# Patient Record
Sex: Female | Born: 1955 | Race: Black or African American | Hispanic: No | Smoking: Former smoker
Health system: Southern US, Community
[De-identification: ages and names within clinical notes are randomized; demographics above are authoritative.]

## PROBLEM LIST (undated history)

## (undated) DIAGNOSIS — E785 Hyperlipidemia, unspecified: Secondary | ICD-10-CM

## (undated) DIAGNOSIS — I10 Essential (primary) hypertension: Secondary | ICD-10-CM

## (undated) DIAGNOSIS — E669 Obesity, unspecified: Secondary | ICD-10-CM

## (undated) DIAGNOSIS — R7401 Elevation of levels of liver transaminase levels: Secondary | ICD-10-CM

## (undated) DIAGNOSIS — R740 Nonspecific elevation of levels of transaminase and lactic acid dehydrogenase [LDH]: Secondary | ICD-10-CM

## (undated) DIAGNOSIS — T7840XA Allergy, unspecified, initial encounter: Secondary | ICD-10-CM

## (undated) DIAGNOSIS — U071 COVID-19: Secondary | ICD-10-CM

## (undated) HISTORY — PX: BREAST SURGERY: SHX581

## (undated) HISTORY — DX: Essential (primary) hypertension: I10

## (undated) HISTORY — DX: Elevation of levels of liver transaminase levels: R74.01

## (undated) HISTORY — DX: COVID-19: U07.1

## (undated) HISTORY — DX: Allergy, unspecified, initial encounter: T78.40XA

## (undated) HISTORY — DX: Nonspecific elevation of levels of transaminase and lactic acid dehydrogenase (ldh): R74.0

## (undated) HISTORY — DX: Hyperlipidemia, unspecified: E78.5

## (undated) HISTORY — DX: Obesity, unspecified: E66.9

## (undated) HISTORY — PX: ECTOPIC PREGNANCY SURGERY: SHX613

## (undated) NOTE — *Deleted (*Deleted)
[facility] Management Pharm[facility]  Name: Courtney Anderson  MRN: [id number] DOB: Apr 03, 1956   Chief Complaint/ HPI  Courtney Anderson,  68 y.o. , female presents for their Initial CCM visit with the clinical pharmacist via telephone.  PCP : Steele Sizer, MD Patient Care Team: Steele Sizer, MD as PCP - General (Family Medicine) Gustavus Bryant, LCSW as Social Worker (Licensed Clinical Social Worker) Lajean Manes, [facility] (Pharmacist)  Their chronic conditions include: Hypertension, Hyperlipidemia, Atrial Fibrillation and Vitamin d deficiency, Hyperthyroidism with goiter  ARF due to COVID 19  Office Visits: 09/06/20- Aura Dials, NP-  BP 146/90 olmesartan started, wound clinic referral   Consult Visit: ***  Allergies  Allergen Reactions  . Codeine     Medications: Outpatient Encounter Medications as of 09/08/2020  Medication Sig  . amiodarone (PACERONE) 200 MG tablet Take 1 tablet (200 mg total) by mouth daily.  Marland Kitchen apixaban (ELIQUIS) 5 MG TABS tablet Take 5 mg by mouth 2 (two) times daily.  Marland Kitchen aspirin 81 MG tablet Take 81 mg by mouth daily.  . Cholecalciferol (VITAMIN D) 50 MCG (2000 UT) tablet Take by mouth.  . Multiple Vitamin (MULTIVITAMIN) tablet Take 1 tablet by mouth daily.  Marland Kitchen nystatin (MYCOSTATIN/NYSTOP) powder Apply topically 3 (three) times daily.  Marland Kitchen olmesartan (BENICAR) 20 MG tablet Take 1 tablet (20 mg total) by mouth daily.  . rivaroxaban (XARELTO) 20 MG TABS tablet Take 1 tablet (20 mg total) by mouth daily with supper.   No facility-administered encounter medications on file as of 09/08/2020.    Wt Readings from Last 3 Encounters:  08/28/20 218 lb 3.2 oz (99 kg)  10/22/19 242 lb (109.8 kg)  01/11/19 200 lb (90.7 kg)    Current Diagnosis/Assessment:    Goals Addressed   None     {CHL HP Upstream Pharmacy Diagnosis/Assessment:[phone]} Hypertension   BP goal is:  {CHL HP UPSTREAM Pharmacist BP ranges:[phone]}  Office blood pressures are   BP Readings from Last 3 Encounters:  09/06/20 (!) 146/90  08/28/20 128/83  10/22/19 121/88   BMP Latest Ref Rng & Units 08/28/2020 08/27/2020 08/26/2020  Glucose 70 - 99 mg/dL - 952(W) 413(K)  BUN 8 - 23 mg/dL - 8 6(L)  Creatinine 4.40 - 1.00 mg/dL - 1.02(V) 2.53(G)  BUN/Creat Ratio 12 - 28 - - -  Sodium 135 - 145 mmol/L - 137 137  Potassium 3.5 - 5.1 mmol/L 4.0 3.4(L) 3.4(L)  Chloride 98 - 111 mmol/L - 107 105  CO2 22 - 32 mmol/L - 24 23  Calcium 8.9 - 10.3 mg/dL - 9.9 10.4(H)    Patient checks BP at home {CHL HP BP Monitoring [phone]} Patient home BP readings are ranging: ***  Patient has failed these meds in the past: *** Patient is currently {CHL Controlled/Uncontrolled:[phone]} on the following medications:  . Olmesartan 20 mg qd  We discussed {CHL HP Upstream Pharmacy discussion:[phone]}  Plan  Continue {CHL HP Upstream Pharmacy UYQIH:[id number]}     AFIB/RVR   Patient is currently rhythm controlled. Office heart rates are  Pulse Readings from Last 3 Encounters:  09/06/20 67  08/28/20 74  10/22/19 76    CHA2DS2-VASc Score =    The patient's score is based upon:   {This patient does not have a recorded CHADS2-VASc score.   Click here to calculate score.   Then [facility] your note.       :[phone]}     Patient has failed these meds in past: NA Patient  is currently query  controlled on the following medications:  . Xarelto 20 mg qd . Amiodarone 200 mg qd  We discussed:  New diagnosis while hospitalized for COVID.  Plan  Continue {CHL HP Upstream Pharmacy [phone]}     Osteopenia / Osteoporosis   Last DEXA Scan: Unavailable   T-Score femoral neck: ***  T-Score total hip: ***  T-Score lumbar spine: ***  T-Score forearm radius: ***  10-year probability of major osteoporotic fracture: ***  10-year probability of hip fracture: ***  Vit D, 25-Hydroxy  Date Value Ref Range Status  10/22/2019 26.4 (L) 30.0 - 100.0 ng/mL  Final    Comment:    Vitamin D deficiency has been defined by the Institute of Medicine and an Endocrine Society practice guideline as a level of serum 25-OH vitamin D less than 20 ng/mL (1,2). The Endocrine Society went on to further define vitamin D insufficiency as a level between 21 and 29 ng/mL (2). 1. IOM (Institute of Medicine). 2010. Dietary reference    intakes for calcium and D. [place]: The    [employer]. 2. Holick MF, Binkley [place], Bischoff-Ferrari HA, et al.    Evaluation, treatment, and prevention of vitamin D    deficiency: an Endocrine Society clinical practice    guideline. JCEM. 2011 Jul; 96(7):1911-30.   H/O hypercalcemia/hyperthyroidism treated with RAI 10/23/18   Patient {is;is not an osteoporosis candidate:23886}  Patient has failed these meds in past: *** Patient is currently {CHL Controlled/Uncontrolled:[id number]} on the following medications:  . ***  We discussed:  {Osteoporosis Counseling:23892}  Plan  Continue {CHL HP Upstream Pharmacy VHQIO:[id number]}     Medication Management   Pt uses [facility]  pharmacy for all medications Uses pill box? {Yes or If no, why not?:20788} Pt endorses ***% compliance  We discussed: {Pharmacy options:24294}  Plan  {US Pharmacy KGMW:10272}    Follow up: *** month phone visit  ***

## (undated) NOTE — *Deleted (*Deleted)
CRITICAL CARE PROGRESS NOTE    Name: Courtney Anderson MRN: [id number] DOB: [birth year]     LOS: 25   SUBJECTIVE FINDINGS & SIGNIFICANT EVENTS    Patient description:  Reason for Consult: Severe respiratory distress and patient with COVID-19 pneumonia. Referring Physician: Irena Cords, MD  Courtney Anderson is an 7 y.o. female.  HPI: Patient is a 34 year old female with history noted below, who was admitted to [facility] on 2020-07-24 brought in via EMS as a "code sepsis".  Patient subsequently tested positive for COVID-19.  She presented with oxygen saturations of 69% on room air and had to be placed on nonrebreather mask to achieve oxygen saturations in the 90s.  She was subsequently admitted to the Covid unit for management of COVID-19 pneumonia.  She was noted to have acute kidney injury she was treated with remdesivir, zinc, vitamin C and antitussives.  She was placed empirically on Rocephin and azithromycin.  She continued to have increased FiO2 requirements.  She was refusing to self prone.  She had atrial fibrillation and was seen by cardiology to assist in management.   07/31/20- patient is on 90% FiO2, will modify meds as patient is on metoprolol and levophed as well as cardizem.  Will d/c propofol to improve SBP and remove arythmogenic vasopressor if able.  Plan to have CVP trending and diuresis today.   Goals of care discussion today - patient made DNR per sister and son of patient. I had 2 separate phone meetings with son and separate conversation with sister today to explain patient is worse with shock and renal failure requiring multiple pressors.   08/01/20- patient remains critically ill. FiO2 has been weaned down to 80% today.  08/03/20- patient remains critically ill shes being proned, weaning FiO2 as able.   08/04/20- patient with no interval changes continue with weaning and proning protocol.  Continue weaning FiO2 and PEEP as to optimize for SBT 08/06/20- patient is weaned down 40%FiO2 today, will attempt awakening trial with SBT, I spoke to son Mr Lorenda Hatchet today, he appreciates update and does not want Elink video view at this time.  08/07/20- patient is requiring 50%FiO2 today 08/08/20- Continuing to wean vent. FiO2 is 28%, will trial SBT today 08/09/20-tachypneic and hypertensive with SBT. Asynchronous.FiO2 back up to 50% 10/7 severeresp failure, attempt SAT/SBT 10/7 extubated to biPAP 10/8 Tachycardic , more confused,patient re-intubated ETT 8.0 10/9 remains on vent 10/11 severe resp failure on vent 10/12 failed weaning trial 10/12 successfully extubated 10/14 TRANSFER TO TRH 10/15- passed speech and swallow but then had aspiration event. Despite this we were able towean her HFNC and plan to transition to nasal canula.  She is high risk for aspiration still.  Wounds around torso - moisture associated and torso/abdomen/inguinal/sacral/intertriginous.  Will reconsult wound care and will consider PICC for TPN. Will discuss with family PEG/OG/TPN.***   Lines/tubes : Airway 7.5 mm (Active)  Secured at (cm) 23 cm 07/31/20 0806  Measured From Lips 07/31/20 0806  Secured Location Center 07/31/20 0806  Secured By [employer] 07/31/20 0806  Tube Holder Repositioned Yes 07/31/20 0806  Cuff Pressure (cm H2O) 26 cm H2O 07/31/20 0806  Site Condition Dry 07/31/20 0806     CVC Triple Lumen 07/30/20 Left Internal jugular (Active)  Indication for Insertion or Continuance of Line Vasoactive infusions 07/30/20 2010  Site Assessment Clean;Dry;Intact 07/30/20 2010  Proximal Lumen Status Infusing 07/30/20 2010  Medial Lumen Status Infusing 07/30/20 2010  Distal Lumen Status Infusing 07/30/20 2010  Dressing Type Transparent;Occlusive 07/30/20 2010  Dressing Status Clean;Dry;Intact 07/30/20 2010   Line Care Connections checked and tightened 07/30/20 2010  Dressing Change Due 08/06/20 07/30/20 2010     NG/OG Tube O[facility] mouth Xray Documented cm marking at nare/ corner of mouth 75 cm (Active)  Cm Marking at Nare/Corner of Mouth (if applicable) 75 cm 07/30/20 2010  Site Assessment Clean;Dry;Intact 07/30/20 2010  Ongoing Placement Verification No change in cm markings or external length of tube from initial placement;No acute changes, not attributed to clinical condition;No change in respiratory status;Xray 07/30/20 2010  Status Suction-low intermittent 07/30/20 2010  Amount of suction 80 mmHg 07/30/20 2010  Drainage Appearance Manson Passey;Coffee ground 07/30/20 2010     Urethral Catheter Awanda Mink Double-lumen;Latex 16 Fr. (Active)  Indication for Insertion or Continuance of Catheter Unstable critically ill patients first 24-48 hours (See Criteria) 07/31/20 0752  Site Assessment Clean;Intact 07/30/20 2000  Catheter Maintenance Bag below level of bladder;Catheter secured;Drainage bag/tubing not touching floor;Insertion date on drainage bag;No dependent loops;Seal intact 07/30/20 2000  Collection Container Standard drainage bag 07/30/20 2000  Securement Method Securing device (Describe) 07/30/20 2000  Urinary Catheter Interventions (if applicable) Unclamped 07/30/20 2000  Output (mL) 75 mL 07/31/20 0600    Microbiology/Sepsis markers: Results for orders placed or performed during the hospital encounter of 07/24/20  Urine culture     Status: Abnormal   Collection Time: 07/24/20  9:12 AM   Specimen: Urine, Random  Result Value Ref Range Status   Specimen Description   Final    URINE, RANDOM Performed at [facility], [address] [place], [place] [address]    Special Requests   Final    NONE Performed at [facility], [address], [place], [place] [address]    Culture (A)  Final    <10,000 COLONIES/mL >=100,000 COLONIES/mL Performed at [facility], [address] [place], [place] [address]    Report Status 07/25/2020 FINAL  Final  Blood culture (routine single)     Status: None   Collection Time: 07/24/20  9:14 AM   Specimen: BLOOD LEFT ARM  Result Value Ref Range Status   Specimen Description BLOOD LEFT ARM  Final   Special Requests   Final    BOTTLES DRAWN AEROBIC AND ANAEROBIC Blood Culture adequate volume   Culture   Final    NO GROWTH 5 DAYS Performed at [facility], [address] [place], [place] [address]    Report Status 07/29/2020 FINAL  Final  SARS Coronavirus 2 by RT PCR (hospital order, performed in [facility] hospital lab) Nasopharyngeal Nasopharyngeal Swab     Status: Abnormal   Collection Time: 07/24/20  9:16 AM   Specimen: Nasopharyngeal Swab  Result Value Ref Range Status   SARS Coronavirus 2 POSITIVE (A) NEGATIVE Final    Comment: RESULT CALLED TO, READ BACK BY AND VERIFIED WITH:  SAMANTHA HAMILTON AT 1049 07/24/20 SDR (NOTE) SARS-CoV-2 target nucleic acids are DETECTED  SARS-CoV-2 RNA is generally detectable in upper respiratory specimens  during the acute phase of infection.  Positive results are indicative  of the presence of the identified virus, but do not rule out bacterial infection or co-infection with other pathogens not detected by the test.  Clinical correlation with patient history and  other diagnostic information is necessary to determine patient infection status.  The expected result is negative.  Fact Sheet for Patients:   BoilerBrush.com.cy   Fact Sheet for Healthcare Providers:  https://pope.com/    This test is not yet approved or cleared by the [place] and  has been authorized for detection and/or diagnosis of SARS-CoV-2 by FDA under an Emergency Use Authorization (EUA).  This EUA will remain in effect (meaning thi s test can be used) for the duration of  the COVID-19 declaration under Section  564(b)(1) of the Act, 21 U.S.C. section 360-bbb-3(b)(1), unless the authorization is terminated or revoked sooner.  Performed at [facility], [address] [place], [place] [address]   MRSA PCR Screening     Status: None   Collection Time: 07/30/20 11:08 AM   Specimen: Nasopharyngeal  Result Value Ref Range Status   MRSA by PCR NEGATIVE NEGATIVE Final    Comment:        The GeneXpert MRSA Assay (FDA approved for NASAL specimens only), is one component of a comprehensive MRSA colonization surveillance program. It is not intended to diagnose MRSA infection nor to guide or monitor treatment for MRSA infections. Performed at [facility], [address] [place], [place] [address]   Culture, respiratory (non-expectorated)     Status: None   Collection Time: 08/01/20 10:46 AM   Specimen: Tracheal Aspirate; Respiratory  Result Value Ref Range Status   Specimen Description   Final    TRACHEAL ASPIRATE Performed at [facility] [address] [place], [place] [address]    Special Requests   Final    NONE Performed at [facility], [address] [place], [place] [address]    Gram Stain NO WBC SEEN NO ORGANISMS SEEN   Final   Culture   Final    RARE Normal respiratory flora-no Staph aureus or Pseudomonas seen Performed at [facility], [address] [place], [place] [address]    Report Status 08/03/2020 FINAL  Final    Anti-infectives:  Anti-infectives (From admission, onward)   Start     Dose/Rate Route Frequency Ordered Stop   08/18/20 0100  Ampicillin-Sulbactam (UNASYN) 3 g in sodium chloride 0.9 % 100 mL IVPB        3 g 200 mL/hr over 30 Minutes Intravenous Every 6 hours 08/18/20 0004     08/10/20 1600  fluconazole (DIFLUCAN) IVPB 200 mg        200 mg 100 mL/hr over 60 Minutes Intravenous Every 24 hours 08/10/20 1304 08/17/20 1755   08/01/20 1200  ceFEPIme (MAXIPIME) 2 g in sodium chloride 0.9 % 100 mL IVPB   Status:  Discontinued        2 g 200 mL/hr over 30 Minutes Intravenous Every 12 hours 08/01/20 1026 08/03/20 1014   07/25/20 1000  azithromycin (ZITHROMAX) 500 mg in sodium chloride 0.9 % 250 mL IVPB  Status:  Discontinued        500 mg 250 mL/hr over 60 Minutes Intravenous Every 24 hours 07/24/20 1108 07/24/20 1317   07/25/20 1000  remdesivir 100 mg in sodium chloride 0.9 % 100 mL IVPB       "Followed by" Linked Group Details   100 mg 200 mL/hr over 30 Minutes Intravenous Daily 07/24/20 1112 07/28/20 1544   07/24/20 1300  fluconazole (DIFLUCAN) tablet 100 mg  Status:  Discontinued        100 mg Oral Daily 07/24/20 1251 07/25/20 1522   07/24/20 1200  remdesivir 200 mg in sodium chloride 0.9% 250 mL IVPB       "Followed by" Linked Group Details   200 mg 580 mL/hr  over 30 Minutes Intravenous Once 07/24/20 1112 07/24/20 1424   07/24/20 1115  cefTRIAXone (ROCEPHIN) 2 g in sodium chloride 0.9 % 100 mL IVPB  Status:  Discontinued        2 g 200 mL/hr over 30 Minutes Intravenous Every 24 hours 07/24/20 1108 07/24/20 1113   07/24/20 1000  cefTRIAXone (ROCEPHIN) 2 g in sodium chloride 0.9 % 100 mL IVPB  Status:  Discontinued        2 g 200 mL/hr over 30 Minutes Intravenous Every 24 hours 07/24/20 0947 07/28/20 1438   07/24/20 1000  azithromycin (ZITHROMAX) 500 mg in sodium chloride 0.9 % 250 mL IVPB  Status:  Discontinued        500 mg 250 mL/hr over 60 Minutes Intravenous Every 24 hours 07/24/20 0947 07/28/20 1438       Consults: Treatment Team:  Alwyn Pea, MD Laurier Nancy, MD Marcina Millard, MD     PAST MEDICAL HISTORY   Past Medical History:  Diagnosis Date  . Allergy   . Elevated transaminase level   . Hyperlipidemia   . Hypertension   . Obesity      SURGICAL HISTORY   Past Surgical History:  Procedure Laterality Date  . BREAST SURGERY    . ECTOPIC PREGNANCY SURGERY       FAMILY HISTORY   Family History  Problem Relation Age of Onset  .  Hypertension Father   . Stroke Father   . Hypertension Mother   . Arthritis Mother   . Aneurysm Sister   . Hypertension Sister   . Hypertension Brother   . Hypertension Sister   . Hypertension Brother      SOCIAL HISTORY   Social History   Tobacco Use  . Smoking status: Former Games developer  . Smokeless tobacco: Never Used  Substance Use Topics  . Alcohol use: Yes    Alcohol/week: 0.0 standard drinks    Comment: on occasion  . Drug use: No     MEDICATIONS   Current Medication:  Current Facility-Administered Medications:  .  0.9 %  sodium chloride infusion, , Intravenous, PRN, Erin Fulling, MD, Stopped at 08/12/20 1532 .  acetaminophen (TYLENOL) tablet 650 mg, 650 mg, Oral, Q6H PRN, 650 mg at 08/09/20 2127 **OR** acetaminophen (TYLENOL) suppository 650 mg, 650 mg, Rectal, Q6H PRN, Agbata, Tochukwu, MD .  amiodarone (NEXTERONE PREMIX) 360-4.14 MG/200ML-% (1.8 mg/mL) IV infusion, 30 mg/hr, Intravenous, Continuous, Harlon Ditty D, NP, Last Rate: 16.67 mL/hr at 08/18/20 0625, 30 mg/hr at 08/18/20 0625 .  Ampicillin-Sulbactam (UNASYN) 3 g in sodium chloride 0.9 % 100 mL IVPB, 3 g, Intravenous, Q6H, Hall, Scott A, RPH, Last Rate: 200 mL/hr at 08/18/20 0757, 3 g at 08/18/20 0757 .  ascorbic acid (VITAMIN C) tablet 500 mg, 500 mg, Per Tube, Daily, Camree Wigington, MD, 500 mg at 08/15/20 0902 .  aspirin chewable tablet 81 mg, 81 mg, Per Tube, Daily, Karna Christmas, Jaquille Kau, MD, 81 mg at 08/15/20 0902 .  chlorhexidine (PERIDEX) 0.12 % solution 15 mL, 15 mL, Mouth Rinse, BID, Kasa, Kurian, MD, 15 mL at 08/18/20 0119 .  Chlorhexidine Gluconate Cloth 2 % PADS 6 each, 6 each, Topical, Daily, Erin Fulling, MD, 6 each at 08/18/20 0119 .  cholecalciferol (VITAMIN D3) tablet 2,000 Units, 2,000 Units, Per Tube, Daily, Vida Rigger, MD, 2,000 Units at 08/15/20 0902 .  digoxin (LANOXIN) tablet 0.25 mg, 0.25 mg, Oral, Daily, Amery, Sahar, MD .  famotidine (PEPCID) IVPB 20 mg premix, 20 mg, Intravenous,  Jaynie Bream, MD, Last Rate: 100 mL/hr at 08/17/20 2117, 20 mg at 08/17/20 2117 .  feeding supplement (ENSURE ENLIVE / ENSURE PLUS) liquid 237 mL, 237 mL, Oral, TID BM, Kasa, Kurian, MD .  guaiFENesin-dextromethorphan (ROBITUSSIN DM) 100-10 MG/5ML syrup 10 mL, 10 mL, Oral, Q4H PRN, Agbata, Tochukwu, MD .  haloperidol lactate (HALDOL) injection 5 mg, 5 mg, Intravenous, Q6H PRN, Irena Cords V, MD, 5 mg at 08/10/20 2338 .  heparin ADULT infusion 100 units/mL (25000 units/259mL sodium chloride 0.45%), 1,350 Units/hr, Intravenous, Continuous, Amery, Sahar, MD, Last Rate: 13.5 mL/hr at 08/17/20 2250, 1,350 Units/hr at 08/17/20 2250 .  HYDROcodone-acetaminophen (NORCO/VICODIN) 5-325 MG per tablet 1 tablet, 1 tablet, Oral, Q6H PRN, Jimmye Norman, NP, 1 tablet at 08/08/20 1730 .  insulin aspart (novoLOG) injection 0-9 Units, 0-9 Units, Subcutaneous, Q4H, Eugenie Norrie, NP, 1 Units at 08/18/20 0753 .  magnesium sulfate IVPB 2 g 50 mL, 2 g, Intravenous, Once, Harlon Ditty D, NP .  MEDLINE mouth rinse, 15 mL, Mouth Rinse, q12n4p, Kasa, Kurian, MD .  metoprolol tartrate (LOPRESSOR) injection 2.5-5 mg, 2.5-5 mg, Intravenous, Q2H PRN, Harlon Ditty D, NP, 5 mg at 08/14/20 1434 .  multivitamin with minerals tablet 1 tablet, 1 tablet, Per Tube, Daily, Vida Rigger, MD, 1 tablet at 08/15/20 0902 .  nystatin (MYCOSTATIN/NYSTOP) topical powder, , Topical, TID, Hughie Closs, MD, Given at 08/17/20 2120 .  ondansetron (ZOFRAN) tablet 4 mg, 4 mg, Oral, Q6H PRN **OR** ondansetron (ZOFRAN) injection 4 mg, 4 mg, Intravenous, Q6H PRN, Agbata, Tochukwu, MD, 4 mg at 08/11/20 1316 .  pneumococcal 23 valent vaccine (PNEUMOVAX-23) injection 0.5 mL, 0.5 mL, Intramuscular, Tomorrow-1000, Pahwani, Ravi, MD .  potassium chloride 10 mEq in 100 mL IVPB, 10 mEq, Intravenous, Q1 Hr x 6, Harlon Ditty D, NP, Last Rate: 100 mL/hr at 08/18/20 0802, 10 mEq at 08/18/20 0802 .  zinc sulfate capsule 220 mg, 220 mg,  Per Tube, Daily, Vida Rigger, MD, 220 mg at 08/15/20 0902    ALLERGIES   Codeine    REVIEW OF SYSTEMS    Unable to perform due to Sedation on MV  PHYSICAL EXAMINATION   Vital Signs: Temp:  [96.5 F (35.8 C)-97.8 F (36.6 C)] 97 F (36.1 C) (10/15 0400) Pulse Rate:  [84-119] 94 (10/15 0630) Resp:  [17-35] 25 (10/15 0630) BP: (137-169)/(79-103) 143/86 (10/15 0600) SpO2:  [82 %-100 %] 100 % (10/15 0630) FiO2 (%):  [80 %-90 %] 80 % (10/15 0400)  GENERAL:intubated on MV age appropriate HEAD: Normocephalic, atraumatic.  EYES: Pupils equal, round, reactive to light.  No scleral icterus.  MOUTH: Moist mucosal membrane. NECK: Supple. No thyromegaly. No nodules. No JVD.  PULMONARY: crackles at bases difficult to appreciate due to MV CARDIOVASCULAR: S1 and S2. Regular rate and rhythm. No murmurs, rubs, or gallops.  GASTROINTESTINAL: Soft, nontender, non-distended. No masses. Positive bowel sounds. No hepatosplenomegaly.  MUSCULOSKELETAL: No swelling, clubbing, or edema.  NEUROLOGIC: Mild distress due to acute illness SKIN:intact,warm,dry   PERTINENT DATA     Infusions: . sodium chloride Stopped (08/12/20 1532)  . amiodarone 30 mg/hr (08/18/20 0625)  . ampicillin-sulbactam (UNASYN) IV 3 g (08/18/20 0757)  . famotidine (PEPCID) IV 20 mg (08/17/20 2117)  . heparin 1,350 Units/hr (08/17/20 2250)  . magnesium sulfate bolus IVPB    . potassium chloride 10 mEq (08/18/20 0802)   Scheduled Medications: . vitamin C  500 mg Per Tube Daily  . aspirin  81 mg Per Tube Daily  . chlorhexidine  15 mL Mouth Rinse BID  . Chlorhexidine Gluconate Cloth  6 each Topical Daily  . cholecalciferol  2,000 Units Per Tube Daily  . digoxin  0.25 mg Oral Daily  . feeding supplement  237 mL Oral TID BM  . insulin aspart  0-9 Units Subcutaneous Q4H  . mouth rinse  15 mL Mouth Rinse q12n4p  . multivitamin with minerals  1 tablet Per Tube Daily  . nystatin   Topical TID  . pneumococcal 23  valent vaccine  0.5 mL Intramuscular Tomorrow-1000  . zinc sulfate  220 mg Per Tube Daily   PRN Medications: sodium chloride, acetaminophen **OR** acetaminophen, guaiFENesin-dextromethorphan, haloperidol lactate, HYDROcodone-acetaminophen, metoprolol tartrate, ondansetron **OR** ondansetron (ZOFRAN) IV Hemodynamic parameters:   Intake/Output: 10/14 0701 - 10/15 0700 In: 651.6 [P.O.:200; I.V.:280.3; IV Piggyback:171.2] Out: 2775 [Urine:2775]  Ventilator  Settings: FiO2 (%):  [80 %-90 %] 80 %     LAB RESULTS:  Basic Metabolic Panel: Recent Labs  Lab 08/14/20 0413 08/14/20 0413 08/15/20 0515 08/15/20 0515 08/16/20 0456 08/16/20 2222 08/17/20 0509 08/18/20 0441  NA 146*  --  141  --  139  --  137 138  K 3.5   < > 2.9*   < > 2.8*   < > 3.2* 2.7*  CL 106  --  100  --  97*  --  94* 93*  CO2 30  --  29  --  31  --  30 28  GLUCOSE 113*  --  100*  --  88  --  90 131*  BUN 17  --  12  --  10  --  8 8  CREATININE 0.92  --  0.97  --  0.88  --  0.89 0.91  CALCIUM 9.4  --  9.5  --  9.3  --  9.2 9.6  MG 1.8  --  1.9  --  1.8  --  1.9 1.5*  PHOS 2.8  --  2.5  --  2.1*  --  2.1* 3.3   < > = values in this interval not displayed.   Liver Function Tests: Recent Labs  Lab 08/14/20 0413 08/15/20 0515 08/16/20 0456 08/17/20 0509 08/18/20 0441  AST 23 27 26 28 28   ALT 39 35 29 30 29   ALKPHOS 55 60 58 66 69  BILITOT 0.7 1.1 1.1 1.4* 1.4*  PROT 6.0* 6.3* 6.0* 6.9 7.1  ALBUMIN 2.6* 2.7* 2.5* 2.7* 3.0*   No results for input(s): LIPASE, AMYLASE in the last 168 hours. No results for input(s): AMMONIA in the last 168 hours. CBC: Recent Labs  Lab 08/12/20 0523 08/12/20 0523 08/13/20 0405 08/13/20 0405 08/14/20 0413 08/15/20 0515 08/16/20 0616 08/17/20 0509 08/18/20 0441  WBC 7.4   < > 6.4   < > 7.1 7.2 6.6 6.4 10.1  NEUTROABS 5.8  --  4.7  --  5.8 5.1 4.6  --   --   HGB 10.0*   < > 10.5*   < > 10.8* 10.9* 11.4* 11.0* 11.8*  HCT 32.2*   < > 34.3*   < > 35.8* 34.9* 35.9*  33.9* 36.6  MCV 91.5   < > 90.7   < > 91.1 89.5 87.3 86.7 84.5  PLT 153   < > 144*   < > 158 155 167 163 138*   < > = values in this interval not displayed.   Cardiac Enzymes: No results for input(s): CKTOTAL, CKMB, CKMBINDEX, TROPONINI in the last 168 hours. BNP: Invalid input(s): POCBNP  CBG: Recent Labs  Lab 08/17/20 1828 08/17/20 2000 08/17/20 2359 08/18/20 0444 08/18/20 0730  GLUCAP 106* 106* 115* 136* 123*       IMAGING RESULTS:  Imaging: DG Chest Port 1 View  Result Date: 08/18/2020 CLINICAL DATA:  Aspiration EXAM: PORTABLE CHEST 1 VIEW COMPARISON:  08/11/2020 FINDINGS: Endotracheal tube is no longer present. Unchanged position of left IJ central venous catheter. Bilateral airspace opacities are unchanged. Small pleural effusions. IMPRESSION: Unchanged bilateral airspace opacities. Electronically Signed   By: Deatra Robinson M.D.   On: 08/18/2020 00:53    ASSESSMENT AND PLAN    -Multidisciplinary rounds held today Acute respiratory failure with hypoxia due to COVID-19 ARDS COVID-19 pneumonia mechanical ventilation ARDS protocol, 6 cc/kg of PBW Recruitment maneuvers as necessary Prone positioning 18/6 ratio Completed remdesivir Continue Rocephin and azithromycin for now  Atrial fibrillation with RVR Due to physiologic stress/hypoxia Hx: Hyperthyroidism treated with RAI Continue amiodarone and calcium channel blocker Cardiology following Continue cardiac monitoring Correct electrolytes as needed Heparin infusion  Severe intertrigo Multiple skin breakdown areas Wound care following  ID -continue IV abx as prescibed -follow up cultures  GI/Nutrition GI PROPHYLAXIS as indicated DIET-->TF's as tolerated Constipation protocol as indicated  ENDO - ICU hypoglycemic\Hyperglycemia protocol -check FSBS per protocol   ELECTROLYTES -follow labs as needed -replace as needed -pharmacy consultation   DVT/GI PRX ordered -SCDs  TRANSFUSIONS AS NEEDED  MONITOR FSBS ASSESS the need for LABS as needed   Critical care provider statement:    Critical care time (minutes):  33   Critical care time was exclusive of:  Separately billable procedures and treating other patients   Critical care was necessary to treat or prevent imminent or life-threatening deterioration of the following conditions:  Acute hypoxemic respiratory failure, multiple comoribid conditions.    Critical care was time spent personally by me on the following activities:  Development of treatment plan with patient or surrogate, discussions with consultants, evaluation of patient's response to treatment, examination of patient, obtaining history from patient or surrogate, ordering and performing treatments and interventions, ordering and review of laboratory studies and re-evaluation of patient's condition.  I assumed direction of critical care for this patient from another provider in my specialty: no    This document was prepared using Dragon voice recognition software and may include unintentional dictation errors.    Vida Rigger, M.D.  Division of Pulmonary & Critical Care Medicine  [facility]

---

## 2001-11-02 ENCOUNTER — Ambulatory Visit (HOSPITAL_COMMUNITY): Admission: RE | Admit: 2001-11-02 | Discharge: 2001-11-02 | Payer: Self-pay | Admitting: Pediatrics

## 2001-11-02 ENCOUNTER — Encounter: Payer: Self-pay | Admitting: Pediatrics

## 2002-09-14 ENCOUNTER — Ambulatory Visit (HOSPITAL_COMMUNITY): Admission: RE | Admit: 2002-09-14 | Discharge: 2002-09-14 | Payer: Self-pay | Admitting: Pediatrics

## 2002-09-14 ENCOUNTER — Encounter: Payer: Self-pay | Admitting: Pediatrics

## 2003-06-06 ENCOUNTER — Encounter: Payer: Self-pay | Admitting: Pediatrics

## 2003-06-06 ENCOUNTER — Ambulatory Visit (HOSPITAL_COMMUNITY): Admission: RE | Admit: 2003-06-06 | Discharge: 2003-06-06 | Payer: Self-pay | Admitting: Pediatrics

## 2003-12-19 ENCOUNTER — Ambulatory Visit (HOSPITAL_COMMUNITY): Admission: RE | Admit: 2003-12-19 | Discharge: 2003-12-19 | Payer: Self-pay | Admitting: Pediatrics

## 2004-08-13 ENCOUNTER — Ambulatory Visit (HOSPITAL_COMMUNITY): Admission: RE | Admit: 2004-08-13 | Discharge: 2004-08-13 | Payer: Self-pay | Admitting: Pediatrics

## 2004-10-08 ENCOUNTER — Ambulatory Visit (HOSPITAL_COMMUNITY): Admission: RE | Admit: 2004-10-08 | Discharge: 2004-10-08 | Payer: Self-pay | Admitting: Obstetrics and Gynecology

## 2006-10-23 ENCOUNTER — Ambulatory Visit: Payer: Self-pay | Admitting: Family Medicine

## 2007-10-26 ENCOUNTER — Ambulatory Visit: Payer: Self-pay | Admitting: Family Medicine

## 2008-10-26 ENCOUNTER — Ambulatory Visit: Payer: Self-pay | Admitting: Family Medicine

## 2008-11-21 ENCOUNTER — Emergency Department: Payer: Self-pay | Admitting: Emergency Medicine

## 2009-12-19 ENCOUNTER — Ambulatory Visit: Payer: Self-pay | Admitting: Family Medicine

## 2009-12-25 ENCOUNTER — Emergency Department: Payer: Self-pay | Admitting: Emergency Medicine

## 2015-04-12 ENCOUNTER — Other Ambulatory Visit: Payer: Self-pay

## 2015-04-12 MED ORDER — OLMESARTAN-AMLODIPINE-HCTZ 40-5-25 MG PO TABS: ORAL_TABLET | ORAL | Status: DC

## 2015-04-12 NOTE — Telephone Encounter (Signed)
Needs seen for further refills

## 2015-04-13 ENCOUNTER — Telehealth: Payer: Self-pay | Admitting: Unknown Physician Specialty

## 2015-04-13 NOTE — Telephone Encounter (Signed)
This was ordered and sent yesterday

## 2015-04-13 NOTE — Telephone Encounter (Signed)
Pt called states she needs a refill on Tribenzor. Pt states she is on  [employer] 12 hours shifts through her job and will not be able to schedule an appointment until the end of July or August. [facility] is [facility] [place]. Pt was last seen 09/2014. Please contact pt @ [phone] with any questions or concerns.

## 2015-05-15 ENCOUNTER — Telehealth: Payer: Self-pay | Admitting: Unknown Physician Specialty

## 2015-05-15 MED ORDER — OLMESARTAN-AMLODIPINE-HCTZ 40-5-25 MG PO TABS: ORAL_TABLET | ORAL | Status: DC

## 2015-05-15 NOTE — Telephone Encounter (Signed)
Pt called stated her medications are about to run out of her medications, wants to know if her medications can be called. Pt needs her BP medication. [facility] is [facility] in [place]. Thanks.

## 2015-05-15 NOTE — Telephone Encounter (Signed)
Routing to provider. Medication is Biomedical scientist, pharmacy is [facility] in [place], and practice partner number is [phone].

## 2015-05-15 NOTE — Telephone Encounter (Signed)
Pt stated her medication will run out Wednesday. Pt made an appt for 06/02/15 pt needs enough pills to last until the appt date.

## 2015-05-31 DIAGNOSIS — Z713 Dietary counseling and surveillance: Secondary | ICD-10-CM

## 2015-05-31 DIAGNOSIS — R740 Nonspecific elevation of levels of transaminase and lactic acid dehydrogenase [LDH]: Secondary | ICD-10-CM

## 2015-05-31 DIAGNOSIS — E785 Hyperlipidemia, unspecified: Secondary | ICD-10-CM

## 2015-05-31 DIAGNOSIS — I10 Essential (primary) hypertension: Secondary | ICD-10-CM

## 2015-05-31 DIAGNOSIS — R7401 Elevation of levels of liver transaminase levels: Secondary | ICD-10-CM

## 2015-06-02 ENCOUNTER — Encounter: Payer: Self-pay | Admitting: Unknown Physician Specialty

## 2015-06-02 ENCOUNTER — Ambulatory Visit (INDEPENDENT_AMBULATORY_CARE_PROVIDER_SITE_OTHER): Payer: BLUE CROSS/BLUE SHIELD | Admitting: Unknown Physician Specialty

## 2015-06-02 VITALS — BP 131/89 | HR 76 | Temp 98.1°F | Ht 65.6 in | Wt 260.2 lb

## 2015-06-02 DIAGNOSIS — I10 Essential (primary) hypertension: Secondary | ICD-10-CM

## 2015-06-02 LAB — LIPID PANEL PICCOLO, WAIVED
Chol/HDL Ratio Piccolo,Waive: 4.3 mg/dL
Cholesterol Piccolo, Waived: 182 mg/dL (ref <200)
HDL Chol Piccolo, Waived: 43 mg/dL — ABNORMAL LOW (ref >59)
LDL Chol Calc Piccolo Waived: 120 mg/dL — ABNORMAL HIGH (ref <100)
Triglycerides Piccolo,Waived: 93 mg/dL (ref <150)
VLDL Chol Calc Piccolo,Waive: 19 mg/dL (ref <30)

## 2015-06-02 LAB — BAYER DCA HB A1C WAIVED: HB A1C (BAYER DCA - WAIVED): 6 % (ref <7.0)

## 2015-06-02 LAB — MICROALBUMIN, URINE WAIVED
Creatinine, Urine Waived: 200 mg/dL (ref 10–300)
Microalb, Ur Waived: 10 mg/L (ref 0–19)
Microalb/Creat Ratio: <30 mg/g (ref <30)

## 2015-06-02 MED ORDER — OLMESARTAN-AMLODIPINE-HCTZ 40-5-25 MG PO TABS: ORAL_TABLET | ORAL | Status: DC

## 2015-06-02 NOTE — Progress Notes (Signed)
   BP 131/89 mmHg  Pulse 76  Temp(Src) 98.1 F (36.7 C)  Ht 5' 5.6" (1.666 m)  Wt 260 lb 3.2 oz (118.026 kg)  BMI 42.52 kg/m2  SpO2 97%  LMP  (LMP Unknown)   Subjective:    Patient ID: Courtney Anderson, female    DOB: [birth year], 59 y.o.   MRN: [id number]  HPI: Courtney Anderson is a 59 y.o. female  Chief Complaint  Patient presents with  . Hypertension    Relevant past medical, surgical, family and social history reviewed and updated as indicated. Interim medical history since our last visit reviewed. Allergies and medications reviewed and updated.  Hypertension This is a chronic (Recheck 131/84) problem. The problem is controlled (controlled outside the office). Pertinent negatives include no chest pain, headaches, palpitations, peripheral edema or shortness of breath. There are no compliance problems.    Obesity She is planning to lose weight.  Has lost before just by walking  Review of Systems  Respiratory: Negative for shortness of breath.   Cardiovascular: Negative for chest pain and palpitations.  Neurological: Negative for headaches.    Per HPI unless specifically indicated above     Objective:    BP 131/89 mmHg  Pulse 76  Temp(Src) 98.1 F (36.7 C)  Ht 5' 5.6" (1.666 m)  Wt 260 lb 3.2 oz (118.026 kg)  BMI 42.52 kg/m2  SpO2 97%  LMP  (LMP Unknown)  Wt Readings from Last 3 Encounters:  06/02/15 260 lb 3.2 oz (118.026 kg)  05/31/15 261 lb (118.389 kg)    Physical Exam  Constitutional: She is oriented to person, place, and time. She appears well-developed and well-nourished. No distress.  HENT:  Head: Normocephalic and atraumatic.  Eyes: Conjunctivae and lids are normal. Right eye exhibits no discharge. Left eye exhibits no discharge. No scleral icterus.  Cardiovascular: Normal rate, regular rhythm and normal heart sounds.   Pulmonary/Chest: Effort normal and breath sounds normal. No respiratory distress.  Abdominal: Normal appearance. There is no  splenomegaly or hepatomegaly.  Musculoskeletal: Normal range of motion.  Neurological: She is alert and oriented to person, place, and time.  Skin: Skin is intact. No rash noted. No pallor.  Psychiatric: She has a normal mood and affect. Her behavior is normal. Judgment and thought content normal.    No results found for this or any previous visit.    Assessment & Plan:   Problem List Items Addressed This Visit      Unprioritized   Hypertension - Primary    Continue present medications.        Relevant Medications   Olmesartan-Amlodipine-HCTZ (TRIBENZOR) 40-5-25 MG TABS   Other Relevant Orders   Lipid Panel Piccolo, Waived   Microalbumin, Urine Waived   Uric acid   Comprehensive metabolic panel   Morbid obesity    Will walk more and cut out sugar.        Relevant Orders   Bayer DCA Hb A1c Waived       Follow up plan: Return for physical October November.

## 2015-06-02 NOTE — Assessment & Plan Note (Signed)
Will walk more and cut out sugar.

## 2015-06-02 NOTE — Assessment & Plan Note (Signed)
Continue present medications. 

## 2015-06-03 LAB — COMPREHENSIVE METABOLIC PANEL
ALT: 49 IU/L — ABNORMAL HIGH (ref 0–32)
AST: 50 IU/L — ABNORMAL HIGH (ref 0–40)
Albumin/Globulin Ratio: 1.1 (ref 1.1–2.5)
Albumin: 4.2 g/dL (ref 3.5–5.5)
Alkaline Phosphatase: 96 IU/L (ref 39–117)
BUN/Creatinine Ratio: 19 (ref 9–23)
BUN: 13 mg/dL (ref 6–24)
Bilirubin Total: 0.3 mg/dL (ref 0.0–1.2)
CO2: 24 mmol/L (ref 18–29)
Calcium: 10.4 mg/dL — ABNORMAL HIGH (ref 8.7–10.2)
Chloride: 97 mmol/L (ref 97–108)
Creatinine, Ser: 0.69 mg/dL (ref 0.57–1.00)
GFR calc Af Amer: 110 mL/min/{1.73_m2} (ref >59)
GFR calc non Af Amer: 96 mL/min/{1.73_m2} (ref >59)
Globulin, Total: 3.7 g/dL (ref 1.5–4.5)
Glucose: 91 mg/dL (ref 65–99)
Potassium: 3.7 mmol/L (ref 3.5–5.2)
Sodium: 139 mmol/L (ref 134–144)
Total Protein: 7.9 g/dL (ref 6.0–8.5)

## 2015-06-03 LAB — URIC ACID: Uric Acid: 8.8 mg/dL — ABNORMAL HIGH (ref 2.5–7.1)

## 2015-06-05 ENCOUNTER — Encounter: Payer: Self-pay | Admitting: Unknown Physician Specialty

## 2015-06-13 ENCOUNTER — Other Ambulatory Visit: Payer: Self-pay | Admitting: Family Medicine

## 2016-02-08 ENCOUNTER — Telehealth: Payer: Self-pay | Admitting: Unknown Physician Specialty

## 2016-02-08 NOTE — Telephone Encounter (Signed)
Pt called needs refills on her blood pressure medication. Enough to last until her CPE on 03/22/16. [facility] [place]. Thanks.

## 2016-02-09 MED ORDER — OLMESARTAN-AMLODIPINE-HCTZ 40-5-25 MG PO TABS: ORAL_TABLET | ORAL | Status: DC

## 2016-03-22 ENCOUNTER — Encounter: Payer: BLUE CROSS/BLUE SHIELD | Admitting: Unknown Physician Specialty

## 2016-08-07 ENCOUNTER — Telehealth: Payer: Self-pay | Admitting: Unknown Physician Specialty

## 2016-08-07 NOTE — Telephone Encounter (Signed)
Tried calling patient and the first 3 numbers were wrong or disconnected. Her work number worked and a lady answered and stated that the patient was out on campus right now. She stated she did not know when she would see the patient again but she took the office name and phone number and stated she would have her call back when she could.

## 2016-08-07 NOTE — Telephone Encounter (Signed)
Called and let patient know that 90 day rx was at the pharmacy for her to pick up.

## 2016-08-07 NOTE — Telephone Encounter (Signed)
Pt called needs a refill on Tribenzor. [facility] [place]. Pt's CPE scheduled for 10/29/16. Pt needs enough to last until that appointment date.Thanks.

## 2016-08-07 NOTE — Telephone Encounter (Signed)
Called pharmacy because according to chart, patient should still have a refill. They stated that she did have a 90 day supply on file and they would go ahead and get it ready. This will be enough to get to December appointment so I will call the patient and let her know.

## 2016-09-13 ENCOUNTER — Encounter: Payer: Self-pay | Admitting: Unknown Physician Specialty

## 2016-10-09 ENCOUNTER — Telehealth: Payer: Self-pay | Admitting: Unknown Physician Specialty

## 2016-10-09 MED ORDER — OLMESARTAN-AMLODIPINE-HCTZ 40-5-25 MG PO TABS: ORAL_TABLET | ORAL | 1 refills | Status: DC

## 2016-10-09 NOTE — Telephone Encounter (Signed)
Please apologize to her.  I'm happy to fill her meds

## 2016-10-09 NOTE — Telephone Encounter (Signed)
Called and left patient a VM apologizing to her and I let her know that her medication was sent to the pharmacy for her.

## 2016-10-09 NOTE — Telephone Encounter (Signed)
Routing to provider  

## 2016-10-23 ENCOUNTER — Telehealth: Payer: Self-pay | Admitting: Unknown Physician Specialty

## 2016-10-23 NOTE — Telephone Encounter (Signed)
Called pharmacy because according to chart, patient should not be out of this medication. Pharmacy stated that they did receive the prescription on 10/09/16 and that the patient picked up a 20 day supply on 10/14/16 because that's what her insurance would allow. Pharmacy stated that the patient did have some still at the pharmacy ready to be picked up when she was due. Will call patient and let her know.

## 2016-10-23 NOTE — Telephone Encounter (Signed)
Pt called stated she needs an RX for Tribenzor sent to the pharmacy as she is almost out. [facility] [place]. Thanks.

## 2016-10-23 NOTE — Telephone Encounter (Signed)
Called and let patient know about medication.  

## 2016-10-25 ENCOUNTER — Encounter: Payer: BLUE CROSS/BLUE SHIELD | Admitting: Unknown Physician Specialty

## 2016-10-29 ENCOUNTER — Encounter: Payer: BLUE CROSS/BLUE SHIELD | Admitting: Unknown Physician Specialty

## 2016-11-05 ENCOUNTER — Telehealth: Payer: Self-pay | Admitting: Unknown Physician Specialty

## 2016-11-05 NOTE — Telephone Encounter (Signed)
Routing to provider  

## 2016-11-05 NOTE — Telephone Encounter (Signed)
Patient needs letter stating she has been taking Tribenzor for years and she cannot take the generic form.  She changed her ins coverage and beginning this year they require authorization for this medication.   She said the pharmacy faxed over something yesterday regarding this also.  Thanks Santiago Glad

## 2016-11-06 NOTE — Telephone Encounter (Signed)
OK to write letter but also due for a physical

## 2016-11-06 NOTE — Telephone Encounter (Signed)
Called pharmacy to see if this medication was needing a PA. The pharmacy tech I spoke with stated that this medication was picked up on 11/04/16 with no issues so she does not think it does. Will still type letter for patient and let her know it is ready for pick up.

## 2016-11-06 NOTE — Telephone Encounter (Signed)
Called and let patient know that letter was written and ready for pick up. Patient has a physical scheduled for 11/20/16.

## 2016-11-13 ENCOUNTER — Encounter: Payer: BLUE CROSS/BLUE SHIELD | Admitting: Unknown Physician Specialty

## 2016-11-20 ENCOUNTER — Encounter: Payer: Self-pay | Admitting: Unknown Physician Specialty

## 2016-11-21 ENCOUNTER — Encounter: Payer: Self-pay | Admitting: Unknown Physician Specialty

## 2016-12-02 ENCOUNTER — Telehealth: Payer: Self-pay | Admitting: Unknown Physician Specialty

## 2016-12-02 NOTE — Telephone Encounter (Signed)
PA Pending

## 2016-12-03 NOTE — Telephone Encounter (Signed)
Prior Authorization was approved. Pharmacy notified.

## 2016-12-19 ENCOUNTER — Ambulatory Visit (INDEPENDENT_AMBULATORY_CARE_PROVIDER_SITE_OTHER): Payer: BLUE CROSS/BLUE SHIELD | Admitting: Family Medicine

## 2016-12-19 VITALS — BP 152/85 | HR 91 | Ht 65.75 in | Wt 264.0 lb

## 2016-12-19 DIAGNOSIS — Z0000 Encounter for general adult medical examination without abnormal findings: Secondary | ICD-10-CM | POA: Diagnosis not present

## 2016-12-19 DIAGNOSIS — Z1159 Encounter for screening for other viral diseases: Secondary | ICD-10-CM

## 2016-12-19 DIAGNOSIS — Z1329 Encounter for screening for other suspected endocrine disorder: Secondary | ICD-10-CM | POA: Diagnosis not present

## 2016-12-19 DIAGNOSIS — E785 Hyperlipidemia, unspecified: Secondary | ICD-10-CM | POA: Diagnosis not present

## 2016-12-19 DIAGNOSIS — Z23 Encounter for immunization: Secondary | ICD-10-CM | POA: Diagnosis not present

## 2016-12-19 DIAGNOSIS — I10 Essential (primary) hypertension: Secondary | ICD-10-CM

## 2016-12-19 DIAGNOSIS — Z114 Encounter for screening for human immunodeficiency virus [HIV]: Secondary | ICD-10-CM

## 2016-12-19 MED ORDER — TRIAMCINOLONE ACETONIDE 0.1 % EX CREA: 1.0000 "application " | TOPICAL_CREAM | Freq: Two times a day (BID) | CUTANEOUS | 1 refills | Status: DC

## 2016-12-19 MED ORDER — OLMESARTAN-AMLODIPINE-HCTZ 40-10-25 MG PO TABS: 1.0000 | ORAL_TABLET | Freq: Every day | ORAL | 3 refills | Status: DC

## 2016-12-19 NOTE — Assessment & Plan Note (Signed)
Discuss wt loss diet exercise Pt has joined the [employer]

## 2016-12-19 NOTE — Addendum Note (Signed)
Addended by: Gerda Diss A on: 12/19/2016 02:53 PM   Modules accepted: Orders

## 2016-12-19 NOTE — Progress Notes (Signed)
BP (!) 152/85   Pulse 91   Ht 5' 5.75" (1.67 m)   Wt 264 lb (119.7 kg)   SpO2 99%   BMI 42.94 kg/m    Subjective:    Patient ID: Courtney Anderson, female    DOB: October 23, 1956, 61 y.o.   MRN: [id number]  HPI: Courtney Anderson is a 61 y.o. female  Chief Complaint  Patient presents with  . Annual Exam  . Rash    ON arms.   . Tendonitis    Both hands.    Patient bothered by some dry skin on both arms as been ongoing about a year no real itching otherwise doing well is slightly red.  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   Relevant past medical, surgical, family and social history reviewed and updated as indicated. Interim medical history since our last visit reviewed. Allergies and medications reviewed and updated.  Review of Systems  Constitutional: Negative.   HENT: Negative.   Eyes: Negative.   Respiratory: Negative.   Cardiovascular: Negative.   Gastrointestinal: Negative.   Endocrine: Negative.   Genitourinary: Negative.   Musculoskeletal: Negative.   Skin: Negative.   Allergic/Immunologic: Negative.   Neurological: Negative.   Hematological: Negative.   Psychiatric/Behavioral: Negative.     Per HPI unless specifically indicated above     Objective:    BP (!) 152/85   Pulse 91   Ht 5' 5.75" (1.67 m)   Wt 264 lb (119.7 kg)   SpO2 99%   BMI 42.94 kg/m   Wt Readings from Last 3 Encounters:  12/19/16 264 lb (119.7 kg)  06/02/15 260 lb 3.2 oz (118 kg)  05/31/15 261 lb (118.4 kg)    Physical Exam  Constitutional: She is oriented to person, place, and time. She appears well-developed and well-nourished.  HENT:  Head: Normocephalic and atraumatic.  Right Ear: External ear normal.  Left Ear: External  ear normal.  Nose: Nose normal.  Mouth/Throat: Oropharynx is clear and moist.  Eyes: Conjunctivae and EOM are normal. Pupils are equal, round, and reactive to light.  Neck: Normal range of motion. Neck supple. Carotid bruit is not present.  Cardiovascular: Normal rate, regular rhythm and normal heart sounds.   No murmur heard. Pulmonary/Chest: Effort normal and breath sounds normal. She exhibits no mass. Right breast exhibits no mass, no skin change and no tenderness. Left breast exhibits no mass, no skin change and no tenderness. Breasts are symmetrical.  Abdominal: Soft. Bowel sounds are normal. There is no hepatosplenomegaly.  Musculoskeletal: Normal range of motion.  Neurological: She is alert and oriented to person, place, and time.  Skin: No rash noted.  Psychiatric: She has a normal mood and affect. Her behavior is normal. Judgment and thought content normal.    Results for orders placed or performed in visit on 06/02/15  Bayer DCA Hb A1c Waived  Result Value Ref Range   Bayer DCA Hb A1c Waived 6.0 <7.0 %  Lipid Panel Piccolo, Waived  Result Value Ref Range   Cholesterol Piccolo, Waived 182 <200 mg/dL   HDL Chol Piccolo, Waived 43 (L) >59 mg/dL   Triglycerides Piccolo,Waived 93 <150 mg/dL   Chol/HDL Ratio Piccolo,Waive 4.3 mg/dL   LDL Chol Calc [employer]  120 (H) <100 mg/dL   VLDL Chol Calc Piccolo,Waive 19 <30 mg/dL  Microalbumin, Urine Waived  Result Value Ref Range   Microalb, Ur Waived 10 0 - 19 mg/L   Creatinine, Urine Waived 200 10 - 300 mg/dL   Microalb/Creat Ratio <30 <30 mg/g  Uric acid  Result Value Ref Range   Uric Acid 8.8 (H) 2.5 - 7.1 mg/dL  Comprehensive metabolic panel  Result Value Ref Range   Glucose 91 65 - 99 mg/dL   BUN 13 6 - 24 mg/dL   Creatinine, Ser 0.69 0.57 - 1.00 mg/dL   GFR calc non Af Amer 96 >59 mL/min/1.73   GFR calc Af Amer 110 >59 mL/min/1.73   BUN/Creatinine Ratio 19 9 - 23   Sodium 139 134 - 144 mmol/L   Potassium 3.7 3.5 -  5.2 mmol/L   Chloride 97 97 - 108 mmol/L   CO2 24 18 - 29 mmol/L   Calcium 10.4 (H) 8.7 - 10.2 mg/dL   Total Protein 7.9 6.0 - 8.5 g/dL   Albumin 4.2 3.5 - 5.5 g/dL   Globulin, Total 3.7 1.5 - 4.5 g/dL   Albumin/Globulin Ratio 1.1 1.1 - 2.5   Bilirubin Total 0.3 0.0 - 1.2 mg/dL   Alkaline Phosphatase 96 39 - 117 IU/L   AST 50 (H) 0 - 40 IU/L   ALT 49 (H) 0 - 32 IU/L      Assessment & Plan:   Problem List Items Addressed This Visit      Cardiovascular and Mediastinum   Hypertension   Relevant Medications   Olmesartan-Amlodipine-HCTZ (TRIBENZOR) 40-10-25 MG TABS   Other Relevant Orders   CBC with Differential/Platelet   Comprehensive metabolic panel   Urinalysis, Routine w reflex microscopic     Other   Morbid obesity (HCC)    Discuss wt loss diet exercise Pt has joined the [employer]      Hyperlipidemia   Relevant Medications   Olmesartan-Amlodipine-HCTZ (TRIBENZOR) 40-10-25 MG TABS   Other Relevant Orders   CBC with Differential/Platelet   Comprehensive metabolic panel   Lipid panel   Urinalysis, Routine w reflex microscopic    Other Visit Diagnoses    Annual physical exam    -  Primary   Relevant Orders   Hepatitis C antibody   HIV antibody   CBC with Differential/Platelet   Comprehensive metabolic panel   Lipid panel   TSH   Urinalysis, Routine w reflex microscopic   Need for hepatitis C screening test       Relevant Orders   Hepatitis C antibody   Encounter for screening for HIV       Relevant Orders   HIV antibody   Thyroid disorder screen       Relevant Orders   TSH       Follow up plan: Return in about 4 weeks (around 01/16/2017) for BMP.

## 2016-12-20 LAB — URINALYSIS, ROUTINE W REFLEX MICROSCOPIC
Bilirubin, UA: NEGATIVE
Glucose, UA: NEGATIVE
Ketones, UA: NEGATIVE
Nitrite, UA: NEGATIVE
Protein, UA: NEGATIVE
RBC, UA: NEGATIVE
Specific Gravity, UA: 1.015 (ref 1.005–1.030)
Urobilinogen, Ur: 0.2 mg/dL (ref 0.2–1.0)
pH, UA: 5.5 (ref 5.0–7.5)

## 2016-12-20 LAB — HIV ANTIBODY (ROUTINE TESTING W REFLEX): HIV Screen 4th Generation wRfx: NONREACTIVE

## 2016-12-20 LAB — CBC WITH DIFFERENTIAL/PLATELET
Basophils Absolute: 0.1 10*3/uL (ref 0.0–0.2)
Basos: 1 %
EOS (ABSOLUTE): 0.4 10*3/uL (ref 0.0–0.4)
Eos: 3 %
Hematocrit: 37.6 % (ref 34.0–46.6)
Hemoglobin: 12.1 g/dL (ref 11.1–15.9)
Immature Grans (Abs): 0 10*3/uL (ref 0.0–0.1)
Immature Granulocytes: 0 %
Lymphocytes Absolute: 4.6 10*3/uL — ABNORMAL HIGH (ref 0.7–3.1)
Lymphs: 35 %
MCH: 27.4 pg (ref 26.6–33.0)
MCHC: 32.2 g/dL (ref 31.5–35.7)
MCV: 85 fL (ref 79–97)
Monocytes Absolute: 0.9 10*3/uL (ref 0.1–0.9)
Monocytes: 7 %
Neutrophils Absolute: 7.2 10*3/uL — ABNORMAL HIGH (ref 1.4–7.0)
Neutrophils: 54 %
Platelets: 370 10*3/uL (ref 150–379)
RBC: 4.41 x10E6/uL (ref 3.77–5.28)
RDW: 13.8 % (ref 12.3–15.4)
WBC: 13.1 10*3/uL — ABNORMAL HIGH (ref 3.4–10.8)

## 2016-12-20 LAB — LIPID PANEL
Chol/HDL Ratio: 5.9 ratio units — ABNORMAL HIGH (ref 0.0–4.4)
Cholesterol, Total: 190 mg/dL (ref 100–199)
HDL: 32 mg/dL — ABNORMAL LOW (ref >39)
LDL Calculated: 136 mg/dL — ABNORMAL HIGH (ref 0–99)
Triglycerides: 108 mg/dL (ref 0–149)
VLDL Cholesterol Cal: 22 mg/dL (ref 5–40)

## 2016-12-20 LAB — COMPREHENSIVE METABOLIC PANEL
ALT: 63 IU/L — ABNORMAL HIGH (ref 0–32)
AST: 98 IU/L — ABNORMAL HIGH (ref 0–40)
Albumin/Globulin Ratio: 1.1 — ABNORMAL LOW (ref 1.2–2.2)
Albumin: 4.3 g/dL (ref 3.6–4.8)
Alkaline Phosphatase: 124 IU/L — ABNORMAL HIGH (ref 39–117)
BUN/Creatinine Ratio: 18 (ref 12–28)
BUN: 14 mg/dL (ref 8–27)
Bilirubin Total: 0.3 mg/dL (ref 0.0–1.2)
CO2: 23 mmol/L (ref 18–29)
Calcium: 11.4 mg/dL — ABNORMAL HIGH (ref 8.7–10.3)
Chloride: 94 mmol/L — ABNORMAL LOW (ref 96–106)
Creatinine, Ser: 0.79 mg/dL (ref 0.57–1.00)
GFR calc Af Amer: 93 mL/min/{1.73_m2} (ref >59)
GFR calc non Af Amer: 81 mL/min/{1.73_m2} (ref >59)
Globulin, Total: 4 g/dL (ref 1.5–4.5)
Glucose: 100 mg/dL — ABNORMAL HIGH (ref 65–99)
Potassium: 3.9 mmol/L (ref 3.5–5.2)
Sodium: 137 mmol/L (ref 134–144)
Total Protein: 8.3 g/dL (ref 6.0–8.5)

## 2016-12-20 LAB — TSH: TSH: 1.41 u[IU]/mL (ref 0.450–4.500)

## 2016-12-20 LAB — HEPATITIS C ANTIBODY: Hep C Virus Ab: <0.1 s/co ratio (ref 0.0–0.9)

## 2016-12-24 ENCOUNTER — Telehealth: Payer: Self-pay | Admitting: Family Medicine

## 2016-12-24 DIAGNOSIS — R748 Abnormal levels of other serum enzymes: Secondary | ICD-10-CM

## 2016-12-24 MED ORDER — AMOXICILLIN-POT CLAVULANATE 875-125 MG PO TABS: 1.0000 | ORAL_TABLET | Freq: Two times a day (BID) | ORAL | 0 refills | Status: DC

## 2016-12-24 NOTE — Telephone Encounter (Signed)
Phone call Discussed with patient elevated WBC patient is bothered by chronic sinus infections and a lot of nasal pressure drainage meant to bring that up at her last office visit after further discussion with patient have sloshing-type sensation in her head will treat with Augmentin.  Discuss liver enzymes elevating. Patient denies toxic agents alcohol Tylenol. Will recheck liver enzymes at next office visit next month.

## 2017-01-24 ENCOUNTER — Telehealth: Payer: Self-pay | Admitting: Unknown Physician Specialty

## 2017-01-24 NOTE — Telephone Encounter (Signed)
Routing to provider  

## 2017-01-24 NOTE — Telephone Encounter (Signed)
Patient would like to know if the generic form of tribenzor would be as good as the name brand.  [phone]  Please advise Thanks

## 2017-01-24 NOTE — Telephone Encounter (Signed)
Tribenzor 3 medications in one and may or may not be cheaper than the generic components.  She does need to be seen and we can discuss then.  I don't think there is a Estate agent.

## 2017-01-24 NOTE — Telephone Encounter (Signed)
Patient is seeing Dr. Jeananne Rama 03/04/2017. Was supposed to see Dr. Jeananne Rama 01/27/2017 but had other appointments and had to cancel.  She is stating Dr. Jeananne Rama is her PCP and doesn't know why things have gotten mixed up.   Patient was notified of information, and understood. Routing to [place] and CW

## 2017-01-27 ENCOUNTER — Ambulatory Visit: Payer: BLUE CROSS/BLUE SHIELD | Admitting: Unknown Physician Specialty

## 2017-01-27 NOTE — Telephone Encounter (Signed)
Call pt 

## 2017-03-04 ENCOUNTER — Ambulatory Visit: Payer: BLUE CROSS/BLUE SHIELD | Admitting: Family Medicine

## 2017-04-29 ENCOUNTER — Other Ambulatory Visit: Payer: Self-pay | Admitting: Family Medicine

## 2017-04-29 DIAGNOSIS — I10 Essential (primary) hypertension: Secondary | ICD-10-CM

## 2017-04-29 NOTE — Telephone Encounter (Signed)
Last OV: 12/19/16 Next OV: 04/28/17  Lab Results  Component Value Date   CHOL 190 12/19/2016   HDL 32 (L) 12/19/2016   LDLCALC 136 (H) 12/19/2016   TRIG 108 12/19/2016   CHOLHDL 5.9 (H) 12/19/2016   Lab Results  Component Value Date   CREATININE 0.79 12/19/2016   BUN 14 12/19/2016   NA 137 12/19/2016   K 3.9 12/19/2016   CL 94 (L) 12/19/2016   CO2 23 12/19/2016

## 2017-05-01 ENCOUNTER — Ambulatory Visit: Payer: BLUE CROSS/BLUE SHIELD | Admitting: Family Medicine

## 2017-05-03 ENCOUNTER — Other Ambulatory Visit: Payer: Self-pay | Admitting: Family Medicine

## 2017-05-03 DIAGNOSIS — I10 Essential (primary) hypertension: Secondary | ICD-10-CM

## 2017-06-03 ENCOUNTER — Ambulatory Visit: Payer: BLUE CROSS/BLUE SHIELD | Admitting: Family Medicine

## 2017-06-19 ENCOUNTER — Ambulatory Visit: Payer: BLUE CROSS/BLUE SHIELD | Admitting: Family Medicine

## 2017-08-25 ENCOUNTER — Ambulatory Visit: Payer: BLUE CROSS/BLUE SHIELD | Admitting: Family Medicine

## 2017-08-26 ENCOUNTER — Telehealth: Payer: Self-pay | Admitting: Unknown Physician Specialty

## 2017-08-26 NOTE — Telephone Encounter (Signed)
Created in error

## 2017-08-26 NOTE — Telephone Encounter (Signed)
Patient would like for [facility] a call.  She was upset because she said she thought her appointment was today with Dr Jeananne Rama but it was yesterday and I explained to her that the appointment schedule showed Monday 22 @ 10:45.    Thanks

## 2017-08-26 NOTE — Telephone Encounter (Signed)
Attempted to reach patient at her request. Line continuously rang, no answer, no vm.

## 2017-08-26 NOTE — Telephone Encounter (Signed)
Copied from [place] [id number]. Topic: Quick Communication - See Telephone Encounter >> Aug 26, 2017 11:13 AM Ether Griffins B wrote: CRM for notification. See Telephone encounter for:  08/26/17. Pt has called again stating she wrote down her appt was for today not yesterday. Also pt is wanting a sample on her medicine the doc has prescribed bc her copay is different every month and she hasnt taken her meds in 5 days due to not being able to afford it. She states the number on file isnt correct I have updated that information in her demographics.

## 2017-08-26 NOTE — Telephone Encounter (Signed)
Spoke with patient, advised that we do not carry HTN samples. Pt stated she would call the company that makes it, and we could discuss it on Friday when she come in to see Dr. Jeananne Rama.

## 2017-08-29 ENCOUNTER — Ambulatory Visit (INDEPENDENT_AMBULATORY_CARE_PROVIDER_SITE_OTHER): Payer: BLUE CROSS/BLUE SHIELD | Admitting: Family Medicine

## 2017-08-29 ENCOUNTER — Encounter: Payer: Self-pay | Admitting: Family Medicine

## 2017-08-29 VITALS — BP 147/87 | HR 87 | Wt 211.0 lb

## 2017-08-29 DIAGNOSIS — I10 Essential (primary) hypertension: Secondary | ICD-10-CM

## 2017-08-29 DIAGNOSIS — R740 Nonspecific elevation of levels of transaminase and lactic acid dehydrogenase [LDH]: Secondary | ICD-10-CM | POA: Diagnosis not present

## 2017-08-29 DIAGNOSIS — Z23 Encounter for immunization: Secondary | ICD-10-CM

## 2017-08-29 DIAGNOSIS — R7401 Elevation of levels of liver transaminase levels: Secondary | ICD-10-CM

## 2017-08-29 DIAGNOSIS — R000 Tachycardia, unspecified: Secondary | ICD-10-CM

## 2017-08-29 MED ORDER — METOPROLOL SUCCINATE ER 25 MG PO TB24
25.0000 mg | ORAL_TABLET | Freq: Every day | ORAL | 3 refills | Status: DC
Start: 2017-08-29 — End: 2018-05-13

## 2017-08-29 MED ORDER — OLMESARTAN-AMLODIPINE-HCTZ 40-10-25 MG PO TABS: 1.0000 | ORAL_TABLET | Freq: Every day | ORAL | 6 refills | Status: DC

## 2017-08-29 MED ORDER — MELOXICAM 15 MG PO TABS: 15.0000 mg | ORAL_TABLET | Freq: Every day | ORAL | 6 refills | Status: DC

## 2017-08-29 NOTE — Assessment & Plan Note (Signed)
Reviewed EKG which is normal sinus rhythm in spite of computer says atrial flutter. EKG otherwise normal.

## 2017-08-29 NOTE — Assessment & Plan Note (Signed)
Will check blood work.

## 2017-08-29 NOTE — Assessment & Plan Note (Signed)
Discuss elevated blood pressure in spite of taking medications faithfully. Discussed tachycardia. Patient under a great deal of stress which should be abating later this winter. In the meantime we will start metoprolol 25 mg and recheck blood pressure and palpitations in a month or so.

## 2017-08-29 NOTE — Progress Notes (Signed)
BP (!) 147/87   Pulse 87   Wt 211 lb (95.7 kg)   SpO2 98%   BMI 34.32 kg/m    Subjective:    Patient ID: Courtney Anderson, female    DOB: November 17, 1955, 61 y.o.   MRN: [id number]  HPI: Courtney Anderson is a 61 y.o. female  Chief Complaint  Patient presents with  . Follow-up  Patient follow-up taking blood pressure medicine without problems no palpitations or issues has lost 49 pounds since last visit. Patient's been very active with weight loss and exercise and is feeling much better. 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 Interim medical history since our last visit reviewed. Allergies and medications reviewed and updated.  Review of Systems  Constitutional: Negative.   Respiratory: Negative.   Cardiovascular: Negative.     Per HPI unless specifically indicated above     Objective:    BP (!) 147/87   Pulse 87   Wt 211 lb (95.7 kg)   SpO2 98%   BMI 34.32 kg/m   Wt Readings from Last 3 Encounters:  08/29/17 211 lb (95.7 kg)  12/19/16 264 lb (119.7 kg)  06/02/15 260 lb 3.2 oz (118 kg)    Physical Exam  Constitutional: She is oriented to person, place, and time. She appears well-developed and well-nourished.  HENT:  Head: Normocephalic and atraumatic.  Eyes: Conjunctivae and EOM are normal.  Neck: Normal range of motion.  Cardiovascular: Normal rate, regular rhythm and normal heart sounds.   Pulmonary/Chest: Effort normal and breath sounds normal.  Musculoskeletal: Normal range of motion.  Neurological: She is alert and oriented to person, place, and time.  Skin: No erythema.  Psychiatric: She has a normal mood and affect. Her behavior is normal. Judgment and thought content  normal.    Results for orders placed or performed in visit on 12/19/16  Hepatitis C antibody  Result Value Ref Range   Hep C Virus Ab <0.1 0.0 - 0.9 s/co ratio  HIV antibody  Result Value Ref Range   HIV Screen 4th Generation wRfx Non Reactive Non Reactive  CBC with Differential/Platelet  Result Value Ref Range   WBC 13.1 (H) 3.4 - 10.8 x10E3/uL   RBC 4.41 3.77 - 5.28 x10E6/uL   Hemoglobin 12.1 11.1 - 15.9 g/dL   Hematocrit 37.6 34.0 - 46.6 %   MCV 85 79 - 97 fL   MCH 27.4 26.6 - 33.0 pg   MCHC 32.2 31.5 - 35.7 g/dL   RDW 13.8 12.3 - 15.4 %   Platelets 370 150 - 379 x10E3/uL   Neutrophils 54 Not Estab. %   Lymphs 35 Not Estab. %   Monocytes 7 Not Estab. %   Eos 3 Not Estab. %   Basos 1 Not Estab. %   Neutrophils Absolute 7.2 (H) 1.4 - 7.0 x10E3/uL   Lymphocytes Absolute 4.6 (H) 0.7 - 3.1 x10E3/uL   Monocytes Absolute 0.9 0.1 - 0.9 x10E3/uL   EOS (ABSOLUTE) 0.4 0.0 - 0.4 x10E3/uL   Basophils Absolute 0.1 0.0 - 0.2 x10E3/uL   Immature Granulocytes 0 Not Estab. %   Immature Grans (Abs) 0.0 0.0 - 0.1 x10E3/uL  Comprehensive metabolic panel  Result Value Ref Range  Glucose 100 (H) 65 - 99 mg/dL   BUN 14 8 - 27 mg/dL   Creatinine, Ser 0.79 0.57 - 1.00 mg/dL   GFR calc non Af Amer 81 >59 mL/min/1.73   GFR calc Af Amer 93 >59 mL/min/1.73   BUN/Creatinine Ratio 18 12 - 28   Sodium 137 134 - 144 mmol/L   Potassium 3.9 3.5 - 5.2 mmol/L   Chloride 94 (L) 96 - 106 mmol/L   CO2 23 18 - 29 mmol/L   Calcium 11.4 (H) 8.7 - 10.3 mg/dL   Total Protein 8.3 6.0 - 8.5 g/dL   Albumin 4.3 3.6 - 4.8 g/dL   Globulin, Total 4.0 1.5 - 4.5 g/dL   Albumin/Globulin Ratio 1.1 (L) 1.2 - 2.2   Bilirubin Total 0.3 0.0 - 1.2 mg/dL   Alkaline Phosphatase 124 (H) 39 - 117 IU/L   AST 98 (H) 0 - 40 IU/L   ALT 63 (H) 0 - 32 IU/L  Lipid panel  Result Value Ref Range   Cholesterol, Total 190 100 - 199 mg/dL   Triglycerides 108 0 - 149 mg/dL   HDL 32 (L) >39 mg/dL   VLDL Cholesterol Cal 22 5 - 40  mg/dL   LDL Calculated 136 (H) 0 - 99 mg/dL   Chol/HDL Ratio 5.9 (H) 0.0 - 4.4 ratio units  TSH  Result Value Ref Range   TSH 1.410 0.450 - 4.500 uIU/mL  Urinalysis, Routine w reflex microscopic  Result Value Ref Range   Specific Gravity, UA 1.015 1.005 - 1.030   pH, UA 5.5 5.0 - 7.5   Color, UA Yellow Yellow   Appearance Ur Clear Clear   Leukocytes, UA Trace (A) Negative   Protein, UA Negative Negative/Trace   Glucose, UA Negative Negative   Ketones, UA Negative Negative   RBC, UA Negative Negative   Bilirubin, UA Negative Negative   Urobilinogen, Ur 0.2 0.2 - 1.0 mg/dL   Nitrite, UA Negative Negative      Assessment & Plan:   Problem List Items Addressed This Visit    None    Visit Diagnoses    Needs flu shot    -  Primary   Relevant Orders   Flu Vaccine QUAD 36+ mos IM       Follow up plan: No Follow-up on file.

## 2017-08-30 LAB — CBC WITH DIFFERENTIAL/PLATELET
Basophils Absolute: 0 10*3/uL (ref 0.0–0.2)
Basos: 0 %
EOS (ABSOLUTE): 0.2 10*3/uL (ref 0.0–0.4)
Eos: 3 %
Hematocrit: 37.6 % (ref 34.0–46.6)
Hemoglobin: 11.8 g/dL (ref 11.1–15.9)
Immature Grans (Abs): 0 10*3/uL (ref 0.0–0.1)
Immature Granulocytes: 0 %
Lymphocytes Absolute: 2.3 10*3/uL (ref 0.7–3.1)
Lymphs: 33 %
MCH: 24.9 pg — ABNORMAL LOW (ref 26.6–33.0)
MCHC: 31.4 g/dL — ABNORMAL LOW (ref 31.5–35.7)
MCV: 79 fL (ref 79–97)
Monocytes Absolute: 0.7 10*3/uL (ref 0.1–0.9)
Monocytes: 10 %
Neutrophils Absolute: 3.9 10*3/uL (ref 1.4–7.0)
Neutrophils: 54 %
Platelets: 241 10*3/uL (ref 150–379)
RBC: 4.74 x10E6/uL (ref 3.77–5.28)
RDW: 13.9 % (ref 12.3–15.4)
WBC: 7.1 10*3/uL (ref 3.4–10.8)

## 2017-08-30 LAB — COMPREHENSIVE METABOLIC PANEL
ALT: 29 IU/L (ref 0–32)
AST: 27 IU/L (ref 0–40)
Albumin/Globulin Ratio: 1.3 (ref 1.2–2.2)
Albumin: 3.9 g/dL (ref 3.6–4.8)
Alkaline Phosphatase: 94 IU/L (ref 39–117)
BUN/Creatinine Ratio: 31 — ABNORMAL HIGH (ref 12–28)
BUN: 24 mg/dL (ref 8–27)
Bilirubin Total: 0.3 mg/dL (ref 0.0–1.2)
CO2: 27 mmol/L (ref 20–29)
Calcium: 12.5 mg/dL — ABNORMAL HIGH (ref 8.7–10.3)
Chloride: 102 mmol/L (ref 96–106)
Creatinine, Ser: 0.78 mg/dL (ref 0.57–1.00)
GFR calc Af Amer: 94 mL/min/{1.73_m2} (ref >59)
GFR calc non Af Amer: 82 mL/min/{1.73_m2} (ref >59)
Globulin, Total: 3.1 g/dL (ref 1.5–4.5)
Glucose: 100 mg/dL — ABNORMAL HIGH (ref 65–99)
Potassium: 4.1 mmol/L (ref 3.5–5.2)
Sodium: 142 mmol/L (ref 134–144)
Total Protein: 7 g/dL (ref 6.0–8.5)

## 2017-09-01 ENCOUNTER — Encounter: Payer: Self-pay | Admitting: Family Medicine

## 2017-09-01 ENCOUNTER — Telehealth: Payer: Self-pay | Admitting: Family Medicine

## 2017-09-01 NOTE — Telephone Encounter (Signed)
Phone call Discussed with patient elevated calcium will check intact PTH and reassess.

## 2017-09-01 NOTE — Telephone Encounter (Signed)
-----   Message from Georgina Peer, [place] sent at 09/01/2017  4:48 PM EDT ----- Phone call.

## 2017-09-02 ENCOUNTER — Telehealth: Payer: Self-pay | Admitting: Family Medicine

## 2017-09-02 NOTE — Telephone Encounter (Signed)
Copied from [place] #[id number]. Topic: Inquiry >> Sep 02, 2017  3:41 PM Malena Catholic I, [place] wrote: Reason for CRM: pt return a phone call

## 2017-09-03 NOTE — Telephone Encounter (Signed)
Patient is calling back to check on her lab report. She states she has been waiting 2 days for the results and would really like a call back soon.

## 2017-09-04 ENCOUNTER — Telehealth: Payer: Self-pay | Admitting: Family Medicine

## 2017-09-04 NOTE — Telephone Encounter (Signed)
Attempted to reach patient x 2 yesterday. Provider wishes to speak to patient, therefor must call after provider sees the days patients. Will attempt again today around lunch. Please refer to result note for further updates.

## 2017-09-04 NOTE — Telephone Encounter (Signed)
Phone call Discussed with patient needs to come back for intact PTH testing. This is because special draw is needed. Patient will comply.

## 2017-09-23 ENCOUNTER — Telehealth: Payer: Self-pay | Admitting: Family Medicine

## 2017-09-23 MED ORDER — AMOXICILLIN 875 MG PO TABS: 875.0000 mg | ORAL_TABLET | Freq: Two times a day (BID) | ORAL | 0 refills | Status: DC

## 2017-09-23 NOTE — Telephone Encounter (Signed)
Spoke with patient. She has been feeling under the weather x 2-3 days. She has complaints of headache, watering eyes, drainage that cause uncontrollable cough. Worse at night. Pt has been taking OTC allergy medication w/o relief. Would like to know if something can be called in. Please advise.

## 2017-09-23 NOTE — Telephone Encounter (Signed)
[facility]

## 2017-09-23 NOTE — Telephone Encounter (Signed)
Message relayed to patient. Verbalized understanding and denied questions.   

## 2017-09-23 NOTE — Telephone Encounter (Signed)
Copied from [place] [id number]. Topic: Inquiry >> Sep 23, 2017  8:53 AM Cecelia Byars, NT wrote: Reason for CRM: Patient would like a return call back from Dr, Durenda Age  nurse  today

## 2017-09-24 LAB — CALCIUM: Calcium: 12.5 mg/dL — ABNORMAL HIGH (ref 8.7–10.3)

## 2017-09-24 LAB — SPECIMEN STATUS REPORT

## 2017-09-30 ENCOUNTER — Telehealth: Payer: Self-pay | Admitting: Family Medicine

## 2017-09-30 NOTE — Telephone Encounter (Signed)
Phone call Discussed with patient calcium remains high we will check parathyroid hormone

## 2017-10-01 ENCOUNTER — Other Ambulatory Visit: Payer: BLUE CROSS/BLUE SHIELD

## 2017-10-01 DIAGNOSIS — R748 Abnormal levels of other serum enzymes: Secondary | ICD-10-CM

## 2017-10-02 LAB — AST: AST: 32 IU/L (ref 0–40)

## 2017-10-02 LAB — ALT: ALT: 22 IU/L (ref 0–32)

## 2017-10-02 LAB — PTH, INTACT AND CALCIUM
Calcium: 12.4 mg/dL — ABNORMAL HIGH (ref 8.7–10.3)
PTH: 32 pg/mL (ref 15–65)

## 2017-10-07 ENCOUNTER — Telehealth: Payer: Self-pay | Admitting: Family Medicine

## 2017-10-07 NOTE — Telephone Encounter (Signed)
Phone call Discussed with patient elevated calcium normal PTH. Will refer to endocrinology to further evaluate.

## 2017-10-13 ENCOUNTER — Ambulatory Visit: Payer: BLUE CROSS/BLUE SHIELD | Admitting: Family Medicine

## 2017-10-20 ENCOUNTER — Ambulatory Visit
Admission: RE | Admit: 2017-10-20 | Discharge: 2017-10-20 | Disposition: A | Discharge status: Home / Self Care | Payer: Disability Insurance | Source: Ambulatory Visit | Attending: Obstetrics and Gynecology | Admitting: Obstetrics and Gynecology

## 2017-10-20 ENCOUNTER — Other Ambulatory Visit: Payer: Self-pay | Admitting: Obstetrics and Gynecology

## 2017-10-20 ENCOUNTER — Ambulatory Visit: Payer: Self-pay | Admitting: Family Medicine

## 2017-10-20 DIAGNOSIS — I10 Essential (primary) hypertension: Secondary | ICD-10-CM | POA: Insufficient documentation

## 2017-10-20 DIAGNOSIS — M159 Polyosteoarthritis, unspecified: Secondary | ICD-10-CM | POA: Diagnosis not present

## 2017-10-20 DIAGNOSIS — R42 Dizziness and giddiness: Secondary | ICD-10-CM | POA: Insufficient documentation

## 2017-10-20 DIAGNOSIS — M1990 Unspecified osteoarthritis, unspecified site: Secondary | ICD-10-CM

## 2018-03-03 ENCOUNTER — Telehealth: Payer: Self-pay | Admitting: Family Medicine

## 2018-03-03 DIAGNOSIS — I10 Essential (primary) hypertension: Secondary | ICD-10-CM

## 2018-03-03 MED ORDER — OLMESARTAN-AMLODIPINE-HCTZ 40-10-25 MG PO TABS: 1.0000 | ORAL_TABLET | Freq: Every day | ORAL | 2 refills | Status: DC

## 2018-03-03 NOTE — Telephone Encounter (Signed)
Copied from [place] [id number]. Topic: Quick Communication - Rx Refill/Question >> Mar 03, 2018  9:03 AM Boyd Kerbs wrote: Medication:   Olmesartan-Amlodipine-HCTZ 40-10-25 MG TABS  Appt. 6/6  Has the patient contacted their pharmacy? No. (Agent: If no, request that the patient contact the pharmacy for the refill.) Preferred Pharmacy (with phone number or street name):   [id number] [place] [address] [place] [address] Phone: [phone] Fax: [phone]   Agent: Please be advised that RX refills may take up to 3 business days. We ask that you follow-up with your pharmacy.

## 2018-03-05 ENCOUNTER — Ambulatory Visit: Payer: BLUE CROSS/BLUE SHIELD | Admitting: Family Medicine

## 2018-03-05 NOTE — Telephone Encounter (Signed)
Pt states she she does not have insurance at the time and her Olmesartan-amLODIPine-HCTZ 40-10-25 MG TABS is costing her $200.00. She would like to know if another medication could be called in.   Please f/u with pt.

## 2018-03-05 NOTE — Telephone Encounter (Signed)
Called and asked patient to call pharmacy to find out cost of medications.

## 2018-04-09 ENCOUNTER — Ambulatory Visit: Payer: Self-pay | Admitting: Family Medicine

## 2018-05-13 ENCOUNTER — Ambulatory Visit (INDEPENDENT_AMBULATORY_CARE_PROVIDER_SITE_OTHER): Payer: BLUE CROSS/BLUE SHIELD | Admitting: Family Medicine

## 2018-05-13 ENCOUNTER — Encounter: Payer: Self-pay | Admitting: Family Medicine

## 2018-05-13 VITALS — BP 140/75 | HR 99 | Wt 192.0 lb

## 2018-05-13 DIAGNOSIS — R748 Abnormal levels of other serum enzymes: Secondary | ICD-10-CM

## 2018-05-13 DIAGNOSIS — E785 Hyperlipidemia, unspecified: Secondary | ICD-10-CM | POA: Diagnosis not present

## 2018-05-13 DIAGNOSIS — Z1329 Encounter for screening for other suspected endocrine disorder: Secondary | ICD-10-CM | POA: Diagnosis not present

## 2018-05-13 DIAGNOSIS — I10 Essential (primary) hypertension: Secondary | ICD-10-CM

## 2018-05-13 DIAGNOSIS — Z0001 Encounter for general adult medical examination with abnormal findings: Secondary | ICD-10-CM

## 2018-05-13 DIAGNOSIS — R000 Tachycardia, unspecified: Secondary | ICD-10-CM | POA: Diagnosis not present

## 2018-05-13 LAB — MICROSCOPIC EXAMINATION

## 2018-05-13 LAB — URINALYSIS, ROUTINE W REFLEX MICROSCOPIC
Bilirubin, UA: NEGATIVE
Glucose, UA: NEGATIVE
Ketones, UA: NEGATIVE
Nitrite, UA: NEGATIVE
Protein, UA: NEGATIVE
RBC, UA: NEGATIVE
Specific Gravity, UA: 1.02 (ref 1.005–1.030)
Urobilinogen, Ur: 0.2 mg/dL (ref 0.2–1.0)
pH, UA: 5 (ref 5.0–7.5)

## 2018-05-13 MED ORDER — METOPROLOL SUCCINATE ER 25 MG PO TB24: 25.0000 mg | ORAL_TABLET | Freq: Every day | ORAL | 12 refills | Status: DC

## 2018-05-13 MED ORDER — OLMESARTAN-AMLODIPINE-HCTZ 40-10-25 MG PO TABS: 1.0000 | ORAL_TABLET | Freq: Every day | ORAL | 12 refills | Status: DC

## 2018-05-13 NOTE — Progress Notes (Signed)
BP 140/75   Pulse 99   Wt 192 lb (87.1 kg)   SpO2 98%   BMI 31.23 kg/m    Subjective:    Patient ID: Courtney Anderson, female    DOB: 1956-02-13, 62 y.o.   MRN: [id number]  HPI: Courtney Anderson is a 62 y.o. female  Chief Complaint  Patient presents with  . Annual Exam  Patient with long-term diffuse arthritis legs hands shoulders this been ongoing for several years and is been to an arthritis doctor with no real relief has taken meloxicam with no real relief. Tried high-dose Tylenol and is too strong has not tried lower dose Tylenol. Aleve will help sometimes. Blood pressure doing well with medications no complaints. Patient also will occasionally see a little bit of blood in her stool has not finished the Cologuard test we will check to see if that still active and complete the Cologuard test.  Relevant past medical, surgical, family and social history reviewed and updated as indicated. Interim medical history since our last visit reviewed. Allergies and medications reviewed and updated.  Review of Systems  Constitutional: Negative.   HENT: Negative.   Eyes: Negative.   Respiratory: Negative.   Cardiovascular: Negative.   Gastrointestinal: Negative.   Endocrine: Negative.   Genitourinary: Negative.   Musculoskeletal: Negative.   Skin: Negative.   Allergic/Immunologic: Negative.   Neurological: Negative.   Hematological: Negative.   Psychiatric/Behavioral: Negative.     Per HPI unless specifically indicated above     Objective:    BP 140/75   Pulse 99   Wt 192 lb (87.1 kg)   SpO2 98%   BMI 31.23 kg/m   Wt Readings from Last 3 Encounters:  05/13/18 192 lb (87.1 kg)  08/29/17 211 lb (95.7 kg)  12/19/16 264 lb (119.7 kg)    Physical Exam  Constitutional: She is oriented to person, place, and time. She appears well-developed and well-nourished.  HENT:  Head: Normocephalic and atraumatic.  Right Ear: External ear normal.  Left Ear: External ear normal.    Nose: Nose normal.  Mouth/Throat: Oropharynx is clear and moist.  Eyes: Pupils are equal, round, and reactive to light. Conjunctivae and EOM are normal.  Neck: Normal range of motion. Neck supple. Carotid bruit is not present.  Cardiovascular: Normal rate, regular rhythm and normal heart sounds.  No murmur heard. Pulmonary/Chest: Effort normal and breath sounds normal. She exhibits no mass. Right breast exhibits no mass, no skin change and no tenderness. Left breast exhibits no mass, no skin change and no tenderness. Breasts are symmetrical.  Abdominal: Soft. Bowel sounds are normal. There is no hepatosplenomegaly.  Musculoskeletal: Normal range of motion.  Neurological: She is alert and oriented to person, place, and time.  Skin: No rash noted.  Psychiatric: She has a normal mood and affect. Her behavior is normal. Judgment and thought content normal.    Results for orders placed or performed in visit on 10/01/17  PTH, Intact and Calcium  Result Value Ref Range   Calcium 12.4 (H) 8.7 - 10.3 mg/dL   PTH 32 15 - 65 pg/mL   PTH Interp Comment   AST  Result Value Ref Range   AST 32 0 - 40 IU/L  ALT  Result Value Ref Range   ALT 22 0 - 32 IU/L      Assessment & Plan:   Problem List Items Addressed This Visit      Cardiovascular and Mediastinum   Hypertension - Primary  Relevant Medications   metoprolol succinate (TOPROL-XL) 25 MG 24 hr tablet   Olmesartan-amLODIPine-HCTZ 40-10-25 MG TABS   Other Relevant Orders   CBC with Differential/Platelet   Comprehensive metabolic panel   Lipid panel   Urinalysis, Routine w reflex microscopic     Other   Hyperlipidemia   Relevant Medications   metoprolol succinate (TOPROL-XL) 25 MG 24 hr tablet   Olmesartan-amLODIPine-HCTZ 40-10-25 MG TABS   Other Relevant Orders   CBC with Differential/Platelet   Comprehensive metabolic panel   Lipid panel   Urinalysis, Routine w reflex microscopic   Tachycardia   Relevant Medications    metoprolol succinate (TOPROL-XL) 25 MG 24 hr tablet    Other Visit Diagnoses    Hypercalcemia       Relevant Orders   Comprehensive metabolic panel   Urinalysis, Routine w reflex microscopic   Elevated liver enzymes       Relevant Orders   Comprehensive metabolic panel   Urinalysis, Routine w reflex microscopic   Thyroid disorder screen       Relevant Orders   TSH       Follow up plan: Return in about 6 months (around 11/13/2018) for BMP.

## 2018-05-14 ENCOUNTER — Telehealth: Payer: Self-pay | Admitting: Family Medicine

## 2018-05-14 DIAGNOSIS — R7989 Other specified abnormal findings of blood chemistry: Secondary | ICD-10-CM

## 2018-05-14 LAB — COMPREHENSIVE METABOLIC PANEL
ALT: 22 IU/L (ref 0–32)
AST: 30 IU/L (ref 0–40)
Albumin/Globulin Ratio: 1.2 (ref 1.2–2.2)
Albumin: 4.1 g/dL (ref 3.6–4.8)
Alkaline Phosphatase: 126 IU/L — ABNORMAL HIGH (ref 39–117)
BUN/Creatinine Ratio: 22 (ref 12–28)
BUN: 17 mg/dL (ref 8–27)
Bilirubin Total: 0.7 mg/dL (ref 0.0–1.2)
CO2: 25 mmol/L (ref 20–29)
Calcium: 12.2 mg/dL — ABNORMAL HIGH (ref 8.7–10.3)
Chloride: 99 mmol/L (ref 96–106)
Creatinine, Ser: 0.76 mg/dL (ref 0.57–1.00)
GFR calc Af Amer: 97 mL/min/{1.73_m2} (ref >59)
GFR calc non Af Amer: 84 mL/min/{1.73_m2} (ref >59)
Globulin, Total: 3.5 g/dL (ref 1.5–4.5)
Glucose: 94 mg/dL (ref 65–99)
Potassium: 3.9 mmol/L (ref 3.5–5.2)
Sodium: 139 mmol/L (ref 134–144)
Total Protein: 7.6 g/dL (ref 6.0–8.5)

## 2018-05-14 LAB — LIPID PANEL
Chol/HDL Ratio: 3.9 ratio (ref 0.0–4.4)
Cholesterol, Total: 130 mg/dL (ref 100–199)
HDL: 33 mg/dL — ABNORMAL LOW (ref >39)
LDL Calculated: 78 mg/dL (ref 0–99)
Triglycerides: 96 mg/dL (ref 0–149)
VLDL Cholesterol Cal: 19 mg/dL (ref 5–40)

## 2018-05-14 LAB — CBC WITH DIFFERENTIAL/PLATELET
Basophils Absolute: 0 10*3/uL (ref 0.0–0.2)
Basos: 0 %
EOS (ABSOLUTE): 0.2 10*3/uL (ref 0.0–0.4)
Eos: 3 %
Hematocrit: 37.7 % (ref 34.0–46.6)
Hemoglobin: 12.4 g/dL (ref 11.1–15.9)
Immature Grans (Abs): 0 10*3/uL (ref 0.0–0.1)
Immature Granulocytes: 0 %
Lymphocytes Absolute: 2.4 10*3/uL (ref 0.7–3.1)
Lymphs: 37 %
MCH: 24 pg — ABNORMAL LOW (ref 26.6–33.0)
MCHC: 32.9 g/dL (ref 31.5–35.7)
MCV: 73 fL — ABNORMAL LOW (ref 79–97)
Monocytes Absolute: 0.6 10*3/uL (ref 0.1–0.9)
Monocytes: 9 %
Neutrophils Absolute: 3.2 10*3/uL (ref 1.4–7.0)
Neutrophils: 51 %
Platelets: 219 10*3/uL (ref 150–450)
RBC: 5.17 x10E6/uL (ref 3.77–5.28)
RDW: 13.3 % (ref 12.3–15.4)
WBC: 6.4 10*3/uL (ref 3.4–10.8)

## 2018-05-14 LAB — TSH: TSH: <0.006 u[IU]/mL — ABNORMAL LOW (ref 0.450–4.500)

## 2018-05-14 NOTE — Telephone Encounter (Signed)
Copied from [place] [id number]. Topic: Quick Communication - See Telephone Encounter >> May 14, 2018  4:42 PM Bea Graff, NT wrote: CRM for notification. See Telephone encounter for: 05/14/18. Pt calling back to get lab results.

## 2018-05-18 NOTE — Telephone Encounter (Signed)
Call pt 

## 2018-05-18 NOTE — Telephone Encounter (Signed)
Phone call Discussed with patient elevated calcium and suppressed TSH will check calcium PTH and TSH again couple of weeks.

## 2018-05-18 NOTE — Telephone Encounter (Signed)
-----   Message from Courtney Anderson, [place] sent at 05/18/2018  5:03 PM EDT ----- Patient was transferred to provider for telephone conversation.

## 2018-05-27 ENCOUNTER — Other Ambulatory Visit: Payer: BLUE CROSS/BLUE SHIELD

## 2018-05-27 DIAGNOSIS — R7989 Other specified abnormal findings of blood chemistry: Secondary | ICD-10-CM

## 2018-05-28 ENCOUNTER — Other Ambulatory Visit: Payer: Self-pay | Admitting: Family Medicine

## 2018-05-28 ENCOUNTER — Telehealth: Payer: Self-pay | Admitting: Family Medicine

## 2018-05-28 DIAGNOSIS — R000 Tachycardia, unspecified: Secondary | ICD-10-CM

## 2018-05-28 DIAGNOSIS — E0590 Thyrotoxicosis, unspecified without thyrotoxic crisis or storm: Secondary | ICD-10-CM

## 2018-05-28 DIAGNOSIS — I10 Essential (primary) hypertension: Secondary | ICD-10-CM

## 2018-05-28 LAB — PTH, INTACT AND CALCIUM
Calcium: 11.3 mg/dL — ABNORMAL HIGH (ref 8.7–10.3)
PTH: 45 pg/mL (ref 15–65)

## 2018-05-28 LAB — TSH: TSH: <0.006 u[IU]/mL — ABNORMAL LOW (ref 0.450–4.500)

## 2018-05-28 MED ORDER — METOPROLOL SUCCINATE ER 50 MG PO TB24: 50.0000 mg | ORAL_TABLET | Freq: Every day | ORAL | 6 refills | Status: DC

## 2018-05-28 NOTE — Telephone Encounter (Signed)
-----   Message from Amada Kingfisher, [place] sent at 05/28/2018 12:03 PM EDT ----- Patient was transferred to provider for telephone conversation.

## 2018-05-28 NOTE — Telephone Encounter (Signed)
Phone call Discussed with patient suppressed TSH elevated calcium will refer to endocrinology to further evaluate because of elevated pulse on last visit and depressed TSH will call in metoprolol for patient.

## 2018-06-01 ENCOUNTER — Telehealth: Payer: Self-pay | Admitting: Family Medicine

## 2018-06-01 NOTE — Telephone Encounter (Signed)
Copied from [place] [id number]. Topic: Quick Communication - See Telephone Encounter >> Jun 01, 2018  9:50 AM Burchel, Abbi R wrote: CRM for notification. See Telephone encounter for: 06/01/18.  Pt has some questions about side effects for: metoprolol succinate (TOPROL-XL) 50 MG 24 hr tablet prescribed by Dr Jeananne Rama. Pt states she is itching on her legs. She also states she thinks BP meds are too strong and would like a call back from Dr Rance Muir nurse to discuss.    Pt: [phone]

## 2018-06-02 NOTE — Telephone Encounter (Signed)
Called to speak with patient. She stated she wanted to know why Dr. Jeananne Rama had added the extra BP medication as her BP runs good at home, but slightly elevated here. Pt stated Dr. Jeananne Rama knows she has white coat. Pt stated she does not know what her BP is running currently. Pt wants to wait until Dr. Jeananne Rama returns to speak to him.

## 2018-06-02 NOTE — Telephone Encounter (Signed)
Please let her know that Dr. Jeananne Rama is out of the office. Itching in the legs is not usually a side effect of metoprolol. What is her BP running?

## 2018-06-02 NOTE — Telephone Encounter (Signed)
Will leave for Dr. Rance Muir return

## 2018-06-08 NOTE — Telephone Encounter (Signed)
Call pt 

## 2018-06-09 NOTE — Telephone Encounter (Signed)
Patient was transferred to provider for telephone conversation.   

## 2018-06-09 NOTE — Telephone Encounter (Signed)
Phone call Discussed with patient metoprolol increased dose is not for blood pressure but for cardiac protection from hyperthyroid until she gets in to see her thyroid doctor for a suppressed TSH.

## 2018-06-22 ENCOUNTER — Telehealth: Payer: Self-pay | Admitting: Family Medicine

## 2018-06-22 NOTE — Telephone Encounter (Signed)
Copied from [place] [id number]. Topic: Referral - Request >> Jun 22, 2018  8:46 AM Mylinda Latina, NT wrote: Reason for CRM: patient called and states she was suppose to see a Endocrinologist at [facility]. She has an appt in OCT. She states that is too far way. Can Dr. Jeananne Rama refer her to another provider. She does not want to wait that long   CB# [phone] >> Jun 22, 2018  9:36 AM Amada Kingfisher, CMA wrote: Spoke with patient. She would like referral sent to [facility] at [facility] to see if they can get her in sooner.    Can you please have Dr. Jeananne Rama put in a new referral for this location please. [facility].

## 2018-06-23 NOTE — Telephone Encounter (Signed)
ok 

## 2018-08-20 DIAGNOSIS — E041 Nontoxic single thyroid nodule: Secondary | ICD-10-CM | POA: Insufficient documentation

## 2018-08-20 DIAGNOSIS — Z8639 Personal history of other endocrine, nutritional and metabolic disease: Secondary | ICD-10-CM | POA: Insufficient documentation

## 2018-08-20 DIAGNOSIS — E042 Nontoxic multinodular goiter: Secondary | ICD-10-CM | POA: Insufficient documentation

## 2018-08-20 DIAGNOSIS — E0590 Thyrotoxicosis, unspecified without thyrotoxic crisis or storm: Secondary | ICD-10-CM | POA: Insufficient documentation

## 2018-08-31 LAB — COLOGUARD

## 2018-09-21 ENCOUNTER — Telehealth: Payer: Self-pay

## 2018-09-21 NOTE — Telephone Encounter (Signed)
Call pt 

## 2018-09-21 NOTE — Telephone Encounter (Signed)
Copied from [place] [phone]. Topic: General - Other >> Sep 21, 2018 10:35 AM Carolyn Stare wrote:  Pt said she had a thyroid test done and has not heard anything and id asking if Dr Jeananne Rama has received the results >> Sep 21, 2018 11:43 AM Shaune Pollack wrote: SEE MESSAGE. PLEASE ADVISE. THANKS

## 2018-09-21 NOTE — Telephone Encounter (Signed)
Last TSH lab was 05/27/2018. Routing to provider to advise.

## 2018-09-22 ENCOUNTER — Other Ambulatory Visit: Payer: Self-pay | Admitting: "Endocrinology

## 2018-09-22 ENCOUNTER — Other Ambulatory Visit: Payer: Self-pay | Admitting: Family Medicine

## 2018-09-22 DIAGNOSIS — E041 Nontoxic single thyroid nodule: Secondary | ICD-10-CM

## 2018-09-22 DIAGNOSIS — E0590 Thyrotoxicosis, unspecified without thyrotoxic crisis or storm: Secondary | ICD-10-CM

## 2018-09-22 NOTE — Telephone Encounter (Signed)
Phone call

## 2018-09-23 NOTE — Telephone Encounter (Signed)
Does this patient know about her appointments and radiology on the 24th and has she heard about any appointments for endocrinology?

## 2018-09-23 NOTE — Telephone Encounter (Signed)
Patient is aware of upcoming appointments. Endocrinology has not called her yet. Per referral it appears it was sent to them yesterday and they would review notes then call to schedule.

## 2018-09-28 ENCOUNTER — Encounter
Admission: RE | Admit: 2018-09-28 | Discharge: 2018-09-28 | Disposition: A | Discharge status: Home / Self Care | Payer: BLUE CROSS/BLUE SHIELD | Source: Ambulatory Visit | Attending: "Endocrinology | Admitting: "Endocrinology

## 2018-09-28 DIAGNOSIS — E041 Nontoxic single thyroid nodule: Secondary | ICD-10-CM | POA: Diagnosis present

## 2018-09-28 DIAGNOSIS — E0590 Thyrotoxicosis, unspecified without thyrotoxic crisis or storm: Secondary | ICD-10-CM

## 2018-09-28 MED ORDER — SODIUM IODIDE I-123 7.4 MBQ CAPS
449.7000 | ORAL_CAPSULE | Freq: Once | ORAL | Status: AC
  Administered 2018-09-28: 449.7 via ORAL

## 2018-09-29 ENCOUNTER — Encounter
Admission: RE | Admit: 2018-09-29 | Discharge: 2018-09-29 | Disposition: A | Discharge status: Home / Self Care | Payer: BLUE CROSS/BLUE SHIELD | Source: Ambulatory Visit | Attending: "Endocrinology | Admitting: "Endocrinology

## 2018-10-09 ENCOUNTER — Telehealth: Payer: Self-pay | Admitting: Family Medicine

## 2018-10-09 NOTE — Telephone Encounter (Signed)
Copied from [place] [phone]. Topic: General - Other >> Oct 09, 2018 11:29 AM Carolyn Stare wrote: Pt was very talkative and only want to speak with Dr Jeananne Rama  Pt has questions and would like a call back she said she thought the purpose of the test was for her to be given a RX .

## 2018-10-09 NOTE — Telephone Encounter (Signed)
FYI: Patient wants you to know that it is taking the endocrinologist a long time to get back in touch with her and figure out treatment.

## 2018-10-09 NOTE — Telephone Encounter (Signed)
Please let her know that Dr. Jeananne Rama is out of the office until Monday. However, Dr. Honor Junes is managing her diabetes and will be talking to her about the results of her test- if she has questions about that, she should call him

## 2018-10-12 NOTE — Telephone Encounter (Signed)
Courtney Anderson  This talkative pts TSH     37mo ago 62mo ago 79yr ago   TSH 0.450 - 4.500 uIU/mL <0.006Low   <0.006Low   1.410   Resulting Agency  [facility]     She is finally going to get radioactive iodine from endocrinology later this month.

## 2018-10-13 ENCOUNTER — Other Ambulatory Visit: Payer: Self-pay | Admitting: "Endocrinology

## 2018-10-14 ENCOUNTER — Other Ambulatory Visit: Payer: Self-pay | Admitting: "Endocrinology

## 2018-10-14 DIAGNOSIS — E0590 Thyrotoxicosis, unspecified without thyrotoxic crisis or storm: Secondary | ICD-10-CM

## 2018-10-23 ENCOUNTER — Ambulatory Visit
Admission: RE | Admit: 2018-10-23 | Discharge: 2018-10-23 | Disposition: A | Discharge status: Home / Self Care | Payer: BLUE CROSS/BLUE SHIELD | Source: Ambulatory Visit | Attending: "Endocrinology | Admitting: "Endocrinology

## 2018-10-23 DIAGNOSIS — E0590 Thyrotoxicosis, unspecified without thyrotoxic crisis or storm: Secondary | ICD-10-CM | POA: Diagnosis present

## 2018-10-23 MED ORDER — SODIUM IODIDE I 131 CAPSULE
19.9300 | Freq: Once | INTRAVENOUS | Status: AC | PRN
  Administered 2018-10-23: 19.93 via ORAL

## 2018-11-16 ENCOUNTER — Ambulatory Visit: Payer: BLUE CROSS/BLUE SHIELD | Admitting: Family Medicine

## 2018-12-08 ENCOUNTER — Ambulatory Visit: Payer: BLUE CROSS/BLUE SHIELD | Admitting: Family Medicine

## 2019-01-11 ENCOUNTER — Encounter: Payer: Self-pay | Admitting: Family Medicine

## 2019-01-11 ENCOUNTER — Ambulatory Visit: Payer: BLUE CROSS/BLUE SHIELD | Admitting: Family Medicine

## 2019-01-11 VITALS — BP 129/89 | HR 112 | Temp 98.2°F | Ht 64.0 in | Wt 200.0 lb

## 2019-01-11 DIAGNOSIS — Z1329 Encounter for screening for other suspected endocrine disorder: Secondary | ICD-10-CM | POA: Diagnosis not present

## 2019-01-11 DIAGNOSIS — Z1239 Encounter for other screening for malignant neoplasm of breast: Secondary | ICD-10-CM

## 2019-01-11 DIAGNOSIS — Z1211 Encounter for screening for malignant neoplasm of colon: Secondary | ICD-10-CM | POA: Diagnosis not present

## 2019-01-11 DIAGNOSIS — E0590 Thyrotoxicosis, unspecified without thyrotoxic crisis or storm: Secondary | ICD-10-CM

## 2019-01-11 DIAGNOSIS — I10 Essential (primary) hypertension: Secondary | ICD-10-CM | POA: Diagnosis not present

## 2019-01-11 DIAGNOSIS — R000 Tachycardia, unspecified: Secondary | ICD-10-CM

## 2019-01-11 DIAGNOSIS — Z23 Encounter for immunization: Secondary | ICD-10-CM

## 2019-01-11 DIAGNOSIS — M159 Polyosteoarthritis, unspecified: Secondary | ICD-10-CM | POA: Insufficient documentation

## 2019-01-11 MED ORDER — NAPROXEN 500 MG PO TABS: 500.0000 mg | ORAL_TABLET | Freq: Two times a day (BID) | ORAL | 4 refills | Status: DC

## 2019-01-11 MED ORDER — METOPROLOL SUCCINATE ER 50 MG PO TB24: 50.0000 mg | ORAL_TABLET | Freq: Every day | ORAL | 6 refills | Status: DC

## 2019-01-11 NOTE — Progress Notes (Signed)
BP 129/89   Pulse (!) 112   Temp 98.2 F (36.8 C) (Oral)   Ht 5\' 4"  (1.626 m)   Wt 200 lb (90.7 kg)   SpO2 98%   BMI 34.33 kg/m    Subjective:    Patient ID: Courtney Anderson, female    DOB: [birth year], 63 y.o.   MRN: [id number]  HPI: Courtney Anderson is a 63 y.o. female  Chief Complaint  Patient presents with  . Follow-up    Sees Endocrinologist (Dr. Honor Junes) next week  . Hypertension   Patient all in all doing well except for arthritis of her hands on chart review patient had x-rays of both hands showing degenerative arthritis changes.  Patient needs some help around her home opening jars or activities involving hands which limits her.  Needs some help with identifying various aids with decreased hand strength. We will set up with consult for help. Patient about to settle down from her thyroid status has had radioactive iodine and doing better. Blood pressure staying well controlled without problems. Same with cholesterol. Patient weights been going up and down with thyroid will observe and continue working at her weight.  Relevant past medical, surgical, family and social history reviewed and updated as indicated. Interim medical history since our last visit reviewed. Allergies and medications reviewed and updated.  Review of Systems  Constitutional: Negative.   Respiratory: Negative.   Cardiovascular: Negative.     Per HPI unless specifically indicated above     Objective:    BP 129/89   Pulse (!) 112   Temp 98.2 F (36.8 C) (Oral)   Ht 5\' 4"  (1.626 m)   Wt 200 lb (90.7 kg)   SpO2 98%   BMI 34.33 kg/m   Wt Readings from Last 3 Encounters:  01/11/19 200 lb (90.7 kg)  05/13/18 192 lb (87.1 kg)  08/29/17 211 lb (95.7 kg)    Physical Exam Constitutional:      Appearance: She is well-developed.  HENT:     Head: Normocephalic and atraumatic.  Eyes:     Conjunctiva/sclera: Conjunctivae normal.  Neck:     Musculoskeletal: Normal range of motion.    Cardiovascular:     Rate and Rhythm: Normal rate and regular rhythm.     Heart sounds: Normal heart sounds.  Pulmonary:     Effort: Pulmonary effort is normal.     Breath sounds: Normal breath sounds.  Musculoskeletal: Normal range of motion.  Skin:    Findings: No erythema.  Neurological:     Mental Status: She is alert and oriented to person, place, and time.  Psychiatric:        Behavior: Behavior normal.        Thought Content: Thought content normal.        Judgment: Judgment normal.     Results for orders placed or performed in visit on 08/31/18  Cologuard  Result Value Ref Range   Cologuard        Assessment & Plan:   Problem List Items Addressed This Visit      Cardiovascular and Mediastinum   Hypertension - Primary    The current medical regimen is effective;  continue present plan and medications.       Relevant Medications   metoprolol succinate (TOPROL-XL) 50 MG 24 hr tablet     Endocrine   Hyperthyroidism    Followed by endocrinology and stable      Relevant Medications   metoprolol succinate (TOPROL-XL)  50 MG 24 hr tablet     Musculoskeletal and Integument   Generalized osteoarthritis of hand    Patient with difficulty with some activities like opening jars needs assistance and some aids to help around the house with limited hand strength.  Will set up for consult and referral for help.      Relevant Medications   naproxen (NAPROSYN) 500 MG tablet     Other   Tachycardia   Relevant Medications   metoprolol succinate (TOPROL-XL) 50 MG 24 hr tablet    Other Visit Diagnoses    Thyroid disorder screen       Need for Tdap vaccination       Colon cancer screening       Relevant Orders   Cologuard   Breast cancer screening       Relevant Orders   MM DIGITAL SCREENING BILATERAL       Follow up plan: Return in about 3 months (around 04/13/2019) for Physical Exam.

## 2019-01-11 NOTE — Assessment & Plan Note (Signed)
Followed by endocrinology and stable 

## 2019-01-11 NOTE — Assessment & Plan Note (Signed)
The current medical regimen is effective;  continue present plan and medications.  

## 2019-01-11 NOTE — Progress Notes (Signed)
Cologuard order placed via online portal.

## 2019-01-11 NOTE — Assessment & Plan Note (Signed)
Patient with difficulty with some activities like opening jars needs assistance and some aids to help around the house with limited hand strength.  Will set up for consult and referral for help.

## 2019-01-11 NOTE — Addendum Note (Signed)
Addended by: Golden Pop A on: 01/11/2019 11:14 AM   Modules accepted: Orders

## 2019-01-13 ENCOUNTER — Ambulatory Visit: Payer: Self-pay | Admitting: *Deleted

## 2019-01-13 NOTE — Telephone Encounter (Addendum)
Message from Sheran Luz sent at 01/13/2019 3:58 PM EDT  Discussed medicines with patient metoprolol for tachycardia Naprosyn replacing the meloxicam discussed dosage. Summary: med question    Patient requests to speak with nurse regarding naproxen (NAPROSYN) 500 MG tablet. She just states that she has questions.           Returned call to patient regarding naprosyn 500 mg tab. She really wants to know why the dosage is 500. She "has been doing so good with her regime and would like to continue with what was working for her".  Explained to patient the reason for taking naprosyn and how it is normally prescribed. Pt voice understanding. Then she wanted to know why the metoprolol succinate was increased to 50 mg. She stated she had been on the 25 mg tab and it was working for her. She is requesting a call back to explain these medications. Routing to flow at [facility] for review.  Reason for Disposition . Caller has NON-URGENT medication question about med that PCP prescribed and triager unable to answer question  Answer Assessment - Initial Assessment Questions 1. SYMPTOMS: "Do you have any symptoms?"     no 2. SEVERITY: If symptoms are present, ask "Are they mild, moderate or severe?"     none  Protocols used: MEDICATION QUESTION CALL-A-AH

## 2019-01-25 ENCOUNTER — Telehealth: Payer: Self-pay

## 2019-02-02 ENCOUNTER — Other Ambulatory Visit: Payer: Self-pay

## 2019-02-02 ENCOUNTER — Ambulatory Visit (INDEPENDENT_AMBULATORY_CARE_PROVIDER_SITE_OTHER): Payer: BLUE CROSS/BLUE SHIELD | Admitting: Licensed Clinical Social Worker

## 2019-02-02 DIAGNOSIS — I10 Essential (primary) hypertension: Secondary | ICD-10-CM

## 2019-02-02 DIAGNOSIS — M159 Polyosteoarthritis, unspecified: Secondary | ICD-10-CM | POA: Diagnosis not present

## 2019-02-02 NOTE — Patient Instructions (Signed)
Licensed Clinical Social Worker Visit Information  Materials provided: No: Patient declined  Courtney Anderson was given information about Chronic Care Management services today including:  1. CCM service includes personalized support from designated clinical staff supervised by her physician, including individualized plan of care and coordination with other care providers 2. 24/7 contact phone numbers for assistance for urgent and routine care needs. 3. Service will only be billed when office clinical staff spend 20 minutes or more in a month to coordinate care. 4. Only one practitioner may furnish and bill the service in a calendar month. 5. The patient may stop CCM services at any time (effective at the end of the month) by phone call to the office staff. 6. The patient will be responsible for cost sharing (co-pay) of up to 20% of the service fee (after annual deductible is met).  Patient agreed to services and verbal consent obtained.   The patient verbalized understanding of instructions provided today and declined a print copy of patient instruction materials.   Follow up plan: Client will reach out to CCM program when case management needs arise  [name], BSW, MSW, [email or web address] Phone: [phone]

## 2019-02-02 NOTE — Chronic Care Management (AMB) (Signed)
  Chronic Care Management    Clinical Social Work General Note  02/02/2019 Name: Courtney Anderson MRN: [id number] DOB: Aug 03, 1956  [name] is a 63 y.o. year old female who is a primary care patient of Crissman, Jeannette How, MD. The CCM was consulted to assist the patient with [employer]. LCSW spoke with patient and received HIPPA verifications successfully. LCSW introduced self, reason for call and of CCM program. Patient reports that she is agreeable to services but has no current case management needs or goals that she wishes to work on at this time reporting that she is doing very well and receiving ongoing support/care from son. However, patient reports that she prefers to do for herself and does so when able. Patient agreeable to contact CCM program if any future case management needs arise.   [name] was given information about Chronic Care Management services today including:  1. CCM service includes personalized support from designated clinical staff supervised by her physician, including individualized plan of care and coordination with other care providers 2. 24/7 contact phone numbers for assistance for urgent and routine care needs. 3. Service will only be billed when office clinical staff spend 20 minutes or more in a month to coordinate care. 4. Only one practitioner may furnish and bill the service in a calendar month. 5. The patient may stop CCM services at any time (effective at the end of the month) by phone call to the office staff. 6. The patient will be responsible for cost sharing (co-pay) of up to 20% of the service fee (after annual deductible is met).  Patient agreed to services and verbal consent obtained.   Review of patient status, including review of consultants reports, relevant laboratory and other test results, and collaboration with appropriate care team members and the patient's provider was performed as part of comprehensive patient evaluation and  provision of chronic care management services.    Follow Up Plan: Client will contact CCM program if any case management needs arise  [name], BSW, MSW, [email or web address] Phone: [phone]

## 2019-05-05 ENCOUNTER — Encounter: Payer: BLUE CROSS/BLUE SHIELD | Admitting: Family Medicine

## 2019-05-18 ENCOUNTER — Other Ambulatory Visit: Payer: Self-pay | Admitting: Family Medicine

## 2019-05-18 DIAGNOSIS — R000 Tachycardia, unspecified: Secondary | ICD-10-CM

## 2019-05-18 DIAGNOSIS — I10 Essential (primary) hypertension: Secondary | ICD-10-CM

## 2019-05-18 NOTE — Telephone Encounter (Signed)
Courtesy refill  

## 2019-05-22 ENCOUNTER — Other Ambulatory Visit: Payer: Self-pay | Admitting: Family Medicine

## 2019-05-22 DIAGNOSIS — R000 Tachycardia, unspecified: Secondary | ICD-10-CM

## 2019-05-22 DIAGNOSIS — I10 Essential (primary) hypertension: Secondary | ICD-10-CM

## 2019-05-27 NOTE — Telephone Encounter (Addendum)
error:[id number] ° °

## 2019-06-02 ENCOUNTER — Encounter: Payer: BLUE CROSS/BLUE SHIELD | Admitting: Nurse Practitioner

## 2019-06-04 ENCOUNTER — Other Ambulatory Visit: Payer: Self-pay | Admitting: Family Medicine

## 2019-06-04 ENCOUNTER — Telehealth: Payer: Self-pay | Admitting: Family Medicine

## 2019-06-04 DIAGNOSIS — I10 Essential (primary) hypertension: Secondary | ICD-10-CM

## 2019-06-04 MED ORDER — OLMESARTAN-AMLODIPINE-HCTZ 40-10-25 MG PO TABS: 1.0000 | ORAL_TABLET | Freq: Every day | ORAL | 0 refills | Status: DC

## 2019-06-04 NOTE — Telephone Encounter (Signed)
Requested Prescriptions  Pending Prescriptions Disp Refills  . Olmesartan-amLODIPine-HCTZ 40-10-25 MG TABS [Pharmacy Med Name: OLMSRTN-AMLDPN-HCTZ 40-10-25MG ] 30 tablet 0    Sig: TAKE 1 TABLET BY MOUTH EVERY DAY     There is no refill protocol information for this order    Patient is scheduled for follow up with Dr. Jeananne Rama 07/02/2019.  30 day courtesy supply of medication sent to last patient until appointment.

## 2019-06-04 NOTE — Telephone Encounter (Signed)
Pt.notified

## 2019-06-04 NOTE — Telephone Encounter (Signed)
It looks like Dr. Jeananne Rama refilled this today. Please make sure she got it

## 2019-06-04 NOTE — Telephone Encounter (Signed)
Pt would like a refill for her olmesartan to last her until her 07/02/2019 appt. Pt uses [facility]

## 2019-06-04 NOTE — Telephone Encounter (Signed)
Pt called very upset that her olmesartan has not been refilled yet. Advised pt that Dr. Jeananne Rama has been sent refill request. Please advise.

## 2019-06-04 NOTE — Telephone Encounter (Signed)
Patient is calling back extremely upset that her medication was not sent to the pharmacy. Patient was advised previously to call back around lunch time. If the pharmacy did not receive the script. Patient discontinued the phone call before she could be advised that the medication was sent.   Please advise.

## 2019-06-07 NOTE — Telephone Encounter (Signed)
Spoke with pt and explained that the medication had been sent in again but by Dr Wynetta Emery this time. Pt stated that she thinks there may be an issue with the pharmacy as we had spoke and she knew that we had sent it in. Pt was able to pick up prescription.

## 2019-07-02 ENCOUNTER — Other Ambulatory Visit: Payer: Self-pay | Admitting: Nurse Practitioner

## 2019-07-02 ENCOUNTER — Other Ambulatory Visit: Payer: Self-pay

## 2019-07-02 ENCOUNTER — Encounter: Payer: BLUE CROSS/BLUE SHIELD | Admitting: Nurse Practitioner

## 2019-07-02 DIAGNOSIS — I10 Essential (primary) hypertension: Secondary | ICD-10-CM

## 2019-07-02 DIAGNOSIS — R000 Tachycardia, unspecified: Secondary | ICD-10-CM

## 2019-07-02 MED ORDER — OLMESARTAN-AMLODIPINE-HCTZ 40-10-25 MG PO TABS: 1.0000 | ORAL_TABLET | Freq: Every day | ORAL | 0 refills | Status: DC

## 2019-07-02 MED ORDER — METOPROLOL SUCCINATE ER 50 MG PO TB24: 50.0000 mg | ORAL_TABLET | Freq: Every day | ORAL | 2 refills | Status: DC

## 2019-07-02 MED ORDER — OLMESARTAN-AMLODIPINE-HCTZ 40-10-25 MG PO TABS: 1.0000 | ORAL_TABLET | Freq: Every day | ORAL | 2 refills | Status: DC

## 2019-07-02 NOTE — Telephone Encounter (Signed)
Called patient and did script screening. While on the phone, patient states that her insurance is going to change at the end of the month and she is requesting that her medications be sent in so she does not run out and half to pay full price with the insurance change. She is requesting 6 month supplies and has appointment 07/05/19.

## 2019-07-05 ENCOUNTER — Encounter: Payer: BLUE CROSS/BLUE SHIELD | Admitting: Nurse Practitioner

## 2019-08-09 ENCOUNTER — Encounter: Payer: Self-pay | Admitting: Nurse Practitioner

## 2019-09-24 ENCOUNTER — Encounter: Payer: Self-pay | Admitting: Nurse Practitioner

## 2019-10-17 ENCOUNTER — Encounter: Payer: Self-pay | Admitting: Nurse Practitioner

## 2019-10-17 DIAGNOSIS — E559 Vitamin D deficiency, unspecified: Secondary | ICD-10-CM | POA: Insufficient documentation

## 2019-10-22 ENCOUNTER — Encounter: Payer: Self-pay | Admitting: Nurse Practitioner

## 2019-10-22 ENCOUNTER — Other Ambulatory Visit: Payer: Self-pay

## 2019-10-22 ENCOUNTER — Ambulatory Visit (INDEPENDENT_AMBULATORY_CARE_PROVIDER_SITE_OTHER): Payer: Self-pay | Admitting: Nurse Practitioner

## 2019-10-22 DIAGNOSIS — I10 Essential (primary) hypertension: Secondary | ICD-10-CM

## 2019-10-22 DIAGNOSIS — E0590 Thyrotoxicosis, unspecified without thyrotoxic crisis or storm: Secondary | ICD-10-CM

## 2019-10-22 DIAGNOSIS — E042 Nontoxic multinodular goiter: Secondary | ICD-10-CM

## 2019-10-22 DIAGNOSIS — E782 Mixed hyperlipidemia: Secondary | ICD-10-CM

## 2019-10-22 DIAGNOSIS — Z23 Encounter for immunization: Secondary | ICD-10-CM

## 2019-10-22 DIAGNOSIS — E559 Vitamin D deficiency, unspecified: Secondary | ICD-10-CM

## 2019-10-22 NOTE — Assessment & Plan Note (Signed)
Continue collaboration with endocrinology and review of notes.

## 2019-10-22 NOTE — Assessment & Plan Note (Signed)
Recheck Vit D level today and continue current supplement.  Continue collaboration with endocrinology.

## 2019-10-22 NOTE — Assessment & Plan Note (Signed)
Recommend continued focus on healthy diet choices and regular physical activity (30 minutes 5 days a week).  Focus on small goals at a time.

## 2019-10-22 NOTE — Progress Notes (Addendum)
BP 121/88   Pulse 76   Temp 98 F (36.7 C) (Oral)   Ht 5\' 5"  (1.651 m)   Wt 242 lb (109.8 kg)   SpO2 97%   BMI 40.27 kg/m    Subjective:    Patient ID: Courtney Anderson, female    DOB: [birth year], 63 y.o.   MRN: [id number]  HPI: Courtney Anderson is a 63 y.o. female  Chief Complaint  Patient presents with  . Hypertension   HYPERTENSION / HYPERLIPIDEMIA Continues on Olmesartan-Amlodipine-HCTZ 40-10-25 daily, costing $70 a month.  She does not start new insurance until June.  Metoprolol XL 50 MG daily.  Looked on [facility] and reviewed with patient, if she separates BP medication and goes to [facility] would be $16.00 a month. Satisfied with current treatment? yes Duration of hypertension: chronic BP monitoring frequency: daily BP range: 120/80 range at home BP medication side effects: no Duration of hyperlipidemia: chronic Cholesterol supplements: none Medication compliance: good compliance Aspirin: yes Recent stressors: no Recurrent headaches: no Visual changes: no Palpitations: no Dyspnea: no Chest pain: no Lower extremity edema: no Dizzy/lightheaded: no  The 10-year ASCVD risk score Mikey Bussing DC Jr., et al., 2013) is: 6.9%   Values used to calculate the score:     Age: 63 years     Sex: Female     Is Non-Hispanic African American: Yes     Diabetic: No     Tobacco smoker: No     Systolic Blood Pressure: 123XX123 mmHg     Is BP treated: Yes     HDL Cholesterol: 50 mg/dL     Total Cholesterol: 175 mg/dL   HYPERTHYROIDISM & HYPERCALCEMIA: Followed by Dr. Honor Junes.  Last saw 04/30/2019.  She did not want surgery and had radioiodine therapy instead in 10/2018.  She is to have thyroid levels rechecked by endo and may require Levothyroxine on review of note from June.  Her recent calcium with them was normal, but PTH elevated.  Has multinodular goiter "obvious nodules on the right but does have 3 nodules on the left", Dr. Honor Junes plans for repeat u/s 12 months out from her  therapy.   VITAMIN D DEFICIENCY: Takes daily supplements, 2000 units daily.  No recent falls or fractures.  Relevant past medical, surgical, family and social history reviewed and updated as indicated. Interim medical history since our last visit reviewed. Allergies and medications reviewed and updated.  Review of Systems  Constitutional: Negative for activity change, appetite change, diaphoresis, fatigue and fever.  Respiratory: Negative for cough, chest tightness and shortness of breath.   Cardiovascular: Negative for chest pain, palpitations and leg swelling.  Gastrointestinal: Negative.   Endocrine: Negative for cold intolerance and heat intolerance.  Neurological: Negative.   Psychiatric/Behavioral: Negative.     Per HPI unless specifically indicated above     Objective:    BP 121/88   Pulse 76   Temp 98 F (36.7 C) (Oral)   Ht 5\' 5"  (1.651 m)   Wt 242 lb (109.8 kg)   SpO2 97%   BMI 40.27 kg/m   Wt Readings from Last 3 Encounters:  10/22/19 242 lb (109.8 kg)  01/11/19 200 lb (90.7 kg)  05/13/18 192 lb (87.1 kg)    Physical Exam Vitals and nursing note reviewed.  Constitutional:      General: She is awake. She is not in acute distress.    Appearance: She is well-developed. She is morbidly obese. She is not ill-appearing.  HENT:  Head: Normocephalic.     Right Ear: Hearing normal.     Left Ear: Hearing normal.  Eyes:     General: Lids are normal.        Right eye: No discharge.        Left eye: No discharge.     Conjunctiva/sclera: Conjunctivae normal.     Pupils: Pupils are equal, round, and reactive to light.  Neck:     Thyroid: No thyromegaly.     Vascular: No carotid bruit.  Cardiovascular:     Rate and Rhythm: Normal rate and regular rhythm.     Heart sounds: Normal heart sounds. No murmur. No gallop.   Pulmonary:     Effort: Pulmonary effort is normal. No accessory muscle usage or respiratory distress.     Breath sounds: Normal breath sounds.    Abdominal:     General: Bowel sounds are normal.     Palpations: Abdomen is soft.  Musculoskeletal:     Cervical back: Normal range of motion and neck supple.     Right lower leg: No edema.     Left lower leg: No edema.  Skin:    General: Skin is warm and dry.  Neurological:     Mental Status: She is alert and oriented to person, place, and time.  Psychiatric:        Attention and Perception: Attention normal.        Mood and Affect: Mood normal.        Behavior: Behavior normal. Behavior is cooperative.        Thought Content: Thought content normal.        Judgment: Judgment normal.     Results for orders placed or performed in visit on 10/22/19  Comprehensive metabolic panel  Result Value Ref Range   Glucose 106 (H) 65 - 99 mg/dL   BUN 13 8 - 27 mg/dL   Creatinine, Ser 0.97 0.57 - 1.00 mg/dL   GFR calc non Af Amer 62 >59 mL/min/1.73   GFR calc Af Amer 71 >59 mL/min/1.73   BUN/Creatinine Ratio 13 12 - 28   Sodium 139 134 - 144 mmol/L   Potassium 3.6 3.5 - 5.2 mmol/L   Chloride 100 96 - 106 mmol/L   CO2 28 20 - 29 mmol/L   Calcium 10.4 (H) 8.7 - 10.3 mg/dL   Total Protein 7.5 6.0 - 8.5 g/dL   Albumin 4.2 3.8 - 4.8 g/dL   Globulin, Total 3.3 1.5 - 4.5 g/dL   Albumin/Globulin Ratio 1.3 1.2 - 2.2   Bilirubin Total 0.4 0.0 - 1.2 mg/dL   Alkaline Phosphatase 105 39 - 117 IU/L   AST 21 0 - 40 IU/L   ALT 19 0 - 32 IU/L  Lipid Panel w/o Chol/HDL Ratio out  Result Value Ref Range   Cholesterol, Total 175 100 - 199 mg/dL   Triglycerides 81 0 - 149 mg/dL   HDL 50 >39 mg/dL   VLDL Cholesterol Cal 15 5 - 40 mg/dL   LDL Chol Calc (NIH) 110 (H) 0 - 99 mg/dL  Vit D  25 hydroxy (rtn osteoporosis monitoring)  Result Value Ref Range   Vit D, 25-Hydroxy 26.4 (L) 30.0 - 100.0 ng/mL      Assessment & Plan:   Problem List Items Addressed This Visit      Cardiovascular and Mediastinum   Hypertension    Chronic, ongoing with below goal BP in office today and on home readings.   Continue current  medication regimen.  Pulled up [facility] with her and may be cheaper option for her to separate her combo pill and obtain each medication at [facility].  She is going to look into this and alert provider if she wishes to change to this option.  Continue current regimen and checking BP at home daily.  CMP today.  Return to office in 6 months.      Relevant Orders   Comprehensive metabolic panel (Completed)     Endocrine   Hyperthyroidism    Continue collaboration with Dr. Honor Junes and review notes.      Multinodular goiter    Continue collaboration with endocrinology, Dr. Honor Junes.        Other   Morbid obesity ([place]) - Primary    Recommend continued focus on healthy diet choices and regular physical activity (30 minutes 5 days a week).  Focus on small goals at a time.      Hyperlipidemia    Chronic with no current medications.  ASCVD 5.9%.  Check lipid panel today and initiate medication as needed.  Continue diet focus at this time.      Relevant Orders   Comprehensive metabolic panel (Completed)   Lipid Panel w/o Chol/HDL Ratio out (Completed)   Hypercalcemia    Continue collaboration with endocrinology and review of notes.      Vitamin D deficiency    Recheck Vit D level today and continue current supplement.  Continue collaboration with endocrinology.      Relevant Orders   Vit D  25 hydroxy (rtn osteoporosis monitoring) (Completed)    Other Visit Diagnoses    Need for influenza vaccination       Relevant Orders   Flu Vaccine QUAD 36+ mos PF IM (Fluarix & Fluzone Quad PF) (Completed)       Follow up plan: Return in about 6 months (around 04/21/2020) for HTN/HLD, Vit D.

## 2019-10-22 NOTE — Patient Instructions (Signed)

## 2019-10-22 NOTE — Assessment & Plan Note (Signed)
Continue collaboration with endocrinology, Dr. Honor Junes.

## 2019-10-22 NOTE — Assessment & Plan Note (Signed)
Continue collaboration with Dr. Honor Junes and review notes.

## 2019-10-22 NOTE — Assessment & Plan Note (Signed)
Chronic, ongoing with below goal BP in office today and on home readings.  Continue current medication regimen.  Pulled up [facility] with her and may be cheaper option for her to separate her combo pill and obtain each medication at [facility].  She is going to look into this and alert provider if she wishes to change to this option.  Continue current regimen and checking BP at home daily.  CMP today.  Return to office in 6 months.

## 2019-10-23 LAB — LIPID PANEL W/O CHOL/HDL RATIO
Cholesterol, Total: 175 mg/dL (ref 100–199)
HDL: 50 mg/dL (ref >39)
LDL Chol Calc (NIH): 110 mg/dL — ABNORMAL HIGH (ref 0–99)
Triglycerides: 81 mg/dL (ref 0–149)
VLDL Cholesterol Cal: 15 mg/dL (ref 5–40)

## 2019-10-23 LAB — COMPREHENSIVE METABOLIC PANEL
ALT: 19 IU/L (ref 0–32)
AST: 21 IU/L (ref 0–40)
Albumin/Globulin Ratio: 1.3 (ref 1.2–2.2)
Albumin: 4.2 g/dL (ref 3.8–4.8)
Alkaline Phosphatase: 105 IU/L (ref 39–117)
BUN/Creatinine Ratio: 13 (ref 12–28)
BUN: 13 mg/dL (ref 8–27)
Bilirubin Total: 0.4 mg/dL (ref 0.0–1.2)
CO2: 28 mmol/L (ref 20–29)
Calcium: 10.4 mg/dL — ABNORMAL HIGH (ref 8.7–10.3)
Chloride: 100 mmol/L (ref 96–106)
Creatinine, Ser: 0.97 mg/dL (ref 0.57–1.00)
GFR calc Af Amer: 71 mL/min/{1.73_m2} (ref >59)
GFR calc non Af Amer: 62 mL/min/{1.73_m2} (ref >59)
Globulin, Total: 3.3 g/dL (ref 1.5–4.5)
Glucose: 106 mg/dL — ABNORMAL HIGH (ref 65–99)
Potassium: 3.6 mmol/L (ref 3.5–5.2)
Sodium: 139 mmol/L (ref 134–144)
Total Protein: 7.5 g/dL (ref 6.0–8.5)

## 2019-10-23 LAB — VITAMIN D 25 HYDROXY (VIT D DEFICIENCY, FRACTURES): Vit D, 25-Hydroxy: 26.4 ng/mL — ABNORMAL LOW (ref 30.0–100.0)

## 2019-10-24 NOTE — Assessment & Plan Note (Signed)
Chronic with no current medications.  ASCVD 5.9%.  Check lipid panel today and initiate medication as needed.  Continue diet focus at this time.

## 2019-10-24 NOTE — Progress Notes (Signed)
Good morning.  Please let Courtney Anderson know labs have returned: - Calcium level has trended down some, continue to follow with Dr. Honor Junes - Cholesterol levels, LDL (bad cholesterol number) is still elevated greater than 100 at 110.  Although risk score remains low for cardiac event.  Recommend continued diet focus at this time and if elevation in this risk over time may consider medication. - Vitamin D level remains slightly low at 26.4, continue daily supplement. - Kidney function looks great!! Have a Merry Christmas and Happy New Year.  Let me know about blood pressure medication and cost findings.

## 2020-06-16 ENCOUNTER — Other Ambulatory Visit: Payer: Self-pay | Admitting: Nurse Practitioner

## 2020-06-16 DIAGNOSIS — I10 Essential (primary) hypertension: Secondary | ICD-10-CM

## 2020-06-16 NOTE — Telephone Encounter (Signed)
Requested  medications are  due for refill today yes  Requested medications are on the active medication list yes  Last refill 7/21  Last visit 10/2019  Future visit scheduled No  Notes to clinic Failed protocol of visit within 6 months, no upcoming visit scheduled.

## 2020-06-16 NOTE — Telephone Encounter (Signed)
NUMBER HAS BEEN DISCONNECTED AT THE TIME OF CALL WILL POSTPONE AND TRY ON Monday 06/16/2020

## 2020-06-19 ENCOUNTER — Other Ambulatory Visit: Payer: Self-pay | Admitting: Family Medicine

## 2020-06-19 DIAGNOSIS — I10 Essential (primary) hypertension: Secondary | ICD-10-CM

## 2020-06-19 DIAGNOSIS — R000 Tachycardia, unspecified: Secondary | ICD-10-CM

## 2020-06-19 MED ORDER — METOPROLOL SUCCINATE ER 50 MG PO TB24: 50.0000 mg | ORAL_TABLET | Freq: Every day | ORAL | 0 refills | Status: DC

## 2020-06-19 NOTE — Telephone Encounter (Signed)
Pt need a refill  Compensation 40-10-25 MG TABS [[id number]]  metoprolol succinate (TOPROL-XL) 50 MG 24 hr tablet [[id number]]  [id number] - [place], [place] - [address]  [address], [place]  [address]  Phone:  [phone] Fax:  [phone]

## 2020-07-18 ENCOUNTER — Encounter: Payer: Self-pay | Admitting: Nurse Practitioner

## 2020-07-24 ENCOUNTER — Inpatient Hospital Stay
Admission: EM | Admit: 2020-07-24 | Discharge: 2020-08-28 | DRG: 870 | Disposition: A | Discharge status: Home Health | Payer: Medicare Other | Attending: Internal Medicine | Admitting: Internal Medicine

## 2020-07-24 ENCOUNTER — Emergency Department: Payer: Medicare Other

## 2020-07-24 ENCOUNTER — Ambulatory Visit: Payer: Self-pay | Admitting: Nurse Practitioner

## 2020-07-24 ENCOUNTER — Encounter: Payer: Self-pay | Admitting: Emergency Medicine

## 2020-07-24 ENCOUNTER — Other Ambulatory Visit: Payer: Self-pay

## 2020-07-24 DIAGNOSIS — Z01818 Encounter for other preprocedural examination: Secondary | ICD-10-CM

## 2020-07-24 DIAGNOSIS — I5031 Acute diastolic (congestive) heart failure: Secondary | ICD-10-CM | POA: Diagnosis not present

## 2020-07-24 DIAGNOSIS — Z66 Do not resuscitate: Secondary | ICD-10-CM | POA: Diagnosis not present

## 2020-07-24 DIAGNOSIS — L98499 Non-pressure chronic ulcer of skin of other sites with unspecified severity: Secondary | ICD-10-CM

## 2020-07-24 DIAGNOSIS — L8990 Pressure ulcer of unspecified site, unspecified stage: Secondary | ICD-10-CM | POA: Insufficient documentation

## 2020-07-24 DIAGNOSIS — Z4659 Encounter for fitting and adjustment of other gastrointestinal appliance and device: Secondary | ICD-10-CM

## 2020-07-24 DIAGNOSIS — J9601 Acute respiratory failure with hypoxia: Secondary | ICD-10-CM

## 2020-07-24 DIAGNOSIS — N170 Acute kidney failure with tubular necrosis: Secondary | ICD-10-CM | POA: Diagnosis present

## 2020-07-24 DIAGNOSIS — I10 Essential (primary) hypertension: Secondary | ICD-10-CM | POA: Diagnosis present

## 2020-07-24 DIAGNOSIS — J690 Pneumonitis due to inhalation of food and vomit: Secondary | ICD-10-CM | POA: Diagnosis not present

## 2020-07-24 DIAGNOSIS — J80 Acute respiratory distress syndrome: Secondary | ICD-10-CM | POA: Diagnosis present

## 2020-07-24 DIAGNOSIS — Z87891 Personal history of nicotine dependence: Secondary | ICD-10-CM

## 2020-07-24 DIAGNOSIS — A419 Sepsis, unspecified organism: Secondary | ICD-10-CM | POA: Diagnosis present

## 2020-07-24 DIAGNOSIS — E876 Hypokalemia: Secondary | ICD-10-CM | POA: Diagnosis present

## 2020-07-24 DIAGNOSIS — J1282 Pneumonia due to coronavirus disease 2019: Secondary | ICD-10-CM | POA: Diagnosis present

## 2020-07-24 DIAGNOSIS — I452 Bifascicular block: Secondary | ICD-10-CM | POA: Diagnosis present

## 2020-07-24 DIAGNOSIS — Z0189 Encounter for other specified special examinations: Secondary | ICD-10-CM

## 2020-07-24 DIAGNOSIS — E785 Hyperlipidemia, unspecified: Secondary | ICD-10-CM | POA: Diagnosis present

## 2020-07-24 DIAGNOSIS — N179 Acute kidney failure, unspecified: Secondary | ICD-10-CM

## 2020-07-24 DIAGNOSIS — I110 Hypertensive heart disease with heart failure: Secondary | ICD-10-CM | POA: Diagnosis present

## 2020-07-24 DIAGNOSIS — Z6841 Body Mass Index (BMI) 40.0 and over, adult: Secondary | ICD-10-CM | POA: Diagnosis not present

## 2020-07-24 DIAGNOSIS — G928 Other toxic encephalopathy: Secondary | ICD-10-CM | POA: Diagnosis not present

## 2020-07-24 DIAGNOSIS — A4189 Other specified sepsis: Principal | ICD-10-CM | POA: Diagnosis present

## 2020-07-24 DIAGNOSIS — T17908A Unspecified foreign body in respiratory tract, part unspecified causing other injury, initial encounter: Secondary | ICD-10-CM

## 2020-07-24 DIAGNOSIS — Z978 Presence of other specified devices: Secondary | ICD-10-CM

## 2020-07-24 DIAGNOSIS — R000 Tachycardia, unspecified: Secondary | ICD-10-CM | POA: Diagnosis not present

## 2020-07-24 DIAGNOSIS — E039 Hypothyroidism, unspecified: Secondary | ICD-10-CM | POA: Diagnosis present

## 2020-07-24 DIAGNOSIS — Z79899 Other long term (current) drug therapy: Secondary | ICD-10-CM

## 2020-07-24 DIAGNOSIS — E041 Nontoxic single thyroid nodule: Secondary | ICD-10-CM | POA: Diagnosis present

## 2020-07-24 DIAGNOSIS — R6521 Severe sepsis with septic shock: Secondary | ICD-10-CM | POA: Diagnosis present

## 2020-07-24 DIAGNOSIS — I4819 Other persistent atrial fibrillation: Secondary | ICD-10-CM | POA: Diagnosis not present

## 2020-07-24 DIAGNOSIS — E0590 Thyrotoxicosis, unspecified without thyrotoxic crisis or storm: Secondary | ICD-10-CM | POA: Diagnosis present

## 2020-07-24 DIAGNOSIS — R6520 Severe sepsis without septic shock: Secondary | ICD-10-CM | POA: Diagnosis not present

## 2020-07-24 DIAGNOSIS — L304 Erythema intertrigo: Secondary | ICD-10-CM | POA: Diagnosis present

## 2020-07-24 DIAGNOSIS — Z515 Encounter for palliative care: Secondary | ICD-10-CM | POA: Diagnosis not present

## 2020-07-24 DIAGNOSIS — U071 COVID-19: Secondary | ICD-10-CM | POA: Diagnosis present

## 2020-07-24 DIAGNOSIS — I4891 Unspecified atrial fibrillation: Secondary | ICD-10-CM | POA: Diagnosis present

## 2020-07-24 DIAGNOSIS — J9690 Respiratory failure, unspecified, unspecified whether with hypoxia or hypercapnia: Secondary | ICD-10-CM

## 2020-07-24 DIAGNOSIS — Z7189 Other specified counseling: Secondary | ICD-10-CM | POA: Diagnosis not present

## 2020-07-24 DIAGNOSIS — J9621 Acute and chronic respiratory failure with hypoxia: Secondary | ICD-10-CM | POA: Diagnosis not present

## 2020-07-24 DIAGNOSIS — L98492 Non-pressure chronic ulcer of skin of other sites with fat layer exposed: Secondary | ICD-10-CM | POA: Diagnosis not present

## 2020-07-24 DIAGNOSIS — J9600 Acute respiratory failure, unspecified whether with hypoxia or hypercapnia: Secondary | ICD-10-CM | POA: Diagnosis not present

## 2020-07-24 DIAGNOSIS — E870 Hyperosmolality and hypernatremia: Secondary | ICD-10-CM | POA: Diagnosis not present

## 2020-07-24 DIAGNOSIS — Z23 Encounter for immunization: Secondary | ICD-10-CM

## 2020-07-24 DIAGNOSIS — Z7982 Long term (current) use of aspirin: Secondary | ICD-10-CM

## 2020-07-24 DIAGNOSIS — Z8639 Personal history of other endocrine, nutritional and metabolic disease: Secondary | ICD-10-CM | POA: Diagnosis present

## 2020-07-24 LAB — COMPREHENSIVE METABOLIC PANEL
ALT: 54 U/L — ABNORMAL HIGH (ref 0–44)
AST: 60 U/L — ABNORMAL HIGH (ref 15–41)
Albumin: 2.7 g/dL — ABNORMAL LOW (ref 3.5–5.0)
Alkaline Phosphatase: 70 U/L (ref 38–126)
Anion gap: 18 — ABNORMAL HIGH (ref 5–15)
BUN: 74 mg/dL — ABNORMAL HIGH (ref 8–23)
CO2: 20 mmol/L — ABNORMAL LOW (ref 22–32)
Calcium: 10.4 mg/dL — ABNORMAL HIGH (ref 8.9–10.3)
Chloride: 94 mmol/L — ABNORMAL LOW (ref 98–111)
Creatinine, Ser: 2.81 mg/dL — ABNORMAL HIGH (ref 0.44–1.00)
GFR calc Af Amer: 20 mL/min — ABNORMAL LOW (ref >60)
GFR calc non Af Amer: 17 mL/min — ABNORMAL LOW (ref >60)
Glucose, Bld: 152 mg/dL — ABNORMAL HIGH (ref 70–99)
Potassium: 3 mmol/L — ABNORMAL LOW (ref 3.5–5.1)
Sodium: 132 mmol/L — ABNORMAL LOW (ref 135–145)
Total Bilirubin: 1.1 mg/dL (ref 0.3–1.2)
Total Protein: 8.1 g/dL (ref 6.5–8.1)

## 2020-07-24 LAB — LACTIC ACID, PLASMA
Lactic Acid, Venous: 3.9 mmol/L (ref 0.5–1.9)
Lactic Acid, Venous: 2.5 mmol/L (ref 0.5–1.9)

## 2020-07-24 LAB — PROTIME-INR
INR: 1.3 — ABNORMAL HIGH (ref 0.8–1.2)
Prothrombin Time: 15.4 seconds — ABNORMAL HIGH (ref 11.4–15.2)

## 2020-07-24 LAB — CBC WITH DIFFERENTIAL/PLATELET
Abs Immature Granulocytes: 0.26 10*3/uL — ABNORMAL HIGH (ref 0.00–0.07)
Basophils Absolute: 0.1 10*3/uL (ref 0.0–0.1)
Basophils Relative: 1 %
Eosinophils Absolute: 0 10*3/uL (ref 0.0–0.5)
Eosinophils Relative: 0 %
HCT: 47.5 % — ABNORMAL HIGH (ref 36.0–46.0)
Hemoglobin: 15.9 g/dL — ABNORMAL HIGH (ref 12.0–15.0)
Immature Granulocytes: 2 %
Lymphocytes Relative: 8 %
Lymphs Abs: 1.1 10*3/uL (ref 0.7–4.0)
MCH: 27.7 pg (ref 26.0–34.0)
MCHC: 33.5 g/dL (ref 30.0–36.0)
MCV: 82.9 fL (ref 80.0–100.0)
Monocytes Absolute: 0.5 10*3/uL (ref 0.1–1.0)
Monocytes Relative: 3 %
Neutro Abs: 13.1 10*3/uL — ABNORMAL HIGH (ref 1.7–7.7)
Neutrophils Relative %: 86 %
Platelets: 436 10*3/uL — ABNORMAL HIGH (ref 150–400)
RBC: 5.73 MIL/uL — ABNORMAL HIGH (ref 3.87–5.11)
RDW: 14.6 % (ref 11.5–15.5)
Smear Review: NORMAL
WBC: 15 10*3/uL — ABNORMAL HIGH (ref 4.0–10.5)
nRBC: 0 % (ref 0.0–0.2)

## 2020-07-24 LAB — BRAIN NATRIURETIC PEPTIDE: B Natriuretic Peptide: 111 pg/mL — ABNORMAL HIGH (ref 0.0–100.0)

## 2020-07-24 LAB — URINALYSIS, COMPLETE (UACMP) WITH MICROSCOPIC
Bilirubin Urine: NEGATIVE
Glucose, UA: NEGATIVE mg/dL
Ketones, ur: NEGATIVE mg/dL
Nitrite: NEGATIVE
Protein, ur: 30 mg/dL — AB
Specific Gravity, Urine: 1.015 (ref 1.005–1.030)
WBC, UA: >50 WBC/hpf — ABNORMAL HIGH (ref 0–5)
pH: 5 (ref 5.0–8.0)

## 2020-07-24 LAB — TROPONIN I (HIGH SENSITIVITY): Troponin I (High Sensitivity): 10 ng/L (ref <18)

## 2020-07-24 LAB — PROCALCITONIN: Procalcitonin: 1.35 ng/mL

## 2020-07-24 LAB — HIV ANTIBODY (ROUTINE TESTING W REFLEX): HIV Screen 4th Generation wRfx: NONREACTIVE

## 2020-07-24 LAB — SARS CORONAVIRUS 2 BY RT PCR (HOSPITAL ORDER, PERFORMED IN ~~LOC~~ HOSPITAL LAB): SARS Coronavirus 2: POSITIVE — AB

## 2020-07-24 LAB — APTT: aPTT: <24 seconds — ABNORMAL LOW (ref 24–36)

## 2020-07-24 MED ORDER — SODIUM CHLORIDE 0.9 % IV BOLUS (SEPSIS)
1500.0000 mL | Freq: Once | INTRAVENOUS | Status: AC
  Administered 2020-07-24: 1500 mL via INTRAVENOUS

## 2020-07-24 MED ORDER — ADULT MULTIVITAMIN W/MINERALS CH
1.0000 | ORAL_TABLET | Freq: Every day | ORAL | Status: DC
  Administered 2020-07-24 – 2020-08-04 (×12): 1 via ORAL
  Filled 2020-07-24 (×12): qty 1

## 2020-07-24 MED ORDER — SODIUM CHLORIDE 0.9 % IV BOLUS (SEPSIS)
1000.0000 mL | Freq: Once | INTRAVENOUS | Status: AC
  Administered 2020-07-24: 1000 mL via INTRAVENOUS

## 2020-07-24 MED ORDER — SODIUM CHLORIDE 0.9 % IV SOLN
2.0000 g | INTRAVENOUS | Status: DC
  Administered 2020-07-24 – 2020-07-28 (×5): 2 g via INTRAVENOUS
  Filled 2020-07-24 (×2): qty 20
  Filled 2020-07-24 (×2): qty 2
  Filled 2020-07-24: qty 20

## 2020-07-24 MED ORDER — ALBUTEROL SULFATE HFA 108 (90 BASE) MCG/ACT IN AERS
2.0000 | INHALATION_SPRAY | Freq: Four times a day (QID) | RESPIRATORY_TRACT | Status: DC
  Administered 2020-07-24 – 2020-07-30 (×24): 2 via RESPIRATORY_TRACT
  Filled 2020-07-24: qty 6.7

## 2020-07-24 MED ORDER — METOPROLOL SUCCINATE ER 50 MG PO TB24
50.0000 mg | ORAL_TABLET | Freq: Every day | ORAL | Status: DC
  Administered 2020-07-24 – 2020-07-27 (×4): 50 mg via ORAL
  Filled 2020-07-24 (×4): qty 1

## 2020-07-24 MED ORDER — SODIUM CHLORIDE 0.9 % IV SOLN: 2.0000 g | INTRAVENOUS | Status: DC

## 2020-07-24 MED ORDER — ZINC SULFATE 220 (50 ZN) MG PO CAPS
220.0000 mg | ORAL_CAPSULE | Freq: Every day | ORAL | Status: DC
  Administered 2020-07-24 – 2020-08-04 (×12): 220 mg via ORAL
  Filled 2020-07-24 (×12): qty 1

## 2020-07-24 MED ORDER — HYDRALAZINE HCL 20 MG/ML IJ SOLN: 10.0000 mg | Freq: Four times a day (QID) | INTRAMUSCULAR | Status: DC | PRN

## 2020-07-24 MED ORDER — SODIUM CHLORIDE 0.9 % IV SOLN: INTRAVENOUS | Status: DC

## 2020-07-24 MED ORDER — DILTIAZEM HCL-DEXTROSE 125-5 MG/125ML-% IV SOLN (PREMIX)
5.0000 mg/h | INTRAVENOUS | Status: DC
  Filled 2020-07-24: qty 125

## 2020-07-24 MED ORDER — ASPIRIN EC 81 MG PO TBEC
81.0000 mg | DELAYED_RELEASE_TABLET | Freq: Every day | ORAL | Status: DC
  Administered 2020-07-24 – 2020-08-04 (×12): 81 mg via ORAL
  Filled 2020-07-24 (×12): qty 1

## 2020-07-24 MED ORDER — FLUCONAZOLE 100 MG PO TABS
100.0000 mg | ORAL_TABLET | Freq: Every day | ORAL | Status: DC
  Administered 2020-07-24 – 2020-07-25 (×2): 100 mg via ORAL
  Filled 2020-07-24 (×2): qty 1

## 2020-07-24 MED ORDER — POTASSIUM CHLORIDE CRYS ER 20 MEQ PO TBCR
40.0000 meq | EXTENDED_RELEASE_TABLET | Freq: Two times a day (BID) | ORAL | Status: AC
  Administered 2020-07-24 (×2): 40 meq via ORAL
  Filled 2020-07-24 (×2): qty 2

## 2020-07-24 MED ORDER — ONDANSETRON HCL 4 MG/2ML IJ SOLN
4.0000 mg | Freq: Four times a day (QID) | INTRAMUSCULAR | Status: DC | PRN
  Administered 2020-08-11 (×2): 4 mg via INTRAVENOUS
  Filled 2020-07-24 (×2): qty 2

## 2020-07-24 MED ORDER — ENOXAPARIN SODIUM 30 MG/0.3ML ~~LOC~~ SOLN
30.0000 mg | SUBCUTANEOUS | Status: DC
  Administered 2020-07-24: 30 mg via SUBCUTANEOUS
  Filled 2020-07-24: qty 0.3

## 2020-07-24 MED ORDER — GUAIFENESIN-DM 100-10 MG/5ML PO SYRP
10.0000 mL | ORAL_SOLUTION | ORAL | Status: DC | PRN
  Filled 2020-07-24: qty 10

## 2020-07-24 MED ORDER — SODIUM CHLORIDE 0.9 % IV SOLN
100.0000 mg | Freq: Every day | INTRAVENOUS | Status: AC
  Administered 2020-07-25 – 2020-07-28 (×4): 100 mg via INTRAVENOUS
  Filled 2020-07-24 (×3): qty 100
  Filled 2020-07-24: qty 20

## 2020-07-24 MED ORDER — METHYLPREDNISOLONE SODIUM SUCC 40 MG IJ SOLR
0.5000 mg/kg | Freq: Two times a day (BID) | INTRAMUSCULAR | Status: AC
  Administered 2020-07-24 – 2020-07-27 (×6): 39.6 mg via INTRAVENOUS
  Filled 2020-07-24 (×6): qty 1

## 2020-07-24 MED ORDER — GERHARDT'S BUTT CREAM
TOPICAL_CREAM | Freq: Three times a day (TID) | CUTANEOUS | Status: AC
  Administered 2020-08-08: 1 via TOPICAL
  Filled 2020-07-24 (×3): qty 1

## 2020-07-24 MED ORDER — SODIUM CHLORIDE 0.9 % IV SOLN
500.0000 mg | INTRAVENOUS | Status: DC
  Administered 2020-07-24 – 2020-07-28 (×5): 500 mg via INTRAVENOUS
  Filled 2020-07-24 (×5): qty 500

## 2020-07-24 MED ORDER — SODIUM CHLORIDE 0.9 % IV SOLN
200.0000 mg | Freq: Once | INTRAVENOUS | Status: AC
  Administered 2020-07-24: 200 mg via INTRAVENOUS
  Filled 2020-07-24: qty 200

## 2020-07-24 MED ORDER — ASCORBIC ACID 500 MG PO TABS
500.0000 mg | ORAL_TABLET | Freq: Every day | ORAL | Status: DC
  Administered 2020-07-24 – 2020-08-04 (×12): 500 mg via ORAL
  Filled 2020-07-24 (×12): qty 1

## 2020-07-24 MED ORDER — ONDANSETRON HCL 4 MG PO TABS: 4.0000 mg | ORAL_TABLET | Freq: Four times a day (QID) | ORAL | Status: DC | PRN

## 2020-07-24 MED ORDER — ACETAMINOPHEN 650 MG RE SUPP: 650.0000 mg | Freq: Four times a day (QID) | RECTAL | Status: DC | PRN

## 2020-07-24 MED ORDER — VITAMIN D 25 MCG (1000 UNIT) PO TABS
2000.0000 [IU] | ORAL_TABLET | Freq: Every day | ORAL | Status: DC
  Administered 2020-07-24 – 2020-08-04 (×12): 2000 [IU] via ORAL
  Filled 2020-07-24 (×13): qty 2

## 2020-07-24 MED ORDER — ACETAMINOPHEN 325 MG PO TABS
650.0000 mg | ORAL_TABLET | Freq: Four times a day (QID) | ORAL | Status: DC | PRN
  Administered 2020-07-25 – 2020-08-09 (×10): 650 mg via ORAL
  Filled 2020-07-24 (×10): qty 2

## 2020-07-24 MED ORDER — DEXAMETHASONE SODIUM PHOSPHATE 10 MG/ML IJ SOLN
6.0000 mg | INTRAMUSCULAR | Status: DC
  Administered 2020-07-24: 6 mg via INTRAVENOUS
  Filled 2020-07-24: qty 1

## 2020-07-24 MED ORDER — PREDNISONE 20 MG PO TABS
50.0000 mg | ORAL_TABLET | Freq: Every day | ORAL | Status: DC
  Administered 2020-07-27 – 2020-08-04 (×9): 50 mg via ORAL
  Filled 2020-07-24 (×8): qty 1

## 2020-07-24 MED ORDER — SODIUM CHLORIDE 0.9 % IV SOLN: 500.0000 mg | INTRAVENOUS | Status: DC

## 2020-07-24 NOTE — Progress Notes (Signed)
CODE SEPSIS - PHARMACY COMMUNICATION  **Broad Spectrum Antibiotics should be administered within 1 hour of Sepsis diagnosis**  Time Code Sepsis Called/Page Received:  3125  Antibiotics Ordered: ceftriaxone, azithromycin  Time of 1st antibiotic administration: 0958  Additional action taken by pharmacy:   If necessary, Name of Provider/Nurse Contacted:     Rocky Morel ,PharmD Clinical Pharmacist  07/24/2020  9:58 AM

## 2020-07-24 NOTE — ED Notes (Signed)
Report called by previous primary RN, pt transported to admission bed by ENA.

## 2020-07-24 NOTE — ED Notes (Signed)
Pt placed in clean gown and yellow fall risk socks. Pt placed in clean brief with chucks placed under her at this time.

## 2020-07-24 NOTE — Progress Notes (Signed)
Notified bedside nurse of need to draw repeat lactic acid. 

## 2020-07-24 NOTE — ED Notes (Signed)
Pt cleaned after bowel movement. External catheter, brief and linens changed at this time.   This RN and Sam RN visualized wounds that are oozing and weeping with discharge on inner thighs and under bilateral breasts. Cream applied at this time until pt screamed and cried for [place] to stop. Pt squeezed her thighs together and prevented this RN from cleaning any more.

## 2020-07-24 NOTE — ED Provider Notes (Signed)
[facility] Emergency Department Provider Note   ____________________________________________    I have reviewed the triage vital signs and the nursing notes.   HISTORY  Chief Complaint sepsis  Patient is confused   HPI Courtney Anderson is a 64 y.o. female with a history as noted below who presents with complaints of weakness, hypoxia, tachycardia.  Patient is apparently nonambulatory and is taken care of by her son.  She is unable to provide significant history.  She notes that she is just felt tired over the last 2 weeks.  Past Medical History:  Diagnosis Date  . Allergy   . Elevated transaminase level   . Hyperlipidemia   . Hypertension   . Obesity     Patient Active Problem List   Diagnosis Date Noted  . Sepsis ([place]) 07/24/2020  . AKI (acute kidney injury) ([place]) 07/24/2020  . COVID-19 virus infection 07/24/2020  . Atrial fibrillation with RVR ([place]) 07/24/2020  . Vitamin D deficiency 10/17/2019  . 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 Hypercalcemia 08/20/2018  . Hyperthyroidism 08/20/2018  . Multinodular goiter 08/20/2018  . Tachycardia 08/29/2017  . Hypertension 05/31/2015  . Morbid obesity ([place]) 05/31/2015  . Elevated transaminase level 05/31/2015  . Hyperlipidemia 05/31/2015    Past Surgical History:  Procedure Laterality Date  . BREAST SURGERY    . ECTOPIC PREGNANCY SURGERY      Prior to Admission medications   Medication Sig Start Date End Date Taking? Authorizing Provider  aspirin 81 MG tablet Take 81 mg by mouth daily.   Yes [provider]  Cholecalciferol (VITAMIN D) 50 MCG (2000 UT) tablet Take by mouth.   Yes [provider]  metoprolol succinate (TOPROL-XL) 50 MG 24 hr tablet Take 1 tablet (50 mg total) by mouth daily. 06/19/20  Yes Cannady, Henrine Screws T, NP  Multiple Vitamin (MULTIVITAMIN) tablet Take 1 tablet by mouth daily.   Yes [provider]  Olmesartan-amLODIPine-HCTZ 40-10-25  MG TABS Take 1 tablet by mouth daily. Need appointment for further refills. 06/16/20  Yes Marnee Guarneri T, NP     Allergies Codeine  Family History  Problem Relation Age of Onset  . Hypertension Father   . Stroke Father   . Hypertension Mother   . Arthritis Mother   . Aneurysm Sister   . Hypertension Sister   . Hypertension Brother   . Hypertension Sister   . Hypertension Brother     Social History Social History   Tobacco Use  . Smoking status: Former Research scientist (life sciences)  . Smokeless tobacco: Never Used  Substance Use Topics  . Alcohol use: Yes    Alcohol/week: 0.0 standard drinks    Comment: on occasion  . Drug use: No    Review of Systems limited by poor historian  Constitutional: No fevers reported fatigue as above Eyes: No visual changes.  ENT: Denies nasal congestion Cardiovascular: Denies chest pain. Respiratory: No cough reported Gastrointestinal: No abdominal pain Genitourinary: No reports of foul-smelling urine Musculoskeletal: No joint swelling Skin: Positive for Neurological: Denies headaches   ____________________________________________   PHYSICAL EXAM:  VITAL SIGNS: ED Triage Vitals  Enc Vitals Group     BP 07/24/20 0857 (!) 192/178     Pulse Rate 07/24/20 0857 (!) 157     Resp 07/24/20 0857 20     Temp 07/24/20 0857 98.6 F (37 C)     Temp Source 07/24/20 0857 Oral     SpO2 07/24/20 0857 (!) 86 %  Weight 07/24/20 0900 79.4 kg (175 lb)     Height 07/24/20 0900 1.651 m (5\' 5" )     Head Circumference --      Peak Flow --      Pain Score 07/24/20 0900 0     Pain Loc --      Pain Edu? --      Excl. in [place]? --     Constitutional: Alert but disoriented Eyes: Conjunctivae are normal.  Head: Atraumatic. Nose: No congestion/rhinnorhea. Mouth/Throat: Mucous membranes are dry Neck:  Painless ROM Cardiovascular: Tachycardia, regular rhythm,. Grossly normal heart sounds.  Good peripheral circulation. Respiratory: Mildly increased respiratory  effort, no retractions, bibasilar Rales Gastrointestinal: Soft and nontender. No distention.    Musculoskeletal:  Warm and well perfused Neurologic:  Normal speech and language. No gross focal neurologic deficits are appreciated.  Skin:  Skin is warm, dry.  Significant areas of ulceration underneath both breasts, underneath pannus in the groin bilaterally, foul-smelling with yellowish discharge with mild surrounding erythema highly suspicious for infected ulcerations.   Psychiatric: Mood and affect are normal.   ____________________________________________   LABS (all labs ordered are listed, but only abnormal results are displayed)  Labs Reviewed  SARS CORONAVIRUS 2 BY RT PCR ([facility]) - Abnormal; Notable for the following components:      Result Value   SARS Coronavirus 2 POSITIVE (*)    All other components within normal limits  LACTIC ACID, PLASMA - Abnormal; Notable for the following components:   Lactic Acid, Venous 3.9 (*)    All other components within normal limits  COMPREHENSIVE METABOLIC PANEL - Abnormal; Notable for the following components:   Sodium 132 (*)    Potassium 3.0 (*)    Chloride 94 (*)    CO2 20 (*)    Glucose, Bld 152 (*)    BUN 74 (*)    Creatinine, Ser 2.81 (*)    Calcium 10.4 (*)    Albumin 2.7 (*)    AST 60 (*)    ALT 54 (*)    GFR calc non Af Amer 17 (*)    GFR calc Af Amer 20 (*)    Anion gap 18 (*)    All other components within normal limits  CBC WITH DIFFERENTIAL/PLATELET - Abnormal; Notable for the following components:   WBC 15.0 (*)    RBC 5.73 (*)    Hemoglobin 15.9 (*)    HCT 47.5 (*)    Platelets 436 (*)    Neutro Abs 13.1 (*)    Abs Immature Granulocytes 0.26 (*)    All other components within normal limits  PROTIME-INR - Abnormal; Notable for the following components:   Prothrombin Time 15.4 (*)    INR 1.3 (*)    All other components within normal limits  APTT - Abnormal; Notable  for the following components:   aPTT <24 (*)    All other components within normal limits  BRAIN NATRIURETIC PEPTIDE - Abnormal; Notable for the following components:   B Natriuretic Peptide 111.0 (*)    All other components within normal limits  CULTURE, BLOOD (SINGLE)  URINE CULTURE  LACTIC ACID, PLASMA  URINALYSIS, COMPLETE (UACMP) WITH MICROSCOPIC  HIV ANTIBODY (ROUTINE TESTING W REFLEX)  THYROID PANEL WITH TSH  TROPONIN I (HIGH SENSITIVITY)   ____________________________________________  EKG  ED ECG REPORT I, Lavonia Drafts, the attending physician, personally viewed and interpreted this ECG.  Date: 07/24/2020  Rhythm: Atrial fibrillation with rapid  ventricular response QRS Axis: normal Intervals: Abnormal ST/T Wave abnormalities: normal Narrative Interpretation: A. fib with RVR  ____________________________________________  RADIOLOGY  Chest x-ray viewed by me, bilateral infiltrates suspicious for pneumonia ____________________________________________   PROCEDURES  Procedure(s) performed: No  Procedures   Critical Care performed: yes  CRITICAL CARE Performed by: Lavonia Drafts   Total critical care time: 30 minutes  Critical care time was exclusive of separately billable procedures and treating other patients.  Critical care was necessary to treat or prevent imminent or life-threatening deterioration.  Critical care was time spent personally by me on the following activities: development of treatment plan with patient and/or surrogate as well as nursing, discussions with consultants, evaluation of patient's response to treatment, examination of patient, obtaining history from patient or surrogate, ordering and performing treatments and interventions, ordering and review of laboratory studies, ordering and review of radiographic studies, pulse oximetry and re-evaluation of patient's condition.  ____________________________________________   INITIAL  IMPRESSION / ASSESSMENT AND PLAN / ED COURSE  Pertinent labs & imaging results that were available during my care of the patient were reviewed by me and considered in my medical decision making (see chart for details).  Patient presents with hypoxia requiring oxygen, tachycardia also with notable ulcerations beneath the breasts and in the groin which appears to be most consistent with poor hygiene and care at home  Differential includes sepsis, A. fib with RVR (no apparent history of this), pneumonia, COVID-19  She is afebrile in the department.  Pending lactic acid, white blood cell count, chest x-ray, labs.  Does not meet sepsis criteria at this time however  Patient is white blood cell count is elevated as is her lactic, code sepsis activated after return of initial lactic acid, Rocephin and azithromycin given for suspected pneumonia.  Will give 30 mils per kilogram of fluids  Patient's heart rate is improving with fluids  Patient with acute kidney injury  Discussed with Dr. Francine Graven who will admit the patient  Notified of positive Covid result    ____________________________________________   FINAL CLINICAL IMPRESSION(S) / ED DIAGNOSES  Final diagnoses:  Acute hypoxemic respiratory failure due to COVID-19 [facility])  Acute renal failure, unspecified acute renal failure type ([place])  Atrial fibrillation with rapid ventricular response ([place])  Skin ulcer, unspecified ulcer stage ([place])  COVID-19        Note:  This document was prepared using Dragon voice recognition software and may include unintentional dictation errors.   Lavonia Drafts, MD 07/24/20 1122

## 2020-07-24 NOTE — ED Notes (Signed)
Attempted to give report again to floor. RN unavailable at this time.

## 2020-07-24 NOTE — Progress Notes (Signed)
Patient still in ED at this time. Oncoming nurse Nehemiah Settle notified this RN received report already but pt is still in ED.

## 2020-07-24 NOTE — H&P (Addendum)
History and Physical    Courtney Anderson MHW:[id number] DOB: 1956-09-21 DOA: 07/24/2020  PCP: Guadalupe Maple, MD   Patient coming from: Home  I have personally briefly reviewed patient's old medical records in [place]  Chief Complaint: " I do not feel well"  HPI: Courtney Anderson is a 64 y.o. female with medical history significant for morbid obesity, hypertension, hypothyroidism, dyslipidemia who was brought into the ER by EMS as a "CODE SEPSIS".  Patient states that she has not felt well in over a week and complains of a cough, congestion and generalized weakness.  She also had intermittent fever and chills.  She states that she is usually able to care for herself but over the last 1 week has been unable to because she has felt unwell. Per EMS patient was noted to be tachycardic in the field but they were unable to get an adequate pulse ox reading and so she was placed on a nonrebreather mask.  She received 500 cc of fluid in route to the hospital.  Upon arrival to the ER she had room air pulse oximetry of 69% and so she remained on the nonrebreather mask with improvement in her pulse oximetry to the low 90s. She denies having any nausea, no vomiting, no changes in her bowel habits, no abdominal pain, no chest pain, no dizziness or lightheadedness.  She denies having any falls. Patient is also noted to have multiple areas of redness and excoriation under both breasts, abdominal creases and in between her upper thighs.  Redness noted to buttocks but no skin break at this time. Labs on admission show sodium 132, potassium 3.0, chloride 94, bicarb 20, BUN 74, creatinine 2.81, calcium 10.4, AST 60, ALT 54, total protein 8.1, BNP 111, lactic acid 3.9, white count 15 with a left shift, hemoglobin 15.9, hematocrit 47.5, MCV 82.9, RDW 14.6, platelet count 436. Patient's COVID-19 PCR test is positive Chest x-ray reviewed by me shows air bronchograms in the right lung base. Twelve-lead EKG shows  rapid A. fib with multiple PVCs and incomplete right bundle branch block and left anterior fascicular block    ED Course: Patient is a 64 year old female who was brought into the emergency room by EMS as code sepsis.  Patient was noted to have multiple, raw, excoriated and reddened areas on her skin mostly under her skin folds.  In the field she was said to be hypoxic and required a nonrebreather mask, in the ER she was found to have room air pulse oximetry of 69% and remains on a nonrebreather mask which has improved her pulse oximetry to the low 90s.  She was noted to be in rapid atrial fibrillation upon arrival.  She has acute kidney injury 0.97 >> 2.81, lactic acid is elevated at 3.9 and she has leukocytosis with a left shift.  Her calcium level is elevated at 11.4 when corrected for her low albumin.  Chest x-ray showed air bronchograms in the right lower lobe and her COVID-19 PCR is positive.  She received IV fluid resuscitation in the ER and will be admitted to the hospital for further evaluation.   Review of Systems: As per HPI otherwise 10 point review of systems negative.    Past Medical History:  Diagnosis Date  . Allergy   . Elevated transaminase level   . Hyperlipidemia   . Hypertension   . Obesity     Past Surgical History:  Procedure Laterality Date  . BREAST SURGERY    .  ECTOPIC PREGNANCY SURGERY       reports that she has quit smoking. She has never used smokeless tobacco. She reports current alcohol use. She reports that she does not use drugs.  Allergies  Allergen Reactions  . Codeine     Family History  Problem Relation Age of Onset  . Hypertension Father   . Stroke Father   . Hypertension Mother   . Arthritis Mother   . Aneurysm Sister   . Hypertension Sister   . Hypertension Brother   . Hypertension Sister   . Hypertension Brother      Prior to Admission medications   Medication Sig Start Date End Date Taking? Authorizing Provider  aspirin 81 MG  tablet Take 81 mg by mouth daily.   Yes [provider]  Cholecalciferol (VITAMIN D) 50 MCG (2000 UT) tablet Take by mouth.   Yes [provider]  metoprolol succinate (TOPROL-XL) 50 MG 24 hr tablet Take 1 tablet (50 mg total) by mouth daily. 06/19/20  Yes Cannady, Henrine Screws T, NP  Multiple Vitamin (MULTIVITAMIN) tablet Take 1 tablet by mouth daily.   Yes [provider]  Olmesartan-amLODIPine-HCTZ 40-10-25 MG TABS Take 1 tablet by mouth daily. Need appointment for further refills. 06/16/20  Yes Marnee Guarneri T, NP    Physical Exam: Vitals:   07/24/20 0930 07/24/20 1000 07/24/20 1030 07/24/20 1145  BP: 104/87 (!) 92/50 (!) 138/122 (!) 260/228  Pulse: (!) 58 (!) 25 63 (!) 56  Resp: (!) 35 (!) 33 (!) 33 (!) 24  Temp:      TempSrc:      SpO2: 98% 97% 97% 96%  Weight:      Height:         Vitals:   07/24/20 0930 07/24/20 1000 07/24/20 1030 07/24/20 1145  BP: 104/87 (!) 92/50 (!) 138/122 (!) 260/228  Pulse: (!) 58 (!) 25 63 (!) 56  Resp: (!) 35 (!) 33 (!) 33 (!) 24  Temp:      TempSrc:      SpO2: 98% 97% 97% 96%  Weight:      Height:        Constitutional: NAD, alert and oriented x 3.  Acutely ill-appearing please present Eyes: PERRL, lids and conjunctivae pallor ENMT: Mucous membranes are dry.  Neck: normal, supple, no masses, no thyromegaly Respiratory: Bilateral air entry, no wheezing, rhonchi at the right base. Normal respiratory effort. No accessory muscle use.  Cardiovascular: Irregularly irregular, tachycardic,no murmurs / rubs / gallops. No extremity edema. 2+ pedal pulses. No carotid bruits.  Abdomen: no tenderness, no masses palpated. No hepatosplenomegaly. Bowel sounds positive.  Central adiposity Musculoskeletal: no clubbing / cyanosis. No joint deformity upper and lower extremities.  Skin: Red, raw, excoriated areas under the skin folds (Breast, groin and abdomen) POA, redness over the buttock with no skin break Neurologic: No gross focal  neurologic deficit.  Generalized weakness Psychiatric: Normal mood and affect.   Labs on Admission: I have personally reviewed following labs and imaging studies  CBC: Recent Labs  Lab 07/24/20 0914  WBC 15.0*  NEUTROABS 13.1*  HGB 15.9*  HCT 47.5*  MCV 82.9  PLT 732*   Basic Metabolic Panel: Recent Labs  Lab 07/24/20 0914  NA 132*  K 3.0*  CL 94*  CO2 20*  GLUCOSE 152*  BUN 74*  CREATININE 2.81*  CALCIUM 10.4*   GFR: Estimated Creatinine Clearance: 20.8 mL/min (A) (by C-G formula based on SCr of 2.81 mg/dL (H)). Liver Function Tests: Recent  Labs  Lab 07/24/20 0914  AST 60*  ALT 54*  ALKPHOS 70  BILITOT 1.1  PROT 8.1  ALBUMIN 2.7*   No results for input(s): LIPASE, AMYLASE in the last 168 hours. No results for input(s): AMMONIA in the last 168 hours. Coagulation Profile: Recent Labs  Lab 07/24/20 0914  INR 1.3*   Cardiac Enzymes: No results for input(s): CKTOTAL, CKMB, CKMBINDEX, TROPONINI in the last 168 hours. BNP (last 3 results) No results for input(s): PROBNP in the last 8760 hours. HbA1C: No results for input(s): HGBA1C in the last 72 hours. CBG: No results for input(s): GLUCAP in the last 168 hours. Lipid Profile: No results for input(s): CHOL, HDL, LDLCALC, TRIG, CHOLHDL, LDLDIRECT in the last 72 hours. Thyroid Function Tests: No results for input(s): TSH, T4TOTAL, FREET4, T3FREE, THYROIDAB in the last 72 hours. Anemia Panel: No results for input(s): VITAMINB12, FOLATE, FERRITIN, TIBC, IRON, RETICCTPCT in the last 72 hours. Urine analysis:    Component Value Date/Time   APPEARANCEUR Hazy (A) 05/13/2018 1048   GLUCOSEU Negative 05/13/2018 1048   BILIRUBINUR Negative 05/13/2018 1048   PROTEINUR Negative 05/13/2018 1048   NITRITE Negative 05/13/2018 1048   LEUKOCYTESUR Trace (A) 05/13/2018 1048    Radiological Exams on Admission: DG Chest Port 1 View  Result Date: 07/24/2020 CLINICAL DATA:  Sepsis evaluation. EXAM: PORTABLE CHEST 1  VIEW COMPARISON:  07/25/2010 FINDINGS: Opacities throughout the lateral aspect of the right hemithorax. Evidence for air bronchograms at the medial right lung base. Slightly prominent left lung interstitial markings. Heart size is within normal limits for technique. Trachea is midline. IMPRESSION: Densities at the right lung base and along the lateral aspect of the right hemithorax. Findings likely represent a combination of pleural and parenchymal disease in the right chest. Findings could be better characterized with a chest CT with IV contrast. Electronically Signed   By: Markus Daft M.D.   On: 07/24/2020 09:32    EKG: Independently reviewed.  Atrial fibrillation with rapid ventricular rate Multiple PVCs Incomplete right bundle branch block and left anterior fascicular block   Assessment/Plan Principal Problem:   Sepsis ([place]) Active Problems:   Hypertension   Morbid obesity ([place])   Hypercalcemia   Hyperthyroidism   AKI (acute kidney injury) ([place])   COVID-19 virus infection   Atrial fibrillation with RVR ([facility])   Hypokalemia     Sepsis ([facility]) with complications of acute kidney injury and acute respiratory failure As evidenced by tachycardia, tachypnea, white cell count of 15,000 with a left shift, elevated lactic acid at 3.9 and chest x-ray findings suggestive of pneumonia Aggressive IV fluid resuscitation Empiric antibiotic therapy with Rocephin and Zithromax Follow-up results of blood and urine culture    Acute kidney injury Unclear etiology may be secondary to ATN Hold angiotensin receptor blocking agents and diuretic therapy Aggressive IV fluid hydration Repeat renal parameters in a.m.    Acute respiratory failure Patient was noted to have room air pulse oximetry in the 60s and was tachypneic. She is currently on a nonrebreather mask and maintaining pulse oximetry greater than 92% Acute respiratory failure appears to be secondary to COVID-19 pneumonia We will attempt to  wean off oxygen as tolerated   Atrial fibrillation with rapid ventricular rate Patient appears to have new onset A. fib triggered by sepsis She has a CHA2DS2-VASc score of 3 and ideally will require long-term anticoagulation as primary prophylaxis for an acute stroke We will defer initiation of anticoagulation to cardiology Continue metoprolol for rate control as  much as blood pressure tolerates Obtain TSH levels as patient has a known history of hyperthyroidism   Hypokalemia Supplement potassium   Hypercalcemia  Patient has a history of hypercalcemia in the past and has a known thyroid nodule Corrected calcium is 11.4g/dl Obtain PTH level Hydrate patient and repeat calcium levels   Morbid obesity Complicates overall prognosis and care    COVID-19 pneumonia Patient noted to have right lower lobe infiltrate and her COVID-19 PCR test is positive She is unvaccinated She was also hypoxic upon arrival and is currently on a nonrebreather mask Will place patient on remdesivir per protocol Place patient on IV Solu-Medrol Concern for superimposed bacterial infection especially since patient has leukocytosis.  Continue empiric antibiotic therapy with Rocephin and Zithromax Obtain procalcitonin levels    Possible skin tears Present on admission and mostly under her skin folds (breast, abdomen and groin) We will request wound care consult Discussed with wound care nurse who recommends at least a 5-day course of Diflucan to cover for fungal etiology     DVT prophylaxis: Lovenox Code Status: Full code Family Communication: Greater than 50% of time was spent discussing plan of care with patient at the bedside.  She verbalizes understanding and agrees with the plan.  CODE STATUS was discussed and she is a full code. Disposition Plan: Back to previous home environment Consults called: Cardiology    Lakedra Washington MD [facility]     07/24/2020, 12:50 PM

## 2020-07-24 NOTE — ED Notes (Signed)
Attempted to call floor for pt room assignment. Per Network engineer, room is not clean at this time but an order to clean has been placed. Will inform Advertising account planner at this time.

## 2020-07-24 NOTE — ED Triage Notes (Addendum)
Pt arrived to ED via [facility]. EMS stated pt complained of "not feeling well" for multiple days. EMS had encoded as sepsis alert due to multiple sites of wounds and infection. Pt was tachy and able to get good O2 reading. EMS placed pt on NRB. Still unable to get a reading, but pt appears to be breathing better. EMS stated pt given 500 mL NS en route.   Pt is A&Ox4. Pt states she fell a few days ago, bruising to the right upper leg visualized.   Pt has multiple open wounds throughout body.

## 2020-07-24 NOTE — Consult Note (Signed)
CARDIOLOGY CONSULT NOTE               Patient ID: Courtney Anderson MRN: [id number] DOB/AGE: 65-Oct-1957 57 y.o.  Admit date: 07/24/2020 Referring Physician Dr. Francine Graven hospitalist Primary Physician [facility] primary Primary Cardiologist [facility] Reason for Consultation atrial fibrillation rapid ventricular response shortness of breath respiratory failure  HPI: Patient is a 64 year old female significant obesity hypertension presented with respiratory failure hypoxemia and sepsis patient has significant skin irritation possible cellulitis she is also found to be Covid positive with hypoxemia she was hypotensive and tachycardic she was treated with fluid resuscitation and supplemental oxygen and inhalers patient been sick for the last few days did not get vaccinated denies nausea vomiting or diarrhea but has had weak feeling cough congestion and generalized weakness she is not sure if she has had a temperature because of her persistent symptoms she was brought to the emergency room for evaluation cardiology consultation was recommended because of rapid atrial fibrillation which appears to be new  Review of systems complete and found to be negative unless listed above     Past Medical History:  Diagnosis Date  . Allergy   . Elevated transaminase level   . Hyperlipidemia   . Hypertension   . Obesity     Past Surgical History:  Procedure Laterality Date  . BREAST SURGERY    . ECTOPIC PREGNANCY SURGERY      (Not in a hospital admission)  Social History   Socioeconomic History  . Marital status: Single    Spouse name: Not on file  . Number of children: Not on file  . Years of education: Not on file  . Highest education level: Not on file  Occupational History  . Not on file  Tobacco Use  . Smoking status: Former Research scientist (life sciences)  . Smokeless tobacco: Never Used  Substance and Sexual Activity  . Alcohol use: Yes    Alcohol/week: 0.0 standard drinks     Comment: on occasion  . Drug use: No  . Sexual activity: Never  Other Topics Concern  . Not on file  Social History Narrative  . Not on file   Social Determinants of Health   Financial Resource Strain:   . Difficulty of Paying Living Expenses: Not on file  Food Insecurity:   . Worried About Charity fundraiser in the Last Year: Not on file  . Ran Out of Food in the Last Year: Not on file  Transportation Needs:   . Lack of Transportation (Medical): Not on file  . Lack of Transportation (Non-Medical): Not on file  Physical Activity:   . Days of Exercise per Week: Not on file  . Minutes of Exercise per Session: Not on file  Stress:   . Feeling of Stress : Not on file  Social Connections:   . Frequency of Communication with Friends and Family: Not on file  . Frequency of Social Gatherings with Friends and Family: Not on file  . Attends Religious Services: Not on file  . Active Member of Clubs or Organizations: Not on file  . Attends Archivist Meetings: Not on file  . Marital Status: Not on file  Intimate Partner Violence:   . Fear of Current or Ex-Partner: Not on file  . Emotionally Abused: Not on file  . Physically Abused: Not on file  . Sexually Abused: Not on file    Family History  Problem Relation Age of Onset  . Hypertension Father   .  Stroke Father   . Hypertension Mother   . Arthritis Mother   . Aneurysm Sister   . Hypertension Sister   . Hypertension Brother   . Hypertension Sister   . Hypertension Brother       Review of systems complete and found to be negative unless listed above      PHYSICAL EXAM  General: Well developed, well nourished, in no acute distress HEENT:  Normocephalic and atramatic Neck:  No JVD.  Lungs: Clear bilaterally to auscultation and percussion. Heart: HRRR . Normal S1 and S2 without gallops or murmurs.  Abdomen: Bowel sounds are positive, abdomen soft and non-tender  Msk:  Back normal, normal gait. Normal  strength and tone for age. Extremities: No clubbing, cyanosis or edema.   Neuro: Alert and oriented X 3. Psych:  Good affect, responds appropriately  Labs:   Lab Results  Component Value Date   WBC 15.0 (H) 07/24/2020   HGB 15.9 (H) 07/24/2020   HCT 47.5 (H) 07/24/2020   MCV 82.9 07/24/2020   PLT 436 (H) 07/24/2020    Recent Labs  Lab 07/24/20 0914  NA 132*  K 3.0*  CL 94*  CO2 20*  BUN 74*  CREATININE 2.81*  CALCIUM 10.4*  PROT 8.1  BILITOT 1.1  ALKPHOS 70  ALT 54*  AST 60*  GLUCOSE 152*   No results found for: CKTOTAL, CKMB, CKMBINDEX, TROPONINI  Lab Results  Component Value Date   CHOL 175 10/22/2019   CHOL 130 05/13/2018   CHOL 190 12/19/2016   Lab Results  Component Value Date   HDL 50 10/22/2019   HDL 33 (L) 05/13/2018   HDL 32 (L) 12/19/2016   Lab Results  Component Value Date   LDLCALC 110 (H) 10/22/2019   LDLCALC 78 05/13/2018   LDLCALC 136 (H) 12/19/2016   Lab Results  Component Value Date   TRIG 81 10/22/2019   TRIG 96 05/13/2018   TRIG 108 12/19/2016   Lab Results  Component Value Date   CHOLHDL 3.9 05/13/2018   CHOLHDL 5.9 (H) 12/19/2016   No results found for: LDLDIRECT    Radiology: [facility] Chest Port 1 View  Result Date: 07/24/2020 CLINICAL DATA:  Sepsis evaluation. EXAM: PORTABLE CHEST 1 VIEW COMPARISON:  07/25/2010 FINDINGS: Opacities throughout the lateral aspect of the right hemithorax. Evidence for air bronchograms at the medial right lung base. Slightly prominent left lung interstitial markings. Heart size is within normal limits for technique. Trachea is midline. IMPRESSION: Densities at the right lung base and along the lateral aspect of the right hemithorax. Findings likely represent a combination of pleural and parenchymal disease in the right chest. Findings could be better characterized with a chest CT with IV contrast. Electronically Signed   By: Markus Daft M.D.   On: 07/24/2020 09:32    EKG: Rapid atrial fibrillation narrow  complex nonspecific findings rate of 135  ASSESSMENT AND PLAN:  Atrial fibrillation rapid ventricular response Sepsis Hypertension Acute renal insufficiency COVID-19 infection Morbid obesity Cellulitis Respiratory failure with hypoxemia Hypercalcemia Hypokalemic . Plan Agree with admit treat with respiratory support for respiratory failure Recommend steroid therapy Decadron Remdesivir should be considered for COVID-19 Broad-spectrum antibiotic therapy for possible cellulitis and sepsis Fluid resuscitation for dehydration renal insufficiency sepsis Long-term anticoagulation will be necessary but for now short-term anticoagulation with heparin Echocardiogram will be helpful for assessment of left ventricular function and valvular structures Follow-up thyroid studies including TSH Follow-up hypercalcemia Recommend significant weight loss because of morbid obesity  Follow-up inflammatory markers including troponin prolactin we will also consider getting a D-dimer Consider IV amiodarone to help with chemical cardioversion Rate control with Cardizem or metoprolol as needed  Signed: Yolonda Kida MD 07/24/2020, 4:41 PM

## 2020-07-24 NOTE — Consult Note (Signed)
[place] Nurse Consult Note: Reason for Consult: Consulted by admitting MD, Dr. Francine Graven for care of moisture related wounds in the inframammary and bilateral inguinal areas. I am assisting the patient remotely with skin care orders for this problem and have reviewed her medical record and communicated with the admitting MD for POC development.  The patient is presenting as Covid19+. Wound type:Moisture related skin damage, specifically intertriginous dermatitis (ITD) Pressure Injury POA: N/A Measurement: Per admitting MD, skin is open and red below breasts and in groin Wound bed: Red, moist Drainage (amount, consistency, odor) serous Periwound: intact Dressing procedure/placement/frequency: I have provided guidance for Nursing in the care of these wounds and for this problem, using silver hydrofiber (Aquacel Ag+ to the affected areas, but also using an antimicrobial wicking textile (InterDry) to donate antimicrobial properties and wick moisture away.  Additionally, I have recommended to the provider for her consideration a short course of systemic antifungal (eg., Diflucan, for 5-7 days) to assist with resolution of the patient's problem from a systemic perspective in addition to our interventions from a top-down perspective. A mattress replacement is requested if the patient is to transfer to a general nursing care floor; this is not needed if the patient is to go to the ICU as all beds on that unit have the low air loss feature that assists with moisture/microclimate mitigation. The patient is to be turned and repositioned per house protocol and I have added guidance for a pillow to be placed between her knees to promote airflow to the perineal and inguinal areas. A sacral prophylactic foam dressing and bilateral pressure redistribution heel boots are added for pressure injury prevention.  [place] nursing team will not follow, but will remain available to this patient, the nursing and medical teams.  Please  re-consult if needed. Thanks, Maudie Flakes, MSN, RN, [place], Arther Abbott  Pager# [phone]

## 2020-07-24 NOTE — ED Notes (Signed)
MD Wynona Neat informed of pt critical lactic and positive covid results

## 2020-07-24 NOTE — ED Notes (Signed)
Admit MD at bedside

## 2020-07-24 NOTE — ED Notes (Signed)
This RN and Sam RN visualized wounds to bilateral breasts, under breasts, under abdomen creases, few on the abdomen, and several between upper thighs and behind the thighs surrounding the vagina. Redness noted to buttocks but no open wound noted at this time.   All wounds are characterized as open, red, and with drainage and foul odor.

## 2020-07-25 ENCOUNTER — Inpatient Hospital Stay: Payer: Medicare Other

## 2020-07-25 ENCOUNTER — Inpatient Hospital Stay
Admit: 2020-07-25 | Discharge: 2020-07-25 | Disposition: A | Discharge status: Home / Self Care | Payer: Medicare Other | Attending: Internal Medicine | Admitting: Internal Medicine

## 2020-07-25 DIAGNOSIS — R6520 Severe sepsis without septic shock: Secondary | ICD-10-CM | POA: Diagnosis not present

## 2020-07-25 DIAGNOSIS — A419 Sepsis, unspecified organism: Secondary | ICD-10-CM | POA: Diagnosis not present

## 2020-07-25 DIAGNOSIS — N179 Acute kidney failure, unspecified: Secondary | ICD-10-CM | POA: Diagnosis not present

## 2020-07-25 LAB — COMPREHENSIVE METABOLIC PANEL
ALT: 45 U/L — ABNORMAL HIGH (ref 0–44)
AST: 48 U/L — ABNORMAL HIGH (ref 15–41)
Albumin: 2.3 g/dL — ABNORMAL LOW (ref 3.5–5.0)
Alkaline Phosphatase: 63 U/L (ref 38–126)
Anion gap: 9 (ref 5–15)
BUN: 98 mg/dL — ABNORMAL HIGH (ref 8–23)
CO2: 23 mmol/L (ref 22–32)
Calcium: 10.2 mg/dL (ref 8.9–10.3)
Chloride: 108 mmol/L (ref 98–111)
Creatinine, Ser: 1.36 mg/dL — ABNORMAL HIGH (ref 0.44–1.00)
GFR calc Af Amer: 47 mL/min — ABNORMAL LOW (ref >60)
GFR calc non Af Amer: 41 mL/min — ABNORMAL LOW (ref >60)
Glucose, Bld: 141 mg/dL — ABNORMAL HIGH (ref 70–99)
Potassium: 3.5 mmol/L (ref 3.5–5.1)
Sodium: 140 mmol/L (ref 135–145)
Total Bilirubin: 0.7 mg/dL (ref 0.3–1.2)
Total Protein: 7 g/dL (ref 6.5–8.1)

## 2020-07-25 LAB — CBC WITH DIFFERENTIAL/PLATELET
Abs Immature Granulocytes: 0.17 10*3/uL — ABNORMAL HIGH (ref 0.00–0.07)
Basophils Absolute: 0.1 10*3/uL (ref 0.0–0.1)
Basophils Relative: 0 %
Eosinophils Absolute: 0 10*3/uL (ref 0.0–0.5)
Eosinophils Relative: 0 %
HCT: 45.3 % (ref 36.0–46.0)
Hemoglobin: 15.4 g/dL — ABNORMAL HIGH (ref 12.0–15.0)
Immature Granulocytes: 1 %
Lymphocytes Relative: 7 %
Lymphs Abs: 0.8 10*3/uL (ref 0.7–4.0)
MCH: 27.8 pg (ref 26.0–34.0)
MCHC: 34 g/dL (ref 30.0–36.0)
MCV: 81.8 fL (ref 80.0–100.0)
Monocytes Absolute: 0.4 10*3/uL (ref 0.1–1.0)
Monocytes Relative: 3 %
Neutro Abs: 11.2 10*3/uL — ABNORMAL HIGH (ref 1.7–7.7)
Neutrophils Relative %: 89 %
Platelets: 405 10*3/uL — ABNORMAL HIGH (ref 150–400)
RBC: 5.54 MIL/uL — ABNORMAL HIGH (ref 3.87–5.11)
RDW: 14.6 % (ref 11.5–15.5)
WBC: 12.7 10*3/uL — ABNORMAL HIGH (ref 4.0–10.5)
nRBC: 0 % (ref 0.0–0.2)

## 2020-07-25 LAB — HEPARIN LEVEL (UNFRACTIONATED): Heparin Unfractionated: 0.11 IU/mL — ABNORMAL LOW (ref 0.30–0.70)

## 2020-07-25 LAB — URINE CULTURE: Culture: >=10000 — AB

## 2020-07-25 LAB — FERRITIN: Ferritin: 594 ng/mL — ABNORMAL HIGH (ref 11–307)

## 2020-07-25 LAB — THYROID PANEL WITH TSH
Free Thyroxine Index: 2.2 (ref 1.2–4.9)
T3 Uptake Ratio: 42 % — ABNORMAL HIGH (ref 24–39)
T4, Total: 5.3 ug/dL (ref 4.5–12.0)
TSH: 2.85 u[IU]/mL (ref 0.450–4.500)

## 2020-07-25 LAB — CORTISOL-AM, BLOOD: Cortisol - AM: 14 ug/dL (ref 6.7–22.6)

## 2020-07-25 LAB — MAGNESIUM: Magnesium: 2.4 mg/dL (ref 1.7–2.4)

## 2020-07-25 LAB — PHOSPHORUS: Phosphorus: 3.3 mg/dL (ref 2.5–4.6)

## 2020-07-25 LAB — LACTIC ACID, PLASMA: Lactic Acid, Venous: 2.6 mmol/L (ref 0.5–1.9)

## 2020-07-25 LAB — PROCALCITONIN: Procalcitonin: 1.39 ng/mL

## 2020-07-25 LAB — PROTIME-INR
INR: 1.4 — ABNORMAL HIGH (ref 0.8–1.2)
Prothrombin Time: 16.4 seconds — ABNORMAL HIGH (ref 11.4–15.2)

## 2020-07-25 LAB — C-REACTIVE PROTEIN: CRP: 20.4 mg/dL — ABNORMAL HIGH (ref <1.0)

## 2020-07-25 LAB — FIBRIN DERIVATIVES D-DIMER (ARMC ONLY)
Fibrin derivatives D-dimer (ARMC): 4002.2 ng/mL (FEU) — ABNORMAL HIGH (ref 0.00–499.00)
Fibrin derivatives D-dimer (ARMC): 3909.31 ng/mL (FEU) — ABNORMAL HIGH (ref 0.00–499.00)

## 2020-07-25 MED ORDER — HEPARIN (PORCINE) 25000 UT/250ML-% IV SOLN
1150.0000 [IU]/h | INTRAVENOUS | Status: DC
  Administered 2020-07-25: 1100 [IU]/h via INTRAVENOUS
  Administered 2020-07-26 – 2020-07-27 (×2): 1300 [IU]/h via INTRAVENOUS
  Administered 2020-07-29: 1000 [IU]/h via INTRAVENOUS
  Administered 2020-07-30: 1150 [IU]/h via INTRAVENOUS
  Filled 2020-07-25 (×7): qty 250

## 2020-07-25 MED ORDER — AMIODARONE LOAD VIA INFUSION
150.0000 mg | Freq: Once | INTRAVENOUS | Status: DC
  Filled 2020-07-25: qty 83.34

## 2020-07-25 MED ORDER — AMIODARONE HCL IN DEXTROSE 360-4.14 MG/200ML-% IV SOLN: 30.0000 mg/h | INTRAVENOUS | Status: DC

## 2020-07-25 MED ORDER — HYDROCODONE-ACETAMINOPHEN 5-325 MG PO TABS
1.0000 | ORAL_TABLET | Freq: Four times a day (QID) | ORAL | Status: DC | PRN
  Administered 2020-07-25 – 2020-08-08 (×2): 1 via ORAL
  Filled 2020-07-25 (×3): qty 1

## 2020-07-25 MED ORDER — PNEUMOCOCCAL VAC POLYVALENT 25 MCG/0.5ML IJ INJ: 0.5000 mL | INJECTION | INTRAMUSCULAR | Status: DC

## 2020-07-25 MED ORDER — HEPARIN BOLUS VIA INFUSION
2500.0000 [IU] | Freq: Once | INTRAVENOUS | Status: AC
  Administered 2020-07-25: 2500 [IU] via INTRAVENOUS
  Filled 2020-07-25: qty 2500

## 2020-07-25 MED ORDER — FENTANYL CITRATE (PF) 100 MCG/2ML IJ SOLN
25.0000 ug | Freq: Once | INTRAMUSCULAR | Status: AC
  Administered 2020-07-25: 25 ug via INTRAVENOUS
  Filled 2020-07-25: qty 2

## 2020-07-25 MED ORDER — HEPARIN BOLUS VIA INFUSION
3500.0000 [IU] | Freq: Once | INTRAVENOUS | Status: AC
  Administered 2020-07-25: 3500 [IU] via INTRAVENOUS
  Filled 2020-07-25: qty 3500

## 2020-07-25 MED ORDER — AMIODARONE HCL IN DEXTROSE 360-4.14 MG/200ML-% IV SOLN: 60.0000 mg/h | INTRAVENOUS | Status: DC

## 2020-07-25 MED ORDER — BARICITINIB 2 MG PO TABS
2.0000 mg | ORAL_TABLET | Freq: Every day | ORAL | Status: DC
  Administered 2020-07-25 – 2020-07-26 (×2): 2 mg via ORAL
  Filled 2020-07-25 (×2): qty 1

## 2020-07-25 MED ORDER — NYSTATIN 100000 UNIT/GM EX POWD
Freq: Three times a day (TID) | CUTANEOUS | Status: DC
  Filled 2020-07-25 (×4): qty 15

## 2020-07-25 MED ORDER — DIGOXIN 0.25 MG/ML IJ SOLN
0.5000 mg | Freq: Once | INTRAMUSCULAR | Status: AC
  Administered 2020-07-25: 0.5 mg via INTRAVENOUS
  Filled 2020-07-25: qty 2

## 2020-07-25 NOTE — Progress Notes (Signed)
[facility] Cardiology    SUBJECTIVE: Patient states she feels somewhat better less short of breath less achy not as sick sitting up in bed still on oxygen but much improved since yesterday heart rate somewhat better blood pressure still on the low side at times   Vitals:   07/25/20 0500 07/25/20 0552 07/25/20 0554 07/25/20 0600  BP: (!) 75/51 92/67 97/70  98/81  Pulse: (!) 58 85 79 (!) 54  Resp: 19 20 (!) 24 20  Temp:      TempSrc:      SpO2: (!) 89% 91% 94% 94%  Weight:      Height:         Intake/Output Summary (Last 24 hours) at 07/25/2020 6789 Last data filed at 07/25/2020 0600 Gross per 24 hour  Intake 1976.42 ml  Output 700 ml  Net 1276.42 ml      PHYSICAL EXAM  General: Well developed, well nourished, in no acute distress HEENT:  Normocephalic and atramatic Neck:  No JVD.  Lungs: Clear bilaterally to auscultation and percussion. Heart: HRRR . Normal S1 and S2 without gallops or murmurs.  Abdomen: Bowel sounds are positive, abdomen soft and non-tender  Msk:  Back normal, normal gait. Normal strength and tone for age. Extremities: No clubbing, cyanosis or edema.   Neuro: Alert and oriented X 3. Psych:  Good affect, responds appropriately   LABS: Basic Metabolic Panel: Recent Labs    07/24/20 0914  NA 132*  K 3.0*  CL 94*  CO2 20*  GLUCOSE 152*  BUN 74*  CREATININE 2.81*  CALCIUM 10.4*   Liver Function Tests: Recent Labs    07/24/20 0914  AST 60*  ALT 54*  ALKPHOS 70  BILITOT 1.1  PROT 8.1  ALBUMIN 2.7*   No results for input(s): LIPASE, AMYLASE in the last 72 hours. CBC: Recent Labs    07/24/20 0914 07/25/20 0656  WBC 15.0* 12.7*  NEUTROABS 13.1* 11.2*  HGB 15.9* 15.4*  HCT 47.5* 45.3  MCV 82.9 81.8  PLT 436* 405*   Cardiac Enzymes: No results for input(s): CKTOTAL, CKMB, CKMBINDEX, TROPONINI in the last 72 hours. BNP: Invalid input(s): POCBNP D-Dimer: No results for input(s): DDIMER in the last 72 hours. Hemoglobin A1C: No results  for input(s): HGBA1C in the last 72 hours. Fasting Lipid Panel: No results for input(s): CHOL, HDL, LDLCALC, TRIG, CHOLHDL, LDLDIRECT in the last 72 hours. Thyroid Function Tests: Recent Labs    07/24/20 1145  TSH 2.850  T4TOTAL 5.3   Anemia Panel: No results for input(s): VITAMINB12, FOLATE, FERRITIN, TIBC, IRON, RETICCTPCT in the last 72 hours.  DG Chest Port 1 View  Result Date: 07/24/2020 CLINICAL DATA:  Sepsis evaluation. EXAM: PORTABLE CHEST 1 VIEW COMPARISON:  07/25/2010 FINDINGS: Opacities throughout the lateral aspect of the right hemithorax. Evidence for air bronchograms at the medial right lung base. Slightly prominent left lung interstitial markings. Heart size is within normal limits for technique. Trachea is midline. IMPRESSION: Densities at the right lung base and along the lateral aspect of the right hemithorax. Findings likely represent a combination of pleural and parenchymal disease in the right chest. Findings could be better characterized with a chest CT with IV contrast. Electronically Signed   By: Markus Daft M.D.   On: 07/24/2020 09:32     Echo pending  TELEMETRY: Atrial fibrillation rapid ventricular response narrow complex nonspecific findings  ASSESSMENT AND PLAN:  Principal Problem:   Sepsis ([place]) Active Problems:   Hypertension   Morbid obesity ([place])  Hypercalcemia   Hyperthyroidism   AKI (acute kidney injury) ([place])   COVID-19 virus infection   Atrial fibrillation with RVR (H[facility])   Hypokalemia Hypotension  Plan Agree with telemetry follow-up EKGs troponins oxygenation Continue supplemental respiratory support for hypoxemia pneumonia Agree with COVID-19 therapy for steroids remdesivir consider antibiotics Continue broad-spectrum antibiotic therapy for pneumonia Agree with fluid resuscitation for possible sepsis possible cellulitis Correct electrolytes hypokalemia hypercalcemia Recommend echocardiogram for assessment of left ventricular function  wall motion and valvular structures Rate control with metoprolol and/or diltiazem Consider amiodarone intravenously to help with antiarrhythmic treatment Continue anticoagulation currently on Lovenox if converts soon will not necessarily recommend long-term anticoagulation as I believe this is all related to her chronic illness  Recommend additional fluid resuscitation and volume because of possible sepsis   Yolonda Kida, MD 07/25/2020 8:12 AM

## 2020-07-25 NOTE — Progress Notes (Signed)
   07/25/20 0143  Assess: MEWS Score  ECG Heart Rate (!) 110  Resp (!) 21  SpO2 92 %  O2 Device HFNC  O2 Flow Rate (L/min) 13 L/min  Assess: MEWS Score  MEWS Temp 0  MEWS Systolic 1  MEWS Pulse 1  MEWS RR 1  MEWS LOC 0  MEWS Score 3  MEWS Score Color Yellow  Assess: if the MEWS score is Yellow or Red  Were vital signs taken at a resting state? Yes  Focused Assessment No change from prior assessment  Early Detection of Sepsis Score *See Row Information* Medium  MEWS guidelines implemented *See Row Information* No, previously yellow, continue vital signs every 4 hours  Treat  Pain Scale 0-10  Pain Score 8  Pain Type Acute pain  Pain Location Generalized (in areas affected w/wounds; pt states pain slightly less now)  Pain Orientation Right;Left;Anterior  Pain Descriptors / Indicators Burning;Sharp  Pain Frequency Intermittent  Pain Onset With Activity (wound care)  Pain Intervention(s) Medication (See eMAR) (PRN pain medication (norco) administered at this time. )

## 2020-07-25 NOTE — Progress Notes (Signed)
   07/25/20 2000  Assess: MEWS Score  Pulse Rate 75  ECG Heart Rate (!) 120  Resp (!) 25  SpO2 90 %  Assess: MEWS Score  MEWS Temp 0  MEWS Systolic 1  MEWS Pulse 2  MEWS RR 1  MEWS LOC 0  MEWS Score 4  MEWS Score Color Red  Assess: if the MEWS score is Yellow or Red  Were vital signs taken at a resting state? Yes  Focused Assessment No change from prior assessment (Pt regularly tachypneic, tachycardic, not new)  Early Detection of Sepsis Score *See Row Information* High  MEWS guidelines implemented *See Row Information* No, vital signs rechecked

## 2020-07-25 NOTE — Consult Note (Signed)
[facility]  CONSULT NOTE    Date: 07/25/2020                  Patient Name:  Courtney Anderson  MRN: [id number]  DOB: 08/26/1956  Age / Sex: 64 y.o., female         PCP: Guadalupe Maple, MD                 Service Requesting Consult: Dr. Clayborn Bigness                 Reason for Consult: Acute renal failure            History of Present Illness: Courtney Anderson admitted to [facility] for not feeling well with a cough and weakness. She endorses fevers and chills. Patient was found to be hypoxic and positive for COVID-19. Patient was placed on oxygen. Patient also found to have irregular heart rate and rhythm. Patient also may have a cellulitis of her buttocks.   Patient states she has been taking all her medications as prescribed. Patient endorses use of nonsteroidal anti-inflammatory agents.    Medications: Outpatient medications: Medications Prior to Admission  Medication Sig Dispense Refill Last Dose  . aspirin 81 MG tablet Take 81 mg by mouth daily.   07/23/2020 at Unknown time  . Cholecalciferol (VITAMIN D) 50 MCG (2000 UT) tablet Take by mouth.   07/23/2020 at Unknown time  . metoprolol succinate (TOPROL-XL) 50 MG 24 hr tablet Take 1 tablet (50 mg total) by mouth daily. 30 tablet 0 07/23/2020 at Unknown time  . Multiple Vitamin (MULTIVITAMIN) tablet Take 1 tablet by mouth daily.   07/23/2020 at Unknown time  . Olmesartan-amLODIPine-HCTZ 40-10-25 MG TABS Take 1 tablet by mouth daily. Need appointment for further refills. 30 tablet 0 07/23/2020 at Unknown time    Current medications: Current Facility-Administered Medications  Medication Dose Route Frequency Provider Last Rate Last Admin  . 0.9 %  sodium chloride infusion   Intravenous Continuous Agbata, Tochukwu, MD 125 mL/hr at 07/25/20 0059 New Bag at 07/25/20 0059  . acetaminophen (TYLENOL) tablet 650 mg  650 mg Oral Q6H PRN Agbata, Tochukwu, MD       Or  . acetaminophen (TYLENOL) suppository 650  mg  650 mg Rectal Q6H PRN Agbata, Tochukwu, MD      . albuterol (VENTOLIN HFA) 108 (90 Base) MCG/ACT inhaler 2 puff  2 puff Inhalation Q6H Agbata, Tochukwu, MD   2 puff at 07/25/20 0954  . amiodarone (NEXTERONE) 1.8 mg/mL load via infusion 150 mg  150 mg Intravenous Once Callwood, Dwayne D, MD       Followed by  . amiodarone (NEXTERONE PREMIX) 360-4.14 MG/200ML-% (1.8 mg/mL) IV infusion  60 mg/hr Intravenous Continuous Callwood, Dwayne D, MD       Followed by  . amiodarone (NEXTERONE PREMIX) 360-4.14 MG/200ML-% (1.8 mg/mL) IV infusion  30 mg/hr Intravenous Continuous Callwood, Dwayne D, MD      . ascorbic acid (VITAMIN C) tablet 500 mg  500 mg Oral Daily Agbata, Tochukwu, MD   500 mg at 07/25/20 0952  . aspirin EC tablet 81 mg  81 mg Oral Daily Agbata, Tochukwu, MD   81 mg at 07/25/20 0952  . azithromycin (ZITHROMAX) 500 mg in sodium chloride 0.9 % 250 mL IVPB  500 mg Intravenous Q24H Lavonia Drafts, MD 250 mL/hr at 07/25/20 1133 500 mg at 07/25/20 1133  . cefTRIAXone (ROCEPHIN) 2 g in sodium chloride  0.9 % 100 mL IVPB  2 g Intravenous Q24H Lavonia Drafts, MD 200 mL/hr at 07/25/20 1114 2 g at 07/25/20 1114  . cholecalciferol (VITAMIN D3) tablet 2,000 Units  2,000 Units Oral Daily Agbata, Tochukwu, MD   2,000 Units at 07/25/20 0952  . fluconazole (DIFLUCAN) tablet 100 mg  100 mg Oral Daily Agbata, Tochukwu, MD   100 mg at 07/25/20 0952  . Gerhardt's butt cream   Topical TID Collier Bullock, MD   Given at 07/25/20 0955  . guaiFENesin-dextromethorphan (ROBITUSSIN DM) 100-10 MG/5ML syrup 10 mL  10 mL Oral Q4H PRN Agbata, Tochukwu, MD      . heparin ADULT infusion 100 units/mL (25000 units/255mL sodium chloride 0.45%)  1,100 Units/hr Intravenous Continuous Benita Gutter, RPH 11 mL/hr at 07/25/20 1123 1,100 Units/hr at 07/25/20 1123  . hydrALAZINE (APRESOLINE) injection 10 mg  10 mg Intravenous Q6H PRN Agbata, Tochukwu, MD      . HYDROcodone-acetaminophen (NORCO/VICODIN) 5-325 MG per tablet 1 tablet   1 tablet Oral Q6H PRN Lang Snow, NP   1 tablet at 07/25/20 0143  . methylPREDNISolone sodium succinate (SOLU-MEDROL) 40 mg/mL injection 39.6 mg  0.5 mg/kg Intravenous Q12H Agbata, Tochukwu, MD   39.6 mg at 07/25/20 0603   Followed by  . [START ON 07/27/2020] predniSONE (DELTASONE) tablet 50 mg  50 mg Oral Daily Agbata, Tochukwu, MD      . metoprolol succinate (TOPROL-XL) 24 hr tablet 50 mg  50 mg Oral Daily Agbata, Tochukwu, MD   50 mg at 07/25/20 0952  . multivitamin with minerals tablet 1 tablet  1 tablet Oral Daily Agbata, Tochukwu, MD   1 tablet at 07/25/20 0952  . ondansetron (ZOFRAN) tablet 4 mg  4 mg Oral Q6H PRN Agbata, Tochukwu, MD       Or  . ondansetron (ZOFRAN) injection 4 mg  4 mg Intravenous Q6H PRN Agbata, Tochukwu, MD      . remdesivir 100 mg in sodium chloride 0.9 % 100 mL IVPB  100 mg Intravenous Daily Agbata, Tochukwu, MD 200 mL/hr at 07/25/20 0959 100 mg at 07/25/20 0959  . zinc sulfate capsule 220 mg  220 mg Oral Daily Agbata, Tochukwu, MD   220 mg at 07/25/20 8127      Allergies: Allergies  Allergen Reactions  . Codeine       Past Medical History: Past Medical History:  Diagnosis Date  . Allergy   . Elevated transaminase level   . Hyperlipidemia   . Hypertension   . Obesity      Past Surgical History: Past Surgical History:  Procedure Laterality Date  . BREAST SURGERY    . ECTOPIC PREGNANCY SURGERY       Family History: Family History  Problem Relation Age of Onset  . Hypertension Father   . Stroke Father   . Hypertension Mother   . Arthritis Mother   . Aneurysm Sister   . Hypertension Sister   . Hypertension Brother   . Hypertension Sister   . Hypertension Brother      Social History: Social History   Socioeconomic History  . Marital status: Single    Spouse name: Not on file  . Number of children: Not on file  . Years of education: Not on file  . Highest education level: Not on file  Occupational History  . Not on  file  Tobacco Use  . Smoking status: Former Research scientist (life sciences)  . Smokeless tobacco: Never Used  Substance and Sexual Activity  .  Alcohol use: Yes    Alcohol/week: 0.0 standard drinks    Comment: on occasion  . Drug use: No  . Sexual activity: Never  Other Topics Concern  . Not on file  Social History Narrative  . Not on file   Social Determinants of Health   Financial Resource Strain:   . Difficulty of Paying Living Expenses: Not on file  Food Insecurity:   . Worried About Charity fundraiser in the Last Year: Not on file  . Ran Out of Food in the Last Year: Not on file  Transportation Needs:   . Lack of Transportation (Medical): Not on file  . Lack of Transportation (Non-Medical): Not on file  Physical Activity:   . Days of Exercise per Week: Not on file  . Minutes of Exercise per Session: Not on file  Stress:   . Feeling of Stress : Not on file  Social Connections:   . Frequency of Communication with Friends and Family: Not on file  . Frequency of Social Gatherings with Friends and Family: Not on file  . Attends Religious Services: Not on file  . Active Member of Clubs or Organizations: Not on file  . Attends Archivist Meetings: Not on file  . Marital Status: Not on file  Intimate Partner Violence:   . Fear of Current or Ex-Partner: Not on file  . Emotionally Abused: Not on file  . Physically Abused: Not on file  . Sexually Abused: Not on file     Review of Systems: Review of Systems  Constitutional: Negative.   HENT: Negative.   Eyes: Negative.   Respiratory: Positive for cough, sputum production, shortness of breath and wheezing. Negative for hemoptysis.   Cardiovascular: Positive for palpitations. Negative for chest pain, orthopnea, claudication, leg swelling and PND.  Gastrointestinal: Negative.   Genitourinary: Negative.  Negative for dysuria, flank pain, frequency, hematuria and urgency.  Musculoskeletal: Negative.  Negative for back pain, falls, joint  pain, myalgias and neck pain.  Skin: Negative.   Neurological: Negative.   Endo/Heme/Allergies: Negative.   Psychiatric/Behavioral: Negative.     Vital Signs: Blood pressure (!) 85/70, pulse (!) 105, temperature 97.6 F (36.4 C), temperature source Axillary, resp. rate 18, height 5\' 5"  (1.651 m), weight 79.4 kg, SpO2 95 %.  Weight trends: Filed Weights   07/24/20 0900  Weight: 79.4 kg    Physical Exam: General: NAD, ill appearing  Head: Normocephalic, atraumatic. Moist oral mucosal membranes  Eyes: Anicteric, PERRL  Neck: Supple, trachea midline  Lungs:  Diminished bilaterally  HFNC 13L O2  Heart: irregular  Abdomen:  Soft, nontender,   Extremities:  no peripheral edema.  Neurologic: Nonfocal, moving all four extremities  Skin: No lesions        Lab results: Basic Metabolic Panel: Recent Labs  Lab 07/24/20 0914 07/25/20 0656  NA 132* 140  K 3.0* 3.5  CL 94* 108  CO2 20* 23  GLUCOSE 152* 141*  BUN 74* 98*  CREATININE 2.81* 1.36*  CALCIUM 10.4* 10.2  MG  --  2.4  PHOS  --  3.3    Liver Function Tests: Recent Labs  Lab 07/24/20 0914 07/25/20 0656  AST 60* 48*  ALT 54* 45*  ALKPHOS 70 63  BILITOT 1.1 0.7  PROT 8.1 7.0  ALBUMIN 2.7* 2.3*   No results for input(s): LIPASE, AMYLASE in the last 168 hours. No results for input(s): AMMONIA in the last 168 hours.  CBC: Recent Labs  Lab 07/24/20 0914  07/25/20 0656  WBC 15.0* 12.7*  NEUTROABS 13.1* 11.2*  HGB 15.9* 15.4*  HCT 47.5* 45.3  MCV 82.9 81.8  PLT 436* 405*    Cardiac Enzymes: No results for input(s): CKTOTAL, CKMB, CKMBINDEX, TROPONINI in the last 168 hours.  BNP: Invalid input(s): POCBNP  CBG: No results for input(s): GLUCAP in the last 168 hours.  Microbiology: Results for orders placed or performed during the hospital encounter of 07/24/20  Blood culture (routine single)     Status: None (Preliminary result)   Collection Time: 07/24/20  9:14 AM   Specimen: BLOOD LEFT ARM   Result Value Ref Range Status   Specimen Description BLOOD LEFT ARM  Final   Special Requests   Final    BOTTLES DRAWN AEROBIC AND ANAEROBIC Blood Culture adequate volume   Culture   Final    NO GROWTH < 24 HOURS Performed at [facility], [address] [place], [place] [address]    Report Status PENDING  Incomplete  SARS Coronavirus 2 by RT PCR (hospital order, performed in [facility] hospital lab) Nasopharyngeal Nasopharyngeal Swab     Status: Abnormal   Collection Time: 07/24/20  9:16 AM   Specimen: Nasopharyngeal Swab  Result Value Ref Range Status   SARS Coronavirus 2 POSITIVE (A) NEGATIVE Final    Comment: RESULT CALLED TO, READ BACK BY AND VERIFIED WITH:  SAMANTHA HAMILTON AT 1696 07/24/20 SDR (NOTE) SARS-CoV-2 target nucleic acids are DETECTED  SARS-CoV-2 RNA is generally detectable in upper respiratory specimens  during the acute phase of infection.  Positive results are indicative  of the presence of the identified virus, but do not rule out bacterial infection or co-infection with other pathogens not detected by the test.  Clinical correlation with patient history and  other diagnostic information is necessary to determine patient infection status.  The expected result is negative.  Fact Sheet for Patients:   StrictlyIdeas.no   Fact Sheet for Healthcare Providers:   BankingDealers.co.za    This test is not yet approved or cleared by the [place] FDA and  has been authorized for detection and/or diagnosis of SARS-CoV-2 by FDA under an Emergency Use Authorization (EUA).  This EUA will remain in effect (meaning thi s test can be used) for the duration of  the COVID-19 declaration under Section 564(b)(1) of the Act, 21 U.S.C. section 360-bbb-3(b)(1), unless the authorization is terminated or revoked sooner.  Performed at [facility], [address] [place], [place] [address]      Coagulation Studies: Recent Labs    07/24/20 0914 07/25/20 0656  LABPROT 15.4* 16.4*  INR 1.3* 1.4*    Urinalysis: Recent Labs    07/24/20 0912  COLORURINE YELLOW*  LABSPEC 1.015  PHURINE 5.0  GLUCOSEU NEGATIVE  HGBUR LARGE*  BILIRUBINUR NEGATIVE  KETONESUR NEGATIVE  PROTEINUR 30*  NITRITE NEGATIVE  LEUKOCYTESUR LARGE*      Imaging: DG Chest Port 1 View  Result Date: 07/24/2020 CLINICAL DATA:  Sepsis evaluation. EXAM: PORTABLE CHEST 1 VIEW COMPARISON:  07/25/2010 FINDINGS: Opacities throughout the lateral aspect of the right hemithorax. Evidence for air bronchograms at the medial right lung base. Slightly prominent left lung interstitial markings. Heart size is within normal limits for technique. Trachea is midline. IMPRESSION: Densities at the right lung base and along the lateral aspect of the right hemithorax. Findings likely represent a combination of pleural and parenchymal disease in the right chest. Findings could be better characterized with a chest CT with IV contrast. Electronically  Signed   By: Markus Daft M.D.   On: 07/24/2020 09:32      Assessment & Plan: Ms. JEREMY MCLAMB is a 64 y.o. black female with hypertension, hyperlipidemia, who was admitted to [facility] on 07/24/2020 for Atrial fibrillation with rapid ventricular response ([place]) [I48.91] Sepsis ([place]) [A41.9] Acute renal failure, unspecified acute renal failure type ([place]) [N17.9] Skin ulcer, unspecified ulcer stage ([place]) [L98.499] Acute hypoxemic respiratory failure due to COVID-19 ([place]) [U07.1, J96.01] COVID-19 [U07.1]  1. Acute renal failure: with baseline creatinine of 0.97 with normal GFR. History of bland urine.  Acute renal failure most likely secondary to concurrent illness with prerenal azotemia.  NSAID and hypercalcemia may have played a role.  No IV contrast exposure.  Creatinine improving.  - Check renal ultrasound - holding home olmesartan and hydrochlorothiazide - IV fluids: normal  saline at 142mL/hr  2. Hypotension: 85/70. Continues on metoprolol for rate control.  Pending echocardiogram  3. Atrial fibrillation:  - amiodarone - Appreciate cardiology input  4. Hypercalcemia: on over the counter vitamin D supplements. Holding hydrochlorothiazide. Followed by endocrinology as an outpatient.  - Check PTH, vitamin D level, SPEP/UPEP, PTH related peptide.    LOS: 1 Nikola Marone 9/21/202112:57 PM

## 2020-07-25 NOTE — Progress Notes (Signed)
Patient's continues to be hypotensive BP 93/60. MAP 67 HR 111. Dr. Clayborn Bigness notified, per Dr. Clayborn Bigness can d/c amio gtt, and give one time IV dig. Will administer and continue to monitor.

## 2020-07-25 NOTE — Progress Notes (Signed)
Pt wound care attempted after obtaining a OTO for IV pain medication; open areas under B/L breasts, B/L groin, under pannus, in umbilicus cleansed; pt did not tolerate well. Aquacell AG applied to B/L breasts, and under pannus but pt was screaming in pain and refused to allow staff to complete her wound care despite IV pain medication administered. B/L groin cleansed but Aquacell & Interdry not applied due to pt intolerance. Staff able to turn pt long enough to cleanse right buttock and apply prescribed butt cream, area to right lateral thigh OTA. Will inform oncoming staff of pt's inability to tolerate wound care & need to complete above noted dressings. Areas to be changed every 5 days as per WOCN recommendation. On call provider also notified of above.   Pt given PRN norco at this point as she endorsed significant pain and noted to be asleep an hour after administration.

## 2020-07-25 NOTE — Consult Note (Signed)
[facility] for Heparin Infusion Indication: atrial fibrillation  Allergies  Allergen Reactions  . Codeine     Patient Measurements: Height: 5\' 5"  (165.1 cm) Weight: 79.4 kg (175 lb) IBW/kg (Calculated) : 57 Heparin Dosing Weight: 73.7 kg  Vital Signs: Temp: 97.6 F (36.4 C) (09/21 0828) Temp Source: Axillary (09/21 0828) BP: 98/81 (09/21 0600) Pulse Rate: 54 (09/21 0600)  Labs: Recent Labs    07/24/20 0914 07/25/20 0656  HGB 15.9* 15.4*  HCT 47.5* 45.3  PLT 436* 405*  APTT <24*  --   LABPROT 15.4* 16.4*  INR 1.3* 1.4*  CREATININE 2.81* 1.36*  TROPONINIHS 10  --     Estimated Creatinine Clearance: 43 mL/min (A) (by C-G formula based on SCr of 1.36 mg/dL (H)).   Medical History: Past Medical History:  Diagnosis Date  . Allergy   . Elevated transaminase level   . Hyperlipidemia   . Hypertension   . Obesity     Medications:  No anticoagulation prior to admission per chart review  Assessment: Patient is a 64 y/o F with medical history including hypertension, hypothyroidism who is admitted with acute respiratory failure secondary to pneumonia. Admission complicated by atrial fibrillation with RVR. Pharmacy has been consulted to initiate heparin infusion for stroke prophylaxis in the setting of atrial fibrillation.   Baseline aPTT < 24, INR 1.4. H&H with mildly elevated H&H, platelets.   Goal of Therapy:  Heparin level 0.3-0.7 units/ml Monitor platelets by anticoagulation protocol: Yes   Plan:  --Heparin 3500 unit bolus x 1 followed by continuous infusion at 1100 units/hr --Heparin level 6 hours after initiation of infusion --Daily CBC per protocol  Benita Gutter 07/25/2020,9:10 AM

## 2020-07-25 NOTE — Progress Notes (Signed)
PROGRESS NOTE    Courtney Anderson  VZC:[id number] DOB: 12-04-1955 DOA: 07/24/2020 PCP: Guadalupe Maple, MD   Brief Narrative:  HPI: Courtney Anderson is a 64 y.o. female with medical history significant for morbid obesity, hypertension, hypothyroidism, dyslipidemia who was brought into the ER by EMS as a "CODE SEPSIS".  Patient states that she has not felt well in over a week and complains of a cough, congestion and generalized weakness.  She also had intermittent fever and chills.  She states that she is usually able to care for herself but over the last 1 week has been unable to because she has felt unwell. Per EMS patient was noted to be tachycardic in the field but they were unable to get an adequate pulse ox reading and so she was placed on a nonrebreather mask.  She received 500 cc of fluid in route to the hospital.  Upon arrival to the ER she had room air pulse oximetry of 69% and so she remained on the nonrebreather mask with improvement in her pulse oximetry to the low 90s. She denies having any nausea, no vomiting, no changes in her bowel habits, no abdominal pain, no chest pain, no dizziness or lightheadedness.  She denies having any falls. Patient is also noted to have multiple areas of redness and excoriation under both breasts, abdominal creases and in between her upper thighs.  Redness noted to buttocks but no skin break at this time. Labs on admission show sodium 132, potassium 3.0, chloride 94, bicarb 20, BUN 74, creatinine 2.81, calcium 10.4, AST 60, ALT 54, total protein 8.1, BNP 111, lactic acid 3.9, white count 15 with a left shift, hemoglobin 15.9, hematocrit 47.5, MCV 82.9, RDW 14.6, platelet count 436. Patient's COVID-19 PCR test is positive Chest x-ray reviewed by me shows air bronchograms in the right lung base. Twelve-lead EKG shows rapid A. fib with multiple PVCs and incomplete right bundle branch block and left anterior fascicular block    ED Course: Patient is a  64 year old female who was brought into the emergency room by EMS as code sepsis.  Patient was noted to have multiple, raw, excoriated and reddened areas on her skin mostly under her skin folds.  In the field she was said to be hypoxic and required a nonrebreather mask, in the ER she was found to have room air pulse oximetry of 69% and remains on a nonrebreather mask which has improved her pulse oximetry to the low 90s.  She was noted to be in rapid atrial fibrillation upon arrival.  She has acute kidney injury 0.97 >> 2.81, lactic acid is elevated at 3.9 and she has leukocytosis with a left shift.  Her calcium level is elevated at 11.4 when corrected for her low albumin.  Chest x-ray showed air bronchograms in the right lower lobe and her COVID-19 PCR is positive.  She received IV fluid resuscitation in the ER and will be admitted to the hospital for further evaluation.  Assessment & Plan:   Principal Problem:   Sepsis ([place]) Active Problems:   Hypertension   Morbid obesity ([place])   Hypercalcemia   Hyperthyroidism   AKI (acute kidney injury) ([place])   COVID-19 virus infection   Atrial fibrillation with RVR ([facility])   Hypokalemia  Acute hypoxic respiratory failure and severe sepsis secondary to COVID-19 pneumonia: Patient meets criteria for severe sepsis based on tachycardia, tachypnea, leukocytosis, acute kidney injury, hypoxia and lactic acid of 3.9.  Lactic acid has improved to 2.5  today.  Leukocytosis improved as well.  Patient remains afebrile.  Blood pressure fairly stable.  Currently on 30 L high flow nasal oxygen.  Patient feels well.  Able to speak in full sentences.  We will continue Solu-Medrol, remdesivir, zinc, vitamin C, antitussives, bronchodilators.  Patient was encouraged to prone, out of bed to chair, to use incentive spirometry and flutter valve. Actemra/Baricitinib  off label use - patient was told that if COVID-19 pneumonitis gets worse we might potentially use Actemra/ barititinib  off label, patient denies any known history of active diverticulitis, tuberculosis or hepatitis, understands the risks and benefits and wants to proceed with Actemra/barititinib treatment.  Will start that.  Will start on 2 mg based on her GFR which is 43 at this point in time.  Once her GFR is greater than 60, this can be increased to 4 mg. Elevated pro calcitonin, suspecting for possible bacterial infection.  Continue Rocephin and Zithromax.  Follow cultures.  Acute kidney injury: Likely due to ATN due to sepsis.  Creatinine upon presentation at 2.81 and improved to 1.36.  Nephrology on board.  Management per them.  New onset atrial fibrillation with rapid ventricular rate: Rates around 1 10-1 20.  Cardiology on board.  She has been started on amiodarone and IV heparin drip.  Echo is pending.  Cardiology managing.  Appreciate their help and defer to them.  Hypokalemia: Resolved but had borderline.  Will give her 1 more dose orally today.  Hypercalcemia  Patient has a history of hypercalcemia in the past and has a known thyroid nodule Now corrected.  Morbid obesity Complicates overall prognosis and care.  Weight loss counseling provided.  Possible skin tears Present on admission and mostly under her skin folds (breast, abdomen and groin).  Wound care consulted.  Eating hospitalist discussed with wound care nurse who recommends at least a 5-day course of Diflucan to cover for fungal etiology  DVT prophylaxis:    Code Status: Full Code  Family Communication:  None present at bedside.  Plan of care discussed with patient in length and he verbalized understanding and agreed with it.  Patient alert and oriented.  Status is: Inpatient  Remains inpatient appropriate because:Hemodynamically unstable   Dispo: The patient is from: Home              Anticipated d/c is to: Home              Anticipated d/c date is: 3 days              Patient currently is not medically stable to  d/c.        Estimated body mass index is 29.12 kg/m as calculated from the following:   Height as of this encounter: 5\' 5"  (1.651 m).   Weight as of this encounter: 79.4 kg.      Nutritional status:               Consultants:   Cardiology and nephrology  Procedures:   None  Antimicrobials:  Anti-infectives (From admission, onward)   Start     Dose/Rate Route Frequency Ordered Stop   07/25/20 1000  azithromycin (ZITHROMAX) 500 mg in sodium chloride 0.9 % 250 mL IVPB  Status:  Discontinued        500 mg 250 mL/hr over 60 Minutes Intravenous Every 24 hours 07/24/20 1108 07/24/20 1317   07/25/20 1000  remdesivir 100 mg in sodium chloride 0.9 % 100 mL IVPB       "  Followed by" Linked Group Details   100 mg 200 mL/hr over 30 Minutes Intravenous Daily 07/24/20 1112 07/29/20 0959   07/24/20 1300  fluconazole (DIFLUCAN) tablet 100 mg        100 mg Oral Daily 07/24/20 1251 07/29/20 0959   07/24/20 1200  remdesivir 200 mg in sodium chloride 0.9% 250 mL IVPB       "Followed by" Linked Group Details   200 mg 580 mL/hr over 30 Minutes Intravenous Once 07/24/20 1112 07/24/20 1424   07/24/20 1115  cefTRIAXone (ROCEPHIN) 2 g in sodium chloride 0.9 % 100 mL IVPB  Status:  Discontinued        2 g 200 mL/hr over 30 Minutes Intravenous Every 24 hours 07/24/20 1108 07/24/20 1113   07/24/20 1000  cefTRIAXone (ROCEPHIN) 2 g in sodium chloride 0.9 % 100 mL IVPB        2 g 200 mL/hr over 30 Minutes Intravenous Every 24 hours 07/24/20 0947     07/24/20 1000  azithromycin (ZITHROMAX) 500 mg in sodium chloride 0.9 % 250 mL IVPB        500 mg 250 mL/hr over 60 Minutes Intravenous Every 24 hours 07/24/20 0947           Subjective: Seen and examined.  She states that she feels much better.  Denied any shortness of breath.  She was alert and oriented.  Objective: Vitals:   07/25/20 1341 07/25/20 1342 07/25/20 1343 07/25/20 1344  BP:      Pulse: (!) 36 (!) 55 71 (!) 151  Resp:       Temp:      TempSrc:      SpO2: 97% (!) 87% (!) 84% (!) 84%  Weight:      Height:        Intake/Output Summary (Last 24 hours) at 07/25/2020 1421 Last data filed at 07/25/2020 1330 Gross per 24 hour  Intake 2549.57 ml  Output 1200 ml  Net 1349.57 ml   Filed Weights   07/24/20 0900  Weight: 79.4 kg    Examination:  General exam: Appears calm and comfortable  Respiratory system: Clear to auscultation. Respiratory effort normal. Cardiovascular system: S1 & S2 heard, RRR. No JVD, murmurs, rubs, gallops or clicks. No pedal edema. Gastrointestinal system: Abdomen is nondistended, soft and nontender. No organomegaly or masses felt. Normal bowel sounds heard. Central nervous system: Alert and oriented. No focal neurological deficits. Extremities: Symmetric 5 x 5 power. Skin: No rashes, lesions or ulcers Psychiatry: Judgement and insight appear normal. Mood & affect appropriate.    Data Reviewed: I have personally reviewed following labs and imaging studies  CBC: Recent Labs  Lab 07/24/20 0914 07/25/20 0656  WBC 15.0* 12.7*  NEUTROABS 13.1* 11.2*  HGB 15.9* 15.4*  HCT 47.5* 45.3  MCV 82.9 81.8  PLT 436* 384*   Basic Metabolic Panel: Recent Labs  Lab 07/24/20 0914 07/25/20 0656  NA 132* 140  K 3.0* 3.5  CL 94* 108  CO2 20* 23  GLUCOSE 152* 141*  BUN 74* 98*  CREATININE 2.81* 1.36*  CALCIUM 10.4* 10.2  MG  --  2.4  PHOS  --  3.3   GFR: Estimated Creatinine Clearance: 43 mL/min (A) (by C-G formula based on SCr of 1.36 mg/dL (H)). Liver Function Tests: Recent Labs  Lab 07/24/20 0914 07/25/20 0656  AST 60* 48*  ALT 54* 45*  ALKPHOS 70 63  BILITOT 1.1 0.7  PROT 8.1 7.0  ALBUMIN 2.7* 2.3*   No  results for input(s): LIPASE, AMYLASE in the last 168 hours. No results for input(s): AMMONIA in the last 168 hours. Coagulation Profile: Recent Labs  Lab 07/24/20 0914 07/25/20 0656  INR 1.3* 1.4*   Cardiac Enzymes: No results for input(s): CKTOTAL, CKMB,  CKMBINDEX, TROPONINI in the last 168 hours. BNP (last 3 results) No results for input(s): PROBNP in the last 8760 hours. HbA1C: No results for input(s): HGBA1C in the last 72 hours. CBG: No results for input(s): GLUCAP in the last 168 hours. Lipid Profile: No results for input(s): CHOL, HDL, LDLCALC, TRIG, CHOLHDL, LDLDIRECT in the last 72 hours. Thyroid Function Tests: Recent Labs    07/24/20 1145  TSH 2.850  T4TOTAL 5.3   Anemia Panel: Recent Labs    07/25/20 0656  FERRITIN 594*   Sepsis Labs: Recent Labs  Lab 07/24/20 0914 07/24/20 1145 07/24/20 1425 07/25/20 0656  PROCALCITON  --   --  1.35 1.39  LATICACIDVEN 3.9* 2.5*  --   --     Recent Results (from the past 240 hour(s))  Blood culture (routine single)     Status: None (Preliminary result)   Collection Time: 07/24/20  9:14 AM   Specimen: BLOOD LEFT ARM  Result Value Ref Range Status   Specimen Description BLOOD LEFT ARM  Final   Special Requests   Final    BOTTLES DRAWN AEROBIC AND ANAEROBIC Blood Culture adequate volume   Culture   Final    NO GROWTH < 24 HOURS Performed at [facility], [address], [place], [place] [address]    Report Status PENDING  Incomplete  SARS Coronavirus 2 by RT PCR (hospital order, performed in [facility] hospital lab) Nasopharyngeal Nasopharyngeal Swab     Status: Abnormal   Collection Time: 07/24/20  9:16 AM   Specimen: Nasopharyngeal Swab  Result Value Ref Range Status   SARS Coronavirus 2 POSITIVE (A) NEGATIVE Final    Comment: RESULT CALLED TO, READ BACK BY AND VERIFIED WITH:  SAMANTHA HAMILTON AT [id number] 07/24/20 SDR (NOTE) SARS-CoV-2 target nucleic acids are DETECTED  SARS-CoV-2 RNA is generally detectable in upper respiratory specimens  during the acute phase of infection.  Positive results are indicative  of the presence of the identified virus, but do not rule out bacterial infection or co-infection with other pathogens not detected by the test.   Clinical correlation with patient history and  other diagnostic information is necessary to determine patient infection status.  The expected result is negative.  Fact Sheet for Patients:   StrictlyIdeas.no   Fact Sheet for Healthcare Providers:   BankingDealers.co.za    This test is not yet approved or cleared by the [place] FDA and  has been authorized for detection and/or diagnosis of SARS-CoV-2 by FDA under an Emergency Use Authorization (EUA).  This EUA will remain in effect (meaning thi s test can be used) for the duration of  the COVID-19 declaration under Section 564(b)(1) of the Act, 21 U.S.C. section 360-bbb-3(b)(1), unless the authorization is terminated or revoked sooner.  Performed at [facility], [address], [place], [place] [address]       Radiology Studies: [facility] Chest [place] 1 View  Result Date: 07/24/2020 CLINICAL DATA:  Sepsis evaluation. EXAM: PORTABLE CHEST 1 VIEW COMPARISON:  07/25/2010 FINDINGS: Opacities throughout the lateral aspect of the right hemithorax. Evidence for air bronchograms at the medial right lung base. Slightly prominent left lung interstitial markings. Heart size is within normal limits for technique. Trachea is midline.  IMPRESSION: Densities at the right lung base and along the lateral aspect of the right hemithorax. Findings likely represent a combination of pleural and parenchymal disease in the right chest. Findings could be better characterized with a chest CT with IV contrast. Electronically Signed   By: Markus Daft M.D.   On: 07/24/2020 09:32    Scheduled Meds: . albuterol  2 puff Inhalation Q6H  . amiodarone  150 mg Intravenous Once  . vitamin C  500 mg Oral Daily  . aspirin EC  81 mg Oral Daily  . cholecalciferol  2,000 Units Oral Daily  . fluconazole  100 mg Oral Daily  . Gerhardt's butt cream   Topical TID  . methylPREDNISolone (SOLU-MEDROL) injection  0.5 mg/kg  Intravenous Q12H   Followed by  . [START ON 07/27/2020] predniSONE  50 mg Oral Daily  . metoprolol succinate  50 mg Oral Daily  . multivitamin with minerals  1 tablet Oral Daily  . zinc sulfate  220 mg Oral Daily   Continuous Infusions: . sodium chloride 125 mL/hr at 07/25/20 1000  . amiodarone     Followed by  . amiodarone    . azithromycin 500 mg (07/25/20 1133)  . cefTRIAXone (ROCEPHIN)  IV 2 g (07/25/20 1114)  . heparin 1,100 Units/hr (07/25/20 1123)  . remdesivir 100 mg in NS 100 mL 100 mg (07/25/20 0959)     LOS: 1 day   Time spent: 39 minutes   Darliss Cheney, MD [facility]  07/25/2020, 2:21 PM   To contact the attending provider between 7A-7P or the covering provider during after hours 7P-7A, please log into the web site [email or web address].

## 2020-07-25 NOTE — Progress Notes (Signed)
   07/24/20 2200  Assess: MEWS Score  BP 93/80  Pulse Rate (!) 36  ECG Heart Rate (!) 114  Resp (!) 21  SpO2 94 %  O2 Device HFNC  Assess: MEWS Score  MEWS Temp 0  MEWS Systolic 1  MEWS Pulse 2  MEWS RR 1  MEWS LOC 0  MEWS Score 4  MEWS Score Color Red  Assess: if the MEWS score is Yellow or Red  Were vital signs taken at a resting state? Yes  Focused Assessment No change from prior assessment (HR/RR remain elevated, pt denies sx at thias time)  Early Detection of Sepsis Score *See Row Information* High  MEWS guidelines implemented *See Row Information* No, previously red, continue vital signs every 4 hours

## 2020-07-25 NOTE — Progress Notes (Signed)
   07/25/20 0000  Assess: MEWS Score  BP 119/78  Pulse Rate (!) 59  ECG Heart Rate (!) 129  Resp 17  SpO2 (!) 88 %  O2 Device Room Air (Pt removed HFNC stating, "it's too much air blowing")  Assess: MEWS Score  MEWS Temp 0  MEWS Systolic 0  MEWS Pulse 2  MEWS RR 0  MEWS LOC 0  MEWS Score 2  MEWS Score Color Yellow  Assess: if the MEWS score is Yellow or Red  Were vital signs taken at a resting state? Yes  Focused Assessment No change from prior assessment  Early Detection of Sepsis Score *See Row Information* High  MEWS guidelines implemented *See Row Information* No, previously yellow, continue vital signs every 4 hours

## 2020-07-25 NOTE — Progress Notes (Signed)
   07/25/20 0024  Assess: MEWS Score  Pulse Rate (!) 50  ECG Heart Rate (!) 116  Resp 19  Level of Consciousness Alert  SpO2 (!) 77 %  O2 Device Room Air (pt removing O2 2/2 "too much air blowing in my nose". )  Patient Activity (if Appropriate) In bed  Assess: MEWS Score  MEWS Temp 0  MEWS Systolic 0  MEWS Pulse 2  MEWS RR 0  MEWS LOC 0  MEWS Score 2  MEWS Score Color Yellow  Assess: if the MEWS score is Yellow or Red  Were vital signs taken at a resting state? Yes  Focused Assessment No change from prior assessment  Early Detection of Sepsis Score *See Row Information* High  MEWS guidelines implemented *See Row Information* No, previously yellow, continue vital signs every 4 hours (on call provider notified of ongoing elevated HR, RR; new or)  Treat  MEWS Interventions Other (Comment) (on call provider notified of ongoing ekevated HR, RR )  Notify: Provider  Provider Name/Title [place]  Date Provider Notified 07/25/20  Time Provider Notified 0024  Notification Type Page  Notification Reason Other (Comment) (update, request orders)  Response See new orders  Date of Provider Response 07/25/20  Time of Provider Response 0030

## 2020-07-25 NOTE — Progress Notes (Signed)
   07/24/20 2300  Assess: MEWS Score  BP 111/77  Pulse Rate (!) 118  ECG Heart Rate (!) 118  Resp (!) 29  Assess: MEWS Score  MEWS Temp 0  MEWS Systolic 0  MEWS Pulse 2  MEWS RR 2  MEWS LOC 0  MEWS Score 4  MEWS Score Color Red  Assess: if the MEWS score is Yellow or Red  Were vital signs taken at a resting state? Yes  Focused Assessment No change from prior assessment (HR/RR remain elevated, pt denies sx at this time)  Early Detection of Sepsis Score *See Row Information* High  MEWS guidelines implemented *See Row Information* No, previously red, continue vital signs every 4 hours

## 2020-07-25 NOTE — Progress Notes (Signed)
   07/24/20 2100  Assess: MEWS Score  BP 100/85  Pulse Rate 62  ECG Heart Rate (!) 129  Resp (!) 22  SpO2 91 %  O2 Device HFNC  Heater temperature 59 F (15 C)  Assess: MEWS Score  MEWS Temp 0  MEWS Systolic 1  MEWS Pulse 2  MEWS RR 1  MEWS LOC 0  MEWS Score 4  MEWS Score Color Red  Assess: if the MEWS score is Yellow or Red  Were vital signs taken at a resting state? Yes  Focused Assessment No change from prior assessment  Early Detection of Sepsis Score *See Row Information* High  MEWS guidelines implemented *See Row Information* No, other (Comment) (implement red MEWS protocol. NAD noted, HR, RR remain elevat)

## 2020-07-25 NOTE — Progress Notes (Signed)
*  PRELIMINARY RESULTS* Echocardiogram 2D Echocardiogram has been performed.  Courtney Anderson 07/25/2020, 1:19 PM

## 2020-07-25 NOTE — Progress Notes (Signed)
   07/25/20 0100  Assess: MEWS Score  Temp (!) 97.4 F (36.3 C)  BP 100/74  Pulse Rate (!) 58  ECG Heart Rate (!) 115  Resp (!) 21  Level of Consciousness Alert  SpO2 91 %  O2 Device HFNC  O2 Flow Rate (L/min) 13 L/min  Assess: MEWS Score  MEWS Temp 0  MEWS Systolic 1  MEWS Pulse 2  MEWS RR 1  MEWS LOC 0  MEWS Score 4  MEWS Score Color Red  Assess: if the MEWS score is Yellow or Red  Were vital signs taken at a resting state? No (Pt moving around in bed)  Focused Assessment No change from prior assessment  Early Detection of Sepsis Score *See Row Information* Low  MEWS guidelines implemented *See Row Information* No, previously red, continue vital signs every 4 hours  Treat  Pain Scale 0-10  Pain Score 10

## 2020-07-25 NOTE — Consult Note (Signed)
[facility] for Heparin Infusion Indication: atrial fibrillation  Allergies  Allergen Reactions  . Codeine     Patient Measurements: Height: 5\' 5"  (165.1 cm) Weight: 79.4 kg (175 lb) IBW/kg (Calculated) : 57 Heparin Dosing Weight: 73.7 kg  Vital Signs: Temp: 97.8 F (36.6 C) (09/21 1444) Temp Source: Oral (09/21 1444) BP: 94/56 (09/21 1444) Pulse Rate: 113 (09/21 1444)  Labs: Recent Labs    07/24/20 0914 07/25/20 0656 07/25/20 1831  HGB 15.9* 15.4*  --   HCT 47.5* 45.3  --   PLT 436* 405*  --   APTT <24*  --   --   LABPROT 15.4* 16.4*  --   INR 1.3* 1.4*  --   HEPARINUNFRC  --   --  0.11*  CREATININE 2.81* 1.36*  --   TROPONINIHS 10  --   --     Estimated Creatinine Clearance: 43 mL/min (A) (by C-G formula based on SCr of 1.36 mg/dL (H)).   Medical History: Past Medical History:  Diagnosis Date  . Allergy   . Elevated transaminase level   . Hyperlipidemia   . Hypertension   . Obesity     Medications:  No anticoagulation prior to admission per chart review  Assessment: Patient is a 64 y/o F with medical history including hypertension, hypothyroidism who is admitted with acute respiratory failure secondary to pneumonia. Admission complicated by atrial fibrillation with RVR. Pharmacy has been consulted to initiate heparin infusion for stroke prophylaxis in the setting of atrial fibrillation.   Baseline aPTT < 24, INR 1.4. H&H with mildly elevated H&H, platelets.   9/11 1831 HL 0.11   Goal of Therapy:  Heparin level 0.3-0.7 units/ml Monitor platelets by anticoagulation protocol: Yes   Plan:  Heparin level is subtherapeutic. Will give 2500 unit bolus and increase heparin infusion to 1300 units/hr. Recheck heparin level in 6 hours. CBC with AM labs.    Oswald Hillock, PharmD, BCPS 07/25/2020,7:24 PM

## 2020-07-25 NOTE — Progress Notes (Signed)
   07/25/20 0134  Assess: MEWS Score  ECG Heart Rate (!) 111  Resp 20  SpO2 90 %  O2 Device HFNC  O2 Flow Rate (L/min) 13 L/min  Assess: MEWS Score  MEWS Temp 0  MEWS Systolic 1  MEWS Pulse 2  MEWS RR 0  MEWS LOC 0  MEWS Score 3  MEWS Score Color Yellow  Assess: if the MEWS score is Yellow or Red  Were vital signs taken at a resting state? Yes  Focused Assessment No change from prior assessment  Early Detection of Sepsis Score *See Row Information* Medium  MEWS guidelines implemented *See Row Information* No, previously yellow, continue vital signs every 4 hours  Treat  MEWS Interventions Other (Comment) (Pt refused to allow staff to complete wound care- too painfu)

## 2020-07-25 NOTE — Progress Notes (Signed)
   07/25/20 0133  Assess: MEWS Score  ECG Heart Rate (!) 115  Resp (!) 21  Assess: MEWS Score  MEWS Temp 0  MEWS Systolic 1  MEWS Pulse 2  MEWS RR 1  MEWS LOC 0  MEWS Score 4  MEWS Score Color Red  Assess: if the MEWS score is Yellow or Red  Were vital signs taken at a resting state? No (Pt having wounds cleansed & dressed, in significant pain)  Focused Assessment No change from prior assessment  Early Detection of Sepsis Score *See Row Information* Medium  MEWS guidelines implemented *See Row Information* No, previously red, continue vital signs every 4 hours  Treat  MEWS Interventions Other (Comment) (IV pain med for wound care, pt not tolerating well.)

## 2020-07-25 NOTE — Progress Notes (Signed)
10:00 a.m.-Orders to start Amio gtt, however  BP 90/77, MAP 77, Afib 100's-120's. Dr. Clayborn Bigness notified, per MD hold on starting Amio gtt until SBP 100. SPO 98% on HFNC.  10:48- BP 85/70, pulse 110. MAP 77. Per Dr. Clayborn Bigness continue to hold amio, give 1 L fluid bolus. Will administer and continue to monitor.

## 2020-07-25 NOTE — Progress Notes (Signed)
   07/25/20 0108  Assess: MEWS Score  Pulse Rate (!) 56  ECG Heart Rate (!) 109  Resp (!) 22  SpO2 98 %  Assess: MEWS Score  MEWS Temp 0  MEWS Systolic 1  MEWS Pulse 1  MEWS RR 1  MEWS LOC 0  MEWS Score 3  MEWS Score Color Yellow  Assess: if the MEWS score is Yellow or Red  Were vital signs taken at a resting state? No (wound care being done, admin. IV fentanyl for pain)  Focused Assessment No change from prior assessment  Early Detection of Sepsis Score *See Row Information* Medium  MEWS guidelines implemented *See Row Information* No, other (Comment)  Treat  MEWS Interventions Administered prn meds/treatments;Other (Comment) (IV pain med for wound care, pt not tolerating well.)  Pain Scale 0-10  Pain Score 10  Pain Type Acute pain  Pain Location Generalized (in all aeas affected with wounds; pt in significant pain)  Pain Orientation Right;Left;Anterior (in folds: under breasts, pannus, B/L groin, umbilicus, lower)  Pain Descriptors / Indicators Burning;Sharp  Pain Frequency Intermittent  Pain Onset With Activity (wound care)  Pain Intervention(s) Medication (See eMAR) (IV fentanyl administered for wound care. )

## 2020-07-26 ENCOUNTER — Other Ambulatory Visit: Payer: Self-pay | Admitting: Nurse Practitioner

## 2020-07-26 DIAGNOSIS — I10 Essential (primary) hypertension: Secondary | ICD-10-CM

## 2020-07-26 DIAGNOSIS — N179 Acute kidney failure, unspecified: Secondary | ICD-10-CM | POA: Diagnosis not present

## 2020-07-26 DIAGNOSIS — A419 Sepsis, unspecified organism: Secondary | ICD-10-CM | POA: Diagnosis not present

## 2020-07-26 DIAGNOSIS — I4891 Unspecified atrial fibrillation: Secondary | ICD-10-CM | POA: Diagnosis not present

## 2020-07-26 DIAGNOSIS — U071 COVID-19: Secondary | ICD-10-CM | POA: Diagnosis not present

## 2020-07-26 DIAGNOSIS — R000 Tachycardia, unspecified: Secondary | ICD-10-CM

## 2020-07-26 LAB — PROTEIN ELECTROPHORESIS, SERUM
A/G Ratio: 0.5 — ABNORMAL LOW (ref 0.7–1.7)
Albumin ELP: 2.1 g/dL — ABNORMAL LOW (ref 2.9–4.4)
Alpha-1-Globulin: 0.4 g/dL (ref 0.0–0.4)
Alpha-2-Globulin: 1 g/dL (ref 0.4–1.0)
Beta Globulin: 0.9 g/dL (ref 0.7–1.3)
Gamma Globulin: 1.6 g/dL (ref 0.4–1.8)
Globulin, Total: 3.9 g/dL (ref 2.2–3.9)
M-Spike, %: 0.7 g/dL — ABNORMAL HIGH
Total Protein ELP: 6 g/dL (ref 6.0–8.5)

## 2020-07-26 LAB — CBC WITH DIFFERENTIAL/PLATELET
Abs Immature Granulocytes: 0.37 10*3/uL — ABNORMAL HIGH (ref 0.00–0.07)
Basophils Absolute: 0.1 10*3/uL (ref 0.0–0.1)
Basophils Relative: 1 %
Eosinophils Absolute: 0 10*3/uL (ref 0.0–0.5)
Eosinophils Relative: 0 %
HCT: 43.3 % (ref 36.0–46.0)
Hemoglobin: 14.9 g/dL (ref 12.0–15.0)
Immature Granulocytes: 3 %
Lymphocytes Relative: 7 %
Lymphs Abs: 0.8 10*3/uL (ref 0.7–4.0)
MCH: 28.1 pg (ref 26.0–34.0)
MCHC: 34.4 g/dL (ref 30.0–36.0)
MCV: 81.5 fL (ref 80.0–100.0)
Monocytes Absolute: 0.6 10*3/uL (ref 0.1–1.0)
Monocytes Relative: 5 %
Neutro Abs: 10.9 10*3/uL — ABNORMAL HIGH (ref 1.7–7.7)
Neutrophils Relative %: 84 %
Platelets: 418 10*3/uL — ABNORMAL HIGH (ref 150–400)
RBC: 5.31 MIL/uL — ABNORMAL HIGH (ref 3.87–5.11)
RDW: 14.5 % (ref 11.5–15.5)
WBC: 12.7 10*3/uL — ABNORMAL HIGH (ref 4.0–10.5)
nRBC: 0 % (ref 0.0–0.2)

## 2020-07-26 LAB — ECHOCARDIOGRAM COMPLETE
Height: 65 in
S' Lateral: 3.47 cm
Weight: 2800 oz

## 2020-07-26 LAB — COMPREHENSIVE METABOLIC PANEL
ALT: 39 U/L (ref 0–44)
AST: 35 U/L (ref 15–41)
Albumin: 2.4 g/dL — ABNORMAL LOW (ref 3.5–5.0)
Alkaline Phosphatase: 60 U/L (ref 38–126)
Anion gap: 8 (ref 5–15)
BUN: 59 mg/dL — ABNORMAL HIGH (ref 8–23)
CO2: 24 mmol/L (ref 22–32)
Calcium: 10.1 mg/dL (ref 8.9–10.3)
Chloride: 109 mmol/L (ref 98–111)
Creatinine, Ser: 0.96 mg/dL (ref 0.44–1.00)
GFR calc Af Amer: >60 mL/min (ref >60)
GFR calc non Af Amer: >60 mL/min (ref >60)
Glucose, Bld: 163 mg/dL — ABNORMAL HIGH (ref 70–99)
Potassium: 3 mmol/L — ABNORMAL LOW (ref 3.5–5.1)
Sodium: 141 mmol/L (ref 135–145)
Total Bilirubin: 0.6 mg/dL (ref 0.3–1.2)
Total Protein: 6.7 g/dL (ref 6.5–8.1)

## 2020-07-26 LAB — HEPARIN LEVEL (UNFRACTIONATED)
Heparin Unfractionated: 0.47 IU/mL (ref 0.30–0.70)
Heparin Unfractionated: 0.64 IU/mL (ref 0.30–0.70)

## 2020-07-26 LAB — PHOSPHORUS: Phosphorus: 1.8 mg/dL — ABNORMAL LOW (ref 2.5–4.6)

## 2020-07-26 LAB — KAPPA/LAMBDA LIGHT CHAINS
Kappa free light chain: 49 mg/L — ABNORMAL HIGH (ref 3.3–19.4)
Kappa, lambda light chain ratio: 0.76 (ref 0.26–1.65)
Lambda free light chains: 64.6 mg/L — ABNORMAL HIGH (ref 5.7–26.3)

## 2020-07-26 LAB — PTH, INTACT AND CALCIUM
Calcium, Total (PTH): 10.1 mg/dL (ref 8.7–10.3)
PTH: 118 pg/mL — ABNORMAL HIGH (ref 15–65)

## 2020-07-26 LAB — MAGNESIUM: Magnesium: 2.1 mg/dL (ref 1.7–2.4)

## 2020-07-26 LAB — C-REACTIVE PROTEIN: CRP: 12.7 mg/dL — ABNORMAL HIGH (ref <1.0)

## 2020-07-26 LAB — PARATHYROID HORMONE, INTACT (NO CA): PTH: 102 pg/mL — ABNORMAL HIGH (ref 15–65)

## 2020-07-26 LAB — FERRITIN: Ferritin: 627 ng/mL — ABNORMAL HIGH (ref 11–307)

## 2020-07-26 LAB — FIBRIN DERIVATIVES D-DIMER (ARMC ONLY): Fibrin derivatives D-dimer (ARMC): 2882.03 ng/mL (FEU) — ABNORMAL HIGH (ref 0.00–499.00)

## 2020-07-26 MED ORDER — BARICITINIB 2 MG PO TABS
4.0000 mg | ORAL_TABLET | Freq: Every day | ORAL | Status: DC
  Administered 2020-07-27 – 2020-08-04 (×9): 4 mg via ORAL
  Filled 2020-07-26 (×9): qty 2

## 2020-07-26 MED ORDER — POTASSIUM PHOSPHATES 15 MMOLE/5ML IV SOLN
30.0000 mmol | Freq: Once | INTRAVENOUS | Status: AC
  Administered 2020-07-26: 30 mmol via INTRAVENOUS
  Filled 2020-07-26: qty 10

## 2020-07-26 NOTE — Progress Notes (Signed)
[facility] Cardiology    SUBJECTIVE: Still doing somewhat well still has some shortness of breath and dyspnea denies any pain heart rate still tachycardic at times resting comfortably   Vitals:   07/26/20 0543 07/26/20 0642 07/26/20 0823 07/26/20 1235  BP:   115/76 (!) 116/98  Pulse:      Resp: (!) 22 17 (!) 22 (!) 23  Temp:   97.6 F (36.4 C) 97.8 F (36.6 C)  TempSrc:   Axillary Axillary  SpO2:   96% 93%  Weight:      Height:         Intake/Output Summary (Last 24 hours) at 07/26/2020 1621 Last data filed at 07/26/2020 1234 Gross per 24 hour  Intake 1433.97 ml  Output 1400 ml  Net 33.97 ml      PHYSICAL EXAM  General: Well developed, well nourished, in no acute distress HEENT:  Normocephalic and atramatic Neck:  No JVD.  Lungs: Clear bilaterally to auscultation and percussion. Heart: Irregular. Normal S1 and S2 without gallops or murmurs.  Abdomen: Bowel sounds are positive, abdomen soft and non-tender  Msk:  Back normal, normal gait. Normal strength and tone for age. Extremities: No clubbing, cyanosis or edema.   Neuro: Alert and oriented X 3. Psych:  Good affect, responds appropriately   LABS: Basic Metabolic Panel: Recent Labs    07/25/20 0656 07/26/20 0229  NA 140 141  K 3.5 3.0*  CL 108 109  CO2 23 24  GLUCOSE 141* 163*  BUN 98* 59*  CREATININE 1.36* 0.96  CALCIUM 10.2 10.1  MG 2.4 2.1  PHOS 3.3 1.8*   Liver Function Tests: Recent Labs    07/25/20 0656 07/26/20 0229  AST 48* 35  ALT 45* 39  ALKPHOS 63 60  BILITOT 0.7 0.6  PROT 7.0 6.7  ALBUMIN 2.3* 2.4*   No results for input(s): LIPASE, AMYLASE in the last 72 hours. CBC: Recent Labs    07/25/20 0656 07/26/20 0229  WBC 12.7* 12.7*  NEUTROABS 11.2* 10.9*  HGB 15.4* 14.9  HCT 45.3 43.3  MCV 81.8 81.5  PLT 405* 418*   Cardiac Enzymes: No results for input(s): CKTOTAL, CKMB, CKMBINDEX, TROPONINI in the last 72 hours. BNP: Invalid input(s): POCBNP D-Dimer: No results for input(s):  DDIMER in the last 72 hours. Hemoglobin A1C: No results for input(s): HGBA1C in the last 72 hours. Fasting Lipid Panel: No results for input(s): CHOL, HDL, LDLCALC, TRIG, CHOLHDL, LDLDIRECT in the last 72 hours. Thyroid Function Tests: Recent Labs    07/24/20 1145  TSH 2.850  T4TOTAL 5.3   Anemia Panel: Recent Labs    07/26/20 0229  FERRITIN 627*    US RENAL  Result Date: 07/25/2020 CLINICAL DATA:  Acute renal failure. EXAM: RENAL / URINARY TRACT ULTRASOUND COMPLETE COMPARISON:  None. FINDINGS: Right Kidney: Renal measurements: 12.0 x 4.5 x 3.9 cm = volume: 110 mL. Echogenicity within normal limits. No mass or hydronephrosis visualized. Left Kidney: Renal measurements: 12.7 x 5.6 x 4.4 cm = volume: 162 mL. Echogenicity within normal limits. No mass or hydronephrosis visualized. Bladder: Appears normal for degree of bladder distention. Other: None. IMPRESSION: Normal size kidneys without hydronephrosis. Electronically Signed   By: Marin Olp M.D.   On: 07/25/2020 16:40     Echo mild reduced left ventricular function globally EF around 45 to 50%  TELEMETRY: Atrial fibrillation rate of around 110  ASSESSMENT AND PLAN:  Principal Problem:   Sepsis ([place]) Active Problems:   Hypertension   Morbid obesity (  [place])   Hypercalcemia   Hyperthyroidism   AKI (acute kidney injury) ([place])   COVID-19 virus infection   Atrial fibrillation with RVR ([place])   Hypokalemia    Plan Continue therapy for sepsis broad-spectrum antibiotic therapy Agree with volume fluid resuscitation especially with mild hypotension Maintain IV anticoagulation with heparin for atrial fibrillation and DVT prophylaxis Will consider switching to long-term anticoagulation for A. fib upon discharge probably Eliquis 5 mg twice a day Continue hydration for renal insufficiency Maintain electrolyte monitoring and replacement as necessary Agree with supplemental oxygen and respiratory support for respiratory  failure Continue aggressive therapy for COVID-19   Yolonda Kida, MD 07/26/2020 4:21 PM

## 2020-07-26 NOTE — Consult Note (Signed)
[place] for Heparin Infusion Indication: atrial fibrillation  Allergies  Allergen Reactions  . Codeine     Patient Measurements: Height: 5\' 5"  (165.1 cm) Weight: 79.4 kg (175 lb) IBW/kg (Calculated) : 57 Heparin Dosing Weight: 73.7 kg  Vital Signs: Temp: 97.6 F (36.4 C) (09/22 0823) Temp Source: Axillary (09/22 0823) BP: 115/76 (09/22 0823) Pulse Rate: 35 (09/22 0500)  Labs: Recent Labs    07/24/20 0914 07/24/20 0914 07/25/20 0656 07/25/20 1831 07/26/20 0229 07/26/20 0910  HGB 15.9*   < > 15.4*  --  14.9  --   HCT 47.5*  --  45.3  --  43.3  --   PLT 436*  --  405*  --  418*  --   APTT <24*  --   --   --   --   --   LABPROT 15.4*  --  16.4*  --   --   --   INR 1.3*  --  1.4*  --   --   --   HEPARINUNFRC  --   --   --  0.11* 0.47 0.64  CREATININE 2.81*  --  1.36*  --  0.96  --   TROPONINIHS 10  --   --   --   --   --    < > = values in this interval not displayed.    Estimated Creatinine Clearance: 60.9 mL/min (by C-G formula based on SCr of 0.96 mg/dL).   Medical History: Past Medical History:  Diagnosis Date  . Allergy   . Elevated transaminase level   . Hyperlipidemia   . Hypertension   . Obesity     Medications:  No anticoagulation prior to admission per chart review  Assessment: Patient is a 64 y/o F with medical history including hypertension, hypothyroidism who is admitted with acute respiratory failure secondary to pneumonia. Admission complicated by atrial fibrillation with RVR. Pharmacy has been consulted to initiate heparin infusion for stroke prophylaxis in the setting of atrial fibrillation.   Baseline aPTT < 24, INR 1.4. H&H with mildly elevated H&H, platelets.   Hgb 15.9>15.4>14.9 Plt 244>010>272  9/21 1831 HL 0.11  9/22 0229 HL 0.47  9/22 0910 HL 0.64   Goal of Therapy:  Heparin level 0.3-0.7 units/ml Monitor platelets by anticoagulation protocol: Yes   Plan:  Heparin level is  therapeutic. Will continue current rate at 1300 units/hr. Recheck heparin level in and CBC with AM labs.   Sherilyn Banker, PharmD Pharmacy Resident  07/26/2020 10:06 AM

## 2020-07-26 NOTE — Progress Notes (Signed)
PROGRESS NOTE    Courtney Anderson  IRS:[id number] DOB: 12-13-1955 DOA: 07/24/2020 PCP: Courtney Maple, MD   Brief Narrative: Courtney Anderson a 64 y.o.femalewith medical history significant formorbid obesity, hypertension, hypothyroidism, dyslipidemia who was brought into the ER by EMS asa "CODE SEPSIS".Patient states that she has not felt well in over a week and complains of a cough, congestion and generalized weakness. She also had intermittent fever and chills. She states that she is usually able to care for herself but over the last 1 week has been unable to because she has felt unwell. Per EMS patient was noted to be tachycardic in the field but they were unable to get an adequate pulse ox reading and so she was placed on a nonrebreather mask. She received 500 cc of fluid in route to the hospital. Upon arrival to the ER she had room air pulse oximetry of 69% and so she remained on the nonrebreather mask with improvement in her pulse oximetry to the low 90s. She denies having any nausea, no vomiting, no changes in her bowel habits, no abdominal pain, no chest pain, no dizziness or lightheadedness. She denies having any falls. Patient is also noted to have multiple areas of redness and excoriation under both breasts, abdominal creases and in between her upper thighs.Redness noted to buttocks but no skin break at this time. Labs on admission show sodium 132, potassium 3.0, chloride 94, bicarb 20, BUN 74, creatinine 2.81, calcium 10.4, AST 60, ALT 54, total protein 8.1, BNP111,lactic acid 3.9, white count 15 with a left shift, hemoglobin 15.9, hematocrit 47.5, MCV 82.9, RDW 14.6, platelet count 436. Patient's COVID-19 PCR test is positive. Chest x-ray reviewed by me shows air bronchograms in the right lung base. Twelve-lead EKG shows rapid A. fib with multiple PVCs and incomplete right bundle branch block and left anterior fascicular block.   Assessment & Plan:   Principal  Problem:   Sepsis ([place]) Active Problems:   Hypertension   Morbid obesity (Courtney Anderson)   Hypercalcemia   Hyperthyroidism   AKI (acute kidney injury) ([place])   COVID-19 virus infection   Atrial fibrillation with RVR ([facility])   Hypokalemia  Acute hypoxic respiratory failure and severe sepsis secondary to COVID-19 pneumonia: Patient meets criteria for severe sepsis based on tachycardia, tachypnea, leukocytosis, acute kidney injury, hypoxia and lactic acid of 3.9.  Lactic acid has improved to 2.5 today. Leukocytosis improved as well.  Patient remains afebrile.  Blood pressure fairly stable. Currently on 30 L high flow nasal oxygen.  Patient feels well.  Able to speak in full sentences.  We will continue Solu-Medrol, remdesivir, zinc, vitamin C, antitussives, bronchodilators.  Patient was encouraged to prone, out of bed to chair, to use incentive spirometry and flutter valve. Actemra/Baricitinib  off label use - patient was told that if COVID-19 pneumonitis gets worse we might potentially use Actemra/ barititinib off label, patient denies any known history of active diverticulitis, tuberculosis or hepatitis, understands the risks and benefits and wants to proceed with Actemra/barititinib treatment.  Will start that.  Will start on 2 mg based on her GFR which is 43 at this point in time.  Once her GFR is greater than 60, this can be increased to 4 mg. Elevated pro calcitonin, suspecting for possible bacterial infection.  Continue Rocephin and Zithromax.  Follow cultures.  Acute kidney injury: Likely due to ATN due to sepsis.  Creatinine upon presentation at 2.81 and improved to 1.36.  Nephrology on board.  Management per  them.  New onset atrial fibrillation with rapid ventricular rate: Rates around 1 10-1 20. Cardiology on board.  She has been started on amiodarone and IV heparin drip.  Echo is pending.  Cardiology managing.  Appreciate their help and defer to them.  Hypokalemia: Resolved but had borderline.   Will give her 1 more dose orally today.  Hypercalcemia:  Patient has a history of hypercalcemia in the past and has a known thyroid nodule Now corrected.  Morbid obesity: Complicates overall prognosis and care.  Weight loss counseling provided.  Possible skin tears: Present on admission and mostly under her skin folds (breast, abdomen and groin). Wound care consulted.  Eating hospitalist discussed with wound care nurse who recommends at least a 5-day course of Diflucan to cover for fungal etiology  DVT prophylaxis: Heparin Drip Code Status: Full code Family Communication: None at bedside. Disposition Plan: Home.  Status is: Inpatient Dispo: The patient is from: Home              Anticipated d/c is to: Home              Anticipated d/c date is: > 3 days              Patient currently is not medically stable to d/c.  Consultants:  Nephrology : Dr.Kolluru.   Procedures:  ECHO : pending.  Antimicrobials:  Anti-infectives (From admission, onward)   Start     Dose/Rate Route Frequency Ordered Stop   07/25/20 1000  azithromycin (ZITHROMAX) 500 mg in sodium chloride 0.9 % 250 mL IVPB  Status:  Discontinued        500 mg 250 mL/hr over 60 Minutes Intravenous Every 24 hours 07/24/20 1108 07/24/20 1317   07/25/20 1000  remdesivir 100 mg in sodium chloride 0.9 % 100 mL IVPB       "Followed by" Linked Group Details   100 mg 200 mL/hr over 30 Minutes Intravenous Daily 07/24/20 1112 07/29/20 0959   07/24/20 1300  fluconazole (DIFLUCAN) tablet 100 mg  Status:  Discontinued        100 mg Oral Daily 07/24/20 1251 07/25/20 1522   07/24/20 1200  remdesivir 200 mg in sodium chloride 0.9% 250 mL IVPB       "Followed by" Linked Group Details   200 mg 580 mL/hr over 30 Minutes Intravenous Once 07/24/20 1112 07/24/20 1424   07/24/20 1115  cefTRIAXone (ROCEPHIN) 2 g in sodium chloride 0.9 % 100 mL IVPB  Status:  Discontinued        2 g 200 mL/hr over 30 Minutes Intravenous Every 24 hours  07/24/20 1108 07/24/20 1113   07/24/20 1000  cefTRIAXone (ROCEPHIN) 2 g in sodium chloride 0.9 % 100 mL IVPB        2 g 200 mL/hr over 30 Minutes Intravenous Every 24 hours 07/24/20 0947     07/24/20 1000  azithromycin (ZITHROMAX) 500 mg in sodium chloride 0.9 % 250 mL IVPB        500 mg 250 mL/hr over 60 Minutes Intravenous Every 24 hours 07/24/20 0947        Subjective: Pt seen today for f/u she is resting bed and paying her bills. Vitals are stable except for RR of 23. Pt seen by nephrology today and home bp meds held.   Objective: Vitals:   07/26/20 0543 07/26/20 0642 07/26/20 0823 07/26/20 1235  BP:   115/76 (!) 116/98  Pulse:      Resp: (!) 22 17 Marland Kitchen)  22 (!) 23  Temp:   97.6 F (36.4 C) 97.8 F (36.6 C)  TempSrc:   Axillary Axillary  SpO2:   96% 93%  Weight:      Height:        Intake/Output Summary (Last 24 hours) at 07/26/2020 1632 Last data filed at 07/26/2020 1234 Gross per 24 hour  Intake 1433.97 ml  Output 1400 ml  Net 33.97 ml   Filed Weights   07/24/20 0900  Weight: 79.4 kg   Examination: Blood pressure (!) 116/98, pulse (!) 35, temperature 97.8 F (36.6 C), temperature source Axillary, resp. rate (!) 23, height 5\' 5"  (1.651 m), weight 79.4 kg, SpO2 93 %. General exam: Appears calm and comfortable  Respiratory system: Clear to auscultation. Respiratory effort normal. Cardiovascular system: S1 & S2 heard, RRR. No JVD, murmurs, rubs, gallops or clicks. No pedal edema. Gastrointestinal system: Abdomen is nondistended, soft and nontender. No organomegaly or masses felt. Normal bowel sounds heard. Central nervous system: Alert and oriented. No focal neurological deficits. Extremities: Symmetric 5 x 5 power. Skin: No rashes, lesions or ulcers Psychiatry: Judgement and insight appear normal. Mood & affect appropriate.  Data Reviewed: I have personally reviewed following labs and imaging studies  I/O last 3 completed shifts: In: 3983.5 [I.V.:3248.4; IV  Piggyback:735.2] Out: 2000 [Urine:2000] Total I/O In: -  Out: 600 [Urine:600] Lab Results  Component Value Date   CREATININE 0.96 07/26/2020   CREATININE 1.36 (H) 07/25/2020   CREATININE 2.81 (H) 07/24/2020   CBC: Recent Labs  Lab 07/24/20 0914 07/25/20 0656 07/26/20 0229  WBC 15.0* 12.7* 12.7*  NEUTROABS 13.1* 11.2* 10.9*  HGB 15.9* 15.4* 14.9  HCT 47.5* 45.3 43.3  MCV 82.9 81.8 81.5  PLT 436* 405* 606*   Basic Metabolic Panel: Recent Labs  Lab 07/24/20 0914 07/24/20 1425 07/25/20 0656 07/26/20 0229  NA 132*  --  140 141  K 3.0*  --  3.5 3.0*  CL 94*  --  108 109  CO2 20*  --  23 24  GLUCOSE 152*  --  141* 163*  BUN 74*  --  98* 59*  CREATININE 2.81*  --  1.36* 0.96  CALCIUM 10.4* 10.1 10.2 10.1  MG  --   --  2.4 2.1  PHOS  --   --  3.3 1.8*   GFR: Estimated Creatinine Clearance: 60.9 mL/min (by C-G formula based on SCr of 0.96 mg/dL). Liver Function Tests: Recent Labs  Lab 07/24/20 0914 07/25/20 0656 07/26/20 0229  AST 60* 48* 35  ALT 54* 45* 39  ALKPHOS 70 63 60  BILITOT 1.1 0.7 0.6  PROT 8.1 7.0 6.7  ALBUMIN 2.7* 2.3* 2.4*   Coagulation Profile: Recent Labs  Lab 07/24/20 0914 07/25/20 0656  INR 1.3* 1.4*   Thyroid Function Tests: Recent Labs    07/24/20 1145  TSH 2.850  T4TOTAL 5.3   Anemia Panel: Recent Labs    07/25/20 0656 07/26/20 0229  FERRITIN 594* 627*   Sepsis Labs: Recent Labs  Lab 07/24/20 0914 07/24/20 1145 07/24/20 1425 07/25/20 0656 07/25/20 1521  PROCALCITON  --   --  1.35 1.39  --   LATICACIDVEN 3.9* 2.5*  --   --  2.6*    Recent Results (from the past 240 hour(s))  Urine culture     Status: Abnormal   Collection Time: 07/24/20  9:12 AM   Specimen: Urine, Random  Result Value Ref Range Status   Specimen Description   Final    URINE,  RANDOM Performed at [facility], [address] [place], [place] [address]    Special Requests   Final    NONE Performed at [facility], [address] [place], [place] [address]    Culture (A)  Final    <10,000 COLONIES/mL >=100,000 COLONIES/mL Performed at [facility], [place] [address] [place], [place] [address]    Report Status 07/25/2020 FINAL  Final  Blood culture (routine single)     Status: None (Preliminary result)   Collection Time: 07/24/20  9:14 AM   Specimen: BLOOD LEFT ARM  Result Value Ref Range Status   Specimen Description BLOOD LEFT ARM  Final   Special Requests   Final    BOTTLES DRAWN AEROBIC AND ANAEROBIC Blood Culture adequate volume   Culture   Final    NO GROWTH 2 DAYS Performed at [facility], [address] [place], [place] [address]    Report Status PENDING  Incomplete  SARS Coronavirus 2 by RT PCR (hospital order, performed in [place] hospital lab) Nasopharyngeal Nasopharyngeal Swab     Status: Abnormal   Collection Time: 07/24/20  9:16 AM   Specimen: Nasopharyngeal Swab  Result Value Ref Range Status   SARS Coronavirus 2 POSITIVE (A) NEGATIVE Final    Comment: RESULT CALLED TO, READ BACK BY AND VERIFIED WITH:  SAMANTHA HAMILTON AT 1937 07/24/20 SDR (NOTE) SARS-CoV-2 target nucleic acids are DETECTED  SARS-CoV-2 RNA is generally detectable in upper respiratory specimens  during the acute phase of infection.  Positive results are indicative  of the presence of the identified virus, but do not rule out bacterial infection or co-infection with other pathogens not detected by the test.  Clinical correlation with patient history and  other diagnostic information is necessary to determine patient infection status.  The expected result is negative.  Fact Sheet for Patients:   StrictlyIdeas.no   Fact Sheet for Healthcare Providers:   BankingDealers.co.za    This test is not yet approved or cleared by the [place] FDA and  has been authorized for detection and/or diagnosis of SARS-CoV-2 by FDA under an Emergency Use  Authorization (EUA).  This EUA will remain in effect (meaning thi s test can be used) for the duration of  the COVID-19 declaration under Section 564(b)(1) of the Act, 21 U.S.C. section 360-bbb-3(b)(1), unless the authorization is terminated or revoked sooner.  Performed at [facility], [address], [place], [place] [address]     Radiology Studies: US RENAL  Result Date: 07/25/2020 CLINICAL DATA:  Acute renal failure. EXAM: RENAL / URINARY TRACT ULTRASOUND COMPLETE COMPARISON:  None. FINDINGS: Right Kidney: Renal measurements: 12.0 x 4.5 x 3.9 cm = volume: 110 mL. Echogenicity within normal limits. No mass or hydronephrosis visualized. Left Kidney: Renal measurements: 12.7 x 5.6 x 4.4 cm = volume: 162 mL. Echogenicity within normal limits. No mass or hydronephrosis visualized. Bladder: Appears normal for degree of bladder distention. Other: None. IMPRESSION: Normal size kidneys without hydronephrosis. Electronically Signed   By: Marin Olp M.D.   On: 07/25/2020 16:40   Scheduled Meds: . albuterol  2 puff Inhalation Q6H  . vitamin C  500 mg Oral Daily  . aspirin EC  81 mg Oral Daily  . [START ON 07/27/2020] baricitinib  4 mg Oral Daily  . cholecalciferol  2,000 Units Oral Daily  . Gerhardt's butt cream   Topical TID  . methylPREDNISolone (SOLU-MEDROL) injection  0.5 mg/kg Intravenous Q12H   Followed  by  . Derrill Memo ON 07/27/2020] predniSONE  50 mg Oral Daily  . metoprolol succinate  50 mg Oral Daily  . multivitamin with minerals  1 tablet Oral Daily  . nystatin   Topical TID  . pneumococcal 23 valent vaccine  0.5 mL Intramuscular Tomorrow-1000  . zinc sulfate  220 mg Oral Daily   Continuous Infusions: . azithromycin 500 mg (07/26/20 1234)  . cefTRIAXone (ROCEPHIN)  IV 2 g (07/26/20 1137)  . heparin 1,300 Units/hr (07/26/20 0509)  . potassium PHOSPHATE IVPB (in mmol) 30 mmol (07/26/20 1135)  . remdesivir 100 mg in NS 100 mL 100 mg (07/26/20 1045)    LOS: 2 days    Para Skeans, MD [facility] Pager [phone] If 7PM-7AM, please contact night-coverage [email or web address] Password [facility] 07/26/2020, 4:32 PM

## 2020-07-26 NOTE — Telephone Encounter (Signed)
Routing to provider  

## 2020-07-26 NOTE — Consult Note (Signed)
[place] for Heparin Infusion Indication: atrial fibrillation  Allergies  Allergen Reactions  . Codeine     Patient Measurements: Height: 5\' 5"  (165.1 cm) Weight: 79.4 kg (175 lb) IBW/kg (Calculated) : 57 Heparin Dosing Weight: 73.7 kg  Vital Signs: Temp: 97.6 F (36.4 C) (09/21 2042) Temp Source: Axillary (09/21 2042) BP: 102/67 (09/22 0300) Pulse Rate: 69 (09/22 0000)  Labs: Recent Labs    07/24/20 0914 07/24/20 0914 07/25/20 0656 07/25/20 1831 07/26/20 0229  HGB 15.9*   < > 15.4*  --  14.9  HCT 47.5*  --  45.3  --  43.3  PLT 436*  --  405*  --  418*  APTT <24*  --   --   --   --   LABPROT 15.4*  --  16.4*  --   --   INR 1.3*  --  1.4*  --   --   HEPARINUNFRC  --   --   --  0.11* 0.47  CREATININE 2.81*  --  1.36*  --  0.96  TROPONINIHS 10  --   --   --   --    < > = values in this interval not displayed.    Estimated Creatinine Clearance: 60.9 mL/min (by C-G formula based on SCr of 0.96 mg/dL).   Medical History: Past Medical History:  Diagnosis Date  . Allergy   . Elevated transaminase level   . Hyperlipidemia   . Hypertension   . Obesity     Medications:  No anticoagulation prior to admission per chart review  Assessment: Patient is a 64 y/o F with medical history including hypertension, hypothyroidism who is admitted with acute respiratory failure secondary to pneumonia. Admission complicated by atrial fibrillation with RVR. Pharmacy has been consulted to initiate heparin infusion for stroke prophylaxis in the setting of atrial fibrillation.   Baseline aPTT < 24, INR 1.4. H&H with mildly elevated H&H, platelets.   9/11 1831 HL 0.11   Goal of Therapy:  Heparin level 0.3-0.7 units/ml Monitor platelets by anticoagulation protocol: Yes   Plan:  Heparin level is subtherapeutic. Will give 2500 unit bolus and increase heparin infusion to 1300 units/hr. Recheck heparin level in 6 hours. CBC with AM labs.    9/22 :  HL @ 0229 = 0.47 Will continue this pt on current rate and draw confirmation level on 9/22 @ [place] D, PharmD 07/26/2020,4:22 AM

## 2020-07-26 NOTE — Progress Notes (Addendum)
[facility]  ROUNDING NOTE   Subjective:  Courtney Anderson admitted to [facility]  for not feeling well with a cough and weakness. She endorses fevers and chills. Patient was found to be hypoxic and positive for COVID-19.    Objective:  Vital signs in last 24 hours:  Temp:  [97.6 F (36.4 C)-97.8 F (36.6 C)] 97.8 F (36.6 C) (09/22 1235) Pulse Rate:  [32-151] 35 (09/22 0500) Resp:  [17-32] 22 (09/22 0823) BP: (86-139)/(56-126) 116/98 (09/22 1235) SpO2:  [69 %-98 %] 93 % (09/22 1235)  Weight change:  Filed Weights   07/24/20 0900  Weight: 79.4 kg    Intake/Output: I/O last 3 completed shifts: In: 3983.5 [I.V.:3248.4; IV Piggyback:735.2] Out: 2000 [Urine:2000]   Intake/Output this shift:  No intake/output data recorded.  Physical Exam: General: Awake,alert, pleasant  Head: Normocephalic, atraumatic. Moist oral mucosal membranes  Eyes: Anicteric, PERRL  Neck: Supple, trachea midline  Lungs:  Clear to auscultation bilaterally,diminished at the bases  Heart: S1S2 Irregular  Abdomen:  Soft, nontender,   Extremities:  No peripheral edema.  Neurologic: Nonfocal, moving all four extremities  Skin: No acute rashes or  lesions       Basic Metabolic Panel: Recent Labs  Lab 07/24/20 0914 07/24/20 1425 07/25/20 0656 07/26/20 0229  NA 132*  --  140 141  K 3.0*  --  3.5 3.0*  CL 94*  --  108 109  CO2 20*  --  23 24  GLUCOSE 152*  --  141* 163*  BUN 74*  --  98* 59*  CREATININE 2.81*  --  1.36* 0.96  CALCIUM 10.4* 10.1 10.2 10.1  MG  --   --  2.4 2.1  PHOS  --   --  3.3 1.8*    Liver Function Tests: Recent Labs  Lab 07/24/20 0914 07/25/20 0656 07/26/20 0229  AST 60* 48* 35  ALT 54* 45* 39  ALKPHOS 70 63 60  BILITOT 1.1 0.7 0.6  PROT 8.1 7.0 6.7  ALBUMIN 2.7* 2.3* 2.4*   No results for input(s): LIPASE, AMYLASE in the last 168 hours. No results for input(s): AMMONIA in the last 168 hours.  CBC: Recent Labs  Lab 07/24/20 0914 07/25/20 0656  07/26/20 0229  WBC 15.0* 12.7* 12.7*  NEUTROABS 13.1* 11.2* 10.9*  HGB 15.9* 15.4* 14.9  HCT 47.5* 45.3 43.3  MCV 82.9 81.8 81.5  PLT 436* 405* 418*    Cardiac Enzymes: No results for input(s): CKTOTAL, CKMB, CKMBINDEX, TROPONINI in the last 168 hours.  BNP: Invalid input(s): POCBNP  CBG: No results for input(s): GLUCAP in the last 168 hours.  Microbiology: Results for orders placed or performed during the hospital encounter of 07/24/20  Urine culture     Status: Abnormal   Collection Time: 07/24/20  9:12 AM   Specimen: Urine, Random  Result Value Ref Range Status   Specimen Description   Final    URINE, RANDOM Performed at [facility], [address], [place], [place] [address]    Special Requests   Final    NONE Performed at [facility], [address], [place], [place] [address]    Culture (A)  Final    <10,000 COLONIES/mL >=100,000 COLONIES/mL Performed at [facility], [place] [address], [place],  [address]    Report Status 07/25/2020 FINAL  Final  Blood culture (routine single)     Status: None (Preliminary result)   Collection Time: 07/24/20  9:14 AM  Specimen: BLOOD LEFT ARM  Result Value Ref Range Status   Specimen Description BLOOD LEFT ARM  Final   Special Requests   Final    BOTTLES DRAWN AEROBIC AND ANAEROBIC Blood Culture adequate volume   Culture   Final    NO GROWTH 2 DAYS Performed at [facility], [address], [place], [place] [address]    Report Status PENDING  Incomplete  SARS Coronavirus 2 by RT PCR (hospital order, performed in [place] hospital lab) Nasopharyngeal Nasopharyngeal Swab     Status: Abnormal   Collection Time: 07/24/20  9:16 AM   Specimen: Nasopharyngeal Swab  Result Value Ref Range Status   SARS Coronavirus 2 POSITIVE (A) NEGATIVE Final    Comment: RESULT CALLED TO, READ BACK BY AND VERIFIED WITH:  SAMANTHA HAMILTON AT 1937 07/24/20 SDR (NOTE) SARS-CoV-2 target nucleic acids  are DETECTED  SARS-CoV-2 RNA is generally detectable in upper respiratory specimens  during the acute phase of infection.  Positive results are indicative  of the presence of the identified virus, but do not rule out bacterial infection or co-infection with other pathogens not detected by the test.  Clinical correlation with patient history and  other diagnostic information is necessary to determine patient infection status.  The expected result is negative.  Fact Sheet for Patients:   StrictlyIdeas.no   Fact Sheet for Healthcare Providers:   BankingDealers.co.za    This test is not yet approved or cleared by the [place] FDA and  has been authorized for detection and/or diagnosis of SARS-CoV-2 by FDA under an Emergency Use Authorization (EUA).  This EUA will remain in effect (meaning thi s test can be used) for the duration of  the COVID-19 declaration under Section 564(b)(1) of the Act, 21 U.S.C. section 360-bbb-3(b)(1), unless the authorization is terminated or revoked sooner.  Performed at [facility], [address] [place], [place] [address]     Coagulation Studies: Recent Labs    07/24/20 0914 07/25/20 0656  LABPROT 15.4* 16.4*  INR 1.3* 1.4*    Urinalysis: Recent Labs    07/24/20 0912  COLORURINE YELLOW*  LABSPEC 1.015  PHURINE 5.0  GLUCOSEU NEGATIVE  HGBUR LARGE*  BILIRUBINUR NEGATIVE  KETONESUR NEGATIVE  PROTEINUR 30*  NITRITE NEGATIVE  LEUKOCYTESUR LARGE*      Imaging: US RENAL  Result Date: 07/25/2020 CLINICAL DATA:  Acute renal failure. EXAM: RENAL / URINARY TRACT ULTRASOUND COMPLETE COMPARISON:  None. FINDINGS: Right Kidney: Renal measurements: 12.0 x 4.5 x 3.9 cm = volume: 110 mL. Echogenicity within normal limits. No mass or hydronephrosis visualized. Left Kidney: Renal measurements: 12.7 x 5.6 x 4.4 cm = volume: 162 mL. Echogenicity within normal limits. No mass or hydronephrosis  visualized. Bladder: Appears normal for degree of bladder distention. Other: None. IMPRESSION: Normal size kidneys without hydronephrosis. Electronically Signed   By: Marin Olp M.D.   On: 07/25/2020 16:40     Medications:   . azithromycin 500 mg (07/26/20 1234)  . cefTRIAXone (ROCEPHIN)  IV 2 g (07/26/20 1137)  . heparin 1,300 Units/hr (07/26/20 0509)  . potassium PHOSPHATE IVPB (in mmol) 30 mmol (07/26/20 1135)  . remdesivir 100 mg in NS 100 mL 100 mg (07/26/20 1045)   . albuterol  2 puff Inhalation Q6H  . vitamin C  500 mg Oral Daily  . aspirin EC  81 mg Oral Daily  . [START ON 07/27/2020] baricitinib  4 mg Oral Daily  . cholecalciferol  2,000 Units Oral Daily  .  Gerhardt's butt cream   Topical TID  . methylPREDNISolone (SOLU-MEDROL) injection  0.5 mg/kg Intravenous Q12H   Followed by  . [START ON 07/27/2020] predniSONE  50 mg Oral Daily  . metoprolol succinate  50 mg Oral Daily  . multivitamin with minerals  1 tablet Oral Daily  . nystatin   Topical TID  . pneumococcal 23 valent vaccine  0.5 mL Intramuscular Tomorrow-1000  . zinc sulfate  220 mg Oral Daily   acetaminophen **OR** acetaminophen, guaiFENesin-dextromethorphan, hydrALAZINE, HYDROcodone-acetaminophen, ondansetron **OR** ondansetron (ZOFRAN) IV  Assessment/ Plan:  Courtney Anderson is a 64 y.o.  female admitted to [facility]  for not feeling well with a cough and weakness. She endorses fevers and chills. Patient was found to be hypoxic and positive for COVID-19.  # Acute renal failure:  -with baseline creatinine of 0.97 with normal GFR.   Acute renal failure most likely secondary to concurrent illness with prerenal azotemia.  NSAID and hypercalcemia may have played a role.  No IV contrast exposure.  Creatinine today is 0.96  -Renal Ultrasound  IMPRESSION: Normal size kidneys without hydronephrosis  -Continue holding home olmesartan and hydrochlorothiazide   2. Hypotension:   Blood Pressure readings within  normal range today Continues on metoprolol for rate control.  Echocardiogram result pending  3. Atrial fibrillation:  - on Metoprolol, rate controlled  4. Hypercalcemia: on over the counter vitamin D supplements. Holding hydrochlorothiazide. Followed by endocrinology as an outpatient.  Calcium 10.1 today Phosphorus 1.8   Continue monitoring labs   LOS: 2 Princy Raju 9/22/202112:38 PM  Patient was seen and examined with Crosby Oyster, DNP. Kidney function back to baseline and within normal limits. Hypercalcemia seems consistent with primary hyperparathyroidism with PTH of 102-118 on this admission.  Will most likely need a PTH reuptake scan by nuclear med and outpatient follow up.  Continue to hold hydrochlorothiazide on discharge.  SPEP/UPEP, vitamin D level and PTH related peptide pending.  Above plan was discussed and agreed upon on the signing of this note.   Lavonia Dana, [facility]  9/22/202112:55 PM

## 2020-07-26 NOTE — Telephone Encounter (Signed)
Requested medications are due for refill today? Yes  Requested medications are on active medication list?  Yes  Last Refill:   06/19/2020 # 30 with no refills  Future visit scheduled? No   Notes to Clinic:  Medication failed Rx refill protocol due to no valid encounter in the past 6 months.  Last visit was 9 months ago.

## 2020-07-26 NOTE — Plan of Care (Signed)
Pt resting in the bed comfortably She appears to be in no apparent distress HFNC 12L. HR remains elevated 110-140. Pt refuses repositioning. Not able to have patient prone. Education provided. Pt still refuses self proning.  Denies any pain unless assessing wounds.  C-diff sample not sent during this shift. No bowel movement. Good urine output.  Pt updated on plan of care Time allowed for questions and concerns Verbalizes an understanding.  Denies any additional wants or needs at this time Call bell within reach Will continue to closely monitor.   Problem: Education: Goal: Knowledge of General Education information will improve Description: Including pain rating scale, medication(s)/side effects and non-pharmacologic comfort measures Outcome: Progressing   Problem: Health Behavior/Discharge Planning: Goal: Ability to manage health-related needs will improve Outcome: Progressing   Problem: Clinical Measurements: Goal: Ability to maintain clinical measurements within normal limits will improve Outcome: Progressing Goal: Will remain free from infection Outcome: Progressing Goal: Diagnostic test results will improve Outcome: Progressing Goal: Respiratory complications will improve Outcome: Progressing Goal: Cardiovascular complication will be avoided Outcome: Progressing   Problem: Activity: Goal: Risk for activity intolerance will decrease Outcome: Progressing   Problem: Nutrition: Goal: Adequate nutrition will be maintained Outcome: Progressing   Problem: Coping: Goal: Level of anxiety will decrease Outcome: Progressing   Problem: Elimination: Goal: Will not experience complications related to bowel motility Outcome: Progressing Goal: Will not experience complications related to urinary retention Outcome: Progressing   Problem: Pain Managment: Goal: General experience of comfort will improve Outcome: Progressing   Problem: Safety: Goal: Ability to remain free  from injury will improve Outcome: Progressing   Problem: Skin Integrity: Goal: Risk for impaired skin integrity will decrease Outcome: Progressing

## 2020-07-27 DIAGNOSIS — R6520 Severe sepsis without septic shock: Secondary | ICD-10-CM | POA: Diagnosis not present

## 2020-07-27 DIAGNOSIS — I4891 Unspecified atrial fibrillation: Secondary | ICD-10-CM | POA: Diagnosis not present

## 2020-07-27 DIAGNOSIS — N179 Acute kidney failure, unspecified: Secondary | ICD-10-CM | POA: Diagnosis not present

## 2020-07-27 DIAGNOSIS — A419 Sepsis, unspecified organism: Secondary | ICD-10-CM | POA: Diagnosis not present

## 2020-07-27 LAB — COMPREHENSIVE METABOLIC PANEL
ALT: 35 U/L (ref 0–44)
AST: 29 U/L (ref 15–41)
Albumin: 2.5 g/dL — ABNORMAL LOW (ref 3.5–5.0)
Alkaline Phosphatase: 62 U/L (ref 38–126)
Anion gap: 11 (ref 5–15)
BUN: 29 mg/dL — ABNORMAL HIGH (ref 8–23)
CO2: 22 mmol/L (ref 22–32)
Calcium: 9.9 mg/dL (ref 8.9–10.3)
Chloride: 105 mmol/L (ref 98–111)
Creatinine, Ser: 0.84 mg/dL (ref 0.44–1.00)
GFR calc Af Amer: >60 mL/min (ref >60)
GFR calc non Af Amer: >60 mL/min (ref >60)
Glucose, Bld: 180 mg/dL — ABNORMAL HIGH (ref 70–99)
Potassium: 3.1 mmol/L — ABNORMAL LOW (ref 3.5–5.1)
Sodium: 138 mmol/L (ref 135–145)
Total Bilirubin: 0.5 mg/dL (ref 0.3–1.2)
Total Protein: 7 g/dL (ref 6.5–8.1)

## 2020-07-27 LAB — CBC WITH DIFFERENTIAL/PLATELET
Abs Immature Granulocytes: 0.54 10*3/uL — ABNORMAL HIGH (ref 0.00–0.07)
Basophils Absolute: 0.1 10*3/uL (ref 0.0–0.1)
Basophils Relative: 0 %
Eosinophils Absolute: 0 10*3/uL (ref 0.0–0.5)
Eosinophils Relative: 0 %
HCT: 44.5 % (ref 36.0–46.0)
Hemoglobin: 14.8 g/dL (ref 12.0–15.0)
Immature Granulocytes: 4 %
Lymphocytes Relative: 6 %
Lymphs Abs: 0.8 10*3/uL (ref 0.7–4.0)
MCH: 28.2 pg (ref 26.0–34.0)
MCHC: 33.3 g/dL (ref 30.0–36.0)
MCV: 84.9 fL (ref 80.0–100.0)
Monocytes Absolute: 0.5 10*3/uL (ref 0.1–1.0)
Monocytes Relative: 4 %
Neutro Abs: 10.9 10*3/uL — ABNORMAL HIGH (ref 1.7–7.7)
Neutrophils Relative %: 86 %
Platelets: 460 10*3/uL — ABNORMAL HIGH (ref 150–400)
RBC: 5.24 MIL/uL — ABNORMAL HIGH (ref 3.87–5.11)
RDW: 14.9 % (ref 11.5–15.5)
WBC: 12.8 10*3/uL — ABNORMAL HIGH (ref 4.0–10.5)
nRBC: 0.2 % (ref 0.0–0.2)

## 2020-07-27 LAB — PROTEIN ELECTRO, RANDOM URINE
Albumin ELP, Urine: 23.8 %
Alpha-1-Globulin, U: 2.8 %
Alpha-2-Globulin, U: 8 %
Beta Globulin, U: 41.9 %
Gamma Globulin, U: 23.5 %
Total Protein, Urine: 30.8 mg/dL

## 2020-07-27 LAB — FIBRIN DERIVATIVES D-DIMER (ARMC ONLY): Fibrin derivatives D-dimer (ARMC): 1858.6 ng/mL (FEU) — ABNORMAL HIGH (ref 0.00–499.00)

## 2020-07-27 LAB — FERRITIN: Ferritin: 518 ng/mL — ABNORMAL HIGH (ref 11–307)

## 2020-07-27 LAB — HEPARIN LEVEL (UNFRACTIONATED): Heparin Unfractionated: 0.51 IU/mL (ref 0.30–0.70)

## 2020-07-27 LAB — PHOSPHORUS: Phosphorus: 1.9 mg/dL — ABNORMAL LOW (ref 2.5–4.6)

## 2020-07-27 LAB — MAGNESIUM: Magnesium: 1.8 mg/dL (ref 1.7–2.4)

## 2020-07-27 LAB — C-REACTIVE PROTEIN: CRP: 5.5 mg/dL — ABNORMAL HIGH (ref <1.0)

## 2020-07-27 MED ORDER — AMIODARONE HCL IN DEXTROSE 360-4.14 MG/200ML-% IV SOLN
60.0000 mg/h | INTRAVENOUS | Status: DC
  Administered 2020-07-27: 60 mg/h via INTRAVENOUS
  Filled 2020-07-27 (×2): qty 200

## 2020-07-27 MED ORDER — DILTIAZEM HCL 25 MG/5ML IV SOLN
5.0000 mg | Freq: Once | INTRAVENOUS | Status: AC
  Administered 2020-07-27: 5 mg via INTRAVENOUS
  Filled 2020-07-27: qty 5

## 2020-07-27 MED ORDER — DILTIAZEM LOAD VIA INFUSION
10.0000 mg | Freq: Once | INTRAVENOUS | Status: AC
  Administered 2020-07-27: 10 mg via INTRAVENOUS
  Filled 2020-07-27: qty 10

## 2020-07-27 MED ORDER — MAGNESIUM SULFATE 2 GM/50ML IV SOLN
2.0000 g | Freq: Once | INTRAVENOUS | Status: AC
  Administered 2020-07-27: 2 g via INTRAVENOUS
  Filled 2020-07-27: qty 50

## 2020-07-27 MED ORDER — POTASSIUM PHOSPHATES 15 MMOLE/5ML IV SOLN
30.0000 mmol | Freq: Once | INTRAVENOUS | Status: AC
  Administered 2020-07-27: 30 mmol via INTRAVENOUS
  Filled 2020-07-27: qty 10

## 2020-07-27 MED ORDER — AMIODARONE HCL IN DEXTROSE 360-4.14 MG/200ML-% IV SOLN
30.0000 mg/h | INTRAVENOUS | Status: DC
  Administered 2020-07-28 – 2020-07-30 (×5): 30 mg/h via INTRAVENOUS
  Filled 2020-07-27 (×5): qty 200

## 2020-07-27 MED ORDER — METOPROLOL TARTRATE 5 MG/5ML IV SOLN
5.0000 mg | INTRAVENOUS | Status: DC | PRN
  Administered 2020-07-27 – 2020-08-10 (×24): 5 mg via INTRAVENOUS
  Filled 2020-07-27 (×27): qty 5

## 2020-07-27 MED ORDER — POTASSIUM CHLORIDE CRYS ER 20 MEQ PO TBCR
40.0000 meq | EXTENDED_RELEASE_TABLET | Freq: Once | ORAL | Status: AC
  Administered 2020-07-27: 40 meq via ORAL
  Filled 2020-07-27: qty 2

## 2020-07-27 MED ORDER — AMIODARONE LOAD VIA INFUSION
150.0000 mg | Freq: Once | INTRAVENOUS | Status: AC
  Administered 2020-07-27: 150 mg via INTRAVENOUS
  Filled 2020-07-27: qty 83.34

## 2020-07-27 MED ORDER — METOPROLOL TARTRATE 5 MG/5ML IV SOLN
10.0000 mg | Freq: Once | INTRAVENOUS | Status: AC
  Administered 2020-07-27: 10 mg via INTRAVENOUS
  Filled 2020-07-27: qty 10

## 2020-07-27 MED ORDER — DILTIAZEM HCL-DEXTROSE 125-5 MG/125ML-% IV SOLN (PREMIX)
5.0000 mg/h | INTRAVENOUS | Status: DC
  Administered 2020-07-27: 5 mg/h via INTRAVENOUS
  Filled 2020-07-27: qty 125

## 2020-07-27 MED ORDER — DILTIAZEM HCL-DEXTROSE 125-5 MG/125ML-% IV SOLN (PREMIX)
5.0000 mg/h | INTRAVENOUS | Status: DC
  Administered 2020-07-27: 5 mg/h via INTRAVENOUS
  Administered 2020-07-28 – 2020-07-31 (×8): 15 mg/h via INTRAVENOUS
  Filled 2020-07-27 (×10): qty 125

## 2020-07-27 MED ORDER — DILTIAZEM LOAD VIA INFUSION
20.0000 mg | Freq: Once | INTRAVENOUS | Status: AC
  Administered 2020-07-27: 20 mg via INTRAVENOUS
  Filled 2020-07-27: qty 20

## 2020-07-27 MED ORDER — METOPROLOL TARTRATE 5 MG/5ML IV SOLN
5.0000 mg | Freq: Once | INTRAVENOUS | Status: AC
  Administered 2020-07-27: 5 mg via INTRAVENOUS
  Filled 2020-07-27: qty 5

## 2020-07-27 NOTE — Progress Notes (Signed)
Pt HR continues to be 120-16 Dr Posey Pronto notified. Diltiazem bolus given.  Pt resting comfortably in bed.  No apparent distress noted.  Will continue to closely monitor.

## 2020-07-27 NOTE — Progress Notes (Signed)
PHARMACY CONSULT NOTE - FOLLOW UP  Pharmacy Consult for Electrolyte Monitoring and Replacement   Recent Labs: Potassium (mmol/L)  Date Value  07/27/2020 3.1 (L)   Magnesium (mg/dL)  Date Value  07/27/2020 1.8   Calcium (mg/dL)  Date Value  07/27/2020 9.9   Calcium, Total (PTH) (mg/dL)  Date Value  07/24/2020 10.1   Albumin (g/dL)  Date Value  07/27/2020 2.5 (L)  10/22/2019 4.2   Phosphorus (mg/dL)  Date Value  07/27/2020 1.9 (L)   Sodium (mmol/L)  Date Value  07/27/2020 138  10/22/2019 139   Assessment: 64 yo female admitted with Sepsis, acute respiratory failure, and new onset A. Fib. Pharmacy consulted for electrolyte monitoring and replacement.   Goal of Therapy:  Electrolytes WNL K> 4 Mg >2   Plan:  9/23 K 3.1, Phos: 1.9, Mg: 1.8- Patient has already received Magnesium 2g IV and KPhos 54mmol IV x 1.   Will order KCL 42mEq PO x 1 dose.   Plan to F/U with AM labs and continue to replace as needed.   Pernell Dupre, PharmD, BCPS Clinical Pharmacist 07/27/2020 5:39 PM

## 2020-07-27 NOTE — Progress Notes (Signed)
Cross Cover Note Inability to obtain control of afib with RVR even with additional IV metoprolol.  Cardizem bolus and infusion started.

## 2020-07-27 NOTE — Consult Note (Signed)
[facility] for Heparin Infusion Indication: atrial fibrillation  Allergies  Allergen Reactions  . Codeine     Patient Measurements: Height: 5\' 5"  (165.1 cm) Weight: 79.4 kg (175 lb) IBW/kg (Calculated) : 57 Heparin Dosing Weight: 73.7 kg  Vital Signs: Temp: 97.3 F (36.3 C) (09/23 0358) Temp Source: Oral (09/23 0358) BP: 106/68 (09/23 0640) Pulse Rate: 114 (09/23 0640)  Labs: Recent Labs    07/24/20 0914 07/24/20 0914 07/25/20 0656 07/25/20 1831 07/26/20 0229 07/26/20 0910 07/27/20 0548  HGB 15.9*   < > 15.4*  --  14.9  --  14.8  HCT 47.5*   < > 45.3  --  43.3  --  44.5  PLT 436*   < > 405*  --  418*  --  460*  APTT <24*  --   --   --   --   --   --   LABPROT 15.4*  --  16.4*  --   --   --   --   INR 1.3*  --  1.4*  --   --   --   --   HEPARINUNFRC  --   --   --    < > 0.47 0.64 0.51  CREATININE 2.81*  --  1.36*  --  0.96  --   --   TROPONINIHS 10  --   --   --   --   --   --    < > = values in this interval not displayed.    Estimated Creatinine Clearance: 60.9 mL/min (by C-G formula based on SCr of 0.96 mg/dL).   Medical History: Past Medical History:  Diagnosis Date  . Allergy   . Elevated transaminase level   . Hyperlipidemia   . Hypertension   . Obesity     Medications:  No anticoagulation prior to admission per chart review  Assessment: Patient is a 64 y/o F with medical history including hypertension, hypothyroidism who is admitted with acute respiratory failure secondary to pneumonia. Admission complicated by atrial fibrillation with RVR. Pharmacy has been consulted to initiate heparin infusion for stroke prophylaxis in the setting of atrial fibrillation.   Baseline aPTT < 24, INR 1.4. H&H with mildly elevated H&H, platelets.   Hgb 15.9>15.4>14.9 Plt 349>179>150  9/21 1831 HL 0.11  9/22 0229 HL 0.47  9/22 0910 HL 0.64 9/23 0548 HL 0.51    Goal of Therapy:  Heparin level 0.3-0.7 units/ml Monitor  platelets by anticoagulation protocol: Yes   Plan:  Heparin level is therapeutic. Will continue current rate at 1300 units/hr. Recheck heparin level in and CBC with AM labs.    Orene Desanctis, PharmD Pharmacy Resident  07/27/2020 6:43 AM

## 2020-07-27 NOTE — Progress Notes (Signed)
Dr Posey Pronto paged Pt HR remains elevated. 130-160 Cardizem gtt to be restarted.  Charge RN made aware.

## 2020-07-27 NOTE — Progress Notes (Signed)
Dr Clayborn Bigness notified per Dr Posey Pronto request HR elevated.  Cardizem gtt @12 .5mg  Awaiting orders.  Charge RN aware.

## 2020-07-27 NOTE — Progress Notes (Signed)
Pt's HR has been in the 120-160's. NP Hassan Rowan made aware, I will administer 5 mg of IV Lopressor as ordered and contnue to monitor the pt.

## 2020-07-27 NOTE — Evaluation (Signed)
Physical Therapy Evaluation Patient Details Name: Courtney Anderson MRN: [id number] DOB: January 20, 1956 Today's Date: 07/27/2020   History of Present Illness  Courtney Anderson is a 76yoF PMH: HTN, hypoTSH, who comes to [facility] on 9/20 after 1 weeks of cough, congestion, weakness, intermittent fever chills. Pt admitted with code sepsis, (+) COVID19. Pt having issues with AF/RVR upon arrival.  Clinical Impression  Pt admitted with above diagnosis. Pt currently with functional limitations due to the deficits listed below (see "PT Problem List"). Upon entry, pt in bed, awake and agreeable to participate. The pt is alert and oriented x4, pleasant, conversational, and generally a good historian. Pt actively in RVR upon entry 130s-160s bpm in bed, hence exam is limited. HFNC is upside down, author assists with correction with includes temporary unplugging Rt ear oximetry sensor which is passing through HFNC. After correction, ends remain in nares with greater success. Muscle testing reveals robust strength in 4 limbs. Attempts to scoot up in bed are futile, largely due to mechanics of air mattress. Pt has good safety awareness regarding her lines during testing. Functional mobility assessment demonstrates increased effort/time requirements, fair tolerance, and need for physical assistance, whereas the patient performed these at a higher level of independence PTA. Will plan on OOB assessment once VSS. Pt will benefit from skilled PT intervention to increase independence and safety with basic mobility in preparation for discharge to the venue listed below.       Follow Up Recommendations Home health PT;Supervision for mobility/OOB    Equipment Recommendations  Rolling walker with 5" wheels    Recommendations for Other Services       Precautions / Restrictions Precautions Precautions: Fall Precaution Comments: multiple wounds Restrictions Weight Bearing Restrictions: No      Mobility  Bed Mobility                General bed mobility comments: unable to pull up in bed, likely most impaired but air mattress strife; other mobility deferred due to RVR/tachycardia 150s-160s.  Transfers                    Ambulation/Gait                Stairs            Wheelchair Mobility    Modified Rankin (Stroke Patients Only)       Balance                                             Pertinent Vitals/Pain Pain Assessment: No/denies pain    Home Living Family/patient expects to be discharged to:: Private residence Living Arrangements: Children (lives with one son, 2nd son also helps) Available Help at Discharge: Family Type of Home: Mobile home Home Access: Stairs to enter   Technical brewer of Steps: 8 Home Layout: One level Home Equipment: None      Prior Function Level of Independence: Independent         Comments: reportedly was driving, independent with IADL, ADL, but 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 [employer]        Extremity/Trunk Assessment   Upper Extremity Assessment Upper Extremity Assessment: Overall WFL for tasks assessed    Lower Extremity Assessment Lower Extremity Assessment: Overall WFL for tasks assessed  Communication      Cognition Arousal/Alertness: Awake/alert Behavior During Therapy: WFL for tasks assessed/performed Overall Cognitive Status: Within Functional Limits for tasks assessed                                        General Comments      Exercises     Assessment/Plan    PT Assessment Patient needs continued PT services  PT Problem List Decreased activity tolerance;Decreased knowledge of use of DME;Decreased mobility;Cardiopulmonary status limiting activity       PT Treatment Interventions DME instruction;Balance training;Gait training;Stair training;Functional mobility training;Therapeutic activities;Therapeutic  exercise;Patient/family education    PT Goals (Current goals can be found in the Care Plan section)  Acute Rehab PT Goals Patient Stated Goal: return to home PT Goal Formulation: With patient Time For Goal Achievement: 08/10/20 Potential to Achieve Goals: Good    Frequency Min 2X/week   Barriers to discharge        Co-evaluation               AM-PAC PT "6 Clicks" Mobility  Outcome Measure Help needed turning from your back to your side while in a flat bed without using bedrails?: A Lot Help needed moving from lying on your back to sitting on the side of a flat bed without using bedrails?: A Little Help needed moving to and from a bed to a chair (including a wheelchair)?: A Little Help needed standing up from a chair using your arms (e.g., wheelchair or bedside chair)?: A Little Help needed to walk in hospital room?: A Little Help needed climbing 3-5 steps with a railing? : A Little 6 Click Score: 17    End of Session Equipment Utilized During Treatment: Oxygen Activity Tolerance: Patient tolerated treatment well;No increased pain;Treatment limited secondary to medical complications (Comment) Patient left: in bed;with call bell/phone within reach;with bed alarm set Nurse Communication: Mobility status PT Visit Diagnosis: Other abnormalities of gait and mobility (R26.89);Difficulty in walking, not elsewhere classified (R26.2)    Time: 6720-9470 PT Time Calculation (min) (ACUTE ONLY): 26 min   Charges:   PT Evaluation $PT Eval Moderate Complexity: 1 Mod          5:05 PM, 07/27/20 Etta Grandchild, PT, DPT Physical Therapist - [facility]  [phone] ([place])    Courtney Anderson 07/27/2020, 5:00 PM

## 2020-07-27 NOTE — Progress Notes (Signed)
PROGRESS NOTE    Courtney Anderson  LGX:[id number] DOB: April 27, 1956 DOA: 07/24/2020 PCP: Courtney Maple, MD   Brief Narrative: Courtney Anderson a 64 y.o.femalewith medical history significant formorbid obesity, hypertension, hypothyroidism, dyslipidemia who was brought into the ER by EMS asa "CODE SEPSIS".Patient states that she has not felt well in over a week and complains of a cough, congestion and generalized weakness. She also had intermittent fever and chills. She states that she is usually able to care for herself but over the last 1 week has been unable to because she has felt unwell. Per EMS patient was noted to be tachycardic in the field but they were unable to get an adequate pulse ox reading and so she was placed on a nonrebreather mask. She received 500 cc of fluid in route to the hospital. Upon arrival to the ER she had room air pulse oximetry of 69% and so she remained on the nonrebreather mask with improvement in her pulse oximetry to the low 90s. She denies having any nausea, no vomiting, no changes in her bowel habits, no abdominal pain, no chest pain, no dizziness or lightheadedness. She denies having any falls. Patient is also noted to have multiple areas of redness and excoriation under both breasts, abdominal creases and in between her upper thighs.Redness noted to buttocks but no skin break at this time. Labs on admission show sodium 132, potassium 3.0, chloride 94, bicarb 20, BUN 74, creatinine 2.81, calcium 10.4, AST 60, ALT 54, total protein 8.1, BNP111,lactic acid 3.9, white count 15 with a left shift, hemoglobin 15.9, hematocrit 47.5, MCV 82.9, RDW 14.6, platelet count 436. Patient's COVID-19 PCR test is positive. Chest x-ray reviewed by me shows air bronchograms in the right lung base. Twelve-lead EKG shows rapid A. fib with multiple PVCs and incomplete right bundle branch block and left anterior fascicular block.   Assessment & Plan:   Principal  Problem:   Sepsis ([place]) Active Problems:   Hypertension   Morbid obesity ([place])   Hypercalcemia   Hyperthyroidism   AKI (acute kidney injury) ([place])   COVID-19 virus infection   Atrial fibrillation with RVR (H[facility])   Hypokalemia  Acute hypoxic respiratory failure and severe sepsis secondary to COVID-19 pneumonia: Patient meets criteria for severe sepsis based on tachycardia, tachypnea, leukocytosis, acute kidney injury, hypoxia and lactic acid of 3.9.  Lactic acid has improved to 2.5 today. Leukocytosis improved as well.  Patient remains afebrile.  Blood pressure fairly stable. Currently on 30 L high flow nasal oxygen.  Patient feels well.  Able to speak in full sentences.  We will continue Solu-Medrol, remdesivir, zinc, vitamin C, antitussives, bronchodilators.  Patient was encouraged to prone, out of bed to chair, to use incentive spirometry and flutter valve. Actemra/Baricitinib  off label use - patient was told that if COVID-19 pneumonitis gets worse we might potentially use Actemra/ barititinib off label, patient denies any known history of active diverticulitis, tuberculosis or hepatitis, understands the risks and benefits and wants to proceed with Actemra/barititinib treatment.  Will start that.  Will start on 2 mg based on her GFR which is 43 at this point in time.  Once her GFR is greater than 60, this can be increased to 4 mg. Elevated pro calcitonin, suspecting for possible bacterial infection.  Continue Rocephin and Zithromax.  Follow cultures. 9/23 Pt is stable. SpO2: 100 % O2 Flow Rate (L/min): 14 L/min COVID-19 Labs  Recent Labs    07/25/20 0656 07/26/20 0229 07/27/20 0548  FERRITIN 594* 627* 518*  CRP 20.4* 12.7* 5.5*    Lab Results  Component Value Date   SARSCOV2NAA POSITIVE (A) 07/24/2020     Acute kidney injury: Likely due to ATN due to sepsis.  Creatinine upon presentation at 2.81 and improved to 1.36.  Nephrology on board.  Management per them. Lab Results    Component Value Date   CREATININE 0.84 07/27/2020   CREATININE 0.96 07/26/2020   CREATININE 1.36 (H) 07/25/2020  resolved.   New onset atrial fibrillation with rapid ventricular rate: Rates around 1 10-1 20. Cardiology on board.  She has been started on amiodarone and IV heparin drip.  Echo is pending.  Cardiology managing.  Appreciate their help and defer to them. Diltiazem was d/c but HR was still in a.fib rvr and  Therefore has been restarted.   Hypokalemia: Resolved but had borderline.  Will give her 1 more dose orally today. Pharmacy consulted to replacement.   Hypercalcemia:  Patient has a history of hypercalcemia in the past and has a known thyroid nodule Now corrected.  Morbid obesity: Complicates overall prognosis and care.  Weight loss counseling provided.  Possible skin tears: Present on admission and mostly under her skin folds (breast, abdomen and groin). Wound care consulted.  Eating hospitalist discussed with wound care nurse who recommends at least a 5-day course of Diflucan to cover for fungal etiology  DVT prophylaxis: Heparin Drip Code Status: Full code Family Communication: None at bedside. Disposition Plan: Home.  Status is: Inpatient Dispo: The patient is from: Home              Anticipated d/c is to: Home              Anticipated d/c date is: > 3 days              Patient currently is not medically stable to d/c.  Consultants:  Nephrology : Dr.Kolluru.   Procedures:  ECHO : pending.  Antimicrobials:  Anti-infectives (From admission, onward)   Start     Dose/Rate Route Frequency Ordered Stop   07/25/20 1000  azithromycin (ZITHROMAX) 500 mg in sodium chloride 0.9 % 250 mL IVPB  Status:  Discontinued        500 mg 250 mL/hr over 60 Minutes Intravenous Every 24 hours 07/24/20 1108 07/24/20 1317   07/25/20 1000  remdesivir 100 mg in sodium chloride 0.9 % 100 mL IVPB       "Followed by" Linked Group Details   100 mg 200 mL/hr over 30 Minutes  Intravenous Daily 07/24/20 1112 07/29/20 0959   07/24/20 1300  fluconazole (DIFLUCAN) tablet 100 mg  Status:  Discontinued        100 mg Oral Daily 07/24/20 1251 07/25/20 1522   07/24/20 1200  remdesivir 200 mg in sodium chloride 0.9% 250 mL IVPB       "Followed by" Linked Group Details   200 mg 580 mL/hr over 30 Minutes Intravenous Once 07/24/20 1112 07/24/20 1424   07/24/20 1115  cefTRIAXone (ROCEPHIN) 2 g in sodium chloride 0.9 % 100 mL IVPB  Status:  Discontinued        2 g 200 mL/hr over 30 Minutes Intravenous Every 24 hours 07/24/20 1108 07/24/20 1113   07/24/20 1000  cefTRIAXone (ROCEPHIN) 2 g in sodium chloride 0.9 % 100 mL IVPB        2 g 200 mL/hr over 30 Minutes Intravenous Every 24 hours 07/24/20 0947     07/24/20 1000  azithromycin (  ZITHROMAX) 500 mg in sodium chloride 0.9 % 250 mL IVPB        500 mg 250 mL/hr over 60 Minutes Intravenous Every 24 hours 07/24/20 0947        Subjective: Pt seen today for f/u she is resting bed and paying her bills. Vitals are stable except for RR of 23. Pt seen by nephrology today and home bp meds held.   9/23 Pt is alert and is stable and oriented. PT eval shows pt can go home with [facility] and Home PT. Pt refuse to prone. Labs shows hypokalemia and hypomagnesemia.  Objective: Vitals:   07/27/20 1554 07/27/20 1633 07/27/20 1700 07/27/20 1710  BP: 114/81 (!) 112/94  115/75  Pulse:    (!) 102  Resp: 18 19 (!) 23 (!) 22  Temp:      TempSrc:      SpO2: 97%  100% 100%  Weight:      Height:        Intake/Output Summary (Last 24 hours) at 07/27/2020 1714 Last data filed at 07/27/2020 1600 Gross per 24 hour  Intake 1360 ml  Output 1000 ml  Net 360 ml   Filed Weights   07/24/20 0900  Weight: 79.4 kg   Examination: Blood pressure 115/75, pulse (!) 102, temperature 97.8 F (36.6 C), temperature source Oral, resp. rate (!) 22, height 5\' 5"  (1.651 m), weight 79.4 kg, SpO2 100 %. General exam: Appears calm and comfortable  Respiratory  system: Clear to auscultation. Respiratory effort normal. Cardiovascular system: S1 & S2 heard, RRR. No JVD, murmurs, rubs, gallops or clicks. No pedal edema. Gastrointestinal system: Abdomen is nondistended, soft and nontender. No organomegaly or masses felt. Normal bowel sounds heard. Central nervous system: Alert and oriented. No focal neurological deficits. Extremities: Symmetric 5 x 5 power. Skin: No rashes, lesions or ulcers Psychiatry: Judgement and insight appear normal. Mood & affect appropriate.  Data Reviewed: I have personally reviewed following labs and imaging studies  I/O last 3 completed shifts: In: 850 [IV Piggyback:850] Out: 1600 [Urine:1600] Total I/O In: 860 [P.O.:360; IV Piggyback:500] Out: 600 [Urine:600] Lab Results  Component Value Date   CREATININE 0.84 07/27/2020   CREATININE 0.96 07/26/2020   CREATININE 1.36 (H) 07/25/2020   CBC: Recent Labs  Lab 07/24/20 0914 07/25/20 0656 07/26/20 0229 07/27/20 0548  WBC 15.0* 12.7* 12.7* 12.8*  NEUTROABS 13.1* 11.2* 10.9* 10.9*  HGB 15.9* 15.4* 14.9 14.8  HCT 47.5* 45.3 43.3 44.5  MCV 82.9 81.8 81.5 84.9  PLT 436* 405* 418* 824*   Basic Metabolic Panel: Recent Labs  Lab 07/24/20 0914 07/24/20 1425 07/25/20 0656 07/26/20 0229 07/27/20 0548  NA 132*  --  140 141 138  K 3.0*  --  3.5 3.0* 3.1*  CL 94*  --  108 109 105  CO2 20*  --  23 24 22   GLUCOSE 152*  --  141* 163* 180*  BUN 74*  --  98* 59* 29*  CREATININE 2.81*  --  1.36* 0.96 0.84  CALCIUM 10.4* 10.1 10.2 10.1 9.9  MG  --   --  2.4 2.1 1.8  PHOS  --   --  3.3 1.8* 1.9*   GFR: Estimated Creatinine Clearance: 69.6 mL/min (by C-G formula based on SCr of 0.84 mg/dL). Liver Function Tests: Recent Labs  Lab 07/24/20 0914 07/25/20 0656 07/26/20 0229 07/27/20 0548  AST 60* 48* 35 29  ALT 54* 45* 39 35  ALKPHOS 70 63 60 62  BILITOT 1.1 0.7 0.6  0.5  PROT 8.1 7.0 6.7 7.0  ALBUMIN 2.7* 2.3* 2.4* 2.5*   Coagulation Profile: Recent Labs  Lab  07/24/20 0914 07/25/20 0656  INR 1.3* 1.4*   Thyroid Function Tests: No results for input(s): TSH, T4TOTAL, FREET4, T3FREE, THYROIDAB in the last 72 hours. Anemia Panel: Recent Labs    07/26/20 0229 07/27/20 0548  FERRITIN 627* 518*   Sepsis Labs: Recent Labs  Lab 07/24/20 0914 07/24/20 1145 07/24/20 1425 07/25/20 0656 07/25/20 1521  PROCALCITON  --   --  1.35 1.39  --   LATICACIDVEN 3.9* 2.5*  --   --  2.6*    Recent Results (from the past 240 hour(s))  Urine culture     Status: Abnormal   Collection Time: 07/24/20  9:12 AM   Specimen: Urine, Random  Result Value Ref Range Status   Specimen Description   Final    URINE, RANDOM Performed at [facility], [address] [place], [place] [address]    Special Requests   Final    NONE Performed at [facility], [address], [place], [place] [address]    Culture (A)  Final    <10,000 COLONIES/mL >=100,000 COLONIES/mL Performed at [facility], [place] [address], [place], [place] [address]    Report Status 07/25/2020 FINAL  Final  Blood culture (routine single)     Status: None (Preliminary result)   Collection Time: 07/24/20  9:14 AM   Specimen: BLOOD LEFT ARM  Result Value Ref Range Status   Specimen Description BLOOD LEFT ARM  Final   Special Requests   Final    BOTTLES DRAWN AEROBIC AND ANAEROBIC Blood Culture adequate volume   Culture   Final    NO GROWTH 3 DAYS Performed at [facility], [address] [place], [place] [address]    Report Status PENDING  Incomplete  SARS Coronavirus 2 by RT PCR (hospital order, performed in [place] hospital lab) Nasopharyngeal Nasopharyngeal Swab     Status: Abnormal   Collection Time: 07/24/20  9:16 AM   Specimen: Nasopharyngeal Swab  Result Value Ref Range Status   SARS Coronavirus 2 POSITIVE (A) NEGATIVE Final    Comment: RESULT CALLED TO, READ BACK BY AND VERIFIED WITH:  SAMANTHA HAMILTON AT 9924 07/24/20  SDR (NOTE) SARS-CoV-2 target nucleic acids are DETECTED  SARS-CoV-2 RNA is generally detectable in upper respiratory specimens  during the acute phase of infection.  Positive results are indicative  of the presence of the identified virus, but do not rule out bacterial infection or co-infection with other pathogens not detected by the test.  Clinical correlation with patient history and  other diagnostic information is necessary to determine patient infection status.  The expected result is negative.  Fact Sheet for Patients:   StrictlyIdeas.no   Fact Sheet for Healthcare Providers:   BankingDealers.co.za    This test is not yet approved or cleared by the [place] FDA and  has been authorized for detection and/or diagnosis of SARS-CoV-2 by FDA under an Emergency Use Authorization (EUA).  This EUA will remain in effect (meaning thi s test can be used) for the duration of  the COVID-19 declaration under Section 564(b)(1) of the Act, 21 U.S.C. section 360-bbb-3(b)(1), unless the authorization is terminated or revoked sooner.  Performed at [facility], [address], [place],  [address]     Radiology Studies: No results found. Scheduled Meds: . albuterol  2 puff Inhalation Q6H  . vitamin  C  500 mg Oral Daily  . aspirin EC  81 mg Oral Daily  . baricitinib  4 mg Oral Daily  . cholecalciferol  2,000 Units Oral Daily  . Gerhardt's butt cream   Topical TID  . metoprolol succinate  50 mg Oral Daily  . multivitamin with minerals  1 tablet Oral Daily  . nystatin   Topical TID  . pneumococcal 23 valent vaccine  0.5 mL Intramuscular Tomorrow-1000  . predniSONE  50 mg Oral Daily  . zinc sulfate  220 mg Oral Daily   Continuous Infusions: . azithromycin 500 mg (07/27/20 1009)  . cefTRIAXone (ROCEPHIN)  IV 2 g (07/27/20 0900)  . diltiazem (CARDIZEM) infusion 5 mg/hr (07/27/20 1702)  . heparin 1,300 Units/hr  (07/27/20 0439)  . potassium PHOSPHATE IVPB (in mmol) 30 mmol (07/27/20 1151)  . remdesivir 100 mg in NS 100 mL 100 mg (07/27/20 0934)    LOS: 3 days   Para Skeans, MD [facility] Pager [phone] If 7PM-7AM, please contact night-coverage [email or web address] Password [facility] 07/27/2020, 5:14 PM

## 2020-07-27 NOTE — Progress Notes (Signed)
Cross Cover Patient started on amio gtt secondary to a fib with RVR rate up to 170's not improved with cardizem at max dose and IV metoprolol pushes. Plan to wean cardizem if able if rate improves

## 2020-07-27 NOTE — Progress Notes (Signed)
[facility] Cardiology    SUBJECTIVE: Patient states to be doing okay still has some mild tachycardia with A. fib rates around 100-1 10 no chest pain shortness of breath improved   Vitals:   07/27/20 0640 07/27/20 0645 07/27/20 0700 07/27/20 0741  BP: 106/68 119/72 119/64 100/67  Pulse: (!) 114 93 (!) 107 (!) 49  Resp: 20 20 (!) 23 18  Temp:    97.8 F (36.6 C)  TempSrc:    Oral  SpO2: 93% 90% 96% 98%  Weight:      Height:         Intake/Output Summary (Last 24 hours) at 07/27/2020 0901 Last data filed at 07/27/2020 0437 Gross per 24 hour  Intake 850 ml  Output 1600 ml  Net -750 ml      PHYSICAL EXAM  General: Well developed, well nourished, in no acute distress HEENT:  Normocephalic and atramatic Neck:  No JVD.  Lungs: Clear bilaterally to auscultation and percussion. Heart: Irregular tachycardic. Normal S1 and S2 without gallops or murmurs.  Abdomen: Bowel sounds are positive, abdomen soft and non-tender  Msk:  Back normal, normal gait. Normal strength and tone for age. Extremities: No clubbing, cyanosis or edema.   Neuro: Alert and oriented X 3. Psych:  Good affect, responds appropriately   LABS: Basic Metabolic Panel: Recent Labs    07/26/20 0229 07/27/20 0548  NA 141 138  K 3.0* 3.1*  CL 109 105  CO2 24 22  GLUCOSE 163* 180*  BUN 59* 29*  CREATININE 0.96 0.84  CALCIUM 10.1 9.9  MG 2.1 1.8  PHOS 1.8* 1.9*   Liver Function Tests: Recent Labs    07/26/20 0229 07/27/20 0548  AST 35 29  ALT 39 35  ALKPHOS 60 62  BILITOT 0.6 0.5  PROT 6.7 7.0  ALBUMIN 2.4* 2.5*   No results for input(s): LIPASE, AMYLASE in the last 72 hours. CBC: Recent Labs    07/26/20 0229 07/27/20 0548  WBC 12.7* 12.8*  NEUTROABS 10.9* 10.9*  HGB 14.9 14.8  HCT 43.3 44.5  MCV 81.5 84.9  PLT 418* 460*   Cardiac Enzymes: No results for input(s): CKTOTAL, CKMB, CKMBINDEX, TROPONINI in the last 72 hours. BNP: Invalid input(s): POCBNP D-Dimer: No results for input(s):  DDIMER in the last 72 hours. Hemoglobin A1C: No results for input(s): HGBA1C in the last 72 hours. Fasting Lipid Panel: No results for input(s): CHOL, HDL, LDLCALC, TRIG, CHOLHDL, LDLDIRECT in the last 72 hours. Thyroid Function Tests: Recent Labs    07/24/20 1145  TSH 2.850  T4TOTAL 5.3   Anemia Panel: Recent Labs    07/27/20 0548  FERRITIN 518*    US RENAL  Result Date: 07/25/2020 CLINICAL DATA:  Acute renal failure. EXAM: RENAL / URINARY TRACT ULTRASOUND COMPLETE COMPARISON:  None. FINDINGS: Right Kidney: Renal measurements: 12.0 x 4.5 x 3.9 cm = volume: 110 mL. Echogenicity within normal limits. No mass or hydronephrosis visualized. Left Kidney: Renal measurements: 12.7 x 5.6 x 4.4 cm = volume: 162 mL. Echogenicity within normal limits. No mass or hydronephrosis visualized. Bladder: Appears normal for degree of bladder distention. Other: None. IMPRESSION: Normal size kidneys without hydronephrosis. Electronically Signed   By: Marin Olp M.D.   On: 07/25/2020 16:40   ECHOCARDIOGRAM COMPLETE  Result Date: 07/26/2020    ECHOCARDIOGRAM REPORT   Patient Name:   Courtney Anderson Date of Exam: 07/25/2020 Medical Rec #:  [id number]      Height:       65.0  in Accession #:    [id number]     Weight:       175.0 lb Date of Birth:  [birth year]      BSA:          1.869 m Patient Age:    64 years       BP:           86/57 mmHg Patient Gender: F              HR:           105 bpm. Exam Location:  [facility] Procedure: 2D Echo, Cardiac Doppler and Color Doppler Indications:     Dyspnea 786.09  History:         Patient has no prior history of Echocardiogram examinations.                  Risk Factors:Hypertension and Dyslipidemia.  Sonographer:     Sherrie Sport RDCS (AE) Referring Phys:  [id number] Lylie Blacklock D Kelsey Edman Diagnosing Phys: Yolonda Kida MD  Sonographer Comments: Technically difficult study due to poor echo windows and no apical window. Bandage in left apical space- no apical attempted. IMPRESSIONS  1.  Techically difficult study.  2. Left ventricular ejection fraction, by estimation, is 45 to 50%. The left ventricle has mildly decreased function. The left ventricle demonstrates global hypokinesis. Left ventricular diastolic parameters are indeterminate.  3. Right ventricular systolic function is normal. The right ventricular size is normal.  4. The mitral valve is normal in structure. No evidence of mitral valve regurgitation.  5. The aortic valve is normal in structure. Aortic valve regurgitation is not visualized. Conclusion(s)/Recommendation(s): Poor windows for evaluation of left ventricular function by transthoracic echocardiography. Would recommend an alternative means of evaluation. FINDINGS  Left Ventricle: Left ventricular ejection fraction, by estimation, is 45 to 50%. The left ventricle has mildly decreased function. The left ventricle demonstrates global hypokinesis. The left ventricular internal cavity size was normal in size. There is  no left ventricular hypertrophy. Left ventricular diastolic parameters are indeterminate. Right Ventricle: The right ventricular size is normal. No increase in right ventricular wall thickness. Right ventricular systolic function is normal. Left Atrium: Left atrial size was normal in size. Right Atrium: Right atrial size was normal in size. Pericardium: There is no evidence of pericardial effusion. Mitral Valve: The mitral valve is normal in structure. No evidence of mitral valve regurgitation. Tricuspid Valve: The tricuspid valve is normal in structure. Tricuspid valve regurgitation is not demonstrated. Aortic Valve: The aortic valve is normal in structure. Aortic valve regurgitation is not visualized. Pulmonic Valve: The pulmonic valve was normal in structure. Pulmonic valve regurgitation is not visualized. Aorta: The ascending aorta was not well visualized. IAS/Shunts: No atrial level shunt detected by color flow Doppler. Additional Comments: Techically difficult  study.  LEFT VENTRICLE PLAX 2D LVIDd:         3.90 cm LVIDs:         3.47 cm LV PW:         1.44 cm LV IVS:        0.99 cm LVOT diam:     2.20 cm LVOT Area:     3.80 cm  LEFT ATRIUM         Index LA diam:    5.50 cm 2.94 cm/m   AORTA Ao Root diam: 2.90 cm  SHUNTS Systemic Diam: 2.20 cm Yolonda Kida MD Electronically signed by Yolonda Kida MD Signature Date/Time: 07/26/2020/5:48:12 PM  Final      Echo mildly reduced left ventricular function EF around 45 to 50%  TELEMETRY: Mild tachycardia heart rate between 101 10 looks to be atrial fibrillation irregular  ASSESSMENT AND PLAN:  Principal Problem:   Sepsis ([place]) Active Problems:   Hypertension   Morbid obesity ([place])   Hypercalcemia   Hyperthyroidism   AKI (acute kidney injury) ([place])   COVID-19 virus infection   Atrial fibrillation with RVR (HCC)   Hypokalemia    Plan Continue supplemental oxygen and supportive therapy for respiratory failure Continue IV short-term anticoagulation for A. fib hopefully will transition to long-term anticoagulation on discharge Continue rate management and control as necessary currently on metoprolol will consider adding digoxin as necessary or amiodarone Correct electrolytes with the hypokalemia Broad-spectrum antibiotic therapy for pneumonia and sepsis Continue therapy for COVID-19 infection Renal insufficiency should be treated as per nephrology with hydration Continue conservative cardiac input at this point her rate is not limiting her recovery but eventually will switch to possibly antiarrhythmic and long-term anticoagulation   Yolonda Kida, MD 07/27/2020 9:01 AM

## 2020-07-28 ENCOUNTER — Inpatient Hospital Stay: Payer: Medicare Other

## 2020-07-28 DIAGNOSIS — U071 COVID-19: Secondary | ICD-10-CM | POA: Diagnosis not present

## 2020-07-28 DIAGNOSIS — A419 Sepsis, unspecified organism: Secondary | ICD-10-CM | POA: Diagnosis not present

## 2020-07-28 DIAGNOSIS — I4891 Unspecified atrial fibrillation: Secondary | ICD-10-CM | POA: Diagnosis not present

## 2020-07-28 DIAGNOSIS — N179 Acute kidney failure, unspecified: Secondary | ICD-10-CM | POA: Diagnosis not present

## 2020-07-28 LAB — CBC WITH DIFFERENTIAL/PLATELET
Abs Immature Granulocytes: 0.46 10*3/uL — ABNORMAL HIGH (ref 0.00–0.07)
Basophils Absolute: 0.1 10*3/uL (ref 0.0–0.1)
Basophils Relative: 0 %
Eosinophils Absolute: 0 10*3/uL (ref 0.0–0.5)
Eosinophils Relative: 0 %
HCT: 44.3 % (ref 36.0–46.0)
Hemoglobin: 14.9 g/dL (ref 12.0–15.0)
Immature Granulocytes: 4 %
Lymphocytes Relative: 8 %
Lymphs Abs: 1 10*3/uL (ref 0.7–4.0)
MCH: 28.6 pg (ref 26.0–34.0)
MCHC: 33.6 g/dL (ref 30.0–36.0)
MCV: 85 fL (ref 80.0–100.0)
Monocytes Absolute: 0.6 10*3/uL (ref 0.1–1.0)
Monocytes Relative: 5 %
Neutro Abs: 10.3 10*3/uL — ABNORMAL HIGH (ref 1.7–7.7)
Neutrophils Relative %: 83 %
Platelets: 436 10*3/uL — ABNORMAL HIGH (ref 150–400)
RBC: 5.21 MIL/uL — ABNORMAL HIGH (ref 3.87–5.11)
RDW: 15.2 % (ref 11.5–15.5)
WBC: 12.4 10*3/uL — ABNORMAL HIGH (ref 4.0–10.5)
nRBC: 0.3 % — ABNORMAL HIGH (ref 0.0–0.2)

## 2020-07-28 LAB — COMPREHENSIVE METABOLIC PANEL
ALT: 31 U/L (ref 0–44)
AST: 27 U/L (ref 15–41)
Albumin: 2.5 g/dL — ABNORMAL LOW (ref 3.5–5.0)
Alkaline Phosphatase: 59 U/L (ref 38–126)
Anion gap: 11 (ref 5–15)
BUN: 18 mg/dL (ref 8–23)
CO2: 23 mmol/L (ref 22–32)
Calcium: 9.8 mg/dL (ref 8.9–10.3)
Chloride: 104 mmol/L (ref 98–111)
Creatinine, Ser: 0.84 mg/dL (ref 0.44–1.00)
GFR calc Af Amer: >60 mL/min (ref >60)
GFR calc non Af Amer: >60 mL/min (ref >60)
Glucose, Bld: 183 mg/dL — ABNORMAL HIGH (ref 70–99)
Potassium: 3.7 mmol/L (ref 3.5–5.1)
Sodium: 138 mmol/L (ref 135–145)
Total Bilirubin: 0.4 mg/dL (ref 0.3–1.2)
Total Protein: 6.9 g/dL (ref 6.5–8.1)

## 2020-07-28 LAB — PROCALCITONIN: Procalcitonin: 0.1 ng/mL

## 2020-07-28 LAB — T4, FREE: Free T4: 0.94 ng/dL (ref 0.61–1.12)

## 2020-07-28 LAB — TSH: TSH: 3.558 u[IU]/mL (ref 0.350–4.500)

## 2020-07-28 LAB — HEPARIN LEVEL (UNFRACTIONATED)
Heparin Unfractionated: 0.82 IU/mL — ABNORMAL HIGH (ref 0.30–0.70)
Heparin Unfractionated: 0.84 IU/mL — ABNORMAL HIGH (ref 0.30–0.70)
Heparin Unfractionated: 0.58 IU/mL (ref 0.30–0.70)

## 2020-07-28 LAB — MAGNESIUM: Magnesium: 2.1 mg/dL (ref 1.7–2.4)

## 2020-07-28 LAB — FIBRIN DERIVATIVES D-DIMER (ARMC ONLY): Fibrin derivatives D-dimer (ARMC): 1162.7 ng/mL (FEU) — ABNORMAL HIGH (ref 0.00–499.00)

## 2020-07-28 LAB — FERRITIN: Ferritin: 479 ng/mL — ABNORMAL HIGH (ref 11–307)

## 2020-07-28 LAB — PHOSPHORUS: Phosphorus: 2 mg/dL — ABNORMAL LOW (ref 2.5–4.6)

## 2020-07-28 LAB — C-REACTIVE PROTEIN: CRP: 3.8 mg/dL — ABNORMAL HIGH (ref <1.0)

## 2020-07-28 MED ORDER — METOPROLOL TARTRATE 5 MG/5ML IV SOLN
10.0000 mg | Freq: Once | INTRAVENOUS | Status: AC
  Administered 2020-07-28: 10 mg via INTRAVENOUS

## 2020-07-28 MED ORDER — POTASSIUM PHOSPHATES 15 MMOLE/5ML IV SOLN
30.0000 mmol | Freq: Once | INTRAVENOUS | Status: DC
  Filled 2020-07-28: qty 10

## 2020-07-28 MED ORDER — DILTIAZEM HCL 25 MG/5ML IV SOLN
5.0000 mg | Freq: Once | INTRAVENOUS | Status: AC
  Administered 2020-07-28: 5 mg via INTRAVENOUS
  Filled 2020-07-28: qty 5

## 2020-07-28 MED ORDER — DIGOXIN 0.25 MG/ML IJ SOLN: 0.2500 mg | Freq: Every day | INTRAMUSCULAR | Status: DC

## 2020-07-28 MED ORDER — IOHEXOL 350 MG/ML SOLN
100.0000 mL | Freq: Once | INTRAVENOUS | Status: AC | PRN
  Administered 2020-07-28: 100 mL via INTRAVENOUS

## 2020-07-28 MED ORDER — AMIODARONE LOAD VIA INFUSION
150.0000 mg | Freq: Once | INTRAVENOUS | Status: AC
  Administered 2020-07-28 (×2): 150 mg via INTRAVENOUS
  Filled 2020-07-28: qty 83.34

## 2020-07-28 MED ORDER — DIGOXIN 0.25 MG/ML IJ SOLN
0.5000 mg | Freq: Once | INTRAMUSCULAR | Status: AC
  Administered 2020-07-28: 0.5 mg via INTRAVENOUS
  Filled 2020-07-28: qty 2

## 2020-07-28 MED ORDER — AMIODARONE IV BOLUS ONLY 150 MG/100ML
150.0000 mg | Freq: Once | INTRAVENOUS | Status: DC
  Administered 2020-07-28: 150 mg via INTRAVENOUS

## 2020-07-28 MED ORDER — POTASSIUM CHLORIDE CRYS ER 20 MEQ PO TBCR: 40.0000 meq | EXTENDED_RELEASE_TABLET | Freq: Once | ORAL | Status: DC

## 2020-07-28 MED ORDER — DIGOXIN 0.25 MG/ML IJ SOLN: 0.1250 mg | Freq: Every day | INTRAMUSCULAR | Status: DC

## 2020-07-28 MED ORDER — POTASSIUM CHLORIDE CRYS ER 20 MEQ PO TBCR
40.0000 meq | EXTENDED_RELEASE_TABLET | Freq: Once | ORAL | Status: AC
  Administered 2020-07-28: 40 meq via ORAL
  Filled 2020-07-28: qty 2

## 2020-07-28 MED ORDER — DIGOXIN 0.25 MG/ML IJ SOLN: 0.2500 mg | Freq: Once | INTRAMUSCULAR | Status: DC

## 2020-07-28 MED ORDER — METOPROLOL SUCCINATE ER 50 MG PO TB24
100.0000 mg | ORAL_TABLET | Freq: Every day | ORAL | Status: DC
  Administered 2020-07-29 – 2020-07-30 (×2): 100 mg via ORAL
  Filled 2020-07-28: qty 1
  Filled 2020-07-28: qty 2
  Filled 2020-07-28: qty 1

## 2020-07-28 MED ORDER — K PHOS MONO-SOD PHOS DI & MONO 155-852-130 MG PO TABS
500.0000 mg | ORAL_TABLET | ORAL | Status: AC
  Administered 2020-07-28 (×4): 500 mg via ORAL
  Filled 2020-07-28 (×4): qty 2

## 2020-07-28 MED ORDER — K PHOS MONO-SOD PHOS DI & MONO 155-852-130 MG PO TABS
500.0000 mg | ORAL_TABLET | ORAL | Status: DC
  Filled 2020-07-28 (×2): qty 2

## 2020-07-28 NOTE — Consult Note (Signed)
[facility] for Heparin Infusion Indication: atrial fibrillation  Allergies  Allergen Reactions  . Codeine     Patient Measurements: Height: 5\' 5"  (165.1 cm) Weight: 79.4 kg (175 lb) IBW/kg (Calculated) : 57 Heparin Dosing Weight: 73.7 kg  Vital Signs: Temp: 97.6 F (36.4 C) (09/23 1915) Temp Source: Axillary (09/23 1915) BP: 106/71 (09/24 0600) Pulse Rate: 113 (09/23 2200)  Labs: Recent Labs    07/26/20 0229 07/26/20 0229 07/26/20 0910 07/27/20 0548 07/28/20 0501  HGB 14.9   < >  --  14.8 14.9  HCT 43.3  --   --  44.5 44.3  PLT 418*  --   --  460* 436*  HEPARINUNFRC 0.47   < > 0.64 0.51 0.82*  CREATININE 0.96  --   --  0.84  --    < > = values in this interval not displayed.    Estimated Creatinine Clearance: 69.6 mL/min (by C-G formula based on SCr of 0.84 mg/dL).   Medical History: Past Medical History:  Diagnosis Date  . Allergy   . Elevated transaminase level   . Hyperlipidemia   . Hypertension   . Obesity     Medications:  No anticoagulation prior to admission per chart review  Assessment: Patient is a 64 y/o F with medical history including hypertension, hypothyroidism who is admitted with acute respiratory failure secondary to pneumonia. Admission complicated by atrial fibrillation with RVR. Pharmacy has been consulted to initiate heparin infusion for stroke prophylaxis in the setting of atrial fibrillation.   Baseline aPTT < 24, INR 1.4. H&H with mildly elevated H&H, platelets.   Hgb 15.9>15.4>14.9 Plt 097>353>299  9/21 1831 HL 0.11  9/22 0229 HL 0.47  9/22 0910 HL 0.64 9/23 0548 HL 0.51  9/24 0501 HL 0.82    Goal of Therapy:  Heparin level 0.3-0.7 units/ml Monitor platelets by anticoagulation protocol: Yes   Plan:  Heparin level is therapeutic. Will continue current rate at 1300 units/hr. Recheck heparin level in and CBC with AM labs.   9/24 :  HL @ 0501 was elevated.  Will decrease drip rate to  1150 units/hr and order HL 6 hrs after rate change.    Orene Desanctis, PharmD Pharmacy Resident  07/28/2020 7:14 AM

## 2020-07-28 NOTE — Progress Notes (Signed)
[facility] Cardiology    SUBJECTIVE: Patient states she feels reasonably well this morning when she will get to go home currently her heart rates up to 150 with atrial fibrillation she denies any palpitations or tachycardia still slightly dyspneic.   Vitals:   07/27/20 1915 07/27/20 2200 07/28/20 0230 07/28/20 0600  BP: (!) 148/90 (!) 137/113 118/82 106/71  Pulse: 62 (!) 113    Resp: (!) 25 (!) 32    Temp: 97.6 F (36.4 C)     TempSrc: Axillary     SpO2: 94%   91%  Weight:      Height:         Intake/Output Summary (Last 24 hours) at 07/28/2020 5465 Last data filed at 07/27/2020 1600 Gross per 24 hour  Intake 860 ml  Output 600 ml  Net 260 ml      PHYSICAL EXAM  General: Well developed, well nourished, in no acute distress HEENT:  Normocephalic and atramatic Neck:  No JVD.  Lungs: Clear bilaterally to auscultation and percussion. Heart: Irregular tachycardic. Normal S1 and S2 without gallops or murmurs.  Abdomen: Bowel sounds are positive, abdomen soft and non-tender  Msk:  Back normal, normal gait. Normal strength and tone for age. Extremities: No clubbing, cyanosis or edema.   Neuro: Alert and oriented X 3. Psych:  Good affect, responds appropriately   LABS: Basic Metabolic Panel: Recent Labs    07/27/20 0548 07/28/20 0501  NA 138 138  K 3.1* 3.7  CL 105 104  CO2 22 23  GLUCOSE 180* 183*  BUN 29* 18  CREATININE 0.84 0.84  CALCIUM 9.9 9.8  MG 1.8 2.1  PHOS 1.9* 2.0*   Liver Function Tests: Recent Labs    07/27/20 0548 07/28/20 0501  AST 29 27  ALT 35 31  ALKPHOS 62 59  BILITOT 0.5 0.4  PROT 7.0 6.9  ALBUMIN 2.5* 2.5*   No results for input(s): LIPASE, AMYLASE in the last 72 hours. CBC: Recent Labs    07/27/20 0548 07/28/20 0501  WBC 12.8* 12.4*  NEUTROABS 10.9* 10.3*  HGB 14.8 14.9  HCT 44.5 44.3  MCV 84.9 85.0  PLT 460* 436*   Cardiac Enzymes: No results for input(s): CKTOTAL, CKMB, CKMBINDEX, TROPONINI in the last 72  hours. BNP: Invalid input(s): POCBNP D-Dimer: No results for input(s): DDIMER in the last 72 hours. Hemoglobin A1C: No results for input(s): HGBA1C in the last 72 hours. Fasting Lipid Panel: No results for input(s): CHOL, HDL, LDLCALC, TRIG, CHOLHDL, LDLDIRECT in the last 72 hours. Thyroid Function Tests: No results for input(s): TSH, T4TOTAL, T3FREE, THYROIDAB in the last 72 hours.  Invalid input(s): FREET3 Anemia Panel: Recent Labs    07/28/20 0501  FERRITIN 479*    No results found.   Echo borderline reduced left ventricular function 45 to 50%  TELEMETRY: Atrial fibrillation rapid ventricular response rate of 150  ASSESSMENT AND PLAN:  Principal Problem:   Sepsis ([place]) Active Problems:   Hypertension   Morbid obesity ([place])   Hypercalcemia   Hyperthyroidism   AKI (acute kidney injury) ([place])   COVID-19 virus infection   Atrial fibrillation with RVR (HCC)   Hypokalemia    Plan Continue current therapy for Covid pneumonia and sepsis Maintain supplemental oxygen as necessary Add digoxin IV for rate control for rapid atrial fibrillation Continue metoprolol digoxin amiodarone and heparin Rate control goal is 110-120 Continue broad-spectrum antibiotic therapy Correct electrolytes include hypokalemia Fluids as necessary for mild hypotension Continue supportive care for sepsis Covid  pneumonia Tachycardia is related to her underlying medical condition  Yolonda Kida, MD 07/28/2020 7:23 AM

## 2020-07-28 NOTE — Progress Notes (Signed)
PROGRESS NOTE    Courtney Anderson  YNW:[id number] DOB: February 17, 1956 DOA: 07/24/2020 PCP: Guadalupe Maple, MD   Brief Narrative: Courtney Anderson a 64 y.o.femalewith medical history significant formorbid obesity, hypertension, hypothyroidism, dyslipidemia who was brought into the ER by EMS asa "CODE SEPSIS".Patient states that she has not felt well in over a week and complains of a cough, congestion and generalized weakness. She also had intermittent fever and chills. She states that she is usually able to care for herself but over the last 1 week has been unable to because she has felt unwell. Per EMS patient was noted to be tachycardic in the field but they were unable to get an adequate pulse ox reading and so she was placed on a nonrebreather mask. She received 500 cc of fluid in route to the hospital. Upon arrival to the ER she had room air pulse oximetry of 69% and so she remained on the nonrebreather mask with improvement in her pulse oximetry to the low 90s. She denies having any nausea, no vomiting, no changes in her bowel habits, no abdominal pain, no chest pain, no dizziness or lightheadedness. She denies having any falls. Patient is also noted to have multiple areas of redness and excoriation under both breasts, abdominal creases and in between her upper thighs.Redness noted to buttocks but no skin break at this time. Labs on admission show sodium 132, potassium 3.0, chloride 94, bicarb 20, BUN 74, creatinine 2.81, calcium 10.4, AST 60, ALT 54, total protein 8.1, BNP111,lactic acid 3.9, white count 15 with a left shift, hemoglobin 15.9, hematocrit 47.5, MCV 82.9, RDW 14.6, platelet count 436. Patient's COVID-19 PCR test is positive. Chest x-ray reviewed by me shows air bronchograms in the right lung base. Twelve-lead EKG shows rapid A. fib with multiple PVCs and incomplete right bundle branch block and left anterior fascicular block.   Assessment & Plan:   Principal  Problem:   Sepsis ([place]) Active Problems:   Hypertension   Morbid obesity ([place])   Hypercalcemia   Hyperthyroidism   AKI (acute kidney injury) ([place])   COVID-19 virus infection   Atrial fibrillation with RVR (HCC)   Hypokalemia  Acute hypoxic respiratory failure and severe sepsis secondary to COVID-19 pneumonia: Patient meets criteria for severe sepsis based on tachycardia, tachypnea, leukocytosis, acute kidney injury, hypoxia and lactic acid of 3.9.  Lactic acid has improved to 2.5 today. Leukocytosis improved as well.  Patient remains afebrile.  Blood pressure fairly stable. Currently on 30 L high flow nasal oxygen.  Patient feels well.  Able to speak in full sentences.  We will continue Solu-Medrol, remdesivir, zinc, vitamin C, antitussives, bronchodilators.  Patient was encouraged to prone, out of bed to chair, to use incentive spirometry and flutter valve. Actemra/Baricitinib  off label use - patient was told that if COVID-19 pneumonitis gets worse we might potentially use Actemra/ barititinib off label, patient denies any known history of active diverticulitis, tuberculosis or hepatitis, understands the risks and benefits and wants to proceed with Actemra/barititinib treatment.  Will start that.  Will start on 2 mg based on her GFR which is 43 at this point in time.  Once her GFR is greater than 60, this can be increased to 4 mg. Elevated pro calcitonin, suspecting for possible bacterial infection.  Continue Rocephin and Zithromax.  Follow cultures. 9/23 Pt is stable. SpO2: (!) 82 % O2 Flow Rate (L/min): 15 L/min COVID-19 Labs  Recent Labs    07/26/20 0229 07/27/20 0548 07/28/20 0501  FERRITIN 627* 518* 479*  CRP 12.7* 5.5* 3.8*    Lab Results  Component Value Date   SARSCOV2NAA POSITIVE (A) 07/24/2020   9/24 Pt is stable and alert and her heart rate is high and cardiology is managing pt and is stated on digoxin and amiodarone. Pt is asymptomatic and Denies any complaints or  pain or chest pain or palpitation.   Acute kidney injury: Likely due to ATN due to sepsis.  Creatinine upon presentation at 2.81 and improved to 1.36.  Nephrology on board.  Management per them. Lab Results  Component Value Date   CREATININE 0.84 07/28/2020   CREATININE 0.84 07/27/2020   CREATININE 0.96 07/26/2020  Resolved.   New onset atrial fibrillation with rapid ventricular rate: Rates around 1 10-1 20. Cardiology on board.  She has been started on amiodarone and IV heparin drip.  Echo is pending.  Cardiology managing.  Appreciate their help and defer to them. Diltiazem was d/c but HR was still in a.fib rvr and  Therefore has been restarted.  Pt has been in sinus tachycardia and has been started on digoxin and amiodarone.   Hypokalemia: Resolved but had borderline.  Will give her 1 more dose orally today. Pharmacy consulted to replacement.   Hypercalcemia:  Patient has a history of hypercalcemia in the past and has a known thyroid nodule Now corrected.  Morbid obesity: Complicates overall prognosis and care.  Weight loss counseling provided.  Possible skin tears: Present on admission and mostly under her skin folds (breast, abdomen and groin). Wound care consulted.  Eating hospitalist discussed with wound care nurse who recommends at least a 5-day course of Diflucan to cover for fungal etiology  DVT prophylaxis: Heparin Drip Code Status: Full code Family Communication: None at bedside. Disposition Plan: Home.  Status is: Inpatient Dispo: The patient is from: Home              Anticipated d/c is to: Home              Anticipated d/c date is: > 3 days              Patient currently is not medically stable to d/c.  Consultants:  Nephrology : Dr.Kolluru.   Procedures:  ECHO : pending.  Antimicrobials:  Anti-infectives (From admission, onward)   Start     Dose/Rate Route Frequency Ordered Stop   07/25/20 1000  azithromycin (ZITHROMAX) 500 mg in sodium chloride 0.9 %  250 mL IVPB  Status:  Discontinued        500 mg 250 mL/hr over 60 Minutes Intravenous Every 24 hours 07/24/20 1108 07/24/20 1317   07/25/20 1000  remdesivir 100 mg in sodium chloride 0.9 % 100 mL IVPB       "Followed by" Linked Group Details   100 mg 200 mL/hr over 30 Minutes Intravenous Daily 07/24/20 1112 07/28/20 1544   07/24/20 1300  fluconazole (DIFLUCAN) tablet 100 mg  Status:  Discontinued        100 mg Oral Daily 07/24/20 1251 07/25/20 1522   07/24/20 1200  remdesivir 200 mg in sodium chloride 0.9% 250 mL IVPB       "Followed by" Linked Group Details   200 mg 580 mL/hr over 30 Minutes Intravenous Once 07/24/20 1112 07/24/20 1424   07/24/20 1115  cefTRIAXone (ROCEPHIN) 2 g in sodium chloride 0.9 % 100 mL IVPB  Status:  Discontinued        2 g 200 mL/hr over 30 Minutes Intravenous Every  24 hours 07/24/20 1108 07/24/20 1113   07/24/20 1000  cefTRIAXone (ROCEPHIN) 2 g in sodium chloride 0.9 % 100 mL IVPB  Status:  Discontinued        2 g 200 mL/hr over 30 Minutes Intravenous Every 24 hours 07/24/20 0947 07/28/20 1438   07/24/20 1000  azithromycin (ZITHROMAX) 500 mg in sodium chloride 0.9 % 250 mL IVPB  Status:  Discontinued        500 mg 250 mL/hr over 60 Minutes Intravenous Every 24 hours 07/24/20 0947 07/28/20 1438      Subjective: Pt seen today for f/u she is resting bed and paying her bills. Vitals are stable except for RR of 23. Pt seen by nephrology today and home bp meds held.   9/23 Pt is alert and is stable and oriented. PT eval shows pt can go home with [facility]. Pt refuse to prone. Labs shows hypokalemia and hypomagnesemia. 9/24 Pt seen today and is unhappy that she is being disturbed som any times she is fine And wants to rest.NO other issues or complaints.   Objective: Vitals:   07/28/20 1000 07/28/20 1200 07/28/20 1309 07/28/20 1524  BP: 126/69 (!) 118/96 98/77 114/77  Pulse: (!) 117 67 (!) 135 (!) 55  Resp: (!) 24 (!) 23 15 20   Temp: (!) 97.4 F  (36.3 C)  97.8 F (36.6 C) (!) 97.5 F (36.4 C)  TempSrc: Axillary  Axillary Oral  SpO2: 95% 92% 93% (!) 82%  Weight:      Height:        Intake/Output Summary (Last 24 hours) at 07/28/2020 1610 Last data filed at 07/28/2020 0900 Gross per 24 hour  Intake --  Output 800 ml  Net -800 ml   Filed Weights   07/24/20 0900  Weight: 79.4 kg   Examination: Blood pressure 114/77, pulse (!) 55, temperature (!) 97.5 F (36.4 C), temperature source Oral, resp. rate 20, height 5\' 5"  (1.651 m), weight 79.4 kg, SpO2 (!) 82 %. General exam: Appears calm and comfortable  Respiratory system: Clear to auscultation. Respiratory effort normal. Cardiovascular system: S1 & S2 heard, RRR. No JVD, murmurs, rubs, gallops or clicks. No pedal edema. Gastrointestinal system: Abdomen is nondistended, soft and nontender. No organomegaly or masses felt. Normal bowel sounds heard. Central nervous system: Alert and oriented. No focal neurological deficits. Extremities: Symmetric 5 x 5 power. Skin: No rashes, lesions or ulcers Psychiatry: Judgement and insight appear normal. Mood & affect appropriate.  Data Reviewed: I have personally reviewed following labs and imaging studies  I/O last 3 completed shifts: In: 860 [P.O.:360; IV Piggyback:500] Out: 1000 [Urine:1000] Total I/O In: -  Out: 800 [Urine:800] Lab Results  Component Value Date   CREATININE 0.84 07/28/2020   CREATININE 0.84 07/27/2020   CREATININE 0.96 07/26/2020   CBC: Recent Labs  Lab 07/24/20 0914 07/25/20 0656 07/26/20 0229 07/27/20 0548 07/28/20 0501  WBC 15.0* 12.7* 12.7* 12.8* 12.4*  NEUTROABS 13.1* 11.2* 10.9* 10.9* 10.3*  HGB 15.9* 15.4* 14.9 14.8 14.9  HCT 47.5* 45.3 43.3 44.5 44.3  MCV 82.9 81.8 81.5 84.9 85.0  PLT 436* 405* 418* 460* 151*   Basic Metabolic Panel: Recent Labs  Lab 07/24/20 0914 07/24/20 1425 07/25/20 0656 07/26/20 0229 07/27/20 0548 07/28/20 0501  NA 132*  --  140 141 138 138  K 3.0*  --  3.5  3.0* 3.1* 3.7  CL 94*  --  108 109 105 104  CO2 20*  --  23 24  22 23  GLUCOSE 152*  --  141* 163* 180* 183*  BUN 74*  --  98* 59* 29* 18  CREATININE 2.81*  --  1.36* 0.96 0.84 0.84  CALCIUM 10.4* 10.1 10.2 10.1 9.9 9.8  MG  --   --  2.4 2.1 1.8 2.1  PHOS  --   --  3.3 1.8* 1.9* 2.0*   GFR: Estimated Creatinine Clearance: 69.6 mL/min (by C-G formula based on SCr of 0.84 mg/dL). Liver Function Tests: Recent Labs  Lab 07/24/20 0914 07/25/20 0656 07/26/20 0229 07/27/20 0548 07/28/20 0501  AST 60* 48* 35 29 27  ALT 54* 45* 39 35 31  ALKPHOS 70 63 60 62 59  BILITOT 1.1 0.7 0.6 0.5 0.4  PROT 8.1 7.0 6.7 7.0 6.9  ALBUMIN 2.7* 2.3* 2.4* 2.5* 2.5*   Coagulation Profile: Recent Labs  Lab 07/24/20 0914 07/25/20 0656  INR 1.3* 1.4*   Thyroid Function Tests: Recent Labs    07/28/20 0501  TSH 3.558  FREET4 0.94   Anemia Panel: Recent Labs    07/27/20 0548 07/28/20 0501  FERRITIN 518* 479*   Sepsis Labs: Recent Labs  Lab 07/24/20 0914 07/24/20 1145 07/24/20 1425 07/25/20 0656 07/25/20 1521 07/28/20 0501  PROCALCITON  --   --  1.35 1.39  --  0.10  LATICACIDVEN 3.9* 2.5*  --   --  2.6*  --     Recent Results (from the past 240 hour(s))  Urine culture     Status: Abnormal   Collection Time: 07/24/20  9:12 AM   Specimen: Urine, Random  Result Value Ref Range Status   Specimen Description   Final    URINE, RANDOM Performed at [facility], [address], [place], [place] [address]    Special Requests   Final    NONE Performed at [facility], [address] [place], [place] [address]    Culture (A)  Final    <10,000 COLONIES/mL >=100,000 COLONIES/mL Performed at [facility], [place] [address], [place], [place] [address]    Report Status 07/25/2020 FINAL  Final  Blood culture (routine single)     Status: None (Preliminary result)   Collection Time: 07/24/20  9:14 AM   Specimen: BLOOD LEFT ARM  Result Value Ref Range Status    Specimen Description BLOOD LEFT ARM  Final   Special Requests   Final    BOTTLES DRAWN AEROBIC AND ANAEROBIC Blood Culture adequate volume   Culture   Final    NO GROWTH 4 DAYS Performed at [facility], [address], [place], [place] [address]    Report Status PENDING  Incomplete  SARS Coronavirus 2 by RT PCR (hospital order, performed in [place] hospital lab) Nasopharyngeal Nasopharyngeal Swab     Status: Abnormal   Collection Time: 07/24/20  9:16 AM   Specimen: Nasopharyngeal Swab  Result Value Ref Range Status   SARS Coronavirus 2 POSITIVE (A) NEGATIVE Final    Comment: RESULT CALLED TO, READ BACK BY AND VERIFIED WITH:  SAMANTHA HAMILTON AT 0102 07/24/20 SDR (NOTE) SARS-CoV-2 target nucleic acids are DETECTED  SARS-CoV-2 RNA is generally detectable in upper respiratory specimens  during the acute phase of infection.  Positive results are indicative  of the presence of the identified virus, but do not rule out bacterial infection or co-infection with other pathogens not detected by the test.  Clinical correlation with patient history and  other diagnostic information is necessary to determine patient infection status.  The expected result is negative.  Fact Sheet for Patients:   StrictlyIdeas.no   Fact Sheet for Healthcare Providers:   BankingDealers.co.za    This test is not yet approved or cleared by the [place] FDA and  has been authorized for detection and/or diagnosis of SARS-CoV-2 by FDA under an Emergency Use Authorization (EUA).  This EUA will remain in effect (meaning thi s test can be used) for the duration of  the COVID-19 declaration under Section 564(b)(1) of the Act, 21 U.S.C. section 360-bbb-3(b)(1), unless the authorization is terminated or revoked sooner.  Performed at [facility], [address], [place], [place] [address]     Radiology Studies: CT ANGIO CHEST PE W OR WO  CONTRAST  Result Date: 07/28/2020 CLINICAL DATA:  Respiratory failure. COVID positive. Rule out pulmonary embolism. EXAM: CT ANGIOGRAPHY CHEST WITH CONTRAST TECHNIQUE: Multidetector CT imaging of the chest was performed using the standard protocol during bolus administration of intravenous contrast. Multiplanar CT image reconstructions and MIPs were obtained to evaluate the vascular anatomy. CONTRAST:  167mL OMNIPAQUE IOHEXOL 350 MG/ML SOLN COMPARISON:  Chest 07/24/2020 FINDINGS: Cardiovascular: Excellent pulmonary artery opacification. Negative for pulmonary embolism. Moderate pulmonary artery enlargement compatible with pulmonary artery hypertension. Main pulmonary artery 33 mm in diameter. Cardiac enlargement. No pericardial effusion. Thoracic aorta normal in caliber. No significant opacification of the aorta. Mediastinum/Nodes: Negative for mediastinal mass or adenopathy. Lungs/Pleura: Severe diffuse bilateral airspace disease most prominent in the bases. No pleural effusion. Upper Abdomen: Negative Musculoskeletal: No acute skeletal abnormality. Review of the MIP images confirms the above findings. IMPRESSION: Negative for pulmonary embolism.  Pulmonary artery hypertension. Severe bilateral airspace disease most prominent the bases compatible with COVID pneumonia. Electronically Signed   By: Franchot Gallo M.D.   On: 07/28/2020 12:21   Scheduled Meds:  albuterol  2 puff Inhalation Q6H   vitamin C  500 mg Oral Daily   aspirin EC  81 mg Oral Daily   baricitinib  4 mg Oral Daily   cholecalciferol  2,000 Units Oral Daily   Gerhardt's butt cream   Topical TID   metoprolol succinate  100 mg Oral Daily   multivitamin with minerals  1 tablet Oral Daily   nystatin   Topical TID   phosphorus  500 mg Oral Q4H   pneumococcal 23 valent vaccine  0.5 mL Intramuscular Tomorrow-1000   predniSONE  50 mg Oral Daily   zinc sulfate  220 mg Oral Daily   Continuous Infusions:  amiodarone 30 mg/hr  (07/28/20 1310)   diltiazem (CARDIZEM) infusion 15 mg/hr (07/28/20 1049)   heparin 1,000 Units/hr (07/28/20 1508)    LOS: 4 days   Para Skeans, MD [facility] Pager [phone] If 7PM-7AM, please contact night-coverage [email or web address] Password [email or web address] 07/28/2020, 4:10 PM

## 2020-07-28 NOTE — Consult Note (Addendum)
[place] for Heparin Infusion Indication: atrial fibrillation  Allergies  Allergen Reactions  . Codeine     Patient Measurements: Height: 5\' 5"  (165.1 cm) Weight: 79.4 kg (175 lb) IBW/kg (Calculated) : 57 Heparin Dosing Weight: 73.7 kg  Vital Signs: Temp: 97.8 F (36.6 C) (09/24 1309) Temp Source: Axillary (09/24 1309) BP: 98/77 (09/24 1309) Pulse Rate: 135 (09/24 1309)  Labs: Recent Labs    07/26/20 0229 07/26/20 0229 07/26/20 0910 07/27/20 0548 07/28/20 0501  HGB 14.9   < >  --  14.8 14.9  HCT 43.3  --   --  44.5 44.3  PLT 418*  --   --  460* 436*  HEPARINUNFRC 0.47   < > 0.64 0.51 0.82*  CREATININE 0.96  --   --  0.84 0.84   < > = values in this interval not displayed.    Estimated Creatinine Clearance: 69.6 mL/min (by C-G formula based on SCr of 0.84 mg/dL).   Medical History: Past Medical History:  Diagnosis Date  . Allergy   . Elevated transaminase level   . Hyperlipidemia   . Hypertension   . Obesity     Medications:  No anticoagulation prior to admission per chart review  Assessment: Patient is a 64 y/o F with medical history including hypertension, hypothyroidism who is admitted with acute respiratory failure secondary to pneumonia. Admission complicated by atrial fibrillation with RVR. Pharmacy has been consulted to initiate heparin infusion for stroke prophylaxis in the setting of atrial fibrillation.   Baseline aPTT < 24, INR 1.4. H&H with mildly elevated H&H, platelets.   Hgb 14.9>14.8>14.9 Plt 680>881>103 9/24 CT chest: negative for pulmonary embolism  9/21 1831 HL 0.11; 2500 unit bolus, rate increased to 1300 units/hr 9/22 0229 HL 0.47; rate continued 9/22 0910 HL 0.64; rate continued 9/23 0548 HL 0.51; rate continued   9/24 0501 HL 0.82; rate decreased to 1150 units/hr 9/24 1359 HL 0.84 - verified with lab that level was drawn appropriately  Goal of Therapy:  Heparin level 0.3-0.7  units/ml Monitor platelets by anticoagulation protocol: Yes   Plan:  Heparin level is supratherapeutic at 0.84. Will decrease rate to 1000 units/hr. Recheck heparin level in 6 hours and monitor CBC with AM labs.    Sherilyn Banker, PharmD Pharmacy Resident  07/28/2020 1:53 PM

## 2020-07-28 NOTE — Progress Notes (Signed)
Cross Cover Note Patient continues in a fib with RVR rates 150-180. No response to additional metoprolol given, remains on amio at 0.5 mg/h and cardizem 15 mg/h and was given dig load today  ekg with QTc 478  Additional amio bolus ordered. Continue to monitor on tele

## 2020-07-28 NOTE — Progress Notes (Signed)
PT Cancellation Note  Patient Details Name: Courtney Anderson MRN: [id number] DOB: 07/14/1956   Cancelled Treatment:    Reason Eval/Treat Not Completed: Other (comment) After chart review, pt's HR noted to be at 135 at last reading while on 2 cardiac drips. Per RN, pt's HR has been fluctuating between 130-160s and although the pt is asymptomatic, they are having a hard time getting her HR under control and recommending hold at this time. Will attempt treatment another date/time when appropriate and pt medically stable to participate in exertional activity.    Vale Haven 07/28/2020, 2:39 PM

## 2020-07-28 NOTE — Progress Notes (Addendum)
BP 98/77 and HR 140-160s patient remains asymptomatic. Per Dr. Posey Pronto, keep amio and cardizem running as is. Dr. Humphrey Rolls to come see patient. Will monitor.   1338: Dr. Humphrey Rolls rounded. Per MD giving 0.5 mg IV digoxin now, daily 0.25 mg IV digoxin starting tomorrow with a digoxin level to be checked Sunday. MD states he is okay to let both amio and cardizem drips continue as long as SBP >80 mmHg and patient asymptomatic.

## 2020-07-28 NOTE — Consult Note (Signed)
[facility] for Heparin Infusion Indication: atrial fibrillation  Allergies  Allergen Reactions  . Codeine     Patient Measurements: Height: 5\' 5"  (165.1 cm) Weight: 79.4 kg (175 lb) IBW/kg (Calculated) : 57 Heparin Dosing Weight: 73.7 kg  Vital Signs: Temp: 97.6 F (36.4 C) (09/24 2100) Temp Source: Axillary (09/24 2100) BP: 115/93 (09/24 2300) Pulse Rate: 68 (09/24 2300)  Labs: Recent Labs    07/26/20 0229 07/26/20 0910 07/27/20 0548 07/27/20 0548 07/28/20 0501 07/28/20 1359 07/28/20 2228  HGB 14.9  --  14.8  --  14.9  --   --   HCT 43.3  --  44.5  --  44.3  --   --   PLT 418*  --  460*  --  436*  --   --   HEPARINUNFRC 0.47   < > 0.51   < > 0.82* 0.84* 0.58  CREATININE 0.96  --  0.84  --  0.84  --   --    < > = values in this interval not displayed.    Estimated Creatinine Clearance: 69.6 mL/min (by C-G formula based on SCr of 0.84 mg/dL).   Medical History: Past Medical History:  Diagnosis Date  . Allergy   . Elevated transaminase level   . Hyperlipidemia   . Hypertension   . Obesity     Medications:  No anticoagulation prior to admission per chart review  Assessment: Patient is a 64 y/o F with medical history including hypertension, hypothyroidism who is admitted with acute respiratory failure secondary to pneumonia. Admission complicated by atrial fibrillation with RVR. Pharmacy has been consulted to initiate heparin infusion for stroke prophylaxis in the setting of atrial fibrillation.   Baseline aPTT < 24, INR 1.4. H&H with mildly elevated H&H, platelets.   Hgb 14.9>14.8>14.9 Plt 742>595>638 9/24 CT chest: negative for pulmonary embolism  9/21 1831 HL 0.11; 2500 unit bolus, rate increased to 1300 units/hr 9/22 0229 HL 0.47; rate continued 9/22 0910 HL 0.64; rate continued 9/23 0548 HL 0.51; rate continued   9/24 0501 HL 0.82; rate decreased to 1150 units/hr 9/24 1359 HL 0.84 - verified with lab that  level was drawn appropriately 9/24 2228 HL 0.58   Goal of Therapy:  Heparin level 0.3-0.7 units/ml Monitor platelets by anticoagulation protocol: Yes   Plan:  Heparin level is supratherapeutic at 0.84. Will decrease rate to 1000 units/hr. Recheck heparin level in 6 hours and monitor CBC with AM labs.   9/24:  HL @ 2228 = 0.58 Will continue pt on current rate and draw confirmation level on 9/25 with AM labs.    Orene Desanctis, PharmD Pharmacy Resident  07/28/2020 11:15 PM

## 2020-07-28 NOTE — Telephone Encounter (Signed)
Needs an appointment. Will get him enough medicine to make it to appointment when it's booked.   

## 2020-07-28 NOTE — Progress Notes (Signed)
PHARMACY CONSULT NOTE - FOLLOW UP  Pharmacy Consult for Electrolyte Monitoring and Replacement   Recent Labs: Potassium (mmol/L)  Date Value  07/28/2020 3.7   Magnesium (mg/dL)  Date Value  07/28/2020 2.1   Calcium (mg/dL)  Date Value  07/28/2020 9.8   Calcium, Total (PTH) (mg/dL)  Date Value  07/24/2020 10.1   Albumin (g/dL)  Date Value  07/28/2020 2.5 (L)  10/22/2019 4.2   Phosphorus (mg/dL)  Date Value  07/28/2020 2.0 (L)   Sodium (mmol/L)  Date Value  07/28/2020 138  10/22/2019 139   Assessment: 64 yo female with PMH for HTN and hypotension who was admitted with sepsis, acute respiratory failure, and new onset A. Fib. Patient had hypercalcemia consistent with primary hyperparathyroidism with PTH 118>102 on this admission. Patient is on amiodarone drip and daily digoxin. Pharmacy consulted for electrolyte monitoring and replacement.   Goal of Therapy:  Electrolytes WNL K> 4 Mg >2   Plan:  Phos 2.0 - Will order Kphos 500mg  q4h x4 doses (~8.83mEq of K) K 3.7 - Will order KCL 39mEq PO x 1 dose.  Mg 2.1 at goal, no replacement needed at this time  Plan to F/U with AM labs and continue to replace as needed.   Sherilyn Banker, PharmD Pharmacy Resident  07/28/2020 8:44 AM

## 2020-07-29 ENCOUNTER — Inpatient Hospital Stay: Payer: Medicare Other

## 2020-07-29 DIAGNOSIS — A419 Sepsis, unspecified organism: Secondary | ICD-10-CM | POA: Diagnosis not present

## 2020-07-29 DIAGNOSIS — I4891 Unspecified atrial fibrillation: Secondary | ICD-10-CM | POA: Diagnosis not present

## 2020-07-29 DIAGNOSIS — R6520 Severe sepsis without septic shock: Secondary | ICD-10-CM | POA: Diagnosis not present

## 2020-07-29 DIAGNOSIS — N179 Acute kidney failure, unspecified: Secondary | ICD-10-CM | POA: Diagnosis not present

## 2020-07-29 LAB — MAGNESIUM: Magnesium: 1.7 mg/dL (ref 1.7–2.4)

## 2020-07-29 LAB — CULTURE, BLOOD (SINGLE)
Culture: NO GROWTH
Special Requests: ADEQUATE

## 2020-07-29 LAB — BLOOD GAS, ARTERIAL
Acid-Base Excess: 5.9 mmol/L — ABNORMAL HIGH (ref 0.0–2.0)
Bicarbonate: 26.1 mmol/L (ref 20.0–28.0)
O2 Saturation: 87.7 %
Patient temperature: 37
pCO2 arterial: 26 mmHg — ABNORMAL LOW (ref 32.0–48.0)
pH, Arterial: 7.61 (ref 7.350–7.450)
pO2, Arterial: 43 mmHg — ABNORMAL LOW (ref 83.0–108.0)

## 2020-07-29 LAB — CBC WITH DIFFERENTIAL/PLATELET
Abs Immature Granulocytes: 0.52 10*3/uL — ABNORMAL HIGH (ref 0.00–0.07)
Basophils Absolute: 0.1 10*3/uL (ref 0.0–0.1)
Basophils Relative: 0 %
Eosinophils Absolute: 0 10*3/uL (ref 0.0–0.5)
Eosinophils Relative: 0 %
HCT: 46.6 % — ABNORMAL HIGH (ref 36.0–46.0)
Hemoglobin: 15.4 g/dL — ABNORMAL HIGH (ref 12.0–15.0)
Immature Granulocytes: 3 %
Lymphocytes Relative: 6 %
Lymphs Abs: 1.3 10*3/uL (ref 0.7–4.0)
MCH: 28.2 pg (ref 26.0–34.0)
MCHC: 33 g/dL (ref 30.0–36.0)
MCV: 85.2 fL (ref 80.0–100.0)
Monocytes Absolute: 0.4 10*3/uL (ref 0.1–1.0)
Monocytes Relative: 2 %
Neutro Abs: 18.4 10*3/uL — ABNORMAL HIGH (ref 1.7–7.7)
Neutrophils Relative %: 89 %
Platelets: 429 10*3/uL — ABNORMAL HIGH (ref 150–400)
RBC: 5.47 MIL/uL — ABNORMAL HIGH (ref 3.87–5.11)
RDW: 15.3 % (ref 11.5–15.5)
WBC: 20.7 10*3/uL — ABNORMAL HIGH (ref 4.0–10.5)
nRBC: 0.3 % — ABNORMAL HIGH (ref 0.0–0.2)

## 2020-07-29 LAB — COMPREHENSIVE METABOLIC PANEL
ALT: 27 U/L (ref 0–44)
AST: 34 U/L (ref 15–41)
Albumin: 2.6 g/dL — ABNORMAL LOW (ref 3.5–5.0)
Alkaline Phosphatase: 73 U/L (ref 38–126)
Anion gap: 10 (ref 5–15)
BUN: 16 mg/dL (ref 8–23)
CO2: 26 mmol/L (ref 22–32)
Calcium: 9.8 mg/dL (ref 8.9–10.3)
Chloride: 104 mmol/L (ref 98–111)
Creatinine, Ser: 0.88 mg/dL (ref 0.44–1.00)
GFR calc Af Amer: >60 mL/min (ref >60)
GFR calc non Af Amer: >60 mL/min (ref >60)
Glucose, Bld: 132 mg/dL — ABNORMAL HIGH (ref 70–99)
Potassium: 3.7 mmol/L (ref 3.5–5.1)
Sodium: 140 mmol/L (ref 135–145)
Total Bilirubin: 0.8 mg/dL (ref 0.3–1.2)
Total Protein: 7 g/dL (ref 6.5–8.1)

## 2020-07-29 LAB — C-REACTIVE PROTEIN: CRP: 6.9 mg/dL — ABNORMAL HIGH (ref <1.0)

## 2020-07-29 LAB — FIBRIN DERIVATIVES D-DIMER (ARMC ONLY): Fibrin derivatives D-dimer (ARMC): 1182.22 ng/mL (FEU) — ABNORMAL HIGH (ref 0.00–499.00)

## 2020-07-29 LAB — 25-HYDROXY VITAMIN D LCMS D2+D3
25-Hydroxy, Vitamin D-2: <1 ng/mL
25-Hydroxy, Vitamin D-3: 14 ng/mL
25-Hydroxy, Vitamin D: 14 ng/mL — ABNORMAL LOW

## 2020-07-29 LAB — FERRITIN: Ferritin: 566 ng/mL — ABNORMAL HIGH (ref 11–307)

## 2020-07-29 LAB — HEPARIN LEVEL (UNFRACTIONATED): Heparin Unfractionated: 0.43 IU/mL (ref 0.30–0.70)

## 2020-07-29 LAB — PHOSPHORUS: Phosphorus: 2.5 mg/dL (ref 2.5–4.6)

## 2020-07-29 MED ORDER — DILTIAZEM HCL 25 MG/5ML IV SOLN
INTRAVENOUS | Status: AC
  Administered 2020-07-29: 25 mg via INTRAVENOUS
  Filled 2020-07-29: qty 5

## 2020-07-29 MED ORDER — MAGNESIUM SULFATE 2 GM/50ML IV SOLN
2.0000 g | Freq: Once | INTRAVENOUS | Status: AC
  Administered 2020-07-29: 2 g via INTRAVENOUS
  Filled 2020-07-29: qty 50

## 2020-07-29 MED ORDER — POTASSIUM CHLORIDE CRYS ER 20 MEQ PO TBCR
40.0000 meq | EXTENDED_RELEASE_TABLET | Freq: Once | ORAL | Status: AC
  Administered 2020-07-29: 40 meq via ORAL
  Filled 2020-07-29: qty 2

## 2020-07-29 MED ORDER — FUROSEMIDE 10 MG/ML IJ SOLN
40.0000 mg | Freq: Once | INTRAMUSCULAR | Status: AC
  Administered 2020-07-29: 40 mg via INTRAVENOUS
  Filled 2020-07-29: qty 4

## 2020-07-29 MED ORDER — DIGOXIN 0.25 MG/ML IJ SOLN
0.2500 mg | Freq: Every day | INTRAMUSCULAR | Status: DC
  Administered 2020-07-29 – 2020-08-01 (×4): 0.25 mg via INTRAVENOUS
  Filled 2020-07-29 (×4): qty 2

## 2020-07-29 MED ORDER — DILTIAZEM HCL 25 MG/5ML IV SOLN: 25.0000 mg | Freq: Once | INTRAVENOUS | Status: AC

## 2020-07-29 MED ORDER — MAGNESIUM SULFATE IN D5W 1-5 GM/100ML-% IV SOLN
1.0000 g | Freq: Once | INTRAVENOUS | Status: DC
  Filled 2020-07-29: qty 100

## 2020-07-29 MED ORDER — K PHOS MONO-SOD PHOS DI & MONO 155-852-130 MG PO TABS
500.0000 mg | ORAL_TABLET | Freq: Once | ORAL | Status: AC
  Administered 2020-07-29: 500 mg via ORAL
  Filled 2020-07-29: qty 2

## 2020-07-29 NOTE — Progress Notes (Signed)
SUBJECTIVE: Patient is comfortable   Vitals:   07/29/20 0610 07/29/20 0618 07/29/20 0700 07/29/20 0800  BP: 116/80 (!) 116/96 116/80 113/69  Pulse:  (!) 157 (!) 129 (!) 43  Resp:   (!) 30 (!) 29  Temp:    97.6 F (36.4 C)  TempSrc:      SpO2:   (!) 87% (!) 87%  Weight:      Height:        Intake/Output Summary (Last 24 hours) at 07/29/2020 1422 Last data filed at 07/29/2020 9381 Gross per 24 hour  Intake --  Output 1400 ml  Net -1400 ml    LABS: Basic Metabolic Panel: Recent Labs    07/28/20 0501 07/29/20 0620  NA 138 140  K 3.7 3.7  CL 104 104  CO2 23 26  GLUCOSE 183* 132*  BUN 18 16  CREATININE 0.84 0.88  CALCIUM 9.8 9.8  MG 2.1 1.7  PHOS 2.0* 2.5   Liver Function Tests: Recent Labs    07/28/20 0501 07/29/20 0620  AST 27 34  ALT 31 27  ALKPHOS 59 73  BILITOT 0.4 0.8  PROT 6.9 7.0  ALBUMIN 2.5* 2.6*   No results for input(s): LIPASE, AMYLASE in the last 72 hours. CBC: Recent Labs    07/28/20 0501 07/29/20 0620  WBC 12.4* 20.7*  NEUTROABS 10.3* 18.4*  HGB 14.9 15.4*  HCT 44.3 46.6*  MCV 85.0 85.2  PLT 436* 429*   Cardiac Enzymes: No results for input(s): CKTOTAL, CKMB, CKMBINDEX, TROPONINI in the last 72 hours. BNP: Invalid input(s): POCBNP D-Dimer: No results for input(s): DDIMER in the last 72 hours. Hemoglobin A1C: No results for input(s): HGBA1C in the last 72 hours. Fasting Lipid Panel: No results for input(s): CHOL, HDL, LDLCALC, TRIG, CHOLHDL, LDLDIRECT in the last 72 hours. Thyroid Function Tests: Recent Labs    07/28/20 0501  TSH 3.558   Anemia Panel: Recent Labs    07/29/20 0620  FERRITIN 566*     PHYSICAL EXAM General: Well developed, well nourished, in no acute distress HEENT:  Normocephalic and atramatic Neck:  No JVD.  Lungs: Clear bilaterally to auscultation and percussion. Heart: HRRR . Normal S1 and S2 without gallops or murmurs.  Abdomen: Bowel sounds are positive, abdomen soft and non-tender  Msk:  Back  normal, normal gait. Normal strength and tone for age. Extremities: No clubbing, cyanosis or edema.   Neuro: Alert and oriented X 3. Psych:  Good affect, responds appropriately  TELEMETRY: Remains in A. fib between 150 to 160 bpm  ASSESSMENT AND PLAN: A. fib with rapid ventricular response rate on digoxin amiodarone Cardizem and metoprolol with Covid and sepsis due to catecholamine release it will be very difficult to control the heart rate at this time.  Since patient is asymptomatic we will continue this medication.  Principal Problem:   Sepsis ([place]) Active Problems:   Hypertension   Morbid obesity ([place])   Hypercalcemia   Hyperthyroidism   AKI (acute kidney injury) ([place])   COVID-19 virus infection   Atrial fibrillation with RVR ([place])   Hypokalemia    Jamille Yoshino A, MD, [facility] 07/29/2020 2:22 PM

## 2020-07-29 NOTE — Progress Notes (Signed)
PHARMACY CONSULT NOTE - FOLLOW UP  Pharmacy Consult for Electrolyte Monitoring and Replacement   Recent Labs: Potassium (mmol/L)  Date Value  07/29/2020 3.7   Magnesium (mg/dL)  Date Value  07/29/2020 1.7   Calcium (mg/dL)  Date Value  07/29/2020 9.8   Calcium, Total (PTH) (mg/dL)  Date Value  07/24/2020 10.1   Albumin (g/dL)  Date Value  07/29/2020 2.6 (L)  10/22/2019 4.2   Phosphorus (mg/dL)  Date Value  07/29/2020 2.5   Sodium (mmol/L)  Date Value  07/29/2020 140  10/22/2019 139   Assessment: 64 yo female with PMH for HTN and hypotension who was admitted with sepsis, acute respiratory failure, and new onset A. Fib. Patient had hypercalcemia consistent with primary hyperparathyroidism with PTH 118>102 on this admission. Patient is on amiodarone drip and daily digoxin. Pharmacy consulted for electrolyte monitoring and replacement.   Goal of Therapy:  Electrolytes WNL K> 4 Mg >2   Plan:  K 3.7 - Will order KCL 36mEq PO x 1 dose.  Mg 2 g IV x 1.  Plan to F/U with AM labs and continue to replace as needed.   Oswald Hillock, PharmD 07/29/2020 8:52 AM

## 2020-07-29 NOTE — Progress Notes (Signed)
Cross Cover A fib with RVR ongoing. Additional amio bolus and cardizem bolus given during shift without any effect.  Chest xray FINDINGS: Transverse heart size stable. Mediastinal silhouette within normal limits.  Lungs mildly hypoinflated. Severe diffuse bilateral airspace disease, right greater than left, progressed and worsened as compared to prior radiograph from 07/24/2020. Scattered air bronchograms noted at the lung bases bilaterally. Underlying pulmonary vascular congestion without overt pulmonary edema. No visible pleural effusion. No pneumothorax.  Osseous structures unchanged.  IMPRESSION: Severe diffuse bilateral airspace disease, right greater than left, progressed and worsened as compared to previous, concerning for worsened multifocal pneumonia.  procalcitonin now normal Concerning pulmonary vascular congestion in setting of covid viral illness and progression of bilateral ground glass opacities.  - lasix given  Continue heparin  Continue supportive therapies for viral pneumonia

## 2020-07-29 NOTE — Progress Notes (Signed)
Cardizem 25mg  given IV per NP orders.  Also stat CXR done.  Pt remains in afib rvr now at 167.  Pt remains asymptomatic.

## 2020-07-29 NOTE — Progress Notes (Signed)
   07/29/20 0800  Assess: MEWS Score  Temp 97.6 F (36.4 C)  BP 113/69  Pulse Rate (!) 43  ECG Heart Rate (!) 172  Resp (!) 29  SpO2 (!) 87 %  Assess: MEWS Score  MEWS Temp 0  MEWS Systolic 0  MEWS Pulse 3  MEWS RR 2  MEWS LOC 0  MEWS Score 5  MEWS Score Color Red  Assess: if the MEWS score is Yellow or Red  Were vital signs taken at a resting state? Yes  Focused Assessment No change from prior assessment  Early Detection of Sepsis Score *See Row Information* Low  MEWS guidelines implemented *See Row Information* No, previously red, continue vital signs every 4 hours  Treat  MEWS Interventions Administered scheduled meds/treatments;Administered prn meds/treatments;Other (Comment) (RT to initiate heated HFNC)

## 2020-07-29 NOTE — Progress Notes (Signed)
PROGRESS NOTE    [name]  DQQ:[id number] DOB: 1956/03/23 DOA: 07/24/2020 PCP: Guadalupe Maple, MD   Brief Narrative: Courtney Anderson a 64 y.o.femalewith medical history significant formorbid obesity, hypertension, hypothyroidism, dyslipidemia who was brought into the ER by EMS asa "CODE SEPSIS".Patient states that she has not felt well in over a week and complains of a cough, congestion and generalized weakness. She also had intermittent fever and chills. She states that she is usually able to care for herself but over the last 1 week has been unable to because she has felt unwell. Per EMS patient was noted to be tachycardic in the field but they were unable to get an adequate pulse ox reading and so she was placed on a nonrebreather mask. She received 500 cc of fluid in route to the hospital. Upon arrival to the ER she had room air pulse oximetry of 69% and so she remained on the nonrebreather mask with improvement in her pulse oximetry to the low 90s. She denies having any nausea, no vomiting, no changes in her bowel habits, no abdominal pain, no chest pain, no dizziness or lightheadedness. She denies having any falls. Patient is also noted to have multiple areas of redness and excoriation under both breasts, abdominal creases and in between her upper thighs.Redness noted to buttocks but no skin break at this time. Labs on admission show sodium 132, potassium 3.0, chloride 94, bicarb 20, BUN 74, creatinine 2.81, calcium 10.4, AST 60, ALT 54, total protein 8.1, BNP111,lactic acid 3.9, white count 15 with a left shift, hemoglobin 15.9, hematocrit 47.5, MCV 82.9, RDW 14.6, platelet count 436. Patient's COVID-19 PCR test is positive. Chest x-ray reviewed by me shows air bronchograms in the right lung base. Twelve-lead EKG shows rapid A. fib with multiple PVCs and incomplete right bundle branch block and left anterior fascicular block.   Assessment & Plan:   Principal  Problem:   Sepsis ([place]) Active Problems:   Hypertension   Morbid obesity ([place])   Hypercalcemia   Hyperthyroidism   AKI (acute kidney injury) ([place])   COVID-19 virus infection   Atrial fibrillation with RVR ([facility])   Hypokalemia  Acute hypoxic respiratory failure and severe sepsis secondary to COVID-19 pneumonia: Patient meets criteria for severe sepsis based on tachycardia, tachypnea, leukocytosis, acute kidney injury, hypoxia and lactic acid of 3.9.  Lactic acid has improved to 2.5 today. Leukocytosis improved as well.  Patient remains afebrile.  Blood pressure fairly stable. Currently on 30 L high flow nasal oxygen.  Patient feels well.  Able to speak in full sentences.  We will continue Solu-Medrol, remdesivir, zinc, vitamin C, antitussives, bronchodilators.  Patient was encouraged to prone, out of bed to chair, to use incentive spirometry and flutter valve. Actemra/Baricitinib  off label use - patient was told that if COVID-19 pneumonitis gets worse we might potentially use Actemra/ barititinib off label, patient denies any known history of active diverticulitis, tuberculosis or hepatitis, understands the risks and benefits and wants to proceed with Actemra/barititinib treatment.  Will start that.  Will start on 2 mg based on her GFR which is 43 at this point in time.  Once her GFR is greater than 60, this can be increased to 4 mg. Elevated pro calcitonin, suspecting for possible bacterial infection.  Continue Rocephin and Zithromax.  Follow cultures. 9/23 Pt is stable. SpO2: (!) 87 % O2 Flow Rate (L/min): 12 L/min COVID-19 Labs  Recent Labs    07/27/20 0548 07/28/20 0501 07/29/20 1941  FERRITIN 518* 479* 566*  CRP 5.5* 3.8* 6.9*    Lab Results  Component Value Date   SARSCOV2NAA POSITIVE (A) 07/24/2020   9/24 Pt is stable and alert and her heart rate is high and cardiology is managing pt and is stated on digoxin and amiodarone. Pt is asymptomatic and Denies any complaints or pain  or chest pain or palpitation.   Acute kidney injury: Likely due to ATN due to sepsis.  Creatinine upon presentation at 2.81 and improved to 1.36.  Nephrology on board.  Management per them. Lab Results  Component Value Date   CREATININE 0.88 07/29/2020   CREATININE 0.84 07/28/2020   CREATININE 0.84 07/27/2020  Resolved.   9/25 Pt seen today and for f./u and her hart rate has been resistant and electrolytes has been corrected and has been onoxygen and cardiology has New onset atrial fibrillation with rapid ventricular rate: Rates around 1 10-1 20. Cardiology on board.  She has been started on amiodarone and IV heparin drip.  Echo is pending. Cardiology managing.  Appreciate their help and defer to them. Diltiazem was d/c but HR was still in a.fib rvr and  Therefore has been restarted.  Pt has been in sinus tachycardia and has been started on digoxin and amiodarone.   Hypokalemia: Resolved but had borderline.  Will give her 1 more dose orally today. Pharmacy consulted to replacement.   Hypercalcemia:  Patient has a history of hypercalcemia in the past and has a known thyroid nodule Now corrected.  Morbid obesity: Complicates overall prognosis and care.  Weight loss counseling provided.  Possible skin tears: Present on admission and mostly under her skin folds (breast, abdomen and groin). Wound care consulted.  Eating hospitalist discussed with wound care nurse who recommends at least a 5-day course of Diflucan to cover for fungal etiology.   DVT prophylaxis: Heparin Drip Code Status: Full code Family Communication: None at bedside. Disposition Plan: Home.  Status is: Inpatient Dispo: The patient is from: Home              Anticipated d/c is to: Home              Anticipated d/c date is: > 3 days              Patient currently is not medically stable to d/c.  Consultants:  Nephrology : Dr.Kolluru.   Procedures:  ECHO : pending.  Antimicrobials:  Anti-infectives (From  admission, onward)   Start     Dose/Rate Route Frequency Ordered Stop   07/25/20 1000  azithromycin (ZITHROMAX) 500 mg in sodium chloride 0.9 % 250 mL IVPB  Status:  Discontinued        500 mg 250 mL/hr over 60 Minutes Intravenous Every 24 hours 07/24/20 1108 07/24/20 1317   07/25/20 1000  remdesivir 100 mg in sodium chloride 0.9 % 100 mL IVPB       "Followed by" Linked Group Details   100 mg 200 mL/hr over 30 Minutes Intravenous Daily 07/24/20 1112 07/28/20 1544   07/24/20 1300  fluconazole (DIFLUCAN) tablet 100 mg  Status:  Discontinued        100 mg Oral Daily 07/24/20 1251 07/25/20 1522   07/24/20 1200  remdesivir 200 mg in sodium chloride 0.9% 250 mL IVPB       "Followed by" Linked Group Details   200 mg 580 mL/hr over 30 Minutes Intravenous Once 07/24/20 1112 07/24/20 1424   07/24/20 1115  cefTRIAXone (ROCEPHIN) 2 g in sodium  chloride 0.9 % 100 mL IVPB  Status:  Discontinued        2 g 200 mL/hr over 30 Minutes Intravenous Every 24 hours 07/24/20 1108 07/24/20 1113   07/24/20 1000  cefTRIAXone (ROCEPHIN) 2 g in sodium chloride 0.9 % 100 mL IVPB  Status:  Discontinued        2 g 200 mL/hr over 30 Minutes Intravenous Every 24 hours 07/24/20 0947 07/28/20 1438   07/24/20 1000  azithromycin (ZITHROMAX) 500 mg in sodium chloride 0.9 % 250 mL IVPB  Status:  Discontinued        500 mg 250 mL/hr over 60 Minutes Intravenous Every 24 hours 07/24/20 0947 07/28/20 1438      Subjective: Pt seen today for f/u she is resting bed and paying her bills. Vitals are stable except for RR of 23. Pt seen by nephrology today and home bp meds held.   9/23 Pt is alert and is stable and oriented. PT eval shows pt can go home with [facility]. Pt refuse to prone. Labs shows hypokalemia and hypomagnesemia. 9/24 Pt seen today and is unhappy that she is being disturbed som any times she is fine And wants to rest.NO other issues or complaints.   Objective: Vitals:   07/29/20 0610 07/29/20 0618  07/29/20 0700 07/29/20 0800  BP: 116/80 (!) 116/96 116/80 113/69  Pulse:  (!) 157 (!) 129 (!) 43  Resp:   (!) 30 (!) 29  Temp:    97.6 F (36.4 C)  TempSrc:      SpO2:   (!) 87% (!) 87%  Weight:      Height:        Intake/Output Summary (Last 24 hours) at 07/29/2020 1551 Last data filed at 07/29/2020 0821 Gross per 24 hour  Intake --  Output 1400 ml  Net -1400 ml   Filed Weights   07/24/20 0900  Weight: 79.4 kg   Examination: Blood pressure 113/69, pulse (!) 43, temperature 97.6 F (36.4 C), resp. rate (!) 29, height 5\' 5"  (1.651 m), weight 79.4 kg, SpO2 (!) 87 %. General exam: Appears calm and comfortable  Respiratory system: Clear to auscultation. Respiratory effort normal. Cardiovascular system: S1 & S2 heard, RRR. No JVD, murmurs, rubs, gallops or clicks. No pedal edema. Gastrointestinal system: Abdomen is nondistended, soft and nontender. No organomegaly or masses felt. Normal bowel sounds heard. Central nervous system: Alert and oriented. No focal neurological deficits. Extremities: Symmetric 5 x 5 power. Skin: No rashes, lesions or ulcers Psychiatry: Judgement and insight appear normal. Mood & affect appropriate.  Data Reviewed: I have personally reviewed following labs and imaging studies  I/O last 3 completed shifts: In: -  Out: 1500 [Urine:1500] Total I/O In: -  Out: 700 [Urine:700] Lab Results  Component Value Date   CREATININE 0.88 07/29/2020   CREATININE 0.84 07/28/2020   CREATININE 0.84 07/27/2020   CBC: Recent Labs  Lab 07/25/20 0656 07/26/20 0229 07/27/20 0548 07/28/20 0501 07/29/20 0620  WBC 12.7* 12.7* 12.8* 12.4* 20.7*  NEUTROABS 11.2* 10.9* 10.9* 10.3* 18.4*  HGB 15.4* 14.9 14.8 14.9 15.4*  HCT 45.3 43.3 44.5 44.3 46.6*  MCV 81.8 81.5 84.9 85.0 85.2  PLT 405* 418* 460* 436* 798*   Basic Metabolic Panel: Recent Labs  Lab 07/25/20 0656 07/26/20 0229 07/27/20 0548 07/28/20 0501 07/29/20 0620  NA 140 141 138 138 140  K 3.5 3.0*  3.1* 3.7 3.7  CL 108 109 105 104 104  CO2 23 24 22  23 26  GLUCOSE 141* 163* 180* 183* 132*  BUN 98* 59* 29* 18 16  CREATININE 1.36* 0.96 0.84 0.84 0.88  CALCIUM 10.2 10.1 9.9 9.8 9.8  MG 2.4 2.1 1.8 2.1 1.7  PHOS 3.3 1.8* 1.9* 2.0* 2.5   GFR: Estimated Creatinine Clearance: 66.4 mL/min (by C-G formula based on SCr of 0.88 mg/dL). Liver Function Tests: Recent Labs  Lab 07/25/20 0656 07/26/20 0229 07/27/20 0548 07/28/20 0501 07/29/20 0620  AST 48* 35 29 27 34  ALT 45* 39 35 31 27  ALKPHOS 63 60 62 59 73  BILITOT 0.7 0.6 0.5 0.4 0.8  PROT 7.0 6.7 7.0 6.9 7.0  ALBUMIN 2.3* 2.4* 2.5* 2.5* 2.6*   Coagulation Profile: Recent Labs  Lab 07/24/20 0914 07/25/20 0656  INR 1.3* 1.4*   Thyroid Function Tests: Recent Labs    07/28/20 0501  TSH 3.558  FREET4 0.94   Anemia Panel: Recent Labs    07/28/20 0501 07/29/20 0620  FERRITIN 479* 566*   Sepsis Labs: Recent Labs  Lab 07/24/20 0914 07/24/20 1145 07/24/20 1425 07/25/20 0656 07/25/20 1521 07/28/20 0501  PROCALCITON  --   --  1.35 1.39  --  0.10  LATICACIDVEN 3.9* 2.5*  --   --  2.6*  --     Recent Results (from the past 240 hour(s))  Urine culture     Status: Abnormal   Collection Time: 07/24/20  9:12 AM   Specimen: Urine, Random  Result Value Ref Range Status   Specimen Description   Final    URINE, RANDOM Performed at [facility], [address] [place], [place] [address]    Special Requests   Final    NONE Performed at [facility], [address] [place], [place] [address]    Culture (A)  Final    <10,000 COLONIES/mL >=100,000 COLONIES/mL Performed at [facility], [place] [address], [place], [place] [address]    Report Status 07/25/2020 FINAL  Final  Blood culture (routine single)     Status: None   Collection Time: 07/24/20  9:14 AM   Specimen: BLOOD LEFT ARM  Result Value Ref Range Status   Specimen Description BLOOD LEFT ARM  Final   Special Requests   Final     BOTTLES DRAWN AEROBIC AND ANAEROBIC Blood Culture adequate volume   Culture   Final    NO GROWTH 5 DAYS Performed at [facility], [address] [place], [place] [address]    Report Status 07/29/2020 FINAL  Final  SARS Coronavirus 2 by RT PCR (hospital order, performed in [place] hospital lab) Nasopharyngeal Nasopharyngeal Swab     Status: Abnormal   Collection Time: 07/24/20  9:16 AM   Specimen: Nasopharyngeal Swab  Result Value Ref Range Status   SARS Coronavirus 2 POSITIVE (A) NEGATIVE Final    Comment: RESULT CALLED TO, READ BACK BY AND VERIFIED WITH:  SAMANTHA HAMILTON AT [id number] 07/24/20 SDR (NOTE) SARS-CoV-2 target nucleic acids are DETECTED  SARS-CoV-2 RNA is generally detectable in upper respiratory specimens  during the acute phase of infection.  Positive results are indicative  of the presence of the identified virus, but do not rule out bacterial infection or co-infection with other pathogens not detected by the test.  Clinical correlation with patient history and  other diagnostic information is necessary to determine patient infection status.  The expected result is negative.  Fact Sheet for Patients:   StrictlyIdeas.no   Fact Sheet for Healthcare Providers:  BankingDealers.co.za    This test is not yet approved or cleared by the [place] and  has been authorized for detection and/or diagnosis of SARS-CoV-2 by FDA under an Emergency Use Authorization (EUA).  This EUA will remain in effect (meaning thi s test can be used) for the duration of  the COVID-19 declaration under Section 564(b)(1) of the Act, 21 U.S.C. section 360-bbb-3(b)(1), unless the authorization is terminated or revoked sooner.  Performed at [facility], [address] [place], [place] [address]     Radiology Studies: DG Chest 1 View  Result Date: 07/29/2020 CLINICAL DATA:  Initial evaluation for acute tachycardia.  History of COVID pneumonia. EXAM: CHEST  1 VIEW COMPARISON:  Prior CTA from 07/28/2020 and radiograph from 07/24/2020. FINDINGS: Transverse heart size stable. Mediastinal silhouette within normal limits. Lungs mildly hypoinflated. Severe diffuse bilateral airspace disease, right greater than left, progressed and worsened as compared to prior radiograph from 07/24/2020. Scattered air bronchograms noted at the lung bases bilaterally. Underlying pulmonary vascular congestion without overt pulmonary edema. No visible pleural effusion. No pneumothorax. Osseous structures unchanged. IMPRESSION: Severe diffuse bilateral airspace disease, right greater than left, progressed and worsened as compared to previous, concerning for worsened multifocal pneumonia. Electronically Signed   By: Jeannine Boga M.D.   On: 07/29/2020 05:47   CT ANGIO CHEST PE W OR WO CONTRAST  Result Date: 07/28/2020 CLINICAL DATA:  Respiratory failure. COVID positive. Rule out pulmonary embolism. EXAM: CT ANGIOGRAPHY CHEST WITH CONTRAST TECHNIQUE: Multidetector CT imaging of the chest was performed using the standard protocol during bolus administration of intravenous contrast. Multiplanar CT image reconstructions and MIPs were obtained to evaluate the vascular anatomy. CONTRAST:  1102mL OMNIPAQUE IOHEXOL 350 MG/ML SOLN COMPARISON:  Chest 07/24/2020 FINDINGS: Cardiovascular: Excellent pulmonary artery opacification. Negative for pulmonary embolism. Moderate pulmonary artery enlargement compatible with pulmonary artery hypertension. Main pulmonary artery 33 mm in diameter. Cardiac enlargement. No pericardial effusion. Thoracic aorta normal in caliber. No significant opacification of the aorta. Mediastinum/Nodes: Negative for mediastinal mass or adenopathy. Lungs/Pleura: Severe diffuse bilateral airspace disease most prominent in the bases. No pleural effusion. Upper Abdomen: Negative Musculoskeletal: No acute skeletal abnormality. Review of the  MIP images confirms the above findings. IMPRESSION: Negative for pulmonary embolism.  Pulmonary artery hypertension. Severe bilateral airspace disease most prominent the bases compatible with COVID pneumonia. Electronically Signed   By: Franchot Gallo M.D.   On: 07/28/2020 12:21   Scheduled Meds: . albuterol  2 puff Inhalation Q6H  . vitamin C  500 mg Oral Daily  . aspirin EC  81 mg Oral Daily  . baricitinib  4 mg Oral Daily  . cholecalciferol  2,000 Units Oral Daily  . digoxin  0.25 mg Intravenous Daily  . Gerhardt's butt cream   Topical TID  . metoprolol succinate  100 mg Oral Daily  . multivitamin with minerals  1 tablet Oral Daily  . nystatin   Topical TID  . pneumococcal 23 valent vaccine  0.5 mL Intramuscular Tomorrow-1000  . predniSONE  50 mg Oral Daily  . zinc sulfate  220 mg Oral Daily   Continuous Infusions: . amiodarone 30 mg/hr (07/29/20 0438)  . diltiazem (CARDIZEM) infusion 15 mg/hr (07/29/20 1412)  . heparin 1,000 Units/hr (07/28/20 1508)    LOS: 5 days   Para Skeans, MD [facility] Pager [phone] If 7PM-7AM, please contact night-coverage [email or web address] Password [facility] 07/29/2020, 3:51 PM

## 2020-07-29 NOTE — Progress Notes (Signed)
A consult was placed to the IV Therapist for another new iv site;  Pt currently requiring at least 3 sites; ultrasound used to place a 20g iv in the Left forearm; pt is on multiple iv medications, including amiodarone, and would benefit from a picc line due to poor access;  CrCl 72

## 2020-07-29 NOTE — Progress Notes (Signed)
Pt remains in afib, RVR 150-170's despite metoprolol 10mg  IV and 5mg  IV, and amio bolus tonight per Sharion Settler, NP's orders.  Pt remains asymptomatic.  Will cont to monitor.

## 2020-07-29 NOTE — Consult Note (Signed)
[facility] for Heparin Infusion Indication: atrial fibrillation  Allergies  Allergen Reactions  . Codeine     Patient Measurements: Height: 5\' 5"  (165.1 cm) Weight: 79.4 kg (175 lb) IBW/kg (Calculated) : 57 Heparin Dosing Weight: 73.7 kg  Vital Signs: Temp: 97.5 F (36.4 C) (09/25 0600) Temp Source: Axillary (09/25 0600) BP: 116/96 (09/25 0618) Pulse Rate: 157 (09/25 0618)  Labs: Recent Labs    07/27/20 0548 07/27/20 0548 07/28/20 0501 07/28/20 0501 07/28/20 1359 07/28/20 2228 07/29/20 0620  HGB 14.8   < > 14.9  --   --   --  15.4*  HCT 44.5  --  44.3  --   --   --  46.6*  PLT 460*  --  436*  --   --   --  429*  HEPARINUNFRC 0.51   < > 0.82*   < > 0.84* 0.58 0.43  CREATININE 0.84  --  0.84  --   --   --  0.88   < > = values in this interval not displayed.    Estimated Creatinine Clearance: 66.4 mL/min (by C-G formula based on SCr of 0.88 mg/dL).   Medical History: Past Medical History:  Diagnosis Date  . Allergy   . Elevated transaminase level   . Hyperlipidemia   . Hypertension   . Obesity     Medications:  No anticoagulation prior to admission per chart review  Assessment: Patient is a 64 y/o F with medical history including hypertension, hypothyroidism who is admitted with acute respiratory failure secondary to pneumonia. Admission complicated by atrial fibrillation with RVR. Pharmacy has been consulted to initiate heparin infusion for stroke prophylaxis in the setting of atrial fibrillation.   Baseline aPTT < 24, INR 1.4. H&H with mildly elevated H&H, platelets.   Hgb 14.9>14.8>14.9 Plt 092>330>076 9/24 CT chest: negative for pulmonary embolism  9/21 1831 HL 0.11; 2500 unit bolus, rate increased to 1300 units/hr 9/22 0229 HL 0.47; rate continued 9/22 0910 HL 0.64; rate continued 9/23 0548 HL 0.51; rate continued   9/24 0501 HL 0.82; rate decreased to 1150 units/hr 9/24 1359 HL 0.84 - verified with lab that  level was drawn appropriately 9/24 2228 HL 0.58   Goal of Therapy:  Heparin level 0.3-0.7 units/ml Monitor platelets by anticoagulation protocol: Yes   Plan:  Heparin level is supratherapeutic at 0.84. Will decrease rate to 1000 units/hr. Recheck heparin level in 6 hours and monitor CBC with AM labs.   9/24:  HL @ 2228 = 0.58 Will continue pt on current rate and draw confirmation level on 9/25 with AM labs.   9/25: HL @ 2263 = 0.43 Will continue pt on current rate and recheck HL on 9/26 with AM labs.    Orene Desanctis, PharmD Pharmacy Resident  07/29/2020 7:29 AM

## 2020-07-29 NOTE — Progress Notes (Signed)
Pt's remains in afib rvr 156 at this time.  Multiple doses of PRN metoprolol given and amio bolus and cardizem bolus and gtts.

## 2020-07-30 ENCOUNTER — Inpatient Hospital Stay: Payer: Medicare Other

## 2020-07-30 DIAGNOSIS — J80 Acute respiratory distress syndrome: Secondary | ICD-10-CM | POA: Diagnosis not present

## 2020-07-30 DIAGNOSIS — J9600 Acute respiratory failure, unspecified whether with hypoxia or hypercapnia: Secondary | ICD-10-CM

## 2020-07-30 DIAGNOSIS — J9621 Acute and chronic respiratory failure with hypoxia: Secondary | ICD-10-CM

## 2020-07-30 DIAGNOSIS — I4891 Unspecified atrial fibrillation: Secondary | ICD-10-CM | POA: Diagnosis not present

## 2020-07-30 DIAGNOSIS — U071 COVID-19: Secondary | ICD-10-CM | POA: Diagnosis not present

## 2020-07-30 DIAGNOSIS — N179 Acute kidney failure, unspecified: Secondary | ICD-10-CM | POA: Diagnosis not present

## 2020-07-30 DIAGNOSIS — A419 Sepsis, unspecified organism: Secondary | ICD-10-CM | POA: Diagnosis not present

## 2020-07-30 LAB — BASIC METABOLIC PANEL
Anion gap: 11 (ref 5–15)
BUN: 18 mg/dL (ref 8–23)
CO2: 27 mmol/L (ref 22–32)
Calcium: 9.4 mg/dL (ref 8.9–10.3)
Chloride: 101 mmol/L (ref 98–111)
Creatinine, Ser: 0.77 mg/dL (ref 0.44–1.00)
GFR calc Af Amer: >60 mL/min (ref >60)
GFR calc non Af Amer: >60 mL/min (ref >60)
Glucose, Bld: 143 mg/dL — ABNORMAL HIGH (ref 70–99)
Potassium: 3.8 mmol/L (ref 3.5–5.1)
Sodium: 139 mmol/L (ref 135–145)

## 2020-07-30 LAB — TRIGLYCERIDES: Triglycerides: 93 mg/dL (ref <150)

## 2020-07-30 LAB — CBC
HCT: 45.4 % (ref 36.0–46.0)
Hemoglobin: 15.4 g/dL — ABNORMAL HIGH (ref 12.0–15.0)
MCH: 28.4 pg (ref 26.0–34.0)
MCHC: 33.9 g/dL (ref 30.0–36.0)
MCV: 83.6 fL (ref 80.0–100.0)
Platelets: 387 10*3/uL (ref 150–400)
RBC: 5.43 MIL/uL — ABNORMAL HIGH (ref 3.87–5.11)
RDW: 15.1 % (ref 11.5–15.5)
WBC: 19 10*3/uL — ABNORMAL HIGH (ref 4.0–10.5)
nRBC: 0.3 % — ABNORMAL HIGH (ref 0.0–0.2)

## 2020-07-30 LAB — BLOOD GAS, ARTERIAL
Acid-Base Excess: 7.4 mmol/L — ABNORMAL HIGH (ref 0.0–2.0)
Acid-Base Excess: 4.9 mmol/L — ABNORMAL HIGH (ref 0.0–2.0)
Bicarbonate: 29.5 mmol/L — ABNORMAL HIGH (ref 20.0–28.0)
Bicarbonate: 31.4 mmol/L — ABNORMAL HIGH (ref 20.0–28.0)
FIO2: 0.85
FIO2: 100
MECHVT: 430 mL
O2 Saturation: 86.6 %
O2 Saturation: 92 %
PEEP: 10 cmH2O
Patient temperature: 37
Patient temperature: 37
RATE: 30 resp/min
pCO2 arterial: 33 mmHg (ref 32.0–48.0)
pCO2 arterial: 53 mmHg — ABNORMAL HIGH (ref 32.0–48.0)
pH, Arterial: 7.56 — ABNORMAL HIGH (ref 7.350–7.450)
pH, Arterial: 7.38 (ref 7.350–7.450)
pO2, Arterial: 44 mmHg — ABNORMAL LOW (ref 83.0–108.0)
pO2, Arterial: 65 mmHg — ABNORMAL LOW (ref 83.0–108.0)

## 2020-07-30 LAB — MRSA PCR SCREENING: MRSA by PCR: NEGATIVE

## 2020-07-30 LAB — GLUCOSE, CAPILLARY
Glucose-Capillary: 163 mg/dL — ABNORMAL HIGH (ref 70–99)
Glucose-Capillary: 128 mg/dL — ABNORMAL HIGH (ref 70–99)

## 2020-07-30 LAB — HEPARIN LEVEL (UNFRACTIONATED)
Heparin Unfractionated: 0.25 IU/mL — ABNORMAL LOW (ref 0.30–0.70)
Heparin Unfractionated: <0.1 IU/mL — ABNORMAL LOW (ref 0.30–0.70)

## 2020-07-30 LAB — DIGOXIN LEVEL: Digoxin Level: 1.2 ng/mL (ref 1.0–2.0)

## 2020-07-30 LAB — MAGNESIUM: Magnesium: 1.9 mg/dL (ref 1.7–2.4)

## 2020-07-30 MED ORDER — MAGNESIUM SULFATE 2 GM/50ML IV SOLN
2.0000 g | Freq: Once | INTRAVENOUS | Status: AC
  Administered 2020-07-30: 2 g via INTRAVENOUS
  Filled 2020-07-30: qty 50

## 2020-07-30 MED ORDER — MIDAZOLAM 50MG/50ML (1MG/ML) PREMIX INFUSION: 0.5000 mg/h | INTRAVENOUS | Status: DC

## 2020-07-30 MED ORDER — FENTANYL CITRATE (PF) 100 MCG/2ML IJ SOLN: 100.0000 ug | Freq: Once | INTRAMUSCULAR | Status: AC

## 2020-07-30 MED ORDER — HALOPERIDOL LACTATE 5 MG/ML IJ SOLN
5.0000 mg | Freq: Once | INTRAMUSCULAR | Status: AC
  Administered 2020-07-30: 5 mg via INTRAVENOUS
  Filled 2020-07-30: qty 1

## 2020-07-30 MED ORDER — MIDAZOLAM HCL 2 MG/2ML IJ SOLN: 4.0000 mg | Freq: Once | INTRAMUSCULAR | Status: AC

## 2020-07-30 MED ORDER — POTASSIUM CHLORIDE CRYS ER 20 MEQ PO TBCR
40.0000 meq | EXTENDED_RELEASE_TABLET | Freq: Once | ORAL | Status: AC
  Administered 2020-07-30: 40 meq via ORAL
  Filled 2020-07-30: qty 2

## 2020-07-30 MED ORDER — VECURONIUM BROMIDE 10 MG IV SOLR
10.0000 mg | Freq: Once | INTRAVENOUS | Status: AC
  Administered 2020-07-30: 10 mg via INTRAVENOUS
  Filled 2020-07-30: qty 10

## 2020-07-30 MED ORDER — CHLORHEXIDINE GLUCONATE CLOTH 2 % EX PADS
6.0000 | MEDICATED_PAD | Freq: Every day | CUTANEOUS | Status: DC
  Administered 2020-07-30 – 2020-08-12 (×15): 6 via TOPICAL

## 2020-07-30 MED ORDER — PROPOFOL 1000 MG/100ML IV EMUL
INTRAVENOUS | Status: AC
  Administered 2020-07-30: 10 ug
  Filled 2020-07-30: qty 100

## 2020-07-30 MED ORDER — HALOPERIDOL LACTATE 5 MG/ML IJ SOLN
INTRAMUSCULAR | Status: AC
  Administered 2020-07-30: 5 mg
  Filled 2020-07-30: qty 1

## 2020-07-30 MED ORDER — ROCURONIUM BROMIDE 50 MG/5ML IV SOLN
50.0000 mg | Freq: Once | INTRAVENOUS | Status: AC
  Administered 2020-07-30: 50 mg via INTRAVENOUS

## 2020-07-30 MED ORDER — METOPROLOL TARTRATE 5 MG/5ML IV SOLN
5.0000 mg | Freq: Once | INTRAVENOUS | Status: AC
  Administered 2020-07-30: 5 mg via INTRAVENOUS

## 2020-07-30 MED ORDER — FENTANYL 2500MCG IN NS 250ML (10MCG/ML) PREMIX INFUSION
0.0000 ug/h | INTRAVENOUS | Status: DC
  Administered 2020-07-30: 25 ug/h via INTRAVENOUS
  Administered 2020-07-30 (×2): 400 ug/h via INTRAVENOUS
  Administered 2020-07-31: 300 ug/h via INTRAVENOUS
  Filled 2020-07-30 (×4): qty 250

## 2020-07-30 MED ORDER — FENTANYL CITRATE (PF) 100 MCG/2ML IJ SOLN
INTRAMUSCULAR | Status: AC
  Administered 2020-07-30: 100 ug
  Filled 2020-07-30: qty 2

## 2020-07-30 MED ORDER — HALOPERIDOL LACTATE 5 MG/ML IJ SOLN
5.0000 mg | Freq: Four times a day (QID) | INTRAMUSCULAR | Status: DC | PRN
  Administered 2020-08-08 – 2020-08-10 (×3): 5 mg via INTRAVENOUS
  Filled 2020-07-30 (×3): qty 1

## 2020-07-30 MED ORDER — MIDAZOLAM HCL 2 MG/2ML IJ SOLN
INTRAMUSCULAR | Status: AC
  Administered 2020-07-30: 2 mg
  Filled 2020-07-30: qty 4

## 2020-07-30 MED ORDER — METOPROLOL TARTRATE 5 MG/5ML IV SOLN
INTRAVENOUS | Status: AC
  Filled 2020-07-30: qty 5

## 2020-07-30 MED ORDER — HALOPERIDOL LACTATE 5 MG/ML IJ SOLN: 5.0000 mg | Freq: Once | INTRAMUSCULAR | Status: AC

## 2020-07-30 MED ORDER — ROCURONIUM BROMIDE 50 MG/5ML IV SOLN
INTRAVENOUS | Status: AC
  Filled 2020-07-30: qty 1

## 2020-07-30 MED ORDER — NOREPINEPHRINE 4 MG/250ML-% IV SOLN
0.0000 ug/min | INTRAVENOUS | Status: DC
  Administered 2020-07-30: 2 ug/min via INTRAVENOUS
  Administered 2020-07-30: 20 ug/min via INTRAVENOUS
  Administered 2020-07-31: 25 ug/min via INTRAVENOUS
  Administered 2020-07-31: 20 ug/min via INTRAVENOUS
  Filled 2020-07-30 (×4): qty 250

## 2020-07-30 MED ORDER — PROPOFOL 1000 MG/100ML IV EMUL
5.0000 ug/kg/min | INTRAVENOUS | Status: DC
  Administered 2020-07-30: 40 ug/kg/min via INTRAVENOUS
  Administered 2020-07-30: 20 ug/kg/min via INTRAVENOUS
  Administered 2020-07-31: 15 ug/kg/min via INTRAVENOUS
  Administered 2020-07-31: 30 ug/kg/min via INTRAVENOUS
  Administered 2020-07-31: 40 ug/kg/min via INTRAVENOUS
  Administered 2020-08-01: 15 ug/kg/min via INTRAVENOUS
  Administered 2020-08-01: 40 ug/kg/min via INTRAVENOUS
  Administered 2020-08-01: 25 ug/kg/min via INTRAVENOUS
  Administered 2020-08-01: 40 ug/kg/min via INTRAVENOUS
  Administered 2020-08-02: 15 ug/kg/min via INTRAVENOUS
  Administered 2020-08-03 – 2020-08-04 (×4): 20 ug/kg/min via INTRAVENOUS
  Administered 2020-08-04 – 2020-08-05 (×2): 15 ug/kg/min via INTRAVENOUS
  Administered 2020-08-05: 10 ug/kg/min via INTRAVENOUS
  Filled 2020-07-30 (×17): qty 100

## 2020-07-30 MED ORDER — NOREPINEPHRINE 4 MG/250ML-% IV SOLN
INTRAVENOUS | Status: AC
  Administered 2020-07-30: 4 mg
  Filled 2020-07-30: qty 250

## 2020-07-30 MED ORDER — HEPARIN BOLUS VIA INFUSION
1100.0000 [IU] | Freq: Once | INTRAVENOUS | Status: DC
  Filled 2020-07-30: qty 1100

## 2020-07-30 NOTE — Progress Notes (Signed)
O2 sats dropped into the 70's.  Pt checked on and found that sdhe had again taken her O2 off.  O2 replaced and mitts placed over both hands.  Pt instructed on reasoning and verbalized understand.  O2 sats returned slowly to above 85% on 15 liters [place].

## 2020-07-30 NOTE — Progress Notes (Signed)
Resp therapist came to see patient and placed and nonrebreather mask over her hfnc at 15 liters.  O2 sat now 93%.  Pt remains in afib rvr despite amio and cardizem gtts and IV metoprolol PRN doses.

## 2020-07-30 NOTE — Consult Note (Signed)
Reason for Consult: Severe respiratory distress and patient with COVID-19 pneumonia. Referring Physician: Florina Ou, MD  Courtney Anderson is an 64 y.o. female.  HPI: Patient is a 64 year old female with history noted below, who was admitted to [facility] on 2020-07-24 brought in via EMS as a "code sepsis".  Patient subsequently tested positive for COVID-19.  She presented with oxygen saturations of 69% on room air and had to be placed on nonrebreather mask to achieve oxygen saturations in the 90s.  She was subsequently admitted to the Covid unit for management of COVID-19 pneumonia.  She was noted to have acute kidney injury she was treated with remdesivir, zinc, vitamin C and antitussives.  She was placed empirically on Rocephin and azithromycin.  She continued to have increased FiO2 requirements.  She was refusing to self prone.  She had atrial fibrillation and was seen by cardiology to assist in management.  Today she was noted to be in increasing respiratory distress and hypoxic.  She was brought to the intensive care unit emergently after a rapid response call was placed.  Upon arrival to the intensive care unit the patient was noted to be in extremis and decision was made to intubate.  No further history can be obtained.  Past Medical History:  Diagnosis Date  . Allergy   . Elevated transaminase level   . Hyperlipidemia   . Hypertension   . Obesity     Past Surgical History:  Procedure Laterality Date  . BREAST SURGERY    . ECTOPIC PREGNANCY SURGERY      Family History  Problem Relation Age of Onset  . Hypertension Father   . Stroke Father   . Hypertension Mother   . Arthritis Mother   . Aneurysm Sister   . Hypertension Sister   . Hypertension Brother   . Hypertension Sister   . Hypertension Brother     Social History:  reports that she has quit smoking. She has never used smokeless tobacco. She reports current alcohol use. She reports that she does not use drugs.  Allergies:   Allergies  Allergen Reactions  . Codeine     Medications: I have reviewed the patient's current medications.  Results for orders placed or performed during the hospital encounter of 07/24/20 (from the past 48 hour(s))  Heparin level (unfractionated)     Status: Abnormal   Collection Time: 07/28/20  1:59 PM  Result Value Ref Range   Heparin Unfractionated 0.84 (H) 0.30 - 0.70 IU/mL    Comment: (NOTE) If heparin results are below expected values, and patient dosage has  been confirmed, suggest follow up testing of antithrombin III levels. Performed at [facility], [place], [place] [address]   Heparin level (unfractionated)     Status: None   Collection Time: 07/28/20 10:28 PM  Result Value Ref Range   Heparin Unfractionated 0.58 0.30 - 0.70 IU/mL    Comment: (NOTE) If heparin results are below expected values, and patient dosage has  been confirmed, suggest follow up testing of antithrombin III levels. Performed at [facility], [address] [place], [place] [address]   CBC with Differential/Platelet     Status: Abnormal   Collection Time: 07/29/20  6:20 AM  Result Value Ref Range   WBC 20.7 (H) 4.0 - 10.5 K/uL   RBC 5.47 (H) 3.87 - 5.11 MIL/uL   Hemoglobin 15.4 (H) 12.0 - 15.0 g/dL   HCT 46.6 (H) 36 - 46 %   MCV  85.2 80.0 - 100.0 fL   MCH 28.2 26.0 - 34.0 pg   MCHC 33.0 30.0 - 36.0 g/dL   RDW 15.3 11.5 - 15.5 %   Platelets 429 (H) 150 - 400 K/uL   nRBC 0.3 (H) 0.0 - 0.2 %   Neutrophils Relative % 89 %   Neutro Abs 18.4 (H) 1.7 - 7.7 K/uL   Lymphocytes Relative 6 %   Lymphs Abs 1.3 0.7 - 4.0 K/uL   Monocytes Relative 2 %   Monocytes Absolute 0.4 0 - 1 K/uL   Eosinophils Relative 0 %   Eosinophils Absolute 0.0 0 - 0 K/uL   Basophils Relative 0 %   Basophils Absolute 0.1 0 - 0 K/uL   Immature Granulocytes 3 %   Abs Immature Granulocytes 0.52 (H) 0.00 - 0.07 K/uL    Comment: Performed at [facility], [address] [place], [place] [address]  Comprehensive metabolic panel     Status: Abnormal   Collection Time: 07/29/20  6:20 AM  Result Value Ref Range   Sodium 140 135 - 145 mmol/L   Potassium 3.7 3.5 - 5.1 mmol/L   Chloride 104 98 - 111 mmol/L   CO2 26 22 - 32 mmol/L   Glucose, Bld 132 (H) 70 - 99 mg/dL    Comment: Glucose reference range applies only to samples taken after fasting for at least 8 hours.   BUN 16 8 - 23 mg/dL   Creatinine, Ser 0.88 0.44 - 1.00 mg/dL   Calcium 9.8 8.9 - 10.3 mg/dL   Total Protein 7.0 6.5 - 8.1 g/dL   Albumin 2.6 (L) 3.5 - 5.0 g/dL   AST 34 15 - 41 U/L   ALT 27 0 - 44 U/L   Alkaline Phosphatase 73 38 - 126 U/L   Total Bilirubin 0.8 0.3 - 1.2 mg/dL   GFR calc non Af Amer >60 >60 mL/min   GFR calc Af Amer >60 >60 mL/min   Anion gap 10 5 - 15    Comment: Performed at [facility], [address] [place], [place] [address]  C-reactive protein     Status: Abnormal   Collection Time: 07/29/20  6:20 AM  Result Value Ref Range   CRP 6.9 (H) <1.0 mg/dL    Comment: Performed at [facility] [address], [place], [place] [address]  Fibrin derivatives D-Dimer [facility] only)     Status: Abnormal   Collection Time: 07/29/20  6:20 AM  Result Value Ref Range   Fibrin derivatives D-dimer ([facility]) 1,182.22 (H) 0.00 - 499.00 ng/mL (FEU)    Comment: (NOTE) <> Exclusion of Venous Thromboembolism (VTE) - OUTPATIENT ONLY   (Emergency Department or [place])    0-499 ng/ml (FEU): With a low to intermediate pretest probability                      for VTE this test result excludes the diagnosis                      of VTE.   >499 ng/ml (FEU) : VTE not excluded; additional work up for VTE is                      required.  <> Testing on Inpatients and Evaluation of Disseminated Intravascular   Coagulation (DIC) Reference Range:   0-499 ng/ml (FEU) Performed at [facility], [address], [place], [place]  [address]   Ferritin     Status: Abnormal    Collection Time: 07/29/20  6:20 AM  Result Value Ref Range   Ferritin 566 (H) 11 - 307 ng/mL    Comment: Performed at [facility], [address] [place], [place] [address]  Magnesium     Status: None   Collection Time: 07/29/20  6:20 AM  Result Value Ref Range   Magnesium 1.7 1.7 - 2.4 mg/dL    Comment: Performed at [facility], [address] [place], [place] [address]  Phosphorus     Status: None   Collection Time: 07/29/20  6:20 AM  Result Value Ref Range   Phosphorus 2.5 2.5 - 4.6 mg/dL    Comment: Performed at [facility], [place], [place] [address]  Heparin level (unfractionated)     Status: None   Collection Time: 07/29/20  6:20 AM  Result Value Ref Range   Heparin Unfractionated 0.43 0.30 - 0.70 IU/mL    Comment: (NOTE) If heparin results are below expected values, and patient dosage has  been confirmed, suggest follow up testing of antithrombin III levels. Performed at [facility], [address] [place], [place] [address]   Blood gas, arterial     Status: Abnormal   Collection Time: 07/29/20  8:58 AM  Result Value Ref Range   FIO2  12L HFNC    Delivery systems NASAL CANNULA    pH, Arterial 7.61 (HH) 7.35 - 7.45    Comment: CRITICAL RESULT CALLED TO, READ BACK BY AND VERIFIED WITH:  PATEL E. AT 0915, 07/29/2020, LS    pCO2 arterial 26 (L) 32 - 48 mmHg    Comment: CRITICAL RESULT CALLED TO, READ BACK BY AND VERIFIED WITH:  PATEL E. AT 0915, 07/29/2020, LS    pO2, Arterial 43 (L) 83 - 108 mmHg   Bicarbonate 26.1 20.0 - 28.0 mmol/L   Acid-Base Excess 5.9 (H) 0.0 - 2.0 mmol/L   O2 Saturation 87.7 %   Patient temperature 37.0    Collection site LEFT RADIAL    Sample type VENOUS    Allens test (pass/fail) PASS PASS    Comment: Performed at [facility], [address] [place], [place] [address]  Digoxin level     Status: None   Collection Time: 07/30/20  4:48 AM  Result Value Ref Range    Digoxin Level 1.2 1.0 - 2.0 ng/mL    Comment: Please note change in reference range. Performed at [facility], [place], [place] [address]   Heparin level (unfractionated)     Status: Abnormal   Collection Time: 07/30/20  4:48 AM  Result Value Ref Range   Heparin Unfractionated 0.25 (L) 0.30 - 0.70 IU/mL    Comment: (NOTE) If heparin results are below expected values, and patient dosage has  been confirmed, suggest follow up testing of antithrombin III levels. Performed at [facility], [address] [place], [place] [address]   Basic metabolic panel     Status: Abnormal   Collection Time: 07/30/20  4:48 AM  Result Value Ref Range   Sodium 139 135 - 145 mmol/L   Potassium 3.8 3.5 - 5.1 mmol/L   Chloride 101 98 - 111 mmol/L   CO2 27 22 - 32 mmol/L   Glucose, Bld 143 (H) 70 - 99 mg/dL    Comment: Glucose reference range applies only to samples taken after fasting for at least 8 hours.  BUN 18 8 - 23 mg/dL   Creatinine, Ser 0.77 0.44 - 1.00 mg/dL   Calcium 9.4 8.9 - 10.3 mg/dL   GFR calc non Af Amer >60 >60 mL/min   GFR calc Af Amer >60 >60 mL/min   Anion gap 11 5 - 15    Comment: Performed at [facility], [address] [place], [place] [address]  Magnesium     Status: None   Collection Time: 07/30/20  4:48 AM  Result Value Ref Range   Magnesium 1.9 1.7 - 2.4 mg/dL    Comment: Performed at [facility], [address] [place], [place] [address]  CBC     Status: Abnormal   Collection Time: 07/30/20  4:49 AM  Result Value Ref Range   WBC 19.0 (H) 4.0 - 10.5 K/uL   RBC 5.43 (H) 3.87 - 5.11 MIL/uL   Hemoglobin 15.4 (H) 12.0 - 15.0 g/dL   HCT 45.4 36 - 46 %   MCV 83.6 80.0 - 100.0 fL   MCH 28.4 26.0 - 34.0 pg   MCHC 33.9 30.0 - 36.0 g/dL   RDW 15.1 11.5 - 15.5 %   Platelets 387 150 - 400 K/uL   nRBC 0.3 (H) 0.0 - 0.2 %    Comment: Performed at [facility], [address] [place], [place] [address]   Blood gas, arterial     Status: Abnormal   Collection Time: 07/30/20  8:28 AM  Result Value Ref Range   FIO2 0.85    Mode HI FLOW NASAL CANNULA    pH, Arterial 7.56 (H) 7.35 - 7.45   pCO2 arterial 33 32 - 48 mmHg   pO2, Arterial 44 (L) 83 - 108 mmHg   Bicarbonate 29.5 (H) 20.0 - 28.0 mmol/L   Acid-Base Excess 7.4 (H) 0.0 - 2.0 mmol/L   O2 Saturation 86.6 %   Patient temperature 37.0    Collection site LEFT BRACHIAL    Sample type ARTERIAL DRAW     Comment: Performed at [facility], [address] [place], [place] [address]  Glucose, capillary     Status: Abnormal   Collection Time: 07/30/20 10:20 AM  Result Value Ref Range   Glucose-Capillary 163 (H) 70 - 99 mg/dL    Comment: Glucose reference range applies only to samples taken after fasting for at least 8 hours.    DG Chest 1 View  Result Date: 07/29/2020 CLINICAL DATA:  Initial evaluation for acute tachycardia. History of COVID pneumonia. EXAM: CHEST  1 VIEW COMPARISON:  Prior CTA from 07/28/2020 and radiograph from 07/24/2020. FINDINGS: Transverse heart size stable. Mediastinal silhouette within normal limits. Lungs mildly hypoinflated. Severe diffuse bilateral airspace disease, right greater than left, progressed and worsened as compared to prior radiograph from 07/24/2020. Scattered air bronchograms noted at the lung bases bilaterally. Underlying pulmonary vascular congestion without overt pulmonary edema. No visible pleural effusion. No pneumothorax. Osseous structures unchanged. IMPRESSION: Severe diffuse bilateral airspace disease, right greater than left, progressed and worsened as compared to previous, concerning for worsened multifocal pneumonia. Electronically Signed   By: Jeannine Boga M.D.   On: 07/29/2020 05:47    Review of Systems  Unable to perform ROS: Acuity of condition   Blood pressure (!) 143/115, pulse (!) 128, temperature 97.6 F (36.4 C), temperature source Axillary, resp. rate (!) 34,  height 5\' 5"  (1.651 m), weight 79.2 kg, SpO2 92 %.   Physical Exam physical examination is limited due to need for PPE/CAPR GENERAL:  Obese poorly kempt woman in severe respiratory distress on BiPAP . HEAD: Normocephalic, atraumatic.  EYES: Pupils equal, round, reactive to light.  No scleral icterus.  MOUTH: Poor dentition, missing teeth.  Oral mucosa moist. NECK: Supple. No thyromegaly. Trachea midline. No JVD.  No adenopathy. PULMONARY: Symmetrical breath sounds, coarse, cannot appreciate further due to PPE/CAPR. CARDIOVASCULAR: Monitor shows tachycardia, at times irregular. ABDOMEN: Obese abdomen, soft, nondistended. MUSCULOSKELETAL: No joint deformity, no clubbing, no edema.  NEUROLOGIC: Obtunded SKIN: Warm and dry, multiple areas of skin breakdown particularly in the groin area, pannus, and other intertriginous areas.  Present prior to admission PSYCH: Unable to assess due to acuity of condition.   Assessment/Plan:  Acute respiratory failure with hypoxia due to COVID-19 ARDS COVID-19 pneumonia Proceed with intubation and mechanical ventilation ARDS protocol, 6 cc/kg of PBW Recruitment maneuvers as necessary Prone positioning if no improvement Completed remdesivir Continue Rocephin and azithromycin for now  Atrial fibrillation with RVR Due to physiologic stress/hypoxia Hx: Hyperthyroidism treated with RAI Continue amiodarone and calcium channel blocker Cardiology following Continue cardiac monitoring Correct electrolytes as needed Heparin infusion  Severe intertrigo Multiple skin breakdown areas Wound care following  Prophylaxis GI: Famotidine DVT: Patient on heparin infusion  Discussed with Dr. Posey Pronto, PCCM will assume care while patient is in ICU.  Total critical care time excluding procedure time 75 minutes.  Renold Don, MD [facility]  07/30/2020, 12:08 PM    *This note was dictated using voice recognition software/Dragon.  Despite best efforts to  proofread, errors can occur which can change the meaning.  Any change was purely unintentional.

## 2020-07-30 NOTE — Progress Notes (Signed)
Pt is relaxed and cooperative.  Mitts removed.  Pt verbalized that she will keep O2 on.

## 2020-07-30 NOTE — Progress Notes (Signed)
Heparin gtt restarted per Lattie Haw in pharmacy. Labs entered by pharmacy.

## 2020-07-30 NOTE — Progress Notes (Signed)
PHARMACY CONSULT NOTE - FOLLOW UP  Pharmacy Consult for Electrolyte Monitoring and Replacement   Recent Labs: Potassium (mmol/L)  Date Value  07/30/2020 3.8   Magnesium (mg/dL)  Date Value  07/30/2020 1.9   Calcium (mg/dL)  Date Value  07/30/2020 9.4   Calcium, Total (PTH) (mg/dL)  Date Value  07/24/2020 10.1   Albumin (g/dL)  Date Value  07/29/2020 2.6 (L)  10/22/2019 4.2   Phosphorus (mg/dL)  Date Value  07/29/2020 2.5   Sodium (mmol/L)  Date Value  07/30/2020 139  10/22/2019 139   Assessment: 64 yo female with PMH for HTN and hypotension who was admitted with sepsis, acute respiratory failure, and new onset A. Fib. Patient had hypercalcemia consistent with primary hyperparathyroidism with PTH 118>102 on this admission. Patient is on amiodarone drip and daily digoxin. Pharmacy consulted for electrolyte monitoring and replacement.   Goal of Therapy:  Electrolytes WNL K> 4 Mg >2   Plan:  Will order KCL 3mEq PO x 1 dose.  Mg 2 g IV x 1.  Plan to F/U with AM labs and continue to replace as needed.   Oswald Hillock, PharmD 07/30/2020 8:17 AM

## 2020-07-30 NOTE — Procedures (Signed)
Central Venous Catheter Placement:TRIPLE LUMEN CVL   Procedure: Insertion of Non-tunneled Central Venous Catheter(36556) with US guidance (99774)   Indication(s) Medication administration and Difficult access  Patient has limited or no vascular access.   Consent Emergent  Anesthesia Patient sedated and mechanically ventilated   Timeout Verified patient identification, verified procedure, site/side was marked, verified correct patient position, special equipment/implants available, medications/allergies/relevant history reviewed, required imaging and test results available.    Sterile Technique Maximal sterile technique including full sterile barrier drape, hand hygiene, sterile gown, sterile gloves, mask, hair covering, sterile ultrasound probe cover (if used).   Hand washing performed prior to starting the procedure.    Procedure Description Area of catheter insertion was cleaned with chlorhexidine and draped in sterile fashion.  With real-time ultrasound guidance a central venous catheter was placed into the right internal jugular vein. Nonpulsatile blood flow and easy flushing noted in all ports.  The catheter was sutured in place and sterile dressing applied.   A triple lumen catheter was placed in LEFT internal Jugular Vein There was good blood return, catheter caps were placed on lumens, catheter flushed easily, the line was secured and a sterile dressing and BIO-PATCH applied.    Complications/Tolerance None; patient tolerated the procedure well. Chest X-ray is ordered to verify placement     EBL < 5 ml   Specimen(s) None   Number of Attempts: 1 Complications:none Chest Radiograph indicated and ordered.  Operator: Windy Canny. Derrill Kay, MD [facility]   *This note was dictated using voice recognition software/Dragon.  Despite best efforts to proofread, errors can occur which can change the meaning.  Any change was purely unintentional.

## 2020-07-30 NOTE — Procedures (Signed)
Endotracheal Intubation: Patient required placement of an artificial airway secondary to respiratory failure with hypoxia due to COVID-19 infection/ARDS.  Consent: Emergent.   Hand washing performed prior to starting the procedure.   Medications administered for sedation prior to procedure:  Midazolam 2 mg IV, rocuronium 40 mg IV, Fentanyl 50 mcg IV.    A time out procedure was called and correct patient, name, & ID confirmed. Needed supplies and equipment were assembled and checked to include ETT, 10 ml syringe, Glidescope, Mac and Miller blades, suction, oxygen and bag mask valve, end tidal CO2 monitor.   Patient was positioned to align the mouth and pharynx to facilitate visualization of the glottis.   Heart rate, SpO2 and blood pressure was continuously monitored during the procedure. Pre-oxygenation was conducted prior to intubation despite aggressive preoxygenation saturations would not come up higher than 80% with ball-valve mask ventilation and PEEP valve in place.  Once the patient was adequately sedated the glide scope with a #4 blade was utilized to visualize the airway.  The airway appeared hyperemic and there was mild heme on the posterior pharynx.  A #8 ET tube was placed under direct visualization.  Procedure was done in 1 attempt.  ETT was secured at 23 cm mark.  Placement was confirmed by auscuitation of lungs with good breath sounds bilaterally and no epigastric sounds.  Condensation was noted on endotracheal tube.  EZ Cap CO2 detector in place with appropriate color change.   Complications: None .  Patient tolerated well.  Operator: Courtney Anderson  Chest radiograph ordered and pending.    Courtney Don, MD [facility]   *This note was dictated using voice recognition software/Dragon.  Despite best efforts to proofread, errors can occur which can change the meaning.  Any change was purely unintentional.

## 2020-07-30 NOTE — Progress Notes (Signed)
Ch arrived at room in response to RR. Respiratory therapist attempting to achieve oxygen saturation upon arrival. Ch had checked wrong chart and walked into room with normal mask, gown and gloves. RT let Ch know about it. Ch left to get N95 and face shield to enter room again. Doctor calmed Pt down and helped her understand what was going on. No family present at this time. Ch will be paged if further support is needed.

## 2020-07-30 NOTE — Progress Notes (Addendum)
RN at bedside to assess pt/ pt alert but noted to have labored breathing on 15L HFNC- 02 sats sustaing in low 80s- at times dropping into 50-60s/  afib RVR - HR 160s-170s/ iv cardizem and amio infusing/ Resp called to bedside to apply BIPAP as ordered by MD/ pt refusing to wear BIPAP/ tolerating BIPAP after Haldol given/ BIPAP at 100% and pts o2 sats continues to sustain in the 80s/ Rapid Response called/ orders to transfer to CCU/ report given to CCU nurse at bedside/ MD updated pts family.

## 2020-07-30 NOTE — Progress Notes (Signed)
Haldol 5mg  given IVP after pt removed her oxygen and desated into 60-70% range.  She became very agitated and  Combative, yelling inappropriately .  Pt instructed of need for oxygen but she refused.  CNA, Vaughan Basta came in to assist and we were able to put her mitt back on and replace her O2.  Her sats came up to mid 80's.  Np was notified and haldol ordered.  Will continue to monitor.

## 2020-07-30 NOTE — Progress Notes (Signed)
Pt found with O2 off and had pulled out her IV left forearm with cath intact. O2 replaced.   22g insert to left wrist w/ rap[id blood return and flushed w/o resist.  Amio gtt restarted at this time.  20g insert to rt [facility] w/ rapid blood return.  Flushed w/o resist.  All IV sites wrapped in kerlix and pt instructed not to pull at them.  Pt verbalized understand.

## 2020-07-30 NOTE — Progress Notes (Signed)
OG tube advanced to 75 cm at the lip.

## 2020-07-30 NOTE — Consult Note (Signed)
Courtney Anderson for Heparin Infusion Indication: atrial fibrillation  Allergies  Allergen Reactions  . Codeine     Patient Measurements: Height: 5\' 5"  (165.1 cm) Weight: 79.2 kg (174 lb 9.7 oz) IBW/kg (Calculated) : 57 Heparin Dosing Weight: 73.7 kg  Vital Signs: Temp: 97.6 F (36.4 C) (09/26 0500) Temp Source: Axillary (09/26 0500) BP: 117/78 (09/26 0500) Pulse Rate: 36 (09/26 0500)  Labs: Recent Labs    07/28/20 0501 07/28/20 1359 07/28/20 2228 07/29/20 0620 07/30/20 0448  HGB 14.9  --   --  15.4*  --   HCT 44.3  --   --  46.6*  --   PLT 436*  --   --  429*  --   HEPARINUNFRC 0.82*   < > 0.58 0.43 0.25*  CREATININE 0.84  --   --  0.88 0.77   < > = values in this interval not displayed.    Estimated Creatinine Clearance: 72.9 mL/min (by C-G formula based on SCr of 0.77 mg/dL).   Medical History: Past Medical History:  Diagnosis Date  . Allergy   . Elevated transaminase level   . Hyperlipidemia   . Hypertension   . Obesity     Medications:  No anticoagulation prior to admission per chart review  Assessment: Patient is a 64 y/o F with medical history including hypertension, hypothyroidism who is admitted with acute respiratory failure secondary to pneumonia. Admission complicated by atrial fibrillation with RVR. Pharmacy has been consulted to initiate heparin infusion for stroke prophylaxis in the setting of atrial fibrillation.   Baseline aPTT < 24, INR 1.4. H&H with mildly elevated H&H, platelets.   Hgb 14.9>14.8>14.9 Plt 174>944>967 9/24 CT chest: negative for pulmonary embolism  9/21 1831 HL 0.11; 2500 unit bolus, rate increased to 1300 units/hr 9/22 0229 HL 0.47; rate continued 9/22 0910 HL 0.64; rate continued 9/23 0548 HL 0.51; rate continued   9/24 0501 HL 0.82; rate decreased to 1150 units/hr 9/24 1359 HL 0.84 - verified with lab that level was drawn appropriately 9/24 2228 HL 0.58   Goal of Therapy:   Heparin level 0.3-0.7 units/ml Monitor platelets by anticoagulation protocol: Yes   Plan:  Heparin level is supratherapeutic at 0.84. Will decrease rate to 1000 units/hr. Recheck heparin level in 6 hours and monitor CBC with AM labs.   9/24:  HL @ 2228 = 0.58 Will continue pt on current rate and draw confirmation level on 9/25 with AM labs.   9/25: HL @ 5916 = 0.43 Will continue pt on current rate and recheck HL on 9/26 with AM labs.   9/26: HL @ 0448 = 0.25 Will order Heparin 1100 units IV X 1 bolus and increase drip rate to 1150 units/hr.  Will recheck HL 6 hrs after rate change.    Orene Desanctis, PharmD Clinical pharmacist  07/30/2020 6:50 AM

## 2020-07-30 NOTE — Progress Notes (Signed)
SUBJECTIVE: Patient in respiratory distress.  And moved to ICU.   Vitals:   07/30/20 0600 07/30/20 0700 07/30/20 0857 07/30/20 0948  BP: (!) 134/94 (!) 143/115    Pulse: 64 (!) 55 (!) 142 (!) 128  Resp: 18 19 (!) 26 (!) 34  Temp:      TempSrc:      SpO2: 90% (!) 85% (!) 87% 92%  Weight:      Height:        Intake/Output Summary (Last 24 hours) at 07/30/2020 1111 Last data filed at 07/30/2020 0500 Gross per 24 hour  Intake 595.4 ml  Output 550 ml  Net 45.4 ml    LABS: Basic Metabolic Panel: Recent Labs    07/28/20 0501 07/28/20 0501 07/29/20 0620 07/30/20 0448  NA 138   < > 140 139  K 3.7   < > 3.7 3.8  CL 104   < > 104 101  CO2 23   < > 26 27  GLUCOSE 183*   < > 132* 143*  BUN 18   < > 16 18  CREATININE 0.84   < > 0.88 0.77  CALCIUM 9.8   < > 9.8 9.4  MG 2.1   < > 1.7 1.9  PHOS 2.0*  --  2.5  --    < > = values in this interval not displayed.   Liver Function Tests: Recent Labs    07/28/20 0501 07/29/20 0620  AST 27 34  ALT 31 27  ALKPHOS 59 73  BILITOT 0.4 0.8  PROT 6.9 7.0  ALBUMIN 2.5* 2.6*   No results for input(s): LIPASE, AMYLASE in the last 72 hours. CBC: Recent Labs    07/28/20 0501 07/28/20 0501 07/29/20 0620 07/30/20 0449  WBC 12.4*   < > 20.7* 19.0*  NEUTROABS 10.3*  --  18.4*  --   HGB 14.9   < > 15.4* 15.4*  HCT 44.3   < > 46.6* 45.4  MCV 85.0   < > 85.2 83.6  PLT 436*   < > 429* 387   < > = values in this interval not displayed.   Cardiac Enzymes: No results for input(s): CKTOTAL, CKMB, CKMBINDEX, TROPONINI in the last 72 hours. BNP: Invalid input(s): POCBNP D-Dimer: No results for input(s): DDIMER in the last 72 hours. Hemoglobin A1C: No results for input(s): HGBA1C in the last 72 hours. Fasting Lipid Panel: No results for input(s): CHOL, HDL, LDLCALC, TRIG, CHOLHDL, LDLDIRECT in the last 72 hours. Thyroid Function Tests: Recent Labs    07/28/20 0501  TSH 3.558   Anemia Panel: Recent Labs    07/29/20 0620   FERRITIN 566*     PHYSICAL EXAM General: Well developed, well nourished, in no acute distress HEENT:  Normocephalic and atramatic Neck:  No JVD.  Lungs: Clear bilaterally to auscultation and percussion. Heart: HRRR . Normal S1 and S2 without gallops or murmurs.  Abdomen: Bowel sounds are positive, abdomen soft and non-tender  Msk:  Back normal, normal gait. Normal strength and tone for age. Extremities: No clubbing, cyanosis or edema.   Neuro: Alert and oriented X 3. Psych:  Good affect, responds appropriately  TELEMETRY: A. fib with rapid ventricular response rate  ASSESSMENT AND PLAN: Respiratory distress with COVID-19 positive test (U07.1, COVID-19) with Acute Pneumonia (J12.89, Other viral pneumonia) (If respiratory failure or sepsis present, add as separate assessment)  And atrial fibrillation with rapid ventricular response rate still not well controlled with digoxin metoprolol Cardizem and amiodarone.  Because of catecholamine release due to respiratory failure ventricular rate is still not well controlled.  Patient was moved to ICU.  Principal Problem:   Sepsis ([place]) Active Problems:   Hypertension   Morbid obesity ([place])   Hypercalcemia   Hyperthyroidism   AKI (acute kidney injury) ([place])   COVID-19 virus infection   Atrial fibrillation with RVR ([place])   Hypokalemia    Kanon Novosel A, MD, [facility] 07/30/2020 11:11 AM

## 2020-07-30 NOTE — Progress Notes (Signed)
Resp therapy called to room to assess resp needs at this time.  O2 sats 85%.

## 2020-07-30 NOTE — Progress Notes (Signed)
PT Cancellation Note  Patient Details Name: Courtney Anderson MRN: [id number] DOB: January 17, 1956   Cancelled Treatment:    Reason Eval/Treat Not Completed: Medical issues which prohibited therapy, Patient is now in the ICU and new PT evaluation orders are needed.    [address] [place], [place] DPT 07/30/2020, 1:13 PM

## 2020-07-30 NOTE — Progress Notes (Signed)
PROGRESS NOTE    SHANEEN REESER  MHD:[id number] DOB: Oct 15, 1956 DOA: 07/24/2020 PCP: Guadalupe Maple, MD   Brief Narrative: Shadawn Hanaway Wattis a 64 y.o.femalewith medical history significant formorbid obesity, hypertension, hypothyroidism, dyslipidemia who was brought into the ER by EMS asa "CODE SEPSIS".Patient states that she has not felt well in over a week and complains of a cough, congestion and generalized weakness. She also had intermittent fever and chills. She states that she is usually able to care for herself but over the last 1 week has been unable to because she has felt unwell. Per EMS patient was noted to be tachycardic in the field but they were unable to get an adequate pulse ox reading and so she was placed on a nonrebreather mask. She received 500 cc of fluid in route to the hospital. Upon arrival to the ER she had room air pulse oximetry of 69% and so she remained on the nonrebreather mask with improvement in her pulse oximetry to the low 90s. She denies having any nausea, no vomiting, no changes in her bowel habits, no abdominal pain, no chest pain, no dizziness or lightheadedness. She denies having any falls. Patient is also noted to have multiple areas of redness and excoriation under both breasts, abdominal creases and in between her upper thighs.Redness noted to buttocks but no skin break at this time. Labs on admission show sodium 132, potassium 3.0, chloride 94, bicarb 20, BUN 74, creatinine 2.81, calcium 10.4, AST 60, ALT 54, total protein 8.1, BNP111,lactic acid 3.9, white count 15 with a left shift, hemoglobin 15.9, hematocrit 47.5, MCV 82.9, RDW 14.6, platelet count 436. Patient's COVID-19 PCR test is positive. Chest x-ray reviewed by me shows air bronchograms in the right lung base. Twelve-lead EKG shows rapid A. fib with multiple PVCs and incomplete right bundle branch block and left anterior fascicular block.   Assessment & Plan:   Principal  Problem:   Sepsis ([place]) Active Problems:   Hypertension   Morbid obesity ([place])   Hypercalcemia   Hyperthyroidism   AKI (acute kidney injury) ([place])   COVID-19 virus infection   Atrial fibrillation with RVR ([facility])   Hypokalemia  Acute hypoxic respiratory failure and severe sepsis secondary to COVID-19 pneumonia: Patient meets criteria for severe sepsis based on tachycardia, tachypnea, leukocytosis, acute kidney injury, hypoxia and lactic acid of 3.9.  Lactic acid has improved to 2.5 today. Leukocytosis improved as well.  Patient remains afebrile.  Blood pressure fairly stable. Currently on 30 L high flow nasal oxygen.  Patient feels well.  Able to speak in full sentences.  We will continue Solu-Medrol, remdesivir, zinc, vitamin C, antitussives, bronchodilators.  Patient was encouraged to prone, out of bed to chair, to use incentive spirometry and flutter valve. Actemra/Baricitinib  off label use - patient was told that if COVID-19 pneumonitis gets worse we might potentially use Actemra/ barititinib off label, patient denies any known history of active diverticulitis, tuberculosis or hepatitis, understands the risks and benefits and wants to proceed with Actemra/barititinib treatment.  Will start that.  Will start on 2 mg based on her GFR which is 43 at this point in time.  Once her GFR is greater than 60, this can be increased to 4 mg. Elevated pro calcitonin, suspecting for possible bacterial infection.  Continue Rocephin and Zithromax.  Follow cultures. 9/23 Pt is stable. SpO2: (!) 88 % O2 Flow Rate (L/min): 15 L/min FiO2 (%): 100 % COVID-19 Labs  Recent Labs    07/28/20 0501  07/29/20 0620  FERRITIN 479* 566*  CRP 3.8* 6.9*    Lab Results  Component Value Date   SARSCOV2NAA POSITIVE (A) 07/24/2020   9/24 Pt is stable and alert and her heart rate is high and cardiology is managing pt and is stated on digoxin and amiodarone. Pt is asymptomatic and Denies any complaints or pain or  chest pain or palpitation.   Acute kidney injury: Likely due to ATN due to sepsis.  Creatinine upon presentation at 2.81 and improved to 1.36.  Nephrology on board.  Management per them. Lab Results  Component Value Date   CREATININE 0.77 07/30/2020   CREATININE 0.88 07/29/2020   CREATININE 0.84 07/28/2020  Resolved.   9/25 Pt seen today and for f./u and her hart rate has been resistant and electrolytes has been corrected and has been onoxygen and cardiology has New onset atrial fibrillation with rapid ventricular rate: Rates around 1 10-1 20. Cardiology on board.  She has been started on amiodarone and IV heparin drip.  Echo is pending. Cardiology managing.  Appreciate their help and defer to them. Diltiazem was d/c but HR was still in a.fib rvr and  Therefore has been restarted.  Pt has been in sinus tachycardia and has been started on digoxin and amiodarone.   9/26 Pt has been tachycardiac since past 48 hours, attribute to hypoxia related to covid respiratory failure. Cardiology has been managing and we appreciate consult.  Today after am chart review d/w nurse about starting pt on Bipap. Pt only intermittently tolerated Bipap and was getting agitated and rapid response was called and after immediate eval of pt she was restarted on bipap and transferred to icu . Plan was to monitor pt on bipap and if improvement  On abg cont if not then intubate and d/w family.  Spoke to son today as pt was being transferred to unit. Pt was intubate about 10;'40 am today as she was not tolerating the Bipap.D/W intensivist about transferring pt to their service. Called sone back about change and pt being needed to be intubated for ventilatory support. Son verbalized understanding and plan of care.   Hypokalemia: Resolved but had borderline.  Will give her 1 more dose orally today. Pharmacy consulted to replacement.   Hypercalcemia:  Patient has a history of hypercalcemia in the past and has a known  thyroid nodule Now corrected.  Morbid obesity: Complicates overall prognosis and care.  Weight loss counseling provided.  Possible skin tears: Present on admission and mostly under her skin folds (breast, abdomen and groin). Wound care consulted.  Eating hospitalist discussed with wound care nurse who recommends at least a 5-day course of Diflucan to cover for fungal etiology.   DVT prophylaxis: Heparin Drip Code Status: Full code Family Communication: None at bedside. Disposition Plan: Home.  Status is: Inpatient Dispo: The patient is from: Home              Anticipated d/c is to: Home              Anticipated d/c date is: > 3 days              Patient currently is not medically stable to d/c.  Consultants:  Nephrology : Dr.Kolluru.   Procedures:  ECHO : pending.  Antimicrobials:  Anti-infectives (From admission, onward)   Start     Dose/Rate Route Frequency Ordered Stop   07/25/20 1000  azithromycin (ZITHROMAX) 500 mg in sodium chloride 0.9 % 250 mL IVPB  Status:  Discontinued        500 mg 250 mL/hr over 60 Minutes Intravenous Every 24 hours 07/24/20 1108 07/24/20 1317   07/25/20 1000  remdesivir 100 mg in sodium chloride 0.9 % 100 mL IVPB       "Followed by" Linked Group Details   100 mg 200 mL/hr over 30 Minutes Intravenous Daily 07/24/20 1112 07/28/20 1544   07/24/20 1300  fluconazole (DIFLUCAN) tablet 100 mg  Status:  Discontinued        100 mg Oral Daily 07/24/20 1251 07/25/20 1522   07/24/20 1200  remdesivir 200 mg in sodium chloride 0.9% 250 mL IVPB       "Followed by" Linked Group Details   200 mg 580 mL/hr over 30 Minutes Intravenous Once 07/24/20 1112 07/24/20 1424   07/24/20 1115  cefTRIAXone (ROCEPHIN) 2 g in sodium chloride 0.9 % 100 mL IVPB  Status:  Discontinued        2 g 200 mL/hr over 30 Minutes Intravenous Every 24 hours 07/24/20 1108 07/24/20 1113   07/24/20 1000  cefTRIAXone (ROCEPHIN) 2 g in sodium chloride 0.9 % 100 mL IVPB  Status:   Discontinued        2 g 200 mL/hr over 30 Minutes Intravenous Every 24 hours 07/24/20 0947 07/28/20 1438   07/24/20 1000  azithromycin (ZITHROMAX) 500 mg in sodium chloride 0.9 % 250 mL IVPB  Status:  Discontinued        500 mg 250 mL/hr over 60 Minutes Intravenous Every 24 hours 07/24/20 0947 07/28/20 1438      Subjective: Pt seen today for f/u she is resting bed and paying her bills. Vitals are stable except for RR of 23. Pt seen by nephrology today and home bp meds held.   9/23 Pt is alert and is stable and oriented. PT eval shows pt can go home with [facility]. Pt refuse to prone. Labs shows hypokalemia and hypomagnesemia. 9/24 Pt seen today and is unhappy that she is being disturbed som any times she is fine And wants to rest.NO other issues or complaints.  9/25 Pt was started on amiodarone and digoxin heparin for AC to prevent any strokes per cardiology. 9/26 Pt seen for f/u today and pt seen during rapid for hypoxia and agitation dn transferred to UNIT.    Objective: Vitals:   07/30/20 1100 07/30/20 1200 07/30/20 1221 07/30/20 1300  BP: (!) 141/79 (!) 99/52  (!) 80/33  Pulse: (!) 47 (!) 26  (!) 38  Resp: (!) 36 17  17  Temp: 98.7 F (37.1 C)     TempSrc: Axillary     SpO2: 91% (!) 89% (!) 88% (!) 88%  Weight:      Height:        Intake/Output Summary (Last 24 hours) at 07/30/2020 1401 Last data filed at 07/30/2020 0500 Gross per 24 hour  Intake 595.4 ml  Output 550 ml  Net 45.4 ml   Filed Weights   07/24/20 0900 07/30/20 0500  Weight: 79.4 kg 79.2 kg   Examination: Blood pressure (!) 80/33, pulse (!) 38, temperature 98.7 F (37.1 C), temperature source Axillary, resp. rate 17, height 5\' 5"  (1.651 m), weight 79.2 kg, SpO2 (!) 88 %. General exam: Appears calm and comfortable  Respiratory system: Clear to auscultation. Respiratory effort normal. Cardiovascular system: S1 & S2 heard, RRR. No JVD, murmurs, rubs, gallops or clicks. No pedal  edema. Gastrointestinal system: Abdomen is nondistended, soft and nontender. No organomegaly or masses  felt. Normal bowel sounds heard. Central nervous system: Alert and oriented. No focal neurological deficits. Extremities: Symmetric 5 x 5 power. Skin: No rashes, lesions or ulcers Psychiatry: Judgement and insight appear normal. Mood & affect appropriate.  Data Reviewed: I have personally reviewed following labs and imaging studies  I/O last 3 completed shifts: In: 595.4 [I.V.:595.4] Out: 1950 [Urine:1950] No intake/output data recorded. Lab Results  Component Value Date   CREATININE 0.77 07/30/2020   CREATININE 0.88 07/29/2020   CREATININE 0.84 07/28/2020   CBC: Recent Labs  Lab 07/25/20 0656 07/25/20 0656 07/26/20 0229 07/27/20 0548 07/28/20 0501 07/29/20 0620 07/30/20 0449  WBC 12.7*   < > 12.7* 12.8* 12.4* 20.7* 19.0*  NEUTROABS 11.2*  --  10.9* 10.9* 10.3* 18.4*  --   HGB 15.4*   < > 14.9 14.8 14.9 15.4* 15.4*  HCT 45.3   < > 43.3 44.5 44.3 46.6* 45.4  MCV 81.8   < > 81.5 84.9 85.0 85.2 83.6  PLT 405*   < > 418* 460* 436* 429* 387   < > = values in this interval not displayed.   Basic Metabolic Panel: Recent Labs  Lab 07/25/20 0656 07/25/20 0656 07/26/20 0229 07/27/20 0548 07/28/20 0501 07/29/20 0620 07/30/20 0448  NA 140   < > 141 138 138 140 139  K 3.5   < > 3.0* 3.1* 3.7 3.7 3.8  CL 108   < > 109 105 104 104 101  CO2 23   < > 24 22 23 26 27   GLUCOSE 141*   < > 163* 180* 183* 132* 143*  BUN 98*   < > 59* 29* 18 16 18   CREATININE 1.36*   < > 0.96 0.84 0.84 0.88 0.77  CALCIUM 10.2   < > 10.1 9.9 9.8 9.8 9.4  MG 2.4   < > 2.1 1.8 2.1 1.7 1.9  PHOS 3.3  --  1.8* 1.9* 2.0* 2.5  --    < > = values in this interval not displayed.   GFR: Estimated Creatinine Clearance: 72.9 mL/min (by C-G formula based on SCr of 0.77 mg/dL). Liver Function Tests: Recent Labs  Lab 07/25/20 0656 07/26/20 0229 07/27/20 0548 07/28/20 0501 07/29/20 0620  AST 48* 35 29 27  34  ALT 45* 39 35 31 27  ALKPHOS 63 60 62 59 73  BILITOT 0.7 0.6 0.5 0.4 0.8  PROT 7.0 6.7 7.0 6.9 7.0  ALBUMIN 2.3* 2.4* 2.5* 2.5* 2.6*   Coagulation Profile: Recent Labs  Lab 07/24/20 0914 07/25/20 0656  INR 1.3* 1.4*   Thyroid Function Tests: Recent Labs    07/28/20 0501  TSH 3.558  FREET4 0.94   Anemia Panel: Recent Labs    07/28/20 0501 07/29/20 0620  FERRITIN 479* 566*   Sepsis Labs: Recent Labs  Lab 07/24/20 0914 07/24/20 1145 07/24/20 1425 07/25/20 0656 07/25/20 1521 07/28/20 0501  PROCALCITON  --   --  1.35 1.39  --  0.10  LATICACIDVEN 3.9* 2.5*  --   --  2.6*  --     Recent Results (from the past 240 hour(s))  Urine culture     Status: Abnormal   Collection Time: 07/24/20  9:12 AM   Specimen: Urine, Random  Result Value Ref Range Status   Specimen Description   Final    URINE, RANDOM Performed at [facility], [address], [place], [place] [address]    Special Requests   Final    NONE Performed at [facility], [place], [place] [address]    Culture (A)  Final    <10,000 COLONIES/mL >=100,000 COLONIES/mL Performed at [facility] [address] [place], [place]  [address]    Report Status 07/25/2020 FINAL  Final  Blood culture (routine single)     Status: None   Collection Time: 07/24/20  9:14 AM   Specimen: BLOOD LEFT ARM  Result Value Ref Range Status   Specimen Description BLOOD LEFT ARM  Final   Special Requests   Final    BOTTLES DRAWN AEROBIC AND ANAEROBIC Blood Culture adequate volume   Culture   Final    NO GROWTH 5 DAYS Performed at [facility], [address] [place], [place] [address]    Report Status 07/29/2020 FINAL  Final  SARS Coronavirus 2 by RT PCR (hospital order, performed in [place] hospital lab) Nasopharyngeal Nasopharyngeal Swab     Status: Abnormal   Collection Time: 07/24/20  9:16 AM   Specimen: Nasopharyngeal Swab  Result Value Ref Range Status    SARS Coronavirus 2 POSITIVE (A) NEGATIVE Final    Comment: RESULT CALLED TO, READ BACK BY AND VERIFIED WITH:  SAMANTHA HAMILTON AT 3818 07/24/20 SDR (NOTE) SARS-CoV-2 target nucleic acids are DETECTED  SARS-CoV-2 RNA is generally detectable in upper respiratory specimens  during the acute phase of infection.  Positive results are indicative  of the presence of the identified virus, but do not rule out bacterial infection or co-infection with other pathogens not detected by the test.  Clinical correlation with patient history and  other diagnostic information is necessary to determine patient infection status.  The expected result is negative.  Fact Sheet for Patients:   StrictlyIdeas.no   Fact Sheet for Healthcare Providers:   BankingDealers.co.za    This test is not yet approved or cleared by the [place] FDA and  has been authorized for detection and/or diagnosis of SARS-CoV-2 by FDA under an Emergency Use Authorization (EUA).  This EUA will remain in effect (meaning thi s test can be used) for the duration of  the COVID-19 declaration under Section 564(b)(1) of the Act, 21 U.S.C. section 360-bbb-3(b)(1), unless the authorization is terminated or revoked sooner.  Performed at [facility], [address] [place], [place] [address]   MRSA PCR Screening     Status: None   Collection Time: 07/30/20 11:08 AM   Specimen: Nasopharyngeal  Result Value Ref Range Status   MRSA by PCR NEGATIVE NEGATIVE Final    Comment:        The GeneXpert MRSA Assay (FDA approved for NASAL specimens only), is one component of a comprehensive MRSA colonization surveillance program. It is not intended to diagnose MRSA infection nor to guide or monitor treatment for MRSA infections. Performed at [facility], [address] [place], [place] [address]     Radiology Studies: DG Chest 1 View  Result Date:  07/29/2020 CLINICAL DATA:  Initial evaluation for acute tachycardia. History of COVID pneumonia. EXAM: CHEST  1 VIEW COMPARISON:  Prior CTA from 07/28/2020 and radiograph from 07/24/2020. FINDINGS: Transverse heart size stable. Mediastinal silhouette within normal limits. Lungs mildly hypoinflated. Severe diffuse bilateral airspace disease, right greater than left, progressed and worsened as compared to prior radiograph from 07/24/2020. Scattered air bronchograms noted at the lung bases bilaterally. Underlying pulmonary vascular congestion without overt pulmonary edema. No visible pleural effusion. No pneumothorax. Osseous structures unchanged. IMPRESSION: Severe diffuse bilateral airspace disease, right greater than left,  progressed and worsened as compared to previous, concerning for worsened multifocal pneumonia. Electronically Signed   By: Jeannine Boga M.D.   On: 07/29/2020 05:47   DG Abd 1 View  Result Date: 07/30/2020 CLINICAL DATA:  COVID positive patient.  OG tube evaluation EXAM: ABDOMEN - 1 VIEW COMPARISON:  None. FINDINGS: Enteric tube tip and side-port project within the distal esophagus, recommend advancement. Patchy airspace opacities lower lungs bilaterally. IMPRESSION: Enteric tube tip and side-port project within the distal esophagus, recommend advancement. These results will be called to the ordering clinician or representative by the Radiologist Assistant, and communication documented in the PACS or [employer]. Electronically Signed   By: Lovey Newcomer M.D.   On: 07/30/2020 13:11   DG Chest Port 1 View  Result Date: 07/30/2020 CLINICAL DATA:  Worsening COVID pneumonia.  Intubation. EXAM: PORTABLE CHEST 1 VIEW COMPARISON:  07/29/2020 FINDINGS: The endotracheal tube is 4.3 cm above the carina. The left IJ catheter tip is in the distal SVC near the cavoatrial junction. The NG tube is coursing down the esophagus and into the stomach. The tip is in the fundal region and the proximal  port is near the GE junction. This could be advanced several cm. Persistent diffuse interstitial and airspace process in the lungs but slight improved aeration after intubation. No pneumothorax. IMPRESSION: 1. Persistent infiltrates but slight improved aeration after intubation. 2. ET tube and left IJ catheter in good position. 3. NG tube tip in the fundal region and proximal port is near the GE junction. This could be advanced several cm. Electronically Signed   By: Marijo Sanes M.D.   On: 07/30/2020 13:11   Scheduled Meds:  albuterol  2 puff Inhalation Q6H   vitamin C  500 mg Oral Daily   aspirin EC  81 mg Oral Daily   baricitinib  4 mg Oral Daily   Chlorhexidine Gluconate Cloth  6 each Topical Daily   cholecalciferol  2,000 Units Oral Daily   digoxin  0.25 mg Intravenous Daily   Gerhardt's butt cream   Topical TID   heparin  1,100 Units Intravenous Once   metoprolol succinate  100 mg Oral Daily   multivitamin with minerals  1 tablet Oral Daily   nystatin   Topical TID   pneumococcal 23 valent vaccine  0.5 mL Intramuscular Tomorrow-1000   predniSONE  50 mg Oral Daily   zinc sulfate  220 mg Oral Daily   Continuous Infusions:  amiodarone Stopped (07/30/20 1056)   diltiazem (CARDIZEM) infusion 15 mg/hr (07/30/20 1242)   fentaNYL infusion INTRAVENOUS 200 mcg/hr (07/30/20 1155)   heparin Stopped (07/30/20 1030)   midazolam     norepinephrine (LEVOPHED) Adult infusion 2 mcg/min (07/30/20 1321)   propofol (DIPRIVAN) infusion 10 mcg/kg/min (07/30/20 1301)    LOS: 6 days   Para Skeans, MD [facility] Pager [phone] If 7PM-7AM, please contact night-coverage [email or web address] Password [email or web address] 07/30/2020, 2:01 PM

## 2020-07-31 ENCOUNTER — Inpatient Hospital Stay: Payer: Medicare Other

## 2020-07-31 LAB — MAGNESIUM: Magnesium: 2.4 mg/dL (ref 1.7–2.4)

## 2020-07-31 LAB — BASIC METABOLIC PANEL
Anion gap: 11 (ref 5–15)
BUN: 32 mg/dL — ABNORMAL HIGH (ref 8–23)
CO2: 28 mmol/L (ref 22–32)
Calcium: 10.1 mg/dL (ref 8.9–10.3)
Chloride: 99 mmol/L (ref 98–111)
Creatinine, Ser: 2.06 mg/dL — ABNORMAL HIGH (ref 0.44–1.00)
GFR calc Af Amer: 29 mL/min — ABNORMAL LOW (ref >60)
GFR calc non Af Amer: 25 mL/min — ABNORMAL LOW (ref >60)
Glucose, Bld: 145 mg/dL — ABNORMAL HIGH (ref 70–99)
Potassium: 4.7 mmol/L (ref 3.5–5.1)
Sodium: 138 mmol/L (ref 135–145)

## 2020-07-31 LAB — HEPARIN LEVEL (UNFRACTIONATED): Heparin Unfractionated: 0.44 IU/mL (ref 0.30–0.70)

## 2020-07-31 LAB — PTH-RELATED PEPTIDE: PTH-related peptide: <2 pmol/L

## 2020-07-31 MED ORDER — SODIUM CHLORIDE 0.9 % IV SOLN
0.0000 ug/h | INTRAVENOUS | Status: DC
  Administered 2020-07-31: 200 ug/h via INTRAVENOUS
  Filled 2020-07-31 (×2): qty 50

## 2020-07-31 MED ORDER — CHLORHEXIDINE GLUCONATE 0.12% ORAL RINSE (MEDLINE KIT)
15.0000 mL | Freq: Two times a day (BID) | OROMUCOSAL | Status: DC
  Administered 2020-07-31 – 2020-08-10 (×21): 15 mL via OROMUCOSAL

## 2020-07-31 MED ORDER — VASOPRESSIN 20 UNITS/100 ML INFUSION FOR SHOCK
0.0000 [IU]/min | INTRAVENOUS | Status: DC
  Administered 2020-07-31 – 2020-08-03 (×7): 0.03 [IU]/min via INTRAVENOUS
  Filled 2020-07-31 (×9): qty 100

## 2020-07-31 MED ORDER — NOREPINEPHRINE 16 MG/250ML-% IV SOLN
0.0000 ug/min | INTRAVENOUS | Status: DC
  Administered 2020-07-31: 20 ug/min via INTRAVENOUS
  Filled 2020-07-31 (×2): qty 250

## 2020-07-31 MED ORDER — ORAL CARE MOUTH RINSE
15.0000 mL | OROMUCOSAL | Status: DC
  Administered 2020-07-31 – 2020-08-10 (×99): 15 mL via OROMUCOSAL

## 2020-07-31 MED ORDER — AMIODARONE HCL 200 MG PO TABS
200.0000 mg | ORAL_TABLET | Freq: Two times a day (BID) | ORAL | Status: DC
  Administered 2020-07-31 – 2020-08-10 (×21): 200 mg
  Filled 2020-07-31 (×21): qty 1

## 2020-07-31 MED ORDER — FENTANYL 2500MCG IN NS 250ML (10MCG/ML) PREMIX INFUSION
0.0000 ug/h | INTRAVENOUS | Status: DC
  Administered 2020-08-01: 300 ug/h via INTRAVENOUS
  Administered 2020-08-01 (×2): 400 ug/h via INTRAVENOUS
  Administered 2020-08-02 – 2020-08-06 (×9): 200 ug/h via INTRAVENOUS
  Administered 2020-08-06: 100 ug/h via INTRAVENOUS
  Administered 2020-08-07 (×2): 200 ug/h via INTRAVENOUS
  Administered 2020-08-09: 400 ug/h via INTRAVENOUS
  Administered 2020-08-09: 150 ug/h via INTRAVENOUS
  Filled 2020-07-31 (×17): qty 250

## 2020-07-31 MED ORDER — DEXMEDETOMIDINE HCL IN NACL 400 MCG/100ML IV SOLN
0.4000 ug/kg/h | INTRAVENOUS | Status: DC
  Administered 2020-07-31: 0.8 ug/kg/h via INTRAVENOUS
  Administered 2020-07-31: 0.6 ug/kg/h via INTRAVENOUS
  Administered 2020-07-31: 0.8 ug/kg/h via INTRAVENOUS
  Administered 2020-07-31 – 2020-08-02 (×15): 1.2 ug/kg/h via INTRAVENOUS
  Administered 2020-08-03: 1 ug/kg/h via INTRAVENOUS
  Administered 2020-08-03 (×3): 1.2 ug/kg/h via INTRAVENOUS
  Administered 2020-08-03: 1 ug/kg/h via INTRAVENOUS
  Administered 2020-08-03 (×2): 1.2 ug/kg/h via INTRAVENOUS
  Administered 2020-08-03 – 2020-08-05 (×8): 1 ug/kg/h via INTRAVENOUS
  Administered 2020-08-05: 0.8 ug/kg/h via INTRAVENOUS
  Administered 2020-08-05 (×2): 0.9 ug/kg/h via INTRAVENOUS
  Administered 2020-08-05: 0.7 ug/kg/h via INTRAVENOUS
  Administered 2020-08-06 (×6): 0.9 ug/kg/h via INTRAVENOUS
  Administered 2020-08-07 (×3): 1 ug/kg/h via INTRAVENOUS
  Administered 2020-08-07: 0.6 ug/kg/h via INTRAVENOUS
  Administered 2020-08-07: 1 ug/kg/h via INTRAVENOUS
  Administered 2020-08-07: 0.6 ug/kg/h via INTRAVENOUS
  Administered 2020-08-08 (×2): 1.2 ug/kg/h via INTRAVENOUS
  Administered 2020-08-08: 1 ug/kg/h via INTRAVENOUS
  Administered 2020-08-08: 1.2 ug/kg/h via INTRAVENOUS
  Administered 2020-08-08: 1 ug/kg/h via INTRAVENOUS
  Administered 2020-08-08: 0.6 ug/kg/h via INTRAVENOUS
  Administered 2020-08-09 (×4): 1.2 ug/kg/h via INTRAVENOUS
  Administered 2020-08-09: 1 ug/kg/h via INTRAVENOUS
  Administered 2020-08-09: 0.6 ug/kg/h via INTRAVENOUS
  Administered 2020-08-10 (×2): 0.8 ug/kg/h via INTRAVENOUS
  Administered 2020-08-10: 0.735 ug/kg/h via INTRAVENOUS
  Filled 2020-07-31 (×68): qty 100

## 2020-07-31 MED ORDER — VECURONIUM BROMIDE 10 MG IV SOLR
10.0000 mg | INTRAVENOUS | Status: DC | PRN
  Administered 2020-08-04: 10 mg via INTRAVENOUS
  Filled 2020-07-31: qty 10

## 2020-07-31 MED ORDER — APIXABAN 5 MG PO TABS
5.0000 mg | ORAL_TABLET | Freq: Two times a day (BID) | ORAL | Status: DC
  Administered 2020-07-31 – 2020-08-15 (×29): 5 mg
  Filled 2020-07-31 (×29): qty 1

## 2020-07-31 NOTE — Progress Notes (Signed)
Spoke with Dr. Candiss Norse regarding patients urine output. He will be in to evaluate patient. Per Dr. Loni Muse we will not be advancing OG tube. MD verified placement per xray.

## 2020-07-31 NOTE — Progress Notes (Signed)
CRITICAL CARE PROGRESS NOTE    Name: LAURIELLE SELMON MRN: [id number] DOB: January 07, 1956     LOS: 7   SUBJECTIVE FINDINGS & SIGNIFICANT EVENTS    Patient description:  Reason for Consult: Severe respiratory distress and patient with COVID-19 pneumonia. Referring Physician: Florina Ou, MD  [name] is an 64 y.o. female.  HPI: Patient is a 64 year old female with history noted below, who was admitted to [facility] on 2020-07-24 brought in via EMS as a "code sepsis".  Patient subsequently tested positive for COVID-19.  She presented with oxygen saturations of 69% on room air and had to be placed on nonrebreather mask to achieve oxygen saturations in the 90s.  She was subsequently admitted to the Covid unit for management of COVID-19 pneumonia.  She was noted to have acute kidney injury she was treated with remdesivir, zinc, vitamin C and antitussives.  She was placed empirically on Rocephin and azithromycin.  She continued to have increased FiO2 requirements.  She was refusing to self prone.  She had atrial fibrillation and was seen by cardiology to assist in management.   07/31/20- patient is on 90% FiO2, will modify meds as patient is on metoprolol and levophed as well as cardizem.  Will d/c propofol to improve SBP and remove arythmogenic vasopressor if able.  Plan to have CVP trending and diuresis today.   Goals of care discussion today - patient made DNR per sister and son of patient. I had 2 separate phone meetings with son and separate conversation with sister today to explain patient is worse with shock and renal failure requiring multiple pressors.   Lines/tubes : Airway 7.5 mm (Active)  Secured at (cm) 23 cm 07/31/20 0806  Measured From Lips 07/31/20 [place] 07/31/20 0806  Secured By  [employer] 07/31/20 0806  Tube Holder Repositioned Yes 07/31/20 0806  Cuff Pressure (cm H2O) 26 cm H2O 07/31/20 0806  Site Condition Dry 07/31/20 0806     CVC Triple Lumen 07/30/20 Left Internal jugular (Active)  Indication for Insertion or Continuance of Line Vasoactive infusions 07/30/20 2010  Site Assessment Clean;Dry;Intact 07/30/20 2010  Proximal Lumen Status Infusing 07/30/20 2010  Medial Lumen Status Infusing 07/30/20 2010  Distal Lumen Status Infusing 07/30/20 2010  Dressing Type Transparent;Occlusive 07/30/20 2010  Dressing Status Clean;Dry;Intact 07/30/20 2010  Line Care Connections checked and tightened 07/30/20 2010  Dressing Change Due 08/06/20 07/30/20 2010     NG/OG Tube [facility] mouth Xray Documented cm marking at nare/ corner of mouth 75 cm (Active)  Cm Marking at Nare/Corner of Mouth (if applicable) 75 cm 34/19/62 2010  Site Assessment Clean;Dry;Intact 07/30/20 2010  Ongoing Placement Verification No change in cm markings or external length of tube from initial placement;No acute changes, not attributed to clinical condition;No change in respiratory status;Xray 07/30/20 2010  Status Suction-low intermittent 07/30/20 2010  Amount of suction 80 mmHg 07/30/20 2010  Drainage Appearance [employer];Coffee ground 07/30/20 2010     Urethral Catheter Olen Pel Double-lumen;Latex 16 Fr. (Active)  Indication for Insertion or Continuance of Catheter Unstable critically ill patients first 24-48 hours (See Criteria) 07/31/20 0752  Site Assessment Clean;Intact 07/30/20 2000  Catheter Maintenance Bag below level of bladder;Catheter secured;Drainage bag/tubing not touching floor;Insertion date on drainage bag;No dependent loops;Seal intact 07/30/20 2000  Collection Container Standard drainage bag 07/30/20 2000  Securement Method Securing device (Describe) 07/30/20 2000  Urinary Catheter Interventions (if applicable) Unclamped 22/97/98 2000  Output (mL) 75 mL 07/31/20  0600  Microbiology/Sepsis markers: Results for orders placed or performed during the hospital encounter of 07/24/20  Urine culture     Status: Abnormal   Collection Time: 07/24/20  9:12 AM   Specimen: Urine, Random  Result Value Ref Range Status   Specimen Description   Final    URINE, RANDOM Performed at [facility], [address] [place], [place] [address]    Special Requests   Final    NONE Performed at [facility], [address], [place], [place] [address]    Culture (A)  Final    <10,000 COLONIES/mL >=100,000 COLONIES/mL Performed at [facility], [place] [address] [place], [place] [address]    Report Status 07/25/2020 FINAL  Final  Blood culture (routine single)     Status: None   Collection Time: 07/24/20  9:14 AM   Specimen: BLOOD LEFT ARM  Result Value Ref Range Status   Specimen Description BLOOD LEFT ARM  Final   Special Requests   Final    BOTTLES DRAWN AEROBIC AND ANAEROBIC Blood Culture adequate volume   Culture   Final    NO GROWTH 5 DAYS Performed at [facility], [address] [place], [place] [address]    Report Status 07/29/2020 FINAL  Final  SARS Coronavirus 2 by RT PCR (hospital order, performed in [place] hospital lab) Nasopharyngeal Nasopharyngeal Swab     Status: Abnormal   Collection Time: 07/24/20  9:16 AM   Specimen: Nasopharyngeal Swab  Result Value Ref Range Status   SARS Coronavirus 2 POSITIVE (A) NEGATIVE Final    Comment: RESULT CALLED TO, READ BACK BY AND VERIFIED WITH:  SAMANTHA HAMILTON AT 5366 07/24/20 SDR (NOTE) SARS-CoV-2 target nucleic acids are DETECTED  SARS-CoV-2 RNA is generally detectable in upper respiratory specimens  during the acute phase of infection.  Positive results are indicative  of the presence of the identified virus, but do not rule out bacterial infection or co-infection with other pathogens not detected by the test.  Clinical correlation with patient history and    other diagnostic information is necessary to determine patient infection status.  The expected result is negative.  Fact Sheet for Patients:   StrictlyIdeas.no   Fact Sheet for Healthcare Providers:   BankingDealers.co.za    This test is not yet approved or cleared by the [place] FDA and  has been authorized for detection and/or diagnosis of SARS-CoV-2 by FDA under an Emergency Use Authorization (EUA).  This EUA will remain in effect (meaning thi s test can be used) for the duration of  the COVID-19 declaration under Section 564(b)(1) of the Act, 21 U.S.C. section 360-bbb-3(b)(1), unless the authorization is terminated or revoked sooner.  Performed at [facility], [address] [place], [place] [address]   MRSA PCR Screening     Status: None   Collection Time: 07/30/20 11:08 AM   Specimen: Nasopharyngeal  Result Value Ref Range Status   MRSA by PCR NEGATIVE NEGATIVE Final    Comment:        The GeneXpert MRSA Assay (FDA approved for NASAL specimens only), is one component of a comprehensive MRSA colonization surveillance program. It is not intended to diagnose MRSA infection nor to guide or monitor treatment for MRSA infections. Performed at [facility], [address], [place], [place] [address]     Anti-infectives:  Anti-infectives (From admission, onward)   Start     Dose/Rate Route Frequency Ordered Stop   07/25/20 1000  azithromycin (ZITHROMAX) 500 mg in sodium chloride 0.9 % 250 mL IVPB  Status:  Discontinued        500 mg 250 mL/hr over 60 Minutes Intravenous Every 24 hours 07/24/20 1108 07/24/20 1317   07/25/20 1000  remdesivir 100 mg in sodium chloride 0.9 % 100 mL IVPB       "Followed by" Linked Group Details   100 mg 200 mL/hr over 30 Minutes Intravenous Daily 07/24/20 1112 07/28/20 1544   07/24/20 1300  fluconazole (DIFLUCAN) tablet 100 mg  Status:  Discontinued        100 mg  Oral Daily 07/24/20 1251 07/25/20 1522   07/24/20 1200  remdesivir 200 mg in sodium chloride 0.9% 250 mL IVPB       "Followed by" Linked Group Details   200 mg 580 mL/hr over 30 Minutes Intravenous Once 07/24/20 1112 07/24/20 1424   07/24/20 1115  cefTRIAXone (ROCEPHIN) 2 g in sodium chloride 0.9 % 100 mL IVPB  Status:  Discontinued        2 g 200 mL/hr over 30 Minutes Intravenous Every 24 hours 07/24/20 1108 07/24/20 1113   07/24/20 1000  cefTRIAXone (ROCEPHIN) 2 g in sodium chloride 0.9 % 100 mL IVPB  Status:  Discontinued        2 g 200 mL/hr over 30 Minutes Intravenous Every 24 hours 07/24/20 0947 07/28/20 1438   07/24/20 1000  azithromycin (ZITHROMAX) 500 mg in sodium chloride 0.9 % 250 mL IVPB  Status:  Discontinued        500 mg 250 mL/hr over 60 Minutes Intravenous Every 24 hours 07/24/20 0947 07/28/20 1438       Consults: Treatment Team:  Yolonda Kida, MD Para Skeans, MD Dionisio David, MD     PAST MEDICAL HISTORY   Past Medical History:  Diagnosis Date  . Allergy   . Elevated transaminase level   . Hyperlipidemia   . Hypertension   . Obesity      SURGICAL HISTORY   Past Surgical History:  Procedure Laterality Date  . BREAST SURGERY    . ECTOPIC PREGNANCY SURGERY       FAMILY HISTORY   Family History  Problem Relation Age of Onset  . Hypertension Father   . Stroke Father   . Hypertension Mother   . Arthritis Mother   . Aneurysm Sister   . Hypertension Sister   . Hypertension Brother   . Hypertension Sister   . Hypertension Brother      SOCIAL HISTORY   Social History   Tobacco Use  . Smoking status: Former Research scientist (life sciences)  . Smokeless tobacco: Never Used  Substance Use Topics  . Alcohol use: Yes    Alcohol/week: 0.0 standard drinks    Comment: on occasion  . Drug use: No     MEDICATIONS   Current Medication:  Current Facility-Administered Medications:  .  acetaminophen (TYLENOL) tablet 650 mg, 650 mg, Oral, Q6H PRN, 650 mg at  07/29/20 2333 **OR** acetaminophen (TYLENOL) suppository 650 mg, 650 mg, Rectal, Q6H PRN, Agbata, Tochukwu, MD .  [COMPLETED] amiodarone (NEXTERONE) 1.8 mg/mL load via infusion 150 mg, 150 mg, Intravenous, Once, 150 mg at 07/27/20 2146 **FOLLOWED BY** [EXPIRED] amiodarone (NEXTERONE PREMIX) 360-4.14 MG/200ML-% (1.8 mg/mL) IV infusion, 60 mg/hr, Intravenous, Continuous, Last Rate: 33.3 mL/hr at 07/27/20 2146, 60 mg/hr at 07/27/20 2146 **FOLLOWED BY** amiodarone (NEXTERONE PREMIX) 360-4.14 MG/200ML-% (1.8 mg/mL) IV infusion, 30 mg/hr, Intravenous, Continuous, Sharion Settler, NP, Stopped at 07/30/20 1056 .  ascorbic acid (VITAMIN C) tablet 500 mg, 500 mg, Oral, Daily, Agbata, Tochukwu, MD, 500 mg at 07/31/20 0815 .  aspirin EC tablet 81 mg, 81 mg, Oral, Daily, Agbata, Tochukwu, MD, 81 mg at 07/31/20 0815 .  baricitinib (OLUMIANT) tablet 4 mg, 4 mg, Oral, Daily, Benita Gutter, RPH, 4 mg at 07/31/20 0815 .  chlorhexidine gluconate (MEDLINE KIT) (PERIDEX) 0.12 % solution 15 mL, 15 mL, Mouth Rinse, BID, Akingbade, Jimi O, MD, 15 mL at 07/31/20 0819 .  Chlorhexidine Gluconate Cloth 2 % PADS 6 each, 6 each, Topical, Daily, Tyler Pita, MD, 6 each at 07/30/20 1100 .  cholecalciferol (VITAMIN D3) tablet 2,000 Units, 2,000 Units, Oral, Daily, Agbata, Tochukwu, MD, 2,000 Units at 07/31/20 0816 .  digoxin (LANOXIN) 0.25 MG/ML injection 0.25 mg, 0.25 mg, Intravenous, Daily, Neoma Laming A, MD, 0.25 mg at 07/31/20 0817 .  diltiazem (CARDIZEM) 125 mg in dextrose 5% 125 mL (1 mg/mL) infusion, 5-15 mg/hr, Intravenous, Titrated, Florina Ou V, MD, Last Rate: 15 mL/hr at 07/31/20 0700, 15 mg/hr at 07/31/20 0700 .  fentaNYL citrate (PF) (SUBLIMAZE) 2,500 mcg in sodium chloride 0.9 % 250 mL (10 mcg/mL) infusion, 0-400 mcg/hr, Intravenous, Continuous, Tyler Pita, MD, Held at 07/31/20 [phone] .  Gerhardt's butt cream, , Topical, TID, Agbata, Tochukwu, MD, Given at 07/31/20 0818 .  guaiFENesin-dextromethorphan  (ROBITUSSIN DM) 100-10 MG/5ML syrup 10 mL, 10 mL, Oral, Q4H PRN, Agbata, Tochukwu, MD .  haloperidol lactate (HALDOL) injection 5 mg, 5 mg, Intravenous, Q6H PRN, Posey Pronto, Ekta V, MD .  heparin ADULT infusion 100 units/mL (25000 units/267m sodium chloride 0.45%), 1,150 Units/hr, Intravenous, Continuous, PFlorina OuV, MD, Last Rate: 11.5 mL/hr at 07/31/20 0700, 1,150 Units/hr at 07/31/20 0700 .  heparin bolus via infusion 1,100 Units, 1,100 Units, Intravenous, Once, PFlorina OuV, MD .  HYDROcodone-acetaminophen (NORCO/VICODIN) 5-325 MG per tablet 1 tablet, 1 tablet, Oral, Q6H PRN, OLang Snow NP, 1 tablet at 07/25/20 0143 .  MEDLINE mouth rinse, 15 mL, Mouth Rinse, 10 times per day, ACheryll Dessert MD, 15 mL at 07/31/20 0612 .  metoprolol succinate (TOPROL-XL) 24 hr tablet 100 mg, 100 mg, Oral, Daily, PFlorina OuV, MD, 100 mg at 07/30/20 0900 .  metoprolol tartrate (LOPRESSOR) injection 5 mg, 5 mg, Intravenous, Q2H PRN, Callwood, Dwayne D, MD, 5 mg at 07/30/20 0920 .  midazolam (VERSED) 50 mg/50 mL (1 mg/mL) premix infusion, 0.5-10 mg/hr, Intravenous, Continuous, GTyler Pita MD .  multivitamin with minerals tablet 1 tablet, 1 tablet, Oral, Daily, Agbata, Tochukwu, MD, 1 tablet at 07/31/20 0816 .  norepinephrine (LEVOPHED) 16 mg in 2546mpremix infusion, 0-40 mcg/min, Intravenous, Titrated, Dajah Fischman, MD .  norepinephrine (LEVOPHED) 58m20mn 250m1memix infusion, 0-40 mcg/min, Intravenous, Titrated, GonzTyler Pita, Last Rate: 93.8 mL/hr at 07/31/20 0700, 25 mcg/min at 07/31/20 0700 .  nystatin (MYCOSTATIN/NYSTOP) topical powder, , Topical, TID, PahwDarliss Cheney, Given at 07/31/20 0818 .  ondansetron (ZOFRAN) tablet 4 mg, 4 mg, Oral, Q6H PRN **OR** ondansetron (ZOFRAN) injection 4 mg, 4 mg, Intravenous, Q6H PRN, Agbata, Tochukwu, MD .  pneumococcal 23 valent vaccine (PNEUMOVAX-23) injection 0.5 mL, 0.5 mL, Intramuscular, Tomorrow-1000, Pahwani, Ravi, MD .   [COMPLETED] methylPREDNISolone sodium succinate (SOLU-MEDROL) 40 mg/mL injection 39.6 mg, 0.5 mg/kg, Intravenous, Q12H, 39.6 mg at 07/27/20 0542 **FOLLOWED BY** predniSONE (DELTASONE) tablet 50 mg, 50 mg, Oral, Daily, Agbata, Tochukwu, MD, 50 mg at 07/30/20 0901 .  propofol (DIPRIVAN) 1000 MG/100ML infusion, 5-80 mcg/kg/min, Intravenous, Titrated, GonzPatsey Berthold  Lolita Cram, MD, Last Rate: 14.26 mL/hr at 07/31/20 0700, 30 mcg/kg/min at 07/31/20 0700 .  zinc sulfate capsule 220 mg, 220 mg, Oral, Daily, Agbata, Tochukwu, MD, 220 mg at 07/31/20 0816    ALLERGIES   Codeine    REVIEW OF SYSTEMS    Unable to perform due to Sedation on MV  PHYSICAL EXAMINATION   Vital Signs: Temp:  [96.4 F (35.8 C)-98.7 F (37.1 C)] 98.2 F (36.8 C) (09/27 0730) Pulse Rate:  [26-142] 85 (09/27 0730) Resp:  [0-36] 18 (09/27 0730) BP: (71-141)/(33-82) 85/69 (09/27 0645) SpO2:  [69 %-100 %] 95 % (09/27 0730) FiO2 (%):  [90 %-100 %] 90 % (09/27 0806) Weight:  [108.9 kg] 108.9 kg (09/27 0215)  GENERAL:intubated on MV age appropriate HEAD: Normocephalic, atraumatic.  EYES: Pupils equal, round, reactive to light.  No scleral icterus.  MOUTH: Moist mucosal membrane. NECK: Supple. No thyromegaly. No nodules. No JVD.  PULMONARY: crackles at bases difficult to appreciate due to MV CARDIOVASCULAR: S1 and S2. Regular rate and rhythm. No murmurs, rubs, or gallops.  GASTROINTESTINAL: Soft, nontender, non-distended. No masses. Positive bowel sounds. No hepatosplenomegaly.  MUSCULOSKELETAL: No swelling, clubbing, or edema.  NEUROLOGIC: Mild distress due to acute illness SKIN:intact,warm,dry   PERTINENT DATA     Infusions: . amiodarone Stopped (07/30/20 1056)  . diltiazem (CARDIZEM) infusion 15 mg/hr (07/31/20 0700)  . fentaNYL infusion INTRAVENOUS Stopped (07/31/20 0551)  . heparin 1,150 Units/hr (07/31/20 0700)  . midazolam    . norepinephrine (LEVOPHED) Adult infusion    . norepinephrine (LEVOPHED) Adult  infusion 25 mcg/min (07/31/20 0700)  . propofol (DIPRIVAN) infusion 30 mcg/kg/min (07/31/20 0700)   Scheduled Medications: . vitamin C  500 mg Oral Daily  . aspirin EC  81 mg Oral Daily  . baricitinib  4 mg Oral Daily  . chlorhexidine gluconate (MEDLINE KIT)  15 mL Mouth Rinse BID  . Chlorhexidine Gluconate Cloth  6 each Topical Daily  . cholecalciferol  2,000 Units Oral Daily  . digoxin  0.25 mg Intravenous Daily  . Gerhardt's butt cream   Topical TID  . heparin  1,100 Units Intravenous Once  . mouth rinse  15 mL Mouth Rinse 10 times per day  . metoprolol succinate  100 mg Oral Daily  . multivitamin with minerals  1 tablet Oral Daily  . nystatin   Topical TID  . pneumococcal 23 valent vaccine  0.5 mL Intramuscular Tomorrow-1000  . predniSONE  50 mg Oral Daily  . zinc sulfate  220 mg Oral Daily   PRN Medications: acetaminophen **OR** acetaminophen, guaiFENesin-dextromethorphan, haloperidol lactate, HYDROcodone-acetaminophen, metoprolol tartrate, ondansetron **OR** ondansetron (ZOFRAN) IV Hemodynamic parameters:   Intake/Output: 09/26 0701 - 09/27 0700 In: 3885.8 [I.V.:3885.8] Out: 150 [Urine:150]  Ventilator  Settings: Vent Mode: PRVC FiO2 (%):  [90 %-100 %] 90 % Set Rate:  [30 bmp] 30 bmp Vt Set:  [430 mL] 430 mL PEEP:  [10 cmH20-15 cmH20] 15 cmH20 Plateau Pressure:  [24 cmH20-32 cmH20] 30 cmH20     LAB RESULTS:  Basic Metabolic Panel: Recent Labs  Lab 07/25/20 0656 07/25/20 0656 07/26/20 0229 07/26/20 0229 07/27/20 0548 07/27/20 0548 07/28/20 0501 07/28/20 0501 07/29/20 0620 07/30/20 0448  NA 140   < > 141  --  138  --  138  --  140 139  K 3.5   < > 3.0*   < > 3.1*   < > 3.7   < > 3.7 3.8  CL 108   < > 109  --  105  --  104  --  104 101  CO2 23   < > 24  --  22  --  23  --  26 27  GLUCOSE 141*   < > 163*  --  180*  --  183*  --  132* 143*  BUN 98*   < > 59*  --  29*  --  18  --  16 18  CREATININE 1.36*   < > 0.96  --  0.84  --  0.84  --  0.88 0.77    CALCIUM 10.2   < > 10.1  --  9.9  --  9.8  --  9.8 9.4  MG 2.4   < > 2.1  --  1.8  --  2.1  --  1.7 1.9  PHOS 3.3  --  1.8*  --  1.9*  --  2.0*  --  2.5  --    < > = values in this interval not displayed.   Liver Function Tests: Recent Labs  Lab 07/25/20 0656 07/26/20 0229 07/27/20 0548 07/28/20 0501 07/29/20 0620  AST 48* 35 29 27 34  ALT 45* 39 35 31 27  ALKPHOS 63 60 62 59 73  BILITOT 0.7 0.6 0.5 0.4 0.8  PROT 7.0 6.7 7.0 6.9 7.0  ALBUMIN 2.3* 2.4* 2.5* 2.5* 2.6*   No results for input(s): LIPASE, AMYLASE in the last 168 hours. No results for input(s): AMMONIA in the last 168 hours. CBC: Recent Labs  Lab 07/25/20 0656 07/25/20 0656 07/26/20 0229 07/27/20 0548 07/28/20 0501 07/29/20 0620 07/30/20 0449  WBC 12.7*   < > 12.7* 12.8* 12.4* 20.7* 19.0*  NEUTROABS 11.2*  --  10.9* 10.9* 10.3* 18.4*  --   HGB 15.4*   < > 14.9 14.8 14.9 15.4* 15.4*  HCT 45.3   < > 43.3 44.5 44.3 46.6* 45.4  MCV 81.8   < > 81.5 84.9 85.0 85.2 83.6  PLT 405*   < > 418* 460* 436* 429* 387   < > = values in this interval not displayed.   Cardiac Enzymes: No results for input(s): CKTOTAL, CKMB, CKMBINDEX, TROPONINI in the last 168 hours. BNP: Invalid input(s): POCBNP CBG: Recent Labs  Lab 07/30/20 1020 07/30/20 1946  GLUCAP 163* 128*       IMAGING RESULTS:  Imaging: DG Abd 1 View  Result Date: 07/30/2020 CLINICAL DATA:  Evaluate enteric tube. EXAM: ABDOMEN - 1 VIEW COMPARISON:  Earlier same day FINDINGS: Enteric tube tip and side port projects over the central upper abdomen. Nonobstructed bowel gas pattern. Osseous structures unremarkable. IMPRESSION: Interval advancement of the enteric tube with the tip and side port projecting over the central upper abdomen. Recommend advancing the tube an additional 5 cm. Electronically Signed   By: Lovey Newcomer M.D.   On: 07/30/2020 16:49   DG Abd 1 View  Result Date: 07/30/2020 CLINICAL DATA:  COVID positive patient.  OG tube evaluation  EXAM: ABDOMEN - 1 VIEW COMPARISON:  None. FINDINGS: Enteric tube tip and side-port project within the distal esophagus, recommend advancement. Patchy airspace opacities lower lungs bilaterally. IMPRESSION: Enteric tube tip and side-port project within the distal esophagus, recommend advancement. These results will be called to the ordering clinician or representative by the Radiologist Assistant, and communication documented in the PACS or [employer]. Electronically Signed   By: Lovey Newcomer M.D.   On: 07/30/2020 13:11   DG Chest Port 1 View  Result Date: 07/30/2020 CLINICAL DATA:  Worsening COVID pneumonia.  Intubation. EXAM: PORTABLE CHEST 1 VIEW COMPARISON:  07/29/2020 FINDINGS: The endotracheal tube is 4.3 cm above the carina. The left IJ catheter tip is in the distal SVC near the cavoatrial junction. The NG tube is coursing down the esophagus and into the stomach. The tip is in the fundal region and the proximal port is near the GE junction. This could be advanced several cm. Persistent diffuse interstitial and airspace process in the lungs but slight improved aeration after intubation. No pneumothorax. IMPRESSION: 1. Persistent infiltrates but slight improved aeration after intubation. 2. ET tube and left IJ catheter in good position. 3. NG tube tip in the fundal region and proximal port is near the GE junction. This could be advanced several cm. Electronically Signed   By: Marijo Sanes M.D.   On: 07/30/2020 13:11   _0 @ DG Abd 1 View  Result Date: 07/30/2020 CLINICAL DATA:  Evaluate enteric tube. EXAM: ABDOMEN - 1 VIEW COMPARISON:  Earlier same day FINDINGS: Enteric tube tip and side port projects over the central upper abdomen. Nonobstructed bowel gas pattern. Osseous structures unremarkable. IMPRESSION: Interval advancement of the enteric tube with the tip and side port projecting over the central upper abdomen. Recommend advancing the tube an additional 5 cm. Electronically Signed    By: Lovey Newcomer M.D.   On: 07/30/2020 16:49   DG Abd 1 View  Result Date: 07/30/2020 CLINICAL DATA:  COVID positive patient.  OG tube evaluation EXAM: ABDOMEN - 1 VIEW COMPARISON:  None. FINDINGS: Enteric tube tip and side-port project within the distal esophagus, recommend advancement. Patchy airspace opacities lower lungs bilaterally. IMPRESSION: Enteric tube tip and side-port project within the distal esophagus, recommend advancement. These results will be called to the ordering clinician or representative by the Radiologist Assistant, and communication documented in the PACS or [employer]. Electronically Signed   By: Lovey Newcomer M.D.   On: 07/30/2020 13:11   DG Chest Port 1 View  Result Date: 07/30/2020 CLINICAL DATA:  Worsening COVID pneumonia.  Intubation. EXAM: PORTABLE CHEST 1 VIEW COMPARISON:  07/29/2020 FINDINGS: The endotracheal tube is 4.3 cm above the carina. The left IJ catheter tip is in the distal SVC near the cavoatrial junction. The NG tube is coursing down the esophagus and into the stomach. The tip is in the fundal region and the proximal port is near the GE junction. This could be advanced several cm. Persistent diffuse interstitial and airspace process in the lungs but slight improved aeration after intubation. No pneumothorax. IMPRESSION: 1. Persistent infiltrates but slight improved aeration after intubation. 2. ET tube and left IJ catheter in good position. 3. NG tube tip in the fundal region and proximal port is near the GE junction. This could be advanced several cm. Electronically Signed   By: Marijo Sanes M.D.   On: 07/30/2020 13:11     ASSESSMENT AND PLAN    -Multidisciplinary rounds held today Acute respiratory failure with hypoxia due to COVID-19 ARDS COVID-19 pneumonia Proceed with intubation and mechanical ventilation ARDS protocol, 6 cc/kg of PBW Recruitment maneuvers as necessary Prone positioning if no improvement Completed remdesivir Continue  Rocephin and azithromycin for now  Atrial fibrillation with RVR Due to physiologic stress/hypoxia Hx: Hyperthyroidism treated with RAI Continue amiodarone and calcium channel blocker Cardiology following Continue cardiac monitoring Correct electrolytes as needed Heparin infusion  Severe intertrigo Multiple skin breakdown areas Wound care following  ID -continue IV abx as prescibed -follow up cultures  GI/Nutrition GI PROPHYLAXIS as  indicated DIET-->TF's as tolerated Constipation protocol as indicated  ENDO - ICU hypoglycemic\Hyperglycemia protocol -check FSBS per protocol   ELECTROLYTES -follow labs as needed -replace as needed -pharmacy consultation   DVT/GI PRX ordered -SCDs  TRANSFUSIONS AS NEEDED MONITOR FSBS ASSESS the need for LABS as needed   Critical care provider statement:    Critical care time (minutes):  109   Critical care time was exclusive of:  Separately billable procedures and treating other patients   Critical care was necessary to treat or prevent imminent or life-threatening deterioration of the following conditions:  Acute hypoxemic respiratory failure, multiple comoribid conditions.    Critical care was time spent personally by me on the following activities:  Development of treatment plan with patient or surrogate, discussions with consultants, evaluation of patient's response to treatment, examination of patient, obtaining history from patient or surrogate, ordering and performing treatments and interventions, ordering and review of laboratory studies and re-evaluation of patient's condition.  I assumed direction of critical care for this patient from another provider in my specialty: no    This document was prepared using Dragon voice recognition software and may include unintentional dictation errors.    Ottie Glazier, M.D.  Division of [place]

## 2020-07-31 NOTE — Progress Notes (Signed)
Per Dr A. Discontinue heparin drip, Cardizem drip, amiodarone drip and hold metoprolol po. Orders to start precedex and titrate propofol. Dr. Arville Go aware of patient urine output last night of 150 ml.

## 2020-07-31 NOTE — Telephone Encounter (Signed)
Routing to admin

## 2020-07-31 NOTE — Progress Notes (Signed)
SUBJECTIVE: Patient is intubated   Vitals:   07/31/20 0730 07/31/20 0800 07/31/20 0900 07/31/20 1000  BP:  98/65 (!) 85/57 98/83  Pulse: 85 91 60 78  Resp: 18 16 (!) 30 (!) 30  Temp: 98.2 F (36.8 C) 98.2 F (36.8 C) 98.1 F (36.7 C) 98.4 F (36.9 C)  TempSrc:      SpO2: 95% 96% 95% 95%  Weight:      Height:        Intake/Output Summary (Last 24 hours) at 07/31/2020 1048 Last data filed at 07/31/2020 0700 Gross per 24 hour  Intake 3885.81 ml  Output 150 ml  Net 3735.81 ml    LABS: Basic Metabolic Panel: Recent Labs    07/29/20 0620 07/30/20 0448  NA 140 139  K 3.7 3.8  CL 104 101  CO2 26 27  GLUCOSE 132* 143*  BUN 16 18  CREATININE 0.88 0.77  CALCIUM 9.8 9.4  MG 1.7 1.9  PHOS 2.5  --    Liver Function Tests: Recent Labs    07/29/20 0620  AST 34  ALT 27  ALKPHOS 73  BILITOT 0.8  PROT 7.0  ALBUMIN 2.6*   No results for input(s): LIPASE, AMYLASE in the last 72 hours. CBC: Recent Labs    07/29/20 0620 07/30/20 0449  WBC 20.7* 19.0*  NEUTROABS 18.4*  --   HGB 15.4* 15.4*  HCT 46.6* 45.4  MCV 85.2 83.6  PLT 429* 387   Cardiac Enzymes: No results for input(s): CKTOTAL, CKMB, CKMBINDEX, TROPONINI in the last 72 hours. BNP: Invalid input(s): POCBNP D-Dimer: No results for input(s): DDIMER in the last 72 hours. Hemoglobin A1C: No results for input(s): HGBA1C in the last 72 hours. Fasting Lipid Panel: Recent Labs    07/30/20 1250  TRIG 93   Thyroid Function Tests: No results for input(s): TSH, T4TOTAL, T3FREE, THYROIDAB in the last 72 hours.  Invalid input(s): FREET3 Anemia Panel: Recent Labs    07/29/20 0620  FERRITIN 566*     PHYSICAL EXAM General: Well developed, well nourished, in no acute distress HEENT:  Normocephalic and atramatic Neck:  No JVD.  Lungs: Clear bilaterally to auscultation and percussion. Heart: HRRR . Normal S1 and S2 without gallops or murmurs.  Abdomen: Bowel sounds are positive, abdomen soft and non-tender   Msk:  Back normal, normal gait. Normal strength and tone for age. Extremities: No clubbing, cyanosis or edema.   Neuro: Alert and oriented X 3. Psych:  Good affect, responds appropriately  TELEMETRY: A. fib with ventricular rate 123  ASSESSMENT AND PLAN: Covid pneumonia with respiratory failure intubated and hypotensive.  Remains in A. fib with ventricular rate 123.  Principal Problem:   Sepsis ([place]) Active Problems:   Hypertension   Morbid obesity ([place])   Hypercalcemia   Hyperthyroidism   AKI (acute kidney injury) ([place])   COVID-19 virus infection   Atrial fibrillation with RVR ([place])   Hypokalemia    Candas Deemer A, MD, [facility] 07/31/2020 10:48 AM

## 2020-07-31 NOTE — Progress Notes (Signed)
[facility], [place] 07/31/20  Subjective:   Hospital day # 7  Neuro: sedated with prop,   cvs: nor epi, vasopressin pulm: Ventilator assisted, FiO2 80% Renal: 09/26 0701 - 09/27 0700 In: 3885.8 [I.V.:3885.8] Out: 150 [Urine:150] Lab Results  Component Value Date   CREATININE 2.06 (H) 07/31/2020   CREATININE 0.77 07/30/2020   CREATININE 0.88 07/29/2020     Objective:  Vital signs in last 24 hours:  Temp:  [96.4 F (35.8 C)-99.1 F (37.3 C)] 99.1 F (37.3 C) (09/27 1415) Pulse Rate:  [25-107] 89 (09/27 1415) Resp:  [0-31] 30 (09/27 1415) BP: (55-149)/(36-90) 140/79 (09/27 1415) SpO2:  [69 %-100 %] 93 % (09/27 1415) FiO2 (%):  [80 %-100 %] 80 % (09/27 1250) Weight:  [108.9 kg] 108.9 kg (09/27 0215)  Weight change: 29.7 kg Filed Weights   07/24/20 0900 07/30/20 0500 07/31/20 0215  Weight: 79.4 kg 79.2 kg 108.9 kg    Intake/Output:    Intake/Output Summary (Last 24 hours) at 07/31/2020 1446 Last data filed at 07/31/2020 0700 Gross per 24 hour  Intake 3885.81 ml  Output 150 ml  Net 3735.81 ml     Physical Exam: General:  Critically ill-appearing, laying in the bed  HEENT  ET tube in place  Pulm/lungs  coarse breath sounds  CVS/Heart  tachycardic, irregular  Abdomen:   Soft, nondistended  Extremities:  Dependent edema present  Neurologic:  Sedated  Skin:  No acute rashes          Basic Metabolic Panel:  Recent Labs  Lab 07/25/20 0656 07/25/20 0656 07/26/20 0229 07/26/20 0229 07/27/20 0548 07/27/20 0548 07/28/20 0501 07/28/20 0501 07/29/20 0620 07/30/20 0448 07/31/20 0653  NA 140   < > 141   < > 138  --  138  --  140 139 138  K 3.5   < > 3.0*   < > 3.1*  --  3.7  --  3.7 3.8 4.7  CL 108   < > 109   < > 105  --  104  --  104 101 99  CO2 23   < > 24   < > 22  --  23  --  '26 27 28  ' GLUCOSE 141*   < > 163*   < > 180*  --  183*  --  132* 143* 145*  BUN 98*   < > 59*   < > 29*  --  18  --  16 18 32*  CREATININE 1.36*    < > 0.96   < > 0.84  --  0.84  --  0.88 0.77 2.06*  CALCIUM 10.2   < > 10.1   < > 9.9   < > 9.8   < > 9.8 9.4 10.1  MG 2.4   < > 2.1   < > 1.8  --  2.1  --  1.7 1.9 2.4  PHOS 3.3  --  1.8*  --  1.9*  --  2.0*  --  2.5  --   --    < > = values in this interval not displayed.     CBC: Recent Labs  Lab 07/25/20 0656 07/25/20 0656 07/26/20 0229 07/27/20 0548 07/28/20 0501 07/29/20 0620 07/30/20 0449  WBC 12.7*   < > 12.7* 12.8* 12.4* 20.7* 19.0*  NEUTROABS 11.2*  --  10.9* 10.9* 10.3* 18.4*  --   HGB 15.4*   < > 14.9 14.8 14.9 15.4* 15.4*  HCT 45.3   < >  43.3 44.5 44.3 46.6* 45.4  MCV 81.8   < > 81.5 84.9 85.0 85.2 83.6  PLT 405*   < > 418* 460* 436* 429* 387   < > = values in this interval not displayed.     No results found for: HEPBSAG, HEPBSAB, HEPBIGM    Microbiology:  Recent Results (from the past 240 hour(s))  Urine culture     Status: Abnormal   Collection Time: 07/24/20  9:12 AM   Specimen: Urine, Random  Result Value Ref Range Status   Specimen Description   Final    URINE, RANDOM Performed at [facility], [address], [place], [place] [address]    Special Requests   Final    NONE Performed at [facility], [address], [place], [place] [address]    Culture (A)  Final    <10,000 COLONIES/mL >=100,000 COLONIES/mL Performed at [facility], [place] [address], [place], [place] [address]    Report Status 07/25/2020 FINAL  Final  Blood culture (routine single)     Status: None   Collection Time: 07/24/20  9:14 AM   Specimen: BLOOD LEFT ARM  Result Value Ref Range Status   Specimen Description BLOOD LEFT ARM  Final   Special Requests   Final    BOTTLES DRAWN AEROBIC AND ANAEROBIC Blood Culture adequate volume   Culture   Final    NO GROWTH 5 DAYS Performed at [facility], [address] [place], [place] [address]    Report Status 07/29/2020 FINAL  Final  SARS Coronavirus 2 by RT PCR (hospital order, performed in  [place] hospital lab) Nasopharyngeal Nasopharyngeal Swab     Status: Abnormal   Collection Time: 07/24/20  9:16 AM   Specimen: Nasopharyngeal Swab  Result Value Ref Range Status   SARS Coronavirus 2 POSITIVE (A) NEGATIVE Final    Comment: RESULT CALLED TO, READ BACK BY AND VERIFIED WITH:  SAMANTHA HAMILTON AT 9323 07/24/20 SDR (NOTE) SARS-CoV-2 target nucleic acids are DETECTED  SARS-CoV-2 RNA is generally detectable in upper respiratory specimens  during the acute phase of infection.  Positive results are indicative  of the presence of the identified virus, but do not rule out bacterial infection or co-infection with other pathogens not detected by the test.  Clinical correlation with patient history and  other diagnostic information is necessary to determine patient infection status.  The expected result is negative.  Fact Sheet for Patients:   StrictlyIdeas.no   Fact Sheet for Healthcare Providers:   BankingDealers.co.za    This test is not yet approved or cleared by the [place] FDA and  has been authorized for detection and/or diagnosis of SARS-CoV-2 by FDA under an Emergency Use Authorization (EUA).  This EUA will remain in effect (meaning thi s test can be used) for the duration of  the COVID-19 declaration under Section 564(b)(1) of the Act, 21 U.S.C. section 360-bbb-3(b)(1), unless the authorization is terminated or revoked sooner.  Performed at [facility], [address] [place], [place] [address]   MRSA PCR Screening     Status: None   Collection Time: 07/30/20 11:08 AM   Specimen: Nasopharyngeal  Result Value Ref Range Status   MRSA by PCR NEGATIVE NEGATIVE Final    Comment:        The GeneXpert MRSA Assay (FDA approved for NASAL specimens only), is one component of a comprehensive MRSA colonization surveillance program. It is not intended to diagnose MRSA infection  nor to guide or monitor  treatment for MRSA infections. Performed at [facility], [address] [place], [place] [address]     Coagulation Studies: No results for input(s): LABPROT, INR in the last 72 hours.  Urinalysis: No results for input(s): COLORURINE, LABSPEC, PHURINE, GLUCOSEU, HGBUR, BILIRUBINUR, KETONESUR, PROTEINUR, UROBILINOGEN, NITRITE, LEUKOCYTESUR in the last 72 hours.  Invalid input(s): APPERANCEUR    Imaging: DG Abd 1 View  Result Date: 07/31/2020 CLINICAL DATA:  Orogastric tube placement EXAM: ABDOMEN - 1 VIEW COMPARISON:  July 30, 2020 FINDINGS: Orogastric tube tip is at the gastroesophageal junction. No bowel dilatation or air-fluid level to suggest bowel obstruction. No free air. IMPRESSION: Orogastric tube tip at gastroesophageal junction. Advise advancing orogastric tube 8-10 cm. No bowel obstruction or free air evident. Electronically Signed   By: Lowella Grip III M.D.   On: 07/31/2020 12:15   DG Abd 1 View  Result Date: 07/30/2020 CLINICAL DATA:  Evaluate enteric tube. EXAM: ABDOMEN - 1 VIEW COMPARISON:  Earlier same day FINDINGS: Enteric tube tip and side port projects over the central upper abdomen. Nonobstructed bowel gas pattern. Osseous structures unremarkable. IMPRESSION: Interval advancement of the enteric tube with the tip and side port projecting over the central upper abdomen. Recommend advancing the tube an additional 5 cm. Electronically Signed   By: Lovey Newcomer M.D.   On: 07/30/2020 16:49   DG Abd 1 View  Result Date: 07/30/2020 CLINICAL DATA:  COVID positive patient.  OG tube evaluation EXAM: ABDOMEN - 1 VIEW COMPARISON:  None. FINDINGS: Enteric tube tip and side-port project within the distal esophagus, recommend advancement. Patchy airspace opacities lower lungs bilaterally. IMPRESSION: Enteric tube tip and side-port project within the distal esophagus, recommend advancement. These results will be called to the ordering clinician or representative by  the Radiologist Assistant, and communication documented in the PACS or [employer]. Electronically Signed   By: Lovey Newcomer M.D.   On: 07/30/2020 13:11   DG Chest Port 1 View  Result Date: 07/31/2020 CLINICAL DATA:  Orogastric tube placement EXAM: PORTABLE CHEST 1 VIEW COMPARISON:  July 30, 2020. FINDINGS: Endotracheal tube tip is 6.0 cm above carina. Orogastric tube tip is at the gastroesophageal junction. Left jugular catheter tip is in the superior vena cava. No pneumothorax. Patchy airspace opacity is present bilaterally, primarily in the periphery of the upper and lower lung regions, essentially stable. Heart is upper normal in size with pulmonary vascularity normal. No adenopathy. No bone lesions. IMPRESSION: Tube and catheter positions as described without pneumothorax. Note that the orogastric tube tip is at the gastroesophageal junction. Advise advancing orogastric tube 8-10 cm. Patchy airspace opacity bilaterally, stable. Stable cardiac silhouette. Electronically Signed   By: Lowella Grip III M.D.   On: 07/31/2020 12:14   DG Chest Port 1 View  Result Date: 07/30/2020 CLINICAL DATA:  Worsening COVID pneumonia.  Intubation. EXAM: PORTABLE CHEST 1 VIEW COMPARISON:  07/29/2020 FINDINGS: The endotracheal tube is 4.3 cm above the carina. The left IJ catheter tip is in the distal SVC near the cavoatrial junction. The NG tube is coursing down the esophagus and into the stomach. The tip is in the fundal region and the proximal port is near the GE junction. This could be advanced several cm. Persistent diffuse interstitial and airspace process in the lungs but slight improved aeration after intubation. No pneumothorax. IMPRESSION: 1. Persistent infiltrates but slight improved aeration after intubation. 2. ET tube and left IJ catheter in good position. 3. NG tube  tip in the fundal region and proximal port is near the GE junction. This could be advanced several cm. Electronically Signed   By: Marijo Sanes M.D.   On: 07/30/2020 13:11     Medications:   . dexmedetomidine (PRECEDEX) IV infusion 0.8 mcg/kg/hr (07/31/20 1420)  . fentaNYL infusion INTRAVENOUS Stopped (07/31/20 0551)  . midazolam    . norepinephrine (LEVOPHED) Adult infusion 20 mcg/min (07/31/20 1126)  . propofol (DIPRIVAN) infusion 15 mcg/kg/min (07/31/20 1000)  . vasopressin 0.03 Units/min (07/31/20 1237)   . amiodarone  200 mg Per Tube BID  . apixaban  5 mg Per Tube BID  . vitamin C  500 mg Oral Daily  . aspirin EC  81 mg Oral Daily  . baricitinib  4 mg Oral Daily  . chlorhexidine gluconate (MEDLINE KIT)  15 mL Mouth Rinse BID  . Chlorhexidine Gluconate Cloth  6 each Topical Daily  . cholecalciferol  2,000 Units Oral Daily  . digoxin  0.25 mg Intravenous Daily  . Gerhardt's butt cream   Topical TID  . mouth rinse  15 mL Mouth Rinse 10 times per day  . multivitamin with minerals  1 tablet Oral Daily  . nystatin   Topical TID  . pneumococcal 23 valent vaccine  0.5 mL Intramuscular Tomorrow-1000  . predniSONE  50 mg Oral Daily  . zinc sulfate  220 mg Oral Daily   acetaminophen **OR** acetaminophen, guaiFENesin-dextromethorphan, haloperidol lactate, HYDROcodone-acetaminophen, metoprolol tartrate, ondansetron **OR** ondansetron (ZOFRAN) IV  Assessment/ Plan:  64 y.o. female with hypertension, hyperlipidemia    admitted on 07/24/2020 for Atrial fibrillation with rapid ventricular response ([facility]) [I48.91] Sepsis ([place]) [A41.9] Acute renal failure, unspecified acute renal failure type ([place]) [N17.9] Skin ulcer, unspecified ulcer stage ([place]) [L98.499] Acute hypoxemic respiratory failure due to COVID-19 ([place]) [U07.1, J96.01] COVID-19 [U07.1]   #Acute renal failure Baseline creatinine of 0.77, 9 GFR greater than 60 on July 30, 2020 AKI most likely secondary to hypotension leading to ATN.  IV contrast exposure on 07/28/2020. Patient is oliguric with low urine output Blood pressure is now stabilized with  pressors Continue to monitor closely No acute indication for dialysis at present  #Acute respiratory failure Secondary to COVID-19 pneumonia Ventilator assisted Management as per ICU team    LOS: [place] 9/27/20212:46 PM  [place], [place]  Note: This note was prepared with Dragon dictation. Any transcription errors are unintentional

## 2020-07-31 NOTE — Progress Notes (Signed)
Per Dr. Loni Muse reviewed abdominal xray. Orders to use OG tube.

## 2020-07-31 NOTE — Progress Notes (Signed)
Initial Nutrition Assessment  DOCUMENTATION CODES:   Obesity unspecified  INTERVENTION:  Once plan is for initiation of tube feeds recommend:  -Initiate Vital AF 1.2 Cal at 20 mL/hr and advance by 10 mL/hr every 8 hours to goal rate of 40 mL/hr (960 mL goal daily volume) per tube -PROSource TF 90 mL TID per tube -MVI daily per tube -Goal regimen provides 1392 kcal, 138 grams of protein, 778 mL H2O daily. With current propofol rate provides 1579 kcal daily.  Monitor magnesium, potassium, and phosphorus daily for at least 3 days, MD to replete as needed, as pt is at risk for refeeding syndrome.  NUTRITION DIAGNOSIS:   Inadequate oral intake related to inability to eat as evidenced by NPO status.  GOAL:   Patient will meet greater than or equal to 90% of their needs  MONITOR:   Vent status, Labs, Weight trends, Skin, I & O's  REASON FOR ASSESSMENT:   Ventilator    ASSESSMENT:   64 year old female with PMHx of HTN, HLD who was admitted with COVID-19 PNA, also with A-fib with RVR and severe intertrigo.   9/26 transferred to ICU and intubated  Patient is currently intubated on ventilator support MV: 12 L/min Temp (24hrs), Avg:98.1 F (36.7 C), Min:96.4 F (35.8 C), Max:98.4 F (36.9 C)  Propofol: 7.1 ml/hr (187 kcal daily)  Medications reviewed and include: amiodarone, Eliquis, vitamin C 500 mg daily, vitamin D3 2000 units daily, MVI daily, prednisone 50 mg daily, zinc sulfate 220 mg daily, Precedex gtt, norepinephrine gtt at 20 mcg/min, propofol gtt.  Labs reviewed: CBG 128, BUN 32, Creatinine 2.06.  I/O: 150 mL UOP yesterday  Weight trend: 108.9 kg on 9/27; pt was 109.8 kg on 10/22/2019; suspect wt of 79.4 kg on 9/20 was inaccurate; pt with mild pitting edema per RN documentation  Enteral Access: OGT placed 9/26; tube was already advanced to 75 cm yesterday and after repeat abdominal x-ray on 7/26 recommended an additional advancement of 5cm; discussed on rounds and  plan is for repeat imaging prior to advancing to ensure tube is not coiled somewhere  Per review of chart PO intake was 0-50% from 9/21-9/23 but limited PO documentation available.  Discussed with RN and on rounds. Plan is to hold off on initiation of tube feeds at this time.  NUTRITION - FOCUSED PHYSICAL EXAM:  Deferred.  Diet Order:   Diet Order    None     EDUCATION NEEDS:   No education needs have been identified at this time  Skin:  Skin Assessment: Skin Integrity Issues: Skin Integrity Issues:: Other (Comment) Other: intertriginous dermatitis to B/L breasts, B/L groin, under pannus, umbilicus; non pressure wound to right lower abdomen  Last BM:  07/29/2020 - type 7  Height:   Ht Readings from Last 1 Encounters:  07/24/20 5\' 5"  (1.651 m)   Weight:   Wt Readings from Last 1 Encounters:  07/31/20 108.9 kg   Ideal Body Weight:  56.8 kg  BMI:  Body mass index is 39.95 kg/m.  Estimated Nutritional Needs:   Kcal:  1553  Protein:  130-140 grams  Fluid:  >/= 2 L/day  Jacklynn Barnacle, MS, RD, LDN Pager number available on Amion

## 2020-07-31 NOTE — Progress Notes (Signed)
PHARMACY CONSULT NOTE - FOLLOW UP  Pharmacy Consult for Electrolyte Monitoring and Replacement   Recent Labs: Potassium (mmol/L)  Date Value  07/31/2020 4.7   Magnesium (mg/dL)  Date Value  07/31/2020 2.4   Calcium (mg/dL)  Date Value  07/31/2020 10.1   Calcium, Total (PTH) (mg/dL)  Date Value  07/24/2020 10.1   Albumin (g/dL)  Date Value  07/29/2020 2.6 (L)  10/22/2019 4.2   Phosphorus (mg/dL)  Date Value  07/29/2020 2.5   Sodium (mmol/L)  Date Value  07/31/2020 138  10/22/2019 139   Assessment: 64 yo female with PMH for HTN and hypotension who was admitted with sepsis, acute respiratory failure, and new onset A. Fib. Patient had hypercalcemia consistent with primary hyperparathyroidism with PTH 118>102 on this admission. Patient is on amiodarone drip and daily digoxin. Pharmacy consulted for electrolyte monitoring and replacement.   Goal of Therapy:  Electrolytes WNL K> 4 Mg >2   Plan:  Electrolytes are WNL, no replacement needed at this time Plan to F/U with AM labs and continue to replace as needed.   Sherilyn Banker, PharmD Pharmacy Resident  07/31/2020 12:17 PM

## 2020-07-31 NOTE — Progress Notes (Signed)
Dr. Loni Muse notified of patients blood pressure. He we enter orders for additional drip.

## 2020-08-01 ENCOUNTER — Inpatient Hospital Stay: Payer: Medicare Other

## 2020-08-01 LAB — GLUCOSE, CAPILLARY
Glucose-Capillary: 159 mg/dL — ABNORMAL HIGH (ref 70–99)
Glucose-Capillary: 167 mg/dL — ABNORMAL HIGH (ref 70–99)
Glucose-Capillary: 168 mg/dL — ABNORMAL HIGH (ref 70–99)
Glucose-Capillary: 172 mg/dL — ABNORMAL HIGH (ref 70–99)
Glucose-Capillary: 183 mg/dL — ABNORMAL HIGH (ref 70–99)

## 2020-08-01 LAB — BLOOD GAS, ARTERIAL
Acid-Base Excess: 3.4 mmol/L — ABNORMAL HIGH (ref 0.0–2.0)
Bicarbonate: 29.1 mmol/L — ABNORMAL HIGH (ref 20.0–28.0)
FIO2: 1
MECHVT: 430 mL
Mechanical Rate: 30
O2 Saturation: 95.7 %
PEEP: 15 cmH2O
Patient temperature: 37
RATE: 30 resp/min
pCO2 arterial: 47 mmHg (ref 32.0–48.0)
pH, Arterial: 7.4 (ref 7.350–7.450)
pO2, Arterial: 78 mmHg — ABNORMAL LOW (ref 83.0–108.0)

## 2020-08-01 LAB — LYME DISEASE, WESTERN BLOT
IgG P18 Ab.: ABSENT
IgG P23 Ab.: ABSENT
IgG P28 Ab.: ABSENT
IgG P30 Ab.: ABSENT
IgG P39 Ab.: ABSENT
IgG P45 Ab.: ABSENT
IgG P58 Ab.: ABSENT
IgG P66 Ab.: ABSENT
IgG P93 Ab.: ABSENT
IgM P23 Ab.: ABSENT
IgM P39 Ab.: ABSENT
IgM P41 Ab.: ABSENT
Lyme IgG Wb: NEGATIVE
Lyme IgM Wb: NEGATIVE

## 2020-08-01 LAB — PHOSPHORUS: Phosphorus: 5 mg/dL — ABNORMAL HIGH (ref 2.5–4.6)

## 2020-08-01 LAB — BASIC METABOLIC PANEL
Anion gap: 10 (ref 5–15)
BUN: 43 mg/dL — ABNORMAL HIGH (ref 8–23)
CO2: 25 mmol/L (ref 22–32)
Calcium: 9.4 mg/dL (ref 8.9–10.3)
Chloride: 101 mmol/L (ref 98–111)
Creatinine, Ser: 2.27 mg/dL — ABNORMAL HIGH (ref 0.44–1.00)
GFR calc Af Amer: 25 mL/min — ABNORMAL LOW (ref >60)
GFR calc non Af Amer: 22 mL/min — ABNORMAL LOW (ref >60)
Glucose, Bld: 160 mg/dL — ABNORMAL HIGH (ref 70–99)
Potassium: 5.1 mmol/L (ref 3.5–5.1)
Sodium: 136 mmol/L (ref 135–145)

## 2020-08-01 LAB — MAGNESIUM: Magnesium: 2.1 mg/dL (ref 1.7–2.4)

## 2020-08-01 LAB — DIGOXIN LEVEL: Digoxin Level: 3.2 ng/mL (ref 1.0–2.0)

## 2020-08-01 MED ORDER — VITAL HIGH PROTEIN PO LIQD
1000.0000 mL | ORAL | Status: DC
  Administered 2020-08-01 – 2020-08-07 (×8): 1000 mL

## 2020-08-01 MED ORDER — PROSOURCE TF PO LIQD
45.0000 mL | Freq: Three times a day (TID) | ORAL | Status: DC
  Administered 2020-08-01 – 2020-08-07 (×17): 45 mL
  Filled 2020-08-01: qty 45

## 2020-08-01 MED ORDER — FUROSEMIDE 10 MG/ML IJ SOLN: 40.0000 mg | Freq: Once | INTRAMUSCULAR | Status: DC

## 2020-08-01 MED ORDER — SODIUM CHLORIDE 0.9 % IV SOLN
2.0000 g | Freq: Two times a day (BID) | INTRAVENOUS | Status: DC
  Administered 2020-08-01 – 2020-08-02 (×4): 2 g via INTRAVENOUS
  Filled 2020-08-01 (×7): qty 2

## 2020-08-01 NOTE — Progress Notes (Signed)
Spoke with Dr. Loni Muse in rounds today. Patient has a temp of 101.8. Tylenol given. Per MD will place sputum culture order. No additional orders except to give tylenol.

## 2020-08-01 NOTE — Progress Notes (Signed)
CRITICAL CARE PROGRESS NOTE    Name: Courtney Anderson MRN: [id number] DOB: 1956/03/22     LOS: 8   SUBJECTIVE FINDINGS & SIGNIFICANT EVENTS    Patient description:  Reason for Consult: Severe respiratory distress and patient with COVID-19 pneumonia. Referring Physician: Florina Ou, MD  Courtney Anderson is an 64 y.o. female.  HPI: Patient is a 64 year old female with history noted below, who was admitted to [facility] on 2020-07-24 brought in via EMS as a "code sepsis".  Patient subsequently tested positive for COVID-19.  She presented with oxygen saturations of 69% on room air and had to be placed on nonrebreather mask to achieve oxygen saturations in the 90s.  She was subsequently admitted to the Covid unit for management of COVID-19 pneumonia.  She was noted to have acute kidney injury she was treated with remdesivir, zinc, vitamin C and antitussives.  She was placed empirically on Rocephin and azithromycin.  She continued to have increased FiO2 requirements.  She was refusing to self prone.  She had atrial fibrillation and was seen by cardiology to assist in management.   07/31/20- patient is on 90% FiO2, will modify meds as patient is on metoprolol and levophed as well as cardizem.  Will d/c propofol to improve SBP and remove arythmogenic vasopressor if able.  Plan to have CVP trending and diuresis today.   Goals of care discussion today - patient made DNR per sister and son of patient. I had 2 separate phone meetings with son and separate conversation with sister today to explain patient is worse with shock and renal failure requiring multiple pressors.   08/01/20- patient remains critically ill. FiO2 has been weaned down to 80% today.   Lines/tubes : Airway 7.5 mm (Active)  Secured at (cm) 23 cm 07/31/20 0806  Measured  From Lips 07/31/20 [place] 07/31/20 0806  Secured By [employer] 07/31/20 0806  Tube Holder Repositioned Yes 07/31/20 0806  Cuff Pressure (cm H2O) 26 cm H2O 07/31/20 0806  Site Condition Dry 07/31/20 0806     CVC Triple Lumen 07/30/20 Left Internal jugular (Active)  Indication for Insertion or Continuance of Line Vasoactive infusions 07/30/20 2010  Site Assessment Clean;Dry;Intact 07/30/20 2010  Proximal Lumen Status Infusing 07/30/20 2010  Medial Lumen Status Infusing 07/30/20 2010  Distal Lumen Status Infusing 07/30/20 2010  Dressing Type Transparent;Occlusive 07/30/20 2010  Dressing Status Clean;Dry;Intact 07/30/20 2010  Line Care Connections checked and tightened 07/30/20 2010  Dressing Change Due 08/06/20 07/30/20 2010     NG/OG Tube Orogastric Center mouth Xray Documented cm marking at nare/ corner of mouth 75 cm (Active)  Cm Marking at Nare/Corner of Mouth (if applicable) 75 cm 38/46/65 2010  Site Assessment Clean;Dry;Intact 07/30/20 2010  Ongoing Placement Verification No change in cm markings or external length of tube from initial placement;No acute changes, not attributed to clinical condition;No change in respiratory status;Xray 07/30/20 2010  Status Suction-low intermittent 07/30/20 2010  Amount of suction 80 mmHg 07/30/20 2010  Drainage Appearance [employer];Coffee ground 07/30/20 2010     Urethral Catheter Courtney Anderson Double-lumen;Latex 16 Fr. (Active)  Indication for Insertion or Continuance of Catheter Unstable critically ill patients first 24-48 hours (See Criteria) 07/31/20 0752  Site Assessment Clean;Intact 07/30/20 2000  Catheter Maintenance Bag below level of bladder;Catheter secured;Drainage bag/tubing not touching floor;Insertion date on drainage bag;No dependent loops;Seal intact 07/30/20 2000  Collection Container Standard drainage bag 07/30/20 2000  Securement Method Securing device (Describe) 07/30/20 2000  Urinary  Catheter  Interventions (if applicable) Unclamped 77/93/90 2000  Output (mL) 75 mL 07/31/20 0600    Microbiology/Sepsis markers: Results for orders placed or performed during the hospital encounter of 07/24/20  Urine culture     Status: Abnormal   Collection Time: 07/24/20  9:12 AM   Specimen: Urine, Random  Result Value Ref Range Status   Specimen Description   Final    URINE, RANDOM Performed at [facility], [address], [place], [place] [address]    Special Requests   Final    NONE Performed at [facility], [address], [place], [place] [address]    Culture (A)  Final    <10,000 COLONIES/mL >=100,000 COLONIES/mL Performed at [facility], [place] [address] [place], [place] [address]    Report Status 07/25/2020 FINAL  Final  Blood culture (routine single)     Status: None   Collection Time: 07/24/20  9:14 AM   Specimen: BLOOD LEFT ARM  Result Value Ref Range Status   Specimen Description BLOOD LEFT ARM  Final   Special Requests   Final    BOTTLES DRAWN AEROBIC AND ANAEROBIC Blood Culture adequate volume   Culture   Final    NO GROWTH 5 DAYS Performed at [facility], [address] [place], [place] [address]    Report Status 07/29/2020 FINAL  Final  SARS Coronavirus 2 by RT PCR (hospital order, performed in [place] hospital lab) Nasopharyngeal Nasopharyngeal Swab     Status: Abnormal   Collection Time: 07/24/20  9:16 AM   Specimen: Nasopharyngeal Swab  Result Value Ref Range Status   SARS Coronavirus 2 POSITIVE (A) NEGATIVE Final    Comment: RESULT CALLED TO, READ BACK BY AND VERIFIED WITH:  SAMANTHA HAMILTON AT 6256 07/24/20 SDR (NOTE) SARS-CoV-2 target nucleic acids are DETECTED  SARS-CoV-2 RNA is generally detectable in upper respiratory specimens  during the acute phase of infection.  Positive results are indicative  of the presence of the identified virus, but do not rule out bacterial infection or co-infection with other  pathogens not detected by the test.  Clinical correlation with patient history and  other diagnostic information is necessary to determine patient infection status.  The expected result is negative.  Fact Sheet for Patients:   StrictlyIdeas.no   Fact Sheet for Healthcare Providers:   BankingDealers.co.za    This test is not yet approved or cleared by the [place] FDA and  has been authorized for detection and/or diagnosis of SARS-CoV-2 by FDA under an Emergency Use Authorization (EUA).  This EUA will remain in effect (meaning thi s test can be used) for the duration of  the COVID-19 declaration under Section 564(b)(1) of the Act, 21 U.S.C. section 360-bbb-3(b)(1), unless the authorization is terminated or revoked sooner.  Performed at [facility], [address] [place], [place] [address]   MRSA PCR Screening     Status: None   Collection Time: 07/30/20 11:08 AM   Specimen: Nasopharyngeal  Result Value Ref Range Status   MRSA by PCR NEGATIVE NEGATIVE Final    Comment:        The GeneXpert MRSA Assay (FDA approved for NASAL specimens only), is one component of a comprehensive MRSA colonization surveillance program. It is not intended to diagnose MRSA infection nor to guide or monitor treatment for MRSA infections. Performed at [facility], [address], [place], [place] [address]     Anti-infectives:  Anti-infectives (From admission, onward)  Start     Dose/Rate Route Frequency Ordered Stop   08/01/20 1200  ceFEPIme (MAXIPIME) 2 g in sodium chloride 0.9 % 100 mL IVPB        2 g 200 mL/hr over 30 Minutes Intravenous Every 12 hours 08/01/20 1026     07/25/20 1000  azithromycin (ZITHROMAX) 500 mg in sodium chloride 0.9 % 250 mL IVPB  Status:  Discontinued        500 mg 250 mL/hr over 60 Minutes Intravenous Every 24 hours 07/24/20 1108 07/24/20 1317   07/25/20 1000  remdesivir 100 mg in sodium  chloride 0.9 % 100 mL IVPB       "Followed by" Linked Group Details   100 mg 200 mL/hr over 30 Minutes Intravenous Daily 07/24/20 1112 07/28/20 1544   07/24/20 1300  fluconazole (DIFLUCAN) tablet 100 mg  Status:  Discontinued        100 mg Oral Daily 07/24/20 1251 07/25/20 1522   07/24/20 1200  remdesivir 200 mg in sodium chloride 0.9% 250 mL IVPB       "Followed by" Linked Group Details   200 mg 580 mL/hr over 30 Minutes Intravenous Once 07/24/20 1112 07/24/20 1424   07/24/20 1115  cefTRIAXone (ROCEPHIN) 2 g in sodium chloride 0.9 % 100 mL IVPB  Status:  Discontinued        2 g 200 mL/hr over 30 Minutes Intravenous Every 24 hours 07/24/20 1108 07/24/20 1113   07/24/20 1000  cefTRIAXone (ROCEPHIN) 2 g in sodium chloride 0.9 % 100 mL IVPB  Status:  Discontinued        2 g 200 mL/hr over 30 Minutes Intravenous Every 24 hours 07/24/20 0947 07/28/20 1438   07/24/20 1000  azithromycin (ZITHROMAX) 500 mg in sodium chloride 0.9 % 250 mL IVPB  Status:  Discontinued        500 mg 250 mL/hr over 60 Minutes Intravenous Every 24 hours 07/24/20 0947 07/28/20 1438       Consults: Treatment Team:  Yolonda Kida, MD Dionisio David, MD Murlean Iba, MD     PAST MEDICAL HISTORY   Past Medical History:  Diagnosis Date  . Allergy   . Elevated transaminase level   . Hyperlipidemia   . Hypertension   . Obesity      SURGICAL HISTORY   Past Surgical History:  Procedure Laterality Date  . BREAST SURGERY    . ECTOPIC PREGNANCY SURGERY       FAMILY HISTORY   Family History  Problem Relation Age of Onset  . Hypertension Father   . Stroke Father   . Hypertension Mother   . Arthritis Mother   . Aneurysm Sister   . Hypertension Sister   . Hypertension Brother   . Hypertension Sister   . Hypertension Brother      SOCIAL HISTORY   Social History   Tobacco Use  . Smoking status: Former Research scientist (life sciences)  . Smokeless tobacco: Never Used  Substance Use Topics  . Alcohol use: Yes      Alcohol/week: 0.0 standard drinks    Comment: on occasion  . Drug use: No     MEDICATIONS   Current Medication:  Current Facility-Administered Medications:  .  acetaminophen (TYLENOL) tablet 650 mg, 650 mg, Oral, Q6H PRN, 650 mg at 08/01/20 0748 **OR** acetaminophen (TYLENOL) suppository 650 mg, 650 mg, Rectal, Q6H PRN, Agbata, Tochukwu, MD .  amiodarone (PACERONE) tablet 200 mg, 200 mg, Per Tube, BID, Lanney Gins, Azure Budnick, MD, 200 mg at 08/01/20  0745 .  apixaban (ELIQUIS) tablet 5 mg, 5 mg, Per Tube, BID, Lanney Gins, Tammatha Cobb, MD, 5 mg at 08/01/20 0744 .  ascorbic acid (VITAMIN C) tablet 500 mg, 500 mg, Oral, Daily, Agbata, Tochukwu, MD, 500 mg at 08/01/20 0739 .  aspirin EC tablet 81 mg, 81 mg, Oral, Daily, Agbata, Tochukwu, MD, 81 mg at 08/01/20 0739 .  baricitinib (OLUMIANT) tablet 4 mg, 4 mg, Oral, Daily, Benita Gutter, RPH, 4 mg at 08/01/20 0749 .  ceFEPIme (MAXIPIME) 2 g in sodium chloride 0.9 % 100 mL IVPB, 2 g, Intravenous, Q12H, Leta Bucklin, MD, Last Rate: 200 mL/hr at 08/01/20 1150, 2 g at 08/01/20 1150 .  chlorhexidine gluconate (MEDLINE KIT) (PERIDEX) 0.12 % solution 15 mL, 15 mL, Mouth Rinse, BID, Akingbade, Jimi O, MD, 15 mL at 08/01/20 0800 .  Chlorhexidine Gluconate Cloth 2 % PADS 6 each, 6 each, Topical, Daily, Tyler Pita, MD, 6 each at 07/31/20 2026 .  cholecalciferol (VITAMIN D3) tablet 2,000 Units, 2,000 Units, Oral, Daily, Agbata, Tochukwu, MD, 2,000 Units at 08/01/20 0740 .  dexmedetomidine (PRECEDEX) 400 MCG/100ML (4 mcg/mL) infusion, 0.4-1.2 mcg/kg/hr, Intravenous, Titrated, Albert Devaul, MD, Last Rate: 32.7 mL/hr at 08/01/20 1015, 1.2 mcg/kg/hr at 08/01/20 1015 .  fentaNYL 2548mg in NS 2560m(1083mml) infusion-PREMIX, 0-400 mcg/hr, Intravenous, Continuous, Hall, Scott A, RPH, Last Rate: 40 mL/hr at 08/01/20 0704, 400 mcg/hr at 08/01/20 0704 .  Gerhardt's butt cream, , Topical, TID, Agbata, Tochukwu, MD, Given at 08/01/20 0750 .   guaiFENesin-dextromethorphan (ROBITUSSIN DM) 100-10 MG/5ML syrup 10 mL, 10 mL, Oral, Q4H PRN, Agbata, Tochukwu, MD .  haloperidol lactate (HALDOL) injection 5 mg, 5 mg, Intravenous, Q6H PRN, PatPara SkeansD .  HYDROcodone-acetaminophen (NORCO/VICODIN) 5-325 MG per tablet 1 tablet, 1 tablet, Oral, Q6H PRN, OumLang SnowP, 1 tablet at 07/25/20 0143 .  MEDLINE mouth rinse, 15 mL, Mouth Rinse, 10 times per day, Akingbade, JimSamuella CotaD, 15 mL at 08/01/20 1151 .  metoprolol tartrate (LOPRESSOR) injection 5 mg, 5 mg, Intravenous, Q2H PRN, [place]wood, Dwayne D, MD, 5 mg at 07/30/20 0920 .  midazolam (VERSED) 50 mg/50 mL (1 mg/mL) premix infusion, 0.5-10 mg/hr, Intravenous, Continuous, GonTyler PitaD .  multivitamin with minerals tablet 1 tablet, 1 tablet, Oral, Daily, Agbata, Tochukwu, MD, 1 tablet at 08/01/20 0746 .  norepinephrine (LEVOPHED) 16 mg in 250m62memix infusion, 0-40 mcg/min, Intravenous, Titrated, AlesOttie Glazier, Stopped at 07/31/20 1806 .  nystatin (MYCOSTATIN/NYSTOP) topical powder, , Topical, TID, PahwDarliss Cheney, Given at 08/01/20 0801 .  ondansetron (ZOFRAN) tablet 4 mg, 4 mg, Oral, Q6H PRN **OR** ondansetron (ZOFRAN) injection 4 mg, 4 mg, Intravenous, Q6H PRN, Agbata, Tochukwu, MD .  pneumococcal 23 valent vaccine (PNEUMOVAX-23) injection 0.5 mL, 0.5 mL, Intramuscular, Tomorrow-1000, Pahwani, Ravi, MD .  [COMPLETED] methylPREDNISolone sodium succinate (SOLU-MEDROL) 40 mg/mL injection 39.6 mg, 0.5 mg/kg, Intravenous, Q12H, 39.6 mg at 07/27/20 0542 **FOLLOWED BY** predniSONE (DELTASONE) tablet 50 mg, 50 mg, Oral, Daily, Agbata, Tochukwu, MD, 50 mg at 08/01/20 0745 .  propofol (DIPRIVAN) 1000 MG/100ML infusion, 5-80 mcg/kg/min, Intravenous, Titrated, GonzTyler Pita, Last Rate: 19.01 mL/hr at 08/01/20 0804, 40 mcg/kg/min at 08/01/20 0804 .  vasopressin (PITRESSIN) 20 Units in sodium chloride 0.9 % 100 mL infusion-*FOR SHOCK*, 0-0.03 Units/min, Intravenous,  Continuous, Natalea Sutliff, MD, Last Rate: 9 mL/hr at 08/01/20 0654, 0.03 Units/min at 08/01/20 0654 .  vecuronium (NORCURON) injection 10 mg, 10 mg, Intravenous, Q1H PRN, KeenBradly Bienenstock .  zinc sulfate  capsule 220 mg, 220 mg, Oral, Daily, Agbata, Tochukwu, MD, 220 mg at 08/01/20 0739    ALLERGIES   Codeine    REVIEW OF SYSTEMS    Unable to perform due to Sedation on MV  PHYSICAL EXAMINATION   Vital Signs: Temp:  [95 F (35 C)-102 F (38.9 C)] 101.8 F (38.8 C) (09/28 1145) Pulse Rate:  [38-137] 69 (09/28 1130) Resp:  [0-31] 30 (09/28 1145) BP: (112-149)/(63-101) 123/69 (09/28 1130) SpO2:  [74 %-98 %] 92 % (09/28 1130) FiO2 (%):  [80 %] 80 % (09/28 0732)  GENERAL:intubated on MV age appropriate HEAD: Normocephalic, atraumatic.  EYES: Pupils equal, round, reactive to light.  No scleral icterus.  MOUTH: Moist mucosal membrane. NECK: Supple. No thyromegaly. No nodules. No JVD.  PULMONARY: crackles at bases difficult to appreciate due to MV CARDIOVASCULAR: S1 and S2. Regular rate and rhythm. No murmurs, rubs, or gallops.  GASTROINTESTINAL: Soft, nontender, non-distended. No masses. Positive bowel sounds. No hepatosplenomegaly.  MUSCULOSKELETAL: No swelling, clubbing, or edema.  NEUROLOGIC: Mild distress due to acute illness SKIN:intact,warm,dry   PERTINENT DATA     Infusions: . ceFEPime (MAXIPIME) IV 2 g (08/01/20 1150)  . dexmedetomidine (PRECEDEX) IV infusion 1.2 mcg/kg/hr (08/01/20 1015)  . fentaNYL infusion INTRAVENOUS 400 mcg/hr (08/01/20 0704)  . midazolam    . norepinephrine (LEVOPHED) Adult infusion Stopped (07/31/20 1806)  . propofol (DIPRIVAN) infusion 40 mcg/kg/min (08/01/20 0804)  . vasopressin 0.03 Units/min (08/01/20 0654)   Scheduled Medications: . amiodarone  200 mg Per Tube BID  . apixaban  5 mg Per Tube BID  . vitamin C  500 mg Oral Daily  . aspirin EC  81 mg Oral Daily  . baricitinib  4 mg Oral Daily  . chlorhexidine gluconate  (MEDLINE KIT)  15 mL Mouth Rinse BID  . Chlorhexidine Gluconate Cloth  6 each Topical Daily  . cholecalciferol  2,000 Units Oral Daily  . Gerhardt's butt cream   Topical TID  . mouth rinse  15 mL Mouth Rinse 10 times per day  . multivitamin with minerals  1 tablet Oral Daily  . nystatin   Topical TID  . pneumococcal 23 valent vaccine  0.5 mL Intramuscular Tomorrow-1000  . predniSONE  50 mg Oral Daily  . zinc sulfate  220 mg Oral Daily   PRN Medications: acetaminophen **OR** acetaminophen, guaiFENesin-dextromethorphan, haloperidol lactate, HYDROcodone-acetaminophen, metoprolol tartrate, ondansetron **OR** ondansetron (ZOFRAN) IV, vecuronium Hemodynamic parameters:   Intake/Output: 09/27 0701 - 09/28 0700 In: 2042.2 [I.V.:2042.2] Out: 740 [Urine:740]  Ventilator  Settings: Vent Mode: PRVC FiO2 (%):  [80 %] 80 % Set Rate:  [30 bmp] 30 bmp Vt Set:  [430 mL] 430 mL PEEP:  [10 cmH20] 10 cmH20 Plateau Pressure:  [24 cmH20] 24 cmH20     LAB RESULTS:  Basic Metabolic Panel: Recent Labs  Lab 07/26/20 0229 07/26/20 0229 07/27/20 0548 07/27/20 0548 07/28/20 0501 07/28/20 0501 07/29/20 0620 07/29/20 0620 07/30/20 0448 07/30/20 0448 07/31/20 0653 08/01/20 0500  NA 141   < > 138   < > 138  --  140  --  139  --  138 136  K 3.0*   < > 3.1*   < > 3.7   < > 3.7   < > 3.8   < > 4.7 5.1  CL 109   < > 105   < > 104  --  104  --  101  --  99 101  CO2 24   < > 22   < >  23  --  26  --  27  --  28 25  GLUCOSE 163*   < > 180*   < > 183*  --  132*  --  143*  --  145* 160*  BUN 59*   < > 29*   < > 18  --  16  --  18  --  32* 43*  CREATININE 0.96   < > 0.84   < > 0.84  --  0.88  --  0.77  --  2.06* 2.27*  CALCIUM 10.1   < > 9.9   < > 9.8  --  9.8  --  9.4  --  10.1 9.4  MG 2.1   < > 1.8   < > 2.1  --  1.7  --  1.9  --  2.4 2.1  PHOS 1.8*  --  1.9*  --  2.0*  --  2.5  --   --   --   --  5.0*   < > = values in this interval not displayed.   Liver Function Tests: Recent Labs  Lab  07/26/20 0229 07/27/20 0548 07/28/20 0501 07/29/20 0620  AST 35 29 27 34  ALT 39 35 31 27  ALKPHOS 60 62 59 73  BILITOT 0.6 0.5 0.4 0.8  PROT 6.7 7.0 6.9 7.0  ALBUMIN 2.4* 2.5* 2.5* 2.6*   No results for input(s): LIPASE, AMYLASE in the last 168 hours. No results for input(s): AMMONIA in the last 168 hours. CBC: Recent Labs  Lab 07/26/20 0229 07/27/20 0548 07/28/20 0501 07/29/20 0620 07/30/20 0449  WBC 12.7* 12.8* 12.4* 20.7* 19.0*  NEUTROABS 10.9* 10.9* 10.3* 18.4*  --   HGB 14.9 14.8 14.9 15.4* 15.4*  HCT 43.3 44.5 44.3 46.6* 45.4  MCV 81.5 84.9 85.0 85.2 83.6  PLT 418* 460* 436* 429* 387   Cardiac Enzymes: No results for input(s): CKTOTAL, CKMB, CKMBINDEX, TROPONINI in the last 168 hours. BNP: Invalid input(s): POCBNP CBG: Recent Labs  Lab 07/30/20 1020 07/30/20 1946 08/01/20 0750 08/01/20 1127  GLUCAP 163* 128* 183* 159*       IMAGING RESULTS:  Imaging: DG Abd 1 View  Result Date: 07/31/2020 CLINICAL DATA:  Orogastric tube placement EXAM: ABDOMEN - 1 VIEW COMPARISON:  July 30, 2020 FINDINGS: Orogastric tube tip is at the gastroesophageal junction. No bowel dilatation or air-fluid level to suggest bowel obstruction. No free air. IMPRESSION: Orogastric tube tip at gastroesophageal junction. Advise advancing orogastric tube 8-10 cm. No bowel obstruction or free air evident. Electronically Signed   By: Lowella Grip III M.D.   On: 07/31/2020 12:15   DG Abd 1 View  Result Date: 07/30/2020 CLINICAL DATA:  Evaluate enteric tube. EXAM: ABDOMEN - 1 VIEW COMPARISON:  Earlier same day FINDINGS: Enteric tube tip and side port projects over the central upper abdomen. Nonobstructed bowel gas pattern. Osseous structures unremarkable. IMPRESSION: Interval advancement of the enteric tube with the tip and side port projecting over the central upper abdomen. Recommend advancing the tube an additional 5 cm. Electronically Signed   By: Lovey Newcomer M.D.   On: 07/30/2020  16:49   DG Abd 1 View  Result Date: 07/30/2020 CLINICAL DATA:  COVID positive patient.  OG tube evaluation EXAM: ABDOMEN - 1 VIEW COMPARISON:  None. FINDINGS: Enteric tube tip and side-port project within the distal esophagus, recommend advancement. Patchy airspace opacities lower lungs bilaterally. IMPRESSION: Enteric tube tip and side-port project within the distal esophagus, recommend advancement. These results  will be called to the ordering clinician or representative by the Radiologist Assistant, and communication documented in the PACS or [employer]. Electronically Signed   By: Lovey Newcomer M.D.   On: 07/30/2020 13:11   DG Chest Port 1 View  Result Date: 07/31/2020 CLINICAL DATA:  Orogastric tube placement EXAM: PORTABLE CHEST 1 VIEW COMPARISON:  July 30, 2020. FINDINGS: Endotracheal tube tip is 6.0 cm above carina. Orogastric tube tip is at the gastroesophageal junction. Left jugular catheter tip is in the superior vena cava. No pneumothorax. Patchy airspace opacity is present bilaterally, primarily in the periphery of the upper and lower lung regions, essentially stable. Heart is upper normal in size with pulmonary vascularity normal. No adenopathy. No bone lesions. IMPRESSION: Tube and catheter positions as described without pneumothorax. Note that the orogastric tube tip is at the gastroesophageal junction. Advise advancing orogastric tube 8-10 cm. Patchy airspace opacity bilaterally, stable. Stable cardiac silhouette. Electronically Signed   By: Lowella Grip III M.D.   On: 07/31/2020 12:14   DG Chest Port 1 View  Result Date: 07/30/2020 CLINICAL DATA:  Worsening COVID pneumonia.  Intubation. EXAM: PORTABLE CHEST 1 VIEW COMPARISON:  07/29/2020 FINDINGS: The endotracheal tube is 4.3 cm above the carina. The left IJ catheter tip is in the distal SVC near the cavoatrial junction. The NG tube is coursing down the esophagus and into the stomach. The tip is in the fundal region and the  proximal port is near the GE junction. This could be advanced several cm. Persistent diffuse interstitial and airspace process in the lungs but slight improved aeration after intubation. No pneumothorax. IMPRESSION: 1. Persistent infiltrates but slight improved aeration after intubation. 2. ET tube and left IJ catheter in good position. 3. NG tube tip in the fundal region and proximal port is near the GE junction. This could be advanced several cm. Electronically Signed   By: Marijo Sanes M.D.   On: 07/30/2020 13:11   _0 @ No results found.   ASSESSMENT AND PLAN    -Multidisciplinary rounds held today Acute respiratory failure with hypoxia due to COVID-19 ARDS COVID-19 pneumonia Proceed with intubation and mechanical ventilation ARDS protocol, 6 cc/kg of PBW Recruitment maneuvers as necessary Prone positioning if no improvement Completed remdesivir Continue Rocephin and azithromycin for now  Atrial fibrillation with RVR Due to physiologic stress/hypoxia Hx: Hyperthyroidism treated with RAI Continue amiodarone and calcium channel blocker Cardiology following Continue cardiac monitoring Correct electrolytes as needed Heparin infusion  Severe intertrigo Multiple skin breakdown areas Wound care following  ID -continue IV abx as prescibed -follow up cultures  GI/Nutrition GI PROPHYLAXIS as indicated DIET-->TF's as tolerated Constipation protocol as indicated  ENDO - ICU hypoglycemic\Hyperglycemia protocol -check FSBS per protocol   ELECTROLYTES -follow labs as needed -replace as needed -pharmacy consultation   DVT/GI PRX ordered -SCDs  TRANSFUSIONS AS NEEDED MONITOR FSBS ASSESS the need for LABS as needed   Critical care provider statement:    Critical care time (minutes):  34   Critical care time was exclusive of:  Separately billable procedures and treating other patients   Critical care was necessary to treat or prevent imminent or life-threatening  deterioration of the following conditions:  Acute hypoxemic respiratory failure, multiple comoribid conditions.    Critical care was time spent personally by me on the following activities:  Development of treatment plan with patient or surrogate, discussions with consultants, evaluation of patient's response to treatment, examination of patient, obtaining history from patient or surrogate, ordering and performing  treatments and interventions, ordering and review of laboratory studies and re-evaluation of patient's condition.  I assumed direction of critical care for this patient from another provider in my specialty: no    This document was prepared using Dragon voice recognition software and may include unintentional dictation errors.    Ottie Glazier, M.D.  Division of [place]

## 2020-08-01 NOTE — Telephone Encounter (Signed)
Unable to lvm, due to VM not set up, to make this apt.

## 2020-08-01 NOTE — Progress Notes (Signed)
Digoxin level resulted at 9:17 am and pharmacy called with level of 3.2. Per Dr. Loni Muse will discuss in rounds with pharmacy.

## 2020-08-01 NOTE — Progress Notes (Signed)
PHARMACY CONSULT NOTE - FOLLOW UP  Pharmacy Consult for Electrolyte Monitoring and Replacement   Recent Labs: Potassium (mmol/L)  Date Value  08/01/2020 5.1   Magnesium (mg/dL)  Date Value  08/01/2020 2.1   Calcium (mg/dL)  Date Value  08/01/2020 9.4   Calcium, Total (PTH) (mg/dL)  Date Value  07/24/2020 10.1   Albumin (g/dL)  Date Value  07/29/2020 2.6 (L)  10/22/2019 4.2   Phosphorus (mg/dL)  Date Value  08/01/2020 5.0 (H)   Sodium (mmol/L)  Date Value  08/01/2020 136  10/22/2019 139   Assessment: 64 yo female with PMH for HTN and hypotension who was admitted with sepsis, acute respiratory failure, and new onset A. Fib. Patient had hypercalcemia consistent with primary hyperparathyroidism with PTH 118>102 on this admission. Patient is on amiodarone drip and daily digoxin. Pharmacy consulted for electrolyte monitoring and replacement.   9/28 electrolytes WNL. Scr increased from 2.06>>2.27, UOP of 0.3 mL/kg/hr yesterday.  Pt requiring pressure support with vasopressin and not requiring any maintenance fluids at this time. Pt been NPO during entire admission and wasn't eating well prior to admission. Plan to start tube feeds slowly today.  Goal of Therapy:  Electrolytes WNL K> 4 Mg >2   Plan:  -No additional replacement needed at this time -Recheck electrolytes with AM labs and monitor Mag, Phos, and K closely with reintroduction of nutrition.  Jacobo Forest PharmD Candidate 2022 08/01/2020 11:30 AM

## 2020-08-01 NOTE — Progress Notes (Signed)
[facility], [place] 08/01/20  Subjective:   Hospital day # 8  Patient stays critically ill, intubated,FiO2 80%, sedated with Precedex,Fentanyl,and Versed  Renal: 09/27 0701 - 09/28 0700 In: 2042.2 [I.V.:2042.2] Out: 740 [Urine:740] Lab Results  Component Value Date   CREATININE 2.27 (H) 08/01/2020   CREATININE 2.06 (H) 07/31/2020   CREATININE 0.77 07/30/2020     Objective:  Vital signs in last 24 hours:  Temp:  [95 F (35 C)-102 F (38.9 C)] 102 F (38.9 C) (09/28 1430) Pulse Rate:  [38-137] 72 (09/28 1430) Resp:  [10-31] 30 (09/28 1430) BP: (112-144)/(62-94) 127/74 (09/28 1430) SpO2:  [90 %-96 %] 92 % (09/28 1430) FiO2 (%):  [80 %-90 %] 90 % (09/28 1401)  Weight change:  Filed Weights   07/24/20 0900 07/30/20 0500 07/31/20 0215  Weight: 79.4 kg 79.2 kg 108.9 kg    Intake/Output:    Intake/Output Summary (Last 24 hours) at 08/01/2020 1501 Last data filed at 08/01/2020 1400 Gross per 24 hour  Intake 2042.18 ml  Output 1150 ml  Net 892.18 ml     Physical Exam: General:  Critically ill-appearing  HEENT  ETT and OGT in place  Pulm/lungs  Lungs with coarse breath sounds, vent dependent  CVS/Heart  tachycardic, irregular  Abdomen:   Soft, nondistended  Extremities:  Peripheral edema +  Neurologic:  Sedated  Skin:  No acute rashes          Basic Metabolic Panel:  Recent Labs  Lab 07/26/20 0229 07/26/20 0229 07/27/20 0548 07/27/20 0548 07/28/20 0501 07/28/20 0501 07/29/20 0620 07/29/20 0620 07/30/20 0448 07/31/20 0653 08/01/20 0500  NA 141   < > 138   < > 138  --  140  --  139 138 136  K 3.0*   < > 3.1*   < > 3.7  --  3.7  --  3.8 4.7 5.1  CL 109   < > 105   < > 104  --  104  --  101 99 101  CO2 24   < > 22   < > 23  --  26  --  '27 28 25  ' GLUCOSE 163*   < > 180*   < > 183*  --  132*  --  143* 145* 160*  BUN 59*   < > 29*   < > 18  --  16  --  18 32* 43*  CREATININE 0.96   < > 0.84   < > 0.84  --  0.88  --  0.77  2.06* 2.27*  CALCIUM 10.1   < > 9.9   < > 9.8   < > 9.8   < > 9.4 10.1 9.4  MG 2.1   < > 1.8   < > 2.1  --  1.7  --  1.9 2.4 2.1  PHOS 1.8*  --  1.9*  --  2.0*  --  2.5  --   --   --  5.0*   < > = values in this interval not displayed.     CBC: Recent Labs  Lab 07/26/20 0229 07/27/20 0548 07/28/20 0501 07/29/20 0620 07/30/20 0449  WBC 12.7* 12.8* 12.4* 20.7* 19.0*  NEUTROABS 10.9* 10.9* 10.3* 18.4*  --   HGB 14.9 14.8 14.9 15.4* 15.4*  HCT 43.3 44.5 44.3 46.6* 45.4  MCV 81.5 84.9 85.0 85.2 83.6  PLT 418* 460* 436* 429* 387     No results found for: HEPBSAG, HEPBSAB, HEPBIGM  Microbiology:  Recent Results (from the past 240 hour(s))  Urine culture     Status: Abnormal   Collection Time: 07/24/20  9:12 AM   Specimen: Urine, Random  Result Value Ref Range Status   Specimen Description   Final    URINE, RANDOM Performed at [facility], [address], [place], [place] [address]    Special Requests   Final    NONE Performed at [facility], [address], [place], [place] [address]    Culture (A)  Final    <10,000 COLONIES/mL >=100,000 COLONIES/mL Performed at [facility], [place] [address] [place], [place] [address]    Report Status 07/25/2020 FINAL  Final  Blood culture (routine single)     Status: None   Collection Time: 07/24/20  9:14 AM   Specimen: BLOOD LEFT ARM  Result Value Ref Range Status   Specimen Description BLOOD LEFT ARM  Final   Special Requests   Final    BOTTLES DRAWN AEROBIC AND ANAEROBIC Blood Culture adequate volume   Culture   Final    NO GROWTH 5 DAYS Performed at [facility], [address] [place], [place] [address]    Report Status 07/29/2020 FINAL  Final  SARS Coronavirus 2 by RT PCR (hospital order, performed in [place] hospital lab) Nasopharyngeal Nasopharyngeal Swab     Status: Abnormal   Collection Time: 07/24/20  9:16 AM   Specimen: Nasopharyngeal Swab  Result Value Ref Range Status    SARS Coronavirus 2 POSITIVE (A) NEGATIVE Final    Comment: RESULT CALLED TO, READ BACK BY AND VERIFIED WITH:  SAMANTHA HAMILTON AT 2263 07/24/20 SDR (NOTE) SARS-CoV-2 target nucleic acids are DETECTED  SARS-CoV-2 RNA is generally detectable in upper respiratory specimens  during the acute phase of infection.  Positive results are indicative  of the presence of the identified virus, but do not rule out bacterial infection or co-infection with other pathogens not detected by the test.  Clinical correlation with patient history and  other diagnostic information is necessary to determine patient infection status.  The expected result is negative.  Fact Sheet for Patients:   StrictlyIdeas.no   Fact Sheet for Healthcare Providers:   BankingDealers.co.za    This test is not yet approved or cleared by the [place] FDA and  has been authorized for detection and/or diagnosis of SARS-CoV-2 by FDA under an Emergency Use Authorization (EUA).  This EUA will remain in effect (meaning thi s test can be used) for the duration of  the COVID-19 declaration under Section 564(b)(1) of the Act, 21 U.S.C. section 360-bbb-3(b)(1), unless the authorization is terminated or revoked sooner.  Performed at [facility], [address] [place], [place] [address]   MRSA PCR Screening     Status: None   Collection Time: 07/30/20 11:08 AM   Specimen: Nasopharyngeal  Result Value Ref Range Status   MRSA by PCR NEGATIVE NEGATIVE Final    Comment:        The GeneXpert MRSA Assay (FDA approved for NASAL specimens only), is one component of a comprehensive MRSA colonization surveillance program. It is not intended to diagnose MRSA infection nor to guide or monitor treatment for MRSA infections. Performed at [facility], [address] [place],  [address]     Coagulation Studies: No results for input(s): LABPROT, INR in  the last 72 hours.  Urinalysis: No results for input(s): COLORURINE, LABSPEC, [place], [place], [place], [place], [place], [place], [place],  NITRITE, LEUKOCYTESUR in the last 72 hours.  Invalid input(s): APPERANCEUR    Imaging: DG Abd 1 View  Result Date: 07/31/2020 CLINICAL DATA:  Orogastric tube placement EXAM: ABDOMEN - 1 VIEW COMPARISON:  July 30, 2020 FINDINGS: Orogastric tube tip is at the gastroesophageal junction. No bowel dilatation or air-fluid level to suggest bowel obstruction. No free air. IMPRESSION: Orogastric tube tip at gastroesophageal junction. Advise advancing orogastric tube 8-10 cm. No bowel obstruction or free air evident. Electronically Signed   By: Lowella Grip III M.D.   On: 07/31/2020 12:15   DG Abd 1 View  Result Date: 07/30/2020 CLINICAL DATA:  Evaluate enteric tube. EXAM: ABDOMEN - 1 VIEW COMPARISON:  Earlier same day FINDINGS: Enteric tube tip and side port projects over the central upper abdomen. Nonobstructed bowel gas pattern. Osseous structures unremarkable. IMPRESSION: Interval advancement of the enteric tube with the tip and side port projecting over the central upper abdomen. Recommend advancing the tube an additional 5 cm. Electronically Signed   By: Lovey Newcomer M.D.   On: 07/30/2020 16:49   DG Chest Port 1 View  Result Date: 07/31/2020 CLINICAL DATA:  Orogastric tube placement EXAM: PORTABLE CHEST 1 VIEW COMPARISON:  July 30, 2020. FINDINGS: Endotracheal tube tip is 6.0 cm above carina. Orogastric tube tip is at the gastroesophageal junction. Left jugular catheter tip is in the superior vena cava. No pneumothorax. Patchy airspace opacity is present bilaterally, primarily in the periphery of the upper and lower lung regions, essentially stable. Heart is upper normal in size with pulmonary vascularity normal. No adenopathy. No bone lesions. IMPRESSION: Tube and catheter positions as described without pneumothorax. Note that the  orogastric tube tip is at the gastroesophageal junction. Advise advancing orogastric tube 8-10 cm. Patchy airspace opacity bilaterally, stable. Stable cardiac silhouette. Electronically Signed   By: Lowella Grip III M.D.   On: 07/31/2020 12:14     Medications:   . ceFEPime (MAXIPIME) IV 2 g (08/01/20 1150)  . dexmedetomidine (PRECEDEX) IV infusion 1.2 mcg/kg/hr (08/01/20 1315)  . feeding supplement (VITAL HIGH PROTEIN)    . fentaNYL infusion INTRAVENOUS 300 mcg/hr (08/01/20 1315)  . midazolam    . norepinephrine (LEVOPHED) Adult infusion Stopped (07/31/20 1806)  . propofol (DIPRIVAN) infusion 25 mcg/kg/min (08/01/20 1315)  . vasopressin 0.03 Units/min (08/01/20 0654)   . amiodarone  200 mg Per Tube BID  . apixaban  5 mg Per Tube BID  . vitamin C  500 mg Oral Daily  . aspirin EC  81 mg Oral Daily  . baricitinib  4 mg Oral Daily  . chlorhexidine gluconate (MEDLINE KIT)  15 mL Mouth Rinse BID  . Chlorhexidine Gluconate Cloth  6 each Topical Daily  . cholecalciferol  2,000 Units Oral Daily  . feeding supplement (PROSource TF)  45 mL Per Tube TID  . Gerhardt's butt cream   Topical TID  . mouth rinse  15 mL Mouth Rinse 10 times per day  . multivitamin with minerals  1 tablet Oral Daily  . nystatin   Topical TID  . pneumococcal 23 valent vaccine  0.5 mL Intramuscular Tomorrow-1000  . predniSONE  50 mg Oral Daily  . zinc sulfate  220 mg Oral Daily   acetaminophen **OR** acetaminophen, guaiFENesin-dextromethorphan, haloperidol lactate, HYDROcodone-acetaminophen, metoprolol tartrate, ondansetron **OR** ondansetron (ZOFRAN) IV, vecuronium  Assessment/ Plan:  64 y.o. female with hypertension, hyperlipidemia    admitted on 07/24/2020 for Atrial fibrillation with rapid ventricular response (HCC) [I48.91] Sepsis ([place]) [A41.9] Acute renal failure, unspecified acute  renal failure type ([place]) [N17.9] Skin ulcer, unspecified ulcer stage ([place]) [L98.499] Acute hypoxemic respiratory failure due  to COVID-19 ([place]) [U07.1, J96.01] COVID-19 [U07.1]   #Acute renal failure Baseline creatinine of 0.77, 9 GFR greater than 60 on July 30, 2020 AKI most likely secondary to hypotension leading to ATN.  IV contrast exposure on 07/28/2020.  Urine output for the preceding 24 hours is 740 ml,clear yellow, draining via F/C Blood pressure  Stable, on vasopressin,levophed Continue to monitor closely No acute indication for dialysis at present  #Acute respiratory failure Secondary to COVID-19 pneumonia Ventilator assisted Management as per ICU team    LOS: 8 Caldwell Kronenberger 9/28/20213:01 PM  [place], [place]  Note: This note was prepared with Dragon dictation. Any transcription errors are unintentional

## 2020-08-01 NOTE — Progress Notes (Signed)
Nutrition Follow-up  DOCUMENTATION CODES:   Obesity unspecified  INTERVENTION:  Initiate Vital High Protein at 20 mL/hr and advance by 15 mL/hr every 8 hours to goal rate of 50 mL/hr (1200 mL goal daily volume) per tube. Also provide PROsource TF 45 mL TID per tube. Goal regimen provides 1320 kcal, 138 grams of protein, 1008 mL H2O daily. With current propofol rate provides 1634 kcal daily.  Provide MVI daily per tube.  Monitor magnesium, potassium, and phosphorus daily for at least 3 days, MD to replete as needed, as pt is at risk for refeeding syndrome.  NUTRITION DIAGNOSIS:   Inadequate oral intake related to inability to eat as evidenced by NPO status.  Ongoing.  GOAL:   Patient will meet greater than or equal to 90% of their needs  Not met.  MONITOR:   Vent status, Labs, Weight trends, Skin, I & O's  REASON FOR ASSESSMENT:   Ventilator    ASSESSMENT:   64 year old female with PMHx of HTN, HLD who was admitted with COVID-19 PNA, also with A-fib with RVR and severe intertrigo.  9/26 transferred to ICU and intubated  Patient is currently intubated on ventilator support MV: 12.7 L/min Temp (24hrs), Avg:100.6 F (38.1 C), Min:95 F (35 C), Max:102 F (38.9 C)  Propofol: 11.9 ml/hr (314 kcal daily)  Medications reviewed and include: vitamin C 500 mg daily, vitamin D3 2000 units daily, MVI daily, prednisone 50 mg daily, zinc sulfate 220 mg daily, cefepime, Precedex gtt, fentanyl gtt, propofol gtt, vasopressin.  Labs reviewed: CBG 159-183, BUN 43, Creatinine 2.27, Phosphorus 5.  I/O: 740 mL UOP yesterday  Enteral Access: OGT placed 9/26; per abdominal x-ray 9/27 tube tip is at the GE junction and advancement of 8-10 cm is recommended; however per MD plan is to use tube where it is and not advance  Discussed with RN and on rounds. Propofol gtt now higher. Plan is to initiate tube feeds today, even with tube at current location.  Diet Order:   Diet Order     None     EDUCATION NEEDS:   No education needs have been identified at this time  Skin:  Skin Assessment: Skin Integrity Issues: Skin Integrity Issues:: Other (Comment) Other: intertriginous dermatitis to B/L breasts, B/L groin, under pannus, umbilicus; non pressure wound to right lower abdomen  Last BM:  07/29/2020 per chart  Height:   Ht Readings from Last 1 Encounters:  07/24/20 5' 5" (1.651 m)   Weight:   Wt Readings from Last 1 Encounters:  07/31/20 108.9 kg   Ideal Body Weight:  56.8 kg  BMI:  Body mass index is 39.95 kg/m.  Estimated Nutritional Needs:   Kcal:  1553  Protein:  130-140 grams  Fluid:  >/= 2 L/day  Jacklynn Barnacle, MS, RD, LDN Pager number available on Amion

## 2020-08-01 NOTE — Progress Notes (Signed)
Spoke with Dr A regarding patients OG tube. When flushing tube and starting tube feeds patient had brown liquid fluid coming from her mouth and nose. OG tube verified yesterday at 75 cm and is at 75 cm. Per MD advance tube another 8 cm and obtain another abdominal xray.

## 2020-08-02 DIAGNOSIS — Z66 Do not resuscitate: Secondary | ICD-10-CM

## 2020-08-02 DIAGNOSIS — I10 Essential (primary) hypertension: Secondary | ICD-10-CM | POA: Diagnosis not present

## 2020-08-02 DIAGNOSIS — I4891 Unspecified atrial fibrillation: Secondary | ICD-10-CM | POA: Diagnosis not present

## 2020-08-02 DIAGNOSIS — Z7189 Other specified counseling: Secondary | ICD-10-CM

## 2020-08-02 DIAGNOSIS — Z515 Encounter for palliative care: Secondary | ICD-10-CM

## 2020-08-02 DIAGNOSIS — N179 Acute kidney failure, unspecified: Secondary | ICD-10-CM | POA: Diagnosis not present

## 2020-08-02 DIAGNOSIS — J9601 Acute respiratory failure with hypoxia: Secondary | ICD-10-CM

## 2020-08-02 DIAGNOSIS — U071 COVID-19: Secondary | ICD-10-CM | POA: Diagnosis not present

## 2020-08-02 LAB — RENAL FUNCTION PANEL
Albumin: 2.1 g/dL — ABNORMAL LOW (ref 3.5–5.0)
Anion gap: 8 (ref 5–15)
BUN: 50 mg/dL — ABNORMAL HIGH (ref 8–23)
CO2: 26 mmol/L (ref 22–32)
Calcium: 9.5 mg/dL (ref 8.9–10.3)
Chloride: 102 mmol/L (ref 98–111)
Creatinine, Ser: 2.07 mg/dL — ABNORMAL HIGH (ref 0.44–1.00)
GFR calc Af Amer: 28 mL/min — ABNORMAL LOW (ref >60)
GFR calc non Af Amer: 25 mL/min — ABNORMAL LOW (ref >60)
Glucose, Bld: 165 mg/dL — ABNORMAL HIGH (ref 70–99)
Phosphorus: 4.4 mg/dL (ref 2.5–4.6)
Potassium: 5.3 mmol/L — ABNORMAL HIGH (ref 3.5–5.1)
Sodium: 136 mmol/L (ref 135–145)

## 2020-08-02 LAB — CBC WITH DIFFERENTIAL/PLATELET
Abs Immature Granulocytes: 0.09 10*3/uL — ABNORMAL HIGH (ref 0.00–0.07)
Basophils Absolute: 0 10*3/uL (ref 0.0–0.1)
Basophils Relative: 0 %
Eosinophils Absolute: 0 10*3/uL (ref 0.0–0.5)
Eosinophils Relative: 0 %
HCT: 37.9 % (ref 36.0–46.0)
Hemoglobin: 12.2 g/dL (ref 12.0–15.0)
Immature Granulocytes: 1 %
Lymphocytes Relative: 3 %
Lymphs Abs: 0.5 10*3/uL — ABNORMAL LOW (ref 0.7–4.0)
MCH: 27.8 pg (ref 26.0–34.0)
MCHC: 32.2 g/dL (ref 30.0–36.0)
MCV: 86.3 fL (ref 80.0–100.0)
Monocytes Absolute: 0.6 10*3/uL (ref 0.1–1.0)
Monocytes Relative: 5 %
Neutro Abs: 12.7 10*3/uL — ABNORMAL HIGH (ref 1.7–7.7)
Neutrophils Relative %: 91 %
Platelets: 107 10*3/uL — ABNORMAL LOW (ref 150–400)
RBC: 4.39 MIL/uL (ref 3.87–5.11)
RDW: 14.6 % (ref 11.5–15.5)
WBC: 14 10*3/uL — ABNORMAL HIGH (ref 4.0–10.5)
nRBC: 0 % (ref 0.0–0.2)

## 2020-08-02 LAB — GLUCOSE, CAPILLARY
Glucose-Capillary: 143 mg/dL — ABNORMAL HIGH (ref 70–99)
Glucose-Capillary: 153 mg/dL — ABNORMAL HIGH (ref 70–99)
Glucose-Capillary: 176 mg/dL — ABNORMAL HIGH (ref 70–99)
Glucose-Capillary: 180 mg/dL — ABNORMAL HIGH (ref 70–99)
Glucose-Capillary: 205 mg/dL — ABNORMAL HIGH (ref 70–99)
Glucose-Capillary: 209 mg/dL — ABNORMAL HIGH (ref 70–99)

## 2020-08-02 LAB — TRIGLYCERIDES: Triglycerides: 110 mg/dL (ref <150)

## 2020-08-02 LAB — MAGNESIUM: Magnesium: 2.1 mg/dL (ref 1.7–2.4)

## 2020-08-02 NOTE — Progress Notes (Signed)
PHARMACY CONSULT NOTE - FOLLOW UP  Pharmacy Consult for Electrolyte Monitoring and Replacement   Recent Labs: Potassium (mmol/L)  Date Value  08/02/2020 5.3 (H)   Magnesium (mg/dL)  Date Value  08/02/2020 2.1   Calcium (mg/dL)  Date Value  08/02/2020 9.5   Calcium, Total (PTH) (mg/dL)  Date Value  07/24/2020 10.1   Albumin (g/dL)  Date Value  08/02/2020 2.1 (L)  10/22/2019 4.2   Phosphorus (mg/dL)  Date Value  08/02/2020 4.4   Sodium (mmol/L)  Date Value  08/02/2020 136  10/22/2019 139   Assessment: 64 yo female with PMH for HTN and hypotension who was admitted with sepsis, acute respiratory failure, and new onset A. Fib. Patient had hypercalcemia consistent with primary hyperparathyroidism with PTH 118>102 on this admission. Pharmacy consulted for electrolyte monitoring and replacement.   9/28 electrolytes WNL. Scr increased from 2.06>>2.27. Digoxin discontinued s/t elevated level in setting or worsening renal function. Pt requiring pressure support with vasopressin and not requiring any maintenance fluids at this time. Tube feeds started.  Goal of Therapy:  Electrolytes WNL K> 4 Mg >2   Plan:  Potassium slightly elevated, phos and mag WNL. No replacement indicated at this time. Continue to follow along.  Tawnya Crook, PharmD Clinical Pharmacist 08/02/2020 11:41 AM

## 2020-08-02 NOTE — Telephone Encounter (Signed)
Received message form Jimmy Footman NP Pt is in ICU on Life support.

## 2020-08-02 NOTE — Progress Notes (Signed)
Patient placed in prone position, has been tolerating position well since 1230. No ss of distress noted. VS wnl as charted.

## 2020-08-02 NOTE — Consult Note (Signed)
Consultation Note Date: 08/02/2020   Patient Name: Courtney Anderson  DOB: 19-Jun-1956  MRN: [id number]  Age / Sex: 64 y.o., female   PCP: Guadalupe Maple, MD Referring Physician: Ottie Glazier, MD   REASON FOR CONSULTATION:Establishing goals of care  Palliative Care consult requested for goals of care discussion in this 64 y.o. female with multiple medical problems including morbid obesity, hypertension, hypothyroidism, and dyslipidemia. She presented to the ED via EMS from home with concerns of "not feeling well", cough, congestion, and generalized weakness. Patient was placed on non-rebreather due to oxygenation of 69% which improved into the low 90s. BUN 74, Cr 2.81, and COVID-19 PCR positive. Patient completed remdesivir treatment and continues on IV antibiotics. Patient required intubation due to hypoxia and ARDS. She remains intubated on mechanical ventilation.    Clinical Assessment and Goals of Care: I have reviewed medical records including lab results, imaging, Epic notes, and MAR, received report from the bedside RN, and assessed the patient. I spoke with patient's son, Conception Oms via phone to discuss diagnosis prognosis, [place], EOL wishes, disposition and options.  Ms. Bonanno remains intubated.   I introduced Palliative Medicine as specialized medical care for people living with serious illness. It focuses on providing relief from the symptoms and stress of a serious illness. The goal is to improve quality of life for both the patient and the family. Son verbalized understanding and appreciation.   We discussed a brief life review of the patient, along with her functional and nutritional status. Mr. [name] reports his mother lives in the home with her son. She has 2 sons who are both involved in her care. 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 She enjoys spending time with family and friends. She has a strong [place]  faith belief.   Prior to admission family reports patient was in good health with only minimal health concerns such as her obesity and hypertension. She was ambulatory without assistive devices. Provided all ADL care independently and was driving and caring for her home.   We discussed Her current illness and what it means in the larger context of Her on-going co-morbidities. Natural disease trajectory and expectations at EOL were discussed.  Mr. [name] verbalized understanding of his mother's current illness and the criticalness of her condition. He verbalized "she was fine before she came in the hospital and for several days, she is going to be fine and come out of the hospital!"   Son expressed his concerns with her care and how patient's condition rapidly decline so quickly after receiving positive updates from the provider to within hours-a day receiving not so good updates and requiring ICU and emergent interventions afterwards.   I discussed at length with compassion to patient's expressed feelings, the unknowns of COVID-19 and how this virus can cause sudden changes in patients. Education provided on patient's poor prognosis with assurance the medical team has been providing all appropriate treatments however, her body is not responding as he expressed hopes for.   Son verbalized his now understanding of the disease trajectory. He speaks of their strong [place] faith and prayers that God will perform miracles on his mother's behalf. He states he and his family (brother and aunt) are focusing on prayer and the positive mindset that patient will "pull through" and just needs time. He does not wish to focus on the negatives or the "what ifs". Therapeutic listening and support provided.  I attempted to elicit values and goals of care important to the patient.   I shared with son that medical team will be as open and honest with him when providing updates with understanding of his request.  However, to provide the best care for his mother making sure that he and family are aware of her condition and interventions that are taking place, including patient's response is needed to make sure as a family they can make the best decisions on patient's behalf with emphasis on keeping patient's quality of life as the center of their decision making. Mr. [name] verbalized understanding and appreciation of discussion and the medical teams care and support.   Mr. [name] confirms wishes to continue with full aggressive interventions providing any and all care to allow his mother every opportunity to show improvement.    Advanced directives, concepts specific to code status, artifical feeding and hydration, and rehospitalization were considered and discussed. Patient does not have a documented advanced directive. Son reports he has always been her medical support person and he and his brother are in agreement that all contact and discussions are directed to him.   Questions and concerns were addressed. The family was encouraged to call with questions or concerns.  PMT will continue to support holistically.   SOCIAL HISTORY:     reports that she has quit smoking. She has never used smokeless tobacco. She reports current alcohol use. She reports that she does not use drugs.  CODE STATUS: DNR  ADVANCE DIRECTIVES: Primary Decision Maker: Delmus [name] (son)    SYMPTOM MANAGEMENT: per attending   Palliative Prophylaxis:   Aspiration, Frequent Pain Assessment, Oral Care and Turn Reposition  PSYCHO-SOCIAL/SPIRITUAL:  Support System: Family  Desire for further Chaplaincy support:No   Additional Recommendations (Limitations, Scope, Preferences):  Full Scope Treatment    PAST MEDICAL HISTORY: Past Medical History:  Diagnosis Date  . Allergy   . Elevated transaminase level   . Hyperlipidemia   . Hypertension   . Obesity     ALLERGIES:  is allergic to codeine.   MEDICATIONS:  Current  Facility-Administered Medications  Medication Dose Route Frequency Provider Last Rate Last Admin  . acetaminophen (TYLENOL) tablet 650 mg  650 mg Oral Q6H PRN Agbata, Tochukwu, MD   650 mg at 08/01/20 2221   Or  . acetaminophen (TYLENOL) suppository 650 mg  650 mg Rectal Q6H PRN Agbata, Tochukwu, MD      . amiodarone (PACERONE) tablet 200 mg  200 mg Per Tube BID Ottie Glazier, MD   200 mg at 08/02/20 0854  . apixaban (ELIQUIS) tablet 5 mg  5 mg Per Tube BID Ottie Glazier, MD   5 mg at 08/02/20 0855  . ascorbic acid (VITAMIN C) tablet 500 mg  500 mg Oral Daily Agbata, Tochukwu, MD   500 mg at 08/02/20 0855  . aspirin EC tablet 81 mg  81 mg Oral Daily Agbata, Tochukwu, MD   81 mg at 08/02/20 0854  . baricitinib (OLUMIANT) tablet 4 mg  4 mg Oral Daily Benita Gutter, RPH   4 mg at 08/02/20 0854  . ceFEPIme (MAXIPIME) 2 g in sodium chloride 0.9 % 100 mL IVPB  2 g Intravenous Q12H Ottie Glazier, MD 200 mL/hr at 08/02/20 0852 2 g at 08/02/20 0852  . chlorhexidine gluconate (MEDLINE KIT) (PERIDEX) 0.12 % solution 15 mL  15 mL Mouth Rinse BID Cheryll Dessert, MD   15 mL at 08/02/20 0859  . Chlorhexidine Gluconate Cloth 2 %  PADS 6 each  6 each Topical Daily Tyler Pita, MD   6 each at 08/02/20 0900  . cholecalciferol (VITAMIN D3) tablet 2,000 Units  2,000 Units Oral Daily Agbata, Tochukwu, MD   2,000 Units at 08/02/20 0854  . dexmedetomidine (PRECEDEX) 400 MCG/100ML (4 mcg/mL) infusion  0.4-1.2 mcg/kg/hr Intravenous Titrated Ottie Glazier, MD 32.7 mL/hr at 08/02/20 1138 1.2 mcg/kg/hr at 08/02/20 1138  . feeding supplement (PROSource TF) liquid 45 mL  45 mL Per Tube TID Ottie Glazier, MD   45 mL at 08/02/20 0851  . feeding supplement (VITAL HIGH PROTEIN) liquid 1,000 mL  1,000 mL Per Tube Continuous Aleskerov, Fuad, MD 20 mL/hr at 08/01/20 2222 1,000 mL at 08/01/20 2222  . fentaNYL 2571mg in NS 2555m(1062mml) infusion-PREMIX  0-400 mcg/hr Intravenous Continuous HalHart Robinsons RPH 20  mL/hr at 08/02/20 0600 200 mcg/hr at 08/02/20 0600  . Gerhardt's butt cream   Topical TID AgbCollier BullockD   Given at 08/02/20 0900  . guaiFENesin-dextromethorphan (ROBITUSSIN DM) 100-10 MG/5ML syrup 10 mL  10 mL Oral Q4H PRN Agbata, Tochukwu, MD      . haloperidol lactate (HALDOL) injection 5 mg  5 mg Intravenous Q6H PRN PatPara SkeansD      . HYDROcodone-acetaminophen (NORCO/VICODIN) 5-325 MG per tablet 1 tablet  1 tablet Oral Q6H PRN OumLang SnowP   1 tablet at 07/25/20 0143  . MEDLINE mouth rinse  15 mL Mouth Rinse 10 times per day AkiCheryll DessertD   15 mL at 08/02/20 1211  . metoprolol tartrate (LOPRESSOR) injection 5 mg  5 mg Intravenous Q2H PRN Callwood, Dwayne D, MD   5 mg at 07/30/20 0920  . midazolam (VERSED) 50 mg/50 mL (1 mg/mL) premix infusion  0.5-10 mg/hr Intravenous Continuous GonTyler PitaD      . multivitamin with minerals tablet 1 tablet  1 tablet Oral Daily Agbata, Tochukwu, MD   1 tablet at 08/02/20 0854  . norepinephrine (LEVOPHED) 16 mg in 250m33memix infusion  0-40 mcg/min Intravenous Titrated AlesOttie Glazier   Stopped at 07/31/20 1806  . nystatin (MYCOSTATIN/NYSTOP) topical powder   Topical TID PahwDarliss Cheney   Given at 08/02/20 [id number] (ZOFRAN) tablet 4 mg  4 mg Oral Q6H PRN Agbata, Tochukwu, MD       Or  . ondansetron (ZOFRAN) injection 4 mg  4 mg Intravenous Q6H PRN Agbata, Tochukwu, MD      . pneumococcal 23 valent vaccine (PNEUMOVAX-23) injection 0.5 mL  0.5 mL Intramuscular Tomorrow-1000 Pahwani, Ravi, MD      . predniSONE (DELTASONE) tablet 50 mg  50 mg Oral Daily Agbata, Tochukwu, MD   50 mg at 08/02/20 0854  . propofol (DIPRIVAN) 1000 MG/100ML infusion  5-80 mcg/kg/min Intravenous Titrated GonzTyler Pita 7.13 mL/hr at 08/02/20 0600 15 mcg/kg/min at 08/02/20 0600  . vasopressin (PITRESSIN) 20 Units in sodium chloride 0.9 % 100 mL infusion-*FOR SHOCK*  0-0.03 Units/min Intravenous Continuous AlesOttie GlazierD 9 mL/hr at 08/02/20 0853 0.03 Units/min at 08/02/20 0853  . vecuronium (NORCURON) injection 10 mg  10 mg Intravenous Q1H PRN KeenDarel HongNP      . zinc sulfate capsule 220 mg  220 mg Oral Daily Agbata, Tochukwu, MD   220 mg at 08/02/20 0854    VITAL SIGNS: BP 120/79   Pulse 86   Temp 100 F (37.8 C) (Esophageal)   Resp (!) 30   Ht 5'  5" (1.651 m)   Wt 108.7 kg   SpO2 94%   BMI 39.88 kg/m  Filed Weights   07/30/20 0500 07/31/20 0215 08/02/20 0329  Weight: 79.2 kg 108.9 kg 108.7 kg    Estimated body mass index is 39.88 kg/m as calculated from the following:   Height as of this encounter: _0  (1.651 m).   Weight as of this encounter: 108.7 kg.  LABS: CBC:    Component Value Date/Time   WBC 14.0 (H) 08/02/2020 0430   HGB 12.2 08/02/2020 0430   HGB 12.4 05/13/2018 1134   HCT 37.9 08/02/2020 0430   HCT 37.7 05/13/2018 1134   PLT 107 (L) 08/02/2020 0430   PLT 219 05/13/2018 1134   Comprehensive Metabolic Panel:    Component Value Date/Time   NA 136 08/02/2020 0430   NA 139 10/22/2019 1100   K 5.3 (H) 08/02/2020 0430   BUN 50 (H) 08/02/2020 0430   BUN 13 10/22/2019 1100   CREATININE 2.07 (H) 08/02/2020 0430   ALBUMIN 2.1 (L) 08/02/2020 0430   ALBUMIN 4.2 10/22/2019 1100     Review of Systems  Unable to perform ROS: Intubated   Prognosis: Guarded-POOR   Discharge Planning:  To Be Determined  Recommendations: . DNR/DNI . Continue with current plan of care per medical team  . FULL SCOPE/AGGRESSIVE INTERVENTIONS . Family remains hopeful for improvement despite updates on prognosis. Son expressed he and family only want to focus on the positives and his mother making a recovery. States they are open to any and all options to allow her this opportunity.  Marland Kitchen PMT will continue to support and follow. Please call team line with urgent needs.   Palliative Performance Scale: INTUBATED              Son expressed understanding and was in agreement with this  plan.   Thank you for allowing the Palliative Medicine Team to assist in the care of this patient.  Time In: 1500 Time Out: 1605 Time Total: 65 min.   The above conversation was completed via telephone due to the visitor restrictions during the COVID-19 pandemic. Thorough chart review and discussion with necessary members of the care team was completed as part of assessment. All issues were discussed and addressed but no physical exam was performed.  Visit consisted of counseling and education dealing with the complex and emotionally intense issues of symptom management and palliative care in the setting of serious and potentially life-threatening illness.Greater than 50%  of this time was spent counseling and coordinating care related to the above assessment and plan.  Signed by:  Alda Lea, AGPCNP-BC Palliative Medicine Team  Phone: [phone] Pager: [phone] Amion: Bjorn Pippin

## 2020-08-02 NOTE — Progress Notes (Addendum)
[place], [place] 08/02/20  Subjective:   Hospital day # 9  Patient stays intubated, venilator dependent with FiO2 80%, apperas critically ill.  Renal: 09/28 0701 - 09/29 0700 In: 2284.8 [I.V.:1820.1; NG/GT:264.7; IV Piggyback:200.1] Out: 1900 [Urine:1900] Lab Results  Component Value Date   CREATININE 2.07 (H) 08/02/2020   CREATININE 2.27 (H) 08/01/2020   CREATININE 2.06 (H) 07/31/2020     Objective:  Vital signs in last 24 hours:  Temp:  [100 F (37.8 C)-102 F (38.9 C)] 100 F (37.8 C) (09/29 0600) Pulse Rate:  [60-103] 86 (09/29 0600) Resp:  [28-30] 30 (09/29 0600) BP: (105-139)/(50-89) 120/79 (09/29 0600) SpO2:  [92 %-98 %] 94 % (09/29 0600) FiO2 (%):  [80 %-90 %] 80 % (09/29 1100) Weight:  [108.7 kg] 108.7 kg (09/29 0329)  Weight change:  Filed Weights   07/30/20 0500 07/31/20 0215 08/02/20 0329  Weight: 79.2 kg 108.9 kg 108.7 kg    Intake/Output:    Intake/Output Summary (Last 24 hours) at 08/02/2020 1402 Last data filed at 08/02/2020 1300 Gross per 24 hour  Intake 2704.09 ml  Output 1975 ml  Net 729.09 ml     Physical Exam: General:  Appears Critically ill,intubated and sedated  HEENT  ETT and OGT in place  Pulm/lungs  Lungs with crackles at the bases  CVS/Heart  S1S2 , Regular  Abdomen:   Soft, nondistended  Extremities:  Peripheral edema +  Neurologic:  Sedated  Skin:  No acute rashes    Basic Metabolic Panel:  Recent Labs  Lab 07/27/20 0548 07/27/20 0548 07/28/20 0501 07/28/20 0501 07/29/20 0620 07/29/20 0620 07/30/20 0448 07/30/20 0448 07/31/20 0653 08/01/20 0500 08/02/20 0430  NA 138   < > 138   < > 140  --  139  --  138 136 136  K 3.1*   < > 3.7   < > 3.7  --  3.8  --  4.7 5.1 5.3*  CL 105   < > 104   < > 104  --  101  --  99 101 102  CO2 22   < > 23   < > 26  --  27  --  '28 25 26  ' GLUCOSE 180*   < > 183*   < > 132*  --  143*  --  145* 160* 165*  BUN 29*   < > 18   < > 16  --  18  --  32* 43*  50*  CREATININE 0.84   < > 0.84   < > 0.88  --  0.77  --  2.06* 2.27* 2.07*  CALCIUM 9.9   < > 9.8   < > 9.8   < > 9.4   < > 10.1 9.4 9.5  MG 1.8   < > 2.1   < > 1.7  --  1.9  --  2.4 2.1 2.1  PHOS 1.9*  --  2.0*  --  2.5  --   --   --   --  5.0* 4.4   < > = values in this interval not displayed.     CBC: Recent Labs  Lab 07/27/20 0548 07/28/20 0501 07/29/20 0620 07/30/20 0449 08/02/20 0430  WBC 12.8* 12.4* 20.7* 19.0* 14.0*  NEUTROABS 10.9* 10.3* 18.4*  --  12.7*  HGB 14.8 14.9 15.4* 15.4* 12.2  HCT 44.5 44.3 46.6* 45.4 37.9  MCV 84.9 85.0 85.2 83.6 86.3  PLT 460* 436* 429* 387 107*  No results found for: HEPBSAG, HEPBSAB, HEPBIGM    Microbiology:  Recent Results (from the past 240 hour(s))  Urine culture     Status: Abnormal   Collection Time: 07/24/20  9:12 AM   Specimen: Urine, Random  Result Value Ref Range Status   Specimen Description   Final    URINE, RANDOM Performed at [facility], [address] [place], [place] [address]    Special Requests   Final    NONE Performed at [facility], [address], [place], [place] [address]    Culture (A)  Final    <10,000 COLONIES/mL >=100,000 COLONIES/mL Performed at [facility], [place] [address] [place], [place] [address]    Report Status 07/25/2020 FINAL  Final  Blood culture (routine single)     Status: None   Collection Time: 07/24/20  9:14 AM   Specimen: BLOOD LEFT ARM  Result Value Ref Range Status   Specimen Description BLOOD LEFT ARM  Final   Special Requests   Final    BOTTLES DRAWN AEROBIC AND ANAEROBIC Blood Culture adequate volume   Culture   Final    NO GROWTH 5 DAYS Performed at [facility], [address] [place], [place] [address]    Report Status 07/29/2020 FINAL  Final  SARS Coronavirus 2 by RT PCR (hospital order, performed in [place] hospital lab) Nasopharyngeal Nasopharyngeal Swab     Status: Abnormal   Collection Time: 07/24/20  9:16 AM    Specimen: Nasopharyngeal Swab  Result Value Ref Range Status   SARS Coronavirus 2 POSITIVE (A) NEGATIVE Final    Comment: RESULT CALLED TO, READ BACK BY AND VERIFIED WITH:  SAMANTHA HAMILTON AT 1423 07/24/20 SDR (NOTE) SARS-CoV-2 target nucleic acids are DETECTED  SARS-CoV-2 RNA is generally detectable in upper respiratory specimens  during the acute phase of infection.  Positive results are indicative  of the presence of the identified virus, but do not rule out bacterial infection or co-infection with other pathogens not detected by the test.  Clinical correlation with patient history and  other diagnostic information is necessary to determine patient infection status.  The expected result is negative.  Fact Sheet for Patients:   StrictlyIdeas.no   Fact Sheet for Healthcare Providers:   BankingDealers.co.za    This test is not yet approved or cleared by the [place] FDA and  has been authorized for detection and/or diagnosis of SARS-CoV-2 by FDA under an Emergency Use Authorization (EUA).  This EUA will remain in effect (meaning thi s test can be used) for the duration of  the COVID-19 declaration under Section 564(b)(1) of the Act, 21 U.S.C. section 360-bbb-3(b)(1), unless the authorization is terminated or revoked sooner.  Performed at [facility], [address] [place], [place] [address]   MRSA PCR Screening     Status: None   Collection Time: 07/30/20 11:08 AM   Specimen: Nasopharyngeal  Result Value Ref Range Status   MRSA by PCR NEGATIVE NEGATIVE Final    Comment:        The GeneXpert MRSA Assay (FDA approved for NASAL specimens only), is one component of a comprehensive MRSA colonization surveillance program. It is not intended to diagnose MRSA infection nor to guide or monitor treatment for MRSA infections. Performed at [facility], [address] [place], [place] [address]    Culture, respiratory (non-expectorated)     Status: None (Preliminary result)   Collection Time: 08/01/20 10:46 AM  Specimen: Tracheal Aspirate; Respiratory  Result Value Ref Range Status   Specimen Description   Final    TRACHEAL ASPIRATE Performed at [facility], [address] [place], [place] [address]    Special Requests   Final    NONE Performed at [facility], [address] [place], [place] [address]    Gram Stain NO WBC SEEN NO ORGANISMS SEEN   Final   Culture   Final    CULTURE REINCUBATED FOR BETTER GROWTH Performed at [facility], [place] [address], [place], [place] [address]    Report Status PENDING  Incomplete    Coagulation Studies: No results for input(s): LABPROT, INR in the last 72 hours.  Urinalysis: No results for input(s): COLORURINE, LABSPEC, PHURINE, GLUCOSEU, HGBUR, BILIRUBINUR, KETONESUR, PROTEINUR, UROBILINOGEN, NITRITE, LEUKOCYTESUR in the last 72 hours.  Invalid input(s): APPERANCEUR    Imaging: DG Abd 1 View  Result Date: 08/01/2020 CLINICAL DATA:  NG placement. EXAM: ABDOMEN - 1 VIEW COMPARISON:  Earlier today FINDINGS: Tip and side port of the enteric tube are now located below the diaphragm in the stomach. Similar bowel-gas pattern tear earlier today. IMPRESSION: Tip and side port of the enteric tube below the diaphragm in the stomach. Electronically Signed   By: Keith Rake M.D.   On: 08/01/2020 21:56   DG Abd 1 View  Result Date: 08/01/2020 CLINICAL DATA:  OG tube placement EXAM: ABDOMEN - 1 VIEW COMPARISON:  07/31/2020 FINDINGS: Esophageal tube tip in the region of GE junction, side-port the level of distal esophagus. Upper abdominal gas pattern is unobstructed. Airspace disease at both bases. IMPRESSION: Esophageal tube tip overlies the GE junction, side-port overlies distal esophagus, consider further advancement by at least 10 cm for more optimal positioning. These results will be called to the ordering  clinician or representative by the Radiologist Assistant, and communication documented in the PACS or [employer]. Electronically Signed   By: Donavan Foil M.D.   On: 08/01/2020 20:22     Medications:   . ceFEPime (MAXIPIME) IV 2 g (08/02/20 0852)  . dexmedetomidine (PRECEDEX) IV infusion 1.2 mcg/kg/hr (08/02/20 1138)  . feeding supplement (VITAL HIGH PROTEIN) 1,000 mL (08/01/20 2222)  . fentaNYL infusion INTRAVENOUS 200 mcg/hr (08/02/20 1254)  . midazolam    . norepinephrine (LEVOPHED) Adult infusion Stopped (07/31/20 1806)  . propofol (DIPRIVAN) infusion 15 mcg/kg/min (08/02/20 1255)  . vasopressin 0.03 Units/min (08/02/20 0853)   . amiodarone  200 mg Per Tube BID  . apixaban  5 mg Per Tube BID  . vitamin C  500 mg Oral Daily  . aspirin EC  81 mg Oral Daily  . baricitinib  4 mg Oral Daily  . chlorhexidine gluconate (MEDLINE KIT)  15 mL Mouth Rinse BID  . Chlorhexidine Gluconate Cloth  6 each Topical Daily  . cholecalciferol  2,000 Units Oral Daily  . feeding supplement (PROSource TF)  45 mL Per Tube TID  . Gerhardt's butt cream   Topical TID  . mouth rinse  15 mL Mouth Rinse 10 times per day  . multivitamin with minerals  1 tablet Oral Daily  . nystatin   Topical TID  . pneumococcal 23 valent vaccine  0.5 mL Intramuscular Tomorrow-1000  . predniSONE  50 mg Oral Daily  . zinc sulfate  220 mg Oral Daily   acetaminophen **OR** acetaminophen, guaiFENesin-dextromethorphan, haloperidol lactate, HYDROcodone-acetaminophen, metoprolol tartrate, ondansetron **OR** ondansetron (ZOFRAN) IV, vecuronium  Assessment/ Plan:  65 y.o. female with hypertension, hyperlipidemia  admitted on 07/24/2020 for Atrial fibrillation with rapid ventricular response ([place]) [I48.91] Sepsis ([place]) [A41.9] Acute renal failure, unspecified acute renal failure type ([place]) [N17.9] Skin ulcer, unspecified ulcer stage ([place]) [L98.499] Acute hypoxemic respiratory failure due to COVID-19 ([place]) [U07.1,  J96.01] COVID-19 [U07.1]   #Acute renal failure Baseline creatinine of 0.77, 9 GFR greater than 60 on July 30, 2020 AKI most likely secondary to hypotension leading to ATN.  IV contrast exposure on 07/28/2020. Lab Results  Component Value Date   CREATININE 2.07 (H) 08/02/2020   CREATININE 2.27 (H) 08/01/2020   CREATININE 2.06 (H) 07/31/2020    Urine output for the preceding 24 hours is adequtae1900 ml Blood pressure  Stable, on vasopressin,levophed Will continue monitoring   #Acute respiratory failure Secondary to COVID-19 pneumonia Continues on mechanical ventilation Management per ICU team     LOS: [place] 9/29/20212:02 PM  [place], [place]  Patient was seen and evaluated with Crosby Oyster, NP She assisted with Transcription of this note.  Note: This note was prepared with Dragon dictation. Any transcription errors are unintentional

## 2020-08-03 LAB — BASIC METABOLIC PANEL
Anion gap: 9 (ref 5–15)
BUN: 56 mg/dL — ABNORMAL HIGH (ref 8–23)
CO2: 26 mmol/L (ref 22–32)
Calcium: 9.5 mg/dL (ref 8.9–10.3)
Chloride: 104 mmol/L (ref 98–111)
Creatinine, Ser: 1.65 mg/dL — ABNORMAL HIGH (ref 0.44–1.00)
GFR calc Af Amer: 37 mL/min — ABNORMAL LOW (ref >60)
GFR calc non Af Amer: 32 mL/min — ABNORMAL LOW (ref >60)
Glucose, Bld: 182 mg/dL — ABNORMAL HIGH (ref 70–99)
Potassium: 4.8 mmol/L (ref 3.5–5.1)
Sodium: 139 mmol/L (ref 135–145)

## 2020-08-03 LAB — GLUCOSE, CAPILLARY
Glucose-Capillary: 160 mg/dL — ABNORMAL HIGH (ref 70–99)
Glucose-Capillary: 177 mg/dL — ABNORMAL HIGH (ref 70–99)
Glucose-Capillary: 185 mg/dL — ABNORMAL HIGH (ref 70–99)
Glucose-Capillary: 188 mg/dL — ABNORMAL HIGH (ref 70–99)
Glucose-Capillary: 190 mg/dL — ABNORMAL HIGH (ref 70–99)
Glucose-Capillary: 195 mg/dL — ABNORMAL HIGH (ref 70–99)

## 2020-08-03 LAB — CULTURE, RESPIRATORY W GRAM STAIN
Culture: NORMAL
Gram Stain: NONE SEEN

## 2020-08-03 LAB — HEMOGLOBIN A1C
Hgb A1c MFr Bld: 7.1 % — ABNORMAL HIGH (ref 4.8–5.6)
Mean Plasma Glucose: 157.07 mg/dL

## 2020-08-03 LAB — PHOSPHORUS: Phosphorus: 3.6 mg/dL (ref 2.5–4.6)

## 2020-08-03 LAB — MAGNESIUM: Magnesium: 2.1 mg/dL (ref 1.7–2.4)

## 2020-08-03 MED ORDER — DOCUSATE SODIUM 50 MG/5ML PO LIQD
50.0000 mg | Freq: Two times a day (BID) | ORAL | Status: DC
  Administered 2020-08-03 – 2020-08-08 (×9): 50 mg
  Filled 2020-08-03 (×10): qty 10

## 2020-08-03 MED ORDER — INSULIN ASPART 100 UNIT/ML ~~LOC~~ SOLN
0.0000 [IU] | SUBCUTANEOUS | Status: DC
  Administered 2020-08-03 – 2020-08-04 (×7): 2 [IU] via SUBCUTANEOUS
  Administered 2020-08-04 (×2): 3 [IU] via SUBCUTANEOUS
  Administered 2020-08-04: 2 [IU] via SUBCUTANEOUS
  Administered 2020-08-04: 3 [IU] via SUBCUTANEOUS
  Administered 2020-08-04: 2 [IU] via SUBCUTANEOUS
  Administered 2020-08-05 (×2): 3 [IU] via SUBCUTANEOUS
  Administered 2020-08-05: 1 [IU] via SUBCUTANEOUS
  Administered 2020-08-05: 2 [IU] via SUBCUTANEOUS
  Administered 2020-08-05: 5 [IU] via SUBCUTANEOUS
  Administered 2020-08-05: 2 [IU] via SUBCUTANEOUS
  Administered 2020-08-06 (×2): 3 [IU] via SUBCUTANEOUS
  Administered 2020-08-06 (×2): 2 [IU] via SUBCUTANEOUS
  Administered 2020-08-06: 3 [IU] via SUBCUTANEOUS
  Administered 2020-08-07: 5 [IU] via SUBCUTANEOUS
  Administered 2020-08-07 (×2): 2 [IU] via SUBCUTANEOUS
  Administered 2020-08-07: 3 [IU] via SUBCUTANEOUS
  Administered 2020-08-07: 1 [IU] via SUBCUTANEOUS
  Administered 2020-08-07: 5 [IU] via SUBCUTANEOUS
  Administered 2020-08-07: 3 [IU] via SUBCUTANEOUS
  Administered 2020-08-08: 5 [IU] via SUBCUTANEOUS
  Administered 2020-08-08 (×2): 3 [IU] via SUBCUTANEOUS
  Administered 2020-08-08: 5 [IU] via SUBCUTANEOUS
  Administered 2020-08-08 – 2020-08-09 (×3): 2 [IU] via SUBCUTANEOUS
  Administered 2020-08-09: 1 [IU] via SUBCUTANEOUS
  Administered 2020-08-09 – 2020-08-10 (×3): 2 [IU] via SUBCUTANEOUS
  Administered 2020-08-10 – 2020-08-11 (×4): 1 [IU] via SUBCUTANEOUS
  Administered 2020-08-11: 2 [IU] via SUBCUTANEOUS
  Administered 2020-08-11: 1 [IU] via SUBCUTANEOUS
  Administered 2020-08-11: 2 [IU] via SUBCUTANEOUS
  Administered 2020-08-12 – 2020-08-18 (×3): 1 [IU] via SUBCUTANEOUS
  Filled 2020-08-03 (×51): qty 1

## 2020-08-03 NOTE — Progress Notes (Signed)
[place], [place] 08/03/20  Subjective:   Hospital day # 10  Patient stays intubated, venilator dependent with FiO2 50%, appears critically ill.She is sedated with Precedex, Fentanyl, Versed and Propofol. Continues to require levophed and vasopressin. She is getting tube feeds 50 ml/hr  UOP 2600 ml for the preceding 24 hours Renal: 09/29 0701 - 09/30 0700 In: 2495.7 [I.V.:1667.2; NG/GT:628.5; IV Piggyback:200] Out: 2600 [Urine:2600] Lab Results  Component Value Date   CREATININE 1.65 (H) 08/03/2020   CREATININE 2.07 (H) 08/02/2020   CREATININE 2.27 (H) 08/01/2020     Objective:  Vital signs in last 24 hours:  Temp:  [96.4 F (35.8 C)-98.4 F (36.9 C)] 97 F (36.1 C) (09/30 0600) Pulse Rate:  [66-92] 68 (09/30 0600) Resp:  [24-30] 30 (09/30 0600) BP: (122-140)/(73-108) 122/90 (09/30 0600) SpO2:  [87 %-97 %] 92 % (09/30 0802) FiO2 (%):  [40 %-90 %] 50 % (09/30 0802) Weight:  [108.8 kg] 108.8 kg (09/30 0433)  Weight change: 0.1 kg Filed Weights   07/31/20 0215 08/02/20 0329 08/03/20 0433  Weight: 108.9 kg 108.7 kg 108.8 kg    Intake/Output:    Intake/Output Summary (Last 24 hours) at 08/03/2020 0917 Last data filed at 08/03/2020 0600 Gross per 24 hour  Intake 2495.67 ml  Output 2600 ml  Net -104.33 ml     Physical Exam: General:  Appears Critically ill,intubated and sedated  HEENT  ETT and OGT in place  Pulm/lungs  Lungs with crackles at the bases  CVS/Heart  S1S2 ,Irregular  Abdomen:   Soft, nondistended  Extremities:  Peripheral edema +  Neurologic:  Sedated  Skin:  No acute rashes    Basic Metabolic Panel:  Recent Labs  Lab 07/28/20 0501 07/28/20 0501 07/29/20 0620 07/29/20 0620 07/30/20 0448 07/30/20 0448 07/31/20 0653 07/31/20 0653 08/01/20 0500 08/02/20 0430 08/03/20 0430  NA 138   < > 140   < > 139  --  138  --  136 136 139  K 3.7   < > 3.7   < > 3.8  --  4.7  --  5.1 5.3* 4.8  CL 104   < > 104   < > 101   --  99  --  101 102 104  CO2 23   < > 26   < > 27  --  28  --  '25 26 26  ' GLUCOSE 183*   < > 132*   < > 143*  --  145*  --  160* 165* 182*  BUN 18   < > 16   < > 18  --  32*  --  43* 50* 56*  CREATININE 0.84   < > 0.88   < > 0.77  --  2.06*  --  2.27* 2.07* 1.65*  CALCIUM 9.8   < > 9.8   < > 9.4   < > 10.1   < > 9.4 9.5 9.5  MG 2.1   < > 1.7   < > 1.9  --  2.4  --  2.1 2.1 2.1  PHOS 2.0*  --  2.5  --   --   --   --   --  5.0* 4.4 3.6   < > = values in this interval not displayed.     CBC: Recent Labs  Lab 07/28/20 0501 07/29/20 0620 07/30/20 0449 08/02/20 0430  WBC 12.4* 20.7* 19.0* 14.0*  NEUTROABS 10.3* 18.4*  --  12.7*  HGB 14.9 15.4*  15.4* 12.2  HCT 44.3 46.6* 45.4 37.9  MCV 85.0 85.2 83.6 86.3  PLT 436* 429* 387 107*     No results found for: HEPBSAG, HEPBSAB, HEPBIGM    Microbiology:  Recent Results (from the past 240 hour(s))  MRSA PCR Screening     Status: None   Collection Time: 07/30/20 11:08 AM   Specimen: Nasopharyngeal  Result Value Ref Range Status   MRSA by PCR NEGATIVE NEGATIVE Final    Comment:        The GeneXpert MRSA Assay (FDA approved for NASAL specimens only), is one component of a comprehensive MRSA colonization surveillance program. It is not intended to diagnose MRSA infection nor to guide or monitor treatment for MRSA infections. Performed at [facility], [address] [place], [place] [address]   Culture, respiratory (non-expectorated)     Status: None (Preliminary result)   Collection Time: 08/01/20 10:46 AM   Specimen: Tracheal Aspirate; Respiratory  Result Value Ref Range Status   Specimen Description   Final    TRACHEAL ASPIRATE Performed at [facility], [address], [place], [place] [address]    Special Requests   Final    NONE Performed at [facility], [place], [place] [address]    Gram Stain NO WBC SEEN NO ORGANISMS SEEN   Final   Culture   Final    RARE Normal  respiratory flora-no Staph aureus or Pseudomonas seen Performed at [facility] [address], [place],  [address]    Report Status PENDING  Incomplete    Coagulation Studies: No results for input(s): LABPROT, INR in the last 72 hours.  Urinalysis: No results for input(s): COLORURINE, LABSPEC, PHURINE, GLUCOSEU, HGBUR, BILIRUBINUR, KETONESUR, PROTEINUR, UROBILINOGEN, NITRITE, LEUKOCYTESUR in the last 72 hours.  Invalid input(s): APPERANCEUR    Imaging: DG Abd 1 View  Result Date: 08/01/2020 CLINICAL DATA:  NG placement. EXAM: ABDOMEN - 1 VIEW COMPARISON:  Earlier today FINDINGS: Tip and side port of the enteric tube are now located below the diaphragm in the stomach. Similar bowel-gas pattern tear earlier today. IMPRESSION: Tip and side port of the enteric tube below the diaphragm in the stomach. Electronically Signed   By: Keith Rake M.D.   On: 08/01/2020 21:56   DG Abd 1 View  Result Date: 08/01/2020 CLINICAL DATA:  OG tube placement EXAM: ABDOMEN - 1 VIEW COMPARISON:  07/31/2020 FINDINGS: Esophageal tube tip in the region of GE junction, side-port the level of distal esophagus. Upper abdominal gas pattern is unobstructed. Airspace disease at both bases. IMPRESSION: Esophageal tube tip overlies the GE junction, side-port overlies distal esophagus, consider further advancement by at least 10 cm for more optimal positioning. These results will be called to the ordering clinician or representative by the Radiologist Assistant, and communication documented in the PACS or [employer]. Electronically Signed   By: Donavan Foil M.D.   On: 08/01/2020 20:22     Medications:   . ceFEPime (MAXIPIME) IV Stopped (08/02/20 2256)  . dexmedetomidine (PRECEDEX) IV infusion 1.2 mcg/kg/hr (08/03/20 0734)  . feeding supplement (VITAL HIGH PROTEIN) 1,000 mL (08/03/20 8850)  . fentaNYL infusion INTRAVENOUS 200 mcg/hr (08/03/20 0600)  . midazolam    . norepinephrine (LEVOPHED)  Adult infusion Stopped (07/31/20 1806)  . propofol (DIPRIVAN) infusion 20 mcg/kg/min (08/03/20 0600)  . vasopressin 0.03 Units/min (08/03/20 0731)   . amiodarone  200 mg Per Tube BID  . apixaban  5 mg Per Tube BID  .  vitamin C  500 mg Oral Daily  . aspirin EC  81 mg Oral Daily  . baricitinib  4 mg Oral Daily  . chlorhexidine gluconate (MEDLINE KIT)  15 mL Mouth Rinse BID  . Chlorhexidine Gluconate Cloth  6 each Topical Daily  . cholecalciferol  2,000 Units Oral Daily  . feeding supplement (PROSource TF)  45 mL Per Tube TID  . Gerhardt's butt cream   Topical TID  . insulin aspart  0-9 Units Subcutaneous Q4H  . mouth rinse  15 mL Mouth Rinse 10 times per day  . multivitamin with minerals  1 tablet Oral Daily  . nystatin   Topical TID  . pneumococcal 23 valent vaccine  0.5 mL Intramuscular Tomorrow-1000  . predniSONE  50 mg Oral Daily  . zinc sulfate  220 mg Oral Daily   acetaminophen **OR** acetaminophen, guaiFENesin-dextromethorphan, haloperidol lactate, HYDROcodone-acetaminophen, metoprolol tartrate, ondansetron **OR** ondansetron (ZOFRAN) IV, vecuronium  Assessment/ Plan:  64 y.o. female with hypertension, hyperlipidemia    admitted on 07/24/2020 for Atrial fibrillation with rapid ventricular response (HCC) [I48.91] Sepsis ([place]) [A41.9] Acute renal failure, unspecified acute renal failure type ([place]) [N17.9] Skin ulcer, unspecified ulcer stage ([place]) [L98.499] Acute hypoxemic respiratory failure due to COVID-19 ([place]) [U07.1, J96.01] COVID-19 [U07.1]   #Acute renal failure Baseline creatinine of 0.77, 9 GFR greater than 60 on July 30, 2020 AKI most likely secondary to hypotension leading to ATN.  IV contrast exposure on 07/28/2020. Lab Results  Component Value Date   CREATININE 1.65 (H) 08/03/2020   CREATININE 2.07 (H) 08/02/2020   CREATININE 2.27 (H) 08/01/2020     Blood pressure  Stays within acceptable range on vasopressin and levophed    #Acute respiratory  failure Secondary to COVID-19 pneumonia Continues to be dependent on mechanical ventillation Received Remdesivir during this admission And prone positioning Management per ICU team      LOS: [place] 9/30/20219:17 AM  [place], [place]    Note: This note was prepared with Dragon dictation. Any transcription errors are unintentional

## 2020-08-03 NOTE — Progress Notes (Signed)
Inpatient Diabetes Program Recommendations  AACE/ADA: New Consensus Statement on Inpatient Glycemic Control (2015)  Target Ranges:  Prepandial:   less than 140 mg/dL      Peak postprandial:   less than 180 mg/dL (1-2 hours)      Critically ill patients:  140 - 180 mg/dL   Lab Results  Component Value Date   GLUCAP 185 (H) 08/03/2020   HGBA1C 7.1 (H) 08/03/2020    Review of Glycemic Control Results for JAYELYN, BARNO (MRN [id number]) as of 08/03/2020 13:36  Ref. Range 08/03/2020 03:38 08/03/2020 09:06 08/03/2020 11:54  Glucose-Capillary Latest Ref Range: 70 - 99 mg/dL 190 (H) 177 (H) 185 (H)   Diabetes history: Type 2 DM Outpatient Diabetes medications: none Current orders for Inpatient glycemic control: Novolog 0-9 units Q4H Prednisone 50 mg QD  Inpatient Diabetes Program Recommendations:    Also consider adding Tradjenta 5 mg QD.   Thanks, Bronson Curb, MSN, RNC-OB Diabetes Coordinator [phone] (8a-5p)

## 2020-08-03 NOTE — Progress Notes (Signed)
PHARMACY CONSULT NOTE - FOLLOW UP  Pharmacy Consult for Electrolyte Monitoring and Replacement   Recent Labs: Potassium (mmol/L)  Date Value  08/03/2020 4.8   Magnesium (mg/dL)  Date Value  08/03/2020 2.1   Calcium (mg/dL)  Date Value  08/03/2020 9.5   Calcium, Total (PTH) (mg/dL)  Date Value  07/24/2020 10.1   Albumin (g/dL)  Date Value  08/02/2020 2.1 (L)  10/22/2019 4.2   Phosphorus (mg/dL)  Date Value  08/03/2020 3.6   Sodium (mmol/L)  Date Value  08/03/2020 139  10/22/2019 139   Assessment: 64 yo female with PMH for HTN and hypotension who was admitted with sepsis, acute respiratory failure, and new onset A. Fib. Patient had hypercalcemia consistent with primary hyperparathyroidism with PTH 118>102 on this admission. Pharmacy consulted for electrolyte monitoring and replacement.   9/28 electrolytes WNL. Scr increased from 2.06>>2.27. Digoxin discontinued s/t elevated level in setting or worsening renal function. Pt requiring pressure support with vasopressin and not requiring any maintenance fluids at this time. Tube feeds started. 9/29 Potassium slightly elevated, phos and mag WNL. No replacement indicated at this time 9/30 Electrolytes WNL, phos trending down but okay for now. Scr trending down with good UOP. Pt still intubated and requiring pressure support. On tube feeds and no maintenance fluids.    Goal of Therapy:  Electrolytes WNL K> 4 Mg >2   Plan:  -No replacement indicated at this time.  -Continue to follow along.  Jacobo Forest PharmD Candidate 2022 08/03/2020 10:15 AM

## 2020-08-03 NOTE — Progress Notes (Signed)
CRITICAL CARE PROGRESS NOTE    Name: Courtney Anderson MRN: [id number] DOB: [birth year]     LOS: 10   SUBJECTIVE FINDINGS & SIGNIFICANT EVENTS    Patient description:  Reason for Consult: Severe respiratory distress and patient with COVID-19 pneumonia. Referring Physician: Florina Ou, MD  Courtney Anderson is an 64 y.o. female.  HPI: Patient is a 64 year old female with history noted below, who was admitted to [facility] on 2020-07-24 brought in via EMS as a "code sepsis".  Patient subsequently tested positive for COVID-19.  She presented with oxygen saturations of 69% on room air and had to be placed on nonrebreather mask to achieve oxygen saturations in the 90s.  She was subsequently admitted to the Covid unit for management of COVID-19 pneumonia.  She was noted to have acute kidney injury she was treated with remdesivir, zinc, vitamin C and antitussives.  She was placed empirically on Rocephin and azithromycin.  She continued to have increased FiO2 requirements.  She was refusing to self prone.  She had atrial fibrillation and was seen by cardiology to assist in management.   07/31/20- patient is on 90% FiO2, will modify meds as patient is on metoprolol and levophed as well as cardizem.  Will d/c propofol to improve SBP and remove arythmogenic vasopressor if able.  Plan to have CVP trending and diuresis today.   Goals of care discussion today - patient made DNR per sister and son of patient. I had 2 separate phone meetings with son and separate conversation with sister today to explain patient is worse with shock and renal failure requiring multiple pressors.   08/01/20- patient remains critically ill. FiO2 has been weaned down to 80% today.  08/03/20- patient remains critically ill shes being proned, weaning FiO2 as able.    Lines/tubes : Airway 7.5 mm (Active)  Secured at (cm) 23 cm 07/31/20 0806  Measured From Lips 07/31/20 Oil Trough 07/31/20 0806  Secured By Courtney Anderson 07/31/20 0806  Tube Holder Repositioned Yes 07/31/20 0806  Cuff Pressure (cm H2O) 26 cm H2O 07/31/20 0806  Site Condition Dry 07/31/20 0806     CVC Triple Lumen 07/30/20 Left Internal jugular (Active)  Indication for Insertion or Continuance of Line Vasoactive infusions 07/30/20 2010  Site Assessment Clean;Dry;Intact 07/30/20 2010  Proximal Lumen Status Infusing 07/30/20 2010  Medial Lumen Status Infusing 07/30/20 2010  Distal Lumen Status Infusing 07/30/20 2010  Dressing Type Transparent;Occlusive 07/30/20 2010  Dressing Status Clean;Dry;Intact 07/30/20 2010  Line Care Connections checked and tightened 07/30/20 2010  Dressing Change Due 08/06/20 07/30/20 2010     NG/OG Tube [facility] mouth Xray Documented cm marking at nare/ corner of mouth 75 cm (Active)  Cm Marking at Nare/Corner of Mouth (if applicable) 75 cm 40/98/11 2010  Site Assessment Clean;Dry;Intact 07/30/20 2010  Ongoing Placement Verification No change in cm markings or external length of tube from initial placement;No acute changes, not attributed to clinical condition;No change in respiratory status;Xray 07/30/20 2010  Status Suction-low intermittent 07/30/20 2010  Amount of suction 80 mmHg 07/30/20 2010  Drainage Appearance Owens Shark;Coffee ground 07/30/20 2010     Urethral Catheter Courtney Anderson Double-lumen;Latex 16 Fr. (Active)  Indication for Insertion or Continuance of Catheter Unstable critically ill patients first 24-48 hours (See Criteria) 07/31/20 0752  Site Assessment Clean;Intact 07/30/20 2000  Catheter Maintenance Bag below level of bladder;Catheter secured;Drainage bag/tubing not touching floor;Insertion date on drainage bag;No dependent loops;Seal intact 07/30/20 2000  Collection Container Standard drainage  bag  07/30/20 2000  Securement Method Securing device (Describe) 07/30/20 2000  Urinary Catheter Interventions (if applicable) Unclamped 46/96/29 2000  Output (mL) 75 mL 07/31/20 0600    Microbiology/Sepsis markers: Results for orders placed or performed during the hospital encounter of 07/24/20  Urine culture     Status: Abnormal   Collection Time: 07/24/20  9:12 AM   Specimen: Urine, Random  Result Value Ref Range Status   Specimen Description   Final    URINE, RANDOM Performed at [facility], [address], [place], [place] [address]    Special Requests   Final    NONE Performed at [facility], [address] [place], [place] [address]    Culture (A)  Final    <10,000 COLONIES/mL >=100,000 COLONIES/mL Performed at [facility], [place] [address] [place], [place] [address]    Report Status 07/25/2020 FINAL  Final  Blood culture (routine single)     Status: None   Collection Time: 07/24/20  9:14 AM   Specimen: BLOOD LEFT ARM  Result Value Ref Range Status   Specimen Description BLOOD LEFT ARM  Final   Special Requests   Final    BOTTLES DRAWN AEROBIC AND ANAEROBIC Blood Culture adequate volume   Culture   Final    NO GROWTH 5 DAYS Performed at [facility], [address] [place], [place] [address]    Report Status 07/29/2020 FINAL  Final  SARS Coronavirus 2 by RT PCR (hospital order, performed in [place] hospital lab) Nasopharyngeal Nasopharyngeal Swab     Status: Abnormal   Collection Time: 07/24/20  9:16 AM   Specimen: Nasopharyngeal Swab  Result Value Ref Range Status   SARS Coronavirus 2 POSITIVE (A) NEGATIVE Final    Comment: RESULT CALLED TO, READ BACK BY AND VERIFIED WITH:  Courtney Anderson AT 4034 07/24/20 SDR (NOTE) SARS-CoV-2 target nucleic acids are DETECTED  SARS-CoV-2 RNA is generally detectable in upper respiratory specimens  during the acute phase of infection.  Positive results are indicative  of the presence of  the identified virus, but do not rule out bacterial infection or co-infection with other pathogens not detected by the test.  Clinical correlation with patient history and  other diagnostic information is necessary to determine patient infection status.  The expected result is negative.  Fact Sheet for Patients:   StrictlyIdeas.no   Fact Sheet for Healthcare Providers:   BankingDealers.co.za    This test is not yet approved or cleared by the [place] FDA and  has been authorized for detection and/or diagnosis of SARS-CoV-2 by FDA under an Emergency Use Authorization (EUA).  This EUA will remain in effect (meaning thi s test can be used) for the duration of  the COVID-19 declaration under Section 564(b)(1) of the Act, 21 U.S.C. section 360-bbb-3(b)(1), unless the authorization is terminated or revoked sooner.  Performed at [facility], [address] [place], [place] [address]   MRSA PCR Screening     Status: None   Collection Time: 07/30/20 11:08 AM   Specimen: Nasopharyngeal  Result Value Ref Range Status   MRSA by PCR NEGATIVE NEGATIVE Final    Comment:        The GeneXpert MRSA Assay (FDA approved for NASAL specimens only), is one component of a comprehensive MRSA colonization surveillance program. It is not intended to diagnose MRSA infection nor to guide or monitor treatment for MRSA infections. Performed at [facility], [place],  [place], [place] [address]   Culture, respiratory (non-expectorated)     Status: None   Collection Time: 08/01/20 10:46 AM   Specimen: Tracheal Aspirate; Respiratory  Result Value Ref Range Status   Specimen Description   Final    TRACHEAL ASPIRATE Performed at [facility], [address] [place], [place] [address]    Special Requests   Final    NONE Performed at [facility], [place], [place] [address]    Gram Stain  NO WBC SEEN NO ORGANISMS SEEN   Final   Culture   Final    RARE Normal respiratory flora-no Staph aureus or Pseudomonas seen Performed at [facility] [address], [place], [place] [address]    Report Status 08/03/2020 FINAL  Final    Anti-infectives:  Anti-infectives (From admission, onward)   Start     Dose/Rate Route Frequency Ordered Stop   08/01/20 1200  ceFEPIme (MAXIPIME) 2 g in sodium chloride 0.9 % 100 mL IVPB  Status:  Discontinued        2 g 200 mL/hr over 30 Minutes Intravenous Every 12 hours 08/01/20 1026 08/03/20 1014   07/25/20 1000  azithromycin (ZITHROMAX) 500 mg in sodium chloride 0.9 % 250 mL IVPB  Status:  Discontinued        500 mg 250 mL/hr over 60 Minutes Intravenous Every 24 hours 07/24/20 1108 07/24/20 1317   07/25/20 1000  remdesivir 100 mg in sodium chloride 0.9 % 100 mL IVPB       "Followed by" Linked Group Details   100 mg 200 mL/hr over 30 Minutes Intravenous Daily 07/24/20 1112 07/28/20 1544   07/24/20 1300  fluconazole (DIFLUCAN) tablet 100 mg  Status:  Discontinued        100 mg Oral Daily 07/24/20 1251 07/25/20 1522   07/24/20 1200  remdesivir 200 mg in sodium chloride 0.9% 250 mL IVPB       "Followed by" Linked Group Details   200 mg 580 mL/hr over 30 Minutes Intravenous Once 07/24/20 1112 07/24/20 1424   07/24/20 1115  cefTRIAXone (ROCEPHIN) 2 g in sodium chloride 0.9 % 100 mL IVPB  Status:  Discontinued        2 g 200 mL/hr over 30 Minutes Intravenous Every 24 hours 07/24/20 1108 07/24/20 1113   07/24/20 1000  cefTRIAXone (ROCEPHIN) 2 g in sodium chloride 0.9 % 100 mL IVPB  Status:  Discontinued        2 g 200 mL/hr over 30 Minutes Intravenous Every 24 hours 07/24/20 0947 07/28/20 1438   07/24/20 1000  azithromycin (ZITHROMAX) 500 mg in sodium chloride 0.9 % 250 mL IVPB  Status:  Discontinued        500 mg 250 mL/hr over 60 Minutes Intravenous Every 24 hours 07/24/20 0947 07/28/20 1438       Consults: Treatment Team:   Yolonda Kida, MD Dionisio David, MD Murlean Iba, MD     PAST MEDICAL HISTORY   Past Medical History:  Diagnosis Date  . Allergy   . Elevated transaminase level   . Hyperlipidemia   . Hypertension   . Obesity      SURGICAL HISTORY   Past Surgical History:  Procedure Laterality Date  . BREAST SURGERY    . ECTOPIC PREGNANCY SURGERY       FAMILY HISTORY   Family History  Problem Relation Age of Onset  . Hypertension Father   . Stroke Father   . Hypertension  Mother   . Arthritis Mother   . Aneurysm Sister   . Hypertension Sister   . Hypertension Brother   . Hypertension Sister   . Hypertension Brother      SOCIAL HISTORY   Social History   Tobacco Use  . Smoking status: Former Research scientist (life sciences)  . Smokeless tobacco: Never Used  Substance Use Topics  . Alcohol use: Yes    Alcohol/week: 0.0 standard drinks    Comment: on occasion  . Drug use: No     MEDICATIONS   Current Medication:  Current Facility-Administered Medications:  .  acetaminophen (TYLENOL) tablet 650 mg, 650 mg, Oral, Q6H PRN, 650 mg at 08/01/20 2221 **OR** acetaminophen (TYLENOL) suppository 650 mg, 650 mg, Rectal, Q6H PRN, Agbata, Tochukwu, MD .  amiodarone (PACERONE) tablet 200 mg, 200 mg, Per Tube, BID, Lanney Gins, Keianna Signer, MD, 200 mg at 08/03/20 0908 .  apixaban (ELIQUIS) tablet 5 mg, 5 mg, Per Tube, BID, Lanney Gins, Bethanne Mule, MD, 5 mg at 08/03/20 0908 .  ascorbic acid (VITAMIN C) tablet 500 mg, 500 mg, Oral, Daily, Agbata, Tochukwu, MD, 500 mg at 08/03/20 0908 .  aspirin EC tablet 81 mg, 81 mg, Oral, Daily, Agbata, Tochukwu, MD, 81 mg at 08/03/20 0908 .  baricitinib (OLUMIANT) tablet 4 mg, 4 mg, Oral, Daily, Benita Gutter, RPH, 4 mg at 08/03/20 0908 .  chlorhexidine gluconate (MEDLINE KIT) (PERIDEX) 0.12 % solution 15 mL, 15 mL, Mouth Rinse, BID, Akingbade, Jimi O, MD, 15 mL at 08/03/20 0909 .  Chlorhexidine Gluconate Cloth 2 % PADS 6 each, 6 each, Topical, Daily, Tyler Pita, MD, 6  each at 08/03/20 [phone] .  cholecalciferol (VITAMIN D3) tablet 2,000 Units, 2,000 Units, Oral, Daily, Agbata, Tochukwu, MD, 2,000 Units at 08/03/20 0908 .  dexmedetomidine (PRECEDEX) 400 MCG/100ML (4 mcg/mL) infusion, 0.4-1.2 mcg/kg/hr, Intravenous, Titrated, Reiana Poteet, MD, Last Rate: 27.2 mL/hr at 08/03/20 1353, 1 mcg/kg/hr at 08/03/20 1353 .  docusate (COLACE) 50 MG/5ML liquid 50 mg, 50 mg, Per Tube, BID, Modest Draeger, MD, 50 mg at 08/03/20 1214 .  feeding supplement (PROSource TF) liquid 45 mL, 45 mL, Per Tube, TID, Lanney Gins, Korryn Pancoast, MD, 45 mL at 08/03/20 0907 .  feeding supplement (VITAL HIGH PROTEIN) liquid 1,000 mL, 1,000 mL, Per Tube, Continuous, Patrici Minnis, MD, Last Rate: 50 mL/hr at 08/03/20 0613, 1,000 mL at 08/03/20 0613 .  fentaNYL 2549mg in NS 2585m(1061mml) infusion-PREMIX, 0-400 mcg/hr, Intravenous, Continuous, Hall, Scott A, RPH, Last Rate: 20 mL/hr at 08/03/20 1351, 200 mcg/hr at 08/03/20 1351 .  Gerhardt's butt cream, , Topical, TID, Agbata, Tochukwu, MD, Given at 08/03/20 0907 .  guaiFENesin-dextromethorphan (ROBITUSSIN DM) 100-10 MG/5ML syrup 10 mL, 10 mL, Oral, Q4H PRN, Agbata, Tochukwu, MD .  haloperidol lactate (HALDOL) injection 5 mg, 5 mg, Intravenous, Q6H PRN, PatPara SkeansD .  HYDROcodone-acetaminophen (NORCO/VICODIN) 5-325 MG per tablet 1 tablet, 1 tablet, Oral, Q6H PRN, OumLang SnowP, 1 tablet at 07/25/20 0143 .  insulin aspart (novoLOG) injection 0-9 Units, 0-9 Units, Subcutaneous, Q4H, BlaAwilda BillP, 2 Units at 08/03/20 1214 .  MEDLINE mouth rinse, 15 mL, Mouth Rinse, 10 times per day, Akingbade, JimSamuella CotaD, 15 mL at 08/03/20 1353 .  metoprolol tartrate (LOPRESSOR) injection 5 mg, 5 mg, Intravenous, Q2H PRN, Callwood, Dwayne D, MD, 5 mg at 07/30/20 0920 .  midazolam (VERSED) 50 mg/50 mL (1 mg/mL) premix infusion, 0.5-10 mg/hr, Intravenous, Continuous, GonTyler PitaD .  multivitamin with minerals tablet 1 tablet, 1 tablet,  Oral, Daily, Agbata, Tochukwu, MD, 1 tablet at 08/03/20 0908 .  norepinephrine (LEVOPHED) 16 mg in 264m premix infusion, 0-40 mcg/min, Intravenous, Titrated, AOttie Glazier MD, Stopped at 07/31/20 1806 .  nystatin (MYCOSTATIN/NYSTOP) topical powder, , Topical, TID, PDarliss Cheney MD, Given at 08/03/20 [phone].  ondansetron (ZOFRAN) tablet 4 mg, 4 mg, Oral, Q6H PRN **OR** ondansetron (ZOFRAN) injection 4 mg, 4 mg, Intravenous, Q6H PRN, Agbata, Tochukwu, MD .  pneumococcal 23 valent vaccine (PNEUMOVAX-23) injection 0.5 mL, 0.5 mL, Intramuscular, Tomorrow-1000, Pahwani, Ravi, MD .  [COMPLETED] methylPREDNISolone sodium succinate (SOLU-MEDROL) 40 mg/mL injection 39.6 mg, 0.5 mg/kg, Intravenous, Q12H, 39.6 mg at 07/27/20 0542 **FOLLOWED BY** predniSONE (DELTASONE) tablet 50 mg, 50 mg, Oral, Daily, ALanney Gins , MD, 50 mg at 08/03/20 0908 .  propofol (DIPRIVAN) 1000 MG/100ML infusion, 5-80 mcg/kg/min, Intravenous, Titrated, GTyler Pita MD, Last Rate: 9.5 mL/hr at 08/03/20 1037, 20 mcg/kg/min at 08/03/20 1037 .  vasopressin (PITRESSIN) 20 Units in sodium chloride 0.9 % 100 mL infusion-*FOR SHOCK*, 0-0.03 Units/min, Intravenous, Continuous, , , MD, Last Rate: 9 mL/hr at 08/03/20 0731, 0.03 Units/min at 08/03/20 0731 .  vecuronium (NORCURON) injection 10 mg, 10 mg, Intravenous, Q1H PRN, KDarel HongD, NP .  zinc sulfate capsule 220 mg, 220 mg, Oral, Daily, Agbata, Tochukwu, MD, 220 mg at 08/03/20 0908    ALLERGIES   Codeine    REVIEW OF SYSTEMS    Unable to perform due to Sedation on MV  PHYSICAL EXAMINATION   Vital Signs: Temp:  [96.4 F (35.8 C)-98.4 F (36.9 C)] 97 F (36.1 C) (09/30 0600) Pulse Rate:  [66-92] 90 (09/30 0800) Resp:  [24-30] 30 (09/30 0600) BP: (122-140)/(73-108) 122/90 (09/30 0600) SpO2:  [87 %-97 %] 92 % (09/30 0802) FiO2 (%):  [50 %-90 %] 50 % (09/30 1357) Weight:  [108.8 kg] 108.8 kg (09/30 0433)  GENERAL:intubated on MV age  appropriate HEAD: Normocephalic, atraumatic.  EYES: Pupils equal, round, reactive to light.  No scleral icterus.  MOUTH: Moist mucosal membrane. NECK: Supple. No thyromegaly. No nodules. No JVD.  PULMONARY: crackles at bases difficult to appreciate due to MV CARDIOVASCULAR: S1 and S2. Regular rate and rhythm. No murmurs, rubs, or gallops.  GASTROINTESTINAL: Soft, nontender, non-distended. No masses. Positive bowel sounds. No hepatosplenomegaly.  MUSCULOSKELETAL: No swelling, clubbing, or edema.  NEUROLOGIC: Mild distress due to acute illness SKIN:intact,warm,dry   PERTINENT DATA     Infusions: . dexmedetomidine (PRECEDEX) IV infusion 1 mcg/kg/hr (08/03/20 1353)  . feeding supplement (VITAL HIGH PROTEIN) 1,000 mL (08/03/20 03903  . fentaNYL infusion INTRAVENOUS 200 mcg/hr (08/03/20 1351)  . midazolam    . norepinephrine (LEVOPHED) Adult infusion Stopped (07/31/20 1806)  . propofol (DIPRIVAN) infusion 20 mcg/kg/min (08/03/20 1037)  . vasopressin 0.03 Units/min (08/03/20 0731)   Scheduled Medications: . amiodarone  200 mg Per Tube BID  . apixaban  5 mg Per Tube BID  . vitamin C  500 mg Oral Daily  . aspirin EC  81 mg Oral Daily  . baricitinib  4 mg Oral Daily  . chlorhexidine gluconate (MEDLINE KIT)  15 mL Mouth Rinse BID  . Chlorhexidine Gluconate Cloth  6 each Topical Daily  . cholecalciferol  2,000 Units Oral Daily  . docusate  50 mg Per Tube BID  . feeding supplement (PROSource TF)  45 mL Per Tube TID  . Gerhardt's butt cream   Topical TID  . insulin aspart  0-9 Units Subcutaneous Q4H  . mouth rinse  15 mL Mouth Rinse 10 times per  day  . multivitamin with minerals  1 tablet Oral Daily  . nystatin   Topical TID  . pneumococcal 23 valent vaccine  0.5 mL Intramuscular Tomorrow-1000  . predniSONE  50 mg Oral Daily  . zinc sulfate  220 mg Oral Daily   PRN Medications: acetaminophen **OR** acetaminophen, guaiFENesin-dextromethorphan, haloperidol lactate,  HYDROcodone-acetaminophen, metoprolol tartrate, ondansetron **OR** ondansetron (ZOFRAN) IV, vecuronium Hemodynamic parameters:   Intake/Output: 09/29 0701 - 09/30 0700 In: 2495.7 [I.V.:1667.2; NG/GT:628.5; IV Piggyback:200] Out: 2600 [Urine:2600]  Ventilator  Settings: Vent Mode: PRVC FiO2 (%):  [50 %-90 %] 50 % Set Rate:  [30 bmp] 30 bmp Vt Set:  [430 mL] 430 mL PEEP:  [10 cmH20] 10 cmH20 Plateau Pressure:  [24 cmH20] 24 cmH20     LAB RESULTS:  Basic Metabolic Panel: Recent Labs  Lab 07/28/20 0501 07/28/20 0501 07/29/20 0620 07/29/20 0620 07/30/20 0448 07/30/20 0448 07/31/20 0653 07/31/20 0653 08/01/20 0500 08/01/20 0500 08/02/20 0430 08/03/20 0430  NA 138   < > 140   < > 139  --  138  --  136  --  136 139  K 3.7   < > 3.7   < > 3.8   < > 4.7   < > 5.1   < > 5.3* 4.8  CL 104   < > 104   < > 101  --  99  --  101  --  102 104  CO2 23   < > 26   < > 27  --  28  --  25  --  26 26  GLUCOSE 183*   < > 132*   < > 143*  --  145*  --  160*  --  165* 182*  BUN 18   < > 16   < > 18  --  32*  --  43*  --  50* 56*  CREATININE 0.84   < > 0.88   < > 0.77  --  2.06*  --  2.27*  --  2.07* 1.65*  CALCIUM 9.8   < > 9.8   < > 9.4  --  10.1  --  9.4  --  9.5 9.5  MG 2.1   < > 1.7   < > 1.9  --  2.4  --  2.1  --  2.1 2.1  PHOS 2.0*  --  2.5  --   --   --   --   --  5.0*  --  4.4 3.6   < > = values in this interval not displayed.   Liver Function Tests: Recent Labs  Lab 07/28/20 0501 07/29/20 0620 08/02/20 0430  AST 27 34  --   ALT 31 27  --   ALKPHOS 59 73  --   BILITOT 0.4 0.8  --   PROT 6.9 7.0  --   ALBUMIN 2.5* 2.6* 2.1*   No results for input(s): LIPASE, AMYLASE in the last 168 hours. No results for input(s): AMMONIA in the last 168 hours. CBC: Recent Labs  Lab 07/28/20 0501 07/29/20 0620 07/30/20 0449 08/02/20 0430  WBC 12.4* 20.7* 19.0* 14.0*  NEUTROABS 10.3* 18.4*  --  12.7*  HGB 14.9 15.4* 15.4* 12.2  HCT 44.3 46.6* 45.4 37.9  MCV 85.0 85.2 83.6 86.3  PLT  436* 429* 387 107*   Cardiac Enzymes: No results for input(s): CKTOTAL, CKMB, CKMBINDEX, TROPONINI in the last 168 hours. BNP: Invalid input(s): POCBNP CBG: Recent Labs  Lab 08/02/20 2333 08/03/20  0569 08/03/20 0906 08/03/20 1154 08/03/20 1605  GLUCAP 176* 190* 177* 185* 195*       IMAGING RESULTS:  Imaging: DG Abd 1 View  Result Date: 08/01/2020 CLINICAL DATA:  NG placement. EXAM: ABDOMEN - 1 VIEW COMPARISON:  Earlier today FINDINGS: Tip and side port of the enteric tube are now located below the diaphragm in the stomach. Similar bowel-gas pattern tear earlier today. IMPRESSION: Tip and side port of the enteric tube below the diaphragm in the stomach. Electronically Signed   By: Keith Rake M.D.   On: 08/01/2020 21:56   DG Abd 1 View  Result Date: 08/01/2020 CLINICAL DATA:  OG tube placement EXAM: ABDOMEN - 1 VIEW COMPARISON:  07/31/2020 FINDINGS: Esophageal tube tip in the region of GE junction, side-port the level of distal esophagus. Upper abdominal gas pattern is unobstructed. Airspace disease at both bases. IMPRESSION: Esophageal tube tip overlies the GE junction, side-port overlies distal esophagus, consider further advancement by at least 10 cm for more optimal positioning. These results will be called to the ordering clinician or representative by the Radiologist Assistant, and communication documented in the PACS or [employer]. Electronically Signed   By: Donavan Foil M.D.   On: 08/01/2020 20:22    ASSESSMENT AND PLAN    -Multidisciplinary rounds held today Acute respiratory failure with hypoxia due to COVID-19 ARDS COVID-19 pneumonia Proceed with intubation and mechanical ventilation ARDS protocol, 6 cc/kg of PBW Recruitment maneuvers as necessary Prone positioning if no improvement Completed remdesivir Continue Rocephin and azithromycin for now  Atrial fibrillation with RVR Due to physiologic stress/hypoxia Hx: Hyperthyroidism treated with  RAI Continue amiodarone and calcium channel blocker Cardiology following Continue cardiac monitoring Correct electrolytes as needed Heparin infusion  Severe intertrigo Multiple skin breakdown areas Wound care following  ID -continue IV abx as prescibed -follow up cultures  GI/Nutrition GI PROPHYLAXIS as indicated DIET-->TF's as tolerated Constipation protocol as indicated  ENDO - ICU hypoglycemic\Hyperglycemia protocol -check FSBS per protocol   ELECTROLYTES -follow labs as needed -replace as needed -pharmacy consultation   DVT/GI PRX ordered -SCDs  TRANSFUSIONS AS NEEDED MONITOR FSBS ASSESS the need for LABS as needed   Critical care provider statement:    Critical care time (minutes):  34   Critical care time was exclusive of:  Separately billable procedures and treating other patients   Critical care was necessary to treat or prevent imminent or life-threatening deterioration of the following conditions:  Acute hypoxemic respiratory failure, multiple comoribid conditions.    Critical care was time spent personally by me on the following activities:  Development of treatment plan with patient or surrogate, discussions with consultants, evaluation of patient's response to treatment, examination of patient, obtaining history from patient or surrogate, ordering and performing treatments and interventions, ordering and review of laboratory studies and re-evaluation of patient's condition.  I assumed direction of critical care for this patient from another provider in my specialty: no    This document was prepared using Dragon voice recognition software and may include unintentional dictation errors.    Ottie Glazier, M.D.  Division of [place]

## 2020-08-04 LAB — GLUCOSE, CAPILLARY
Glucose-Capillary: 157 mg/dL — ABNORMAL HIGH (ref 70–99)
Glucose-Capillary: 170 mg/dL — ABNORMAL HIGH (ref 70–99)
Glucose-Capillary: 173 mg/dL — ABNORMAL HIGH (ref 70–99)
Glucose-Capillary: 215 mg/dL — ABNORMAL HIGH (ref 70–99)
Glucose-Capillary: 230 mg/dL — ABNORMAL HIGH (ref 70–99)
Glucose-Capillary: 241 mg/dL — ABNORMAL HIGH (ref 70–99)

## 2020-08-04 LAB — BASIC METABOLIC PANEL
Anion gap: 10 (ref 5–15)
BUN: 64 mg/dL — ABNORMAL HIGH (ref 8–23)
CO2: 27 mmol/L (ref 22–32)
Calcium: 9.5 mg/dL (ref 8.9–10.3)
Chloride: 104 mmol/L (ref 98–111)
Creatinine, Ser: 1.42 mg/dL — ABNORMAL HIGH (ref 0.44–1.00)
GFR calc Af Amer: 45 mL/min — ABNORMAL LOW (ref >60)
GFR calc non Af Amer: 39 mL/min — ABNORMAL LOW (ref >60)
Glucose, Bld: 181 mg/dL — ABNORMAL HIGH (ref 70–99)
Potassium: 4.3 mmol/L (ref 3.5–5.1)
Sodium: 141 mmol/L (ref 135–145)

## 2020-08-04 LAB — PHOSPHORUS: Phosphorus: 3 mg/dL (ref 2.5–4.6)

## 2020-08-04 LAB — MAGNESIUM: Magnesium: 2 mg/dL (ref 1.7–2.4)

## 2020-08-04 MED ORDER — PREDNISONE 20 MG PO TABS
50.0000 mg | ORAL_TABLET | Freq: Every day | ORAL | Status: AC
  Administered 2020-08-05 – 2020-08-07 (×3): 50 mg
  Filled 2020-08-04 (×3): qty 1

## 2020-08-04 MED ORDER — VITAMIN D 25 MCG (1000 UNIT) PO TABS
2000.0000 [IU] | ORAL_TABLET | Freq: Every day | ORAL | Status: DC
  Administered 2020-08-05 – 2020-08-24 (×11): 2000 [IU]
  Filled 2020-08-04 (×14): qty 2

## 2020-08-04 MED ORDER — BARICITINIB 2 MG PO TABS
2.0000 mg | ORAL_TABLET | Freq: Every day | ORAL | Status: AC
  Administered 2020-08-05 – 2020-08-06 (×2): 2 mg
  Filled 2020-08-04 (×2): qty 1

## 2020-08-04 MED ORDER — ZINC SULFATE 220 (50 ZN) MG PO CAPS
220.0000 mg | ORAL_CAPSULE | Freq: Every day | ORAL | Status: DC
  Administered 2020-08-05 – 2020-08-15 (×10): 220 mg
  Filled 2020-08-04 (×10): qty 1

## 2020-08-04 MED ORDER — ADULT MULTIVITAMIN W/MINERALS CH
1.0000 | ORAL_TABLET | Freq: Every day | ORAL | Status: DC
  Administered 2020-08-05 – 2020-08-24 (×11): 1
  Filled 2020-08-04 (×14): qty 1

## 2020-08-04 MED ORDER — ASPIRIN 81 MG PO CHEW
81.0000 mg | CHEWABLE_TABLET | Freq: Every day | ORAL | Status: DC
  Administered 2020-08-05 – 2020-08-24 (×11): 81 mg
  Filled 2020-08-04 (×14): qty 1

## 2020-08-04 MED ORDER — ASCORBIC ACID 500 MG PO TABS
500.0000 mg | ORAL_TABLET | Freq: Every day | ORAL | Status: DC
  Administered 2020-08-05 – 2020-08-15 (×10): 500 mg
  Filled 2020-08-04 (×10): qty 1

## 2020-08-04 NOTE — Progress Notes (Signed)
PHARMACY CONSULT NOTE - FOLLOW UP  Pharmacy Consult for Electrolyte Monitoring and Replacement   Recent Labs: Potassium (mmol/L)  Date Value  08/04/2020 4.3   Magnesium (mg/dL)  Date Value  08/04/2020 2.0   Calcium (mg/dL)  Date Value  08/04/2020 9.5   Calcium, Total (PTH) (mg/dL)  Date Value  07/24/2020 10.1   Albumin (g/dL)  Date Value  08/02/2020 2.1 (L)  10/22/2019 4.2   Phosphorus (mg/dL)  Date Value  08/04/2020 3.0   Sodium (mmol/L)  Date Value  08/04/2020 141  10/22/2019 139   Assessment: 64 yo female with PMH for HTN and hypotension who was admitted with sepsis, acute respiratory failure, and new onset A. Fib. Patient had hypercalcemia consistent with primary hyperparathyroidism with PTH 118>102 on this admission. Pharmacy consulted for electrolyte monitoring and replacement.   9/28 electrolytes WNL. Scr increased from 2.06>>2.27. Digoxin discontinued s/t elevated level in setting or worsening renal function. Pt requiring pressure support with vasopressin and not requiring any maintenance fluids at this time. Tube feeds started. 9/29 Potassium slightly elevated, phos and mag WNL. No replacement indicated at this time 9/30 Electrolytes WNL, phos trending down but okay for now. Scr trending down with good UOP. Pt still intubated and requiring pressure support. On tube feeds and no maintenance fluids.    Goal of Therapy:  Electrolytes WNL K> 4 Mg >2   Plan:  Patient has not required replacement for several days. Will discontinue consult.  Tawnya Crook, PharmD 08/04/2020 11:41 AM

## 2020-08-04 NOTE — Progress Notes (Signed)
Patient proned at [place], ET tube secured at 23cm at center with cloth tape. PRVC 430, 30RR, 10 PEEP, 50% FIO2. Patient proned with several RNs present at bedside with no complications.

## 2020-08-04 NOTE — Progress Notes (Signed)
CRITICAL CARE PROGRESS NOTE    Name: Courtney Anderson MRN: [id number] DOB: May 07, 1956     LOS: 59   SUBJECTIVE FINDINGS & SIGNIFICANT EVENTS    Patient description:  Reason for Consult: Severe respiratory distress and patient with COVID-19 pneumonia. Referring Physician: Florina Ou, MD  Courtney Anderson is an 64 y.o. female.  HPI: Patient is a 64 year old female with history noted below, who was admitted to [facility] on 2020-07-24 brought in via EMS as a "code sepsis".  Patient subsequently tested positive for COVID-19.  She presented with oxygen saturations of 69% on room air and had to be placed on nonrebreather mask to achieve oxygen saturations in the 90s.  She was subsequently admitted to the Covid unit for management of COVID-19 pneumonia.  She was noted to have acute kidney injury she was treated with remdesivir, zinc, vitamin C and antitussives.  She was placed empirically on Rocephin and azithromycin.  She continued to have increased FiO2 requirements.  She was refusing to self prone.  She had atrial fibrillation and was seen by cardiology to assist in management.   07/31/20- patient is on 90% FiO2, will modify meds as patient is on metoprolol and levophed as well as cardizem.  Will d/c propofol to improve SBP and remove arythmogenic vasopressor if able.  Plan to have CVP trending and diuresis today.   Goals of care discussion today - patient made DNR per sister and son of patient. I had 2 separate phone meetings with son and separate conversation with sister today to explain patient is worse with shock and renal failure requiring multiple pressors.   08/01/20- patient remains critically ill. FiO2 has been weaned down to 80% today.  08/03/20- patient remains critically ill shes being proned, weaning FiO2 as able.    08/04/20- patient with no interval changes continue with weaning and proning protocol   Lines/tubes : Airway 7.5 mm (Active)  Secured at (cm) 23 cm 07/31/20 0806  Measured From Lips 07/31/20 [place] 07/31/20 0806  Secured By [employer] 07/31/20 0806  Tube Holder Repositioned Yes 07/31/20 0806  Cuff Pressure (cm H2O) 26 cm H2O 07/31/20 0806  Site Condition Dry 07/31/20 0806     CVC Triple Lumen 07/30/20 Left Internal jugular (Active)  Indication for Insertion or Continuance of Line Vasoactive infusions 07/30/20 2010  Site Assessment Clean;Dry;Intact 07/30/20 2010  Proximal Lumen Status Infusing 07/30/20 2010  Medial Lumen Status Infusing 07/30/20 2010  Distal Lumen Status Infusing 07/30/20 2010  Dressing Type Transparent;Occlusive 07/30/20 2010  Dressing Status Clean;Dry;Intact 07/30/20 2010  Line Care Connections checked and tightened 07/30/20 2010  Dressing Change Due 08/06/20 07/30/20 2010     NG/OG Tube O[facility] Center mouth Xray Documented cm marking at nare/ corner of mouth 75 cm (Active)  Cm Marking at Nare/Corner of Mouth (if applicable) 75 cm 83/15/17 2010  Site Assessment Clean;Dry;Intact 07/30/20 2010  Ongoing Placement Verification No change in cm markings or external length of tube from initial placement;No acute changes, not attributed to clinical condition;No change in respiratory status;Xray 07/30/20 2010  Status Suction-low intermittent 07/30/20 2010  Amount of suction 80 mmHg 07/30/20 2010  Drainage Appearance [employer] Shark;Coffee ground 07/30/20 2010     Urethral Catheter Olen Pel Double-lumen;Latex 16 Fr. (Active)  Indication for Insertion or Continuance of Catheter Unstable critically ill patients first 24-48 hours (See Criteria) 07/31/20 0752  Site Assessment Clean;Intact 07/30/20 2000  Catheter Maintenance Bag below level of bladder;Catheter secured;Drainage bag/tubing not touching floor;Insertion  date on drainage bag;No  dependent loops;Seal intact 07/30/20 2000  Collection Container Standard drainage bag 07/30/20 2000  Securement Method Securing device (Describe) 07/30/20 2000  Urinary Catheter Interventions (if applicable) Unclamped 73/53/29 2000  Output (mL) 75 mL 07/31/20 0600    Microbiology/Sepsis markers: Results for orders placed or performed during the hospital encounter of 07/24/20  Urine culture     Status: Abnormal   Collection Time: 07/24/20  9:12 AM   Specimen: Urine, Random  Result Value Ref Range Status   Specimen Description   Final    URINE, RANDOM Performed at [facility], [address] [place], [place] [address]    Special Requests   Final    NONE Performed at [facility], [address] [place], [place] [address]    Culture (A)  Final    <10,000 COLONIES/mL >=100,000 COLONIES/mL Performed at [facility], [place] [address], [place], [place] [address]    Report Status 07/25/2020 FINAL  Final  Blood culture (routine single)     Status: None   Collection Time: 07/24/20  9:14 AM   Specimen: BLOOD LEFT ARM  Result Value Ref Range Status   Specimen Description BLOOD LEFT ARM  Final   Special Requests   Final    BOTTLES DRAWN AEROBIC AND ANAEROBIC Blood Culture adequate volume   Culture   Final    NO GROWTH 5 DAYS Performed at [facility], [address] [place], [place] [address]    Report Status 07/29/2020 FINAL  Final  SARS Coronavirus 2 by RT PCR (hospital order, performed in [place] hospital lab) Nasopharyngeal Nasopharyngeal Swab     Status: Abnormal   Collection Time: 07/24/20  9:16 AM   Specimen: Nasopharyngeal Swab  Result Value Ref Range Status   SARS Coronavirus 2 POSITIVE (A) NEGATIVE Final    Comment: RESULT CALLED TO, READ BACK BY AND VERIFIED WITH:  SAMANTHA HAMILTON AT 1194 07/24/20 SDR (NOTE) SARS-CoV-2 target nucleic acids are DETECTED  SARS-CoV-2 RNA is generally detectable in upper respiratory specimens    during the acute phase of infection.  Positive results are indicative  of the presence of the identified virus, but do not rule out bacterial infection or co-infection with other pathogens not detected by the test.  Clinical correlation with patient history and  other diagnostic information is necessary to determine patient infection status.  The expected result is negative.  Fact Sheet for Patients:   StrictlyIdeas.no   Fact Sheet for Healthcare Providers:   BankingDealers.co.za    This test is not yet approved or cleared by the [place] FDA and  has been authorized for detection and/or diagnosis of SARS-CoV-2 by FDA under an Emergency Use Authorization (EUA).  This EUA will remain in effect (meaning thi s test can be used) for the duration of  the COVID-19 declaration under Section 564(b)(1) of the Act, 21 U.S.C. section 360-bbb-3(b)(1), unless the authorization is terminated or revoked sooner.  Performed at [facility], [address] [place], [place] [address]   MRSA PCR Screening     Status: None   Collection Time: 07/30/20 11:08 AM   Specimen: Nasopharyngeal  Result Value Ref Range Status   MRSA by PCR NEGATIVE NEGATIVE Final    Comment:        The GeneXpert MRSA Assay (FDA approved for NASAL specimens only), is one component of a comprehensive MRSA colonization surveillance program. It is not intended to diagnose MRSA infection nor to guide  or monitor treatment for MRSA infections. Performed at [facility], [address] [place], [place] [address]   Culture, respiratory (non-expectorated)     Status: None   Collection Time: 08/01/20 10:46 AM   Specimen: Tracheal Aspirate; Respiratory  Result Value Ref Range Status   Specimen Description   Final    TRACHEAL ASPIRATE Performed at [facility], [address], [place], [place] [address]    Special Requests   Final     NONE Performed at [facility], [place], [place] [address]    Gram Stain NO WBC SEEN NO ORGANISMS SEEN   Final   Culture   Final    RARE Normal respiratory flora-no Staph aureus or Pseudomonas seen Performed at [facility] [address] [place], [place] [address]    Report Status 08/03/2020 FINAL  Final    Anti-infectives:  Anti-infectives (From admission, onward)   Start     Dose/Rate Route Frequency Ordered Stop   08/01/20 1200  ceFEPIme (MAXIPIME) 2 g in sodium chloride 0.9 % 100 mL IVPB  Status:  Discontinued        2 g 200 mL/hr over 30 Minutes Intravenous Every 12 hours 08/01/20 1026 08/03/20 1014   07/25/20 1000  azithromycin (ZITHROMAX) 500 mg in sodium chloride 0.9 % 250 mL IVPB  Status:  Discontinued        500 mg 250 mL/hr over 60 Minutes Intravenous Every 24 hours 07/24/20 1108 07/24/20 1317   07/25/20 1000  remdesivir 100 mg in sodium chloride 0.9 % 100 mL IVPB       "Followed by" Linked Group Details   100 mg 200 mL/hr over 30 Minutes Intravenous Daily 07/24/20 1112 07/28/20 1544   07/24/20 1300  fluconazole (DIFLUCAN) tablet 100 mg  Status:  Discontinued        100 mg Oral Daily 07/24/20 1251 07/25/20 1522   07/24/20 1200  remdesivir 200 mg in sodium chloride 0.9% 250 mL IVPB       "Followed by" Linked Group Details   200 mg 580 mL/hr over 30 Minutes Intravenous Once 07/24/20 1112 07/24/20 1424   07/24/20 1115  cefTRIAXone (ROCEPHIN) 2 g in sodium chloride 0.9 % 100 mL IVPB  Status:  Discontinued        2 g 200 mL/hr over 30 Minutes Intravenous Every 24 hours 07/24/20 1108 07/24/20 1113   07/24/20 1000  cefTRIAXone (ROCEPHIN) 2 g in sodium chloride 0.9 % 100 mL IVPB  Status:  Discontinued        2 g 200 mL/hr over 30 Minutes Intravenous Every 24 hours 07/24/20 0947 07/28/20 1438   07/24/20 1000  azithromycin (ZITHROMAX) 500 mg in sodium chloride 0.9 % 250 mL IVPB  Status:  Discontinued        500 mg 250 mL/hr over 60 Minutes  Intravenous Every 24 hours 07/24/20 0947 07/28/20 1438       Consults: Treatment Team:  Yolonda Kida, MD Dionisio David, MD     PAST MEDICAL HISTORY   Past Medical History:  Diagnosis Date  . Allergy   . Elevated transaminase level   . Hyperlipidemia   . Hypertension   . Obesity      SURGICAL HISTORY   Past Surgical History:  Procedure Laterality Date  . BREAST SURGERY    . ECTOPIC PREGNANCY SURGERY       FAMILY HISTORY   Family History  Problem Relation Age of Onset  .  Hypertension Father   . Stroke Father   . Hypertension Mother   . Arthritis Mother   . Aneurysm Sister   . Hypertension Sister   . Hypertension Brother   . Hypertension Sister   . Hypertension Brother      SOCIAL HISTORY   Social History   Tobacco Use  . Smoking status: Former Research scientist (life sciences)  . Smokeless tobacco: Never Used  Substance Use Topics  . Alcohol use: Yes    Alcohol/week: 0.0 standard drinks    Comment: on occasion  . Drug use: No     MEDICATIONS   Current Medication:  Current Facility-Administered Medications:  .  acetaminophen (TYLENOL) tablet 650 mg, 650 mg, Oral, Q6H PRN, 650 mg at 08/01/20 2221 **OR** acetaminophen (TYLENOL) suppository 650 mg, 650 mg, Rectal, Q6H PRN, Agbata, Tochukwu, MD .  amiodarone (PACERONE) tablet 200 mg, 200 mg, Per Tube, BID, Lanney Gins, Leeman Johnsey, MD, 200 mg at 08/04/20 0827 .  apixaban (ELIQUIS) tablet 5 mg, 5 mg, Per Tube, BID, Lanney Gins, Ayiden Milliman, MD, 5 mg at 08/04/20 0828 .  ascorbic acid (VITAMIN C) tablet 500 mg, 500 mg, Oral, Daily, Agbata, Tochukwu, MD, 500 mg at 08/04/20 0828 .  aspirin EC tablet 81 mg, 81 mg, Oral, Daily, Agbata, Tochukwu, MD, 81 mg at 08/04/20 0827 .  baricitinib (OLUMIANT) tablet 4 mg, 4 mg, Oral, Daily, Benita Gutter, RPH, 4 mg at 08/04/20 1751 .  chlorhexidine gluconate (MEDLINE KIT) (PERIDEX) 0.12 % solution 15 mL, 15 mL, Mouth Rinse, BID, Akingbade, Jimi O, MD, 15 mL at 08/04/20 0832 .  Chlorhexidine Gluconate  Cloth 2 % PADS 6 each, 6 each, Topical, Daily, Tyler Pita, MD, 6 each at 08/04/20 [phone] .  cholecalciferol (VITAMIN D3) tablet 2,000 Units, 2,000 Units, Oral, Daily, Agbata, Tochukwu, MD, 2,000 Units at 08/04/20 0828 .  dexmedetomidine (PRECEDEX) 400 MCG/100ML (4 mcg/mL) infusion, 0.4-1.2 mcg/kg/hr, Intravenous, Titrated, Aquarius Latouche, MD, Last Rate: 27.2 mL/hr at 08/04/20 0936, 1 mcg/kg/hr at 08/04/20 0936 .  docusate (COLACE) 50 MG/5ML liquid 50 mg, 50 mg, Per Tube, BID, Lanney Gins, Cally Nygard, MD, 50 mg at 08/04/20 0827 .  feeding supplement (PROSource TF) liquid 45 mL, 45 mL, Per Tube, TID, Yuri Flener, MD, 45 mL at 08/04/20 0827 .  feeding supplement (VITAL HIGH PROTEIN) liquid 1,000 mL, 1,000 mL, Per Tube, Continuous, Freya Zobrist, MD, Last Rate: 50 mL/hr at 08/04/20 0401, 1,000 mL at 08/04/20 0401 .  fentaNYL 2574mg in NS 254m(1070mml) infusion-PREMIX, 0-400 mcg/hr, Intravenous, Continuous, Hall, Scott A, RPH, Last Rate: 20 mL/hr at 08/04/20 0400, 200 mcg/hr at 08/04/20 0400 .  Gerhardt's butt cream, , Topical, TID, Agbata, Tochukwu, MD, Given at 08/04/20 [id number] guaiFENesin-dextromethorphan (ROBITUSSIN DM) 100-10 MG/5ML syrup 10 mL, 10 mL, Oral, Q4H PRN, Agbata, Tochukwu, MD .  haloperidol lactate (HALDOL) injection 5 mg, 5 mg, Intravenous, Q6H PRN, PatPara SkeansD .  HYDROcodone-acetaminophen (NORCO/VICODIN) 5-325 MG per tablet 1 tablet, 1 tablet, Oral, Q6H PRN, OumLang SnowP, 1 tablet at 07/25/20 0143 .  insulin aspart (novoLOG) injection 0-9 Units, 0-9 Units, Subcutaneous, Q4H, BlaAwilda BillP, 2 Units at 08/04/20 [phone] MEDLINE mouth rinse, 15 mL, Mouth Rinse, 10 times per day, AkiCheryll DessertD, 15 mL at 08/04/20 0833 .  metoprolol tartrate (LOPRESSOR) injection 5 mg, 5 mg, Intravenous, Q2H PRN, Callwood, Dwayne D, MD, 5 mg at 07/30/20 0920 .  midazolam (VERSED) 50 mg/50 mL (1 mg/mL) premix infusion, 0.5-10 mg/hr, Intravenous, Continuous, GonTyler Pita  MD .  multivitamin with minerals tablet 1 tablet, 1 tablet, Oral, Daily, Agbata, Tochukwu, MD, 1 tablet at 08/04/20 0827 .  norepinephrine (LEVOPHED) 16 mg in 238m premix infusion, 0-40 mcg/min, Intravenous, Titrated, AOttie Glazier MD, Stopped at 07/31/20 1806 .  nystatin (MYCOSTATIN/NYSTOP) topical powder, , Topical, TID, PDarliss Cheney MD, Given at 08/04/20 [phone].  ondansetron (ZOFRAN) tablet 4 mg, 4 mg, Oral, Q6H PRN **OR** ondansetron (ZOFRAN) injection 4 mg, 4 mg, Intravenous, Q6H PRN, Agbata, Tochukwu, MD .  pneumococcal 23 valent vaccine (PNEUMOVAX-23) injection 0.5 mL, 0.5 mL, Intramuscular, Tomorrow-1000, Pahwani, Ravi, MD .  [COMPLETED] methylPREDNISolone sodium succinate (SOLU-MEDROL) 40 mg/mL injection 39.6 mg, 0.5 mg/kg, Intravenous, Q12H, 39.6 mg at 07/27/20 0542 **FOLLOWED BY** predniSONE (DELTASONE) tablet 50 mg, 50 mg, Oral, Daily, ALanney Gins Azure Budnick, MD, 50 mg at 08/04/20 05188.  propofol (DIPRIVAN) 1000 MG/100ML infusion, 5-80 mcg/kg/min, Intravenous, Titrated, GTyler Pita MD, Last Rate: 9.5 mL/hr at 08/04/20 0612, 20 mcg/kg/min at 08/04/20 0612 .  vasopressin (PITRESSIN) 20 Units in sodium chloride 0.9 % 100 mL infusion-*FOR SHOCK*, 0-0.03 Units/min, Intravenous, Continuous, AOttie Glazier MD, Stopped at 08/04/20 0147 .  vecuronium (NORCURON) injection 10 mg, 10 mg, Intravenous, Q1H PRN, KDarel HongD, NP, 10 mg at 08/04/20 0636 .  zinc sulfate capsule 220 mg, 220 mg, Oral, Daily, Agbata, Tochukwu, MD, 220 mg at 08/04/20 0827    ALLERGIES   Codeine    REVIEW OF SYSTEMS    Unable to perform due to Sedation on MV  PHYSICAL EXAMINATION   Vital Signs: Temp:  [93.7 F (34.3 C)-99.5 F (37.5 C)] 99.1 F (37.3 C) (10/01 0600) Pulse Rate:  [87-105] 95 (10/01 0600) Resp:  [22-31] 26 (10/01 0600) BP: (90-142)/(59-91) 117/76 (10/01 0600) SpO2:  [91 %-96 %] 96 % (10/01 0600) FiO2 (%):  [45 %-50 %] 45 % (10/01 0743) Weight:  [108.3 kg] 108.3 kg  (10/01 0500)  GENERAL:intubated on MV age appropriate HEAD: Normocephalic, atraumatic.  EYES: Pupils equal, round, reactive to light.  No scleral icterus.  MOUTH: Moist mucosal membrane. NECK: Supple. No thyromegaly. No nodules. No JVD.  PULMONARY: crackles at bases difficult to appreciate due to MV CARDIOVASCULAR: S1 and S2. Regular rate and rhythm. No murmurs, rubs, or gallops.  GASTROINTESTINAL: Soft, nontender, non-distended. No masses. Positive bowel sounds. No hepatosplenomegaly.  MUSCULOSKELETAL: No swelling, clubbing, or edema.  NEUROLOGIC: Mild distress due to acute illness SKIN:intact,warm,dry   PERTINENT DATA     Infusions: . dexmedetomidine (PRECEDEX) IV infusion 1 mcg/kg/hr (08/04/20 0936)  . feeding supplement (VITAL HIGH PROTEIN) 1,000 mL (08/04/20 0401)  . fentaNYL infusion INTRAVENOUS 200 mcg/hr (08/04/20 0400)  . midazolam    . norepinephrine (LEVOPHED) Adult infusion Stopped (07/31/20 1806)  . propofol (DIPRIVAN) infusion 20 mcg/kg/min (08/04/20 0612)  . vasopressin Stopped (08/04/20 0147)   Scheduled Medications: . amiodarone  200 mg Per Tube BID  . apixaban  5 mg Per Tube BID  . vitamin C  500 mg Oral Daily  . aspirin EC  81 mg Oral Daily  . baricitinib  4 mg Oral Daily  . chlorhexidine gluconate (MEDLINE KIT)  15 mL Mouth Rinse BID  . Chlorhexidine Gluconate Cloth  6 each Topical Daily  . cholecalciferol  2,000 Units Oral Daily  . docusate  50 mg Per Tube BID  . feeding supplement (PROSource TF)  45 mL Per Tube TID  . Gerhardt's butt cream   Topical TID  . insulin aspart  0-9 Units Subcutaneous Q4H  . mouth rinse  15 mL Mouth Rinse 10 times per day  . multivitamin with minerals  1 tablet Oral Daily  . nystatin   Topical TID  . pneumococcal 23 valent vaccine  0.5 mL Intramuscular Tomorrow-1000  . predniSONE  50 mg Oral Daily  . zinc sulfate  220 mg Oral Daily   PRN Medications: acetaminophen **OR** acetaminophen, guaiFENesin-dextromethorphan,  haloperidol lactate, HYDROcodone-acetaminophen, metoprolol tartrate, ondansetron **OR** ondansetron (ZOFRAN) IV, vecuronium Hemodynamic parameters:   Intake/Output: 09/30 0701 - 10/01 0700 In: 1949.5 [I.V.:1349.5; NG/GT:600] Out: 3050 [Urine:3050]  Ventilator  Settings: Vent Mode: PRVC FiO2 (%):  [45 %-50 %] 45 % Set Rate:  [30 bmp] 30 bmp Vt Set:  [430 mL] 430 mL PEEP:  [8 cmH20-10 cmH20] 8 cmH20 Plateau Pressure:  [18 cmH20-22 cmH20] 21 cmH20     LAB RESULTS:  Basic Metabolic Panel: Recent Labs  Lab 07/29/20 0620 07/30/20 0448 07/31/20 0653 07/31/20 0653 08/01/20 0500 08/01/20 0500 08/02/20 0430 08/02/20 0430 08/03/20 0430 08/04/20 0400  NA 140   < > 138  --  136  --  136  --  139 141  K 3.7   < > 4.7   < > 5.1   < > 5.3*   < > 4.8 4.3  CL 104   < > 99  --  101  --  102  --  104 104  CO2 26   < > 28  --  25  --  26  --  26 27  GLUCOSE 132*   < > 145*  --  160*  --  165*  --  182* 181*  BUN 16   < > 32*  --  43*  --  50*  --  56* 64*  CREATININE 0.88   < > 2.06*  --  2.27*  --  2.07*  --  1.65* 1.42*  CALCIUM 9.8   < > 10.1  --  9.4  --  9.5  --  9.5 9.5  MG 1.7   < > 2.4  --  2.1  --  2.1  --  2.1 2.0  PHOS 2.5  --   --   --  5.0*  --  4.4  --  3.6 3.0   < > = values in this interval not displayed.   Liver Function Tests: Recent Labs  Lab 07/29/20 0620 08/02/20 0430  AST 34  --   ALT 27  --   ALKPHOS 73  --   BILITOT 0.8  --   PROT 7.0  --   ALBUMIN 2.6* 2.1*   No results for input(s): LIPASE, AMYLASE in the last 168 hours. No results for input(s): AMMONIA in the last 168 hours. CBC: Recent Labs  Lab 07/29/20 0620 07/30/20 0449 08/02/20 0430  WBC 20.7* 19.0* 14.0*  NEUTROABS 18.4*  --  12.7*  HGB 15.4* 15.4* 12.2  HCT 46.6* 45.4 37.9  MCV 85.2 83.6 86.3  PLT 429* 387 107*   Cardiac Enzymes: No results for input(s): CKTOTAL, CKMB, CKMBINDEX, TROPONINI in the last 168 hours. BNP: Invalid input(s): POCBNP CBG: Recent Labs  Lab  08/03/20 1605 08/03/20 1903 08/03/20 2332 08/04/20 0357 08/04/20 0743  GLUCAP 195* 188* 160* 170* 157*       IMAGING RESULTS:  Imaging: No results found.  ASSESSMENT AND PLAN    -Multidisciplinary rounds held today Acute respiratory failure with hypoxia due to COVID-19 ARDS COVID-19 pneumonia Proceed with intubation and mechanical ventilation ARDS protocol, 6 cc/kg of PBW Recruitment maneuvers as  necessary Prone positioning if no improvement Completed remdesivir Continue Rocephin and azithromycin for now  Atrial fibrillation with RVR Due to physiologic stress/hypoxia Hx: Hyperthyroidism treated with RAI Continue amiodarone and calcium channel blocker Cardiology following Continue cardiac monitoring Correct electrolytes as needed Heparin infusion  Severe intertrigo Multiple skin breakdown areas Wound care following  ID -continue IV abx as prescibed -follow up cultures  GI/Nutrition GI PROPHYLAXIS as indicated DIET-->TF's as tolerated Constipation protocol as indicated  ENDO - ICU hypoglycemic\Hyperglycemia protocol -check FSBS per protocol   ELECTROLYTES -follow labs as needed -replace as needed -pharmacy consultation   DVT/GI PRX ordered -SCDs  TRANSFUSIONS AS NEEDED MONITOR FSBS ASSESS the need for LABS as needed   Critical care provider statement:    Critical care time (minutes):  33   Critical care time was exclusive of:  Separately billable procedures and treating other patients   Critical care was necessary to treat or prevent imminent or life-threatening deterioration of the following conditions:  Acute hypoxemic respiratory failure, multiple comoribid conditions.    Critical care was time spent personally by me on the following activities:  Development of treatment plan with patient or surrogate, discussions with consultants, evaluation of patient's response to treatment, examination of patient, obtaining history from patient or  surrogate, ordering and performing treatments and interventions, ordering and review of laboratory studies and re-evaluation of patient's condition.  I assumed direction of critical care for this patient from another provider in my specialty: no    This document was prepared using Dragon voice recognition software and may include unintentional dictation errors.    Ottie Glazier, M.D.  Division of [facility]

## 2020-08-05 LAB — GLUCOSE, CAPILLARY
Glucose-Capillary: 148 mg/dL — ABNORMAL HIGH (ref 70–99)
Glucose-Capillary: 185 mg/dL — ABNORMAL HIGH (ref 70–99)
Glucose-Capillary: 198 mg/dL — ABNORMAL HIGH (ref 70–99)
Glucose-Capillary: 202 mg/dL — ABNORMAL HIGH (ref 70–99)
Glucose-Capillary: 246 mg/dL — ABNORMAL HIGH (ref 70–99)
Glucose-Capillary: 251 mg/dL — ABNORMAL HIGH (ref 70–99)

## 2020-08-05 LAB — TRIGLYCERIDES: Triglycerides: 79 mg/dL (ref <150)

## 2020-08-05 LAB — MAGNESIUM: Magnesium: 2 mg/dL (ref 1.7–2.4)

## 2020-08-05 MED ORDER — MIDAZOLAM HCL 50 MG/10ML IJ SOLN
0.5000 mg/h | INTRAVENOUS | Status: DC
  Administered 2020-08-05: 4 mg/h via INTRAVENOUS
  Administered 2020-08-06 (×2): 6 mg/h via INTRAVENOUS
  Administered 2020-08-07: 2 mg/h via INTRAVENOUS
  Administered 2020-08-07 (×2): 4 mg/h via INTRAVENOUS
  Filled 2020-08-05 (×6): qty 10

## 2020-08-05 NOTE — Progress Notes (Signed)
CRITICAL CARE PROGRESS NOTE    Name: Courtney Anderson MRN: [id number] DOB: Mar 12, 1956     LOS: 64   SUBJECTIVE FINDINGS & SIGNIFICANT EVENTS    Patient description:  Reason for Consult: Severe respiratory distress and patient with COVID-19 pneumonia. Referring Physician: Florina Ou, MD  Courtney Anderson is an 64 y.o. female.  HPI: Patient is a 64 year old female with history noted below, who was admitted to [facility] on 2020-07-24 brought in via EMS as a "code sepsis".  Patient subsequently tested positive for COVID-19.  She presented with oxygen saturations of 69% on room air and had to be placed on nonrebreather mask to achieve oxygen saturations in the 90s.  She was subsequently admitted to the Covid unit for management of COVID-19 pneumonia.  She was noted to have acute kidney injury she was treated with remdesivir, zinc, vitamin C and antitussives.  She was placed empirically on Rocephin and azithromycin.  She continued to have increased FiO2 requirements.  She was refusing to self prone.  She had atrial fibrillation and was seen by cardiology to assist in management.   07/31/20- patient is on 90% FiO2, will modify meds as patient is on metoprolol and levophed as well as cardizem.  Will d/c propofol to improve SBP and remove arythmogenic vasopressor if able.  Plan to have CVP trending and diuresis today.   Goals of care discussion today - patient made DNR per sister and son of patient. I had 2 separate phone meetings with son and separate conversation with sister today to explain patient is worse with shock and renal failure requiring multiple pressors.   08/01/20- patient remains critically ill. FiO2 has been weaned down to 80% today.  08/03/20- patient remains critically ill shes being proned, weaning FiO2 as able.    08/04/20- patient with no interval changes continue with weaning and proning protocol 08/05/20- patient is on 40%FiO2 this am.    Lines/tubes : Airway 7.5 mm (Active)  Secured at (cm) 23 cm 07/31/20 0806  Measured From Lips 07/31/20 [place] 07/31/20 0806  Secured By [employer] 07/31/20 0806  Tube Holder Repositioned Yes 07/31/20 0806  Cuff Pressure (cm H2O) 26 cm H2O 07/31/20 0806  Site Condition Dry 07/31/20 0806     CVC Triple Lumen 07/30/20 Left Internal jugular (Active)  Indication for Insertion or Continuance of Line Vasoactive infusions 07/30/20 2010  Site Assessment Clean;Dry;Intact 07/30/20 2010  Proximal Lumen Status Infusing 07/30/20 2010  Medial Lumen Status Infusing 07/30/20 2010  Distal Lumen Status Infusing 07/30/20 2010  Dressing Type Transparent;Occlusive 07/30/20 2010  Dressing Status Clean;Dry;Intact 07/30/20 2010  Line Care Connections checked and tightened 07/30/20 2010  Dressing Change Due 08/06/20 07/30/20 2010     NG/OG Tube [facility] mouth Xray Documented cm marking at nare/ corner of mouth 75 cm (Active)  Cm Marking at Nare/Corner of Mouth (if applicable) 75 cm 01/00/71 2010  Site Assessment Clean;Dry;Intact 07/30/20 2010  Ongoing Placement Verification No change in cm markings or external length of tube from initial placement;No acute changes, not attributed to clinical condition;No change in respiratory status;Xray 07/30/20 2010  Status Suction-low intermittent 07/30/20 2010  Amount of suction 80 mmHg 07/30/20 2010  Drainage Appearance [employer] Shark;Coffee ground 07/30/20 2010     Urethral Catheter Olen Pel Double-lumen;Latex 16 Fr. (Active)  Indication for Insertion or Continuance of Catheter Unstable critically ill patients first 24-48 hours (See Criteria) 07/31/20 0752  Site Assessment Clean;Intact 07/30/20 2000  Catheter Maintenance Bag below  level of bladder;Catheter secured;Drainage bag/tubing not touching  floor;Insertion date on drainage bag;No dependent loops;Seal intact 07/30/20 2000  Collection Container Standard drainage bag 07/30/20 2000  Securement Method Securing device (Describe) 07/30/20 2000  Urinary Catheter Interventions (if applicable) Unclamped 35/32/99 2000  Output (mL) 75 mL 07/31/20 0600    Microbiology/Sepsis markers: Results for orders placed or performed during the hospital encounter of 07/24/20  Urine culture     Status: Abnormal   Collection Time: 07/24/20  9:12 AM   Specimen: Urine, Random  Result Value Ref Range Status   Specimen Description   Final    URINE, RANDOM Performed at [facility], [address] [place], [place] [address]    Special Requests   Final    NONE Performed at [facility], [address] [place], [place] [address]    Culture (A)  Final    <10,000 COLONIES/mL >=100,000 COLONIES/mL Performed at [facility], [place] [address] [place], [place] [address]    Report Status 07/25/2020 FINAL  Final  Blood culture (routine single)     Status: None   Collection Time: 07/24/20  9:14 AM   Specimen: BLOOD LEFT ARM  Result Value Ref Range Status   Specimen Description BLOOD LEFT ARM  Final   Special Requests   Final    BOTTLES DRAWN AEROBIC AND ANAEROBIC Blood Culture adequate volume   Culture   Final    NO GROWTH 5 DAYS Performed at [facility], [address] [place], [place] [address]    Report Status 07/29/2020 FINAL  Final  SARS Coronavirus 2 by RT PCR (hospital order, performed in [facility] hospital lab) Nasopharyngeal Nasopharyngeal Swab     Status: Abnormal   Collection Time: 07/24/20  9:16 AM   Specimen: Nasopharyngeal Swab  Result Value Ref Range Status   SARS Coronavirus 2 POSITIVE (A) NEGATIVE Final    Comment: RESULT CALLED TO, READ BACK BY AND VERIFIED WITH:  SAMANTHA HAMILTON AT 1941 07/24/20 SDR (NOTE) SARS-CoV-2 target nucleic acids are DETECTED  SARS-CoV-2 RNA is generally  detectable in upper respiratory specimens  during the acute phase of infection.  Positive results are indicative  of the presence of the identified virus, but do not rule out bacterial infection or co-infection with other pathogens not detected by the test.  Clinical correlation with patient history and  other diagnostic information is necessary to determine patient infection status.  The expected result is negative.  Fact Sheet for Patients:   StrictlyIdeas.no   Fact Sheet for Healthcare Providers:   BankingDealers.co.za    This test is not yet approved or cleared by the [place] FDA and  has been authorized for detection and/or diagnosis of SARS-CoV-2 by FDA under an Emergency Use Authorization (EUA).  This EUA will remain in effect (meaning thi s test can be used) for the duration of  the COVID-19 declaration under Section 564(b)(1) of the Act, 21 U.S.C. section 360-bbb-3(b)(1), unless the authorization is terminated or revoked sooner.  Performed at [facility], [address] [place], [place] [address]   MRSA PCR Screening     Status: None   Collection Time: 07/30/20 11:08 AM   Specimen: Nasopharyngeal  Result Value Ref Range Status   MRSA by PCR NEGATIVE NEGATIVE Final    Comment:        The GeneXpert MRSA Assay (FDA approved for NASAL specimens only), is one component of a comprehensive MRSA colonization surveillance program. It is not intended  to diagnose MRSA infection nor to guide or monitor treatment for MRSA infections. Performed at [facility], [address] [place], [place] [address]   Culture, respiratory (non-expectorated)     Status: None   Collection Time: 08/01/20 10:46 AM   Specimen: Tracheal Aspirate; Respiratory  Result Value Ref Range Status   Specimen Description   Final    TRACHEAL ASPIRATE Performed at [facility], [address], [place], [place] [address]     Special Requests   Final    NONE Performed at [facility], [place], [place] [address]    Gram Stain NO WBC SEEN NO ORGANISMS SEEN   Final   Culture   Final    RARE Normal respiratory flora-no Staph aureus or Pseudomonas seen Performed at [facility] [address] [place],  [address]    Report Status 08/03/2020 FINAL  Final    Anti-infectives:  Anti-infectives (From admission, onward)   Start     Dose/Rate Route Frequency Ordered Stop   08/01/20 1200  ceFEPIme (MAXIPIME) 2 g in sodium chloride 0.9 % 100 mL IVPB  Status:  Discontinued        2 g 200 mL/hr over 30 Minutes Intravenous Every 12 hours 08/01/20 1026 08/03/20 1014   07/25/20 1000  azithromycin (ZITHROMAX) 500 mg in sodium chloride 0.9 % 250 mL IVPB  Status:  Discontinued        500 mg 250 mL/hr over 60 Minutes Intravenous Every 24 hours 07/24/20 1108 07/24/20 1317   07/25/20 1000  remdesivir 100 mg in sodium chloride 0.9 % 100 mL IVPB       "Followed by" Linked Group Details   100 mg 200 mL/hr over 30 Minutes Intravenous Daily 07/24/20 1112 07/28/20 1544   07/24/20 1300  fluconazole (DIFLUCAN) tablet 100 mg  Status:  Discontinued        100 mg Oral Daily 07/24/20 1251 07/25/20 1522   07/24/20 1200  remdesivir 200 mg in sodium chloride 0.9% 250 mL IVPB       "Followed by" Linked Group Details   200 mg 580 mL/hr over 30 Minutes Intravenous Once 07/24/20 1112 07/24/20 1424   07/24/20 1115  cefTRIAXone (ROCEPHIN) 2 g in sodium chloride 0.9 % 100 mL IVPB  Status:  Discontinued        2 g 200 mL/hr over 30 Minutes Intravenous Every 24 hours 07/24/20 1108 07/24/20 1113   07/24/20 1000  cefTRIAXone (ROCEPHIN) 2 g in sodium chloride 0.9 % 100 mL IVPB  Status:  Discontinued        2 g 200 mL/hr over 30 Minutes Intravenous Every 24 hours 07/24/20 0947 07/28/20 1438   07/24/20 1000  azithromycin (ZITHROMAX) 500 mg in sodium chloride 0.9 % 250 mL IVPB  Status:  Discontinued        500  mg 250 mL/hr over 60 Minutes Intravenous Every 24 hours 07/24/20 0947 07/28/20 1438       Consults: Treatment Team:  Yolonda Kida, MD Dionisio David, MD     PAST MEDICAL HISTORY   Past Medical History:  Diagnosis Date  . Allergy   . Elevated transaminase level   . Hyperlipidemia   . Hypertension   . Obesity      SURGICAL HISTORY   Past Surgical History:  Procedure Laterality Date  . BREAST SURGERY    . ECTOPIC PREGNANCY SURGERY       FAMILY HISTORY   Family History  Problem Relation Age of Onset  . Hypertension Father   . Stroke Father   . Hypertension Mother   . Arthritis Mother   . Aneurysm Sister   . Hypertension Sister   . Hypertension Brother   . Hypertension Sister   . Hypertension Brother      SOCIAL HISTORY   Social History   Tobacco Use  . Smoking status: Former Research scientist (life sciences)  . Smokeless tobacco: Never Used  Substance Use Topics  . Alcohol use: Yes    Alcohol/week: 0.0 standard drinks    Comment: on occasion  . Drug use: No     MEDICATIONS   Current Medication:  Current Facility-Administered Medications:  .  acetaminophen (TYLENOL) tablet 650 mg, 650 mg, Oral, Q6H PRN, 650 mg at 08/01/20 2221 **OR** acetaminophen (TYLENOL) suppository 650 mg, 650 mg, Rectal, Q6H PRN, Agbata, Tochukwu, MD .  amiodarone (PACERONE) tablet 200 mg, 200 mg, Per Tube, BID, Lanney Gins, Iriana Artley, MD, 200 mg at 08/05/20 0910 .  apixaban (ELIQUIS) tablet 5 mg, 5 mg, Per Tube, BID, Lanney Gins, Reford Olliff, MD, 5 mg at 08/05/20 0910 .  ascorbic acid (VITAMIN C) tablet 500 mg, 500 mg, Per Tube, Daily, Ottie Glazier, MD, 500 mg at 08/05/20 0910 .  aspirin chewable tablet 81 mg, 81 mg, Per Tube, Daily, Ottie Glazier, MD, 81 mg at 08/05/20 0910 .  baricitinib (OLUMIANT) tablet 2 mg, 2 mg, Per Tube, Daily, Ottie Glazier, MD, 2 mg at 08/05/20 0910 .  chlorhexidine gluconate (MEDLINE KIT) (PERIDEX) 0.12 % solution 15 mL, 15 mL, Mouth Rinse, BID, Akingbade, Jimi O, MD, 15 mL  at 08/05/20 0800 .  Chlorhexidine Gluconate Cloth 2 % PADS 6 each, 6 each, Topical, Daily, Tyler Pita, MD, 6 each at 08/05/20 0915 .  cholecalciferol (VITAMIN D3) tablet 2,000 Units, 2,000 Units, Per Tube, Daily, Ottie Glazier, MD, 2,000 Units at 08/05/20 0911 .  dexmedetomidine (PRECEDEX) 400 MCG/100ML (4 mcg/mL) infusion, 0.4-1.2 mcg/kg/hr, Intravenous, Titrated, Tanielle Emigh, MD, Last Rate: 19.06 mL/hr at 08/05/20 1154, 0.7 mcg/kg/hr at 08/05/20 1154 .  docusate (COLACE) 50 MG/5ML liquid 50 mg, 50 mg, Per Tube, BID, Lanney Gins, Wealthy Danielski, MD, 50 mg at 08/05/20 0911 .  feeding supplement (PROSource TF) liquid 45 mL, 45 mL, Per Tube, TID, Shaw Dobek, MD, 45 mL at 08/05/20 0915 .  feeding supplement (VITAL HIGH PROTEIN) liquid 1,000 mL, 1,000 mL, Per Tube, Continuous, Nickolus Wadding, MD, Last Rate: 50 mL/hr at 08/05/20 0600, Rate Verify at 08/05/20 0600 .  fentaNYL 2557mg in NS 2530m(1053mml) infusion-PREMIX, 0-400 mcg/hr, Intravenous, Continuous, Hall, Scott A, RPH, Last Rate: 20 mL/hr at 08/05/20 0600, 200 mcg/hr at 08/05/20 0600 .  Gerhardt's butt cream, , Topical, TID, Agbata, Tochukwu, MD, Given at 08/05/20 0915 .  guaiFENesin-dextromethorphan (ROBITUSSIN DM) 100-10 MG/5ML syrup 10 mL, 10 mL, Oral, Q4H PRN, Agbata, Tochukwu, MD .  haloperidol lactate (HALDOL) injection 5 mg, 5 mg, Intravenous, Q6H PRN, PatPara SkeansD .  HYDROcodone-acetaminophen (NORCO/VICODIN) 5-325 MG per tablet 1 tablet, 1 tablet, Oral, Q6H PRN, OumLang SnowP, 1 tablet at 07/25/20 0143 .  insulin aspart (novoLOG) injection 0-9 Units, 0-9 Units, Subcutaneous, Q4H, BlaAwilda BillP, 3 Units at 08/05/20 1154 .  MEDLINE mouth rinse, 15 mL, Mouth Rinse, 10 times per day, Akingbade, JimSamuella CotaD, 15 mL at 08/05/20 1155 .  metoprolol tartrate (LOPRESSOR) injection 5 mg, 5 mg, Intravenous, Q2H PRN, Callwood, Dwayne D, MD, 5 mg at 07/30/20 0920 .  midazolam (VERSED) 50 mg/50 mL (1 mg/mL)  premix  infusion, 0.5-10 mg/hr, Intravenous, Continuous, Tyler Pita, MD .  multivitamin with minerals tablet 1 tablet, 1 tablet, Per Tube, Daily, Ottie Glazier, MD, 1 tablet at 08/05/20 0910 .  norepinephrine (LEVOPHED) 16 mg in 237m premix infusion, 0-40 mcg/min, Intravenous, Titrated, AOttie Glazier MD, Stopped at 07/31/20 1806 .  nystatin (MYCOSTATIN/NYSTOP) topical powder, , Topical, TID, PDarliss Cheney MD, Given at 08/05/20 0915 .  ondansetron (ZOFRAN) tablet 4 mg, 4 mg, Oral, Q6H PRN **OR** ondansetron (ZOFRAN) injection 4 mg, 4 mg, Intravenous, Q6H PRN, Agbata, Tochukwu, MD .  pneumococcal 23 valent vaccine (PNEUMOVAX-23) injection 0.5 mL, 0.5 mL, Intramuscular, Tomorrow-1000, Pahwani, Ravi, MD .  [COMPLETED] methylPREDNISolone sodium succinate (SOLU-MEDROL) 40 mg/mL injection 39.6 mg, 0.5 mg/kg, Intravenous, Q12H, 39.6 mg at 07/27/20 0542 **FOLLOWED BY** predniSONE (DELTASONE) tablet 50 mg, 50 mg, Per Tube, Daily, AOttie Glazier MD, 50 mg at 08/05/20 0910 .  propofol (DIPRIVAN) 1000 MG/100ML infusion, 5-80 mcg/kg/min, Intravenous, Titrated, GTyler Pita MD, Last Rate: 7.13 mL/hr at 08/05/20 0600, 15 mcg/kg/min at 08/05/20 0600 .  vasopressin (PITRESSIN) 20 Units in sodium chloride 0.9 % 100 mL infusion-*FOR SHOCK*, 0-0.03 Units/min, Intravenous, Continuous, AOttie Glazier MD, Stopped at 08/04/20 0147 .  vecuronium (NORCURON) injection 10 mg, 10 mg, Intravenous, Q1H PRN, KDarel HongD, NP, 10 mg at 08/04/20 0636 .  zinc sulfate capsule 220 mg, 220 mg, Per Tube, Daily, AOttie Glazier MD, 220 mg at 08/05/20 0910    ALLERGIES   Codeine    REVIEW OF SYSTEMS    Unable to perform due to Sedation on MV  PHYSICAL EXAMINATION   Vital Signs: Temp:  [95.5 F (35.3 C)-99.1 F (37.3 C)] 98.9 F (37.2 C) (10/02 1200) Pulse Rate:  [73-98] 93 (10/02 0600) Resp:  [30-32] 30 (10/02 0600) BP: (118-160)/(78-105) 131/78 (10/02 0600) SpO2:  [90 %-96 %] 91 % (10/02  0856) FiO2 (%):  [40 %] 40 % (10/02 1200) Weight:  [108.4 kg] 108.4 kg (10/02 0500)  GENERAL:intubated on MV age appropriate HEAD: Normocephalic, atraumatic.  EYES: Pupils equal, round, reactive to light.  No scleral icterus.  MOUTH: Moist mucosal membrane. NECK: Supple. No thyromegaly. No nodules. No JVD.  PULMONARY: crackles at bases difficult to appreciate due to MV CARDIOVASCULAR: S1 and S2. Regular rate and rhythm. No murmurs, rubs, or gallops.  GASTROINTESTINAL: Soft, nontender, non-distended. No masses. Positive bowel sounds. No hepatosplenomegaly.  MUSCULOSKELETAL: No swelling, clubbing, or edema.  NEUROLOGIC: Mild distress due to acute illness SKIN:intact,warm,dry   PERTINENT DATA     Infusions: . dexmedetomidine (PRECEDEX) IV infusion 0.7 mcg/kg/hr (08/05/20 1154)  . feeding supplement (VITAL HIGH PROTEIN) 50 mL/hr at 08/05/20 0600  . fentaNYL infusion INTRAVENOUS 200 mcg/hr (08/05/20 0600)  . midazolam    . norepinephrine (LEVOPHED) Adult infusion Stopped (07/31/20 1806)  . propofol (DIPRIVAN) infusion 15 mcg/kg/min (08/05/20 0600)  . vasopressin Stopped (08/04/20 0147)   Scheduled Medications: . amiodarone  200 mg Per Tube BID  . apixaban  5 mg Per Tube BID  . vitamin C  500 mg Per Tube Daily  . aspirin  81 mg Per Tube Daily  . baricitinib  2 mg Per Tube Daily  . chlorhexidine gluconate (MEDLINE KIT)  15 mL Mouth Rinse BID  . Chlorhexidine Gluconate Cloth  6 each Topical Daily  . cholecalciferol  2,000 Units Per Tube Daily  . docusate  50 mg Per Tube BID  . feeding supplement (PROSource TF)  45 mL Per Tube TID  . Gerhardt's butt cream  Topical TID  . insulin aspart  0-9 Units Subcutaneous Q4H  . mouth rinse  15 mL Mouth Rinse 10 times per day  . multivitamin with minerals  1 tablet Per Tube Daily  . nystatin   Topical TID  . pneumococcal 23 valent vaccine  0.5 mL Intramuscular Tomorrow-1000  . predniSONE  50 mg Per Tube Daily  . zinc sulfate  220 mg Per  Tube Daily   PRN Medications: acetaminophen **OR** acetaminophen, guaiFENesin-dextromethorphan, haloperidol lactate, HYDROcodone-acetaminophen, metoprolol tartrate, ondansetron **OR** ondansetron (ZOFRAN) IV, vecuronium Hemodynamic parameters:   Intake/Output: 10/01 0701 - 10/02 0700 In: 3427.7 [I.V.:1427.7; NG/GT:2000] Out: 3150 [Urine:3000; Emesis/NG output:150]  Ventilator  Settings: Vent Mode: PRVC FiO2 (%):  [40 %] 40 % Set Rate:  [30 bmp] 30 bmp Vt Set:  [430 mL] 430 mL PEEP:  [5 cmH20] 5 cmH20 Plateau Pressure:  [23 cmH20] 23 cmH20     LAB RESULTS:  Basic Metabolic Panel: Recent Labs  Lab 07/31/20 0653 07/31/20 0653 08/01/20 0500 08/01/20 0500 08/02/20 0430 08/02/20 0430 08/03/20 0430 08/04/20 0400 08/05/20 0430  NA 138  --  136  --  136  --  139 141  --   K 4.7   < > 5.1   < > 5.3*   < > 4.8 4.3  --   CL 99  --  101  --  102  --  104 104  --   CO2 28  --  25  --  26  --  26 27  --   GLUCOSE 145*  --  160*  --  165*  --  182* 181*  --   BUN 32*  --  43*  --  50*  --  56* 64*  --   CREATININE 2.06*  --  2.27*  --  2.07*  --  1.65* 1.42*  --   CALCIUM 10.1  --  9.4  --  9.5  --  9.5 9.5  --   MG 2.4   < > 2.1  --  2.1  --  2.1 2.0 2.0  PHOS  --   --  5.0*  --  4.4  --  3.6 3.0  --    < > = values in this interval not displayed.   Liver Function Tests: Recent Labs  Lab 08/02/20 0430  ALBUMIN 2.1*   No results for input(s): LIPASE, AMYLASE in the last 168 hours. No results for input(s): AMMONIA in the last 168 hours. CBC: Recent Labs  Lab 07/30/20 0449 08/02/20 0430  WBC 19.0* 14.0*  NEUTROABS  --  12.7*  HGB 15.4* 12.2  HCT 45.4 37.9  MCV 83.6 86.3  PLT 387 107*   Cardiac Enzymes: No results for input(s): CKTOTAL, CKMB, CKMBINDEX, TROPONINI in the last 168 hours. BNP: Invalid input(s): POCBNP CBG: Recent Labs  Lab 08/04/20 2024 08/04/20 2349 08/05/20 0420 08/05/20 0749 08/05/20 1118  GLUCAP 241* 215* 148* 185* 202*       IMAGING  RESULTS:  Imaging: No results found.  ASSESSMENT AND PLAN    -Multidisciplinary rounds held today Acute respiratory failure with hypoxia due to COVID-19 ARDS COVID-19 pneumonia Proceed with intubation and mechanical ventilation ARDS protocol, 6 cc/kg of PBW Recruitment maneuvers as necessary Prone positioning if no improvement Completed remdesivir Continue Rocephin and azithromycin for now  Atrial fibrillation with RVR Due to physiologic stress/hypoxia Hx: Hyperthyroidism treated with RAI Continue amiodarone and calcium channel blocker Cardiology following Continue cardiac monitoring Correct electrolytes as needed Heparin infusion  Severe intertrigo Multiple skin breakdown areas Wound care following  ID -continue IV abx as prescibed -follow up cultures  GI/Nutrition GI PROPHYLAXIS as indicated DIET-->TF's as tolerated Constipation protocol as indicated  ENDO - ICU hypoglycemic\Hyperglycemia protocol -check FSBS per protocol   ELECTROLYTES -follow labs as needed -replace as needed -pharmacy consultation   DVT/GI PRX ordered -SCDs  TRANSFUSIONS AS NEEDED MONITOR FSBS ASSESS the need for LABS as needed   Critical care provider statement:    Critical care time (minutes):  33   Critical care time was exclusive of:  Separately billable procedures and treating other patients   Critical care was necessary to treat or prevent imminent or life-threatening deterioration of the following conditions:  Acute hypoxemic respiratory failure, multiple comoribid conditions.    Critical care was time spent personally by me on the following activities:  Development of treatment plan with patient or surrogate, discussions with consultants, evaluation of patient's response to treatment, examination of patient, obtaining history from patient or surrogate, ordering and performing treatments and interventions, ordering and review of laboratory studies and re-evaluation of  patient's condition.  I assumed direction of critical care for this patient from another provider in my specialty: no    This document was prepared using Dragon voice recognition software and may include unintentional dictation errors.    Ottie Glazier, M.D.  Division of [place]

## 2020-08-05 NOTE — Discharge Instructions (Signed)

## 2020-08-06 LAB — CBC WITH DIFFERENTIAL/PLATELET
Abs Immature Granulocytes: 0.11 10*3/uL — ABNORMAL HIGH (ref 0.00–0.07)
Basophils Absolute: 0 10*3/uL (ref 0.0–0.1)
Basophils Relative: 0 %
Eosinophils Absolute: 0 10*3/uL (ref 0.0–0.5)
Eosinophils Relative: 1 %
HCT: 36.5 % (ref 36.0–46.0)
Hemoglobin: 12 g/dL (ref 12.0–15.0)
Immature Granulocytes: 1 %
Lymphocytes Relative: 6 %
Lymphs Abs: 0.5 10*3/uL — ABNORMAL LOW (ref 0.7–4.0)
MCH: 27.7 pg (ref 26.0–34.0)
MCHC: 32.9 g/dL (ref 30.0–36.0)
MCV: 84.3 fL (ref 80.0–100.0)
Monocytes Absolute: 0.7 10*3/uL (ref 0.1–1.0)
Monocytes Relative: 9 %
Neutro Abs: 6.5 10*3/uL (ref 1.7–7.7)
Neutrophils Relative %: 83 %
Platelets: 131 10*3/uL — ABNORMAL LOW (ref 150–400)
RBC: 4.33 MIL/uL (ref 3.87–5.11)
RDW: 15.3 % (ref 11.5–15.5)
WBC: 7.9 10*3/uL (ref 4.0–10.5)
nRBC: 0 % (ref 0.0–0.2)

## 2020-08-06 LAB — GLUCOSE, CAPILLARY
Glucose-Capillary: 169 mg/dL — ABNORMAL HIGH (ref 70–99)
Glucose-Capillary: 151 mg/dL — ABNORMAL HIGH (ref 70–99)
Glucose-Capillary: 207 mg/dL — ABNORMAL HIGH (ref 70–99)
Glucose-Capillary: 244 mg/dL — ABNORMAL HIGH (ref 70–99)
Glucose-Capillary: 229 mg/dL — ABNORMAL HIGH (ref 70–99)
Glucose-Capillary: 237 mg/dL — ABNORMAL HIGH (ref 70–99)

## 2020-08-06 LAB — BASIC METABOLIC PANEL
Anion gap: 5 (ref 5–15)
BUN: 55 mg/dL — ABNORMAL HIGH (ref 8–23)
CO2: 30 mmol/L (ref 22–32)
Calcium: 9.4 mg/dL (ref 8.9–10.3)
Chloride: 109 mmol/L (ref 98–111)
Creatinine, Ser: 1.11 mg/dL — ABNORMAL HIGH (ref 0.44–1.00)
GFR calc Af Amer: >60 mL/min (ref >60)
GFR calc non Af Amer: 52 mL/min — ABNORMAL LOW (ref >60)
Glucose, Bld: 178 mg/dL — ABNORMAL HIGH (ref 70–99)
Potassium: 4.1 mmol/L (ref 3.5–5.1)
Sodium: 144 mmol/L (ref 135–145)

## 2020-08-06 LAB — MAGNESIUM: Magnesium: 2 mg/dL (ref 1.7–2.4)

## 2020-08-06 MED ORDER — MIDAZOLAM HCL 2 MG/2ML IJ SOLN: 4.0000 mg | Freq: Once | INTRAMUSCULAR | Status: AC

## 2020-08-06 MED ORDER — MIDAZOLAM HCL 2 MG/2ML IJ SOLN
INTRAMUSCULAR | Status: AC
  Administered 2020-08-06: 4 mg via INTRAVENOUS
  Filled 2020-08-06: qty 4

## 2020-08-06 MED ORDER — METOPROLOL TARTRATE 5 MG/5ML IV SOLN
2.5000 mg | Freq: Once | INTRAVENOUS | Status: AC
  Administered 2020-08-06: 2.5 mg via INTRAVENOUS
  Filled 2020-08-06: qty 5

## 2020-08-06 NOTE — Progress Notes (Signed)
CRITICAL CARE PROGRESS NOTE    Name: Courtney Anderson MRN: [id number] DOB: 02/05/1956     LOS: 91   SUBJECTIVE FINDINGS & SIGNIFICANT EVENTS    Patient description:  Reason for Consult: Severe respiratory distress and patient with COVID-19 pneumonia. Referring Physician: Florina Ou, MD  Courtney Anderson is an 64 y.o. female.  HPI: Patient is a 64 year old female with history noted below, who was admitted to [facility] on 2020-07-24 brought in via EMS as a "code sepsis".  Patient subsequently tested positive for COVID-19.  She presented with oxygen saturations of 69% on room air and had to be placed on nonrebreather mask to achieve oxygen saturations in the 90s.  She was subsequently admitted to the Covid unit for management of COVID-19 pneumonia.  She was noted to have acute kidney injury she was treated with remdesivir, zinc, vitamin C and antitussives.  She was placed empirically on Rocephin and azithromycin.  She continued to have increased FiO2 requirements.  She was refusing to self prone.  She had atrial fibrillation and was seen by cardiology to assist in management.   07/31/20- patient is on 90% FiO2, will modify meds as patient is on metoprolol and levophed as well as cardizem.  Will d/c propofol to improve SBP and remove arythmogenic vasopressor if able.  Plan to have CVP trending and diuresis today.   Goals of care discussion today - patient made DNR per sister and son of patient. I had 2 separate phone meetings with son and separate conversation with sister today to explain patient is worse with shock and renal failure requiring multiple pressors.   08/01/20- patient remains critically ill. FiO2 has been weaned down to 80% today.  08/03/20- patient remains critically ill shes being proned, weaning FiO2 as able.    08/04/20- patient with no interval changes continue with weaning and proning protocol.  Continue weaning FiO2 and PEEP as to optimize for SBT 08/06/20- patient is weaned down 40%FiO2 today, will attempt awakening trial with SBT, I spoke to son [name] today, he appreciates update and does not want Elink video view at this time.     Lines/tubes : Airway 7.5 mm (Active)  Secured at (cm) 23 cm 07/31/20 0806  Measured From Lips 07/31/20 [place] 07/31/20 0806  Secured By [employer] 07/31/20 0806  Tube Holder Repositioned Yes 07/31/20 0806  Cuff Pressure (cm H2O) 26 cm H2O 07/31/20 0806  Site Condition Dry 07/31/20 0806     CVC Triple Lumen 07/30/20 Left Internal jugular (Active)  Indication for Insertion or Continuance of Line Vasoactive infusions 07/30/20 2010  Site Assessment Clean;Dry;Intact 07/30/20 2010  Proximal Lumen Status Infusing 07/30/20 2010  Medial Lumen Status Infusing 07/30/20 2010  Distal Lumen Status Infusing 07/30/20 2010  Dressing Type Transparent;Occlusive 07/30/20 2010  Dressing Status Clean;Dry;Intact 07/30/20 2010  Line Care Connections checked and tightened 07/30/20 2010  Dressing Change Due 08/06/20 07/30/20 2010     NG/OG Tube [facility] mouth Xray Documented cm marking at nare/ corner of mouth 75 cm (Active)  Cm Marking at Nare/Corner of Mouth (if applicable) 75 cm 91/63/84 2010  Site Assessment Clean;Dry;Intact 07/30/20 2010  Ongoing Placement Verification No change in cm markings or external length of tube from initial placement;No acute changes, not attributed to clinical condition;No change in respiratory status;Xray 07/30/20 2010  Status Suction-low intermittent 07/30/20 2010  Amount of suction 80 mmHg 07/30/20 2010  Drainage Appearance [place] Shark;Coffee ground 07/30/20 2010  Urethral Catheter [place];Latex 16 Fr. (Active)  Indication for Insertion or Continuance of Catheter Unstable critically ill  patients first 24-48 hours (See Criteria) 07/31/20 0752  Site Assessment Clean;Intact 07/30/20 2000  Catheter Maintenance Bag below level of bladder;Catheter secured;Drainage bag/tubing not touching floor;Insertion date on drainage bag;No dependent loops;Seal intact 07/30/20 2000  Collection Container Standard drainage bag 07/30/20 2000  Securement Method Securing device (Describe) 07/30/20 2000  Urinary Catheter Interventions (if applicable) Unclamped 19/62/22 2000  Output (mL) 75 mL 07/31/20 0600    Microbiology/Sepsis markers: Results for orders placed or performed during the hospital encounter of 07/24/20  Urine culture     Status: Abnormal   Collection Time: 07/24/20  9:12 AM   Specimen: Urine, Random  Result Value Ref Range Status   Specimen Description   Final    URINE, RANDOM Performed at [facility], [address] [place], [place] [address]    Special Requests   Final    NONE Performed at [facility], [address] [place], [place] [address]    Culture (A)  Final    <10,000 COLONIES/mL >=100,000 COLONIES/mL Performed at [facility], [place] [address], [place], [place] [address]    Report Status 07/25/2020 FINAL  Final  Blood culture (routine single)     Status: None   Collection Time: 07/24/20  9:14 AM   Specimen: BLOOD LEFT ARM  Result Value Ref Range Status   Specimen Description BLOOD LEFT ARM  Final   Special Requests   Final    BOTTLES DRAWN AEROBIC AND ANAEROBIC Blood Culture adequate volume   Culture   Final    NO GROWTH 5 DAYS Performed at [facility], [address] [place], [place] [address]    Report Status 07/29/2020 FINAL  Final  SARS Coronavirus 2 by RT PCR (hospital order, performed in [place] hospital lab) Nasopharyngeal Nasopharyngeal Swab     Status: Abnormal   Collection Time: 07/24/20  9:16 AM   Specimen: Nasopharyngeal Swab  Result Value Ref Range Status   SARS Coronavirus 2 POSITIVE (A) NEGATIVE  Final    Comment: RESULT CALLED TO, READ BACK BY AND VERIFIED WITH:  SAMANTHA HAMILTON AT 8563 07/24/20 SDR (NOTE) SARS-CoV-2 target nucleic acids are DETECTED  SARS-CoV-2 RNA is generally detectable in upper respiratory specimens  during the acute phase of infection.  Positive results are indicative  of the presence of the identified virus, but do not rule out bacterial infection or co-infection with other pathogens not detected by the test.  Clinical correlation with patient history and  other diagnostic information is necessary to determine patient infection status.  The expected result is negative.  Fact Sheet for Patients:   StrictlyIdeas.no   Fact Sheet for Healthcare Providers:   BankingDealers.co.za    This test is not yet approved or cleared by the [place] FDA and  has been authorized for detection and/or diagnosis of SARS-CoV-2 by FDA under an Emergency Use Authorization (EUA).  This EUA will remain in effect (meaning thi s test can be used) for the duration of  the COVID-19 declaration under Section 564(b)(1) of the Act, 21 U.S.C. section 360-bbb-3(b)(1), unless the authorization is terminated or revoked sooner.  Performed at [facility], [address] [place], [place] [address]   MRSA PCR Screening     Status: None   Collection Time: 07/30/20 11:08 AM   Specimen: Nasopharyngeal  Result Value Ref Range Status   MRSA by PCR  NEGATIVE NEGATIVE Final    Comment:        The GeneXpert MRSA Assay (FDA approved for NASAL specimens only), is one component of a comprehensive MRSA colonization surveillance program. It is not intended to diagnose MRSA infection nor to guide or monitor treatment for MRSA infections. Performed at [facility], [address] [place], [place] [address]   Culture, respiratory (non-expectorated)     Status: None   Collection Time: 08/01/20 10:46 AM   Specimen:  Tracheal Aspirate; Respiratory  Result Value Ref Range Status   Specimen Description   Final    TRACHEAL ASPIRATE Performed at [facility], [address] [place], [place] [address]    Special Requests   Final    NONE Performed at [facility], [place], [place] [address]    Gram Stain NO WBC SEEN NO ORGANISMS SEEN   Final   Culture   Final    RARE Normal respiratory flora-no Staph aureus or Pseudomonas seen Performed at [place] [facility] [address], [place], [place] [address]    Report Status 08/03/2020 FINAL  Final    Anti-infectives:  Anti-infectives (From admission, onward)   Start     Dose/Rate Route Frequency Ordered Stop   08/01/20 1200  ceFEPIme (MAXIPIME) 2 g in sodium chloride 0.9 % 100 mL IVPB  Status:  Discontinued        2 g 200 mL/hr over 30 Minutes Intravenous Every 12 hours 08/01/20 1026 08/03/20 1014   07/25/20 1000  azithromycin (ZITHROMAX) 500 mg in sodium chloride 0.9 % 250 mL IVPB  Status:  Discontinued        500 mg 250 mL/hr over 60 Minutes Intravenous Every 24 hours 07/24/20 1108 07/24/20 1317   07/25/20 1000  remdesivir 100 mg in sodium chloride 0.9 % 100 mL IVPB       "Followed by" Linked Group Details   100 mg 200 mL/hr over 30 Minutes Intravenous Daily 07/24/20 1112 07/28/20 1544   07/24/20 1300  fluconazole (DIFLUCAN) tablet 100 mg  Status:  Discontinued        100 mg Oral Daily 07/24/20 1251 07/25/20 1522   07/24/20 1200  remdesivir 200 mg in sodium chloride 0.9% 250 mL IVPB       "Followed by" Linked Group Details   200 mg 580 mL/hr over 30 Minutes Intravenous Once 07/24/20 1112 07/24/20 1424   07/24/20 1115  cefTRIAXone (ROCEPHIN) 2 g in sodium chloride 0.9 % 100 mL IVPB  Status:  Discontinued        2 g 200 mL/hr over 30 Minutes Intravenous Every 24 hours 07/24/20 1108 07/24/20 1113   07/24/20 1000  cefTRIAXone (ROCEPHIN) 2 g in sodium chloride 0.9 % 100 mL IVPB  Status:  Discontinued        2  g 200 mL/hr over 30 Minutes Intravenous Every 24 hours 07/24/20 0947 07/28/20 1438   07/24/20 1000  azithromycin (ZITHROMAX) 500 mg in sodium chloride 0.9 % 250 mL IVPB  Status:  Discontinued        500 mg 250 mL/hr over 60 Minutes Intravenous Every 24 hours 07/24/20 0947 07/28/20 1438       Consults: Treatment Team:  Yolonda Kida, MD Dionisio David, MD     PAST MEDICAL HISTORY   Past Medical History:  Diagnosis Date  . Allergy   . Elevated transaminase level   . Hyperlipidemia   . Hypertension   . Obesity  SURGICAL HISTORY   Past Surgical History:  Procedure Laterality Date  . BREAST SURGERY    . ECTOPIC PREGNANCY SURGERY       FAMILY HISTORY   Family History  Problem Relation Age of Onset  . Hypertension Father   . Stroke Father   . Hypertension Mother   . Arthritis Mother   . Aneurysm Sister   . Hypertension Sister   . Hypertension Brother   . Hypertension Sister   . Hypertension Brother      SOCIAL HISTORY   Social History   Tobacco Use  . Smoking status: Former Research scientist (life sciences)  . Smokeless tobacco: Never Used  Substance Use Topics  . Alcohol use: Yes    Alcohol/week: 0.0 standard drinks    Comment: on occasion  . Drug use: No     MEDICATIONS   Current Medication:  Current Facility-Administered Medications:  .  acetaminophen (TYLENOL) tablet 650 mg, 650 mg, Oral, Q6H PRN, 650 mg at 08/01/20 2221 **OR** acetaminophen (TYLENOL) suppository 650 mg, 650 mg, Rectal, Q6H PRN, Agbata, Tochukwu, MD .  amiodarone (PACERONE) tablet 200 mg, 200 mg, Per Tube, BID, Lanney Gins, Priscillia Fouch, MD, 200 mg at 08/06/20 0813 .  apixaban (ELIQUIS) tablet 5 mg, 5 mg, Per Tube, BID, Lanney Gins, Takisha Pelle, MD, 5 mg at 08/06/20 0814 .  ascorbic acid (VITAMIN C) tablet 500 mg, 500 mg, Per Tube, Daily, Lanney Gins, Jayanna Kroeger, MD, 500 mg at 08/06/20 0814 .  aspirin chewable tablet 81 mg, 81 mg, Per Tube, Daily, Lanney Gins, Evelynne Spiers, MD, 81 mg at 08/06/20 0814 .  chlorhexidine gluconate  (MEDLINE KIT) (PERIDEX) 0.12 % solution 15 mL, 15 mL, Mouth Rinse, BID, Akingbade, Jimi O, MD, 15 mL at 08/06/20 0835 .  Chlorhexidine Gluconate Cloth 2 % PADS 6 each, 6 each, Topical, Daily, Tyler Pita, MD, 6 each at 08/06/20 0010 .  cholecalciferol (VITAMIN D3) tablet 2,000 Units, 2,000 Units, Per Tube, Daily, Ottie Glazier, MD, 2,000 Units at 08/06/20 0814 .  dexmedetomidine (PRECEDEX) 400 MCG/100ML (4 mcg/mL) infusion, 0.4-1.2 mcg/kg/hr, Intravenous, Titrated, Antwyne Pingree, MD, Last Rate: 24.5 mL/hr at 08/06/20 1221, 0.9 mcg/kg/hr at 08/06/20 1221 .  docusate (COLACE) 50 MG/5ML liquid 50 mg, 50 mg, Per Tube, BID, Lanney Gins, Alessio Bogan, MD, 50 mg at 08/05/20 2208 .  feeding supplement (PROSource TF) liquid 45 mL, 45 mL, Per Tube, TID, Lanney Gins, Montana Bryngelson, MD, 45 mL at 08/06/20 0814 .  feeding supplement (VITAL HIGH PROTEIN) liquid 1,000 mL, 1,000 mL, Per Tube, Continuous, Danial Hlavac, MD, Last Rate: 50 mL/hr at 08/05/20 1744, 1,000 mL at 08/05/20 1744 .  fentaNYL 2534mg in NS 2580m(1027mml) infusion-PREMIX, 0-400 mcg/hr, Intravenous, Continuous, Hall, Scott A, RPH, Last Rate: 20 mL/hr at 08/06/20 0600, 200 mcg/hr at 08/06/20 0600 .  Gerhardt's butt cream, , Topical, TID, Agbata, Tochukwu, MD, Given at 08/06/20 1000 .  guaiFENesin-dextromethorphan (ROBITUSSIN DM) 100-10 MG/5ML syrup 10 mL, 10 mL, Oral, Q4H PRN, Agbata, Tochukwu, MD .  haloperidol lactate (HALDOL) injection 5 mg, 5 mg, Intravenous, Q6H PRN, PatPara SkeansD .  HYDROcodone-acetaminophen (NORCO/VICODIN) 5-325 MG per tablet 1 tablet, 1 tablet, Oral, Q6H PRN, OumLang SnowP, 1 tablet at 07/25/20 0143 .  insulin aspart (novoLOG) injection 0-9 Units, 0-9 Units, Subcutaneous, Q4H, BlaAwilda BillP, 3 Units at 08/06/20 1316 .  MEDLINE mouth rinse, 15 mL, Mouth Rinse, 10 times per day, Akingbade, JimSamuella CotaD, 15 mL at 08/06/20 1326 .  metoprolol tartrate (LOPRESSOR) injection 5 mg, 5 mg, Intravenous, Q2H PRN,  Callwood, Dwayne  D, MD, 5 mg at 07/30/20 0920 .  midazolam (VERSED) 50 mg in dextrose 5 % 50 mL (1 mg/mL) infusion, 0.5-10 mg/hr, Intravenous, Continuous, Shanlever, Pierce Crane, RPH, Last Rate: 6 mL/hr at 08/06/20 1105, 6 mg/hr at 08/06/20 1105 .  multivitamin with minerals tablet 1 tablet, 1 tablet, Per Tube, Daily, Ottie Glazier, MD, 1 tablet at 08/06/20 0814 .  norepinephrine (LEVOPHED) 16 mg in 247m premix infusion, 0-40 mcg/min, Intravenous, Titrated, AOttie Glazier MD, Stopped at 07/31/20 1806 .  nystatin (MYCOSTATIN/NYSTOP) topical powder, , Topical, TID, PDarliss Cheney MD, Given at 08/06/20 1000 .  ondansetron (ZOFRAN) tablet 4 mg, 4 mg, Oral, Q6H PRN **OR** ondansetron (ZOFRAN) injection 4 mg, 4 mg, Intravenous, Q6H PRN, Agbata, Tochukwu, MD .  pneumococcal 23 valent vaccine (PNEUMOVAX-23) injection 0.5 mL, 0.5 mL, Intramuscular, Tomorrow-1000, Pahwani, Ravi, MD .  [COMPLETED] methylPREDNISolone sodium succinate (SOLU-MEDROL) 40 mg/mL injection 39.6 mg, 0.5 mg/kg, Intravenous, Q12H, 39.6 mg at 07/27/20 0542 **FOLLOWED BY** predniSONE (DELTASONE) tablet 50 mg, 50 mg, Per Tube, Daily, AOttie Glazier MD, 50 mg at 08/06/20 0814 .  propofol (DIPRIVAN) 1000 MG/100ML infusion, 5-80 mcg/kg/min, Intravenous, Titrated, GTyler Pita MD, Stopped at 08/06/20 0244 .  vasopressin (PITRESSIN) 20 Units in sodium chloride 0.9 % 100 mL infusion-*FOR SHOCK*, 0-0.03 Units/min, Intravenous, Continuous, AOttie Glazier MD, Stopped at 08/04/20 0147 .  vecuronium (NORCURON) injection 10 mg, 10 mg, Intravenous, Q1H PRN, KDarel HongD, NP, 10 mg at 08/04/20 0636 .  zinc sulfate capsule 220 mg, 220 mg, Per Tube, Daily, AOttie Glazier MD, 220 mg at 08/06/20 0814    ALLERGIES   Codeine    REVIEW OF SYSTEMS    Unable to perform due to Sedation on MV  PHYSICAL EXAMINATION   Vital Signs: Temp:  [97.7 F (36.5 C)-99.8 F (37.7 C)] 99.8 F (37.7 C) (10/03 1300) Pulse Rate:  [34-114] 58  (10/03 1300) Resp:  [22-30] 30 (10/03 1300) BP: (109-151)/(70-110) 151/94 (10/03 1300) SpO2:  [91 %-100 %] 95 % (10/03 1300) FiO2 (%):  [40 %] 40 % (10/03 0901) Weight:  [108.4 kg] 108.4 kg (10/03 0412)  GENERAL:intubated on MV age appropriate HEAD: Normocephalic, atraumatic.  EYES: Pupils equal, round, reactive to light.  No scleral icterus.  MOUTH: Moist mucosal membrane. NECK: Supple. No thyromegaly. No nodules. No JVD.  PULMONARY: crackles at bases difficult to appreciate due to MV CARDIOVASCULAR: S1 and S2. Regular rate and rhythm. No murmurs, rubs, or gallops.  GASTROINTESTINAL: Soft, nontender, non-distended. No masses. Positive bowel sounds. No hepatosplenomegaly.  MUSCULOSKELETAL: No swelling, clubbing, or edema.  NEUROLOGIC: Mild distress due to acute illness SKIN:intact,warm,dry   PERTINENT DATA     Infusions: . dexmedetomidine (PRECEDEX) IV infusion 0.9 mcg/kg/hr (08/06/20 1221)  . feeding supplement (VITAL HIGH PROTEIN) 1,000 mL (08/05/20 1744)  . fentaNYL infusion INTRAVENOUS 200 mcg/hr (08/06/20 0600)  . midazolam 6 mg/hr (08/06/20 1105)  . norepinephrine (LEVOPHED) Adult infusion Stopped (07/31/20 1806)  . propofol (DIPRIVAN) infusion Stopped (08/06/20 0244)  . vasopressin Stopped (08/04/20 0147)   Scheduled Medications: . amiodarone  200 mg Per Tube BID  . apixaban  5 mg Per Tube BID  . vitamin C  500 mg Per Tube Daily  . aspirin  81 mg Per Tube Daily  . chlorhexidine gluconate (MEDLINE KIT)  15 mL Mouth Rinse BID  . Chlorhexidine Gluconate Cloth  6 each Topical Daily  . cholecalciferol  2,000 Units Per Tube Daily  . docusate  50 mg Per Tube BID  . feeding supplement (PROSource  TF)  45 mL Per Tube TID  . Gerhardt's butt cream   Topical TID  . insulin aspart  0-9 Units Subcutaneous Q4H  . mouth rinse  15 mL Mouth Rinse 10 times per day  . multivitamin with minerals  1 tablet Per Tube Daily  . nystatin   Topical TID  . pneumococcal 23 valent vaccine  0.5  mL Intramuscular Tomorrow-1000  . predniSONE  50 mg Per Tube Daily  . zinc sulfate  220 mg Per Tube Daily   PRN Medications: acetaminophen **OR** acetaminophen, guaiFENesin-dextromethorphan, haloperidol lactate, HYDROcodone-acetaminophen, metoprolol tartrate, ondansetron **OR** ondansetron (ZOFRAN) IV, vecuronium Hemodynamic parameters:   Intake/Output: 10/02 0701 - 10/03 0700 In: 2497.3 [I.V.:1197.3; NG/GT:1300] Out: 3000 [Urine:3000]  Ventilator  Settings: Vent Mode: PRVC FiO2 (%):  [40 %] 40 % Set Rate:  [30 bmp] 30 bmp Vt Set:  [430 mL] 430 mL PEEP:  [5 cmH20] 5 cmH20 Plateau Pressure:  [17 cmH20-23 cmH20] 23 cmH20     LAB RESULTS:  Basic Metabolic Panel: Recent Labs  Lab 08/01/20 0500 08/01/20 0500 08/02/20 0430 08/02/20 0430 08/03/20 0430 08/03/20 0430 08/04/20 0400 08/05/20 0430 08/06/20 0410  NA 136  --  136  --  139  --  141  --  144  K 5.1   < > 5.3*   < > 4.8   < > 4.3  --  4.1  CL 101  --  102  --  104  --  104  --  109  CO2 25  --  26  --  26  --  27  --  30  GLUCOSE 160*  --  165*  --  182*  --  181*  --  178*  BUN 43*  --  50*  --  56*  --  64*  --  55*  CREATININE 2.27*  --  2.07*  --  1.65*  --  1.42*  --  1.11*  CALCIUM 9.4  --  9.5  --  9.5  --  9.5  --  9.4  MG 2.1   < > 2.1  --  2.1  --  2.0 2.0 2.0  PHOS 5.0*  --  4.4  --  3.6  --  3.0  --   --    < > = values in this interval not displayed.   Liver Function Tests: Recent Labs  Lab 08/02/20 0430  ALBUMIN 2.1*   No results for input(s): LIPASE, AMYLASE in the last 168 hours. No results for input(s): AMMONIA in the last 168 hours. CBC: Recent Labs  Lab 08/02/20 0430 08/06/20 0410  WBC 14.0* 7.9  NEUTROABS 12.7* 6.5  HGB 12.2 12.0  HCT 37.9 36.5  MCV 86.3 84.3  PLT 107* 131*   Cardiac Enzymes: No results for input(s): CKTOTAL, CKMB, CKMBINDEX, TROPONINI in the last 168 hours. BNP: Invalid input(s): POCBNP CBG: Recent Labs  Lab 08/05/20 2008 08/05/20 2339 08/06/20 0342  08/06/20 0737 08/06/20 1132  GLUCAP 246* 198* 169* 151* 207*       IMAGING RESULTS:  Imaging: No results found.  ASSESSMENT AND PLAN    -Multidisciplinary rounds held today Acute respiratory failure with hypoxia due to COVID-19 ARDS COVID-19 pneumonia mechanical ventilation ARDS protocol, 6 cc/kg of PBW Recruitment maneuvers as necessary Prone positioning 18/6 ratio Completed remdesivir Continue Rocephin and azithromycin for now  Atrial fibrillation with RVR Due to physiologic stress/hypoxia Hx: Hyperthyroidism treated with RAI Continue amiodarone and calcium channel blocker Cardiology following Continue cardiac monitoring  Correct electrolytes as needed Heparin infusion  Severe intertrigo Multiple skin breakdown areas Wound care following  ID -continue IV abx as prescibed -follow up cultures  GI/Nutrition GI PROPHYLAXIS as indicated DIET-->TF's as tolerated Constipation protocol as indicated  ENDO - ICU hypoglycemic\Hyperglycemia protocol -check FSBS per protocol   ELECTROLYTES -follow labs as needed -replace as needed -pharmacy consultation   DVT/GI PRX ordered -SCDs  TRANSFUSIONS AS NEEDED MONITOR FSBS ASSESS the need for LABS as needed   Critical care provider statement:    Critical care time (minutes):  33   Critical care time was exclusive of:  Separately billable procedures and treating other patients   Critical care was necessary to treat or prevent imminent or life-threatening deterioration of the following conditions:  Acute hypoxemic respiratory failure, multiple comoribid conditions.    Critical care was time spent personally by me on the following activities:  Development of treatment plan with patient or surrogate, discussions with consultants, evaluation of patient's response to treatment, examination of patient, obtaining history from patient or surrogate, ordering and performing treatments and interventions, ordering and review  of laboratory studies and re-evaluation of patient's condition.  I assumed direction of critical care for this patient from another provider in my specialty: no    This document was prepared using Dragon voice recognition software and may include unintentional dictation errors.    Ottie Anderson, M.D.  Division of [place]

## 2020-08-07 ENCOUNTER — Inpatient Hospital Stay: Payer: Medicare Other

## 2020-08-07 LAB — TROPONIN I (HIGH SENSITIVITY)
Troponin I (High Sensitivity): 10 ng/L (ref <18)
Troponin I (High Sensitivity): 11 ng/L (ref <18)

## 2020-08-07 LAB — RENAL FUNCTION PANEL
Albumin: 2.1 g/dL — ABNORMAL LOW (ref 3.5–5.0)
Anion gap: 8 (ref 5–15)
BUN: 57 mg/dL — ABNORMAL HIGH (ref 8–23)
CO2: 27 mmol/L (ref 22–32)
Calcium: 9.7 mg/dL (ref 8.9–10.3)
Chloride: 109 mmol/L (ref 98–111)
Creatinine, Ser: 1.15 mg/dL — ABNORMAL HIGH (ref 0.44–1.00)
GFR calc Af Amer: 58 mL/min — ABNORMAL LOW (ref >60)
GFR calc non Af Amer: 50 mL/min — ABNORMAL LOW (ref >60)
Glucose, Bld: 155 mg/dL — ABNORMAL HIGH (ref 70–99)
Phosphorus: 2.4 mg/dL — ABNORMAL LOW (ref 2.5–4.6)
Potassium: 4.2 mmol/L (ref 3.5–5.1)
Sodium: 144 mmol/L (ref 135–145)

## 2020-08-07 LAB — GLUCOSE, CAPILLARY
Glucose-Capillary: 148 mg/dL — ABNORMAL HIGH (ref 70–99)
Glucose-Capillary: 153 mg/dL — ABNORMAL HIGH (ref 70–99)
Glucose-Capillary: 200 mg/dL — ABNORMAL HIGH (ref 70–99)
Glucose-Capillary: 226 mg/dL — ABNORMAL HIGH (ref 70–99)
Glucose-Capillary: 265 mg/dL — ABNORMAL HIGH (ref 70–99)
Glucose-Capillary: 275 mg/dL — ABNORMAL HIGH (ref 70–99)

## 2020-08-07 LAB — CBC WITH DIFFERENTIAL/PLATELET
Abs Immature Granulocytes: 0.07 10*3/uL (ref 0.00–0.07)
Basophils Absolute: 0 10*3/uL (ref 0.0–0.1)
Basophils Relative: 0 %
Eosinophils Absolute: 0.2 10*3/uL (ref 0.0–0.5)
Eosinophils Relative: 2 %
HCT: 37.7 % (ref 36.0–46.0)
Hemoglobin: 12 g/dL (ref 12.0–15.0)
Immature Granulocytes: 1 %
Lymphocytes Relative: 6 %
Lymphs Abs: 0.7 10*3/uL (ref 0.7–4.0)
MCH: 27.6 pg (ref 26.0–34.0)
MCHC: 31.8 g/dL (ref 30.0–36.0)
MCV: 86.9 fL (ref 80.0–100.0)
Monocytes Absolute: 0.7 10*3/uL (ref 0.1–1.0)
Monocytes Relative: 6 %
Neutro Abs: 10.4 10*3/uL — ABNORMAL HIGH (ref 1.7–7.7)
Neutrophils Relative %: 85 %
Platelets: 123 10*3/uL — ABNORMAL LOW (ref 150–400)
RBC: 4.34 MIL/uL (ref 3.87–5.11)
RDW: 15.5 % (ref 11.5–15.5)
WBC: 12.1 10*3/uL — ABNORMAL HIGH (ref 4.0–10.5)
nRBC: 0 % (ref 0.0–0.2)

## 2020-08-07 LAB — BLOOD GAS, VENOUS
Acid-Base Excess: 3.2 mmol/L — ABNORMAL HIGH (ref 0.0–2.0)
Bicarbonate: 27.9 mmol/L (ref 20.0–28.0)
O2 Saturation: 77.9 %
Patient temperature: 37
pCO2, Ven: 42 mmHg — ABNORMAL LOW (ref 44.0–60.0)
pH, Ven: 7.43 (ref 7.250–7.430)
pO2, Ven: 41 mmHg (ref 32.0–45.0)

## 2020-08-07 LAB — MAGNESIUM: Magnesium: 2 mg/dL (ref 1.7–2.4)

## 2020-08-07 MED ORDER — VITAL 1.5 CAL PO LIQD
1000.0000 mL | ORAL | Status: DC
  Administered 2020-08-07 – 2020-08-09 (×3): 1000 mL

## 2020-08-07 MED ORDER — PROSOURCE TF PO LIQD
90.0000 mL | Freq: Three times a day (TID) | ORAL | Status: DC
  Administered 2020-08-07 – 2020-08-09 (×7): 90 mL
  Filled 2020-08-07 (×11): qty 90

## 2020-08-07 NOTE — Progress Notes (Signed)
Patient HR continues to be elevated. Patient not sedated appropriately and breathing over the vent. Fentanyl and Versed restarted per order. NP at bedside. Will continue to monitor.

## 2020-08-07 NOTE — Progress Notes (Signed)
Patient in Afib RVR, HR up to 170's. PRN Metoprolol given per order. HR improved but continues to be 110-120's. Will continue to monitor.

## 2020-08-07 NOTE — Progress Notes (Signed)
Nutrition Follow-up  DOCUMENTATION CODES:   Obesity unspecified  INTERVENTION:  Initiate new goal TF regimen of Vital 1.5 Cal at 50 mL/hr (1200 mL goal daily volume) + PROSource 90 mL TID per tube. Goal regimen provides 2040 kcal, 147 grams of protein, 912 mL H2O daily.  NUTRITION DIAGNOSIS:   Inadequate oral intake related to inability to eat as evidenced by NPO status.  Ongoing.  GOAL:   Patient will meet greater than or equal to 90% of their needs  Met with TF regimen.  MONITOR:   Vent status, Labs, Weight trends, Skin, I & O's  REASON FOR ASSESSMENT:   Ventilator    ASSESSMENT:   64 year old female with PMHx of HTN, HLD who was admitted with COVID-19 PNA, also with A-fib with RVR and severe intertrigo.  9/26 transferred to ICU and intubated 9/28 OGT was removed and replaced with new OGT  Patient is currently intubated on ventilator support MV: 12.6 L/min Temp (24hrs), Avg:99.4 F (37.4 C), Min:96.3 F (35.7 C), Max:100.9 F (38.3 C)  Medications reviewed and include: vitamin C 500 mg daily, vitamin D3 2000 units daily, Colace 50 mg BID, Novolog 0-9 units Q4hrs, MVI daily, zinc sulfate 220 mg daily, Precedex gtt, fentanyl gtt, Versed gtt.  Labs reviewed: CBG 148-153, BUN 57, Creatinine 1.15, Phosphorus 2.4.  I/O: 2750 mL UOP yesterday (1.1 mL/kg/hr)  Weight trend: 108.4 kg on 10/3; -0.5 kg from 9/27  Enteral Access: 16 Fr. OGT placed 9/28; terminates in the stomach per abdominal x-ray 9/28  TF regimen: Vital High Protein at 50 mL/hr + PROSource TF 45 mL TID per tube  Discussed with RN and on rounds. Patient now off propofol gtt.  Diet Order:   Diet Order    None     EDUCATION NEEDS:   No education needs have been identified at this time  Skin:  Skin Assessment: Skin Integrity Issues: Skin Integrity Issues:: Other (Comment) Other: intertriginous dermatitis to B/L breasts, B/L groin, under pannus, umbilicus; non pressure wound to right lower  abdomen  Last BM:  08/06/2020 - medium type 7  Height:   Ht Readings from Last 1 Encounters:  08/05/20 '5\' 5"'  (1.651 m)   Weight:   Wt Readings from Last 1 Encounters:  08/06/20 108.4 kg   Ideal Body Weight:  56.8 kg  BMI:  Body mass index is 39.77 kg/m.  Estimated Nutritional Needs:   Kcal:  2037  Protein:  130-140 grams  Fluid:  >/= 2 L/day  Jacklynn Barnacle, MS, RD, LDN Pager number available on Amion

## 2020-08-07 NOTE — Progress Notes (Signed)
This RN took over care of patient at 15:00.

## 2020-08-07 NOTE — Progress Notes (Signed)
CRITICAL CARE PROGRESS NOTE    Name: DOROTEA HAND MRN: [id number] DOB: 07-Nov-1955     LOS: 53   SUBJECTIVE FINDINGS & SIGNIFICANT EVENTS    Patient description:  Reason for Consult: Severe respiratory distress and patient with COVID-19 pneumonia. Referring Physician: Florina Ou, MD  [name] is an 64 y.o. female.  HPI: Patient is a 64 year old female with history noted below, who was admitted to [facility] on 2020-07-24 brought in via EMS as a "code sepsis".  Patient subsequently tested positive for COVID-19.  She presented with oxygen saturations of 69% on room air and had to be placed on nonrebreather mask to achieve oxygen saturations in the 90s.  She was subsequently admitted to the Covid unit for management of COVID-19 pneumonia.  She was noted to have acute kidney injury she was treated with remdesivir, zinc, vitamin C and antitussives.  She was placed empirically on Rocephin and azithromycin.  She continued to have increased FiO2 requirements.  She was refusing to self prone.  She had atrial fibrillation and was seen by cardiology to assist in management.   07/31/20- patient is on 90% FiO2, will modify meds as patient is on metoprolol and levophed as well as cardizem.  Will d/c propofol to improve SBP and remove arythmogenic vasopressor if able.  Plan to have CVP trending and diuresis today.   Goals of care discussion today - patient made DNR per sister and son of patient. I had 2 separate phone meetings with son and separate conversation with sister today to explain patient is worse with shock and renal failure requiring multiple pressors.   08/01/20- patient remains critically ill. FiO2 has been weaned down to 80% today.  08/03/20- patient remains critically ill shes being proned, weaning FiO2 as able.   08/04/20- patient with no interval changes continue with weaning and proning protocol.  Continue weaning FiO2 and PEEP as to optimize for SBT 08/06/20- patient is weaned down 40%FiO2 today, will attempt awakening trial with SBT, I spoke to son [name] today, he appreciates update and does not want Elink video view at this time.  08/07/20- patient is requiring 50%FiO2 today    Lines/tubes : Airway 7.5 mm (Active)  Secured at (cm) 23 cm 07/31/20 0806  Measured From Lips 07/31/20 [place] 07/31/20 0806  Secured By [employer] 07/31/20 0806  Tube Holder Repositioned Yes 07/31/20 0806  Cuff Pressure (cm H2O) 26 cm H2O 07/31/20 0806  Site Condition Dry 07/31/20 0806     CVC Triple Lumen 07/30/20 Left Internal jugular (Active)  Indication for Insertion or Continuance of Line Vasoactive infusions 07/30/20 2010  Site Assessment Clean;Dry;Intact 07/30/20 2010  Proximal Lumen Status Infusing 07/30/20 2010  Medial Lumen Status Infusing 07/30/20 2010  Distal Lumen Status Infusing 07/30/20 2010  Dressing Type Transparent;Occlusive 07/30/20 2010  Dressing Status Clean;Dry;Intact 07/30/20 2010  Line Care Connections checked and tightened 07/30/20 2010  Dressing Change Due 08/06/20 07/30/20 2010     NG/OG Tube [facility] mouth Xray Documented cm marking at nare/ corner of mouth 75 cm (Active)  Cm Marking at Nare/Corner of Mouth (if applicable) 75 cm 44/31/54 2010  Site Assessment Clean;Dry;Intact 07/30/20 2010  Ongoing Placement Verification No change in cm markings or external length of tube from initial placement;No acute changes, not attributed to clinical condition;No change in respiratory status;Xray 07/30/20 2010  Status Suction-low intermittent 07/30/20 2010  Amount of suction 80 mmHg 07/30/20 2010  Drainage Appearance [employer] Shark;Coffee ground 07/30/20  2010     Urethral Catheter Olen Pel Double-lumen;Latex 16 Fr. (Active)  Indication for Insertion or  Continuance of Catheter Unstable critically ill patients first 24-48 hours (See Criteria) 07/31/20 0752  Site Assessment Clean;Intact 07/30/20 2000  Catheter Maintenance Bag below level of bladder;Catheter secured;Drainage bag/tubing not touching floor;Insertion date on drainage bag;No dependent loops;Seal intact 07/30/20 2000  Collection Container Standard drainage bag 07/30/20 2000  Securement Method Securing device (Describe) 07/30/20 2000  Urinary Catheter Interventions (if applicable) Unclamped 12/45/80 2000  Output (mL) 75 mL 07/31/20 0600    Microbiology/Sepsis markers: Results for orders placed or performed during the hospital encounter of 07/24/20  Urine culture     Status: Abnormal   Collection Time: 07/24/20  9:12 AM   Specimen: Urine, Random  Result Value Ref Range Status   Specimen Description   Final    URINE, RANDOM Performed at [facility], [address] [place], [place] [address]    Special Requests   Final    NONE Performed at [facility], [address], [place], [place] [address]    Culture (A)  Final    <10,000 COLONIES/mL >=100,000 COLONIES/mL Performed at [facility], [place] [address], [place], [place] [address]    Report Status 07/25/2020 FINAL  Final  Blood culture (routine single)     Status: None   Collection Time: 07/24/20  9:14 AM   Specimen: BLOOD LEFT ARM  Result Value Ref Range Status   Specimen Description BLOOD LEFT ARM  Final   Special Requests   Final    BOTTLES DRAWN AEROBIC AND ANAEROBIC Blood Culture adequate volume   Culture   Final    NO GROWTH 5 DAYS Performed at [facility], [address] [place], [place] [address]    Report Status 07/29/2020 FINAL  Final  SARS Coronavirus 2 by RT PCR (hospital order, performed in [place] hospital lab) Nasopharyngeal Nasopharyngeal Swab     Status: Abnormal   Collection Time: 07/24/20  9:16 AM   Specimen: Nasopharyngeal Swab  Result Value Ref Range  Status   SARS Coronavirus 2 POSITIVE (A) NEGATIVE Final    Comment: RESULT CALLED TO, READ BACK BY AND VERIFIED WITH:  SAMANTHA HAMILTON AT 7902 07/24/20 SDR (NOTE) SARS-CoV-2 target nucleic acids are DETECTED  SARS-CoV-2 RNA is generally detectable in upper respiratory specimens  during the acute phase of infection.  Positive results are indicative  of the presence of the identified virus, but do not rule out bacterial infection or co-infection with other pathogens not detected by the test.  Clinical correlation with patient history and  other diagnostic information is necessary to determine patient infection status.  The expected result is negative.  Fact Sheet for Patients:   StrictlyIdeas.no   Fact Sheet for Healthcare Providers:   BankingDealers.co.za    This test is not yet approved or cleared by the [place] FDA and  has been authorized for detection and/or diagnosis of SARS-CoV-2 by FDA under an Emergency Use Authorization (EUA).  This EUA will remain in effect (meaning thi s test can be used) for the duration of  the COVID-19 declaration under Section 564(b)(1) of the Act, 21 U.S.C. section 360-bbb-3(b)(1), unless the authorization is terminated or revoked sooner.  Performed at [facility], [address] [place],  [address]   MRSA PCR Screening     Status: None   Collection Time: 07/30/20 11:08 AM   Specimen: Nasopharyngeal  Result Value Ref Range Status  MRSA by PCR NEGATIVE NEGATIVE Final    Comment:        The GeneXpert MRSA Assay (FDA approved for NASAL specimens only), is one component of a comprehensive MRSA colonization surveillance program. It is not intended to diagnose MRSA infection nor to guide or monitor treatment for MRSA infections. Performed at [facility], [address] [place], [place] [address]   Culture, respiratory (non-expectorated)     Status: None    Collection Time: 08/01/20 10:46 AM   Specimen: Tracheal Aspirate; Respiratory  Result Value Ref Range Status   Specimen Description   Final    TRACHEAL ASPIRATE Performed at [facility], [address] [place], [place] [address]    Special Requests   Final    NONE Performed at [facility], [place], [place] [address]    Gram Stain NO WBC SEEN NO ORGANISMS SEEN   Final   Culture   Final    RARE Normal respiratory flora-no Staph aureus or Pseudomonas seen Performed at [facility] [address], [place],  [address]    Report Status 08/03/2020 FINAL  Final    Anti-infectives:  Anti-infectives (From admission, onward)   Start     Dose/Rate Route Frequency Ordered Stop   08/01/20 1200  ceFEPIme (MAXIPIME) 2 g in sodium chloride 0.9 % 100 mL IVPB  Status:  Discontinued        2 g 200 mL/hr over 30 Minutes Intravenous Every 12 hours 08/01/20 1026 08/03/20 1014   07/25/20 1000  azithromycin (ZITHROMAX) 500 mg in sodium chloride 0.9 % 250 mL IVPB  Status:  Discontinued        500 mg 250 mL/hr over 60 Minutes Intravenous Every 24 hours 07/24/20 1108 07/24/20 1317   07/25/20 1000  remdesivir 100 mg in sodium chloride 0.9 % 100 mL IVPB       "Followed by" Linked Group Details   100 mg 200 mL/hr over 30 Minutes Intravenous Daily 07/24/20 1112 07/28/20 1544   07/24/20 1300  fluconazole (DIFLUCAN) tablet 100 mg  Status:  Discontinued        100 mg Oral Daily 07/24/20 1251 07/25/20 1522   07/24/20 1200  remdesivir 200 mg in sodium chloride 0.9% 250 mL IVPB       "Followed by" Linked Group Details   200 mg 580 mL/hr over 30 Minutes Intravenous Once 07/24/20 1112 07/24/20 1424   07/24/20 1115  cefTRIAXone (ROCEPHIN) 2 g in sodium chloride 0.9 % 100 mL IVPB  Status:  Discontinued        2 g 200 mL/hr over 30 Minutes Intravenous Every 24 hours 07/24/20 1108 07/24/20 1113   07/24/20 1000  cefTRIAXone (ROCEPHIN) 2 g in sodium chloride 0.9 %  100 mL IVPB  Status:  Discontinued        2 g 200 mL/hr over 30 Minutes Intravenous Every 24 hours 07/24/20 0947 07/28/20 1438   07/24/20 1000  azithromycin (ZITHROMAX) 500 mg in sodium chloride 0.9 % 250 mL IVPB  Status:  Discontinued        500 mg 250 mL/hr over 60 Minutes Intravenous Every 24 hours 07/24/20 0947 07/28/20 1438       Consults: Treatment Team:  Yolonda Kida, MD Dionisio David, MD     PAST MEDICAL HISTORY   Past Medical History:  Diagnosis Date  . Allergy   . Elevated transaminase level   . Hyperlipidemia   . Hypertension   .  Obesity      SURGICAL HISTORY   Past Surgical History:  Procedure Laterality Date  . BREAST SURGERY    . ECTOPIC PREGNANCY SURGERY       FAMILY HISTORY   Family History  Problem Relation Age of Onset  . Hypertension Father   . Stroke Father   . Hypertension Mother   . Arthritis Mother   . Aneurysm Sister   . Hypertension Sister   . Hypertension Brother   . Hypertension Sister   . Hypertension Brother      SOCIAL HISTORY   Social History   Tobacco Use  . Smoking status: Former Research scientist (life sciences)  . Smokeless tobacco: Never Used  Substance Use Topics  . Alcohol use: Yes    Alcohol/week: 0.0 standard drinks    Comment: on occasion  . Drug use: No     MEDICATIONS   Current Medication:  Current Facility-Administered Medications:  .  acetaminophen (TYLENOL) tablet 650 mg, 650 mg, Oral, Q6H PRN, 650 mg at 08/01/20 2221 **OR** acetaminophen (TYLENOL) suppository 650 mg, 650 mg, Rectal, Q6H PRN, Agbata, Tochukwu, MD .  amiodarone (PACERONE) tablet 200 mg, 200 mg, Per Tube, BID, Lanney Gins, Fuad, MD, 200 mg at 08/07/20 0809 .  apixaban (ELIQUIS) tablet 5 mg, 5 mg, Per Tube, BID, Lanney Gins, Fuad, MD, 5 mg at 08/07/20 0809 .  ascorbic acid (VITAMIN C) tablet 500 mg, 500 mg, Per Tube, Daily, Aleskerov, Fuad, MD, 500 mg at 08/07/20 0809 .  aspirin chewable tablet 81 mg, 81 mg, Per Tube, Daily, Lanney Gins, Fuad, MD, 81 mg  at 08/07/20 0808 .  chlorhexidine gluconate (MEDLINE KIT) (PERIDEX) 0.12 % solution 15 mL, 15 mL, Mouth Rinse, BID, Akingbade, Jimi O, MD, 15 mL at 08/07/20 0811 .  Chlorhexidine Gluconate Cloth 2 % PADS 6 each, 6 each, Topical, Daily, Tyler Pita, MD, 6 each at 08/06/20 2212 .  cholecalciferol (VITAMIN D3) tablet 2,000 Units, 2,000 Units, Per Tube, Daily, Ottie Glazier, MD, 2,000 Units at 08/07/20 0809 .  dexmedetomidine (PRECEDEX) 400 MCG/100ML (4 mcg/mL) infusion, 0.4-1.2 mcg/kg/hr, Intravenous, Titrated, Aleskerov, Fuad, MD, Last Rate: 27.2 mL/hr at 08/07/20 0700, 1 mcg/kg/hr at 08/07/20 0700 .  docusate (COLACE) 50 MG/5ML liquid 50 mg, 50 mg, Per Tube, BID, Lanney Gins, Fuad, MD, 50 mg at 08/06/20 2211 .  feeding supplement (PROSource TF) liquid 45 mL, 45 mL, Per Tube, TID, Lanney Gins, Fuad, MD, 45 mL at 08/07/20 0811 .  feeding supplement (VITAL HIGH PROTEIN) liquid 1,000 mL, 1,000 mL, Per Tube, Continuous, Aleskerov, Fuad, MD, Last Rate: 50 mL/hr at 08/06/20 1453, 1,000 mL at 08/06/20 1453 .  fentaNYL 2542mg in NS 2574m(1068mml) infusion-PREMIX, 0-400 mcg/hr, Intravenous, Continuous, Hall, Scott A, RPH, Last Rate: 20 mL/hr at 08/07/20 0700, 200 mcg/hr at 08/07/20 0700 .  Gerhardt's butt cream, , Topical, TID, Agbata, Tochukwu, MD, Given at 08/07/20 0810 .  guaiFENesin-dextromethorphan (ROBITUSSIN DM) 100-10 MG/5ML syrup 10 mL, 10 mL, Oral, Q4H PRN, Agbata, Tochukwu, MD .  haloperidol lactate (HALDOL) injection 5 mg, 5 mg, Intravenous, Q6H PRN, PatPara SkeansD .  HYDROcodone-acetaminophen (NORCO/VICODIN) 5-325 MG per tablet 1 tablet, 1 tablet, Oral, Q6H PRN, OumLang SnowP, 1 tablet at 07/25/20 0143 .  insulin aspart (novoLOG) injection 0-9 Units, 0-9 Units, Subcutaneous, Q4H, BlaAwilda BillP, 1 Units at 08/07/20 0809 .  MEDLINE mouth rinse, 15 mL, Mouth Rinse, 10 times per day, AkiCheryll DessertD, 15 mL at 08/07/20 0510 .  metoprolol tartrate (LOPRESSOR)  injection 5 mg, 5  mg, Intravenous, Q2H PRN, Callwood, Dwayne D, MD, 5 mg at 08/06/20 2246 .  midazolam (VERSED) 50 mg in dextrose 5 % 50 mL (1 mg/mL) infusion, 0.5-10 mg/hr, Intravenous, Continuous, Shanlever, Pierce Crane, [facility], Last Rate: 4 mL/hr at 08/07/20 0807, 4 mg/hr at 08/07/20 0807 .  multivitamin with minerals tablet 1 tablet, 1 tablet, Per Tube, Daily, Ottie Glazier, MD, 1 tablet at 08/07/20 0809 .  norepinephrine (LEVOPHED) 16 mg in 272m premix infusion, 0-40 mcg/min, Intravenous, Titrated, AOttie Glazier MD, Stopped at 07/31/20 1806 .  nystatin (MYCOSTATIN/NYSTOP) topical powder, , Topical, TID, PDarliss Cheney MD, Given at 08/07/20 0810 .  ondansetron (ZOFRAN) tablet 4 mg, 4 mg, Oral, Q6H PRN **OR** ondansetron (ZOFRAN) injection 4 mg, 4 mg, Intravenous, Q6H PRN, Agbata, Tochukwu, MD .  pneumococcal 23 valent vaccine (PNEUMOVAX-23) injection 0.5 mL, 0.5 mL, Intramuscular, Tomorrow-1000, Pahwani, Ravi, MD .  propofol (DIPRIVAN) 1000 MG/100ML infusion, 5-80 mcg/kg/min, Intravenous, Titrated, GTyler Pita MD, Stopped at 08/06/20 0244 .  vasopressin (PITRESSIN) 20 Units in sodium chloride 0.9 % 100 mL infusion-*FOR SHOCK*, 0-0.03 Units/min, Intravenous, Continuous, AOttie Glazier MD, Stopped at 08/04/20 0147 .  vecuronium (NORCURON) injection 10 mg, 10 mg, Intravenous, Q1H PRN, KDarel HongD, NP, 10 mg at 08/04/20 0636 .  zinc sulfate capsule 220 mg, 220 mg, Per Tube, Daily, AOttie Glazier MD, 220 mg at 08/07/20 0809    ALLERGIES   Codeine    REVIEW OF SYSTEMS    Unable to perform due to Sedation on MV  PHYSICAL EXAMINATION   Vital Signs: Temp:  [96.3 F (35.7 C)-100.2 F (37.9 C)] 100.2 F (37.9 C) (10/04 0700) Pulse Rate:  [34-129] 84 (10/04 0700) Resp:  [22-36] 30 (10/04 0700) BP: (106-157)/(70-110) 141/95 (10/04 0700) SpO2:  [79 %-100 %] 95 % (10/04 0700) FiO2 (%):  [35 %-50 %] 50 % (10/04 0345)  GENERAL:intubated on MV age appropriate HEAD:  Normocephalic, atraumatic.  EYES: Pupils equal, round, reactive to light.  No scleral icterus.  MOUTH: Moist mucosal membrane. NECK: Supple. No thyromegaly. No nodules. No JVD.  PULMONARY: crackles at bases difficult to appreciate due to MV CARDIOVASCULAR: S1 and S2. Regular rate and rhythm. No murmurs, rubs, or gallops.  GASTROINTESTINAL: Soft, nontender, non-distended. No masses. Positive bowel sounds. No hepatosplenomegaly.  MUSCULOSKELETAL: No swelling, clubbing, or edema.  NEUROLOGIC: Mild distress due to acute illness SKIN:intact,warm,dry   PERTINENT DATA     Infusions: . dexmedetomidine (PRECEDEX) IV infusion 1 mcg/kg/hr (08/07/20 0700)  . feeding supplement (VITAL HIGH PROTEIN) 1,000 mL (08/06/20 1453)  . fentaNYL infusion INTRAVENOUS 200 mcg/hr (08/07/20 0700)  . midazolam 4 mg/hr (08/07/20 0807)  . norepinephrine (LEVOPHED) Adult infusion Stopped (07/31/20 1806)  . propofol (DIPRIVAN) infusion Stopped (08/06/20 0244)  . vasopressin Stopped (08/04/20 0147)   Scheduled Medications: . amiodarone  200 mg Per Tube BID  . apixaban  5 mg Per Tube BID  . vitamin C  500 mg Per Tube Daily  . aspirin  81 mg Per Tube Daily  . chlorhexidine gluconate (MEDLINE KIT)  15 mL Mouth Rinse BID  . Chlorhexidine Gluconate Cloth  6 each Topical Daily  . cholecalciferol  2,000 Units Per Tube Daily  . docusate  50 mg Per Tube BID  . feeding supplement (PROSource TF)  45 mL Per Tube TID  . Gerhardt's butt cream   Topical TID  . insulin aspart  0-9 Units Subcutaneous Q4H  . mouth rinse  15 mL Mouth Rinse 10 times per day  . multivitamin with  minerals  1 tablet Per Tube Daily  . nystatin   Topical TID  . pneumococcal 23 valent vaccine  0.5 mL Intramuscular Tomorrow-1000  . zinc sulfate  220 mg Per Tube Daily   PRN Medications: acetaminophen **OR** acetaminophen, guaiFENesin-dextromethorphan, haloperidol lactate, HYDROcodone-acetaminophen, metoprolol tartrate, ondansetron **OR** ondansetron  (ZOFRAN) IV, vecuronium Hemodynamic parameters:   Intake/Output: 10/03 0701 - 10/04 0700 In: 1016.1 [I.V.:1016.1] Out: 2750 [Urine:2750]  Ventilator  Settings: Vent Mode: PRVC FiO2 (%):  [35 %-50 %] 50 % Set Rate:  [30 bmp] 30 bmp Vt Set:  [430 mL] 430 mL PEEP:  [5 cmH20] 5 cmH20 Plateau Pressure:  [8 [id number] cmH20] 28 cmH20     LAB RESULTS:  Basic Metabolic Panel: Recent Labs  Lab 08/01/20 0500 08/01/20 0500 08/02/20 0430 08/02/20 0430 08/03/20 0430 08/03/20 0430 08/04/20 0400 08/04/20 0400 08/05/20 0430 08/06/20 0410 08/07/20 0500  NA 136   < > 136  --  139  --  141  --   --  144 144  K 5.1   < > 5.3*   < > 4.8   < > 4.3   < >  --  4.1 4.2  CL 101   < > 102  --  104  --  104  --   --  109 109  CO2 25   < > 26  --  26  --  27  --   --  30 27  GLUCOSE 160*   < > 165*  --  182*  --  181*  --   --  178* 155*  BUN 43*   < > 50*  --  56*  --  64*  --   --  55* 57*  CREATININE 2.27*   < > 2.07*  --  1.65*  --  1.42*  --   --  1.11* 1.15*  CALCIUM 9.4   < > 9.5  --  9.5  --  9.5  --   --  9.4 9.7  MG 2.1   < > 2.1   < > 2.1  --  2.0  --  2.0 2.0 2.0  PHOS 5.0*  --  4.4  --  3.6  --  3.0  --   --   --  2.4*   < > = values in this interval not displayed.   Liver Function Tests: Recent Labs  Lab 08/02/20 0430 08/07/20 0500  ALBUMIN 2.1* 2.1*   No results for input(s): LIPASE, AMYLASE in the last 168 hours. No results for input(s): AMMONIA in the last 168 hours. CBC: Recent Labs  Lab 08/02/20 0430 08/06/20 0410  WBC 14.0* 7.9  NEUTROABS 12.7* 6.5  HGB 12.2 12.0  HCT 37.9 36.5  MCV 86.3 84.3  PLT 107* 131*   Cardiac Enzymes: No results for input(s): CKTOTAL, CKMB, CKMBINDEX, TROPONINI in the last 168 hours. BNP: Invalid input(s): POCBNP CBG: Recent Labs  Lab 08/06/20 1517 08/06/20 2006 08/06/20 2305 08/07/20 0345 08/07/20 0730  GLUCAP 244* 229* 237* 153* 148*       IMAGING RESULTS:  Imaging: No results found.  ASSESSMENT AND PLAN     -Multidisciplinary rounds held today Acute respiratory failure with hypoxia due to COVID-19 ARDS COVID-19 pneumonia mechanical ventilation ARDS protocol, 6 cc/kg of PBW Recruitment maneuvers as necessary Prone positioning 18/6 ratio Completed remdesivir Continue Rocephin and azithromycin for now  Atrial fibrillation with RVR Due to physiologic stress/hypoxia Hx: Hyperthyroidism treated with RAI Continue amiodarone and calcium channel blocker  Cardiology following Continue cardiac monitoring Correct electrolytes as needed Heparin infusion  Severe intertrigo Multiple skin breakdown areas Wound care following  ID -continue IV abx as prescibed -follow up cultures  GI/Nutrition GI PROPHYLAXIS as indicated DIET-->TF's as tolerated Constipation protocol as indicated  ENDO - ICU hypoglycemic\Hyperglycemia protocol -check FSBS per protocol   ELECTROLYTES -follow labs as needed -replace as needed -pharmacy consultation   DVT/GI PRX ordered -SCDs  TRANSFUSIONS AS NEEDED MONITOR FSBS ASSESS the need for LABS as needed   Critical care provider statement:    Critical care time (minutes):  33   Critical care time was exclusive of:  Separately billable procedures and treating other patients   Critical care was necessary to treat or prevent imminent or life-threatening deterioration of the following conditions:  Acute hypoxemic respiratory failure, multiple comoribid conditions.    Critical care was time spent personally by me on the following activities:  Development of treatment plan with patient or surrogate, discussions with consultants, evaluation of patient's response to treatment, examination of patient, obtaining history from patient or surrogate, ordering and performing treatments and interventions, ordering and review of laboratory studies and re-evaluation of patient's condition.  I assumed direction of critical care for this patient from another provider in my  specialty: no    This document was prepared using Dragon voice recognition software and may include unintentional dictation errors.   Jorje Guild, M.D., M.P.H.  Pulmonary & Critical Care Medicine

## 2020-08-08 LAB — CBC WITH DIFFERENTIAL/PLATELET
Abs Immature Granulocytes: 0.03 10*3/uL (ref 0.00–0.07)
Basophils Absolute: 0 10*3/uL (ref 0.0–0.1)
Basophils Relative: 0 %
Eosinophils Absolute: 0 10*3/uL (ref 0.0–0.5)
Eosinophils Relative: 0 %
HCT: 34.7 % — ABNORMAL LOW (ref 36.0–46.0)
Hemoglobin: 11.3 g/dL — ABNORMAL LOW (ref 12.0–15.0)
Immature Granulocytes: 0 %
Lymphocytes Relative: 6 %
Lymphs Abs: 0.5 10*3/uL — ABNORMAL LOW (ref 0.7–4.0)
MCH: 27.8 pg (ref 26.0–34.0)
MCHC: 32.6 g/dL (ref 30.0–36.0)
MCV: 85.3 fL (ref 80.0–100.0)
Monocytes Absolute: 0.5 10*3/uL (ref 0.1–1.0)
Monocytes Relative: 7 %
Neutro Abs: 7 10*3/uL (ref 1.7–7.7)
Neutrophils Relative %: 87 %
Platelets: 123 10*3/uL — ABNORMAL LOW (ref 150–400)
RBC: 4.07 MIL/uL (ref 3.87–5.11)
RDW: 15.8 % — ABNORMAL HIGH (ref 11.5–15.5)
WBC: 8.1 10*3/uL (ref 4.0–10.5)
nRBC: 0 % (ref 0.0–0.2)

## 2020-08-08 LAB — COMPREHENSIVE METABOLIC PANEL
ALT: 102 U/L — ABNORMAL HIGH (ref 0–44)
AST: 47 U/L — ABNORMAL HIGH (ref 15–41)
Albumin: 2.1 g/dL — ABNORMAL LOW (ref 3.5–5.0)
Alkaline Phosphatase: 53 U/L (ref 38–126)
Anion gap: 6 (ref 5–15)
BUN: 55 mg/dL — ABNORMAL HIGH (ref 8–23)
CO2: 26 mmol/L (ref 22–32)
Calcium: 9.4 mg/dL (ref 8.9–10.3)
Chloride: 111 mmol/L (ref 98–111)
Creatinine, Ser: 1.22 mg/dL — ABNORMAL HIGH (ref 0.44–1.00)
GFR calc Af Amer: 54 mL/min — ABNORMAL LOW (ref >60)
GFR calc non Af Amer: 46 mL/min — ABNORMAL LOW (ref >60)
Glucose, Bld: 287 mg/dL — ABNORMAL HIGH (ref 70–99)
Potassium: 4.3 mmol/L (ref 3.5–5.1)
Sodium: 143 mmol/L (ref 135–145)
Total Bilirubin: 0.6 mg/dL (ref 0.3–1.2)
Total Protein: 5.9 g/dL — ABNORMAL LOW (ref 6.5–8.1)

## 2020-08-08 LAB — MAGNESIUM: Magnesium: 2 mg/dL (ref 1.7–2.4)

## 2020-08-08 LAB — GLUCOSE, CAPILLARY
Glucose-Capillary: 264 mg/dL — ABNORMAL HIGH (ref 70–99)
Glucose-Capillary: 257 mg/dL — ABNORMAL HIGH (ref 70–99)
Glucose-Capillary: 228 mg/dL — ABNORMAL HIGH (ref 70–99)
Glucose-Capillary: 248 mg/dL — ABNORMAL HIGH (ref 70–99)
Glucose-Capillary: 193 mg/dL — ABNORMAL HIGH (ref 70–99)
Glucose-Capillary: 178 mg/dL — ABNORMAL HIGH (ref 70–99)

## 2020-08-08 MED ORDER — INSULIN ASPART 100 UNIT/ML ~~LOC~~ SOLN
2.0000 [IU] | SUBCUTANEOUS | Status: DC
  Administered 2020-08-08 – 2020-08-10 (×10): 2 [IU] via SUBCUTANEOUS
  Filled 2020-08-08 (×11): qty 1

## 2020-08-08 MED ORDER — FAMOTIDINE IN NACL 20-0.9 MG/50ML-% IV SOLN
20.0000 mg | Freq: Two times a day (BID) | INTRAVENOUS | Status: DC
  Administered 2020-08-08 – 2020-08-25 (×35): 20 mg via INTRAVENOUS
  Filled 2020-08-08 (×35): qty 50

## 2020-08-08 MED ORDER — SODIUM CHLORIDE 0.9 % IV SOLN
INTRAVENOUS | Status: DC | PRN
  Administered 2020-08-08 – 2020-08-19 (×4): 250 mL via INTRAVENOUS
  Administered 2020-08-21 – 2020-08-22 (×2): 500 mL via INTRAVENOUS

## 2020-08-08 NOTE — Plan of Care (Signed)

## 2020-08-08 NOTE — Progress Notes (Signed)
CRITICAL CARE PROGRESS NOTE    Name: Courtney Anderson MRN: [id number] DOB: May 03, 1956     LOS: 74   SUBJECTIVE FINDINGS & SIGNIFICANT EVENTS    Patient description:  Reason for Consult: Severe respiratory distress and patient with COVID-19 pneumonia. Referring Physician: Florina Ou, MD  Courtney Anderson is an 64 y.o. female.  HPI: Patient is a 64 year old female with history noted below, who was admitted to [facility] on 2020-07-24 brought in via EMS as a "code sepsis".  Patient subsequently tested positive for COVID-19.  She presented with oxygen saturations of 69% on room air and had to be placed on nonrebreather mask to achieve oxygen saturations in the 90s.  She was subsequently admitted to the Covid unit for management of COVID-19 pneumonia.  She was noted to have acute kidney injury she was treated with remdesivir, zinc, vitamin C and antitussives.  She was placed empirically on Rocephin and azithromycin.  She continued to have increased FiO2 requirements.  She was refusing to self prone.  She had atrial fibrillation and was seen by cardiology to assist in management.   07/31/20- patient is on 90% FiO2, will modify meds as patient is on metoprolol and levophed as well as cardizem.  Will d/c propofol to improve SBP and remove arythmogenic vasopressor if able.  Plan to have CVP trending and diuresis today.   Goals of care discussion today - patient made DNR per sister and son of patient. I had 2 separate phone meetings with son and separate conversation with sister today to explain patient is worse with shock and renal failure requiring multiple pressors.   08/01/20- patient remains critically ill. FiO2 has been weaned down to 80% today.  08/03/20- patient remains critically ill shes being proned, weaning FiO2 as able.   08/04/20- patient with no interval changes continue with weaning and proning protocol.  Continue weaning FiO2 and PEEP as to optimize for SBT 08/06/20- patient is weaned down 40%FiO2 today, will attempt awakening trial with SBT, I spoke to son [name] today, he appreciates update and does not want Elink video view at this time.  08/07/20- patient is requiring 50%FiO2 today 08/08/20- Continuing to wean vent. FiO2 is 28%, will trial SBT today   Lines/tubes : Airway 7.5 mm (Active)  Secured at (cm) 23 cm 07/31/20 0806  Measured From Lips 07/31/20 [place] 07/31/20 0806  Secured By [employer] 07/31/20 0806  Tube Holder Repositioned Yes 07/31/20 0806  Cuff Pressure (cm H2O) 26 cm H2O 07/31/20 0806  Site Condition Dry 07/31/20 0806     CVC Triple Lumen 07/30/20 Left Internal jugular (Active)  Indication for Insertion or Continuance of Line Vasoactive infusions 07/30/20 2010  Site Assessment Clean;Dry;Intact 07/30/20 2010  Proximal Lumen Status Infusing 07/30/20 2010  Medial Lumen Status Infusing 07/30/20 2010  Distal Lumen Status Infusing 07/30/20 2010  Dressing Type Transparent;Occlusive 07/30/20 2010  Dressing Status Clean;Dry;Intact 07/30/20 2010  Line Care Connections checked and tightened 07/30/20 2010  Dressing Change Due 08/06/20 07/30/20 2010     NG/OG Tube [facility] mouth Xray Documented cm marking at nare/ corner of mouth 75 cm (Active)  Cm Marking at Nare/Corner of Mouth (if applicable) 75 cm 67/70/34 2010  Site Assessment Clean;Dry;Intact 07/30/20 2010  Ongoing Placement Verification No change in cm markings or external length of tube from initial placement;No acute changes, not attributed to clinical condition;No change in respiratory status;Xray 07/30/20 2010  Status Suction-low intermittent 07/30/20 2010  Amount of  suction 80 mmHg 07/30/20 2010  Drainage Appearance [employer] Shark;Coffee ground 07/30/20 2010     Urethral Catheter Olen Pel  Double-lumen;Latex 16 Fr. (Active)  Indication for Insertion or Continuance of Catheter Unstable critically ill patients first 24-48 hours (See Criteria) 07/31/20 0752  Site Assessment Clean;Intact 07/30/20 2000  Catheter Maintenance Bag below level of bladder;Catheter secured;Drainage bag/tubing not touching floor;Insertion date on drainage bag;No dependent loops;Seal intact 07/30/20 2000  Collection Container Standard drainage bag 07/30/20 2000  Securement Method Securing device (Describe) 07/30/20 2000  Urinary Catheter Interventions (if applicable) Unclamped 29/92/42 2000  Output (mL) 75 mL 07/31/20 0600    Microbiology/Sepsis markers: Results for orders placed or performed during the hospital encounter of 07/24/20  Urine culture     Status: Abnormal   Collection Time: 07/24/20  9:12 AM   Specimen: Urine, Random  Result Value Ref Range Status   Specimen Description   Final    URINE, RANDOM Performed at [facility], [address] [place], [place] [address]    Special Requests   Final    NONE Performed at [facility], [address], [place], [place] [address]    Culture (A)  Final    <10,000 COLONIES/mL >=100,000 COLONIES/mL Performed at [facility], [place] [address], [place], [place] [address]    Report Status 07/25/2020 FINAL  Final  Blood culture (routine single)     Status: None   Collection Time: 07/24/20  9:14 AM   Specimen: BLOOD LEFT ARM  Result Value Ref Range Status   Specimen Description BLOOD LEFT ARM  Final   Special Requests   Final    BOTTLES DRAWN AEROBIC AND ANAEROBIC Blood Culture adequate volume   Culture   Final    NO GROWTH 5 DAYS Performed at [facility], [address] [place], [place] [address]    Report Status 07/29/2020 FINAL  Final  SARS Coronavirus 2 by RT PCR (hospital order, performed in [place] hospital lab) Nasopharyngeal Nasopharyngeal Swab     Status: Abnormal   Collection Time: 07/24/20  9:16  AM   Specimen: Nasopharyngeal Swab  Result Value Ref Range Status   SARS Coronavirus 2 POSITIVE (A) NEGATIVE Final    Comment: RESULT CALLED TO, READ BACK BY AND VERIFIED WITH:  SAMANTHA HAMILTON AT 1740 07/24/20 SDR (NOTE) SARS-CoV-2 target nucleic acids are DETECTED  SARS-CoV-2 RNA is generally detectable in upper respiratory specimens  during the acute phase of infection.  Positive results are indicative  of the presence of the identified virus, but do not rule out bacterial infection or co-infection with other pathogens not detected by the test.  Clinical correlation with patient history and  other diagnostic information is necessary to determine patient infection status.  The expected result is negative.  Fact Sheet for Patients:   StrictlyIdeas.no   Fact Sheet for Healthcare Providers:   BankingDealers.co.za    This test is not yet approved or cleared by the [place] FDA and  has been authorized for detection and/or diagnosis of SARS-CoV-2 by FDA under an Emergency Use Authorization (EUA).  This EUA will remain in effect (meaning thi s test can be used) for the duration of  the COVID-19 declaration under Section 564(b)(1) of the Act, 21 U.S.C. section 360-bbb-3(b)(1), unless the authorization is terminated or revoked sooner.  Performed at [facility], [address] [place],  [address]   MRSA PCR Screening     Status: None   Collection Time: 07/30/20 11:08  AM   Specimen: Nasopharyngeal  Result Value Ref Range Status   MRSA by PCR NEGATIVE NEGATIVE Final    Comment:        The GeneXpert MRSA Assay (FDA approved for NASAL specimens only), is one component of a comprehensive MRSA colonization surveillance program. It is not intended to diagnose MRSA infection nor to guide or monitor treatment for MRSA infections. Performed at [facility], [address] [place], [place] [address]    Culture, respiratory (non-expectorated)     Status: None   Collection Time: 08/01/20 10:46 AM   Specimen: Tracheal Aspirate; Respiratory  Result Value Ref Range Status   Specimen Description   Final    TRACHEAL ASPIRATE Performed at [facility], [address], [place], [place] [address]    Special Requests   Final    NONE Performed at [facility], [place], [place] [address]    Gram Stain NO WBC SEEN NO ORGANISMS SEEN   Final   Culture   Final    RARE Normal respiratory flora-no Staph aureus or Pseudomonas seen Performed at [place] [address] [place], [place] [address]    Report Status 08/03/2020 FINAL  Final    Anti-infectives:  Anti-infectives (From admission, onward)   Start     Dose/Rate Route Frequency Ordered Stop   08/01/20 1200  ceFEPIme (MAXIPIME) 2 g in sodium chloride 0.9 % 100 mL IVPB  Status:  Discontinued        2 g 200 mL/hr over 30 Minutes Intravenous Every 12 hours 08/01/20 1026 08/03/20 1014   07/25/20 1000  azithromycin (ZITHROMAX) 500 mg in sodium chloride 0.9 % 250 mL IVPB  Status:  Discontinued        500 mg 250 mL/hr over 60 Minutes Intravenous Every 24 hours 07/24/20 1108 07/24/20 1317   07/25/20 1000  remdesivir 100 mg in sodium chloride 0.9 % 100 mL IVPB       "Followed by" Linked Group Details   100 mg 200 mL/hr over 30 Minutes Intravenous Daily 07/24/20 1112 07/28/20 1544   07/24/20 1300  fluconazole (DIFLUCAN) tablet 100 mg  Status:  Discontinued        100 mg Oral Daily 07/24/20 1251 07/25/20 1522   07/24/20 1200  remdesivir 200 mg in sodium chloride 0.9% 250 mL IVPB       "Followed by" Linked Group Details   200 mg 580 mL/hr over 30 Minutes Intravenous Once 07/24/20 1112 07/24/20 1424   07/24/20 1115  cefTRIAXone (ROCEPHIN) 2 g in sodium chloride 0.9 % 100 mL IVPB  Status:  Discontinued        2 g 200 mL/hr over 30 Minutes Intravenous Every 24 hours 07/24/20 1108 07/24/20 1113    07/24/20 1000  cefTRIAXone (ROCEPHIN) 2 g in sodium chloride 0.9 % 100 mL IVPB  Status:  Discontinued        2 g 200 mL/hr over 30 Minutes Intravenous Every 24 hours 07/24/20 0947 07/28/20 1438   07/24/20 1000  azithromycin (ZITHROMAX) 500 mg in sodium chloride 0.9 % 250 mL IVPB  Status:  Discontinued        500 mg 250 mL/hr over 60 Minutes Intravenous Every 24 hours 07/24/20 0947 07/28/20 1438       Consults: Treatment Team:  Yolonda Kida, MD Dionisio David, MD     PAST MEDICAL HISTORY   Past Medical History:  Diagnosis Date  . Allergy   .  Elevated transaminase level   . Hyperlipidemia   . Hypertension   . Obesity      SURGICAL HISTORY   Past Surgical History:  Procedure Laterality Date  . BREAST SURGERY    . ECTOPIC PREGNANCY SURGERY       FAMILY HISTORY   Family History  Problem Relation Age of Onset  . Hypertension Father   . Stroke Father   . Hypertension Mother   . Arthritis Mother   . Aneurysm Sister   . Hypertension Sister   . Hypertension Brother   . Hypertension Sister   . Hypertension Brother      SOCIAL HISTORY   Social History   Tobacco Use  . Smoking status: Former Research scientist (life sciences)  . Smokeless tobacco: Never Used  Substance Use Topics  . Alcohol use: Yes    Alcohol/week: 0.0 standard drinks    Comment: on occasion  . Drug use: No     MEDICATIONS   Current Medication:  Current Facility-Administered Medications:  .  acetaminophen (TYLENOL) tablet 650 mg, 650 mg, Oral, Q6H PRN, 650 mg at 08/07/20 1422 **OR** acetaminophen (TYLENOL) suppository 650 mg, 650 mg, Rectal, Q6H PRN, Agbata, Tochukwu, MD .  amiodarone (PACERONE) tablet 200 mg, 200 mg, Per Tube, BID, Aleskerov, Fuad, MD, 200 mg at 08/07/20 2115 .  apixaban (ELIQUIS) tablet 5 mg, 5 mg, Per Tube, BID, Lanney Gins, Fuad, MD, 5 mg at 08/07/20 2115 .  ascorbic acid (VITAMIN C) tablet 500 mg, 500 mg, Per Tube, Daily, Ottie Glazier, MD, 500 mg at 08/07/20 0809 .  aspirin chewable  tablet 81 mg, 81 mg, Per Tube, Daily, Lanney Gins, Fuad, MD, 81 mg at 08/07/20 0808 .  chlorhexidine gluconate (MEDLINE KIT) (PERIDEX) 0.12 % solution 15 mL, 15 mL, Mouth Rinse, BID, Akingbade, Jimi O, MD, 15 mL at 08/08/20 0806 .  Chlorhexidine Gluconate Cloth 2 % PADS 6 each, 6 each, Topical, Daily, Tyler Pita, MD, 6 each at 08/07/20 2053 .  cholecalciferol (VITAMIN D3) tablet 2,000 Units, 2,000 Units, Per Tube, Daily, Ottie Glazier, MD, 2,000 Units at 08/07/20 0809 .  dexmedetomidine (PRECEDEX) 400 MCG/100ML (4 mcg/mL) infusion, 0.4-1.2 mcg/kg/hr, Intravenous, Titrated, Aleskerov, Fuad, MD, Last Rate: 16.34 mL/hr at 08/08/20 0800, 0.6 mcg/kg/hr at 08/08/20 0800 .  docusate (COLACE) 50 MG/5ML liquid 50 mg, 50 mg, Per Tube, BID, Lanney Gins, Fuad, MD, 50 mg at 08/07/20 2115 .  feeding supplement (PROSource TF) liquid 90 mL, 90 mL, Per Tube, TID, Jorje Guild, MD, 90 mL at 08/07/20 2115 .  feeding supplement (VITAL 1.5 CAL) liquid 1,000 mL, 1,000 mL, Per Tube, Continuous, Jorje Guild, MD, Last Rate: 50 mL/hr at 08/07/20 1305, 1,000 mL at 08/07/20 1305 .  fentaNYL 2537mg in NS 2562m(1049mml) infusion-PREMIX, 0-400 mcg/hr, Intravenous, Continuous, Hall, Scott A, RPH, Last Rate: 10 mL/hr at 08/08/20 0800, 100 mcg/hr at 08/08/20 0800 .  Gerhardt's butt cream, , Topical, TID, Agbata, Tochukwu, MD, Given at 08/07/20 2116 .  guaiFENesin-dextromethorphan (ROBITUSSIN DM) 100-10 MG/5ML syrup 10 mL, 10 mL, Oral, Q4H PRN, Agbata, Tochukwu, MD .  haloperidol lactate (HALDOL) injection 5 mg, 5 mg, Intravenous, Q6H PRN, PatPara SkeansD .  HYDROcodone-acetaminophen (NORCO/VICODIN) 5-325 MG per tablet 1 tablet, 1 tablet, Oral, Q6H PRN, OumLang SnowP, 1 tablet at 07/25/20 0143 .  insulin aspart (novoLOG) injection 0-9 Units, 0-9 Units, Subcutaneous, Q4H, BlaAwilda BillP, 5 Units at 08/08/20 0805 .  MEDLINE mouth rinse, 15 mL, Mouth Rinse, 10 times per day, Akingbade, JimSamuella CotaD, 15  mL at 08/08/20 0634 .  metoprolol tartrate (LOPRESSOR) injection 5 mg, 5 mg, Intravenous, Q2H PRN, Callwood, Dwayne D, MD, 5 mg at 08/06/20 2246 .  midazolam (VERSED) 50 mg in dextrose 5 % 50 mL (1 mg/mL) infusion, 0.5-10 mg/hr, Intravenous, Continuous, Shanlever, Pierce Crane, RPH, Last Rate: 2 mL/hr at 08/08/20 0800, 2 mg/hr at 08/08/20 0800 .  multivitamin with minerals tablet 1 tablet, 1 tablet, Per Tube, Daily, Ottie Glazier, MD, 1 tablet at 08/07/20 0809 .  norepinephrine (LEVOPHED) 16 mg in 291m premix infusion, 0-40 mcg/min, Intravenous, Titrated, AOttie Glazier MD, Stopped at 07/31/20 1806 .  nystatin (MYCOSTATIN/NYSTOP) topical powder, , Topical, TID, PDarliss Cheney MD, Given at 08/07/20 2116 .  ondansetron (ZOFRAN) tablet 4 mg, 4 mg, Oral, Q6H PRN **OR** ondansetron (ZOFRAN) injection 4 mg, 4 mg, Intravenous, Q6H PRN, Agbata, Tochukwu, MD .  pneumococcal 23 valent vaccine (PNEUMOVAX-23) injection 0.5 mL, 0.5 mL, Intramuscular, Tomorrow-1000, Pahwani, Ravi, MD .  propofol (DIPRIVAN) 1000 MG/100ML infusion, 5-80 mcg/kg/min, Intravenous, Titrated, GTyler Pita MD, Stopped at 08/06/20 0244 .  vasopressin (PITRESSIN) 20 Units in sodium chloride 0.9 % 100 mL infusion-*FOR SHOCK*, 0-0.03 Units/min, Intravenous, Continuous, AOttie Glazier MD, Stopped at 08/04/20 0147 .  vecuronium (NORCURON) injection 10 mg, 10 mg, Intravenous, Q1H PRN, KDarel HongD, NP, 10 mg at 08/04/20 0636 .  zinc sulfate capsule 220 mg, 220 mg, Per Tube, Daily, AOttie Glazier MD, 220 mg at 08/07/20 0809    ALLERGIES   Codeine    REVIEW OF SYSTEMS    Unable to perform due to Sedation on MV  PHYSICAL EXAMINATION   Vital Signs: Temp:  [96.4 F (35.8 C)-101.3 F (38.5 C)] 99 F (37.2 C) (10/05 0800) Pulse Rate:  [42-109] 99 (10/05 0800) Resp:  [19-30] 28 (10/05 0800) BP: (94-155)/(61-102) 114/73 (10/05 0800) SpO2:  [90 %-100 %] 90 % (10/05 0800) FiO2 (%):  [28 %-40 %] 28 % (10/05  0742)  GENERAL:intubated on MV age appropriate HEAD: Normocephalic, atraumatic.  EYES: Pupils equal, round, reactive to light.  No scleral icterus.  MOUTH: Moist mucosal membrane. NECK: Supple. No thyromegaly. No nodules. No JVD.  PULMONARY: crackles at bases difficult to appreciate due to MV CARDIOVASCULAR: S1 and S2. Regular rate and rhythm. No murmurs, rubs, or gallops.  GASTROINTESTINAL: Soft, nontender, non-distended. No masses. Positive bowel sounds. No hepatosplenomegaly.  MUSCULOSKELETAL: No swelling, clubbing, or edema.  NEUROLOGIC: Mild distress due to acute illness SKIN:intact,warm,dry   PERTINENT DATA     Infusions: . dexmedetomidine (PRECEDEX) IV infusion 0.6 mcg/kg/hr (08/08/20 0800)  . feeding supplement (VITAL 1.5 CAL) 1,000 mL (08/07/20 1305)  . fentaNYL infusion INTRAVENOUS 100 mcg/hr (08/08/20 0800)  . midazolam 2 mg/hr (08/08/20 0800)  . norepinephrine (LEVOPHED) Adult infusion Stopped (07/31/20 1806)  . propofol (DIPRIVAN) infusion Stopped (08/06/20 0244)  . vasopressin Stopped (08/04/20 0147)   Scheduled Medications: . amiodarone  200 mg Per Tube BID  . apixaban  5 mg Per Tube BID  . vitamin C  500 mg Per Tube Daily  . aspirin  81 mg Per Tube Daily  . chlorhexidine gluconate (MEDLINE KIT)  15 mL Mouth Rinse BID  . Chlorhexidine Gluconate Cloth  6 each Topical Daily  . cholecalciferol  2,000 Units Per Tube Daily  . docusate  50 mg Per Tube BID  . feeding supplement (PROSource TF)  90 mL Per Tube TID  . Gerhardt's butt cream   Topical TID  . insulin aspart  0-9 Units Subcutaneous Q4H  . mouth rinse  15 mL Mouth Rinse 10 times per day  . multivitamin with minerals  1 tablet Per Tube Daily  . nystatin   Topical TID  . pneumococcal 23 valent vaccine  0.5 mL Intramuscular Tomorrow-1000  . zinc sulfate  220 mg Per Tube Daily   PRN Medications: acetaminophen **OR** acetaminophen, guaiFENesin-dextromethorphan, haloperidol lactate, HYDROcodone-acetaminophen,  metoprolol tartrate, ondansetron **OR** ondansetron (ZOFRAN) IV, vecuronium Hemodynamic parameters:   Intake/Output: 10/04 0701 - 10/05 0700 In: 3487.4 [I.V.:1037.4; NG/GT:2450] Out: 4098 [Urine:2240]  Ventilator  Settings: Vent Mode: PRVC FiO2 (%):  [28 %-40 %] 28 % Set Rate:  [30 bmp] 30 bmp Vt Set:  [430 mL] 430 mL PEEP:  [5 cmH20] 5 cmH20     LAB RESULTS:  Basic Metabolic Panel: Recent Labs  Lab 08/02/20 0430 08/02/20 0430 08/03/20 0430 08/03/20 0430 08/04/20 0400 08/04/20 0400 08/05/20 0430 08/06/20 0410 08/06/20 0410 08/07/20 0500 08/08/20 0440  NA 136   < > 139  --  141  --   --  144  --  144 143  K 5.3*   < > 4.8   < > 4.3   < >  --  4.1   < > 4.2 4.3  CL 102   < > 104  --  104  --   --  109  --  109 111  CO2 26   < > 26  --  27  --   --  30  --  27 26  GLUCOSE 165*   < > 182*  --  181*  --   --  178*  --  155* 287*  BUN 50*   < > 56*  --  64*  --   --  55*  --  57* 55*  CREATININE 2.07*   < > 1.65*  --  1.42*  --   --  1.11*  --  1.15* 1.22*  CALCIUM 9.5   < > 9.5  --  9.5  --   --  9.4  --  9.7 9.4  MG 2.1   < > 2.1   < > 2.0  --  2.0 2.0  --  2.0 2.0  PHOS 4.4  --  3.6  --  3.0  --   --   --   --  2.4*  --    < > = values in this interval not displayed.   Liver Function Tests: Recent Labs  Lab 08/02/20 0430 08/07/20 0500 08/08/20 0440  AST  --   --  47*  ALT  --   --  102*  ALKPHOS  --   --  53  BILITOT  --   --  0.6  PROT  --   --  5.9*  ALBUMIN 2.1* 2.1* 2.1*   No results for input(s): LIPASE, AMYLASE in the last 168 hours. No results for input(s): AMMONIA in the last 168 hours. CBC: Recent Labs  Lab 08/02/20 0430 08/06/20 0410 08/07/20 1106 08/08/20 0440  WBC 14.0* 7.9 12.1* 8.1  NEUTROABS 12.7* 6.5 10.4* 7.0  HGB 12.2 12.0 12.0 11.3*  HCT 37.9 36.5 37.7 34.7*  MCV 86.3 84.3 86.9 85.3  PLT 107* 131* 123* 123*   Cardiac Enzymes: No results for input(s): CKTOTAL, CKMB, CKMBINDEX, TROPONINI in the last 168 hours. BNP: Invalid  input(s): POCBNP CBG: Recent Labs  Lab 08/07/20 1542 08/07/20 2024 08/07/20 2332 08/08/20 0357 08/08/20 0739  GLUCAP 265* 275* 226* 264* 257*       IMAGING RESULTS:  Imaging: DG Chest Port 1 View  Result Date: 08/07/2020 CLINICAL DATA:  COVID-19 pneumonia. EXAM: PORTABLE CHEST 1 VIEW COMPARISON:  Abdominal x-ray dated August 01, 2020. Chest x-ray dated July 31, 2020. FINDINGS: Unchanged endotracheal and enteric tubes. Unchanged left internal jugular central venous catheter. Stable cardiomediastinal silhouette. Poor inspiratory effort compared to the prior study. Peripheral and basilar predominant patchy airspace disease in the right greater than left lungs is not significantly changed. No pneumothorax or pleural effusion. No acute osseous abnormality. IMPRESSION: 1. Unchanged multifocal pneumonia. Electronically Signed   By: Titus Dubin M.D.   On: 08/07/2020 10:00    ASSESSMENT AND PLAN    -Multidisciplinary rounds held today Acute respiratory failure with hypoxia due to COVID-19 ARDS COVID-19 pneumonia mechanical ventilation ARDS protocol, 6 cc/kg of PBW Recruitment maneuvers as necessary Prone positioning 18/6 ratio Completed remdesivir Completed Rocephin and azithromycin  Atrial fibrillation with RVR Due to physiologic stress/hypoxia Hx: Hyperthyroidism treated with RAI Continue amiodarone and calcium channel blocker Cardiology following Continue cardiac monitoring Correct electrolytes as needed Heparin infusion  Severe intertrigo Multiple skin breakdown areas Wound care following  ID -follow up cultures  GI/Nutrition GI PROPHYLAXIS as indicated DIET-->TF's as tolerated Constipation protocol as indicated  ENDO - ICU hypoglycemic\Hyperglycemia protocol -check FSBS per protocol   ELECTROLYTES -follow labs as needed -replace as needed -pharmacy consultation   DVT/GI PRX ordered -SCDs  TRANSFUSIONS AS NEEDED MONITOR FSBS ASSESS the  need for LABS as needed   Critical care provider statement:    Critical care time (minutes):  33   Critical care time was exclusive of:  Separately billable procedures and treating other patients   Critical care was necessary to treat or prevent imminent or life-threatening deterioration of the following conditions:  Acute hypoxemic respiratory failure, multiple comoribid conditions.    Critical care was time spent personally by me on the following activities:  Development of treatment plan with patient or surrogate, discussions with consultants, evaluation of patient's response to treatment, examination of patient, obtaining history from patient or surrogate, ordering and performing treatments and interventions, ordering and review of laboratory studies and re-evaluation of patient's condition.  I assumed direction of critical care for this patient from another provider in my specialty: no    This document was prepared using Dragon voice recognition software and may include unintentional dictation errors.   Jorje Guild, M.D., M.P.H.  Pulmonary & Critical Care Medicine

## 2020-08-08 NOTE — Progress Notes (Signed)
Inpatient Diabetes Program Recommendations  AACE/ADA: New Consensus Statement on Inpatient Glycemic Control   Target Ranges:  Prepandial:   less than 140 mg/dL      Peak postprandial:   less than 180 mg/dL (1-2 hours)      Critically ill patients:  140 - 180 mg/dL   Results for ELLAINA, SCHULER (MRN [id number]) as of 08/08/2020 09:37  Ref. Range 08/07/2020 07:30 08/07/2020 11:13 08/07/2020 15:42 08/07/2020 20:24 08/07/2020 23:32 08/08/2020 03:57 08/08/2020 07:39  Glucose-Capillary Latest Ref Range: 70 - 99 mg/dL 148 (H) 200 (H) 265 (H) 275 (H) 226 (H) 264 (H) 257 (H)   Review of Glycemic Control  Current orders for Inpatient glycemic control: Novolog 0-9 units Q4H; Vital @50  ml/hr  Inpatient Diabetes Program Recommendations:    Insulin: Please consider ordering Novolog 2 units Q4H for tube feeding coverage. If tube feeding is stopped or held then Novolog tube feeding coverage should also be stopped or held.  Thanks, Barnie Alderman, RN, MSN, CDE Diabetes Coordinator Inpatient Diabetes Program [phone] (Team Pager from 8am to 5pm)

## 2020-08-08 NOTE — Progress Notes (Signed)
Shift summary:  - Patient remains intubated and sedated.  - Weaning sedation as tolerated.

## 2020-08-09 DIAGNOSIS — J80 Acute respiratory distress syndrome: Secondary | ICD-10-CM | POA: Diagnosis not present

## 2020-08-09 DIAGNOSIS — U071 COVID-19: Secondary | ICD-10-CM | POA: Diagnosis not present

## 2020-08-09 DIAGNOSIS — I4891 Unspecified atrial fibrillation: Secondary | ICD-10-CM | POA: Diagnosis not present

## 2020-08-09 LAB — COMPREHENSIVE METABOLIC PANEL
ALT: 110 U/L — ABNORMAL HIGH (ref 0–44)
AST: 51 U/L — ABNORMAL HIGH (ref 15–41)
Albumin: 2.3 g/dL — ABNORMAL LOW (ref 3.5–5.0)
Alkaline Phosphatase: 52 U/L (ref 38–126)
Anion gap: 8 (ref 5–15)
BUN: 55 mg/dL — ABNORMAL HIGH (ref 8–23)
CO2: 26 mmol/L (ref 22–32)
Calcium: 9.8 mg/dL (ref 8.9–10.3)
Chloride: 114 mmol/L — ABNORMAL HIGH (ref 98–111)
Creatinine, Ser: 0.96 mg/dL (ref 0.44–1.00)
GFR calc non Af Amer: >60 mL/min (ref >60)
Glucose, Bld: 198 mg/dL — ABNORMAL HIGH (ref 70–99)
Potassium: 4.1 mmol/L (ref 3.5–5.1)
Sodium: 148 mmol/L — ABNORMAL HIGH (ref 135–145)
Total Bilirubin: 0.7 mg/dL (ref 0.3–1.2)
Total Protein: 6 g/dL — ABNORMAL LOW (ref 6.5–8.1)

## 2020-08-09 LAB — GLUCOSE, CAPILLARY
Glucose-Capillary: 118 mg/dL — ABNORMAL HIGH (ref 70–99)
Glucose-Capillary: 119 mg/dL — ABNORMAL HIGH (ref 70–99)
Glucose-Capillary: 127 mg/dL — ABNORMAL HIGH (ref 70–99)
Glucose-Capillary: 174 mg/dL — ABNORMAL HIGH (ref 70–99)
Glucose-Capillary: 178 mg/dL — ABNORMAL HIGH (ref 70–99)
Glucose-Capillary: 184 mg/dL — ABNORMAL HIGH (ref 70–99)

## 2020-08-09 LAB — BLOOD GAS, VENOUS
Acid-Base Excess: 1.2 mmol/L (ref 0.0–2.0)
Bicarbonate: 26 mmol/L (ref 20.0–28.0)
FIO2: 0.35
MECHVT: 430 mL
O2 Saturation: 66.2 %
PEEP: 5 cmH2O
Patient temperature: 37
RATE: 30 resp/min
pCO2, Ven: 41 mmHg — ABNORMAL LOW (ref 44.0–60.0)
pH, Ven: 7.41 (ref 7.250–7.430)
pO2, Ven: 34 mmHg (ref 32.0–45.0)

## 2020-08-09 LAB — CBC WITH DIFFERENTIAL/PLATELET
Abs Immature Granulocytes: 0.04 10*3/uL (ref 0.00–0.07)
Basophils Absolute: 0 10*3/uL (ref 0.0–0.1)
Basophils Relative: 0 %
Eosinophils Absolute: 0.4 10*3/uL (ref 0.0–0.5)
Eosinophils Relative: 4 %
HCT: 36.5 % (ref 36.0–46.0)
Hemoglobin: 11.3 g/dL — ABNORMAL LOW (ref 12.0–15.0)
Immature Granulocytes: 0 %
Lymphocytes Relative: 8 %
Lymphs Abs: 0.7 10*3/uL (ref 0.7–4.0)
MCH: 27.4 pg (ref 26.0–34.0)
MCHC: 31 g/dL (ref 30.0–36.0)
MCV: 88.6 fL (ref 80.0–100.0)
Monocytes Absolute: 0.6 10*3/uL (ref 0.1–1.0)
Monocytes Relative: 6 %
Neutro Abs: 7.7 10*3/uL (ref 1.7–7.7)
Neutrophils Relative %: 82 %
Platelets: 136 10*3/uL — ABNORMAL LOW (ref 150–400)
RBC: 4.12 MIL/uL (ref 3.87–5.11)
RDW: 15.9 % — ABNORMAL HIGH (ref 11.5–15.5)
WBC: 9.5 10*3/uL (ref 4.0–10.5)
nRBC: 0 % (ref 0.0–0.2)

## 2020-08-09 LAB — PHOSPHORUS: Phosphorus: 2.4 mg/dL — ABNORMAL LOW (ref 2.5–4.6)

## 2020-08-09 LAB — MAGNESIUM: Magnesium: 1.9 mg/dL (ref 1.7–2.4)

## 2020-08-09 MED ORDER — FUROSEMIDE 10 MG/ML IJ SOLN
INTRAMUSCULAR | Status: AC
  Filled 2020-08-09: qty 4

## 2020-08-09 MED ORDER — FUROSEMIDE 10 MG/ML IJ SOLN
40.0000 mg | Freq: Once | INTRAMUSCULAR | Status: AC
  Administered 2020-08-09: 40 mg via INTRAVENOUS

## 2020-08-09 MED ORDER — MAGNESIUM SULFATE 2 GM/50ML IV SOLN
2.0000 g | Freq: Once | INTRAVENOUS | Status: AC
  Administered 2020-08-09: 2 g via INTRAVENOUS
  Filled 2020-08-09: qty 50

## 2020-08-09 MED ORDER — ALBUMIN HUMAN 25 % IV SOLN
25.0000 g | Freq: Once | INTRAVENOUS | Status: AC
  Administered 2020-08-09: 25 g via INTRAVENOUS
  Filled 2020-08-09: qty 100

## 2020-08-09 MED ORDER — POTASSIUM & SODIUM PHOSPHATES 280-160-250 MG PO PACK
1.0000 | PACK | Freq: Once | ORAL | Status: AC
  Administered 2020-08-09: 1
  Filled 2020-08-09: qty 1

## 2020-08-09 MED ORDER — FREE WATER
200.0000 mL | Freq: Four times a day (QID) | Status: DC
  Administered 2020-08-09 – 2020-08-10 (×4): 200 mL

## 2020-08-09 NOTE — Progress Notes (Signed)
Notified Dr. Duwayne Heck of patients heart rate 150's afib, blood pressure 203/177. Patient is diaphoretic with labored breathing. Asked MD if I could restart versed and fentanyl. At this time MD will be by to assess patient and no new orders and hold on starting any additional sedation.

## 2020-08-09 NOTE — Progress Notes (Signed)
CRITICAL CARE PROGRESS NOTE    Name: Courtney Anderson MRN: [id number] DOB: 07-16-1956     LOS: 9   SUBJECTIVE FINDINGS & SIGNIFICANT EVENTS    Patient description:  Reason for Consult: Severe respiratory distress and patient with COVID-19 pneumonia. Referring Physician: Florina Ou, MD  Courtney Anderson is an 64 y.o. female.  HPI: Patient is a 64 year old female with history noted below, who was admitted to [facility] on 2020-07-24 brought in via EMS as a "code sepsis".  Patient subsequently tested positive for COVID-19.  She presented with oxygen saturations of 69% on room air and had to be placed on nonrebreather mask to achieve oxygen saturations in the 90s.  She was subsequently admitted to the Covid unit for management of COVID-19 pneumonia.  She was noted to have acute kidney injury she was treated with remdesivir, zinc, vitamin C and antitussives.  She was placed empirically on Rocephin and azithromycin.  She continued to have increased FiO2 requirements.  She was refusing to self prone.  She had atrial fibrillation and was seen by cardiology to assist in management.   07/31/20- patient is on 90% FiO2, will modify meds as patient is on metoprolol and levophed as well as cardizem.  Will d/c propofol to improve SBP and remove arythmogenic vasopressor if able.  Plan to have CVP trending and diuresis today.   Goals of care discussion today - patient made DNR per sister and son of patient. I had 2 separate phone meetings with son and separate conversation with sister today to explain patient is worse with shock and renal failure requiring multiple pressors.   08/01/20- patient remains critically ill. FiO2 has been weaned down to 80% today.  08/03/20- patient remains critically ill shes being proned, weaning FiO2 as able.   08/04/20- patient with no interval changes continue with weaning and proning protocol.  Continue weaning FiO2 and PEEP as to optimize for SBT 08/06/20- patient is weaned down 40%FiO2 today, will attempt awakening trial with SBT, I spoke to son [name] today, he appreciates update and does not want Elink video view at this time.  08/07/20- patient is requiring 50%FiO2 today 08/08/20- Continuing to wean vent. FiO2 is 28%, will trial SBT today 08/09/20-tachypneic and hypertensive with SBT.  Asynchronous.FiO2 back up to 50%   Lines/tubes : Airway 7.5 mm (Active)  Secured at (cm) 23 cm 07/31/20 0806  Measured From Lips 07/31/20 [place] 07/31/20 0806  Secured By [employer] 07/31/20 0806  Tube Holder Repositioned Yes 07/31/20 0806  Cuff Pressure (cm H2O) 26 cm H2O 07/31/20 0806  Site Condition Dry 07/31/20 0806     CVC Triple Lumen 07/30/20 Left Internal jugular (Active)  Indication for Insertion or Continuance of Line Vasoactive infusions 07/30/20 2010  Site Assessment Clean;Dry;Intact 07/30/20 2010  Proximal Lumen Status Infusing 07/30/20 2010  Medial Lumen Status Infusing 07/30/20 2010  Distal Lumen Status Infusing 07/30/20 2010  Dressing Type Transparent;Occlusive 07/30/20 2010  Dressing Status Clean;Dry;Intact 07/30/20 2010  Line Care Connections checked and tightened 07/30/20 2010  Dressing Change Due 08/06/20 07/30/20 2010     NG/OG Tube [facility] mouth Xray Documented cm marking at nare/ corner of mouth 75 cm (Active)  Cm Marking at Nare/Corner of Mouth (if applicable) 75 cm 99/37/16 2010  Site Assessment Clean;Dry;Intact 07/30/20 2010  Ongoing Placement Verification No change in cm markings or external length of tube from initial placement;No acute changes, not attributed to clinical condition;No change in respiratory status;Xray  07/30/20 2010  Status Suction-low intermittent 07/30/20 2010  Amount of suction 80 mmHg 07/30/20 2010  Drainage  Appearance [employer] Shark;Coffee ground 07/30/20 2010     Urethral Catheter Olen Pel Double-lumen;Latex 16 Fr. (Active)  Indication for Insertion or Continuance of Catheter Unstable critically ill patients first 24-48 hours (See Criteria) 07/31/20 0752  Site Assessment Clean;Intact 07/30/20 2000  Catheter Maintenance Bag below level of bladder;Catheter secured;Drainage bag/tubing not touching floor;Insertion date on drainage bag;No dependent loops;Seal intact 07/30/20 2000  Collection Container Standard drainage bag 07/30/20 2000  Securement Method Securing device (Describe) 07/30/20 2000  Urinary Catheter Interventions (if applicable) Unclamped 93/90/30 2000  Output (mL) 75 mL 07/31/20 0600    Microbiology/Sepsis markers: Results for orders placed or performed during the hospital encounter of 07/24/20  Urine culture     Status: Abnormal   Collection Time: 07/24/20  9:12 AM   Specimen: Urine, Random  Result Value Ref Range Status   Specimen Description   Final    URINE, RANDOM Performed at [facility], [address], [place], [place] [address]    Special Requests   Final    NONE Performed at [facility], [address], [place], [place] [address]    Culture (A)  Final    <10,000 COLONIES/mL >=100,000 COLONIES/mL Performed at [facility], [place] [address] [place], [place] [address]    Report Status 07/25/2020 FINAL  Final  Blood culture (routine single)     Status: None   Collection Time: 07/24/20  9:14 AM   Specimen: BLOOD LEFT ARM  Result Value Ref Range Status   Specimen Description BLOOD LEFT ARM  Final   Special Requests   Final    BOTTLES DRAWN AEROBIC AND ANAEROBIC Blood Culture adequate volume   Culture   Final    NO GROWTH 5 DAYS Performed at [facility], [address] [place], [place] [address]    Report Status 07/29/2020 FINAL  Final  SARS Coronavirus 2 by RT PCR (hospital order, performed in [facility] hospital lab)  Nasopharyngeal Nasopharyngeal Swab     Status: Abnormal   Collection Time: 07/24/20  9:16 AM   Specimen: Nasopharyngeal Swab  Result Value Ref Range Status   SARS Coronavirus 2 POSITIVE (A) NEGATIVE Final    Comment: RESULT CALLED TO, READ BACK BY AND VERIFIED WITH:  SAMANTHA HAMILTON AT 5638 07/24/20 SDR (NOTE) SARS-CoV-2 target nucleic acids are DETECTED  SARS-CoV-2 RNA is generally detectable in upper respiratory specimens  during the acute phase of infection.  Positive results are indicative  of the presence of the identified virus, but do not rule out bacterial infection or co-infection with other pathogens not detected by the test.  Clinical correlation with patient history and  other diagnostic information is necessary to determine patient infection status.  The expected result is negative.  Fact Sheet for Patients:   StrictlyIdeas.no   Fact Sheet for Healthcare Providers:   BankingDealers.co.za    This test is not yet approved or cleared by the [place] FDA and  has been authorized for detection and/or diagnosis of SARS-CoV-2 by FDA under an Emergency Use Authorization (EUA).  This EUA will remain in effect (meaning thi s test can be used) for the duration of  the COVID-19 declaration under Section 564(b)(1) of the Act, 21 U.S.C. section 360-bbb-3(b)(1), unless the authorization is terminated or revoked sooner.  Performed at [facility], [address], [place], [place] [address]   MRSA PCR Screening  Status: None   Collection Time: 07/30/20 11:08 AM   Specimen: Nasopharyngeal  Result Value Ref Range Status   MRSA by PCR NEGATIVE NEGATIVE Final    Comment:        The GeneXpert MRSA Assay (FDA approved for NASAL specimens only), is one component of a comprehensive MRSA colonization surveillance program. It is not intended to diagnose MRSA infection nor to guide or monitor treatment for MRSA  infections. Performed at [facility], [address] [place], [place] [address]   Culture, respiratory (non-expectorated)     Status: None   Collection Time: 08/01/20 10:46 AM   Specimen: Tracheal Aspirate; Respiratory  Result Value Ref Range Status   Specimen Description   Final    TRACHEAL ASPIRATE Performed at [facility], [address] [place], [place] [address]    Special Requests   Final    NONE Performed at [facility], [place], [place] [address]    Gram Stain NO WBC SEEN NO ORGANISMS SEEN   Final   Culture   Final    RARE Normal respiratory flora-no Staph aureus or Pseudomonas seen Performed at [place] [address], [place], [place] [address]    Report Status 08/03/2020 FINAL  Final    Anti-infectives:  Anti-infectives (From admission, onward)   Start     Dose/Rate Route Frequency Ordered Stop   08/01/20 1200  ceFEPIme (MAXIPIME) 2 g in sodium chloride 0.9 % 100 mL IVPB  Status:  Discontinued        2 g 200 mL/hr over 30 Minutes Intravenous Every 12 hours 08/01/20 1026 08/03/20 1014   07/25/20 1000  azithromycin (ZITHROMAX) 500 mg in sodium chloride 0.9 % 250 mL IVPB  Status:  Discontinued        500 mg 250 mL/hr over 60 Minutes Intravenous Every 24 hours 07/24/20 1108 07/24/20 1317   07/25/20 1000  remdesivir 100 mg in sodium chloride 0.9 % 100 mL IVPB       "Followed by" Linked Group Details   100 mg 200 mL/hr over 30 Minutes Intravenous Daily 07/24/20 1112 07/28/20 1544   07/24/20 1300  fluconazole (DIFLUCAN) tablet 100 mg  Status:  Discontinued        100 mg Oral Daily 07/24/20 1251 07/25/20 1522   07/24/20 1200  remdesivir 200 mg in sodium chloride 0.9% 250 mL IVPB       "Followed by" Linked Group Details   200 mg 580 mL/hr over 30 Minutes Intravenous Once 07/24/20 1112 07/24/20 1424   07/24/20 1115  cefTRIAXone (ROCEPHIN) 2 g in sodium chloride 0.9 % 100 mL IVPB  Status:  Discontinued        2  g 200 mL/hr over 30 Minutes Intravenous Every 24 hours 07/24/20 1108 07/24/20 1113   07/24/20 1000  cefTRIAXone (ROCEPHIN) 2 g in sodium chloride 0.9 % 100 mL IVPB  Status:  Discontinued        2 g 200 mL/hr over 30 Minutes Intravenous Every 24 hours 07/24/20 0947 07/28/20 1438   07/24/20 1000  azithromycin (ZITHROMAX) 500 mg in sodium chloride 0.9 % 250 mL IVPB  Status:  Discontinued        500 mg 250 mL/hr over 60 Minutes Intravenous Every 24 hours 07/24/20 0947 07/28/20 1438       Consults: Treatment Team:  Yolonda Kida, MD Dionisio David, MD      MEDICATIONS   Scheduled Meds:  amiodarone  200 mg Per Tube BID   apixaban  5 mg Per Tube BID   vitamin C  500 mg Per Tube Daily   aspirin  81 mg Per Tube Daily   chlorhexidine gluconate (MEDLINE KIT)  15 mL Mouth Rinse BID   Chlorhexidine Gluconate Cloth  6 each Topical Daily   cholecalciferol  2,000 Units Per Tube Daily   docusate  50 mg Per Tube BID   feeding supplement (PROSource TF)  90 mL Per Tube TID   free water  200 mL Per Tube Q6H   Gerhardt's butt cream   Topical TID   insulin aspart  0-9 Units Subcutaneous Q4H   insulin aspart  2 Units Subcutaneous Q4H   mouth rinse  15 mL Mouth Rinse 10 times per day   multivitamin with minerals  1 tablet Per Tube Daily   nystatin   Topical TID   pneumococcal 23 valent vaccine  0.5 mL Intramuscular Tomorrow-1000   potassium & sodium phosphates  1 packet Per Tube Once   zinc sulfate  220 mg Per Tube Daily   Continuous Infusions:  sodium chloride 5 mL/hr at 08/09/20 0600   dexmedetomidine (PRECEDEX) IV infusion 1.2 mcg/kg/hr (08/09/20 1104)   famotidine (PEPCID) IV 20 mg (08/09/20 0732)   feeding supplement (VITAL 1.5 CAL) 50 mL/hr at 08/08/20 2000   fentaNYL infusion INTRAVENOUS 150 mcg/hr (08/09/20 1105)   magnesium sulfate bolus IVPB     midazolam Stopped (08/08/20 0932)   norepinephrine (LEVOPHED) Adult infusion Stopped (07/31/20 1806)    propofol (DIPRIVAN) infusion Stopped (08/06/20 0244)   vasopressin Stopped (08/04/20 0147)   PRN Meds:.sodium chloride, acetaminophen **OR** acetaminophen, guaiFENesin-dextromethorphan, haloperidol lactate, HYDROcodone-acetaminophen, metoprolol tartrate, ondansetron **OR** ondansetron (ZOFRAN) IV, vecuronium  ALLERGIES   Codeine  REVIEW OF SYSTEMS   Unable to perform due to Sedation on MV  PHYSICAL EXAMINATION   Vital Signs: Temp:  [97.4 F (36.3 C)-99.7 F (37.6 C)] 99.1 F (37.3 C) (10/06 1100) Pulse Rate:  [47-135] 100 (10/06 1100) Resp:  [16-36] 28 (10/06 0738) BP: (83-163)/(60-113) 103/73 (10/06 1100) SpO2:  [87 %-99 %] 99 % (10/06 1100) FiO2 (%):  [35 %-50 %] 50 % (10/06 0839)    Physical examination is limited due to need for PPE/CAPR GENERAL:intubated on MV age appropriate HEAD: Normocephalic, atraumatic.  EYES: Pupils equal, round, reactive to light.  No scleral icterus.  MOUTH: Moist mucosal membrane. NECK: Supple. No thyromegaly. No nodules. No JVD.  PULMONARY: crackles at bases difficult to appreciate due to MV CARDIOVASCULAR: Monitor ranging from sinus tach to normal sinus rhythm GASTROINTESTINAL: Soft,non-distended. Positive bowel sounds. No hepatosplenomegaly.  MUSCULOSKELETAL: No swelling, clubbing, or edema.  NEUROLOGIC: Sedated on vent in no further assessment could be made SKIN:intact,warm,dry   Medications   Infusions:  sodium chloride 5 mL/hr at 08/09/20 0600   dexmedetomidine (PRECEDEX) IV infusion 1.2 mcg/kg/hr (08/09/20 1104)   famotidine (PEPCID) IV 20 mg (08/09/20 0732)   feeding supplement (VITAL 1.5 CAL) 50 mL/hr at 08/08/20 2000   fentaNYL infusion INTRAVENOUS 150 mcg/hr (08/09/20 1105)   magnesium sulfate bolus IVPB     midazolam Stopped (08/08/20 0932)   norepinephrine (LEVOPHED) Adult infusion Stopped (07/31/20 1806)   propofol (DIPRIVAN) infusion Stopped (08/06/20 0244)   vasopressin Stopped (08/04/20 0147)   Scheduled  Medications:  amiodarone  200 mg Per Tube BID   apixaban  5 mg Per Tube BID   vitamin C  500 mg Per Tube Daily   aspirin  81 mg Per Tube Daily  chlorhexidine gluconate (MEDLINE KIT)  15 mL Mouth Rinse BID   Chlorhexidine Gluconate Cloth  6 each Topical Daily   cholecalciferol  2,000 Units Per Tube Daily   docusate  50 mg Per Tube BID   feeding supplement (PROSource TF)  90 mL Per Tube TID   free water  200 mL Per Tube Q6H   Gerhardt's butt cream   Topical TID   insulin aspart  0-9 Units Subcutaneous Q4H   insulin aspart  2 Units Subcutaneous Q4H   mouth rinse  15 mL Mouth Rinse 10 times per day   multivitamin with minerals  1 tablet Per Tube Daily   nystatin   Topical TID   pneumococcal 23 valent vaccine  0.5 mL Intramuscular Tomorrow-1000   potassium & sodium phosphates  1 packet Per Tube Once   zinc sulfate  220 mg Per Tube Daily   PRN Medications: sodium chloride, acetaminophen **OR** acetaminophen, guaiFENesin-dextromethorphan, haloperidol lactate, HYDROcodone-acetaminophen, metoprolol tartrate, ondansetron **OR** ondansetron (ZOFRAN) IV, vecuronium Hemodynamic parameters:   Intake/Output: 10/05 0701 - 10/06 0700 In: 1998.3 [I.V.:798.3; NG/GT:940; IV Piggyback:99.9] Out: 1950 [Urine:1950]  Ventilator  Settings: Vent Mode: PRVC FiO2 (%):  [35 %-50 %] 50 % Set Rate:  [30 bmp] 30 bmp Vt Set:  [430 mL] 430 mL PEEP:  [5 cmH20] 5 cmH20 Plateau Pressure:  [17 [id number] cmH20] 38 cmH20     LAB RESULTS:  Basic Metabolic Panel: Recent Labs  Lab 08/03/20 0430 08/03/20 0430 08/04/20 0400 08/04/20 0400 08/05/20 0430 08/06/20 0410 08/06/20 0410 08/07/20 0500 08/07/20 0500 08/08/20 0440 08/09/20 0411  NA 139   < > 141  --   --  144  --  144  --  143 148*  K 4.8   < > 4.3   < >  --  4.1   < > 4.2   < > 4.3 4.1  CL 104   < > 104  --   --  109  --  109  --  111 114*  CO2 26   < > 27  --   --  30  --  27  --  26 26  GLUCOSE 182*   < > 181*  --   --   178*  --  155*  --  287* 198*  BUN 56*   < > 64*  --   --  55*  --  57*  --  55* 55*  CREATININE 1.65*   < > 1.42*  --   --  1.11*  --  1.15*  --  1.22* 0.96  CALCIUM 9.5   < > 9.5  --   --  9.4  --  9.7  --  9.4 9.8  MG 2.1   < > 2.0   < > 2.0 2.0  --  2.0  --  2.0 1.9  PHOS 3.6  --  3.0  --   --   --   --  2.4*  --   --  2.4*   < > = values in this interval not displayed.   Liver Function Tests: Recent Labs  Lab 08/07/20 0500 08/08/20 0440 08/09/20 0411  AST  --  47* 51*  ALT  --  102* 110*  ALKPHOS  --  53 52  BILITOT  --  0.6 0.7  PROT  --  5.9* 6.0*  ALBUMIN 2.1* 2.1* 2.3*   No results for input(s): LIPASE, AMYLASE in the last 168 hours. No results for input(s): AMMONIA  in the last 168 hours. CBC: Recent Labs  Lab 08/06/20 0410 08/07/20 1106 08/08/20 0440 08/09/20 0411  WBC 7.9 12.1* 8.1 9.5  NEUTROABS 6.5 10.4* 7.0 7.7  HGB 12.0 12.0 11.3* 11.3*  HCT 36.5 37.7 34.7* 36.5  MCV 84.3 86.9 85.3 88.6  PLT 131* 123* 123* 136*   Cardiac Enzymes: No results for input(s): CKTOTAL, CKMB, CKMBINDEX, TROPONINI in the last 168 hours. BNP: Invalid input(s): POCBNP CBG: Recent Labs  Lab 08/08/20 1924 08/08/20 2323 08/09/20 0415 08/09/20 0726 08/09/20 1058  GLUCAP 193* 178* 174* 119* 127*       IMAGING RESULTS:  Imaging: No results found.  ASSESSMENT AND PLAN   Acute respiratory failure with hypoxia due to COVID-19 ARDS COVID-19 pneumonia mechanical ventilation ARDS protocol, 6 cc/kg of PBW Recruitment maneuvers as necessary Prone positioning 18/6 ratio Completed remdesivir Completed Rocephin and azithromycin  Atrial fibrillation with RVR Due to physiologic stress/hypoxia Hx: Hyperthyroidism treated with RAI Continue amiodarone and calcium channel blocker Cardiology following Continue cardiac monitoring Correct electrolytes as needed Heparin infusion PRN beta blocker  Severe intertrigo Multiple skin breakdown areas Wound care  following  ID No evidence of active bacterial infection  GI/Nutrition GI PROPHYLAXIS as indicated DIET-->TF's as tolerated Constipation protocol as indicated  ENDO - ICU hypoglycemic\Hyperglycemia protocol -check FSBS per protocol   ELECTROLYTES -follow labs as needed -replace as needed -pharmacy consultation   DVT/GI PRX ordered -SCDs  TRANSFUSIONS AS NEEDED MONITOR FSBS ASSESS the need for LABS as needed  Multidisciplinary rounds were performed with ICU team.  Have asked Palliative Care to resume goals of care conversations with patient's son.  Total critical care time 35 minutes  C. Derrill Kay, MD [facility]   *This note was dictated using voice recognition software/Dragon.  Despite best efforts to proofread, errors can occur which can change the meaning.  Any change was purely unintentional.

## 2020-08-09 NOTE — Progress Notes (Signed)
[place] for Electrolyte Monitoring and Replacement   Recent Labs: Potassium (mmol/L)  Date Value  08/09/2020 4.1   Magnesium (mg/dL)  Date Value  08/09/2020 1.9   Calcium (mg/dL)  Date Value  08/09/2020 9.8   Calcium, Total (PTH) (mg/dL)  Date Value  07/24/2020 10.1   Albumin (g/dL)  Date Value  08/09/2020 2.3 (L)  10/22/2019 4.2   Phosphorus (mg/dL)  Date Value  08/09/2020 2.4 (L)   Sodium (mmol/L)  Date Value  08/09/2020 148 (H)  10/22/2019 139    Assessment: 64 year old female with PMHx of HTN, HLD who was admitted with COVID-19 PNA, also with A-fib with RVR and severe intertrigo.  free water flushes 200 mL every 6 hours  Goal of Therapy:  Potassium 4.0 - 5.1 mmol/L Magnesium 2.0 - 2.4 mg/dL All Other Electrolytes WNL  Plan:   Phos-Nak 1 packet per tube x 1  2 grams IV magnesium sulfate x 1  F/U electrolytes in am  Dallie Piles ,PharmD Clinical Pharmacist 08/09/2020 1:37 PM

## 2020-08-09 NOTE — Progress Notes (Signed)
     Chart reviewed and updates received from RN and Dr. Patsey Berthold regarding  Courtney Anderson. Patient remains in critical condition with no signs of meaningful recovery or improvement. I have attempted on 4 occassions today to contact son, and unable to reach. He does not have a voicemail set up on his listed phone number. Katharine Look, RN updated.   PMT will re-attempt to contact family at a later time/date. Detailed note and recommendations to follow once ongoing [place] has been completed.    Alda Lea, AGPCNP-BC Palliative Medicine Team  Phone: [phone]  NO CHARGE

## 2020-08-10 ENCOUNTER — Inpatient Hospital Stay: Payer: Medicare Other

## 2020-08-10 DIAGNOSIS — U071 COVID-19: Secondary | ICD-10-CM | POA: Diagnosis not present

## 2020-08-10 DIAGNOSIS — L98499 Non-pressure chronic ulcer of skin of other sites with unspecified severity: Secondary | ICD-10-CM

## 2020-08-10 DIAGNOSIS — I4891 Unspecified atrial fibrillation: Secondary | ICD-10-CM | POA: Diagnosis not present

## 2020-08-10 DIAGNOSIS — J9601 Acute respiratory failure with hypoxia: Secondary | ICD-10-CM | POA: Diagnosis not present

## 2020-08-10 DIAGNOSIS — R000 Tachycardia, unspecified: Secondary | ICD-10-CM

## 2020-08-10 DIAGNOSIS — N179 Acute kidney failure, unspecified: Secondary | ICD-10-CM | POA: Diagnosis not present

## 2020-08-10 LAB — COMPREHENSIVE METABOLIC PANEL
ALT: 83 U/L — ABNORMAL HIGH (ref 0–44)
AST: 37 U/L (ref 15–41)
Albumin: 2.3 g/dL — ABNORMAL LOW (ref 3.5–5.0)
Alkaline Phosphatase: 42 U/L (ref 38–126)
Anion gap: 6 (ref 5–15)
BUN: 53 mg/dL — ABNORMAL HIGH (ref 8–23)
CO2: 28 mmol/L (ref 22–32)
Calcium: 9.5 mg/dL (ref 8.9–10.3)
Chloride: 116 mmol/L — ABNORMAL HIGH (ref 98–111)
Creatinine, Ser: 1.18 mg/dL — ABNORMAL HIGH (ref 0.44–1.00)
GFR calc non Af Amer: 48 mL/min — ABNORMAL LOW (ref >60)
Glucose, Bld: 152 mg/dL — ABNORMAL HIGH (ref 70–99)
Potassium: 4.1 mmol/L (ref 3.5–5.1)
Sodium: 150 mmol/L — ABNORMAL HIGH (ref 135–145)
Total Bilirubin: 0.6 mg/dL (ref 0.3–1.2)
Total Protein: 5.5 g/dL — ABNORMAL LOW (ref 6.5–8.1)

## 2020-08-10 LAB — BLOOD GAS, VENOUS
Acid-Base Excess: 4.4 mmol/L — ABNORMAL HIGH (ref 0.0–2.0)
Bicarbonate: 29.8 mmol/L — ABNORMAL HIGH (ref 20.0–28.0)
FIO2: 0.5
MECHVT: 430 mL
O2 Saturation: 72.8 %
PEEP: 5 cmH2O
Patient temperature: 37
RATE: 30 resp/min
pCO2, Ven: 47 mmHg (ref 44.0–60.0)
pH, Ven: 7.41 (ref 7.250–7.430)
pO2, Ven: 38 mmHg (ref 32.0–45.0)

## 2020-08-10 LAB — PHOSPHORUS: Phosphorus: 3 mg/dL (ref 2.5–4.6)

## 2020-08-10 LAB — CBC WITH DIFFERENTIAL/PLATELET
Abs Immature Granulocytes: 0.02 10*3/uL (ref 0.00–0.07)
Basophils Absolute: 0 10*3/uL (ref 0.0–0.1)
Basophils Relative: 0 %
Eosinophils Absolute: 0.4 10*3/uL (ref 0.0–0.5)
Eosinophils Relative: 6 %
HCT: 32.9 % — ABNORMAL LOW (ref 36.0–46.0)
Hemoglobin: 10.6 g/dL — ABNORMAL LOW (ref 12.0–15.0)
Immature Granulocytes: 0 %
Lymphocytes Relative: 11 %
Lymphs Abs: 0.8 10*3/uL (ref 0.7–4.0)
MCH: 28.2 pg (ref 26.0–34.0)
MCHC: 32.2 g/dL (ref 30.0–36.0)
MCV: 87.5 fL (ref 80.0–100.0)
Monocytes Absolute: 0.4 10*3/uL (ref 0.1–1.0)
Monocytes Relative: 6 %
Neutro Abs: 5.3 10*3/uL (ref 1.7–7.7)
Neutrophils Relative %: 77 %
Platelets: 119 10*3/uL — ABNORMAL LOW (ref 150–400)
RBC: 3.76 MIL/uL — ABNORMAL LOW (ref 3.87–5.11)
RDW: 16.2 % — ABNORMAL HIGH (ref 11.5–15.5)
WBC: 7 10*3/uL (ref 4.0–10.5)
nRBC: 0 % (ref 0.0–0.2)

## 2020-08-10 LAB — GLUCOSE, CAPILLARY
Glucose-Capillary: 147 mg/dL — ABNORMAL HIGH (ref 70–99)
Glucose-Capillary: 150 mg/dL — ABNORMAL HIGH (ref 70–99)
Glucose-Capillary: 153 mg/dL — ABNORMAL HIGH (ref 70–99)
Glucose-Capillary: 131 mg/dL — ABNORMAL HIGH (ref 70–99)
Glucose-Capillary: 105 mg/dL — ABNORMAL HIGH (ref 70–99)

## 2020-08-10 LAB — MAGNESIUM: Magnesium: 2.2 mg/dL (ref 1.7–2.4)

## 2020-08-10 MED ORDER — AMIODARONE IV BOLUS ONLY 150 MG/100ML
INTRAVENOUS | Status: AC
  Administered 2020-08-10: 150 mg via INTRAVENOUS
  Filled 2020-08-10: qty 100

## 2020-08-10 MED ORDER — CHLORHEXIDINE GLUCONATE 0.12 % MT SOLN
15.0000 mL | Freq: Two times a day (BID) | OROMUCOSAL | Status: DC
  Administered 2020-08-11: 15 mL via OROMUCOSAL

## 2020-08-10 MED ORDER — AMIODARONE IV BOLUS ONLY 150 MG/100ML: 150.0000 mg | Freq: Once | INTRAVENOUS | Status: AC

## 2020-08-10 MED ORDER — FLUCONAZOLE IN SODIUM CHLORIDE 200-0.9 MG/100ML-% IV SOLN
200.0000 mg | INTRAVENOUS | Status: AC
  Administered 2020-08-10 – 2020-08-17 (×8): 200 mg via INTRAVENOUS
  Filled 2020-08-10 (×8): qty 100

## 2020-08-10 MED ORDER — ORAL CARE MOUTH RINSE
15.0000 mL | Freq: Two times a day (BID) | OROMUCOSAL | Status: DC
  Administered 2020-08-10 – 2020-08-11 (×2): 15 mL via OROMUCOSAL

## 2020-08-10 MED ORDER — FREE WATER
200.0000 mL | Status: DC
  Administered 2020-08-10: 200 mL

## 2020-08-10 MED ORDER — METOPROLOL TARTRATE 5 MG/5ML IV SOLN
2.5000 mg | INTRAVENOUS | Status: DC | PRN
  Administered 2020-08-10 – 2020-08-11 (×2): 2.5 mg via INTRAVENOUS
  Administered 2020-08-11 (×3): 5 mg via INTRAVENOUS
  Administered 2020-08-12: 2.5 mg via INTRAVENOUS
  Administered 2020-08-14: 5 mg via INTRAVENOUS
  Filled 2020-08-10 (×6): qty 5

## 2020-08-10 MED ORDER — SODIUM CHLORIDE 0.9 % IV SOLN
0.0000 ug/h | INTRAVENOUS | Status: DC
  Administered 2020-08-10: 150 ug/h via INTRAVENOUS
  Filled 2020-08-10 (×2): qty 50

## 2020-08-10 MED ORDER — AMIODARONE HCL IN DEXTROSE 360-4.14 MG/200ML-% IV SOLN
60.0000 mg/h | INTRAVENOUS | Status: AC
  Administered 2020-08-10: 60 mg/h via INTRAVENOUS
  Filled 2020-08-10: qty 200

## 2020-08-10 MED ORDER — AMIODARONE HCL IN DEXTROSE 360-4.14 MG/200ML-% IV SOLN
60.0000 mg/h | INTRAVENOUS | Status: DC
  Administered 2020-08-11 (×2): 60 mg/h via INTRAVENOUS
  Administered 2020-08-11: 30 mg/h via INTRAVENOUS
  Administered 2020-08-12 – 2020-08-17 (×24): 60 mg/h via INTRAVENOUS
  Filled 2020-08-10 (×5): qty 200
  Filled 2020-08-10: qty 400
  Filled 2020-08-10 (×20): qty 200

## 2020-08-10 NOTE — Consult Note (Signed)
WOC Nurse Consult Note: Reason for Consult:Asked to re-evaluate intertriginous skin breakdown to inframammary, abdominal pannus, inguinal and perineal skin.  Full thickness injury with rubrous, weeping breakdown Wound type: moisture associated skin breakdown to skin folds.  Present on admission and continues to worsen. No systemic antifungal was implemented  I have sent secure chat to medical team and Dr Mortimer Fries will start systemic antifungals.  Will continue same skin fold management linen with silver and moisture wicking properties.  Patient is being turned and repositioned and prompt incontinence care.  Pressure Injury POA: NA Measurement:generalized erythema and breakdown beneath breasts and abdominal folds.  Wound PIR:JJOACZYS and moist Drainage (amount, consistency, odor) minimal serous weeping Periwound:blanchable erythema Dressing procedure/placement/frequency: continue interdry Ag MD to implement systemic antifungal for overgrowth.  Will not follow at this time.  Please re-consult if needed.  Domenic Moras MSN, RN, FNP-BC CWON Wound, Ostomy, Continence Nurse Pager [phone]

## 2020-08-10 NOTE — Progress Notes (Addendum)
Shift summary:  - Weaning sedation as tolerated. - SBT this AM. - Extubated to BiPAP at 1115 hrs; sedation off. - 20 mL of IV Versed, and 250 mL of IV Fentanyl wasted by this RN in sink; witnessed by Lavenia Atlas, RN.

## 2020-08-10 NOTE — Plan of Care (Signed)

## 2020-08-10 NOTE — Progress Notes (Signed)
[place] for Electrolyte Monitoring and Replacement   Recent Labs: Potassium (mmol/L)  Date Value  08/10/2020 4.1   Magnesium (mg/dL)  Date Value  08/10/2020 2.2   Calcium (mg/dL)  Date Value  08/10/2020 9.5   Calcium, Total (PTH) (mg/dL)  Date Value  07/24/2020 10.1   Albumin (g/dL)  Date Value  08/10/2020 2.3 (L)  10/22/2019 4.2   Phosphorus (mg/dL)  Date Value  08/10/2020 3.0   Sodium (mmol/L)  Date Value  08/10/2020 150 (H)  10/22/2019 139    Assessment: 64 year old female with PMHx of HTN, HLD who was admitted with COVID-19 PNA, also with A-fib with RVR and severe intertrigo.  free water flushes 200 mL every 4 hours  Goal of Therapy:  Potassium 4.0 - 5.1 mmol/L Magnesium 2.0 - 2.4 mg/dL All Other Electrolytes WNL  Plan:   No electrolyte replacement warranted today  F/U electrolytes in am  Dallie Piles ,PharmD Clinical Pharmacist 08/10/2020 8:02 AM

## 2020-08-10 NOTE — Progress Notes (Signed)
Pt was suctioned for a small amount of thick tan secretions. Per Dr. Zoila Shutter order, she was extubated to bipap. She is without stridor.

## 2020-08-10 NOTE — Progress Notes (Signed)
CRITICAL CARE NOTE Reason for Consult:Severe respiratory distress and patient with COVID-19 pneumonia. Referring Physician:Ekta Posey Pronto, MD  Courtney Anderson an 64 y.o.female.  [id number] is a 64 year old female with history noted below, who was admitted to [facility] on 2020-07-24 brought in via EMS as a "code sepsis". Patient subsequently tested positive for COVID-19. She presented with oxygen saturations of 69% on room air and had to be placed on nonrebreather mask to achieve oxygen saturations in the 90s. She was subsequently admitted to the Covid unit for management of COVID-19 pneumonia. She was noted to have acute kidney injury she was treated with remdesivir, zinc, vitamin C and antitussives. She was placed empirically on Rocephin and azithromycin. She continued to have increased FiO2 requirements. She was refusing to self prone. She had atrial fibrillation and was seen by cardiologyto assist in management.   07/31/20- patient is on 90% FiO2, will modify meds as patient is on metoprolol and levophed as well as cardizem.  Will d/c propofol to improve SBP and remove arythmogenic vasopressor if able.  Plan to have CVP trending and diuresis today.   Goals of care discussion today - patient made DNR per sister and son of patient. I had 2 separate phone meetings with son and separate conversation with sister today to explain patient is worse with shock and renal failure requiring multiple pressors.   08/01/20- patient remains critically ill. FiO2 has been weaned down to 80% today.  08/03/20- patient remains critically ill shes being proned, weaning FiO2 as able.  08/04/20- patient with no interval changes continue with weaning and proning protocol.  Continue weaning FiO2 and PEEP as to optimize for SBT 08/06/20- patient is weaned down 40%FiO2 today, will attempt awakening trial with SBT, I spoke to son [name] today, he appreciates update and does not want Elink video view at this time.   08/07/20- patient is requiring 50%FiO2 today 08/08/20- Continuing to wean vent. FiO2 is 28%, will trial SBT today 08/09/20-tachypneic and hypertensive with SBT.  Asynchronous.FiO2 back up to 50% 10/7 severe resp failure, attempt SAT/SBT    CC  follow up respiratory failure  SUBJECTIVE Patient remains critically ill Prognosis is guarded  Vent Mode: PRVC FiO2 (%):  [35 %-50 %] 50 % Set Rate:  [30 bmp] 30 bmp Vt Set:  [430 mL] 430 mL PEEP:  [5 cmH20] 5 cmH20 Plateau Pressure:  [21 cmH20-38 cmH20] 22 cmH20  BMP Latest Ref Rng & Units 08/10/2020 08/09/2020 08/08/2020  Glucose 70 - 99 mg/dL 152(H) 198(H) 287(H)  BUN 8 - 23 mg/dL 53(H) 55(H) 55(H)  Creatinine 0.44 - 1.00 mg/dL 1.18(H) 0.96 1.22(H)  BUN/Creat Ratio 12 - 28 - - -  Sodium 135 - 145 mmol/L 150(H) 148(H) 143  Potassium 3.5 - 5.1 mmol/L 4.1 4.1 4.3  Chloride 98 - 111 mmol/L 116(H) 114(H) 111  CO2 22 - 32 mmol/L 28 26 26   Calcium 8.9 - 10.3 mg/dL 9.5 9.8 9.4      BP 101/65   Pulse 85   Temp 98.9 F (37.2 C) (Oral)   Resp (!) 30   Ht 5\' 5"  (1.651 m)   Wt 108.4 kg   SpO2 100%   BMI 39.77 kg/m    I/O last 3 completed shifts: In: 4505.4 [I.V.:1628.4; Other:160; NG/GT:2600; IV Piggyback:117] Out: 3165 [Urine:3165] No intake/output data recorded.  SpO2: 100 % O2 Flow Rate (L/min): 15 L/min FiO2 (%): 50 %  Estimated body mass index is 39.77 kg/m as calculated from the following:   Height  as of this encounter: 5\' 5"  (1.651 m).   Weight as of this encounter: 108.4 kg.  SIGNIFICANT EVENTS   REVIEW OF SYSTEMS  PATIENT IS UNABLE TO PROVIDE COMPLETE REVIEW OF SYSTEMS DUE TO SEVERE CRITICAL ILLNESS      COVID-19 DISASTER DECLARATION:   FULL CONTACT PHYSICAL EXAMINATION WAS NOT POSSIBLE DUE TO TREATMENT OF COVID-19  AND CONSERVATION OF PERSONAL PROTECTIVE EQUIPMENT, LIMITED EXAM FINDINGS INCLUDE-   PHYSICAL EXAMINATION:  GENERAL:critically ill appearing, +resp distress NEUROLOGIC: obtunded,  GCS<8   Patient assessed or the symptoms described in the history of present illness.  In the context of the Global COVID-19 pandemic, which necessitated consideration that the patient might be at risk for infection with the SARS-CoV-2 virus that causes COVID-19, Institutional protocols and algorithms that pertain to the evaluation of patients at risk for COVID-19 are in a state of rapid change based on information released by regulatory bodies including the CDC and federal and state organizations. These policies and algorithms were followed during the patient's care while in hospital.    MEDICATIONS: I have reviewed all medications and confirmed regimen as documented   CULTURE RESULTS   Recent Results (from the past 240 hour(s))  Culture, respiratory (non-expectorated)     Status: None   Collection Time: 08/01/20 10:46 AM   Specimen: Tracheal Aspirate; Respiratory  Result Value Ref Range Status   Specimen Description   Final    TRACHEAL ASPIRATE Performed at [facility], [address] [place], [place] [address]    Special Requests   Final    NONE Performed at [facility], [place], [place] [address]    Gram Stain NO WBC SEEN NO ORGANISMS SEEN   Final   Culture   Final    RARE Normal respiratory flora-no Staph aureus or Pseudomonas seen Performed at [facility] [address], [place], [place] [address]    Report Status 08/03/2020 FINAL  Final          IMAGING    DG Chest Port 1 View  Result Date: 08/10/2020 CLINICAL DATA:  Acute respiratory failure. EXAM: PORTABLE CHEST 1 VIEW COMPARISON:  08/07/2020.  07/31/2020. FINDINGS: Endotracheal tube, NG tube, left IJ line in stable position. Stable cardiomegaly. Multifocal bilateral pulmonary infiltrates are again noted. Small bilateral pleural effusions. No pneumothorax. IMPRESSION: 1. Lines and tubes in stable position. 2. Stable cardiomegaly. 3. Multifocal bilateral pulmonary  infiltrates again noted. No interim change. Electronically Signed   By: Marcello Moores  Register   On: 08/10/2020 05:13     Nutrition Status: Nutrition Problem: Inadequate oral intake Etiology: inability to eat Signs/Symptoms: NPO status Interventions: Tube feeding, MVI, Prostat     Indwelling Urinary Catheter continued, requirement due to   Reason to continue Indwelling Urinary Catheter strict Intake/Output monitoring for hemodynamic instability   Central Line/ continued, requirement due to  Reason to continue Travis of central venous pressure or other hemodynamic parameters and poor IV access   Ventilator continued, requirement due to severe respiratory failure   Ventilator Sedation RASS 0 to -2      ASSESSMENT AND PLAN SYNOPSIS  Acute hypoxemic respiratory failure due to COVID-19 pneumonia / ARDS Mechanical ventilation via ARDS protocol, target PRVC 6 cc/kg Wean PEEP and FiO2 as able Goal plateau pressure less than 30, driving pressure less than 15 Paralytics if necessary for vent synchrony, gas exchange Cycle prone positioning if necessary for oxygenation Deep sedation per PAD protocol, goal RASS -4, currently fentanyl,  midazolam Diuresis as blood pressure and renal function can tolerate, goal CVP 5-8.   diuresis as tolerated based on Kidney function VAP prevention order set Remdesivir IV STEROIDS   Severe ACUTE Hypoxic and Hypercapnic Respiratory Failure -continue Full MV support -continue Bronchodilator Therapy -Wean Fio2 and PEEP as tolerated -VAP/VENT bundle implementation Unable to wean at this time  Morbid obesity, possible OSA.   Will certainly impact respiratory mechanics, ventilator weaning Suspect will need to consider additional PEEP   ACUTE KIDNEY INJURY/Renal Failure -continue Foley Catheter-assess need -Avoid nephrotoxic agents -Follow urine output, BMP -Ensure adequate renal perfusion, optimize oxygenation -Renal dose  medications     NEUROLOGY Acute toxic metabolic encephalopathy, need for sedation Goal RASS -2 to -3  CARDIAC ICU monitoring  ID -continue IV abx as prescibed -follow up cultures  GI GI PROPHYLAXIS as indicated   DIET-->TF's as tolerated Constipation protocol as indicated  ENDO - will use ICU hypoglycemic\Hyperglycemia protocol if indicated     ELECTROLYTES -follow labs as needed -replace as needed -pharmacy consultation and following   DVT/GI PRX ordered and assessed TRANSFUSIONS AS NEEDED MONITOR FSBS I Assessed the need for Labs I Assessed the need for Foley I Assessed the need for Central Venous Line Family Discussion when available I Assessed the need for Mobilization I made an Assessment of medications to be adjusted accordingly Safety Risk assessment completed   CASE DISCUSSED IN MULTIDISCIPLINARY ROUNDS WITH ICU TEAM  Critical Care Time devoted to patient care services described in this note is 32 minutes.   Overall, patient is critically ill, prognosis is guarded.  Patient with Multiorgan failure and at high risk for cardiac arrest and death.    Corrin Parker, M.D.  [facility]  Medical Director [place] Director [facility] Cardio-Pulmonary Department

## 2020-08-11 ENCOUNTER — Inpatient Hospital Stay: Payer: Medicare Other

## 2020-08-11 DIAGNOSIS — U071 COVID-19: Secondary | ICD-10-CM | POA: Diagnosis not present

## 2020-08-11 LAB — CBC WITH DIFFERENTIAL/PLATELET
Abs Immature Granulocytes: 0.03 10*3/uL (ref 0.00–0.07)
Basophils Absolute: 0 10*3/uL (ref 0.0–0.1)
Basophils Relative: 0 %
Eosinophils Absolute: 0.2 10*3/uL (ref 0.0–0.5)
Eosinophils Relative: 2 %
HCT: 35.6 % — ABNORMAL LOW (ref 36.0–46.0)
Hemoglobin: 11.2 g/dL — ABNORMAL LOW (ref 12.0–15.0)
Immature Granulocytes: 0 %
Lymphocytes Relative: 10 %
Lymphs Abs: 0.8 10*3/uL (ref 0.7–4.0)
MCH: 27.9 pg (ref 26.0–34.0)
MCHC: 31.5 g/dL (ref 30.0–36.0)
MCV: 88.8 fL (ref 80.0–100.0)
Monocytes Absolute: 0.4 10*3/uL (ref 0.1–1.0)
Monocytes Relative: 6 %
Neutro Abs: 6.4 10*3/uL (ref 1.7–7.7)
Neutrophils Relative %: 82 %
Platelets: 137 10*3/uL — ABNORMAL LOW (ref 150–400)
RBC: 4.01 MIL/uL (ref 3.87–5.11)
RDW: 16.3 % — ABNORMAL HIGH (ref 11.5–15.5)
WBC: 7.8 10*3/uL (ref 4.0–10.5)
nRBC: 0 % (ref 0.0–0.2)

## 2020-08-11 LAB — COMPREHENSIVE METABOLIC PANEL
ALT: 80 U/L — ABNORMAL HIGH (ref 0–44)
AST: 43 U/L — ABNORMAL HIGH (ref 15–41)
Albumin: 2.5 g/dL — ABNORMAL LOW (ref 3.5–5.0)
Alkaline Phosphatase: 47 U/L (ref 38–126)
Anion gap: 11 (ref 5–15)
BUN: 44 mg/dL — ABNORMAL HIGH (ref 8–23)
CO2: 25 mmol/L (ref 22–32)
Calcium: 9.8 mg/dL (ref 8.9–10.3)
Chloride: 115 mmol/L — ABNORMAL HIGH (ref 98–111)
Creatinine, Ser: 1.04 mg/dL — ABNORMAL HIGH (ref 0.44–1.00)
GFR calc non Af Amer: 56 mL/min — ABNORMAL LOW (ref >60)
Glucose, Bld: 132 mg/dL — ABNORMAL HIGH (ref 70–99)
Potassium: 3.5 mmol/L (ref 3.5–5.1)
Sodium: 151 mmol/L — ABNORMAL HIGH (ref 135–145)
Total Bilirubin: 0.8 mg/dL (ref 0.3–1.2)
Total Protein: 5.8 g/dL — ABNORMAL LOW (ref 6.5–8.1)

## 2020-08-11 LAB — BLOOD GAS, ARTERIAL
Acid-Base Excess: 4.3 mmol/L — ABNORMAL HIGH (ref 0.0–2.0)
Bicarbonate: 29.2 mmol/L — ABNORMAL HIGH (ref 20.0–28.0)
FIO2: 40
O2 Saturation: 96.5 %
Patient temperature: 37
pCO2 arterial: 44 mmHg (ref 32.0–48.0)
pH, Arterial: 7.43 (ref 7.350–7.450)
pO2, Arterial: 83 mmHg (ref 83.0–108.0)

## 2020-08-11 LAB — GLUCOSE, CAPILLARY
Glucose-Capillary: 120 mg/dL — ABNORMAL HIGH (ref 70–99)
Glucose-Capillary: 127 mg/dL — ABNORMAL HIGH (ref 70–99)
Glucose-Capillary: 143 mg/dL — ABNORMAL HIGH (ref 70–99)
Glucose-Capillary: 144 mg/dL — ABNORMAL HIGH (ref 70–99)
Glucose-Capillary: 152 mg/dL — ABNORMAL HIGH (ref 70–99)
Glucose-Capillary: 159 mg/dL — ABNORMAL HIGH (ref 70–99)

## 2020-08-11 LAB — BLOOD GAS, VENOUS
Acid-Base Excess: 4.5 mmol/L — ABNORMAL HIGH (ref 0.0–2.0)
Bicarbonate: 29.2 mmol/L — ABNORMAL HIGH (ref 20.0–28.0)
O2 Saturation: 78.4 %
Patient temperature: 37
pCO2, Ven: 43 mmHg — ABNORMAL LOW (ref 44.0–60.0)
pH, Ven: 7.44 — ABNORMAL HIGH (ref 7.250–7.430)
pO2, Ven: 41 mmHg (ref 32.0–45.0)

## 2020-08-11 LAB — MAGNESIUM: Magnesium: 1.9 mg/dL (ref 1.7–2.4)

## 2020-08-11 LAB — PHOSPHORUS: Phosphorus: 2.4 mg/dL — ABNORMAL LOW (ref 2.5–4.6)

## 2020-08-11 LAB — TRIGLYCERIDES: Triglycerides: 83 mg/dL (ref <150)

## 2020-08-11 MED ORDER — POTASSIUM CHLORIDE 20 MEQ PO PACK: 40.0000 meq | PACK | Freq: Once | ORAL | Status: DC

## 2020-08-11 MED ORDER — PROPOFOL 1000 MG/100ML IV EMUL
5.0000 ug/kg/min | INTRAVENOUS | Status: DC
  Administered 2020-08-11: 20 ug/kg/min via INTRAVENOUS
  Administered 2020-08-11: 15 ug/kg/min via INTRAVENOUS
  Administered 2020-08-12 (×3): 20 ug/kg/min via INTRAVENOUS
  Administered 2020-08-13: 40 ug/kg/min via INTRAVENOUS
  Administered 2020-08-13: 20 ug/kg/min via INTRAVENOUS
  Administered 2020-08-14: 10 ug/kg/min via INTRAVENOUS
  Filled 2020-08-11 (×10): qty 100

## 2020-08-11 MED ORDER — VECURONIUM BROMIDE 10 MG IV SOLR: 10.0000 mg | Freq: Once | INTRAVENOUS | Status: AC

## 2020-08-11 MED ORDER — DIGOXIN 0.25 MG/ML IJ SOLN
0.2500 mg | Freq: Once | INTRAMUSCULAR | Status: AC
  Administered 2020-08-11: 0.25 mg via INTRAVENOUS
  Filled 2020-08-11: qty 2

## 2020-08-11 MED ORDER — DIGOXIN 0.25 MG/ML IJ SOLN
0.2500 mg | Freq: Every day | INTRAMUSCULAR | Status: DC
  Administered 2020-08-12 – 2020-08-17 (×6): 0.25 mg via INTRAVENOUS
  Filled 2020-08-11 (×6): qty 2

## 2020-08-11 MED ORDER — FENTANYL CITRATE (PF) 100 MCG/2ML IJ SOLN
INTRAMUSCULAR | Status: AC
  Administered 2020-08-11: 200 ug via INTRAVENOUS
  Filled 2020-08-11: qty 4

## 2020-08-11 MED ORDER — MIDAZOLAM HCL 2 MG/2ML IJ SOLN: 4.0000 mg | Freq: Once | INTRAMUSCULAR | Status: AC

## 2020-08-11 MED ORDER — ORAL CARE MOUTH RINSE
15.0000 mL | OROMUCOSAL | Status: DC
  Administered 2020-08-11 – 2020-08-15 (×41): 15 mL via OROMUCOSAL

## 2020-08-11 MED ORDER — POTASSIUM & SODIUM PHOSPHATES 280-160-250 MG PO PACK
1.0000 | PACK | Freq: Once | ORAL | Status: DC
  Filled 2020-08-11: qty 1

## 2020-08-11 MED ORDER — POTASSIUM PHOSPHATES 15 MMOLE/5ML IV SOLN
10.0000 mmol | Freq: Once | INTRAVENOUS | Status: AC
  Administered 2020-08-11: 10 mmol via INTRAVENOUS
  Filled 2020-08-11: qty 3.33

## 2020-08-11 MED ORDER — CHLORHEXIDINE GLUCONATE 0.12% ORAL RINSE (MEDLINE KIT)
15.0000 mL | Freq: Two times a day (BID) | OROMUCOSAL | Status: DC
  Administered 2020-08-11 – 2020-08-15 (×8): 15 mL via OROMUCOSAL

## 2020-08-11 MED ORDER — MIDAZOLAM HCL 2 MG/2ML IJ SOLN
INTRAMUSCULAR | Status: AC
  Administered 2020-08-11: 4 mg via INTRAVENOUS
  Filled 2020-08-11: qty 4

## 2020-08-11 MED ORDER — FENTANYL CITRATE (PF) 100 MCG/2ML IJ SOLN: 200.0000 ug | Freq: Once | INTRAMUSCULAR | Status: AC

## 2020-08-11 MED ORDER — VECURONIUM BROMIDE 10 MG IV SOLR
INTRAVENOUS | Status: AC
  Administered 2020-08-11: 10 mg via INTRAVENOUS
  Filled 2020-08-11: qty 10

## 2020-08-11 MED ORDER — AMIODARONE IV BOLUS ONLY 150 MG/100ML
150.0000 mg | Freq: Once | INTRAVENOUS | Status: AC
  Administered 2020-08-11: 150 mg via INTRAVENOUS
  Filled 2020-08-11: qty 100

## 2020-08-11 MED ORDER — FENTANYL 2500MCG IN NS 250ML (10MCG/ML) PREMIX INFUSION
0.0000 ug/h | INTRAVENOUS | Status: DC
  Administered 2020-08-14: 25 ug/h via INTRAVENOUS
  Filled 2020-08-11: qty 250

## 2020-08-11 NOTE — Plan of Care (Signed)

## 2020-08-11 NOTE — Progress Notes (Signed)
Chaplain On-Call responded to a page from Jan at the [facility] about a visitor seeking information about Advance Directives.  Chaplain met the visitor who is the daughter-in-law of the patient. She stated that her husband is seeking information about a Will for the patient with regard to finances and property. Chaplain responded that the documents here at the hospital are only for purposes of health care decisions by the patient.  The daughter-in-law stated that she will consult an Attorney for the correct information. Chaplain provided supportive listening and presence.  Jameson Alvia Tory M.Div., [facility]

## 2020-08-11 NOTE — Progress Notes (Signed)
Patient with Acute Resp failure from acute aspiration Patient was emergently intubated  Family-Son Courtney Anderson was notified.

## 2020-08-11 NOTE — Progress Notes (Signed)
Nutrition Follow-up  DOCUMENTATION CODES:   Obesity unspecified  INTERVENTION:   RD will add supplements once diet advanced and pending SLP recommendations  NUTRITION DIAGNOSIS:   Inadequate oral intake related to inability to eat as evidenced by NPO status. Ongoing.  GOAL:   Patient will meet greater than or equal to 90% of their needs -previously met with TF regimen.  MONITOR:   Diet advancement, Labs, Weight trends, Skin, I & O's  ASSESSMENT:   64 year old female with PMHx of HTN, HLD who was admitted with COVID-19 PNA, also with A-fib with RVR and severe intertrigo.   Pt extubated 10/7. SLP evaluation pending; per SLP note, patient not medically ready today as she is not yet alert. Phosphorus slightly low and is being replaced. RD will add supplements once diet is advanced and pending SLP recommendations. Per chart, pt appears fairly weight stable since 9/27 but has not been weighed since 10/3.   Medications reviewed and include: vitamin C, aspirin, vitamin D, insulin, MVI, zinc, pepcid, Kphos  Labs reviewed: Na 151(H), BUN 44(H), creat 1.04(H), P 2.4(L), Mg 1.9(L), AST 43(H), ALT 80(H) cbgs- 150, 153, 131, 105, 127 x 24 hrs  Diet Order:   Diet Order    None     EDUCATION NEEDS:   No education needs have been identified at this time  Skin:  Skin Assessment: Skin Integrity Issues: Skin Integrity Issues:: Other (Comment) Other: intertriginous dermatitis to B/L breasts, B/L groin, under pannus, umbilicus; non pressure wound to right lower abdomen  Last BM:  10/8- type 7  Height:   Ht Readings from Last 1 Encounters:  08/05/20 '5\' 5"'  (1.651 m)   Weight:   Wt Readings from Last 1 Encounters:  08/06/20 108.4 kg   Ideal Body Weight:  56.8 kg  BMI:  Body mass index is 39.77 kg/m.  Estimated Nutritional Needs:   Kcal:  2200-2500kcal/day  Protein:  110-125g/day  Fluid:  >/= 2 L/day  Koleen Distance MS, RD, LDN Please refer to [facility] for RD and/or RD  on-call/weekend/after hours pager

## 2020-08-11 NOTE — Progress Notes (Signed)
CRITICAL CARE NOTE  Severe respiratory distress and patient with COVID-19 pneumonia.  Courtney Anderson an 64 y.o.female.  [id number] is a 64 year old female with history noted below, who was admitted to [facility] on 2020-07-24 brought in via EMS as a "code sepsis". Patient subsequently tested positive for COVID-19. She presented with oxygen saturations of 69% on room air and had to be placed on nonrebreather mask to achieve oxygen saturations in the 90s. She was subsequently admitted to the Covid unit for management of COVID-19 pneumonia. She was noted to have acute kidney injury she was treated with remdesivir, zinc, vitamin C and antitussives. She was placed empirically on Rocephin and azithromycin. She continued to have increased FiO2 requirements. She was refusing to self prone. She had atrial fibrillation and was seen by cardiologyto assist in management.   07/31/20- patient is on 90% FiO2, will modify meds as patient is on metoprolol and levophed as well as cardizem. Will d/c propofol to improve SBP and remove arythmogenic vasopressor if able. Plan to have CVP trending and diuresis today. Goals of care discussion today - patient made DNR per sister and son of patient. I had 2 separate phone meetings with son and separate conversation with sister today to explain patient is worse with shock and renal failure requiring multiple pressors.   08/01/20- patient remains critically ill. FiO2 has been weaned down to 80% today.  08/03/20- patient remains critically ill shes being proned, weaning FiO2 as able.  08/04/20-patient with no interval changes continue with weaning and proning protocol. Continue weaning FiO2 and PEEP as to optimize for SBT 08/06/20- patient is weaned down 40%FiO2 today, will attempt awakening trial with SBT, I spoke to son [name] today, he appreciates update and does not want Elink video view at this time.  08/07/20- patient is requiring 50%FiO2 today 08/08/20-  Continuing to wean vent. FiO2 is 28%, will trial SBT today 08/09/20-tachypneic and hypertensive with SBT. Asynchronous.FiO2 back up to 50% 10/7 severe resp failure, attempt SAT/SBT 10/7 extubated to biPAP 10/8 Tachycardic , more confused, resp status OK   CC  follow up respiratory failure  SUBJECTIVE Patient extubated Prognosis is guarded Confused High risk for acute decompensation   BP 122/78   Pulse (!) 112   Temp (!) 96.1 F (35.6 C) (Oral)   Resp (!) 27   Ht 5\' 5"  (1.651 m)   Wt 108.4 kg   SpO2 95%   BMI 39.77 kg/m    I/O last 3 completed shifts: In: 4115.2 [I.V.:1110.1; Other:80; QQ/PY:1950; IV Piggyback:100.1] Out: 9326 [Urine:1495] No intake/output data recorded.  SpO2: 95 % O2 Flow Rate (L/min): 2 L/min FiO2 (%): 30 %  Estimated body mass index is 39.77 kg/m as calculated from the following:   Height as of this encounter: 5\' 5"  (1.651 m).   Weight as of this encounter: 108.4 kg.  SIGNIFICANT EVENTS   REVIEW OF SYSTEMS  PATIENT IS UNABLE TO PROVIDE COMPLETE REVIEW OF SYSTEMS DUE TO SEVERE CRITICAL ILLNESS      COVID-19 DISASTER DECLARATION:   FULL CONTACT PHYSICAL EXAMINATION WAS NOT POSSIBLE DUE TO TREATMENT OF COVID-19  AND CONSERVATION OF PERSONAL PROTECTIVE EQUIPMENT, LIMITED EXAM FINDINGS INCLUDE-   PHYSICAL EXAMINATION:  GENERAL:critically ill appearing, NEUROLOGIC: confused   Patient assessed or the symptoms described in the history of present illness.  In the context of the Global COVID-19 pandemic, which necessitated consideration that the patient might be at risk for infection with the SARS-CoV-2 virus that causes COVID-19, Institutional protocols and algorithms  that pertain to the evaluation of patients at risk for COVID-19 are in a state of rapid change based on information released by regulatory bodies including the CDC and federal and state organizations. These policies and algorithms were followed during the patient's care while  in hospital.    MEDICATIONS: I have reviewed all medications and confirmed regimen as documented   CULTURE RESULTS   Recent Results (from the past 240 hour(s))  Culture, respiratory (non-expectorated)     Status: None   Collection Time: 08/01/20 10:46 AM   Specimen: Tracheal Aspirate; Respiratory  Result Value Ref Range Status   Specimen Description   Final    TRACHEAL ASPIRATE Performed at [facility], [address], [place], [place] [address]    Special Requests   Final    NONE Performed at [facility], [place], [place] [address]    Gram Stain NO WBC SEEN NO ORGANISMS SEEN   Final   Culture   Final    RARE Normal respiratory flora-no Staph aureus or Pseudomonas seen Performed at [facility] [address], [place], [place] [address]    Report Status 08/03/2020 FINAL  Final          IMAGING    No results found.   Nutrition Status: Nutrition Problem: Inadequate oral intake Etiology: inability to eat Signs/Symptoms: NPO status Interventions: Tube feeding, MVI, Prostat     Indwelling Urinary Catheter continued, requirement due to   Reason to continue Indwelling Urinary Catheter strict Intake/Output monitoring for hemodynamic instability   Central Line/ continued, requirement due to  Reason to continue Powell of central venous pressure or other hemodynamic parameters and poor IV access       ASSESSMENT AND PLAN SYNOPSIS  Acute hypoxemic respiratory failure due to COVID-19 pneumonia / ARDS S/p extubation, biPAP as needed Oxygen as needed IV STEROIDS  DNR/DNI but need to confirm with family  Severe ACUTE Hypoxic and Hypercapnic Respiratory Failure Very tenuous for acute decompensation  Morbid obesity, possible OSA.   Will certainly impact respiratory mechanics     NEUROLOGY Acute toxic metabolic encephalopathy from [place] ICU monitoring  ID -continue IV abx as  prescibed -follow up cultures  GI GI PROPHYLAXIS as indicated   DIET--NPO Constipation protocol as indicated  ENDO - will use ICU hypoglycemic\Hyperglycemia protocol if indicated     ELECTROLYTES -follow labs as needed -replace as needed -pharmacy consultation and following   DVT/GI PRX ordered and assessed TRANSFUSIONS AS NEEDED MONITOR FSBS I Assessed the need for Labs I Assessed the need for Foley I Assessed the need for Central Venous Line Family Discussion when available I Assessed the need for Mobilization I made an Assessment of medications to be adjusted accordingly Safety Risk assessment completed   CASE DISCUSSED IN MULTIDISCIPLINARY ROUNDS WITH ICU TEAM  Critical Care Time devoted to patient care services described in this note is 35 minutes.   Overall, patient is critically ill, prognosis is guarded.     Corrin Parker, M.D.  [facility]  Medical Director [place] Director [facility] Cardio-Pulmonary Department

## 2020-08-11 NOTE — Progress Notes (Addendum)
Shift summary:  - Patient remains delirious.  - Rigors, vomiting, hypothermia this AM; MD notified. - Worsening RVR today also; amio infusion increased. - SLP eval on hold. - Emergently intubated this afternoon.

## 2020-08-11 NOTE — Progress Notes (Signed)
SLP Cancellation Note  Patient Details Name: Courtney Anderson MRN: [id number] DOB: [birth year]   Cancelled treatment:       Reason Eval/Treat Not Completed: Patient not medically ready;Medical issues which prohibited therapy (chart reviewed; consulted NSG re: pt's status). Pt's medical status is not conducive for BSE at this time. Pt's HR is elevated (mid120s), low BP. Pt is not sufficiently alert for safe oral intake. ST services will f/u tomorrow to assess pt's status for BSE. Recommend frequent oral care for hygiene and stimulation of swallowing; aspiration precautions.     [name], MS, CCC-SLP Speech Language Pathologist Rehab Services [phone] [facility] 08/11/2020, 8:51 AM

## 2020-08-11 NOTE — Progress Notes (Signed)
Voice and video call with son. Patient now re-intubated and sedated

## 2020-08-11 NOTE — Procedures (Signed)
Endotracheal Intubation: Patient required placement of an artificial airway secondary to Respiratory Failure  Consent: Emergent.   Hand washing performed prior to starting the procedure.   Medications administered for sedation prior to procedure:  Midazolam 4 mg IV,  Vecuronium 10 mg IV, Fentanyl 100 mcg IV.    A time out procedure was called and correct patient, name, & ID confirmed. Needed supplies and equipment were assembled and checked to include ETT, 10 ml syringe, Glidescope, Mac and Miller blades, suction, oxygen and bag mask valve, end tidal CO2 monitor.   Patient was positioned to align the mouth and pharynx to facilitate visualization of the glottis.   Heart rate, SpO2 and blood pressure was continuously monitored during the procedure. Pre-oxygenation was conducted prior to intubation and endotracheal tube was placed through the vocal cords into the trachea.     The artificial airway was placed under direct visualization via glidescope route using a 8.0 ETT on the first attempt.  ETT was secured at 23 cm mark.  Placement was confirmed by auscuitation of lungs with good breath sounds bilaterally and no stomach sounds.  Condensation was noted on endotracheal tube.   Pulse ox 98%.  CO2 detector in place with appropriate color change.   Complications: None .   Operator: Harish Bram.   Chest radiograph ordered and pending.    Corrin Parker, M.D.  [facility]  Medical Director [place] Director [facility] Cardio-Pulmonary Department

## 2020-08-11 NOTE — Progress Notes (Signed)
[facility]  SUBJECTIVE: Patient critically ill, intubated   Vitals:   08/11/20 0800 08/11/20 0900 08/11/20 1000 08/11/20 1100  BP: (!) 158/74 103/78 (!) 149/128 124/88  Pulse: (!) 127 87 60 72  Resp: (!) 27 (!) 25 13 (!) 25  Temp: (!) 96.3 F (35.7 C)   97.6 F (36.4 C)  TempSrc: Axillary   Axillary  SpO2: 91% 95% 93% (!) 89%  Weight:      Height:         Intake/Output Summary (Last 24 hours) at 08/11/2020 1538 Last data filed at 08/11/2020 1300 Gross per 24 hour  Intake 518.64 ml  Output 1030 ml  Net -511.36 ml      PHYSICAL EXAM  General: Intubated HEENT:  Normocephalic and atramatic Neck:  No JVD.  Lungs: Clear bilaterally to auscultation and percussion. Heart: Irregular irregular rhythm Abdomen: Bowel sounds are positive, abdomen soft and non-tender  Msk:  Back normal, normal gait. Normal strength and tone for age. Extremities: No clubbing, cyanosis or edema.   Neuro: Intubated Psych: Intubated   LABS: Basic Metabolic Panel: Recent Labs    08/10/20 0347 08/11/20 0304  NA 150* 151*  K 4.1 3.5  CL 116* 115*  CO2 28 25  GLUCOSE 152* 132*  BUN 53* 44*  CREATININE 1.18* 1.04*  CALCIUM 9.5 9.8  MG 2.2 1.9  PHOS 3.0 2.4*   Liver Function Tests: Recent Labs    08/10/20 0347 08/11/20 0304  AST 37 43*  ALT 83* 80*  ALKPHOS 42 47  BILITOT 0.6 0.8  PROT 5.5* 5.8*  ALBUMIN 2.3* 2.5*   No results for input(s): LIPASE, AMYLASE in the last 72 hours. CBC: Recent Labs    08/10/20 0347 08/11/20 0304  WBC 7.0 7.8  NEUTROABS 5.3 6.4  HGB 10.6* 11.2*  HCT 32.9* 35.6*  MCV 87.5 88.8  PLT 119* 137*   Cardiac Enzymes: No results for input(s): CKTOTAL, CKMB, CKMBINDEX, TROPONINI in the last 72 hours. BNP: Invalid input(s): POCBNP D-Dimer: No results for input(s): DDIMER in the last 72 hours. Hemoglobin A1C: No results for input(s): HGBA1C in the last 72 hours. Fasting Lipid Panel: Recent Labs    08/11/20 0304  TRIG 83   Thyroid Function  Tests: No results for input(s): TSH, T4TOTAL, T3FREE, THYROIDAB in the last 72 hours.  Invalid input(s): FREET3 Anemia Panel: No results for input(s): VITAMINB12, FOLATE, FERRITIN, TIBC, IRON, RETICCTPCT in the last 72 hours.  DG Chest Port 1 View  Result Date: 08/10/2020 CLINICAL DATA:  Acute respiratory failure. EXAM: PORTABLE CHEST 1 VIEW COMPARISON:  08/07/2020.  07/31/2020. FINDINGS: Endotracheal tube, NG tube, left IJ line in stable position. Stable cardiomegaly. Multifocal bilateral pulmonary infiltrates are again noted. Small bilateral pleural effusions. No pneumothorax. IMPRESSION: 1. Lines and tubes in stable position. 2. Stable cardiomegaly. 3. Multifocal bilateral pulmonary infiltrates again noted. No interim change. Electronically Signed   By: Marcello Moores  Register   On: 08/10/2020 05:13     Echo LVEF 45 to 50%  TELEMETRY: Atrial fibrillation at 150 bpm:  ASSESSMENT AND PLAN:  Principal Problem:   Sepsis ([place]) Active Problems:   Hypertension   Morbid obesity ([place])   Hypercalcemia   Hyperthyroidism   AKI (acute kidney injury) ([place])   COVID-19 virus infection   Atrial fibrillation with RVR (H[facility])   Hypokalemia    1. Persistent atrial fibrillation with rapid ventricular rate, exacerbated by possible aspiration pneumonia, recurrent respiratory failure, reintubation 2. COVID-19 pneumonia/sepsis, extubation followed by respiratory failure requiring reintubation  3. Poor prognosis/DNR  Recommendations  1. Agree with current therapy 2. Continue amiodarone infusion for now 3. Add digoxin 0.25 mg IV every 2 hours x 2 then daily 4. Consider adding metoprolol 25 mg every 12h after patient receives NG tube   Isaias Cowman, MD, PhD, [facility] 08/11/2020 3:38 PM

## 2020-08-11 NOTE — Progress Notes (Signed)
[place] for Electrolyte Monitoring and Replacement   Recent Labs: Potassium (mmol/L)  Date Value  08/11/2020 3.5   Magnesium (mg/dL)  Date Value  08/11/2020 1.9   Calcium (mg/dL)  Date Value  08/11/2020 9.8   Calcium, Total (PTH) (mg/dL)  Date Value  07/24/2020 10.1   Albumin (g/dL)  Date Value  08/11/2020 2.5 (L)  10/22/2019 4.2   Phosphorus (mg/dL)  Date Value  08/11/2020 2.4 (L)   Sodium (mmol/L)  Date Value  08/11/2020 151 (H)  10/22/2019 139    Assessment: 64 year old female with PMHx of HTN, HLD who was admitted with COVID-19 PNA, also with A-fib with RVR and severe intertrigo.  Goal of Therapy:  Potassium 4.0 - 5.1 mmol/L Magnesium 2.0 - 2.4 mg/dL All Other Electrolytes WNL  Plan:   10 mmol IV potassium phosphate (contains 14.5 mEq IV potassium)  F/U electrolytes in am  Dallie Piles ,PharmD Clinical Pharmacist 08/11/2020 7:06 AM

## 2020-08-11 NOTE — Progress Notes (Signed)
Daily Progress Note   Patient Name: Courtney Anderson       Date: 08/11/2020 DOB: 10/11/1956  Age: 64 y.o. MRN#: [id number] Attending Physician: Flora Lipps, MD Primary Care Physician: Guadalupe Maple, MD Admit Date: 07/24/2020  Reason for Consultation/Follow-up: Establishing goals of care  Subjective: Patient observed resting in bed. No acute distress noted. Extubated successfully to BiPAP. Remains at high risk for decompensation. Tachycardic.   I spoke with son and patient's sister via phone and provided extensive updates. He verbalized understanding. I discussed at length with son patient's poor prognosis despite ability to extubate. Education provided on her high risk of declining and requiring additional respiratory support again. He was tearful and verbalized understanding. 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 I acknowledged their faith also with transparency that patient may have been all of those things but sometimes the body is no longer to be the person family have always known them to be and grows tired and unable to fight and recover. Son tearful expressing he can not give up on his mother at this time. Support given and opportunity created to express he is not giving up on her but advocating for her quality of life and understanding of her current condition and needs. I explained by making decisions to allow her to be comfortable does not mean that he is "giving up" on her and that making the best decisions for her is important and that his "fight" is just changing focus and he would then be allowing the care to focus on keeping her comfortable during what time she has left. Family verbalized understanding and appreciation.   Family clear they would like to continue to treat the treatable and make decisions as needed. Son states he does not think he would want to have her placed back  on the ventilator, but does wish to continue with aggressive care to allow her an opportunity to show improvement. He states "we have been praying to God and he has already given [place] confirmation that all will be well and my mom will beat this!" He states he is not worried about the outcome.   He is asking for a note identifying she is in the hospital and also inquiring about visiting with patient.   All questions answered and support provided.   Length of Stay: 18 days  Vital Signs: BP 122/78   Pulse (!) 112   Temp (!) 96.3 F (35.7 C) (Axillary)   Resp (!) 27   Ht 5\' 5"  (1.651 m)   Wt 108.4 kg   SpO2 95%   BMI 39.77 kg/m  SpO2: SpO2: 95 % O2 Device: O2 Device: Nasal Cannula O2 Flow Rate: O2 Flow Rate (L/min): 2 L/min  The above conversation was completed via telephone due to the visitor restrictions during the COVID-19 pandemic. Thorough chart review and discussion with necessary members of the care team was completed as part of assessment. All issues were discussed and addressed but no physical exam was performed.  Palliative Care Assessment & Plan   Code Status:  DNR  Goals of Care/Recommendations:  Continue to treat the treatable.   Family remains hopeful for improvement and speaks of their faith in God to heal patient.   PMT will continue to support and follow.    Prognosis: Guarded-Poor   Discharge Planning: To Be Determined  Thank you for allowing the Palliative Medicine  Team to assist in the care of this patient.  Time Total: 45 min.   Visit consisted of counseling and education dealing with the complex and emotionally intense issues of symptom management and palliative care in the setting of serious and potentially life-threatening illness.Greater than 50%  of this time was spent counseling and coordinating care related to the above assessment and plan.  Alda Lea, AGPCNP-BC  Palliative Medicine Team [phone]

## 2020-08-11 NOTE — Progress Notes (Signed)
Pt's ETT was pulled back 2 cm to 25 at the lip.

## 2020-08-12 DIAGNOSIS — I4891 Unspecified atrial fibrillation: Secondary | ICD-10-CM | POA: Diagnosis not present

## 2020-08-12 DIAGNOSIS — U071 COVID-19: Secondary | ICD-10-CM | POA: Diagnosis not present

## 2020-08-12 LAB — CBC WITH DIFFERENTIAL/PLATELET
Abs Immature Granulocytes: 0.03 10*3/uL (ref 0.00–0.07)
Basophils Absolute: 0 10*3/uL (ref 0.0–0.1)
Basophils Relative: 0 %
Eosinophils Absolute: 0.4 10*3/uL (ref 0.0–0.5)
Eosinophils Relative: 5 %
HCT: 32.2 % — ABNORMAL LOW (ref 36.0–46.0)
Hemoglobin: 10 g/dL — ABNORMAL LOW (ref 12.0–15.0)
Immature Granulocytes: 0 %
Lymphocytes Relative: 10 %
Lymphs Abs: 0.8 10*3/uL (ref 0.7–4.0)
MCH: 28.4 pg (ref 26.0–34.0)
MCHC: 31.1 g/dL (ref 30.0–36.0)
MCV: 91.5 fL (ref 80.0–100.0)
Monocytes Absolute: 0.5 10*3/uL (ref 0.1–1.0)
Monocytes Relative: 7 %
Neutro Abs: 5.8 10*3/uL (ref 1.7–7.7)
Neutrophils Relative %: 78 %
Platelets: 153 10*3/uL (ref 150–400)
RBC: 3.52 MIL/uL — ABNORMAL LOW (ref 3.87–5.11)
RDW: 17.1 % — ABNORMAL HIGH (ref 11.5–15.5)
WBC: 7.4 10*3/uL (ref 4.0–10.5)
nRBC: 0 % (ref 0.0–0.2)

## 2020-08-12 LAB — GLUCOSE, CAPILLARY
Glucose-Capillary: 126 mg/dL — ABNORMAL HIGH (ref 70–99)
Glucose-Capillary: 113 mg/dL — ABNORMAL HIGH (ref 70–99)
Glucose-Capillary: 111 mg/dL — ABNORMAL HIGH (ref 70–99)
Glucose-Capillary: 118 mg/dL — ABNORMAL HIGH (ref 70–99)
Glucose-Capillary: 123 mg/dL — ABNORMAL HIGH (ref 70–99)
Glucose-Capillary: 100 mg/dL — ABNORMAL HIGH (ref 70–99)
Glucose-Capillary: 101 mg/dL — ABNORMAL HIGH (ref 70–99)

## 2020-08-12 LAB — COMPREHENSIVE METABOLIC PANEL
ALT: 56 U/L — ABNORMAL HIGH (ref 0–44)
AST: 22 U/L (ref 15–41)
Albumin: 2.4 g/dL — ABNORMAL LOW (ref 3.5–5.0)
Alkaline Phosphatase: 51 U/L (ref 38–126)
Anion gap: 6 (ref 5–15)
BUN: 36 mg/dL — ABNORMAL HIGH (ref 8–23)
CO2: 29 mmol/L (ref 22–32)
Calcium: 9.8 mg/dL (ref 8.9–10.3)
Chloride: 111 mmol/L (ref 98–111)
Creatinine, Ser: 1.17 mg/dL — ABNORMAL HIGH (ref 0.44–1.00)
GFR, Estimated: 49 mL/min — ABNORMAL LOW (ref >60)
Glucose, Bld: 124 mg/dL — ABNORMAL HIGH (ref 70–99)
Potassium: 3.8 mmol/L (ref 3.5–5.1)
Sodium: 146 mmol/L — ABNORMAL HIGH (ref 135–145)
Total Bilirubin: 0.7 mg/dL (ref 0.3–1.2)
Total Protein: 5.7 g/dL — ABNORMAL LOW (ref 6.5–8.1)

## 2020-08-12 LAB — MAGNESIUM: Magnesium: 2 mg/dL (ref 1.7–2.4)

## 2020-08-12 LAB — PHOSPHORUS: Phosphorus: 2.9 mg/dL (ref 2.5–4.6)

## 2020-08-12 MED ORDER — CHLORHEXIDINE GLUCONATE CLOTH 2 % EX PADS
6.0000 | MEDICATED_PAD | Freq: Every day | CUTANEOUS | Status: DC
  Administered 2020-08-12 – 2020-08-26 (×15): 6 via TOPICAL

## 2020-08-12 NOTE — Progress Notes (Signed)
SLP Cancellation Note  Patient Details Name: Courtney Anderson MRN: [id number] DOB: 1956-06-25   Cancelled treatment:       Reason Eval/Treat Not Completed: Medical issues which prohibited therapy; Per MD notes, patient re-intubated on 10/8 and remains on the vent today. Skilled ST services not appropriate at this time. Will f/u if/when medical status improves.   Loni Beckwith, M.S. CCC-SLP Speech-Language Pathologist  Loni Beckwith 08/12/2020, 9:25 AM

## 2020-08-12 NOTE — Plan of Care (Signed)
Discussed in front of patient plan of care for the evening, pain management and heart rate staying under 115 with no evidence of learning.

## 2020-08-12 NOTE — Progress Notes (Signed)
[place] for Electrolyte Monitoring and Replacement   Recent Labs: Potassium (mmol/L)  Date Value  08/12/2020 3.8   Magnesium (mg/dL)  Date Value  08/12/2020 2.0   Calcium (mg/dL)  Date Value  08/12/2020 9.8   Calcium, Total (PTH) (mg/dL)  Date Value  07/24/2020 10.1   Albumin (g/dL)  Date Value  08/12/2020 2.4 (L)  10/22/2019 4.2   Phosphorus (mg/dL)  Date Value  08/12/2020 2.9   Sodium (mmol/L)  Date Value  08/12/2020 146 (H)  10/22/2019 139    Assessment: 64 year old female with PMHx of HTN, HLD who was admitted with COVID-19 PNA, also with A-fib with RVR and severe intertrigo.  Goal of Therapy:  Potassium 4.0 - 5.1 mmol/L Magnesium 2.0 - 2.4 mg/dL All Other Electrolytes WNL  Plan:   Electrolytes WNL. No replacement indicated.  F/U electrolytes in am  Tawnya Crook ,PharmD Clinical Pharmacist 08/12/2020 7:17 AM

## 2020-08-12 NOTE — Progress Notes (Signed)
CRITICAL CARE NOTE Severe respiratory distress and patient with COVID-19 pneumonia.  Courtney Anderson Courtney Anderson an 64 y.o.female.  OIN:[facility] is a 64 year old female with history noted below, who was admitted to [facility] on 2020-07-24 brought in via EMS as a "code sepsis". Patient subsequently tested positive for COVID-19. She presented with oxygen saturations of 69% on room air and had to be placed on nonrebreather mask to achieve oxygen saturations in the 90s. She was subsequently admitted to the Covid unit for management of COVID-19 pneumonia. She was noted to have acute kidney injury she was treated with remdesivir, zinc, vitamin C and antitussives. She was placed empirically on Rocephin and azithromycin. She continued to have increased FiO2 requirements. She was refusing to self prone. She had atrial fibrillation and was seen by cardiologyto assist in management.   07/31/20- patient is on 90% FiO2, will modify meds as patient is on metoprolol and levophed as well as cardizem. Will d/c propofol to improve SBP and remove arythmogenic vasopressor if able. Plan to have CVP trending and diuresis today. Goals of care discussion today - patient made DNR per sister and son of patient. I had 2 separate phone meetings with son and separate conversation with sister today to explain patient is worse with shock and renal failure requiring multiple pressors.   08/01/20- patient remains critically ill. FiO2 has been weaned down to 80% today.  08/03/20- patient remains critically ill shes being proned, weaning FiO2 as able.  08/04/20-patient with no interval changes continue with weaning and proning protocol. Continue weaning FiO2 and PEEP as to optimize for SBT 08/06/20- patient is weaned down 40%FiO2 today, will attempt awakening trial with SBT, I spoke to son [name] today, he appreciates update and does not want Elink video view at this time.  08/07/20- patient is requiring 50%FiO2 today 08/08/20-  Continuing to wean vent. FiO2 is 28%, will trial SBT today 08/09/20-tachypneic and hypertensive with SBT. Asynchronous.FiO2 back up to 50% 10/7 severeresp failure, attempt SAT/SBT 10/7 extubated to biPAP 10/8 Tachycardic , more confused,patient re-intubated ETT 8.0 10/9 remains on vent    CC  follow up respiratory failure  SUBJECTIVE Patient remains critically ill Prognosis is guarded Remains on vent Dx of aspiration afib with RVR started on Digoxin    Vent Mode: PRVC FiO2 (%):  [40 %] 40 % Set Rate:  [16 bmp] 16 bmp Vt Set:  [430 mL] 430 mL PEEP:  [5 cmH20] 5 cmH20 Plateau Pressure:  [26 cmH20] 26 cmH20  CBC    Component Value Date/Time   WBC 7.4 08/12/2020 0523   RBC 3.52 (L) 08/12/2020 0523   HGB 10.0 (L) 08/12/2020 0523   HGB 12.4 05/13/2018 1134   HCT 32.2 (L) 08/12/2020 0523   HCT 37.7 05/13/2018 1134   PLT 153 08/12/2020 0523   PLT 219 05/13/2018 1134   MCV 91.5 08/12/2020 0523   MCV 73 (L) 05/13/2018 1134   MCH 28.4 08/12/2020 0523   MCHC 31.1 08/12/2020 0523   RDW 17.1 (H) 08/12/2020 0523   RDW 13.3 05/13/2018 1134   LYMPHSABS 0.8 08/12/2020 0523   LYMPHSABS 2.4 05/13/2018 1134   MONOABS 0.5 08/12/2020 0523   EOSABS 0.4 08/12/2020 0523   EOSABS 0.2 05/13/2018 1134   BASOSABS 0.0 08/12/2020 0523   BASOSABS 0.0 05/13/2018 1134   BMP Latest Ref Rng & Units 08/12/2020 08/11/2020 08/10/2020  Glucose 70 - 99 mg/dL 124(H) 132(H) 152(H)  BUN 8 - 23 mg/dL 36(H) 44(H) 53(H)  Creatinine 0.44 - 1.00 mg/dL  1.17(H) 1.04(H) 1.18(H)  BUN/Creat Ratio 12 - 28 - - -  Sodium 135 - 145 mmol/L 146(H) 151(H) 150(H)  Potassium 3.5 - 5.1 mmol/L 3.8 3.5 4.1  Chloride 98 - 111 mmol/L 111 115(H) 116(H)  CO2 22 - 32 mmol/L _0 Calcium 8.9 - 10.3 mg/dL 9.8 9.8 9.5     BP (!) 84/50   Pulse 82   Temp 97.7 F (36.5 C) (Axillary)   Resp 19   Ht 5' 5" (1.651 m)   Wt 109.9 kg   SpO2 95%   BMI 40.32 kg/m    I/O last 3 completed shifts: In: 1981.6 [I.V.:1471.1;  IV Piggyback:510.5] Out: 1610 [Urine:1035] Total I/O In: -  Out: 50 [Urine:50]  SpO2: 95 % O2 Flow Rate (L/min): 4 L/min FiO2 (%): 40 %  Estimated body mass index is 40.32 kg/m as calculated from the following:   Height as of this encounter: 5' 5" (1.651 m).   Weight as of this encounter: 109.9 kg.  SIGNIFICANT EVENTS   REVIEW OF SYSTEMS  PATIENT IS UNABLE TO PROVIDE COMPLETE REVIEW OF SYSTEMS DUE TO SEVERE CRITICAL ILLNESS      COVID-19 DISASTER DECLARATION:   FULL CONTACT PHYSICAL EXAMINATION WAS NOT POSSIBLE DUE TO TREATMENT OF COVID-19  AND CONSERVATION OF PERSONAL PROTECTIVE EQUIPMENT, LIMITED EXAM FINDINGS INCLUDE-   PHYSICAL EXAMINATION:  GENERAL:critically ill appearing, +resp distress NEUROLOGIC: obtunded, GCS<8   Patient assessed or the symptoms described in the history of present illness.  In the context of the Global COVID-19 pandemic, which necessitated consideration that the patient might be at risk for infection with the SARS-CoV-2 virus that causes COVID-19, Institutional protocols and algorithms that pertain to the evaluation of patients at risk for COVID-19 are in a state of rapid change based on information released by regulatory bodies including the CDC and federal and state organizations. These policies and algorithms were followed during the patient's care while in hospital.    MEDICATIONS: I have reviewed all medications and confirmed regimen as documented     IMAGING    DG Abd 1 View  Result Date: 08/11/2020 CLINICAL DATA:  OG tube placement EXAM: ABDOMEN - 1 VIEW COMPARISON:  Radiograph 08/01/2020 FINDINGS: Transesophageal tube tip and side port distal to the GE junction, terminating at the level of the gastric antrum/proximal duodenum bulb. Persistent heterogeneous airspace opacities present in the lung bases likely with associated atelectasis and trace effusions. Multiple air-filled loops of small bowel and colon without frank dilatation  or high-grade obstructive pattern. No suspicious calcifications. Likely edematous changes in the soft tissues. No acute osseous abnormality. IMPRESSION: 1. Transesophageal tube tip and side port distal to the GE junction, terminating at the level of the gastric antrum/proximal duodenum bulb. 2. Persistent bibasilar heterogeneous airspace opacities likely with atelectasis and trace effusions. Electronically Signed   By: Lovena Le M.D.   On: 08/11/2020 19:22   DG Chest Port 1 View  Result Date: 08/11/2020 CLINICAL DATA:  64 year old female with respiratory failure. EXAM: PORTABLE CHEST 1 VIEW COMPARISON:  Chest radiograph dated 08/20/2020. FINDINGS: Endotracheal tube with tip in the proximal right mainstem bronchus. Recommend retraction by approximately 4 cm. Left IJ central venous line in similar position. Bilateral pulmonary densities, right greater left, appears similar to prior radiograph. Small bilateral pleural effusions may be present. No pneumothorax. Stable cardiac silhouette. No acute osseous pathology. IMPRESSION: 1. Endotracheal tube with tip in the proximal right mainstem bronchus. Recommend retraction by approximately 4 cm. 2. No significant interval  change in bilateral pulmonary opacities. These results were called by telephone at the time of interpretation on 08/11/2020 at 3:39 pm to provider Adalberto Ill, who verbally acknowledged these results. Electronically Signed   By: Anner Crete M.D.   On: 08/11/2020 15:50     Nutrition Status: Nutrition Problem: Inadequate oral intake Etiology: inability to eat Signs/Symptoms: NPO status Interventions: Tube feeding, MVI, Prostat     Indwelling Urinary Catheter continued, requirement due to   Reason to continue Indwelling Urinary Catheter strict Intake/Output monitoring for hemodynamic instability   Central Line/ continued, requirement due to  Reason to continue [place] of central venous pressure or other hemodynamic  parameters and poor IV access   Ventilator continued, requirement due to severe respiratory failure   Ventilator Sedation RASS 0 to -2      ASSESSMENT AND PLAN SYNOPSIS   Acute hypoxemic respiratory failure due to COVID-19 pneumonia / ARDS complicated by renal failure s/p extubation and re-intubated for acute aspiration pneumonia   Severe ACUTE Hypoxic and Hypercapnic Respiratory Failure -continue Full MV support -continue Bronchodilator Therapy -Wean Fio2 and PEEP as tolerated -will perform SAT/SBT when respiratory parameters are met -VAP/VENT bundle implementation  ACUTE DIASTOLIC CARDIAC FAILURE- Afib with RVR -oxygen as needed -Lasix as tolerated -follow up cardiology recs   Morbid obesity, possible OSA.   Will certainly impact respiratory mechanics, ventilator weaning Suspect will need to consider additional PEEP   ACUTE KIDNEY INJURY/Renal Failure -continue Foley Catheter-assess need -Avoid nephrotoxic agents -Follow urine output, BMP -Ensure adequate renal perfusion, optimize oxygenation -Renal dose medications     NEUROLOGY - intubated and sedated - minimal sedation to achieve a RASS goal: -1  CARDIAC ICU monitoring  ID -continue IV abx as prescibed -follow up cultures  GI GI PROPHYLAXIS as indicated   DIET-->OG to suction Constipation protocol as indicated  ENDO - will use ICU hypoglycemic\Hyperglycemia protocol if indicated     ELECTROLYTES -follow labs as needed -replace as needed -pharmacy consultation and following   DVT/GI PRX ordered and assessed TRANSFUSIONS AS NEEDED MONITOR FSBS I Assessed the need for Labs I Assessed the need for Foley I Assessed the need for Central Venous Line Family Discussion when available I Assessed the need for Mobilization I made an Assessment of medications to be adjusted accordingly Safety Risk assessment completed   CASE DISCUSSED IN MULTIDISCIPLINARY ROUNDS WITH ICU TEAM  Critical  Care Time devoted to patient care services described in this note is 34 minutes.   Overall, patient is critically ill, prognosis is guarded.  Patient with Multiorgan failure and at high risk for cardiac arrest and death.    Corrin Parker, M.D.  [facility]  Medical Director [place] Director [facility] Cardio-Pulmonary Department

## 2020-08-12 NOTE — Progress Notes (Signed)
Assisted tele visit to patient with family member.  Claritza July R, RN  

## 2020-08-12 NOTE — Progress Notes (Signed)
[facility]  SUBJECTIVE: Patient intubated   Vitals:   08/12/20 0630 08/12/20 0645 08/12/20 0700 08/12/20 0800  BP: 124/72 (!) 89/64 (!) 84/50   Pulse: 95 82    Resp: (!) 23 (!) 29 19   Temp:    97.7 F (36.5 C)  TempSrc:    Axillary  SpO2: 99% 95%    Weight:      Height:         Intake/Output Summary (Last 24 hours) at 08/12/2020 0908 Last data filed at 08/12/2020 0800 Gross per 24 hour  Intake 1744.03 ml  Output 565 ml  Net 1179.03 ml      PHYSICAL EXAM  General: Well developed, well nourished, in no acute distress HEENT:  Normocephalic and atramatic Neck:  No JVD.  Lungs: Clear bilaterally to auscultation and percussion. Heart: HRRR . Normal S1 and S2 without gallops or murmurs.  Abdomen: Bowel sounds are positive, abdomen soft and non-tender  Msk:  Back normal, normal gait. Normal strength and tone for age. Extremities: No clubbing, cyanosis or edema.   Neuro: Alert and oriented X 3. Psych:  Good affect, responds appropriately   LABS: Basic Metabolic Panel: Recent Labs    08/11/20 0304 08/12/20 0523  NA 151* 146*  K 3.5 3.8  CL 115* 111  CO2 25 29  GLUCOSE 132* 124*  BUN 44* 36*  CREATININE 1.04* 1.17*  CALCIUM 9.8 9.8  MG 1.9 2.0  PHOS 2.4* 2.9   Liver Function Tests: Recent Labs    08/11/20 0304 08/12/20 0523  AST 43* 22  ALT 80* 56*  ALKPHOS 47 51  BILITOT 0.8 0.7  PROT 5.8* 5.7*  ALBUMIN 2.5* 2.4*   No results for input(s): LIPASE, AMYLASE in the last 72 hours. CBC: Recent Labs    08/11/20 0304 08/12/20 0523  WBC 7.8 7.4  NEUTROABS 6.4 5.8  HGB 11.2* 10.0*  HCT 35.6* 32.2*  MCV 88.8 91.5  PLT 137* 153   Cardiac Enzymes: No results for input(s): CKTOTAL, CKMB, CKMBINDEX, TROPONINI in the last 72 hours. BNP: Invalid input(s): POCBNP D-Dimer: No results for input(s): DDIMER in the last 72 hours. Hemoglobin A1C: No results for input(s): HGBA1C in the last 72 hours. Fasting Lipid Panel: Recent Labs    08/11/20 0304   TRIG 83   Thyroid Function Tests: No results for input(s): TSH, T4TOTAL, T3FREE, THYROIDAB in the last 72 hours.  Invalid input(s): FREET3 Anemia Panel: No results for input(s): VITAMINB12, FOLATE, FERRITIN, TIBC, IRON, RETICCTPCT in the last 72 hours.  DG Abd 1 View  Result Date: 08/11/2020 CLINICAL DATA:  OG tube placement EXAM: ABDOMEN - 1 VIEW COMPARISON:  Radiograph 08/01/2020 FINDINGS: Transesophageal tube tip and side port distal to the GE junction, terminating at the level of the gastric antrum/proximal duodenum bulb. Persistent heterogeneous airspace opacities present in the lung bases likely with associated atelectasis and trace effusions. Multiple air-filled loops of small bowel and colon without frank dilatation or high-grade obstructive pattern. No suspicious calcifications. Likely edematous changes in the soft tissues. No acute osseous abnormality. IMPRESSION: 1. Transesophageal tube tip and side port distal to the GE junction, terminating at the level of the gastric antrum/proximal duodenum bulb. 2. Persistent bibasilar heterogeneous airspace opacities likely with atelectasis and trace effusions. Electronically Signed   By: Lovena Le M.D.   On: 08/11/2020 19:22   DG Chest Port 1 View  Result Date: 08/11/2020 CLINICAL DATA:  64 year old female with respiratory failure. EXAM: PORTABLE CHEST 1 VIEW COMPARISON:  Chest  radiograph dated 08/20/2020. FINDINGS: Endotracheal tube with tip in the proximal right mainstem bronchus. Recommend retraction by approximately 4 cm. Left IJ central venous line in similar position. Bilateral pulmonary densities, right greater left, appears similar to prior radiograph. Small bilateral pleural effusions may be present. No pneumothorax. Stable cardiac silhouette. No acute osseous pathology. IMPRESSION: 1. Endotracheal tube with tip in the proximal right mainstem bronchus. Recommend retraction by approximately 4 cm. 2. No significant interval change in  bilateral pulmonary opacities. These results were called by telephone at the time of interpretation on 08/11/2020 at 3:39 pm to provider Adalberto Ill, who verbally acknowledged these results. Electronically Signed   By: Anner Crete M.D.   On: 08/11/2020 15:50     Echo LVEF 45 to 50%  TELEMETRY: Atrial fibrillation at 116 bpm:  ASSESSMENT AND PLAN:  Principal Problem:   Sepsis ([place]) Active Problems:   Hypertension   Morbid obesity ([place])   Hypercalcemia   Hyperthyroidism   AKI (acute kidney injury) ([place])   COVID-19 virus infection   Atrial fibrillation with RVR (HCC)   Hypokalemia    1.  Persistent atrial fibrillation with rapid ventricular rate, exacerbated by probable aspiration pneumonia and recurrent respiratory failure with intubation, on amiodarone drip, improved after addition of IV digoxin 2.  COVID-19 pneumonia/sepsis, extubated, followed by vomiting and probable aspiration pneumonia with recurrent respiratory failure requiring reintubation  Recommendations  1.  Agree with current therapy 2.  Continue amiodarone infusion 3.  Continue digoxin 0.25 mg IV daily 4.  Continue Eliquis 5 mg twice daily per NG tube   Isaias Cowman, MD, PhD, [facility] 08/12/2020 9:08 AM

## 2020-08-13 DIAGNOSIS — U071 COVID-19: Secondary | ICD-10-CM | POA: Diagnosis not present

## 2020-08-13 LAB — CBC WITH DIFFERENTIAL/PLATELET
Abs Immature Granulocytes: 0.03 10*3/uL (ref 0.00–0.07)
Basophils Absolute: 0 10*3/uL (ref 0.0–0.1)
Basophils Relative: 0 %
Eosinophils Absolute: 0.5 10*3/uL (ref 0.0–0.5)
Eosinophils Relative: 8 %
HCT: 34.3 % — ABNORMAL LOW (ref 36.0–46.0)
Hemoglobin: 10.5 g/dL — ABNORMAL LOW (ref 12.0–15.0)
Immature Granulocytes: 1 %
Lymphocytes Relative: 11 %
Lymphs Abs: 0.7 10*3/uL (ref 0.7–4.0)
MCH: 27.8 pg (ref 26.0–34.0)
MCHC: 30.6 g/dL (ref 30.0–36.0)
MCV: 90.7 fL (ref 80.0–100.0)
Monocytes Absolute: 0.4 10*3/uL (ref 0.1–1.0)
Monocytes Relative: 7 %
Neutro Abs: 4.7 10*3/uL (ref 1.7–7.7)
Neutrophils Relative %: 73 %
Platelets: 144 10*3/uL — ABNORMAL LOW (ref 150–400)
RBC: 3.78 MIL/uL — ABNORMAL LOW (ref 3.87–5.11)
RDW: 17 % — ABNORMAL HIGH (ref 11.5–15.5)
WBC: 6.4 10*3/uL (ref 4.0–10.5)
nRBC: 0 % (ref 0.0–0.2)

## 2020-08-13 LAB — COMPREHENSIVE METABOLIC PANEL
ALT: 42 U/L (ref 0–44)
AST: 18 U/L (ref 15–41)
Albumin: 2.3 g/dL — ABNORMAL LOW (ref 3.5–5.0)
Alkaline Phosphatase: 49 U/L (ref 38–126)
Anion gap: 9 (ref 5–15)
BUN: 27 mg/dL — ABNORMAL HIGH (ref 8–23)
CO2: 28 mmol/L (ref 22–32)
Calcium: 9.3 mg/dL (ref 8.9–10.3)
Chloride: 110 mmol/L (ref 98–111)
Creatinine, Ser: 0.93 mg/dL (ref 0.44–1.00)
GFR, Estimated: >60 mL/min (ref >60)
Glucose, Bld: 99 mg/dL (ref 70–99)
Potassium: 3.2 mmol/L — ABNORMAL LOW (ref 3.5–5.1)
Sodium: 147 mmol/L — ABNORMAL HIGH (ref 135–145)
Total Bilirubin: 0.5 mg/dL (ref 0.3–1.2)
Total Protein: 5.4 g/dL — ABNORMAL LOW (ref 6.5–8.1)

## 2020-08-13 LAB — BLOOD GAS, VENOUS
Acid-Base Excess: 4.5 mmol/L — ABNORMAL HIGH (ref 0.0–2.0)
Bicarbonate: 32.2 mmol/L — ABNORMAL HIGH (ref 20.0–28.0)
FIO2: 0.4
O2 Saturation: 78.7 %
Patient temperature: 37
pCO2, Ven: 57 mmHg (ref 44.0–60.0)
pH, Ven: 7.36 (ref 7.250–7.430)
pO2, Ven: 45 mmHg (ref 32.0–45.0)

## 2020-08-13 LAB — GLUCOSE, CAPILLARY
Glucose-Capillary: 90 mg/dL (ref 70–99)
Glucose-Capillary: 93 mg/dL (ref 70–99)
Glucose-Capillary: 92 mg/dL (ref 70–99)
Glucose-Capillary: 106 mg/dL — ABNORMAL HIGH (ref 70–99)
Glucose-Capillary: 120 mg/dL — ABNORMAL HIGH (ref 70–99)
Glucose-Capillary: 107 mg/dL — ABNORMAL HIGH (ref 70–99)

## 2020-08-13 LAB — PHOSPHORUS: Phosphorus: 2.7 mg/dL (ref 2.5–4.6)

## 2020-08-13 LAB — MAGNESIUM: Magnesium: 1.9 mg/dL (ref 1.7–2.4)

## 2020-08-13 MED ORDER — MAGNESIUM SULFATE 2 GM/50ML IV SOLN: 2.0000 g | Freq: Once | INTRAVENOUS | Status: DC

## 2020-08-13 MED ORDER — POTASSIUM CHLORIDE 20 MEQ/15ML (10%) PO SOLN
20.0000 meq | ORAL | Status: AC
  Administered 2020-08-13 (×2): 20 meq
  Filled 2020-08-13 (×2): qty 15

## 2020-08-13 MED ORDER — POTASSIUM CHLORIDE 10 MEQ/50ML IV SOLN
10.0000 meq | INTRAVENOUS | Status: AC
  Administered 2020-08-13 (×3): 10 meq via INTRAVENOUS
  Filled 2020-08-13 (×4): qty 50

## 2020-08-13 NOTE — Progress Notes (Signed)
[place] for Electrolyte Monitoring and Replacement   Recent Labs: Potassium (mmol/L)  Date Value  08/13/2020 3.2 (L)   Magnesium (mg/dL)  Date Value  08/13/2020 1.9   Calcium (mg/dL)  Date Value  08/13/2020 9.3   Calcium, Total (PTH) (mg/dL)  Date Value  07/24/2020 10.1   Albumin (g/dL)  Date Value  08/13/2020 2.3 (L)  10/22/2019 4.2   Phosphorus (mg/dL)  Date Value  08/13/2020 2.7   Sodium (mmol/L)  Date Value  08/13/2020 147 (H)  10/22/2019 139    Assessment: 64 year old female with PMHx of HTN, HLD who was admitted with COVID-19 PNA, also with A-fib with RVR and severe intertrigo.  Goal of Therapy:  Potassium 4.0 - 5.1 mmol/L Magnesium 2.0 - 2.4 mg/dL All Other Electrolytes WNL  Plan:   Potassium 10 mEq IV x 4 + potassium 20 mEq per tube x 2 + mag 2 g IV x 1  F/U electrolytes in am  Tawnya Crook ,PharmD Clinical Pharmacist 08/13/2020 7:51 AM

## 2020-08-13 NOTE — Progress Notes (Signed)
  SHIFT SUMMARY  -Patient remains ventilated on Amiodarone gtt  -Patient responds to pain, unable to follow commands

## 2020-08-13 NOTE — Progress Notes (Signed)
[facility]  SUBJECTIVE: Patient intubated   Vitals:   08/13/20 0500 08/13/20 0600 08/13/20 0700 08/13/20 0800  BP: 125/79 (!) 109/58 105/77 111/87  Pulse: 90 98 88 90  Resp: (!) 24 (!) 22 (!) 26 (!) 27  Temp:    98 F (36.7 C)  TempSrc:    Axillary  SpO2: 100% 100% 100% 100%  Weight:      Height:         Intake/Output Summary (Last 24 hours) at 08/13/2020 3875 Last data filed at 08/13/2020 0800 Gross per 24 hour  Intake 1482.84 ml  Output 970 ml  Net 512.84 ml      PHYSICAL EXAM  General: Intubated HEENT:  Normocephalic and atramatic Neck:  No JVD.  Lungs: Clear bilaterally to auscultation and percussion. Heart: HRRR . Normal S1 and S2 without gallops or murmurs.  Abdomen: Bowel sounds are positive, abdomen soft and non-tender  Msk:  Back normal, normal gait. Normal strength and tone for age. Extremities: No clubbing, cyanosis or edema.   Neuro: Intubated Psych: Intubated   LABS: Basic Metabolic Panel: Recent Labs    08/12/20 0523 08/13/20 0405  NA 146* 147*  K 3.8 3.2*  CL 111 110  CO2 29 28  GLUCOSE 124* 99  BUN 36* 27*  CREATININE 1.17* 0.93  CALCIUM 9.8 9.3  MG 2.0 1.9  PHOS 2.9 2.7   Liver Function Tests: Recent Labs    08/12/20 0523 08/13/20 0405  AST 22 18  ALT 56* 42  ALKPHOS 51 49  BILITOT 0.7 0.5  PROT 5.7* 5.4*  ALBUMIN 2.4* 2.3*   No results for input(s): LIPASE, AMYLASE in the last 72 hours. CBC: Recent Labs    08/12/20 0523 08/13/20 0405  WBC 7.4 6.4  NEUTROABS 5.8 4.7  HGB 10.0* 10.5*  HCT 32.2* 34.3*  MCV 91.5 90.7  PLT 153 144*   Cardiac Enzymes: No results for input(s): CKTOTAL, CKMB, CKMBINDEX, TROPONINI in the last 72 hours. BNP: Invalid input(s): POCBNP D-Dimer: No results for input(s): DDIMER in the last 72 hours. Hemoglobin A1C: No results for input(s): HGBA1C in the last 72 hours. Fasting Lipid Panel: Recent Labs    08/11/20 0304  TRIG 83   Thyroid Function Tests: No results for input(s):  TSH, T4TOTAL, T3FREE, THYROIDAB in the last 72 hours.  Invalid input(s): FREET3 Anemia Panel: No results for input(s): VITAMINB12, FOLATE, FERRITIN, TIBC, IRON, RETICCTPCT in the last 72 hours.  DG Abd 1 View  Result Date: 08/11/2020 CLINICAL DATA:  OG tube placement EXAM: ABDOMEN - 1 VIEW COMPARISON:  Radiograph 08/01/2020 FINDINGS: Transesophageal tube tip and side port distal to the GE junction, terminating at the level of the gastric antrum/proximal duodenum bulb. Persistent heterogeneous airspace opacities present in the lung bases likely with associated atelectasis and trace effusions. Multiple air-filled loops of small bowel and colon without frank dilatation or high-grade obstructive pattern. No suspicious calcifications. Likely edematous changes in the soft tissues. No acute osseous abnormality. IMPRESSION: 1. Transesophageal tube tip and side port distal to the GE junction, terminating at the level of the gastric antrum/proximal duodenum bulb. 2. Persistent bibasilar heterogeneous airspace opacities likely with atelectasis and trace effusions. Electronically Signed   By: Lovena Le M.D.   On: 08/11/2020 19:22   DG Chest Port 1 View  Result Date: 08/11/2020 CLINICAL DATA:  64 year old female with respiratory failure. EXAM: PORTABLE CHEST 1 VIEW COMPARISON:  Chest radiograph dated 08/20/2020. FINDINGS: Endotracheal tube with tip in the proximal right mainstem bronchus.  Recommend retraction by approximately 4 cm. Left IJ central venous line in similar position. Bilateral pulmonary densities, right greater left, appears similar to prior radiograph. Small bilateral pleural effusions may be present. No pneumothorax. Stable cardiac silhouette. No acute osseous pathology. IMPRESSION: 1. Endotracheal tube with tip in the proximal right mainstem bronchus. Recommend retraction by approximately 4 cm. 2. No significant interval change in bilateral pulmonary opacities. These results were called by  telephone at the time of interpretation on 08/11/2020 at 3:39 pm to provider Adalberto Ill, who verbally acknowledged these results. Electronically Signed   By: Anner Crete M.D.   On: 08/11/2020 15:50     Echo LVEF 45 to 50%  TELEMETRY: Atrial fibrillation 110 to 120 bpm:  ASSESSMENT AND PLAN:  Principal Problem:   Sepsis ([place]) Active Problems:   Hypertension   Morbid obesity (HCC)   Hypercalcemia   Hyperthyroidism   AKI (acute kidney injury) ([place])   COVID-19 virus infection   Atrial fibrillation with RVR (HCC)   Hypokalemia    1.   Persistent atrial fibrillation with rapid ventricular rate, exacerbated by probable aspiration pneumonia and recurrent respiratory failure with intubation, on amiodarone drip, improved after addition of IV digoxin 2.  COVID-19 pneumonia/sepsis, extubated, followed by vomiting and probable aspiration pneumonia with recurrent respiratory failure requiring reintubation  Recommendations  1.  Agree with current therapy 2.  Continue amiodarone infusion 3.  Continue digoxin 0.25 mg IV daily 4.  Continue Eliquis 5 mg twice daily per NG tube   Isaias Cowman, MD, PhD, [facility] 08/13/2020 8:23 AM

## 2020-08-13 NOTE — Progress Notes (Signed)
CRITICAL CARE NOTE Severe respiratory distress and patient with COVID-19 pneumonia.  Courtney Anderson an 64 y.o.female.  OVF:IEPPIRJ is a 64 year old female with history noted below, who was admitted to [facility] on 2020-07-24 brought in via EMS as a "code sepsis". Patient subsequently tested positive for COVID-19. She presented with oxygen saturations of 69% on room air and had to be placed on nonrebreather mask to achieve oxygen saturations in the 90s. She was subsequently admitted to the Covid unit for management of COVID-19 pneumonia. She was noted to have acute kidney injury she was treated with remdesivir, zinc, vitamin C and antitussives. She was placed empirically on Rocephin and azithromycin. She continued to have increased FiO2 requirements. She was refusing to self prone. She had atrial fibrillation and was seen by cardiologyto assist in management.   07/31/20- patient is on 90% FiO2, will modify meds as patient is on metoprolol and levophed as well as cardizem. Will d/c propofol to improve SBP and remove arythmogenic vasopressor if able. Plan to have CVP trending and diuresis today. Goals of care discussion today - patient made DNR per sister and son of patient. I had 2 separate phone meetings with son and separate conversation with sister today to explain patient is worse with shock and renal failure requiring multiple pressors.   08/01/20- patient remains critically ill. FiO2 has been weaned down to 80% today.  08/03/20- patient remains critically ill shes being proned, weaning FiO2 as able.  08/04/20-patient with no interval changes continue with weaning and proning protocol. Continue weaning FiO2 and PEEP as to optimize for SBT 08/06/20- patient is weaned down 40%FiO2 today, will attempt awakening trial with SBT, I spoke to son [name] today, he appreciates update and does not want Elink video view at this time.  08/07/20- patient is requiring 50%FiO2 today 08/08/20-  Continuing to wean vent. FiO2 is 28%, will trial SBT today 08/09/20-tachypneic and hypertensive with SBT. Asynchronous.FiO2 back up to 50% 10/7 severeresp failure, attempt SAT/SBT 10/7 extubated to biPAP 10/8 Tachycardic , more confused,patient re-intubated ETT 8.0 10/9 remains on vent    CC  follow up respiratory failure  SUBJECTIVE Patient remains critically ill Prognosis is guarded  Vent Mode: PRVC FiO2 (%):  [40 %] 40 % Set Rate:  [16 bmp] 16 bmp Vt Set:  [430 mL] 430 mL PEEP:  [5 cmH20] 5 cmH20 Plateau Pressure:  [17 cmH20] 17 cmH20  CBC    Component Value Date/Time   WBC 6.4 08/13/2020 0405   RBC 3.78 (L) 08/13/2020 0405   HGB 10.5 (L) 08/13/2020 0405   HGB 12.4 05/13/2018 1134   HCT 34.3 (L) 08/13/2020 0405   HCT 37.7 05/13/2018 1134   PLT 144 (L) 08/13/2020 0405   PLT 219 05/13/2018 1134   MCV 90.7 08/13/2020 0405   MCV 73 (L) 05/13/2018 1134   MCH 27.8 08/13/2020 0405   MCHC 30.6 08/13/2020 0405   RDW 17.0 (H) 08/13/2020 0405   RDW 13.3 05/13/2018 1134   LYMPHSABS 0.7 08/13/2020 0405   LYMPHSABS 2.4 05/13/2018 1134   MONOABS 0.4 08/13/2020 0405   EOSABS 0.5 08/13/2020 0405   EOSABS 0.2 05/13/2018 1134   BASOSABS 0.0 08/13/2020 0405   BASOSABS 0.0 05/13/2018 1134   BMP Latest Ref Rng & Units 08/13/2020 08/12/2020 08/11/2020  Glucose 70 - 99 mg/dL 99 124(H) 132(H)  BUN 8 - 23 mg/dL 27(H) 36(H) 44(H)  Creatinine 0.44 - 1.00 mg/dL 0.93 1.17(H) 1.04(H)  BUN/Creat Ratio 12 - 28 - - -  Sodium 135 - 145 mmol/L 147(H) 146(H) 151(H)  Potassium 3.5 - 5.1 mmol/L 3.2(L) 3.8 3.5  Chloride 98 - 111 mmol/L 110 111 115(H)  CO2 22 - 32 mmol/L _0 Calcium 8.9 - 10.3 mg/dL 9.3 9.8 9.8    BP 105/77    Pulse 88    Temp 97.8 F (36.6 C) (Axillary)    Resp (!) 26    Ht _1  (1.651 m)    Wt 109.9 kg    SpO2 100%    BMI 40.32 kg/m    I/O last 3 completed shifts: In: 2179.2 [I.V.:1929.2; IV Piggyback:249.9] Out: 3893 [Urine:745; Emesis/NG output:400] No  intake/output data recorded.  SpO2: 100 % O2 Flow Rate (L/min): 4 L/min FiO2 (%): 40 %  Estimated body mass index is 40.32 kg/m as calculated from the following:   Height as of this encounter: _2  (1.651 m).   Weight as of this encounter: 109.9 kg.  SIGNIFICANT EVENTS   REVIEW OF SYSTEMS  PATIENT IS UNABLE TO PROVIDE COMPLETE REVIEW OF SYSTEMS DUE TO SEVERE CRITICAL ILLNESS      COVID-19 DISASTER DECLARATION:   FULL CONTACT PHYSICAL EXAMINATION WAS NOT POSSIBLE DUE TO TREATMENT OF COVID-19  AND CONSERVATION OF PERSONAL PROTECTIVE EQUIPMENT, LIMITED EXAM FINDINGS INCLUDE-   PHYSICAL EXAMINATION:  GENERAL:critically ill appearing, +resp distress NEUROLOGIC: obtunded, GCS<8   Patient assessed or the symptoms described in the history of present illness.  In the context of the Global COVID-19 pandemic, which necessitated consideration that the patient might be at risk for infection with the SARS-CoV-2 virus that causes COVID-19, Institutional protocols and algorithms that pertain to the evaluation of patients at risk for COVID-19 are in a state of rapid change based on information released by regulatory bodies including the CDC and federal and state organizations. These policies and algorithms were followed during the patient's care while in hospital.    MEDICATIONS: I have reviewed all medications and confirmed regimen as documented     Nutrition Status: Nutrition Problem: Inadequate oral intake Etiology: inability to eat Signs/Symptoms: NPO status Interventions: Tube feeding, MVI, Prostat     Indwelling Urinary Catheter continued, requirement due to   Reason to continue Indwelling Urinary Catheter strict Intake/Output monitoring for hemodynamic instability   Central Line/ continued, requirement due to  Reason to continue [place] of central venous pressure or other hemodynamic parameters and poor IV access   Ventilator continued, requirement due  to severe respiratory failure   Ventilator Sedation RASS 0 to -2      ASSESSMENT AND PLAN SYNOPSIS Acute hypoxemic respiratory failure due to COVID-19 pneumonia / ARDS complicated by renal failure s/p extubation and re-intubated for acute aspiration pneumonia  Severe ACUTE Hypoxic and Hypercapnic Respiratory Failure -continue Full MV support -continue Bronchodilator Therapy -Wean Fio2 and PEEP as tolerated -will perform SAT/SBT when respiratory parameters are met -VAP/VENT bundle implementation  Morbid obesity, possible OSA.   Will certainly impact respiratory mechanics, ventilator weaning Suspect will need to consider additional PEEP    NEUROLOGY - intubated and sedated - minimal sedation to achieve a RASS goal: -1 Wake up assessment pending  CARDIAC ICU monitoring  ID -continue IV abx as prescibed -follow up cultures  GI GI PROPHYLAXIS as indicated   DIET-->TF's as tolerated Constipation protocol as indicated  ENDO - will use ICU hypoglycemic\Hyperglycemia protocol if indicated     ELECTROLYTES -follow labs as needed -replace as needed -pharmacy consultation and following   DVT/GI PRX ordered and assessed  TRANSFUSIONS AS NEEDED MONITOR FSBS I Assessed the need for Labs I Assessed the need for Foley I Assessed the need for Central Venous Line Family Discussion when available I Assessed the need for Mobilization I made an Assessment of medications to be adjusted accordingly Safety Risk assessment completed   CASE DISCUSSED IN MULTIDISCIPLINARY ROUNDS WITH ICU TEAM  Critical Care Time devoted to patient care services described in this note is 35 minutes.   Overall, patient is critically ill, prognosis is guarded.   Corrin Parker, M.D.  [facility]  Medical Director [place] Director [facility] Cardio-Pulmonary Department

## 2020-08-14 DIAGNOSIS — U071 COVID-19: Secondary | ICD-10-CM | POA: Diagnosis not present

## 2020-08-14 LAB — CBC WITH DIFFERENTIAL/PLATELET
Abs Immature Granulocytes: 0.04 10*3/uL (ref 0.00–0.07)
Basophils Absolute: 0 10*3/uL (ref 0.0–0.1)
Basophils Relative: 0 %
Eosinophils Absolute: 0.3 10*3/uL (ref 0.0–0.5)
Eosinophils Relative: 4 %
HCT: 35.8 % — ABNORMAL LOW (ref 36.0–46.0)
Hemoglobin: 10.8 g/dL — ABNORMAL LOW (ref 12.0–15.0)
Immature Granulocytes: 1 %
Lymphocytes Relative: 7 %
Lymphs Abs: 0.5 10*3/uL — ABNORMAL LOW (ref 0.7–4.0)
MCH: 27.5 pg (ref 26.0–34.0)
MCHC: 30.2 g/dL (ref 30.0–36.0)
MCV: 91.1 fL (ref 80.0–100.0)
Monocytes Absolute: 0.5 10*3/uL (ref 0.1–1.0)
Monocytes Relative: 7 %
Neutro Abs: 5.8 10*3/uL (ref 1.7–7.7)
Neutrophils Relative %: 81 %
Platelets: 158 10*3/uL (ref 150–400)
RBC: 3.93 MIL/uL (ref 3.87–5.11)
RDW: 16.4 % — ABNORMAL HIGH (ref 11.5–15.5)
WBC: 7.1 10*3/uL (ref 4.0–10.5)
nRBC: 0.3 % — ABNORMAL HIGH (ref 0.0–0.2)

## 2020-08-14 LAB — TRIGLYCERIDES: Triglycerides: 86 mg/dL (ref <150)

## 2020-08-14 LAB — MAGNESIUM: Magnesium: 1.8 mg/dL (ref 1.7–2.4)

## 2020-08-14 LAB — GLUCOSE, CAPILLARY
Glucose-Capillary: 101 mg/dL — ABNORMAL HIGH (ref 70–99)
Glucose-Capillary: 103 mg/dL — ABNORMAL HIGH (ref 70–99)
Glucose-Capillary: 107 mg/dL — ABNORMAL HIGH (ref 70–99)
Glucose-Capillary: 109 mg/dL — ABNORMAL HIGH (ref 70–99)
Glucose-Capillary: 113 mg/dL — ABNORMAL HIGH (ref 70–99)
Glucose-Capillary: 91 mg/dL (ref 70–99)

## 2020-08-14 LAB — COMPREHENSIVE METABOLIC PANEL
ALT: 39 U/L (ref 0–44)
AST: 23 U/L (ref 15–41)
Albumin: 2.6 g/dL — ABNORMAL LOW (ref 3.5–5.0)
Alkaline Phosphatase: 55 U/L (ref 38–126)
Anion gap: 10 (ref 5–15)
BUN: 17 mg/dL (ref 8–23)
CO2: 30 mmol/L (ref 22–32)
Calcium: 9.4 mg/dL (ref 8.9–10.3)
Chloride: 106 mmol/L (ref 98–111)
Creatinine, Ser: 0.92 mg/dL (ref 0.44–1.00)
GFR, Estimated: >60 mL/min (ref >60)
Glucose, Bld: 113 mg/dL — ABNORMAL HIGH (ref 70–99)
Potassium: 3.5 mmol/L (ref 3.5–5.1)
Sodium: 146 mmol/L — ABNORMAL HIGH (ref 135–145)
Total Bilirubin: 0.7 mg/dL (ref 0.3–1.2)
Total Protein: 6 g/dL — ABNORMAL LOW (ref 6.5–8.1)

## 2020-08-14 LAB — PHOSPHORUS: Phosphorus: 2.8 mg/dL (ref 2.5–4.6)

## 2020-08-14 MED ORDER — MAGNESIUM SULFATE 2 GM/50ML IV SOLN
2.0000 g | Freq: Once | INTRAVENOUS | Status: AC
  Administered 2020-08-14: 2 g via INTRAVENOUS
  Filled 2020-08-14: qty 50

## 2020-08-14 MED ORDER — POTASSIUM CHLORIDE 20 MEQ PO PACK
40.0000 meq | PACK | Freq: Once | ORAL | Status: AC
  Administered 2020-08-14: 40 meq
  Filled 2020-08-14: qty 2

## 2020-08-14 NOTE — Progress Notes (Signed)
CRITICAL CARE NOTE  Severe respiratory distress and patient with COVID-19 pneumonia.  Courtney Anderson an 64 y.o.female.  OVP:CHEKBTC is a 64 year old female with history noted below, who was admitted to [facility] on 2020-07-24 brought in via EMS as a "code sepsis". Patient subsequently tested positive for COVID-19. She presented with oxygen saturations of 69% on room air and had to be placed on nonrebreather mask to achieve oxygen saturations in the 90s. She was subsequently admitted to the Covid unit for management of COVID-19 pneumonia. She was noted to have acute kidney injury she was treated with remdesivir, zinc, vitamin C and antitussives. She was placed empirically on Rocephin and azithromycin. She continued to have increased FiO2 requirements. She was refusing to self prone. She had atrial fibrillation and was seen by cardiologyto assist in management.   07/31/20- patient is on 90% FiO2, will modify meds as patient is on metoprolol and levophed as well as cardizem. Will d/c propofol to improve SBP and remove arythmogenic vasopressor if able. Plan to have CVP trending and diuresis today. Goals of care discussion today - patient made DNR per sister and son of patient. I had 2 separate phone meetings with son and separate conversation with sister today to explain patient is worse with shock and renal failure requiring multiple pressors.   08/01/20- patient remains critically ill. FiO2 has been weaned down to 80% today.  08/03/20- patient remains critically ill shes being proned, weaning FiO2 as able.  08/04/20-patient with no interval changes continue with weaning and proning protocol. Continue weaning FiO2 and PEEP as to optimize for SBT 08/06/20- patient is weaned down 40%FiO2 today, will attempt awakening trial with SBT, I spoke to son [name] today, he appreciates update and does not want Elink video view at this time.  08/07/20- patient is requiring 50%FiO2 today 08/08/20-  Continuing to wean vent. FiO2 is 28%, will trial SBT today 08/09/20-tachypneic and hypertensive with SBT. Asynchronous.FiO2 back up to 50% 10/7 severeresp failure, attempt SAT/SBT 10/7 extubated to biPAP 10/8 Tachycardic , more confused,patient re-intubated ETT 8.0 10/9 remains on vent 10/11 severe resp failure on vent   CC  follow up respiratory failure  SUBJECTIVE Patient remains critically ill Prognosis is guarded   Vent Mode: PRVC FiO2 (%):  [0.4 %-50 %] 50 % Set Rate:  [16 bmp] 16 bmp Vt Set:  [430 mL] 430 mL PEEP:  [5 cmH20] 5 cmH20 Plateau Pressure:  [17 cmH20-18 cmH20] 18 cmH20  CBC    Component Value Date/Time   WBC 7.1 08/14/2020 0413   RBC 3.93 08/14/2020 0413   HGB 10.8 (L) 08/14/2020 0413   HGB 12.4 05/13/2018 1134   HCT 35.8 (L) 08/14/2020 0413   HCT 37.7 05/13/2018 1134   PLT 158 08/14/2020 0413   PLT 219 05/13/2018 1134   MCV 91.1 08/14/2020 0413   MCV 73 (L) 05/13/2018 1134   MCH 27.5 08/14/2020 0413   MCHC 30.2 08/14/2020 0413   RDW 16.4 (H) 08/14/2020 0413   RDW 13.3 05/13/2018 1134   LYMPHSABS 0.5 (L) 08/14/2020 0413   LYMPHSABS 2.4 05/13/2018 1134   MONOABS 0.5 08/14/2020 0413   EOSABS 0.3 08/14/2020 0413   EOSABS 0.2 05/13/2018 1134   BASOSABS 0.0 08/14/2020 0413   BASOSABS 0.0 05/13/2018 1134   BMP Latest Ref Rng & Units 08/14/2020 08/13/2020 08/12/2020  Glucose 70 - 99 mg/dL 113(H) 99 124(H)  BUN 8 - 23 mg/dL 17 27(H) 36(H)  Creatinine 0.44 - 1.00 mg/dL 0.92 0.93 1.17(H)  BUN/Creat  Ratio 12 - 28 - - -  Sodium 135 - 145 mmol/L 146(H) 147(H) 146(H)  Potassium 3.5 - 5.1 mmol/L 3.5 3.2(L) 3.8  Chloride 98 - 111 mmol/L 106 110 111  CO2 22 - 32 mmol/L _0 Calcium 8.9 - 10.3 mg/dL 9.4 9.3 9.8      BP 120/70   Pulse 69   Temp 99.2 F (37.3 C) (Oral)   Resp (!) 30   Ht 5' 5" (1.651 m)   Wt 109.9 kg   SpO2 100%   BMI 40.32 kg/m    I/O last 3 completed shifts: In: 2006 [I.V.:1629; IV Piggyback:376.9] Out: 3670  [Urine:3270; Emesis/NG output:400] No intake/output data recorded.  SpO2: 100 % O2 Flow Rate (L/min): 4 L/min FiO2 (%): 50 %  Estimated body mass index is 40.32 kg/m as calculated from the following:   Height as of this encounter: 5' 5" (1.651 m).   Weight as of this encounter: 109.9 kg.  SIGNIFICANT EVENTS   REVIEW OF SYSTEMS  PATIENT IS UNABLE TO PROVIDE COMPLETE REVIEW OF SYSTEMS DUE TO SEVERE CRITICAL ILLNESS   Pressure Injury 08/13/20 Buttocks Right;Left B/L buttocks and scacrum, pink,red, and black (Active)  08/13/20 2000  Location: Buttocks  Location Orientation: Right;Left  Staging:   Wound Description (Comments): B/L buttocks and scacrum, pink,red, and black  Present on Admission: No    COVID-19 DISASTER DECLARATION:   FULL CONTACT PHYSICAL EXAMINATION WAS NOT POSSIBLE DUE TO TREATMENT OF COVID-19  AND CONSERVATION OF PERSONAL PROTECTIVE EQUIPMENT, LIMITED EXAM FINDINGS INCLUDE-   PHYSICAL EXAMINATION:  GENERAL:critically ill appearing, +resp distress NEUROLOGIC: obtunded, GCS<8   Patient assessed or the symptoms described in the history of present illness.  In the context of the Global COVID-19 pandemic, which necessitated consideration that the patient might be at risk for infection with the SARS-CoV-2 virus that causes COVID-19, Institutional protocols and algorithms that pertain to the evaluation of patients at risk for COVID-19 are in a state of rapid change based on information released by regulatory bodies including the CDC and federal and state organizations. These policies and algorithms were followed during the patient's care while in hospital.    MEDICATIONS: I have reviewed all medications and confirmed regimen as documented    Nutrition Status: Nutrition Problem: Inadequate oral intake Etiology: inability to eat Signs/Symptoms: NPO status Interventions: Tube feeding, MVI, Prostat     Indwelling Urinary Catheter continued, requirement due  to   Reason to continue Indwelling Urinary Catheter strict Intake/Output monitoring for hemodynamic instability   Central Line/ continued, requirement due to  Reason to continue [place] of central venous pressure or other hemodynamic parameters and poor IV access   Ventilator continued, requirement due to severe respiratory failure   Ventilator Sedation RASS 0 to -2      ASSESSMENT AND PLAN SYNOPSIS  Acute hypoxemic respiratory failure due to COVID-19 pneumonia / ARDScomplicated by renal failure s/p extubation and re-intubated for acute aspiration pneumonia  Severe ACUTE Hypoxic and Hypercapnic Respiratory Failure -continue Full MV support -continue Bronchodilator Therapy -Wean Fio2 and PEEP as tolerated -will perform SAT/SBT when respiratory parameters are met -VAP/VENT bundle implementation  Morbid obesity, possible OSA.   Will certainly impact respiratory mechanics, ventilator weaning Suspect will need to consider additional PEEP    NEUROLOGY - intubated and sedated - minimal sedation to achieve a RASS goal: -1 Wake up assessment pending   CARDIAC ICU monitoring  ID aspiration pneumonia -continue IV abx as prescibed -follow up cultures  GI GI PROPHYLAXIS as indicated   DIET-->TF's as tolerated Constipation protocol as indicated  ENDO - will use ICU hypoglycemic\Hyperglycemia protocol if indicated     ELECTROLYTES -follow labs as needed -replace as needed -pharmacy consultation and following   DVT/GI PRX ordered and assessed TRANSFUSIONS AS NEEDED MONITOR FSBS I Assessed the need for Labs I Assessed the need for Foley I Assessed the need for Central Venous Line Family Discussion when available I Assessed the need for Mobilization I made an Assessment of medications to be adjusted accordingly Safety Risk assessment completed   CASE DISCUSSED IN MULTIDISCIPLINARY ROUNDS WITH ICU TEAM  Critical Care Time devoted to patient care  services described in this note is 35 minutes.   Overall, patient is critically ill, prognosis is guarded.  Patient with Multiorgan failure and at high risk for cardiac arrest and death.     David , M.D.  [facility]  Medical Director ICU-[facility] [place] Medical Director [facility] Cardio-Pulmonary Department      

## 2020-08-14 NOTE — Progress Notes (Signed)
PCCM Brief interval note  64 yo F with acute hypoxic respiratory failure due to COVID-19.   Has been off sedation with hope of WUA.  Evaluated overnight- Pt did wake up, follow simple commands but has had significant gagging/coughing episodes with associated hypoxia (SpO2 86%), hypertension SBP 168 , tachycardia Hr 140s tachypnea RR 40s, grimacing.  Restarting low-dose fentanyl infusion, RASS goal 0 to -1. D/w pt RN   In follow up evaluation pt appears comfortable. SpO2 improved now 94%. HR 110, SBP 142.    Eliseo Gum MSN, AGACNP-BC [facility] 08/14/2020, 10:00 PM

## 2020-08-14 NOTE — Progress Notes (Signed)
WUA performed. Pt responds to pain without opening eyes. Propofol remains off and MD aware. Coughing but tolerating ventilator. Continues to be in a-fib with rate 120's, MDs aware and amiodaron gtt running. Wounds assessed and cream applied to buttocks. Son updated on POC. Will continue to monitor.

## 2020-08-14 NOTE — Progress Notes (Signed)
Patient respirations increased and labored  after bed bath, O2 sats down to 88%, Propofol dtt restarted. RT at bedside

## 2020-08-14 NOTE — Progress Notes (Signed)
[place] for Electrolyte Monitoring and Replacement   Recent Labs: Potassium (mmol/L)  Date Value  08/14/2020 3.5   Magnesium (mg/dL)  Date Value  08/14/2020 1.8   Calcium (mg/dL)  Date Value  08/14/2020 9.4   Calcium, Total (PTH) (mg/dL)  Date Value  07/24/2020 10.1   Albumin (g/dL)  Date Value  08/14/2020 2.6 (L)  10/22/2019 4.2   Phosphorus (mg/dL)  Date Value  08/14/2020 2.8   Sodium (mmol/L)  Date Value  08/14/2020 146 (H)  10/22/2019 139    Assessment: 64 year old female with PMHx of HTN, HLD who was admitted with COVID-19 PNA, also with A-fib with RVR and severe intertrigo.  Goal of Therapy:  Potassium 4.0 - 5.1 mmol/L Magnesium 2.0 - 2.4 mg/dL All Other Electrolytes WNL  Plan:   KCl 40 mEq per tube x 1   Magnesium sulfate 2 grams  IV x 1  F/U electrolytes in am  Dallie Piles ,PharmD Clinical Pharmacist 08/14/2020 7:03 AM

## 2020-08-14 NOTE — Progress Notes (Signed)
[facility] Cardiology  SUBJECTIVE: Patient remains intubated   Vitals:   08/14/20 0300 08/14/20 0400 08/14/20 0500 08/14/20 0600  BP: (!) 104/49 123/81 120/65 120/70  Pulse: (!) 53  (!) 103 69  Resp: (!) 28 (!) 30 (!) 29 (!) 30  Temp:      TempSrc:      SpO2: 100% 100% 99% 100%  Weight:      Height:         Intake/Output Summary (Last 24 hours) at 08/14/2020 0750 Last data filed at 08/14/2020 0600 Gross per 24 hour  Intake 1321.34 ml  Output 3100 ml  Net -1778.66 ml      PHYSICAL EXAM  General: Well developed, well nourished, in no acute distress HEENT:  Normocephalic and atramatic Neck:  No JVD.  Lungs: Clear bilaterally to auscultation and percussion. Heart: HRRR . Normal S1 and S2 without gallops or murmurs.  Abdomen: Bowel sounds are positive, abdomen soft and non-tender  Msk:  Back normal, normal gait. Normal strength and tone for age. Extremities: No clubbing, cyanosis or edema.   Neuro: Alert and oriented X 3. Psych:  Good affect, responds appropriately   LABS: Basic Metabolic Panel: Recent Labs    08/13/20 0405 08/14/20 0413  NA 147* 146*  K 3.2* 3.5  CL 110 106  CO2 28 30  GLUCOSE 99 113*  BUN 27* 17  CREATININE 0.93 0.92  CALCIUM 9.3 9.4  MG 1.9 1.8  PHOS 2.7 2.8   Liver Function Tests: Recent Labs    08/13/20 0405 08/14/20 0413  AST 18 23  ALT 42 39  ALKPHOS 49 55  BILITOT 0.5 0.7  PROT 5.4* 6.0*  ALBUMIN 2.3* 2.6*   No results for input(s): LIPASE, AMYLASE in the last 72 hours. CBC: Recent Labs    08/13/20 0405 08/14/20 0413  WBC 6.4 7.1  NEUTROABS 4.7 5.8  HGB 10.5* 10.8*  HCT 34.3* 35.8*  MCV 90.7 91.1  PLT 144* 158   Cardiac Enzymes: No results for input(s): CKTOTAL, CKMB, CKMBINDEX, TROPONINI in the last 72 hours. BNP: Invalid input(s): POCBNP D-Dimer: No results for input(s): DDIMER in the last 72 hours. Hemoglobin A1C: No results for input(s): HGBA1C in the last 72 hours. Fasting Lipid Panel: No results for  input(s): CHOL, HDL, LDLCALC, TRIG, CHOLHDL, LDLDIRECT in the last 72 hours. Thyroid Function Tests: No results for input(s): TSH, T4TOTAL, T3FREE, THYROIDAB in the last 72 hours.  Invalid input(s): FREET3 Anemia Panel: No results for input(s): VITAMINB12, FOLATE, FERRITIN, TIBC, IRON, RETICCTPCT in the last 72 hours.  No results found.   Echo LVEF 45 to 50%  TELEMETRY: Atrial fibrillation 110 to 130 bpm:  ASSESSMENT AND PLAN:  Principal Problem:   Sepsis ([place]) Active Problems:   Hypertension   Morbid obesity (HCC)   Hypercalcemia   Hyperthyroidism   AKI (acute kidney injury) ([place])   COVID-19 virus infection   Atrial fibrillation with RVR (HCC)   Hypokalemia    1. Persistent atrial fibrillation with rapid ventricular rate, exacerbated by probable aspiration pneumonia and recurrent respiratory failure with intubation, on amiodarone drip, modestly improved after addition of IV digoxin 2.COVID-19 pneumonia/sepsis, extubated, followed by vomiting and probable aspiration pneumoniawith recurrent respiratory failure requiring reintubation  Recommendations  1.Agree with current therapy 2.Continue amiodarone infusion 3.Continue digoxin 0.25 mg IV daily 4.Continue Eliquis 5 mg twice daily per NG tube   Isaias Cowman, MD, PhD, [facility] 08/14/2020 7:50 AM

## 2020-08-14 NOTE — Progress Notes (Signed)
Patient coughing and fighting vent, large amounts of oral secretions. HR up to 160's, PO2 sat down to 86%, RR in the 40's, does not respond to verbal calming, or suctioning. RT at bedside. Shirlee Limerick NP aware, ok to start Fentanyl DTT. Patient did follow some simple commands, tracked and made eye contact when her name was called. She stuck out her tongue, griped this RN hands, and wiggled toes to command. Patient HR down to 110-135, and RR down to 20-30's, PO2 sats up to 94% after Fentanyl started.

## 2020-08-15 DIAGNOSIS — N179 Acute kidney failure, unspecified: Secondary | ICD-10-CM | POA: Diagnosis not present

## 2020-08-15 DIAGNOSIS — U071 COVID-19: Secondary | ICD-10-CM | POA: Diagnosis not present

## 2020-08-15 DIAGNOSIS — I4891 Unspecified atrial fibrillation: Secondary | ICD-10-CM | POA: Diagnosis not present

## 2020-08-15 LAB — BLOOD GAS, ARTERIAL
Acid-Base Excess: 7 mmol/L — ABNORMAL HIGH (ref 0.0–2.0)
Bicarbonate: 32 mmol/L — ABNORMAL HIGH (ref 20.0–28.0)
FIO2: 0.3
O2 Saturation: 91.4 %
PEEP: 5 cmH2O
Patient temperature: 37
Pressure support: 5 cmH2O
pCO2 arterial: 46 mmHg (ref 32.0–48.0)
pH, Arterial: 7.45 (ref 7.350–7.450)
pO2, Arterial: 59 mmHg — ABNORMAL LOW (ref 83.0–108.0)

## 2020-08-15 LAB — COMPREHENSIVE METABOLIC PANEL
ALT: 35 U/L (ref 0–44)
AST: 27 U/L (ref 15–41)
Albumin: 2.7 g/dL — ABNORMAL LOW (ref 3.5–5.0)
Alkaline Phosphatase: 60 U/L (ref 38–126)
Anion gap: 12 (ref 5–15)
BUN: 12 mg/dL (ref 8–23)
CO2: 29 mmol/L (ref 22–32)
Calcium: 9.5 mg/dL (ref 8.9–10.3)
Chloride: 100 mmol/L (ref 98–111)
Creatinine, Ser: 0.97 mg/dL (ref 0.44–1.00)
GFR, Estimated: >60 mL/min (ref >60)
Glucose, Bld: 100 mg/dL — ABNORMAL HIGH (ref 70–99)
Potassium: 2.9 mmol/L — ABNORMAL LOW (ref 3.5–5.1)
Sodium: 141 mmol/L (ref 135–145)
Total Bilirubin: 1.1 mg/dL (ref 0.3–1.2)
Total Protein: 6.3 g/dL — ABNORMAL LOW (ref 6.5–8.1)

## 2020-08-15 LAB — CBC WITH DIFFERENTIAL/PLATELET
Abs Immature Granulocytes: 0.03 10*3/uL (ref 0.00–0.07)
Basophils Absolute: 0 10*3/uL (ref 0.0–0.1)
Basophils Relative: 0 %
Eosinophils Absolute: 0.4 10*3/uL (ref 0.0–0.5)
Eosinophils Relative: 5 %
HCT: 34.9 % — ABNORMAL LOW (ref 36.0–46.0)
Hemoglobin: 10.9 g/dL — ABNORMAL LOW (ref 12.0–15.0)
Immature Granulocytes: 0 %
Lymphocytes Relative: 14 %
Lymphs Abs: 1 10*3/uL (ref 0.7–4.0)
MCH: 27.9 pg (ref 26.0–34.0)
MCHC: 31.2 g/dL (ref 30.0–36.0)
MCV: 89.5 fL (ref 80.0–100.0)
Monocytes Absolute: 0.6 10*3/uL (ref 0.1–1.0)
Monocytes Relative: 9 %
Neutro Abs: 5.1 10*3/uL (ref 1.7–7.7)
Neutrophils Relative %: 72 %
Platelets: 155 10*3/uL (ref 150–400)
RBC: 3.9 MIL/uL (ref 3.87–5.11)
RDW: 16.7 % — ABNORMAL HIGH (ref 11.5–15.5)
WBC: 7.2 10*3/uL (ref 4.0–10.5)
nRBC: 0 % (ref 0.0–0.2)

## 2020-08-15 LAB — GLUCOSE, CAPILLARY
Glucose-Capillary: 101 mg/dL — ABNORMAL HIGH (ref 70–99)
Glucose-Capillary: 86 mg/dL (ref 70–99)
Glucose-Capillary: 88 mg/dL (ref 70–99)
Glucose-Capillary: 89 mg/dL (ref 70–99)
Glucose-Capillary: 97 mg/dL (ref 70–99)
Glucose-Capillary: 99 mg/dL (ref 70–99)

## 2020-08-15 LAB — MAGNESIUM: Magnesium: 1.9 mg/dL (ref 1.7–2.4)

## 2020-08-15 LAB — PHOSPHORUS: Phosphorus: 2.5 mg/dL (ref 2.5–4.6)

## 2020-08-15 MED ORDER — POTASSIUM CHLORIDE 10 MEQ/50ML IV SOLN
10.0000 meq | INTRAVENOUS | Status: AC
  Administered 2020-08-15 (×5): 10 meq via INTRAVENOUS
  Filled 2020-08-15 (×5): qty 50

## 2020-08-15 MED ORDER — POTASSIUM PHOSPHATES 15 MMOLE/5ML IV SOLN: 30.0000 mmol | Freq: Once | INTRAVENOUS | Status: DC

## 2020-08-15 MED ORDER — MAGNESIUM SULFATE 2 GM/50ML IV SOLN
2.0000 g | Freq: Once | INTRAVENOUS | Status: AC
  Administered 2020-08-15: 2 g via INTRAVENOUS
  Filled 2020-08-15: qty 50

## 2020-08-15 MED ORDER — HEPARIN (PORCINE) 25000 UT/250ML-% IV SOLN
1350.0000 [IU]/h | INTRAVENOUS | Status: DC
  Administered 2020-08-15: 1100 [IU]/h via INTRAVENOUS
  Administered 2020-08-16 – 2020-08-17 (×2): 1350 [IU]/h via INTRAVENOUS
  Filled 2020-08-15 (×3): qty 250

## 2020-08-15 NOTE — Progress Notes (Signed)
[facility] Cardiology  SUBJECTIVE: Patient still intubated, no significant clinical change   Vitals:   08/15/20 0411 08/15/20 0500 08/15/20 0515 08/15/20 0600  BP:  (!) 113/101 134/84 127/77  Pulse:  (!) 101  (!) 59  Resp:  (!) 24  (!) 26  Temp:      TempSrc:      SpO2: 93% 97%  94%  Weight:      Height:         Intake/Output Summary (Last 24 hours) at 08/15/2020 3151 Last data filed at 08/15/2020 0600 Gross per 24 hour  Intake 1231.61 ml  Output 2955 ml  Net -1723.39 ml      PHYSICAL EXAM  General: Well developed, well nourished, in no acute distress HEENT:  Normocephalic and atramatic Neck:  No JVD.  Lungs: Clear bilaterally to auscultation and percussion. Heart: HRRR . Normal S1 and S2 without gallops or murmurs.  Abdomen: Bowel sounds are positive, abdomen soft and non-tender  Msk:  Back normal, normal gait. Normal strength and tone for age. Extremities: No clubbing, cyanosis or edema.   Neuro: Alert and oriented X 3. Psych:  Good affect, responds appropriately   LABS: Basic Metabolic Panel: Recent Labs    08/14/20 0413 08/15/20 0515  NA 146* 141  K 3.5 2.9*  CL 106 100  CO2 30 29  GLUCOSE 113* 100*  BUN 17 12  CREATININE 0.92 0.97  CALCIUM 9.4 9.5  MG 1.8 1.9  PHOS 2.8 2.5   Liver Function Tests: Recent Labs    08/14/20 0413 08/15/20 0515  AST 23 27  ALT 39 35  ALKPHOS 55 60  BILITOT 0.7 1.1  PROT 6.0* 6.3*  ALBUMIN 2.6* 2.7*   No results for input(s): LIPASE, AMYLASE in the last 72 hours. CBC: Recent Labs    08/14/20 0413 08/15/20 0515  WBC 7.1 7.2  NEUTROABS 5.8 5.1  HGB 10.8* 10.9*  HCT 35.8* 34.9*  MCV 91.1 89.5  PLT 158 155   Cardiac Enzymes: No results for input(s): CKTOTAL, CKMB, CKMBINDEX, TROPONINI in the last 72 hours. BNP: Invalid input(s): POCBNP D-Dimer: No results for input(s): DDIMER in the last 72 hours. Hemoglobin A1C: No results for input(s): HGBA1C in the last 72 hours. Fasting Lipid Panel: Recent Labs     08/14/20 1445  TRIG 86   Thyroid Function Tests: No results for input(s): TSH, T4TOTAL, T3FREE, THYROIDAB in the last 72 hours.  Invalid input(s): FREET3 Anemia Panel: No results for input(s): VITAMINB12, FOLATE, FERRITIN, TIBC, IRON, RETICCTPCT in the last 72 hours.  No results found.   Echo LVEF 45 to 50%  TELEMETRY: Atrial fibrillation 110 bpm:  ASSESSMENT AND PLAN:  Principal Problem:   Sepsis ([place]) Active Problems:   Hypertension   Morbid obesity (HCC)   Hypercalcemia   Hyperthyroidism   AKI (acute kidney injury) ([place])   COVID-19 virus infection   Atrial fibrillation with RVR (HCC)   Hypokalemia    1. Persistent atrial fibrillation with rapid ventricular rate, exacerbated by probable aspiration pneumonia and recurrent respiratory failure with intubation, on amiodarone drip, modestly improved after addition of IV digoxin 2.COVID-19 pneumonia/sepsis, extubated, followed by vomiting and probable aspiration pneumoniawith recurrent respiratory failure requiring reintubation  Recommendations  1.Agree with current therapy 2.Continue amiodarone infusion, transition to p.o. when extubated 3.Continue digoxin 0.25 mg IV daily, transition to p.o. when extubated 4.Continue Eliquis 5 mg twice daily per NG tube  Courtney Cowman, MD, PhD, [facility] 08/15/2020 7:52 AM

## 2020-08-15 NOTE — Progress Notes (Signed)
Assisted tele visit to patient with Family member.  Margaret Pyle, RN

## 2020-08-15 NOTE — Progress Notes (Signed)
CRITICAL CARE NOTE Severe respiratory distress and patient with COVID-19 pneumonia.  Courtney Anderson an 64 y.o.female.  Courtney Anderson is a 64 year old female with history noted below, who was admitted to [facility] on 2020-07-24 brought in via EMS as a "code sepsis". Patient subsequently tested positive for COVID-19. She presented with oxygen saturations of 69% on room air and had to be placed on nonrebreather mask to achieve oxygen saturations in the 90s. She was subsequently admitted to the Covid unit for management of COVID-19 pneumonia. She was noted to have acute kidney injury she was treated with remdesivir, zinc, vitamin C and antitussives. She was placed empirically on Rocephin and azithromycin. She continued to have increased FiO2 requirements. She was refusing to self prone. She had atrial fibrillation and was seen by cardiologyto assist in management.   07/31/20- patient is on 90% FiO2, will modify meds as patient is on metoprolol and levophed as well as cardizem. Will d/c propofol to improve SBP and remove arythmogenic vasopressor if able. Plan to have CVP trending and diuresis today. Goals of care discussion today - patient made DNR per sister and son of patient. I had 2 separate phone meetings with son and separate conversation with sister today to explain patient is worse with shock and renal failure requiring multiple pressors.   08/01/20- patient remains critically ill. FiO2 has been weaned down to 80% today.  08/03/20- patient remains critically ill shes being proned, weaning FiO2 as able.  08/04/20-patient with no interval changes continue with weaning and proning protocol. Continue weaning FiO2 and PEEP as to optimize for SBT 08/06/20- patient is weaned down 40%FiO2 today, will attempt awakening trial with SBT, I spoke to son [name] today, he appreciates update and does not want Elink video view at this time.  08/07/20- patient is requiring 50%FiO2 today 08/08/20-  Continuing to wean vent. FiO2 is 28%, will trial SBT today 08/09/20-tachypneic and hypertensive with SBT. Asynchronous.FiO2 back up to 50% 10/7 severeresp failure, attempt SAT/SBT 10/7 extubated to biPAP 10/8 Tachycardic , more confused,patient re-intubated ETT 8.0 10/9 remains on vent 10/11 severe resp failure on vent 10/12 failed weaning trial     CC  follow up respiratory failure  SUBJECTIVE Patient remains critically ill Prognosis is guarded Remains intubated   BP 127/77   Pulse (!) 59   Temp 98.5 F (36.9 C) (Axillary)   Resp (!) 26   Ht '5\' 5"'  (1.651 m)   Wt 109.9 kg   SpO2 94%   BMI 40.32 kg/m    I/O last 3 completed shifts: In: 1760.6 [P.O.:175; I.V.:1279.7; IV Piggyback:305.9] Out: 5230 [Urine:5170; Emesis/NG output:60] No intake/output data recorded.  SpO2: 94 % O2 Flow Rate (L/min): 4 L/min FiO2 (%): 30 %  Estimated body mass index is 40.32 kg/m as calculated from the following:   Height as of this encounter: '5\' 5"'  (1.651 m).   Weight as of this encounter: 109.9 kg.  SIGNIFICANT EVENTS   REVIEW OF SYSTEMS  PATIENT IS UNABLE TO PROVIDE COMPLETE REVIEW OF SYSTEMS DUE TO SEVERE CRITICAL ILLNESS   Pressure Injury 08/13/20 Buttocks Right;Left B/L buttocks and scacrum, pink,red, and black (Active)  08/13/20 2000  Location: Buttocks  Location Orientation: Right;Left  Staging:   Wound Description (Comments): B/L buttocks and scacrum, pink,red, and black  Present on Admission: No    COVID-19 DISASTER DECLARATION:   FULL CONTACT PHYSICAL EXAMINATION WAS NOT POSSIBLE DUE TO TREATMENT OF COVID-19  AND CONSERVATION OF PERSONAL PROTECTIVE EQUIPMENT, LIMITED EXAM FINDINGS INCLUDE-  PHYSICAL EXAMINATION:  GENERAL:critically ill appearing, +resp distress NEUROLOGIC: obtunded, GCS<8   Patient assessed or the symptoms described in the history of present illness.  In the context of the Global COVID-19 pandemic, which necessitated consideration  that the patient might be at risk for infection with the SARS-CoV-2 virus that causes COVID-19, Institutional protocols and algorithms that pertain to the evaluation of patients at risk for COVID-19 are in a state of rapid change based on information released by regulatory bodies including the CDC and federal and state organizations. These policies and algorithms were followed during the patient's care while in hospital.    MEDICATIONS: I have reviewed all medications and confirmed regimen as documented    Nutrition Status: Nutrition Problem: Inadequate oral intake Etiology: inability to eat Signs/Symptoms: NPO status Interventions: Tube feeding, MVI, Prostat  Vent Mode: PRVC FiO2 (%):  [30 %-40 %] 30 % Set Rate:  [16 bmp] 16 bmp Vt Set:  [430 mL] 430 mL PEEP:  [5 cmH20] 5 cmH20  CBC    Component Value Date/Time   WBC 7.2 08/15/2020 0515   RBC 3.90 08/15/2020 0515   HGB 10.9 (L) 08/15/2020 0515   HGB 12.4 05/13/2018 1134   HCT 34.9 (L) 08/15/2020 0515   HCT 37.7 05/13/2018 1134   PLT 155 08/15/2020 0515   PLT 219 05/13/2018 1134   MCV 89.5 08/15/2020 0515   MCV 73 (L) 05/13/2018 1134   MCH 27.9 08/15/2020 0515   MCHC 31.2 08/15/2020 0515   RDW 16.7 (H) 08/15/2020 0515   RDW 13.3 05/13/2018 1134   LYMPHSABS 1.0 08/15/2020 0515   LYMPHSABS 2.4 05/13/2018 1134   MONOABS 0.6 08/15/2020 0515   EOSABS 0.4 08/15/2020 0515   EOSABS 0.2 05/13/2018 1134   BASOSABS 0.0 08/15/2020 0515   BASOSABS 0.0 05/13/2018 1134   BMP Latest Ref Rng & Units 08/15/2020 08/14/2020 08/13/2020  Glucose 70 - 99 mg/dL 100(H) 113(H) 99  BUN 8 - 23 mg/dL 12 17 27(H)  Creatinine 0.44 - 1.00 mg/dL 0.97 0.92 0.93  BUN/Creat Ratio 12 - 28 - - -  Sodium 135 - 145 mmol/L 141 146(H) 147(H)  Potassium 3.5 - 5.1 mmol/L 2.9(L) 3.5 3.2(L)  Chloride 98 - 111 mmol/L 100 106 110  CO2 22 - 32 mmol/L '29 30 28  ' Calcium 8.9 - 10.3 mg/dL 9.5 9.4 9.3      Indwelling Urinary Catheter continued, requirement due  to   Reason to continue Indwelling Urinary Catheter strict Intake/Output monitoring for hemodynamic instability         Ventilator continued, requirement due to severe respiratory failure   Ventilator Sedation RASS 0 to -2      ASSESSMENT AND PLAN SYNOPSIS  Acute hypoxemic respiratory failure due to COVID-19 pneumonia / ARDScomplicated by renal failure s/p extubation and re-intubated for acute aspiration pneumonia  Severe ACUTE Hypoxic and Hypercapnic Respiratory Failure -continue Full MV support -continue Bronchodilator Therapy -Wean Fio2 and PEEP as tolerated -will perform SAT/SBT when respiratory parameters are met -VAP/VENT bundle implementation   Morbid obesity, possible OSA.   Will certainly impact respiratory mechanics, ventilator weaning Suspect will need to consider additional PEEP    NEUROLOGY - intubated and sedated - minimal sedation to achieve a RASS goal: -1 Wake up assessment pending   CARDIAC ICU monitoring  ID -continue IV abx as prescibed -follow up cultures  GI GI PROPHYLAXIS as indicated   DIET-->TF's as tolerated Constipation protocol as indicated  ENDO - will use ICU hypoglycemic\Hyperglycemia protocol if indicated  ELECTROLYTES -follow labs as needed -replace as needed -pharmacy consultation and following   DVT/GI PRX ordered and assessed TRANSFUSIONS AS NEEDED MONITOR FSBS I Assessed the need for Labs I Assessed the need for Foley I Assessed the need for Central Venous Line Family Discussion when available I Assessed the need for Mobilization I made an Assessment of medications to be adjusted accordingly Safety Risk assessment completed   CASE DISCUSSED IN MULTIDISCIPLINARY ROUNDS WITH ICU TEAM  Critical Care Time devoted to patient care services described in this note is 35 minutes.   Overall, patient is critically ill, prognosis is guarded. Palliative care team consulted  Ester Hilley Patricia Pesa, M.D.  [facility]ler  Pulmonary & Critical Care Medicine  Medical Director [place] Director [facility] Cardio-Pulmonary Department

## 2020-08-15 NOTE — Progress Notes (Addendum)
Patient extubated while family at bedside, assessments done by MD and RN before extubation, pt was able to verbalize and communicate appropriately and follows commands, Sedation turned off, RT extubated pt, no ss of distress noted. VS wnl as charted. O2 sats 92 on 6l [place], HR 116, pt has regular and unlabored breathing. Has strong cough and able to suction secretions. Lungs sounds rhonchis on auscultation.

## 2020-08-15 NOTE — Progress Notes (Signed)
Nutrition Follow-up  DOCUMENTATION CODES:   Obesity unspecified  INTERVENTION:   If tube feeds initiated, recommend:  Vital 1.5 _0 /hr- Initiate at 33m/hr and increase by 132mhr q 8 hours until goal rate is reached.   Free water flushes 3021m4 hours to maintain tube patency   Regimen provides 2520kcal/day, 114g/day protein and 1464m87my free water   Pt at high refeed risk; recommend monitor potassium, magnesium and phosphorus labs daily after tube feed initiation  NUTRITION DIAGNOSIS:   Inadequate oral intake related to inability to eat as evidenced by NPO status. Ongoing.  GOAL:   Provide needs based on ASPEN/SCCM guidelines -not met   MONITOR:   Vent status, Labs, Weight trends, Skin, I & O's  ASSESSMENT:   64 y56r old female with PMHx of HTN, HLD who was admitted with COVID-19 PNA, also with A-fib with RVR and severe intertrigo.   Pt extubated 10/7 and required re-intubation on 10/8 secondary to aspiration. Pt failed wake up assessment 10/11; plan is to re-attempt today. OGT in place. Plan is to initiate tube feeds if patient is unable to extubate. Per chart, pt has been fairly weight stable over the past 2 weeks.    Medications reviewed and include: vitamin C, aspirin, vitamin D, insulin, MVI, zinc, pepcid, diflucan, KCl  Labs reviewed: K 2.9(L), P 2.5 wnl, Mg 1.9 wnl Hgb 10.9(L), Hct 34.9(L) cbgs- 101, 99 x 24 hrs  Patient is currently intubated on ventilator support MV: 11.6 L/min Temp (24hrs), Avg:98.8 F (37.1 C), Min:97.9 F (36.6 C), Max:99.6 F (37.6 C)  Propofol: none   MAP- >65mm43mUOP- 2895ml 38mt Order:   Diet Order    None     EDUCATION NEEDS:   No education needs have been identified at this time  Skin:  Skin Assessment: Skin Integrity Issues: Skin Integrity Issues:: Other (Comment) Other: intertriginous dermatitis to B/L breasts, B/L groin, under pannus, umbilicus; non pressure wound to right lower abdomen  Last BM:   10/8- type 7  Height:   Ht Readings from Last 1 Encounters:  08/05/20 _1  (1.651 m)   Weight:   Wt Readings from Last 1 Encounters:  08/12/20 109.9 kg   Ideal Body Weight:  56.8 kg  BMI:  Body mass index is 40.32 kg/m.  Estimated Nutritional Needs:   Kcal:  2340-2536kcal/day  Protein:  >115g/day  Fluid:  >/= 2 L/day  Chantavia Bazzle Koleen DistanceD, LDN Please refer to [facility] and/or RD on-call/weekend/after hours pager

## 2020-08-15 NOTE — Progress Notes (Signed)
Darlyn Chamber NP aware of holding Eliquis PO due to NPO status.

## 2020-08-15 NOTE — Progress Notes (Signed)
ANTICOAGULATION CONSULT NOTE - Initial Consult  Pharmacy Consult for Heparin (switch from Eliquis) Indication: atrial fibrillation  Allergies  Allergen Reactions  . Codeine     Patient Measurements: Height: 5\' 5"  (165.1 cm) Weight: 109.9 kg (242 lb 4.6 oz) IBW/kg (Calculated) : 57 HEPARIN DW (KG): 73.7  Vital Signs: Temp: 97.8 F (36.6 C) (10/12 1600) Temp Source: Axillary (10/12 1600) BP: 138/58 (10/12 2100) Pulse Rate: 108 (10/12 2100)  Labs: Recent Labs    08/13/20 0405 08/13/20 0405 08/14/20 0413 08/15/20 0515  HGB 10.5*   < > 10.8* 10.9*  HCT 34.3*  --  35.8* 34.9*  PLT 144*  --  158 155  CREATININE 0.93  --  0.92 0.97   < > = values in this interval not displayed.    Estimated Creatinine Clearance: 71.4 mL/min (by C-G formula based on SCr of 0.97 mg/dL).   Medical History: Past Medical History:  Diagnosis Date  . Allergy   . Elevated transaminase level   . Hyperlipidemia   . Hypertension   . Obesity     Medications:  Scheduled:  . vitamin C  500 mg Per Tube Daily  . aspirin  81 mg Per Tube Daily  . Chlorhexidine Gluconate Cloth  6 each Topical Daily  . cholecalciferol  2,000 Units Per Tube Daily  . digoxin  0.25 mg Intravenous Daily  . insulin aspart  0-9 Units Subcutaneous Q4H  . multivitamin with minerals  1 tablet Per Tube Daily  . nystatin   Topical TID  . pneumococcal 23 valent vaccine  0.5 mL Intramuscular Tomorrow-1000  . zinc sulfate  220 mg Per Tube Daily   Assessment: Switching from Eliquis to IV Heparin as pt is now NPO.  Will use aPTT to adjust Heparin infusion. Last Eliquis 5mg  dose was this am at 0900  Goal of Therapy:  APTT 66-102 Monitor platelets by anticoagulation protocol: Yes   Plan:  Begin Heparin infusion at 1100 units/hr (no bolus given recent Eliquis dose) Check aPTT and CBC in am  Hart Robinsons A 08/15/2020,10:38 PM

## 2020-08-15 NOTE — Plan of Care (Signed)
Patient extubated to large bore nasal canula with 10L flow

## 2020-08-15 NOTE — Progress Notes (Signed)
[place] for Electrolyte Monitoring and Replacement   Recent Labs: Potassium (mmol/L)  Date Value  08/15/2020 2.9 (L)   Magnesium (mg/dL)  Date Value  08/15/2020 1.9   Calcium (mg/dL)  Date Value  08/15/2020 9.5   Calcium, Total (PTH) (mg/dL)  Date Value  07/24/2020 10.1   Albumin (g/dL)  Date Value  08/15/2020 2.7 (L)  10/22/2019 4.2   Phosphorus (mg/dL)  Date Value  08/15/2020 2.5   Sodium (mmol/L)  Date Value  08/15/2020 141  10/22/2019 139    Assessment: 64 year old female with PMHx of HTN, HLD who was admitted with COVID-19 PNA, also with A-fib with RVR and severe intertrigo.  Goal of Therapy:  Potassium 4.0 - 5.1 mmol/L Magnesium 2.0 - 2.4 mg/dL All Other Electrolytes WNL  Plan:   KCl 10 mEq IV x 5  Magnesium sulfate 2 grams  IV x 1  F/U electrolytes in am  Tawnya Crook ,PharmD Clinical Pharmacist 08/15/2020 10:57 AM

## 2020-08-15 NOTE — Progress Notes (Signed)
Son at bedside Updated on patients clinical condition Patient with somewhat successfull SAT/SBT Plan for extubation

## 2020-08-16 DIAGNOSIS — I4891 Unspecified atrial fibrillation: Secondary | ICD-10-CM | POA: Diagnosis not present

## 2020-08-16 DIAGNOSIS — U071 COVID-19: Secondary | ICD-10-CM | POA: Diagnosis not present

## 2020-08-16 LAB — COMPREHENSIVE METABOLIC PANEL
ALT: 29 U/L (ref 0–44)
AST: 26 U/L (ref 15–41)
Albumin: 2.5 g/dL — ABNORMAL LOW (ref 3.5–5.0)
Alkaline Phosphatase: 58 U/L (ref 38–126)
Anion gap: 11 (ref 5–15)
BUN: 10 mg/dL (ref 8–23)
CO2: 31 mmol/L (ref 22–32)
Calcium: 9.3 mg/dL (ref 8.9–10.3)
Chloride: 97 mmol/L — ABNORMAL LOW (ref 98–111)
Creatinine, Ser: 0.88 mg/dL (ref 0.44–1.00)
GFR, Estimated: >60 mL/min (ref >60)
Glucose, Bld: 88 mg/dL (ref 70–99)
Potassium: 2.8 mmol/L — ABNORMAL LOW (ref 3.5–5.1)
Sodium: 139 mmol/L (ref 135–145)
Total Bilirubin: 1.1 mg/dL (ref 0.3–1.2)
Total Protein: 6 g/dL — ABNORMAL LOW (ref 6.5–8.1)

## 2020-08-16 LAB — GLUCOSE, CAPILLARY
Glucose-Capillary: 82 mg/dL (ref 70–99)
Glucose-Capillary: 97 mg/dL (ref 70–99)
Glucose-Capillary: 85 mg/dL (ref 70–99)
Glucose-Capillary: 94 mg/dL (ref 70–99)
Glucose-Capillary: 83 mg/dL (ref 70–99)
Glucose-Capillary: 93 mg/dL (ref 70–99)

## 2020-08-16 LAB — CBC WITH DIFFERENTIAL/PLATELET
Abs Immature Granulocytes: 0.04 10*3/uL (ref 0.00–0.07)
Basophils Absolute: 0 10*3/uL (ref 0.0–0.1)
Basophils Relative: 0 %
Eosinophils Absolute: 0.4 10*3/uL (ref 0.0–0.5)
Eosinophils Relative: 6 %
HCT: 35.9 % — ABNORMAL LOW (ref 36.0–46.0)
Hemoglobin: 11.4 g/dL — ABNORMAL LOW (ref 12.0–15.0)
Immature Granulocytes: 1 %
Lymphocytes Relative: 16 %
Lymphs Abs: 1.1 10*3/uL (ref 0.7–4.0)
MCH: 27.7 pg (ref 26.0–34.0)
MCHC: 31.8 g/dL (ref 30.0–36.0)
MCV: 87.3 fL (ref 80.0–100.0)
Monocytes Absolute: 0.5 10*3/uL (ref 0.1–1.0)
Monocytes Relative: 7 %
Neutro Abs: 4.6 10*3/uL (ref 1.7–7.7)
Neutrophils Relative %: 70 %
Platelets: 167 10*3/uL (ref 150–400)
RBC: 4.11 MIL/uL (ref 3.87–5.11)
RDW: 16.2 % — ABNORMAL HIGH (ref 11.5–15.5)
WBC: 6.6 10*3/uL (ref 4.0–10.5)
nRBC: 0.3 % — ABNORMAL HIGH (ref 0.0–0.2)

## 2020-08-16 LAB — APTT
aPTT: 48 seconds — ABNORMAL HIGH (ref 24–36)
aPTT: 59 seconds — ABNORMAL HIGH (ref 24–36)
aPTT: 87 seconds — ABNORMAL HIGH (ref 24–36)

## 2020-08-16 LAB — POTASSIUM: Potassium: 2.8 mmol/L — ABNORMAL LOW (ref 3.5–5.1)

## 2020-08-16 LAB — MAGNESIUM: Magnesium: 1.8 mg/dL (ref 1.7–2.4)

## 2020-08-16 LAB — PHOSPHORUS: Phosphorus: 2.1 mg/dL — ABNORMAL LOW (ref 2.5–4.6)

## 2020-08-16 MED ORDER — MAGNESIUM SULFATE 2 GM/50ML IV SOLN
2.0000 g | Freq: Once | INTRAVENOUS | Status: AC
  Administered 2020-08-16: 2 g via INTRAVENOUS
  Filled 2020-08-16: qty 50

## 2020-08-16 MED ORDER — POTASSIUM CHLORIDE 10 MEQ/50ML IV SOLN
10.0000 meq | INTRAVENOUS | Status: AC
  Administered 2020-08-16 – 2020-08-17 (×6): 10 meq via INTRAVENOUS
  Filled 2020-08-16 (×6): qty 50

## 2020-08-16 MED ORDER — POTASSIUM CHLORIDE 10 MEQ/50ML IV SOLN
10.0000 meq | INTRAVENOUS | Status: AC
  Administered 2020-08-16 (×6): 10 meq via INTRAVENOUS
  Filled 2020-08-16 (×6): qty 50

## 2020-08-16 MED ORDER — HEPARIN BOLUS VIA INFUSION
1000.0000 [IU] | Freq: Once | INTRAVENOUS | Status: AC
  Administered 2020-08-16: 1000 [IU] via INTRAVENOUS
  Filled 2020-08-16: qty 1000

## 2020-08-16 MED ORDER — SODIUM CHLORIDE 0.9 % IV BOLUS: 500.0000 mL | Freq: Once | INTRAVENOUS | Status: DC

## 2020-08-16 NOTE — Progress Notes (Signed)
Nutrition Follow-up  DOCUMENTATION CODES:   Obesity unspecified  INTERVENTION:   RD will add supplements once diet advanced and pending SLP recommendations  NUTRITION DIAGNOSIS:   Inadequate oral intake related to inability to eat as evidenced by NPO status. Ongoing.  GOAL:   Patient will meet greater than or equal to 90% of their needs -previously met with TF regimen.  MONITOR:   Diet advancement, Labs, Weight trends, Skin, I & O's  ASSESSMENT:   64 year old female with PMHx of HTN, HLD who was admitted with COVID-19 PNA, also with A-fib with RVR and severe intertrigo.   Pt extubated 10/12. SLP evaluation pending; pt currently refusing assessment. RD will add supplements once diet is advanced and pending SLP recommendations. Per chart, pt appears fairly weight stable since 9/27 but has not been weighed since 10/9.   Medications reviewed and include: vitamin C, aspirin, vitamin D, insulin, MVI, zinc, pepcid, KCl, heparin, Mg sulfate, KPhos   Labs reviewed: K 2.8(L), P 2.1(L), Mg 1.8 wnl cbgs- 82, 97 x 24 hrs  Diet Order:   Diet Order    None     EDUCATION NEEDS:   No education needs have been identified at this time  Skin:  Skin Assessment: Skin Integrity Issues: Skin Integrity Issues:: Other (Comment) Other: intertriginous dermatitis to B/L breasts, B/L groin, under pannus, umbilicus; non pressure wound to right lower abdomen  Last BM:  10/8- type 7  Height:   Ht Readings from Last 1 Encounters:  08/05/20 _0  (1.651 m)   Weight:   Wt Readings from Last 1 Encounters:  08/12/20 109.9 kg   Ideal Body Weight:  56.8 kg  BMI:  Body mass index is 40.32 kg/m.  Estimated Nutritional Needs:   Kcal:  2200-2500kcal/day  Protein:  110-125g/day  Fluid:  >/= 2 L/day  Koleen Distance MS, RD, LDN Please refer to [facility] for RD and/or RD on-call/weekend/after hours pager

## 2020-08-16 NOTE — Progress Notes (Signed)
   08/16/20 1300  Clinical Encounter Type  Visited With Patient  Visit Type Initial  Referral From Chaplain  Consult/Referral To Chaplain  While rounding ICU, occupation therapist told chaplin Pt needs to be motivated. Chaplain went into the room and tried talking to Pt. Chaplain did not understand what she was saying, but chaplain will try to talk with her tomorrow.

## 2020-08-16 NOTE — Progress Notes (Signed)
[place] for Heparin (switch from Eliquis) Indication: atrial fibrillation  Allergies  Allergen Reactions  . Codeine     Patient Measurements: Height: 5\' 5"  (165.1 cm) Weight: 109.9 kg (242 lb 4.6 oz) IBW/kg (Calculated) : 57 HEPARIN DW (KG): 73.7  Vital Signs: Temp: 98.6 F (37 C) (10/13 0800) Temp Source: Oral (10/13 0800) BP: 140/95 (10/13 0500) Pulse Rate: 95 (10/13 0500)  Labs: Recent Labs    08/14/20 0413 08/14/20 0413 08/15/20 0515 08/16/20 0456 08/16/20 0616  HGB 10.8*   < > 10.9*  --  11.4*  HCT 35.8*  --  34.9*  --  35.9*  PLT 158  --  155  --  167  APTT  --   --   --   --  48*  CREATININE 0.92  --  0.97 0.88  --    < > = values in this interval not displayed.    Estimated Creatinine Clearance: 78.7 mL/min (by C-G formula based on SCr of 0.88 mg/dL).   Medical History: Past Medical History:  Diagnosis Date  . Allergy   . Elevated transaminase level   . Hyperlipidemia   . Hypertension   . Obesity    Assessment: Switching from Eliquis to IV Heparin as pt is now NPO.  Will use aPTT to adjust Heparin infusion. Last Eliquis 5mg  dose was 10/12 at 0900  Goal of Therapy:  APTT 66-102 Monitor platelets by anticoagulation protocol: Yes   Plan:  10/13 0616 APTT 48 subtherapeutic. Heparin 1000 unit bolus followed by increase in heparin drip to 1250 units/hr. Recheck APTT at 1400. CBC daily.  Tawnya Crook, PharmD 08/16/2020,9:39 AM

## 2020-08-16 NOTE — Progress Notes (Signed)
[place] for Electrolyte Monitoring and Replacement   Recent Labs: Potassium (mmol/L)  Date Value  08/16/2020 2.8 (L)   Magnesium (mg/dL)  Date Value  08/16/2020 1.8   Calcium (mg/dL)  Date Value  08/16/2020 9.3   Calcium, Total (PTH) (mg/dL)  Date Value  07/24/2020 10.1   Albumin (g/dL)  Date Value  08/16/2020 2.5 (L)  10/22/2019 4.2   Phosphorus (mg/dL)  Date Value  08/16/2020 2.1 (L)   Sodium (mmol/L)  Date Value  08/16/2020 139  10/22/2019 139   Assessment: 64 year old female with PMHx of HTN, HLD who was admitted with COVID-19 PNA, also with A-fib with RVR and severe intertrigo.  Patient extubated 10/12. Remains NPO pending speech eval which patient has refused.  Goal of Therapy:  Potassium 4.0 - 5.1 mmol/L Magnesium 2.0 - 2.4 mg/dL All Other Electrolytes WNL  Plan:   KCl 10 mEq IV x 6  Will order repeat potassium tomorrow am as patient has remained consistently hypokalemic despite replacement  F/U electrolytes in am  Ena Dawley ,PharmD Clinical Pharmacist 08/16/2020 11:13 PM

## 2020-08-16 NOTE — Progress Notes (Signed)
SLP Cancellation Note  Patient Details Name: Courtney Anderson MRN: [id number] DOB: Oct 14, 1956   Cancelled treatment:       Reason Eval/Treat Not Completed: Patient declined, no reason specified (chart reviewed; consulted NSG and MD). Despite several attempts, pt continued to refuse po trials and BSE assessment w/ SLP. Pt became agitated saying she "wasn't going to do it".  NSG and MD made aware. ST services will f/u tomorrow. Pt to remain NPO w/ oral care for hygiene and stimulation of swallowing.      [name], MS, CCC-SLP Speech Language Pathologist Rehab Services [phone] [facility] 08/16/2020, 9:54 AM

## 2020-08-16 NOTE — Progress Notes (Signed)
CRITICAL CARE NOTE  Severe respiratory distress and patient with COVID-19 pneumonia.  Courtney Anderson an 64 y.o.female.  [id number] is a 64 year old female with history noted below, who was admitted to [facility] on 2020-07-24 brought in via EMS as a "code sepsis". Patient subsequently tested positive for COVID-19. She presented with oxygen saturations of 69% on room air and had to be placed on nonrebreather mask to achieve oxygen saturations in the 90s. She was subsequently admitted to the Covid unit for management of COVID-19 pneumonia. She was noted to have acute kidney injury she was treated with remdesivir, zinc, vitamin C and antitussives. She was placed empirically on Rocephin and azithromycin. She continued to have increased FiO2 requirements. She was refusing to self prone. She had atrial fibrillation and was seen by cardiologyto assist in management.   07/31/20- patient is on 90% FiO2, will modify meds as patient is on metoprolol and levophed as well as cardizem. Will d/c propofol to improve SBP and remove arythmogenic vasopressor if able. Plan to have CVP trending and diuresis today. Goals of care discussion today - patient made DNR per sister and son of patient. I had 2 separate phone meetings with son and separate conversation with sister today to explain patient is worse with shock and renal failure requiring multiple pressors.   08/01/20- patient remains critically ill. FiO2 has been weaned down to 80% today.  08/03/20- patient remains critically ill shes being proned, weaning FiO2 as able.  08/04/20-patient with no interval changes continue with weaning and proning protocol. Continue weaning FiO2 and PEEP as to optimize for SBT 08/06/20- patient is weaned down 40%FiO2 today, will attempt awakening trial with SBT, I spoke to son [name] today, he appreciates update and does not want Elink video view at this time.  08/07/20- patient is requiring 50%FiO2 today 08/08/20-  Continuing to wean vent. FiO2 is 28%, will trial SBT today 08/09/20-tachypneic and hypertensive with SBT. Asynchronous.FiO2 back up to 50% 10/7 severeresp failure, attempt SAT/SBT 10/7 extubated to biPAP 10/8 Tachycardic , more confused,patient re-intubated ETT 8.0 10/9 remains on vent 10/11 severe resp failure on vent 10/12 failed weaning trial 10/12 successfully extubated    CC  follow up respiratory failure  SUBJECTIVE Alert and awake No distress    BP (!) 140/95   Pulse 95   Temp 98.6 F (37 C) (Axillary)   Resp (!) 25   Ht 5\' 5"  (1.651 m)   Wt 109.9 kg   SpO2 100%   BMI 40.32 kg/m    I/O last 3 completed shifts: In: 1901.9 [P.O.:175; I.V.:1305.3; IV Piggyback:421.6] Out: 1017 [Urine:3515; Emesis/NG output:60] No intake/output data recorded.  SpO2: 100 % O2 Flow Rate (L/min): 6 L/min FiO2 (%): 30 %  Estimated body mass index is 40.32 kg/m as calculated from the following:   Height as of this encounter: 5\' 5"  (1.651 m).   Weight as of this encounter: 109.9 kg.   Review of Systems:  Gen:  Denies  fever, sweats, chills weight loss  HEENT: Denies blurred vision, double vision, ear pain, eye pain, hearing loss, nose bleeds, sore throat Cardiac:  No dizziness, chest pain or heaviness, chest tightness,edema, No JVD Resp:   No cough, -sputum production, -shortness of breath,-wheezing, -hemoptysis,  Other:  All other systems negative   Pressure Injury 08/13/20 Buttocks Right;Left B/L buttocks and scacrum, pink,red, and black (Active)  08/13/20 2000  Location: Buttocks  Location Orientation: Right;Left  Staging:   Wound Description (Comments): B/L buttocks and scacrum, pink,red, and  black  Present on Admission: No      Physical Examination:   General Appearance: No distress  Neuro:without focal findings,  speech normal,  HEENT: PERRLA, EOM intact.   Pulmonary: normal breath sounds, No wheezing.  CardiovascularNormal S1,S2.  No m/r/g.   Abdomen:  Benign, Soft, non-tender. Renal:  No costovertebral tenderness  GU:  Not performed at this time. Endoc: No evident thyromegaly Skin:   warm, no rashes, no ecchymosis  Extremities: normal, no cyanosis, clubbing. PSYCHIATRIC: Mood, affect within normal limits.   ALL OTHER ROS ARE NEGATIVE   MEDICATIONS: I have reviewed all medications and confirmed regimen as documented    Nutrition Status: Nutrition Problem: Inadequate oral intake Etiology: inability to eat Signs/Symptoms: NPO status Interventions: Tube feeding, MVI, Prostat     ASSESSMENT AND PLAN SYNOPSIS Acute hypoxemic respiratory failure due to COVID-19 pneumonia / ARDScomplicated by renal failure s/p extubation and re-intubated for acute aspiration pneumonia S/p extubation   Severe ACUTE Hypoxic and Hypercapnic Respiratory Failure resolving  Morbid obesity, possible OSA.   Will certainly impact respiratory mechanics  CARDIAC ICU monitoring  ID -continue IV abx as prescibed -follow up cultures   DIET-->SPEECH THERAPY Constipation protocol as indicated  ENDO - will use ICU hypoglycemic\Hyperglycemia protocol if indicated     ELECTROLYTES -follow labs as needed -replace as needed -pharmacy consultation and following   DVT/GI PRX ordered and assessed TRANSFUSIONS AS NEEDED MONITOR FSBS I Assessed the need for Labs I Assessed the need for Foley I Assessed the need for Central Venous Line Family Discussion when available I Assessed the need for Mobilization I made an Assessment of medications to be adjusted accordingly Safety Risk assessment completed   CASE DISCUSSED IN MULTIDISCIPLINARY ROUNDS WITH ICU TEAM  SD status, transfer to [facility] service 10/14  Courtney Anderson, M.D.  [facility]  Medical Director [place] Director [place] Department

## 2020-08-16 NOTE — Progress Notes (Signed)
[place] for Heparin (switch from Eliquis) Indication: atrial fibrillation  Allergies  Allergen Reactions   Codeine    Patient Measurements: Height: 5\' 5"  (165.1 cm) Weight: 109.9 kg (242 lb 4.6 oz) IBW/kg (Calculated) : 57 HEPARIN DW (KG): 73.7  Vital Signs: Temp: 98.6 F (37 C) (10/13 2000) Temp Source: Oral (10/13 2000) BP: 128/75 (10/13 2200) Pulse Rate: 99 (10/13 2245)  Labs: Recent Labs    08/14/20 0413 08/14/20 0413 08/15/20 0515 08/16/20 0456 08/16/20 0616 08/16/20 1347 08/16/20 2222  HGB 10.8*   < > 10.9*  --  11.4*  --   --   HCT 35.8*  --  34.9*  --  35.9*  --   --   PLT 158  --  155  --  167  --   --   APTT  --   --   --   --  48* 59* 87*  CREATININE 0.92  --  0.97 0.88  --   --   --    < > = values in this interval not displayed.    Estimated Creatinine Clearance: 78.7 mL/min (by C-G formula based on SCr of 0.88 mg/dL).   Medical History: Past Medical History:  Diagnosis Date   Allergy    Elevated transaminase level    Hyperlipidemia    Hypertension    Obesity    Assessment: Switching from Eliquis to IV Heparin as pt is now NPO.  Will use aPTT to adjust Heparin infusion. Last Eliquis 5mg  dose was 10/12 at 0900  Goal of Therapy:  APTT 66-102 Monitor platelets by anticoagulation protocol: Yes   Plan:  10/13 2222 APTT 87 therapeutic x 1. Continue heparin drip at 1250 units/hr. Recheck APTT in am to confirm. CBC daily.  Ena Dawley, PharmD 08/16/2020,11:16 PM

## 2020-08-16 NOTE — Progress Notes (Signed)
Assisted tele visit to patient with family member.  Ariel Dimitri D Jimie Kuwahara, RN   

## 2020-08-16 NOTE — Progress Notes (Signed)
[facility] Card[facility]  SUBJECTIVE: Patient extubated, able to verbalize, confused, but follows commands   Vitals:   08/16/20 0300 08/16/20 0318 08/16/20 0400 08/16/20 0500  BP: 134/85  132/75 (!) 140/95  Pulse: 98  90 95  Resp: (!) 23  (!) 26 (!) 25  Temp:  98.6 F (37 C)    TempSrc:  Axillary    SpO2: 100%  100% 100%  Weight:      Height:         Intake/Output Summary (Last 24 hours) at 08/16/2020 0814 Last data filed at 08/16/2020 0500 Gross per 24 hour  Intake 1255.93 ml  Output 2320 ml  Net -1064.07 ml      PHYSICAL EXAM  General: Well developed, well nourished, in no acute distress HEENT:  Normocephalic and atramatic Neck:  No JVD.  Lungs: Clear bilaterally to auscultation and percussion. Heart: Irregular irregular rhythm. Normal S1 and S2 without gallops or murmurs.  Abdomen: Bowel sounds are positive, abdomen soft and non-tender  Msk:  Back normal, normal gait. Normal strength and tone for age. Extremities: No clubbing, cyanosis or edema.   Neuro: Alert and oriented X 3. Psych:  Good affect, responds appropriately   LABS: Basic Metabolic Panel: Recent Labs    08/15/20 0515 08/16/20 0456  NA 141 139  K 2.9* 2.8*  CL 100 97*  CO2 29 31  GLUCOSE 100* 88  BUN 12 10  CREATININE 0.97 0.88  CALCIUM 9.5 9.3  MG 1.9 1.8  PHOS 2.5 2.1*   Liver Function Tests: Recent Labs    08/15/20 0515 08/16/20 0456  AST 27 26  ALT 35 29  ALKPHOS 60 58  BILITOT 1.1 1.1  PROT 6.3* 6.0*  ALBUMIN 2.7* 2.5*   No results for input(s): LIPASE, AMYLASE in the last 72 hours. CBC: Recent Labs    08/15/20 0515 08/16/20 0616  WBC 7.2 6.6  NEUTROABS 5.1 4.6  HGB 10.9* 11.4*  HCT 34.9* 35.9*  MCV 89.5 87.3  PLT 155 167   Cardiac Enzymes: No results for input(s): CKTOTAL, CKMB, CKMBINDEX, TROPONINI in the last 72 hours. BNP: Invalid input(s): POCBNP D-Dimer: No results for input(s): DDIMER in the last 72 hours. Hemoglobin A1C: No results for input(s): HGBA1C in the  last 72 hours. Fasting Lipid Panel: Recent Labs    08/14/20 1445  TRIG 86   Thyroid Function Tests: No results for input(s): TSH, T4TOTAL, T3FREE, THYROIDAB in the last 72 hours.  Invalid input(s): FREET3 Anemia Panel: No results for input(s): VITAMINB12, FOLATE, FERRITIN, TIBC, IRON, RETICCTPCT in the last 72 hours.  No results found.   Echo LVEF 45 to 50%  TELEMETRY: Atrial fibrillation at 120 bpm:  ASSESSMENT AND PLAN:  Principal Problem:   Sepsis ([place]) Active Problems:   Hypertension   Morbid obesity (HCC)   Hypercalcemia   Hyperthyroidism   AKI (acute kidney injury) ([place])   COVID-19 virus infection   Atrial fibrillation with RVR (HCC)   Hypokalemia    1.  Persistent atrial fibrillation with rapid ventricular rate, exacerbated by probable aspiration pneumonia and recurrent respiratory failure with intubation, on amiodarone drip,modestlyimproved after addition of IV digoxin, now reextubated, Eliquis held until patient can safely swallow 2.COVID-19 pneumonia/sepsis, extubated, followed by vomiting and probable aspiration pneumoniawith recurrent respiratory failure requiring reintubation, now extubated  Recommendations  1.Agree with current therapy 2.Continue amiodarone infusion, transition to p.o. when able to swallow safely 3.Continue dig 0.25 mg IV daily, transition to p.o. when able to swallow safely 4.Resume Eliquis 5  mg twice daily when able to swallow safely    Isaias Cowman, MD, PhD, [facility] 08/16/2020 8:14 AM

## 2020-08-16 NOTE — Evaluation (Signed)
Physical Therapy Evaluation Patient Details Name: TUCKER STEEDLEY MRN: [id number] DOB: 1955-11-10 Today's Date: 08/16/2020   History of Present Illness  Kennis Buell is a 13yoF PMH: HTN, hypoTSH, who comes [facility] on [facility]20 after 1 weeks of cough, congestion, weakness, intermittent fever chills. Pt admitted with code sepsis, (+) COVID19. Pt having issues with AF/RVR upon arrival. Pt evaluated by PT on 9/23, then transfered to ICU due to decline in respiratory status. New order for PT received 10/13.  Clinical Impression  Pt admitted with above diagnosis. Pt currently with functional limitations due to the deficits listed below (see "PT Problem List"). Per chart review, most recent K+: 2.8, hence author planned on examination with limited exertion and close monitoring of HR. Upon entry, pt in bed, asleep. Pt easily made awake, remains drowsy but interactive for first 75% of session, but grows more lethargic. Pt responsive to questioning and conversational, but is tangential in responses, rarely responding on-topic. Pt follows simple commands with poor consistency. History collected from patient evaluation performed 2 weeks ago prior to being moved to ICU. Pt has global weakness in 4 limbs, and what appears to be spasticity in Right biceps brachii and right wrist extensors, however mentation limits quality of exam. Grips are very weak bilat, but = bilat. Pt struggles to bring hand to chin, essentially unable. VSS on 5L HFNC, HR in AF c RVR 110-125bpm. Functional mobility assessment demonstrates increased effort/time requirements, poor tolerance, and need for physical assistance, whereas the patient performed these at a higher level of independence PTA. Pt will benefit from skilled PT intervention to increase independence and safety with basic mobility in preparation for discharge to the venue listed below.       Follow Up Recommendations SNF;Supervision for mobility/OOB;Supervision/Assistance - 24 hour     Equipment Recommendations  None recommended by PT    Recommendations for Other Services       Precautions / Restrictions Precautions Precautions: Fall Restrictions Weight Bearing Restrictions: No      Mobility  Bed Mobility Overal bed mobility:  (deferred; pt too confused and difficutly following commands)                Transfers                    Ambulation/Gait                Stairs            Wheelchair Mobility    Modified Rankin (Stroke Patients Only)       Balance                                             Pertinent Vitals/Pain Pain Assessment: No/denies pain    Home Living Family/patient expects to be discharged to:: Private residence Living Arrangements: Children Available Help at Discharge: Family Type of Home: Mobile home Home Access: Stairs to enter   Technical brewer of Steps: 8 Home Layout: One level Home Equipment: None      Prior Function Level of Independence: Independent         Comments: reportedly was driving, independent with IADL, ADL, but has been more homebound since COVID emerged as a local threat     Hand Dominance   Dominant Hand: Right    Extremity/Trunk Assessment   Upper Extremity Assessment Upper Extremity Assessment: Generalized  weakness;Difficult to assess due to impaired cognition (hypertonicity in biceps and wrist extensors)    Lower Extremity Assessment Lower Extremity Assessment: Generalized weakness;Difficult to assess due to impaired cognition (no abnomality of BLE ROM, but sleepy and resists heel slides bilat)       Communication      Cognition Arousal/Alertness: Lethargic Behavior During Therapy:  (tangential, confused) Overall Cognitive Status: Within Functional Limits for tasks assessed                                        General Comments      Exercises Low Level/ICU Exercises Ankle Circles/Pumps: PROM;Both;10  reps;Supine Short Arc Quad: PROM;Both;5 reps;Supine Hip ABduction/ADduction: PROM;Both;5 reps;Supine;Limitations Hip Abduction/Adduction Limitations: pt resisting Heel Slides: PROM;Both;5 reps;Supine;Limitations Heel Slides Limitations: hip extensors remain guarded, pt does not allow heel slide (pt mostly alseep at this point) Shoulder Flexion: PROM;Both;Supine   Assessment/Plan    PT Assessment Patient needs continued PT services  PT Problem List Decreased activity tolerance;Decreased knowledge of use of DME;Decreased mobility;Cardiopulmonary status limiting activity       PT Treatment Interventions DME instruction;Balance training;Gait training;Stair training;Functional mobility training;Therapeutic activities;Therapeutic exercise;Patient/family education    PT Goals (Current goals can be found in the Care Plan section)  Acute Rehab PT Goals Patient Stated Goal: return to home, feel better PT Goal Formulation: With patient Time For Goal Achievement: 08/30/20 Potential to Achieve Goals: Fair    Frequency Min 2X/week   Barriers to discharge        Co-evaluation               AM-PAC PT "6 Clicks" Mobility  Outcome Measure Help needed turning from your back to your side while in a flat bed without using bedrails?: A Lot Help needed moving from lying on your back to sitting on the side of a flat bed without using bedrails?: A Lot Help needed moving to and from a bed to a chair (including a wheelchair)?: A Lot Help needed standing up from a chair using your arms (e.g., wheelchair or bedside chair)?: A Lot Help needed to walk in hospital room?: Total Help needed climbing 3-5 steps with a railing? : Total 6 Click Score: 10    End of Session Equipment Utilized During Treatment: Oxygen Activity Tolerance: Patient limited by lethargy;No increased pain;Patient tolerated treatment well Patient left: in bed;with call bell/phone within reach Nurse Communication: Mobility  status PT Visit Diagnosis: Other abnormalities of gait and mobility (R26.89);Difficulty in walking, not elsewhere classified (R26.2)    Time: 7893-8101 PT Time Calculation (min) (ACUTE ONLY): 12 min   Charges:   PT Evaluation $PT Eval High Complexity: 1 High PT Treatments $Therapeutic Activity: 8-22 mins        4:25 PM, 08/16/20 Etta Grandchild, PT, DPT Physical Therapist - [facility]  [phone] ([place])   Lalla Laham C 08/16/2020, 4:20 PM

## 2020-08-16 NOTE — Progress Notes (Signed)
OT Cancellation Note  Patient Details Name: Courtney Anderson MRN: [id number] DOB: 1956/05/15   Cancelled Treatment:    Reason Eval/Treat Not Completed: Medical issues which prohibited therapy. Pt's most recent K+ level = 2.8, which is outside the range for which therapy services are indicated. Will follow pt's lab levels and will evaluate for OT at a later time/date, when medically appropriate.  Josiah Lobo, PhD, [place], OTR/L ascom [phone] 08/16/20, 8:23 AM

## 2020-08-16 NOTE — Progress Notes (Addendum)
[place] for Electrolyte Monitoring and Replacement   Recent Labs: Potassium (mmol/L)  Date Value  08/16/2020 2.8 (L)   Magnesium (mg/dL)  Date Value  08/16/2020 1.8   Calcium (mg/dL)  Date Value  08/16/2020 9.3   Calcium, Total (PTH) (mg/dL)  Date Value  07/24/2020 10.1   Albumin (g/dL)  Date Value  08/16/2020 2.5 (L)  10/22/2019 4.2   Phosphorus (mg/dL)  Date Value  08/16/2020 2.1 (L)   Sodium (mmol/L)  Date Value  08/16/2020 139  10/22/2019 139    Assessment: 64 year old female with PMHx of HTN, HLD who was admitted with COVID-19 PNA, also with A-fib with RVR and severe intertrigo.  Patient extubated 10/12. Remains NPO pending speech eval which patient has refused.  Goal of Therapy:  Potassium 4.0 - 5.1 mmol/L Magnesium 2.0 - 2.4 mg/dL All Other Electrolytes WNL  Plan:   KCl 10 mEq IV x 6  Magnesium sulfate 2 grams  IV x 1  Will order repeat potassium this evening as patient has remained consistently hypokalemic despite replacement  F/U electrolytes in am  Tawnya Crook ,PharmD Clinical Pharmacist 08/16/2020 2:02 PM

## 2020-08-17 DIAGNOSIS — E876 Hypokalemia: Secondary | ICD-10-CM

## 2020-08-17 DIAGNOSIS — A419 Sepsis, unspecified organism: Secondary | ICD-10-CM | POA: Diagnosis not present

## 2020-08-17 DIAGNOSIS — U071 COVID-19: Secondary | ICD-10-CM | POA: Diagnosis not present

## 2020-08-17 DIAGNOSIS — I4891 Unspecified atrial fibrillation: Secondary | ICD-10-CM | POA: Diagnosis not present

## 2020-08-17 LAB — GLUCOSE, CAPILLARY
Glucose-Capillary: 90 mg/dL (ref 70–99)
Glucose-Capillary: 78 mg/dL (ref 70–99)
Glucose-Capillary: 82 mg/dL (ref 70–99)
Glucose-Capillary: 93 mg/dL (ref 70–99)
Glucose-Capillary: 103 mg/dL — ABNORMAL HIGH (ref 70–99)
Glucose-Capillary: 106 mg/dL — ABNORMAL HIGH (ref 70–99)
Glucose-Capillary: 106 mg/dL — ABNORMAL HIGH (ref 70–99)

## 2020-08-17 LAB — COMPREHENSIVE METABOLIC PANEL
ALT: 30 U/L (ref 0–44)
AST: 28 U/L (ref 15–41)
Albumin: 2.7 g/dL — ABNORMAL LOW (ref 3.5–5.0)
Alkaline Phosphatase: 66 U/L (ref 38–126)
Anion gap: 13 (ref 5–15)
BUN: 8 mg/dL (ref 8–23)
CO2: 30 mmol/L (ref 22–32)
Calcium: 9.2 mg/dL (ref 8.9–10.3)
Chloride: 94 mmol/L — ABNORMAL LOW (ref 98–111)
Creatinine, Ser: 0.89 mg/dL (ref 0.44–1.00)
GFR, Estimated: >60 mL/min (ref >60)
Glucose, Bld: 90 mg/dL (ref 70–99)
Potassium: 3.2 mmol/L — ABNORMAL LOW (ref 3.5–5.1)
Sodium: 137 mmol/L (ref 135–145)
Total Bilirubin: 1.4 mg/dL — ABNORMAL HIGH (ref 0.3–1.2)
Total Protein: 6.9 g/dL (ref 6.5–8.1)

## 2020-08-17 LAB — PHOSPHORUS: Phosphorus: 2.1 mg/dL — ABNORMAL LOW (ref 2.5–4.6)

## 2020-08-17 LAB — APTT: aPTT: 87 seconds — ABNORMAL HIGH (ref 24–36)

## 2020-08-17 LAB — CBC
HCT: 33.9 % — ABNORMAL LOW (ref 36.0–46.0)
Hemoglobin: 11 g/dL — ABNORMAL LOW (ref 12.0–15.0)
MCH: 28.1 pg (ref 26.0–34.0)
MCHC: 32.4 g/dL (ref 30.0–36.0)
MCV: 86.7 fL (ref 80.0–100.0)
Platelets: 163 10*3/uL (ref 150–400)
RBC: 3.91 MIL/uL (ref 3.87–5.11)
RDW: 16.1 % — ABNORMAL HIGH (ref 11.5–15.5)
WBC: 6.4 10*3/uL (ref 4.0–10.5)
nRBC: 0.3 % — ABNORMAL HIGH (ref 0.0–0.2)

## 2020-08-17 LAB — BLOOD GAS, ARTERIAL

## 2020-08-17 LAB — MAGNESIUM: Magnesium: 1.9 mg/dL (ref 1.7–2.4)

## 2020-08-17 MED ORDER — CHLORHEXIDINE GLUCONATE 0.12 % MT SOLN
15.0000 mL | Freq: Two times a day (BID) | OROMUCOSAL | Status: DC
  Administered 2020-08-18 (×2): 15 mL via OROMUCOSAL

## 2020-08-17 MED ORDER — HEPARIN (PORCINE) 25000 UT/250ML-% IV SOLN
1350.0000 [IU]/h | INTRAVENOUS | Status: DC
  Administered 2020-08-19: 1350 [IU]/h via INTRAVENOUS
  Filled 2020-08-17 (×2): qty 250

## 2020-08-17 MED ORDER — DIGOXIN 250 MCG PO TABS
0.2500 mg | ORAL_TABLET | Freq: Every day | ORAL | Status: DC
  Filled 2020-08-17: qty 1

## 2020-08-17 MED ORDER — ORAL CARE MOUTH RINSE
15.0000 mL | Freq: Two times a day (BID) | OROMUCOSAL | Status: DC
  Administered 2020-08-19 – 2020-08-27 (×12): 15 mL via OROMUCOSAL

## 2020-08-17 MED ORDER — ENSURE ENLIVE PO LIQD: 237.0000 mL | Freq: Three times a day (TID) | ORAL | Status: DC

## 2020-08-17 MED ORDER — HEPARIN BOLUS VIA INFUSION
2000.0000 [IU] | Freq: Once | INTRAVENOUS | Status: DC
  Filled 2020-08-17: qty 2000

## 2020-08-17 MED ORDER — APIXABAN 5 MG PO TABS: 5.0000 mg | ORAL_TABLET | Freq: Two times a day (BID) | ORAL | Status: DC

## 2020-08-17 MED ORDER — AMIODARONE HCL 200 MG PO TABS
200.0000 mg | ORAL_TABLET | Freq: Two times a day (BID) | ORAL | Status: DC
  Filled 2020-08-17: qty 1

## 2020-08-17 MED ORDER — POTASSIUM PHOSPHATES 15 MMOLE/5ML IV SOLN
10.0000 mmol | Freq: Once | INTRAVENOUS | Status: AC
  Administered 2020-08-17: 10 mmol via INTRAVENOUS
  Filled 2020-08-17: qty 3.33

## 2020-08-17 NOTE — Evaluation (Addendum)
Clinical/Bedside Swallow Evaluation Patient Details  Name: Courtney Anderson MRN: [id number] Date of Birth: April 06, 1956  Today's Date: 08/17/2020 Time: SLP Start Time (ACUTE ONLY): 1200 SLP Stop Time (ACUTE ONLY): 1300 SLP Time Calculation (min) (ACUTE ONLY): 60 min  Past Medical History:  Past Medical History:  Diagnosis Date  . Allergy   . Elevated transaminase level   . Hyperlipidemia   . Hypertension   . Obesity    Past Surgical History:  Past Surgical History:  Procedure Laterality Date  . BREAST SURGERY    . ECTOPIC PREGNANCY SURGERY     HPI:  Pt is a 64 y.o. female with medical history significant for morbid Obesity, hypertension, hypothyroidism, dyslipidemia who was brought into the ER by EMS as a "CODE SEPSIS".  Patient states that she has not felt well in over a week and complains of a cough, congestion and generalized weakness.  She also had intermittent fever and chills.  She states that she is usually able to care for herself but over the last 1 week has been unable to because she has felt unwell.  In the field she was said to be hypoxic and required a nonrebreather mask, in the ER she was found to have room air pulse oximetry of 69% and remains on a nonrebreather mask which has improved her pulse oximetry to the low 90s.  She was noted to be in rapid atrial fibrillation upon arrival.  She has acute kidney injury 0.97 >> 2.81, lactic acid is elevated at 3.9 and she has leukocytosis with a left shift.  Her calcium level is elevated at 11.4 when corrected for her low albumin.  Chest x-ray showed air bronchograms in the right lower lobe and her COVID-19 PCR is positive.  Pt was orally intubated 9/26-10/10/2020.  Currently, she is Confused w/ mumbled/mutterd speech; often refusing po's despite encouragement; disheveled in the bed and cannot help to sit herself upright.   Assessment / Plan / Recommendation Clinical Impression  Pt appears to present w/ potential oral phase dysphagia in  light of Significantly declined Cognitive status; this impacts her overall awareness/engagement and safety during po taskswhich can increase risk for aspiration, choking. No overt clinical s/s of aspiration or pharyngeal phase dysphagia noted during po trials; risk for aspiration can be reduced when following general aspiration precautions and given feeding assistance. She requires Mod tactile/verbal/ visualcues for orientation to bolus presentation, and acceptance of po trials during feeding (she often maintained closed eyes and mumbled/muttered speech during the eval but alerted when pinched straw/bolus was placed at lips). Despite Mod+ encouragement, pt consumed only few trials of ice chip, purees, and TSP/Cup/pinched straw sips of thin liquids w/ No immediate, overt clinical s/s of aspiration noted; no decline in vocal quality during few verbalizations, no cough, and no decline in respiratory status during/post trials -- O2 sats 96%. Oral phase was adequate for bolus management and oral clearing of the boluses given. She is missing Dentition. She was not agreeable to taking any po's other than few given stating "it is spoiled". OM Exam was cursory/observation, but No unilateral weakness noted. Missing Dentition. Pt appeared confused w/ attempted oral care and bit on the swab stick then turned head away. Full Feeding Support given; verbal encouragement.D/t pt's declined Cognitive status, her risk for aspiration, recommend initiation of the dysphagia level 1(puree) w/ Thin liquids w/ aspiration precautions; reduce Distractions during meals and only give po's when pt is calm and participative. Pills Crushed in Puree for safer swallowing. Support  w/ feeding at meals -- check for oral clearing during/post intake. NSG updated. ST services will f/u w/ toleration of diet while admitted. Recommend Palliative Care f/u for GOC as she does not want to take po's often. If pt becomes approriate/ready for diet consistency  upgrade, ST services at next venue of care can follow w/ pt there. Precautions posted in room. SLP Visit Diagnosis: Dysphagia, oral phase (R13.11) (Cognitive decline)    Aspiration Risk  Mild aspiration risk;Risk for inadequate nutrition/hydration (d/t Cognitive decline; reduced w/ precautions)    Diet Recommendation  Dysphagia level 1 (puree) d/t Cognitive decline w/ Thin liquids; Strict Aspiration precautions; Full Feeding support and encouragement. Stop feeding if not alert to attend.   Medication Administration: Crushed with puree (for safer, ease of swallowing)    Other  Recommendations Recommended Consults:  (Palliative Care; Dietician) Oral Care Recommendations: Oral care BID;Oral care before and after PO;Staff/trained caregiver to provide oral care Other Recommendations:  (n/a)   Follow up Recommendations Skilled Nursing facility (TBD)      Frequency and Duration min 2x/week  2 weeks       Prognosis Prognosis for Safe Diet Advancement: Fair Barriers to Reach Goals: Cognitive deficits;Time post onset;Severity of deficits;Behavior      Swallow Study   General Date of Onset: 07/24/20 HPI: Pt is a 64 y.o. female with medical history significant for morbid Obesity, hypertension, hypothyroidism, dyslipidemia who was brought into the ER by EMS as a "CODE SEPSIS".  Patient states that she has not felt well in over a week and complains of a cough, congestion and generalized weakness.  She also had intermittent fever and chills.  She states that she is usually able to care for herself but over the last 1 week has been unable to because she has felt unwell.  In the field she was said to be hypoxic and required a nonrebreather mask, in the ER she was found to have room air pulse oximetry of 69% and remains on a nonrebreather mask which has improved her pulse oximetry to the low 90s.  She was noted to be in rapid atrial fibrillation upon arrival.  She has acute kidney injury 0.97 >> 2.81,  lactic acid is elevated at 3.9 and she has leukocytosis with a left shift.  Her calcium level is elevated at 11.4 when corrected for her low albumin.  Chest x-ray showed air bronchograms in the right lower lobe and her COVID-19 PCR is positive.  Pt was orally intubated 9/26-10/10/2020.  Currently, she is Confused w/ mumbled/mutterd speech; often refusing po's despite encouragement.  Type of Study: Bedside Swallow Evaluation Previous Swallow Assessment: none Diet Prior to this Study: NPO Temperature Spikes Noted: No (wbc 6.4) Respiratory Status: Nasal cannula (4-6L) History of Recent Intubation: Yes Length of Intubations (days): 17 days Date extubated: 08/15/20 Behavior/Cognition: Confused;Lethargic/Drowsy;Distractible;Requires cueing;Uncooperative Oral Cavity Assessment: Dry Oral Care Completed by SLP: Yes Oral Cavity - Dentition: Missing dentition Vision:  (n/a) Self-Feeding Abilities: Total assist Patient Positioning: Upright in bed (full positioning required) Baseline Vocal Quality: Low vocal intensity (mumbled/muttered speech) Volitional Cough: Cognitively unable to elicit Volitional Swallow: Unable to elicit    Oral/Motor/Sensory Function Overall Oral Motor/Sensory Function:  (appeared grossly wfl w/ bolus management)   Ice Chips Ice chips: Within functional limits Presentation: Spoon (fed; 5 trials) Other Comments: she munched on the chips; inattentive   Thin Liquid Thin Liquid: Impaired (min w/ oral phase) Presentation: Cup;Spoon;Straw (pinched straw then sucks; 8 trials total) Oral Phase Impairments: Poor awareness of  bolus Oral Phase Functional Implications:  (none) Pharyngeal  Phase Impairments:  (none noted)    Nectar Thick Nectar Thick Liquid: Not tested   Honey Thick Honey Thick Liquid: Not tested   Puree Puree: Within functional limits Presentation: Spoon (fed; 2 trials accepted)   Solid     Solid: Not tested Other Comments: poor cognitive status        [name], MS, [employer] Speech Language Pathologist Re[facility] [phone] [facility] 08/17/2020,3:42 PM

## 2020-08-17 NOTE — Progress Notes (Signed)
ANTICOAGULATION CONSULT NOTE - Initial Consult  Pharmacy Consult for Heparin Driip Indication: atrial fibrillation  Allergies  Allergen Reactions  . Codeine     Patient Measurements: Height: 5\' 5"  (165.1 cm) Weight: 109.9 kg (242 lb 4.6 oz) IBW/kg (Calculated) : 57 Heparin Dosing Weight: 73.7 kg  Vital Signs: Temp: 96.5 F (35.8 C) (10/14 2000) Temp Source: Axillary (10/14 2000) BP: 151/79 (10/14 2000) Pulse Rate: 98 (10/14 2115)  Labs: Recent Labs    08/15/20 0515 08/15/20 0515 08/16/20 0456 08/16/20 0616 08/16/20 0616 08/16/20 1347 08/16/20 2222 08/17/20 0509  HGB 10.9*   < >  --  11.4*  --   --   --  11.0*  HCT 34.9*  --   --  35.9*  --   --   --  33.9*  PLT 155  --   --  167  --   --   --  163  APTT  --   --   --  48*   < > 59* 87* 87*  CREATININE 0.97  --  0.88  --   --   --   --  0.89   < > = values in this interval not displayed.    Estimated Creatinine Clearance: 77.8 mL/min (by C-G formula based on SCr of 0.89 mg/dL).   Medical History: Past Medical History:  Diagnosis Date  . Allergy   . Elevated transaminase level   . Hyperlipidemia   . Hypertension   . Obesity     Assessment: Patient was started on Heparin drip for afib, Heparin was discontinued ~18:00 this afternoon and patient was to resume apixaban, however patient was too lethargic to swallow. Pharmacy consulted to resume Heparin.  Goal of Therapy:  Heparin level 0.3-0.7 units/ml aPTT 66-102 seconds Monitor platelets by anticoagulation protocol: Yes   Plan:  Will resume Heparin at previous rate of 1350 units/hr after a half bolus of 2000 units. Will check aPTT and HL in 6 hours. Daily CBC while on Heparin drip.  Paulina Fusi, PharmD, BCPS 08/17/2020 9:45 PM

## 2020-08-17 NOTE — Progress Notes (Signed)
Power for Heparin (switch from Eliquis) Indication: atrial fibrillation  Allergies  Allergen Reactions  . Codeine    Patient Measurements: Height: 5\' 5"  (165.1 cm) Weight: 109.9 kg (242 lb 4.6 oz) IBW/kg (Calculated) : 57 HEPARIN DW (KG): 73.7  Vital Signs: Temp: 98 F (36.7 C) (10/14 0400) Temp Source: Oral (10/14 0400) BP: 146/87 (10/14 0400) Pulse Rate: 91 (10/14 0400)  Labs: Recent Labs    08/15/20 0515 08/15/20 0515 08/16/20 0456 08/16/20 0616 08/16/20 0616 08/16/20 1347 08/16/20 2222 08/17/20 0509  HGB 10.9*   < >  --  11.4*  --   --   --  11.0*  HCT 34.9*  --   --  35.9*  --   --   --  33.9*  PLT 155  --   --  167  --   --   --  163  APTT  --   --   --  48*   < > 59* 87* 87*  CREATININE 0.97  --  0.88  --   --   --   --   --    < > = values in this interval not displayed.    Estimated Creatinine Clearance: 78.7 mL/min (by C-G formula based on SCr of 0.88 mg/dL).   Medical History: Past Medical History:  Diagnosis Date  . Allergy   . Elevated transaminase level   . Hyperlipidemia   . Hypertension   . Obesity    Assessment: Switching from Eliquis to IV Heparin as pt is now NPO.  Will use aPTT to adjust Heparin infusion. Last Eliquis 5mg  dose was 10/12 at 0900  Goal of Therapy:  APTT 66-102 Monitor platelets by anticoagulation protocol: Yes   Plan:  10/13 2222 APTT 87 therapeutic x 1. Continue heparin drip at 1250 units/hr. Recheck APTT in am to confirm. CBC daily.  10/14 0509 aPTT 87, therapeutic x 2.  CBC stable.  Continue current rate and recheck aPTT and CBC in am.  Ena Dawley, PharmD 08/17/2020,5:52 AM

## 2020-08-17 NOTE — Progress Notes (Signed)
PROGRESS NOTE    ARELENE MORONI  BJS:[id number] DOB: 1955/12/30 DOA: 07/24/2020 PCP: Guadalupe Maple, MD    Brief Narrative:  Nancee Brownrigg Wattis an 64 y.o.female.  [id number] is a 64 year old female with history noted below, who was admitted to [facility] on 2020-07-24 brought in via EMS as a "code sepsis". Patient subsequently tested positive for COVID-19. She presented with oxygen saturations of 69% on room air and had to be placed on nonrebreather mask to achieve oxygen saturations in the 90s. She was subsequently admitted to the Covid unit for management of COVID-19 pneumonia. She was noted to have acute kidney injury she was treated with remdesivir, zinc, vitamin C and antitussives. She was placed empirically on Rocephin and azithromycin. She continued to have increased FiO2 requirements. She was refusing to self prone. She had atrial fibrillation and was seen by cardiologyto assist in management.   CC: f/u respiratory failure  07/31/20- patient is on 90% FiO2, will modify meds as patient is on metoprolol and levophed as well as cardizem. Will d/c propofol to improve SBP and remove arythmogenic vasopressor if able. Plan to have CVP trending and diuresis today. Goals of care discussion today - patient made DNR per sister and son of patient. I had 2 separate phone meetings with son and separate conversation with sister today to explain patient is worse with shock and renal failure requiring multiple pressors.   08/01/20- patient remains critically ill. FiO2 has been weaned down to 80% today.  08/03/20- patient remains critically ill shes being proned, weaning FiO2 as able.  08/04/20-patient with no interval changes continue with weaning and proning protocol. Continue weaning FiO2 and PEEP as to optimize for SBT 08/06/20- patient is weaned down 40%FiO2 today, will attempt awakening trial with SBT, I spoke to son [name] today, he appreciates update and does not want Elink video view  at this time.  08/07/20- patient is requiring 50%FiO2 today 08/08/20- Continuing to wean vent. FiO2 is 28%, will trial SBT today 08/09/20-tachypneic and hypertensive with SBT. Asynchronous.FiO2 back up to 50% 10/7 severeresp failure, attempt SAT/SBT 10/7 extubated to biPAP 10/8 Tachycardic , more confused,patient re-intubated ETT 8.0 10/9 remains on vent 10/11 severe resp failure on vent 10/12 failed weaning trial 10/12 successfully extubated 10/14 TRANSFER TO TRH   Consultants:   Pccm, cardiology  Procedures:  Antimicrobials:      Subjective: Pt appears sleepy, not really answering my questions as she falls asleep. Per nsg she was up last night.  Objective: Vitals:   08/17/20 0645 08/17/20 0800 08/17/20 1155 08/17/20 1200  BP:      Pulse: 94     Resp: (!) 27     Temp:  97.6 F (36.4 C)  (!) 97.5 F (36.4 C)  TempSrc:  Oral  Oral  SpO2: 98%  92%   Weight:      Height:        Intake/Output Summary (Last 24 hours) at 08/17/2020 1631 Last data filed at 08/17/2020 1500 Gross per 24 hour  Intake 2163.29 ml  Output 2490 ml  Net -326.71 ml   Filed Weights   08/05/20 0500 08/06/20 0412 08/12/20 0530  Weight: 108.4 kg 108.4 kg 109.9 kg    Examination:  General exam: Appears calm and comfortable , sleepy, not participating with exam Respiratory system: Clear to auscultation. Respiratory effort poor Cardiovascular system: S1 & S2 heard, RRR. No JVD, murmurs, rubs, gallops or clicks.  Gastrointestinal system: Abdomen is nondistended, soft and nontender. Normal bowel sounds  heard. Central nervous system: Unable to assess since patient is not participating with exam performed asleep Extremities: No edema Skin: Warm dry Psychiatry: Unable to assess    Data Reviewed: I have personally reviewed following labs and imaging studies  CBC: Recent Labs  Lab 08/12/20 0523 08/12/20 0523 08/13/20 0405 08/14/20 0413 08/15/20 0515 08/16/20 0616 08/17/20 0509  WBC  7.4   < > 6.4 7.1 7.2 6.6 6.4  NEUTROABS 5.8  --  4.7 5.8 5.1 4.6  --   HGB 10.0*   < > 10.5* 10.8* 10.9* 11.4* 11.0*  HCT 32.2*   < > 34.3* 35.8* 34.9* 35.9* 33.9*  MCV 91.5   < > 90.7 91.1 89.5 87.3 86.7  PLT 153   < > 144* 158 155 167 163   < > = values in this interval not displayed.   Basic Metabolic Panel: Recent Labs  Lab 08/13/20 0405 08/13/20 0405 08/14/20 0413 08/15/20 0515 08/16/20 0456 08/16/20 2222 08/17/20 0509  NA 147*  --  146* 141 139  --  137  K 3.2*   < > 3.5 2.9* 2.8* 2.8* 3.2*  CL 110  --  106 100 97*  --  94*  CO2 28  --  30 29 31   --  30  GLUCOSE 99  --  113* 100* 88  --  90  BUN 27*  --  17 12 10   --  8  CREATININE 0.93  --  0.92 0.97 0.88  --  0.89  CALCIUM 9.3  --  9.4 9.5 9.3  --  9.2  MG 1.9  --  1.8 1.9 1.8  --  1.9  PHOS 2.7  --  2.8 2.5 2.1*  --  2.1*   < > = values in this interval not displayed.   GFR: Estimated Creatinine Clearance: 77.8 mL/min (by C-G formula based on SCr of 0.89 mg/dL). Liver Function Tests: Recent Labs  Lab 08/13/20 0405 08/14/20 0413 08/15/20 0515 08/16/20 0456 08/17/20 0509  AST 18 23 27 26 28   ALT 42 39 35 29 30  ALKPHOS 49 55 60 58 66  BILITOT 0.5 0.7 1.1 1.1 1.4*  PROT 5.4* 6.0* 6.3* 6.0* 6.9  ALBUMIN 2.3* 2.6* 2.7* 2.5* 2.7*   No results for input(s): LIPASE, AMYLASE in the last 168 hours. No results for input(s): AMMONIA in the last 168 hours. Coagulation Profile: No results for input(s): INR, PROTIME in the last 168 hours. Cardiac Enzymes: No results for input(s): CKTOTAL, CKMB, CKMBINDEX, TROPONINI in the last 168 hours. BNP (last 3 results) No results for input(s): PROBNP in the last 8760 hours. HbA1C: No results for input(s): HGBA1C in the last 72 hours. CBG: Recent Labs  Lab 08/17/20 0318 08/17/20 0735 08/17/20 0746 08/17/20 1133 08/17/20 1611  GLUCAP 90 82 78 93 103*   Lipid Profile: No results for input(s): CHOL, HDL, LDLCALC, TRIG, CHOLHDL, LDLDIRECT in the last 72 hours. Thyroid  Function Tests: No results for input(s): TSH, T4TOTAL, FREET4, T3FREE, THYROIDAB in the last 72 hours. Anemia Panel: No results for input(s): VITAMINB12, FOLATE, FERRITIN, TIBC, IRON, RETICCTPCT in the last 72 hours. Sepsis Labs: No results for input(s): PROCALCITON, LATICACIDVEN in the last 168 hours.  No results found for this or any previous visit (from the past 240 hour(s)).       Radiology Studies: No results found.      Scheduled Meds: . vitamin C  500 mg Per Tube Daily  . aspirin  81 mg Per Tube Daily  .  Chlorhexidine Gluconate Cloth  6 each Topical Daily  . cholecalciferol  2,000 Units Per Tube Daily  . digoxin  0.25 mg Intravenous Daily  . insulin aspart  0-9 Units Subcutaneous Q4H  . multivitamin with minerals  1 tablet Per Tube Daily  . nystatin   Topical TID  . pneumococcal 23 valent vaccine  0.5 mL Intramuscular Tomorrow-1000  . zinc sulfate  220 mg Per Tube Daily   Continuous Infusions: . sodium chloride Stopped (08/12/20 1532)  . amiodarone 60 mg/hr (08/17/20 1127)  . famotidine (PEPCID) IV 20 mg (08/17/20 0831)  . fluconazole (DIFLUCAN) IV Stopped (08/16/20 1818)  . heparin 1,350 Units/hr (08/17/20 1128)    Assessment & Plan:   Principal Problem:   Sepsis ([place]) Active Problems:   Hypertension   Morbid obesity ([place])   Hypercalcemia   Hyperthyroidism   AKI (acute kidney injury) ([place])   COVID-19 virus infection   Atrial fibrillation with RVR ([place])   Hypokalemia   Acute hypoxemic respiratory failure due to COVID-19 pneumonia / ARDScomplicated by renal failure-  s/p extubation and re-intubated for acute aspiration pneumonia Resp . Failure resolving, now TRH will take over. 02 sat to keep 02 >92% Aspiration precautions    Morbid obesity, possible OSA.  -complicates medical issues and respiratory status Will need sleep study as outpt  AFIB WITH RVR Currently on amiodarone gtt and heparin gtt Also on IV digoxin Per nsg able to take po  now.  Cardiology saw patient recommended amiodarone p.o. transition 200 mg twice daily Digoxin 0.25 p.o. daily and Eliquis 5 mg twice daily We will stop amiodarone drip and heparin drip once taking Eliquis  Hypophosphatemia-we will place Monitor levels  Hypokalemia-K is 3.2 We will replace and monitor levels   DVT prophylaxis: heparin with transition to Eliquis Code Status: DNR Family Communication: None at bedside   Status is: Inpatient  Remains inpatient appropriate because:IV treatments appropriate due to intensity of illness or inability to take PO   Dispo: The patient is from: Home              Anticipated d/c is to: SNF              Anticipated d/c date is: 2 days              Patient currently is not medically stable to d/c. transitioning to po medications. Needs pt/ot assessment.            LOS: 24 days   Time spent: 45 min with >50% on coc    Nolberto Hanlon, MD [facility] Pager [phone]  If 7PM-7AM, please contact night-coverage [email or web address] Password [email or web address] 08/17/2020, 4:31 PM

## 2020-08-17 NOTE — Progress Notes (Signed)
[facility] Cardiology  SUBJECTIVE: Patient laying in bed, denies chest pain or shortness of breath, still not taking p.o. medications   Vitals:   08/17/20 0600 08/17/20 0615 08/17/20 0630 08/17/20 0645  BP: (!) 150/93     Pulse: 91 92 92 94  Resp: (!) 27 (!) 22 (!) 22 (!) 27  Temp:      TempSrc:      SpO2: 98% 99% 99% 98%  Weight:      Height:         Intake/Output Summary (Last 24 hours) at 08/17/2020 0742 Last data filed at 08/17/2020 0600 Gross per 24 hour  Intake 2013.58 ml  Output 3090 ml  Net -1076.42 ml      PHYSICAL EXAM  General: Well developed, well nourished, in no acute distress HEENT:  Normocephalic and atramatic Neck:  No JVD.  Lungs: Clear bilaterally to auscultation and percussion. Heart: HRRR . Normal S1 and S2 without gallops or murmurs.  Abdomen: Bowel sounds are positive, abdomen soft and non-tender  Msk:  Back normal, normal gait. Normal strength and tone for age. Extremities: No clubbing, cyanosis or edema.   Neuro: Alert and oriented X 3. Psych:  Good affect, responds appropriately   LABS: Basic Metabolic Panel: Recent Labs    08/16/20 0456 08/16/20 0456 08/16/20 2222 08/17/20 0509  NA 139  --   --  137  K 2.8*   < > 2.8* 3.2*  CL 97*  --   --  94*  CO2 31  --   --  30  GLUCOSE 88  --   --  90  BUN 10  --   --  8  CREATININE 0.88  --   --  0.89  CALCIUM 9.3  --   --  9.2  MG 1.8  --   --  1.9  PHOS 2.1*  --   --  2.1*   < > = values in this interval not displayed.   Liver Function Tests: Recent Labs    08/16/20 0456 08/17/20 0509  AST 26 28  ALT 29 30  ALKPHOS 58 66  BILITOT 1.1 1.4*  PROT 6.0* 6.9  ALBUMIN 2.5* 2.7*   No results for input(s): LIPASE, AMYLASE in the last 72 hours. CBC: Recent Labs    08/15/20 0515 08/15/20 0515 08/16/20 0616 08/17/20 0509  WBC 7.2   < > 6.6 6.4  NEUTROABS 5.1  --  4.6  --   HGB 10.9*   < > 11.4* 11.0*  HCT 34.9*   < > 35.9* 33.9*  MCV 89.5   < > 87.3 86.7  PLT 155   < > 167 163   <  > = values in this interval not displayed.   Cardiac Enzymes: No results for input(s): CKTOTAL, CKMB, CKMBINDEX, TROPONINI in the last 72 hours. BNP: Invalid input(s): POCBNP D-Dimer: No results for input(s): DDIMER in the last 72 hours. Hemoglobin A1C: No results for input(s): HGBA1C in the last 72 hours. Fasting Lipid Panel: Recent Labs    08/14/20 1445  TRIG 86   Thyroid Function Tests: No results for input(s): TSH, T4TOTAL, T3FREE, THYROIDAB in the last 72 hours.  Invalid input(s): FREET3 Anemia Panel: No results for input(s): VITAMINB12, FOLATE, FERRITIN, TIBC, IRON, RETICCTPCT in the last 72 hours.  No results found.   Echo LVEF 45 to 50%  TELEMETRY: Atrial fibrillation at 120 bpm:  ASSESSMENT AND PLAN:  Principal Problem:   Sepsis ([place]) Active Problems:   Hypertension  Morbid obesity ([place])   Hypercalcemia   Hyperthyroidism   AKI (acute kidney injury) ([place])   COVID-19 virus infection   Atrial fibrillation with RVR ([facility])   Hypokalemia    1.  Paroxysmal atrial fibrillation with rapid ventricular rate, exacerbated by probable aspiration pneumonia and recurrent respiratory failure with intubation, on amiodarone drip,modestlyimproved after addition of IV digoxin, now reextubated, Eliquis held until patient can safely swallow, on heparin drip.  She converted to sinus rhythm at a rate of 90 bpm. 2.COVID-19 pneumonia/sepsis, extubated, followed by vomiting and probable aspiration pneumoniawith recurrent respiratory failure requiring reintubation, now extubated  Recommendations  1.Agree with current therapy 2.Continue amiodarone infusion,transition to 200 mg p.o. twice daily when able to swallow safely 3.Continue dig 0.25 mg IV daily,transition to 0.25 g p.o. daily when able to swallow safely 4.Resume Eliquis 5 mg twice daily when able to swallow safely   Isaias Cowman, MD, PhD, [facility] 08/17/2020 7:42 AM

## 2020-08-17 NOTE — Progress Notes (Addendum)
CRITICAL CARE NOTE  Severe respiratory distress and patient with COVID-19 pneumonia.  Courtney Anderson an 64 y.o.female.  [id number] is a 64 year old female with history noted below, who was admitted to [facility] on 2020-07-24 brought in via EMS as a "code sepsis". Patient subsequently tested positive for COVID-19. She presented with oxygen saturations of 69% on room air and had to be placed on nonrebreather mask to achieve oxygen saturations in the 90s. She was subsequently admitted to the Covid unit for management of COVID-19 pneumonia. She was noted to have acute kidney injury she was treated with remdesivir, zinc, vitamin C and antitussives. She was placed empirically on Rocephin and azithromycin. She continued to have increased FiO2 requirements. She was refusing to self prone. She had atrial fibrillation and was seen by cardiologyto assist in management.   07/31/20- patient is on 90% FiO2, will modify meds as patient is on metoprolol and levophed as well as cardizem. Will d/c propofol to improve SBP and remove arythmogenic vasopressor if able. Plan to have CVP trending and diuresis today. Goals of care discussion today - patient made DNR per sister and son of patient. I had 2 separate phone meetings with son and separate conversation with sister today to explain patient is worse with shock and renal failure requiring multiple pressors.   08/01/20- patient remains critically ill. FiO2 has been weaned down to 80% today.  08/03/20- patient remains critically ill shes being proned, weaning FiO2 as able.  08/04/20-patient with no interval changes continue with weaning and proning protocol. Continue weaning FiO2 and PEEP as to optimize for SBT 08/06/20- patient is weaned down 40%FiO2 today, will attempt awakening trial with SBT, I spoke to son [name] today, he appreciates update and does not want Elink video view at this time.  08/07/20- patient is requiring 50%FiO2 today 08/08/20-  Continuing to wean vent. FiO2 is 28%, will trial SBT today 08/09/20-tachypneic and hypertensive with SBT. Asynchronous.FiO2 back up to 50% 10/7 severeresp failure, attempt SAT/SBT 10/7 extubated to biPAP 10/8 Tachycardic , more confused,patient re-intubated ETT 8.0 10/9 remains on vent 10/11 severe resp failure on vent 10/12 failed weaning trial 10/12 successfully extubated 10/14 TRANSFER TO TR[facility]    CC  Follow up resp failure  SUBJECTIVE Alert and awake No distress    BP (!) 150/93   Pulse 94   Temp 98 F (36.7 C) (Oral)   Resp (!) 27   Ht 5\' 5"  (1.651 m)   Wt 109.9 kg   SpO2 98%   BMI 40.32 kg/m    I/O last 3 completed shifts: In: 2416.8 [I.V.:1560.1; IV Piggyback:856.7] Out: 4110 [Urine:4110] No intake/output data recorded.  SpO2: 98 % O2 Flow Rate (L/min): (S) 4 L/min FiO2 (%): 30 %  Estimated body mass index is 40.32 kg/m as calculated from the following:   Height as of this encounter: 5\' 5"  (1.651 m).   Weight as of this encounter: 109.9 kg.    Review of Systems:  Gen:  Denies  fever, sweats, chills weight loss  No SOB Other:  All other systems negative    Pressure Injury 08/13/20 Buttocks Right;Left B/L buttocks and scacrum, pink,red, and black (Active)  08/13/20 2000  Location: Buttocks  Location Orientation: Right;Left  Staging:   Wound Description (Comments): B/L buttocks and scacrum, pink,red, and black  Present on Admission: No   Physical Examination:   General Appearance: No distress  Neuro:without focal findings,  speech normal,  HEENT: PERRLA, EOM intact.   Pulmonary: normal breath sounds,  No wheezing.  CardiovascularNormal S1,S2.  No m/r/g.   Abdomen: Benign, Soft, non-tender. PSYCHIATRIC: poor insight   ALL OTHER ROS ARE NEGATIVE   MEDICATIONS: I have reviewed all medications and confirmed regimen as documented    Nutrition Status: Nutrition Problem: Inadequate oral intake Etiology: inability to  eat Signs/Symptoms: NPO status Interventions: Tube feeding, MVI, Prostat     ASSESSMENT AND PLAN SYNOPSIS Acute hypoxemic respiratory failure due to COVID-19 pneumonia / ARDScomplicated by renal failure s/p extubation and re-intubated for acute aspiration pneumonia S/p extubation resp failure resolving oxygen as needed Aspiration precautions  Morbid obesity, possible OSA.   Will certainly impact respiratory mechanics  AFIB WITH RVR Follow up cards recs amio and anticoaguation  DIET-->SPEECH THERAPY Constipation protocol as indicated   ELECTROLYTES -follow labs as needed -replace as needed -pharmacy consultation and following   DVT/GI PRX ordered and assessed TRANSFUSIONS AS NEEDED MONITOR FSBS I Assessed the need for Labs I Assessed the need for Foley I Assessed the need for Central Venous Line Family Discussion when available I Assessed the need for Mobilization I made an Assessment of medications to be adjusted accordingly Safety Risk assessment completed  CASE DISCUSSED IN MULTIDISCIPLINARY ROUNDS WITH ICU TEAM     TRANSFER TO [facility] 10/14  PCCM to sign off at this time  Please call if any questions     Corrin Parker, M.D.  [facility]  Medical Director [place] Director [place] Department
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
## 2020-08-17 NOTE — Progress Notes (Signed)
[place] for Electrolyte Monitoring and Replacement   Recent Labs: Potassium (mmol/L)  Date Value  08/17/2020 3.2 (L)   Magnesium (mg/dL)  Date Value  08/17/2020 1.9   Calcium (mg/dL)  Date Value  08/17/2020 9.2   Calcium, Total (PTH) (mg/dL)  Date Value  07/24/2020 10.1   Albumin (g/dL)  Date Value  08/17/2020 2.7 (L)  10/22/2019 4.2   Phosphorus (mg/dL)  Date Value  08/17/2020 2.1 (L)   Sodium (mmol/L)  Date Value  08/17/2020 137  10/22/2019 139    Assessment: 64 year old female with PMHx of HTN, HLD who was admitted with COVID-19 PNA, also with A-fib with RVR and severe intertrigo.  Patient extubated 10/12. Remains NPO pending speech eval which patient has refused.  Goal of Therapy:  Potassium 4.0 - 5.1 mmol/L Magnesium 2.0 - 2.4 mg/dL All Other Electrolytes WNL  Plan:   Potassium improved from previous but remains low. Patient received 6 runs of potassium with approximately one run given after lab draw.  Agreeable to working with speech therapy today, pending eval. Currently NPO.  Potassium phos 10 mmol IV x 1 ordered  Follow up electrolytes with morning labs  Tawnya Crook ,PharmD Clinical Pharmacist 08/17/2020 10:49 AM

## 2020-08-18 ENCOUNTER — Inpatient Hospital Stay: Payer: Medicare Other

## 2020-08-18 DIAGNOSIS — N179 Acute kidney failure, unspecified: Secondary | ICD-10-CM | POA: Diagnosis not present

## 2020-08-18 DIAGNOSIS — I4891 Unspecified atrial fibrillation: Secondary | ICD-10-CM | POA: Diagnosis not present

## 2020-08-18 DIAGNOSIS — U071 COVID-19: Secondary | ICD-10-CM | POA: Diagnosis not present

## 2020-08-18 DIAGNOSIS — A419 Sepsis, unspecified organism: Secondary | ICD-10-CM | POA: Diagnosis not present

## 2020-08-18 LAB — COMPREHENSIVE METABOLIC PANEL
ALT: 29 U/L (ref 0–44)
AST: 28 U/L (ref 15–41)
Albumin: 3 g/dL — ABNORMAL LOW (ref 3.5–5.0)
Alkaline Phosphatase: 69 U/L (ref 38–126)
Anion gap: 17 — ABNORMAL HIGH (ref 5–15)
BUN: 8 mg/dL (ref 8–23)
CO2: 28 mmol/L (ref 22–32)
Calcium: 9.6 mg/dL (ref 8.9–10.3)
Chloride: 93 mmol/L — ABNORMAL LOW (ref 98–111)
Creatinine, Ser: 0.91 mg/dL (ref 0.44–1.00)
GFR, Estimated: >60 mL/min (ref >60)
Glucose, Bld: 131 mg/dL — ABNORMAL HIGH (ref 70–99)
Potassium: 2.7 mmol/L — CL (ref 3.5–5.1)
Sodium: 138 mmol/L (ref 135–145)
Total Bilirubin: 1.4 mg/dL — ABNORMAL HIGH (ref 0.3–1.2)
Total Protein: 7.1 g/dL (ref 6.5–8.1)

## 2020-08-18 LAB — HEPARIN LEVEL (UNFRACTIONATED): Heparin Unfractionated: 0.98 IU/mL — ABNORMAL HIGH (ref 0.30–0.70)

## 2020-08-18 LAB — CBC
HCT: 36.6 % (ref 36.0–46.0)
Hemoglobin: 11.8 g/dL — ABNORMAL LOW (ref 12.0–15.0)
MCH: 27.3 pg (ref 26.0–34.0)
MCHC: 32.2 g/dL (ref 30.0–36.0)
MCV: 84.5 fL (ref 80.0–100.0)
Platelets: 138 10*3/uL — ABNORMAL LOW (ref 150–400)
RBC: 4.33 MIL/uL (ref 3.87–5.11)
RDW: 15.9 % — ABNORMAL HIGH (ref 11.5–15.5)
WBC: 10.1 10*3/uL (ref 4.0–10.5)
nRBC: 0.3 % — ABNORMAL HIGH (ref 0.0–0.2)

## 2020-08-18 LAB — POTASSIUM: Potassium: 3.2 mmol/L — ABNORMAL LOW (ref 3.5–5.1)

## 2020-08-18 LAB — GLUCOSE, CAPILLARY
Glucose-Capillary: 115 mg/dL — ABNORMAL HIGH (ref 70–99)
Glucose-Capillary: 123 mg/dL — ABNORMAL HIGH (ref 70–99)
Glucose-Capillary: 136 mg/dL — ABNORMAL HIGH (ref 70–99)
Glucose-Capillary: 83 mg/dL (ref 70–99)
Glucose-Capillary: 90 mg/dL (ref 70–99)
Glucose-Capillary: 92 mg/dL (ref 70–99)
Glucose-Capillary: 99 mg/dL (ref 70–99)

## 2020-08-18 LAB — DIGOXIN LEVEL: Digoxin Level: 1 ng/mL (ref 1.0–2.0)

## 2020-08-18 LAB — PHOSPHORUS: Phosphorus: 3.3 mg/dL (ref 2.5–4.6)

## 2020-08-18 LAB — MAGNESIUM: Magnesium: 1.5 mg/dL — ABNORMAL LOW (ref 1.7–2.4)

## 2020-08-18 LAB — APTT: aPTT: 73 seconds — ABNORMAL HIGH (ref 24–36)

## 2020-08-18 MED ORDER — COLLAGENASE 250 UNIT/GM EX OINT
TOPICAL_OINTMENT | Freq: Every day | CUTANEOUS | Status: DC
  Filled 2020-08-18 (×2): qty 30

## 2020-08-18 MED ORDER — SODIUM CHLORIDE 0.9 % IV SOLN
3.0000 g | Freq: Four times a day (QID) | INTRAVENOUS | Status: AC
  Administered 2020-08-18 – 2020-08-22 (×20): 3 g via INTRAVENOUS
  Filled 2020-08-18 (×3): qty 8
  Filled 2020-08-18: qty 3
  Filled 2020-08-18 (×3): qty 8
  Filled 2020-08-18: qty 3
  Filled 2020-08-18: qty 8
  Filled 2020-08-18: qty 3
  Filled 2020-08-18 (×2): qty 8
  Filled 2020-08-18: qty 3
  Filled 2020-08-18 (×2): qty 8
  Filled 2020-08-18 (×2): qty 3
  Filled 2020-08-18 (×4): qty 8
  Filled 2020-08-18: qty 3
  Filled 2020-08-18: qty 8

## 2020-08-18 MED ORDER — AMIODARONE HCL IN DEXTROSE 360-4.14 MG/200ML-% IV SOLN
10.0000 mg/h | INTRAVENOUS | Status: DC
  Administered 2020-08-18 – 2020-08-22 (×10): 30 mg/h via INTRAVENOUS
  Administered 2020-08-23: 10 mg/h via INTRAVENOUS
  Administered 2020-08-23: 30 mg/h via INTRAVENOUS
  Filled 2020-08-18 (×12): qty 200

## 2020-08-18 MED ORDER — MORPHINE SULFATE (PF) 2 MG/ML IV SOLN
2.0000 mg | Freq: Once | INTRAVENOUS | Status: AC
  Administered 2020-08-18: 2 mg via INTRAVENOUS
  Filled 2020-08-18: qty 1

## 2020-08-18 MED ORDER — DIGOXIN 0.25 MG/ML IJ SOLN
0.2500 mg | Freq: Every day | INTRAMUSCULAR | Status: DC
  Administered 2020-08-18 – 2020-08-23 (×6): 0.25 mg via INTRAVENOUS
  Filled 2020-08-18 (×6): qty 2

## 2020-08-18 MED ORDER — FUROSEMIDE 10 MG/ML IJ SOLN
20.0000 mg | Freq: Two times a day (BID) | INTRAMUSCULAR | Status: DC
  Administered 2020-08-18 – 2020-08-21 (×7): 20 mg via INTRAVENOUS
  Filled 2020-08-18 (×7): qty 2

## 2020-08-18 MED ORDER — POTASSIUM CHLORIDE 10 MEQ/100ML IV SOLN
10.0000 meq | INTRAVENOUS | Status: AC
  Administered 2020-08-18 (×6): 10 meq via INTRAVENOUS
  Filled 2020-08-18 (×6): qty 100

## 2020-08-18 MED ORDER — POTASSIUM CHLORIDE 10 MEQ/100ML IV SOLN
10.0000 meq | INTRAVENOUS | Status: AC
  Administered 2020-08-18 – 2020-08-19 (×4): 10 meq via INTRAVENOUS
  Filled 2020-08-18 (×5): qty 100

## 2020-08-18 MED ORDER — AMIODARONE HCL IN DEXTROSE 360-4.14 MG/200ML-% IV SOLN
60.0000 mg/h | INTRAVENOUS | Status: AC
  Administered 2020-08-18: 60 mg/h via INTRAVENOUS
  Filled 2020-08-18: qty 200

## 2020-08-18 MED ORDER — MAGNESIUM SULFATE 2 GM/50ML IV SOLN
2.0000 g | Freq: Once | INTRAVENOUS | Status: AC
  Administered 2020-08-18: 2 g via INTRAVENOUS
  Filled 2020-08-18: qty 50

## 2020-08-18 NOTE — Progress Notes (Signed)
[place] for Heparin (switch from Eliquis) Indication: atrial fibrillation  Allergies  Allergen Reactions  . Codeine    Patient Measurements: Height: 5\' 5"  (165.1 cm) Weight: 109.9 kg (242 lb 4.6 oz) IBW/kg (Calculated) : 57 HEPARIN DW (KG): 73.7  Vital Signs: Temp: 97 F (36.1 C) (10/15 0000) Temp Source: Oral (10/15 0000) BP: 155/86 (10/15 0400) Pulse Rate: 94 (10/15 0445)  Labs: Recent Labs    08/16/20 0456 08/16/20 0616 08/16/20 0616 08/16/20 1347 08/16/20 2222 08/17/20 0509 08/18/20 0441  HGB  --  11.4*   < >  --   --  11.0* 11.8*  HCT  --  35.9*  --   --   --  33.9* 36.6  PLT  --  167  --   --   --  163 138*  APTT  --  48*  --    < > 87* 87* 73*  HEPARINUNFRC  --   --   --   --   --   --  0.98*  CREATININE 0.88  --   --   --   --  0.89  --    < > = values in this interval not displayed.    Estimated Creatinine Clearance: 77.8 mL/min (by C-G formula based on SCr of 0.89 mg/dL).   Medical History: Past Medical History:  Diagnosis Date  . Allergy   . Elevated transaminase level   . Hyperlipidemia   . Hypertension   . Obesity    Assessment: Switching from Eliquis to IV Heparin as pt is now NPO.  Will use aPTT to adjust Heparin infusion. Last Eliquis 5mg  dose was 10/12 at 0900  Heparin was scheduled to be discontinued ~18:00 this afternoon and patient was to resume apixaban, however patient was too lethargic to swallow so heparin was continued.   Goal of Therapy:  APTT 66-102 Monitor platelets by anticoagulation protocol: Yes   Plan:  10/13 2222 APTT 87 therapeutic x 1. Continue heparin drip at 1250 units/hr. Recheck APTT in am to confirm. CBC daily.  10/14 0509 aPTT 87, therapeutic x 2.  CBC stable.  Continue current rate and recheck aPTT and CBC in am.  10/15 0441 aPTT 73, therapeutic x 3.  HL 0.98 (not correlating).  H/H stable, PLTs are trending down 163 >> 138.  Will continue current rate and recheck  HL/aPTT/CBC in am  Ena Dawley, PharmD 08/18/2020,5:35 AM

## 2020-08-18 NOTE — Consult Note (Addendum)
[place] Nurse re-Consult Note: Reason for Consult: Consult was previously performed by the [facility] team on 10/7 for abd wounds.  Requested to reassess abd wound now also buttocks/sacrum wounds.  Pt has been critically ill with a prolonged length of stay, with multiple systemic factors which can impair healing.  Wound type: Left buttock with stage 3 pressure injury; 1.5X1.5X.2cm, 100% red and moist Sacrum with unstageqable pressure injury; 5X5cm, 100% tightly adhered yellow slough. Pressure Injury POA: No Dressing procedure/placement/frequency: Abd and bilat thigh skin folds with multiple full thickness wounds related to chronic moisture. All are red and moist, mod amt tan drainage, strong odor. Middle abd 25X1X.2cm Left thigh 15X1X.2cm Right thigh 8X1X.2cmRight outer abd 3X3X.2cm and 2X2X.2cm Plan: Topical treatment orders provided for bedside nurses to perform as folows to provide enzymatic debridement and promote healing: Apply Santyl to sacrum wound Q day, then cover with moist gauze and foam dressing.  (Change foam dressing Q 3 days or PRN soiling.) Apply pieces of Aquacel abd and bilat groin wounds Q M/W/F then cover with foam dressings. [place] team will reassess sacrum wound weekly to determine if a change in the plan of care is indicated at that time. Julien Girt MSN, RN, [place], [place], [place]

## 2020-08-18 NOTE — Progress Notes (Addendum)
[place] for Electrolyte Monitoring and Replacement   Recent Labs: Potassium (mmol/L)  Date Value  08/18/2020 2.7 (LL)   Magnesium (mg/dL)  Date Value  08/18/2020 1.5 (L)   Calcium (mg/dL)  Date Value  08/18/2020 9.6   Calcium, Total (PTH) (mg/dL)  Date Value  07/24/2020 10.1   Albumin (g/dL)  Date Value  08/18/2020 3.0 (L)  10/22/2019 4.2   Phosphorus (mg/dL)  Date Value  08/18/2020 3.3   Sodium (mmol/L)  Date Value  08/18/2020 138  10/22/2019 139    Assessment: 64 year old female with PMHx of HTN, HLD who was admitted with COVID-19 PNA, also with A-fib with RVR and severe intertrigo.  Patient extubated 10/12. Remains NPO pending speech eval which patient has refused.  Goal of Therapy:  Potassium 4.0 - 5.1 mmol/L Magnesium 2.0 - 2.4 mg/dL All Other Electrolytes WNL  Plan:   Patient continues to receive potassium and magnesium replacement daily likely s/t NPO status.  Potassium 10 mEq IV x 6 + mag 2 g IV x 1  MD started furosemide 20 mg IV BID. Will likely worsen hypokalemia and require increased replacement.  Replacement complicated by NPO status.  Recheck potassium this evening. Follow up all electrolytes with morning labs  Tawnya Crook ,PharmD Clinical Pharmacist 08/18/2020 12:17 PM

## 2020-08-18 NOTE — Progress Notes (Signed)
Pt noted to be more lethargic and mouth breathing.  Discussed with Dr. Mortimer Fries.  Dr. Mortimer Fries requests that ABG be drawn and place pt on BiPAP for questionable OSA.  Discussed with nursing and respiratory therapy.    Darel Hong, AGACNP-BC [place] [facility] Pager: [phone]

## 2020-08-18 NOTE — Progress Notes (Signed)
[place] for Electrolyte Monitoring and Replacement   Recent Labs: Potassium (mmol/L)  Date Value  08/18/2020 3.2 (L)   Magnesium (mg/dL)  Date Value  08/18/2020 1.5 (L)   Calcium (mg/dL)  Date Value  08/18/2020 9.6   Calcium, Total (PTH) (mg/dL)  Date Value  07/24/2020 10.1   Albumin (g/dL)  Date Value  08/18/2020 3.0 (L)  10/22/2019 4.2   Phosphorus (mg/dL)  Date Value  08/18/2020 3.3   Sodium (mmol/L)  Date Value  08/18/2020 138  10/22/2019 139    Assessment: 64 year old female with PMHx of HTN, HLD who was admitted with COVID-19 PNA, also with A-fib with RVR and severe intertrigo. Patient extubated 10/12. Remains NPO pending speech eval which patient has refused.  Patient continues to receive potassium and magnesium replacement daily likely s/t NPO status. MD started furosemide 20 mg IV BID. Will likely worsen hypokalemia and require increased replacement.  K 3.2 s/p 10 mEq IV x 6  Goal of Therapy:  Potassium 4.0 - 5.1 mmol/L Magnesium 2.0 - 2.4 mg/dL All Other Electrolytes WNL  Plan:   K 3.2 - Will order KCl 68mEq IV x4  Recheck potassium with AM labs.   Follow up all electrolytes with morning labs  Sherilyn Banker, PharmD Pharmacy Resident  08/18/2020 7:40 PM

## 2020-08-18 NOTE — Progress Notes (Signed)
Was notified by nursing that pt had episode of coughing with copious amounts of secretions while pt on BiPAP,  Requiring oral suctioning.  Event was followed by hypoxia and increasing FiO2 requirements, concerning for aspiration event.  Pt is lethargic, but will arouse and nod to questions, but unable to adequately assess orientation.  Will remove BiPAP and transition to [place].  Will obtain CXR and empirically start Unasyn for presumed aspiration.    Called and updated pt's son Courtney Anderson regarding event, and that if pt does not tolerate HHFNC, then will have to consider intubation vs. Comfort care.  Pt is currently listed as DNR, but Mr. Javier Anderson would like to change her code status back to Full Code and pursue intubation if needed.     Darel Hong, AGACNP-BC [place] [facility] Pager: [phone]

## 2020-08-18 NOTE — Progress Notes (Addendum)
PROGRESS NOTE    Courtney Anderson  ATF:[id number] DOB: November 04, 1956 DOA: 07/24/2020 PCP: Courtney Maple, MD    Brief Narrative:  Courtney Anderson an 64 y.o.female.  YHC:WCBJSEG is a 64 year old female with history noted below, who was admitted to [facility] on 2020-07-24 brought in via EMS as a "code sepsis". Patient subsequently tested positive for COVID-19. She presented with oxygen saturations of 69% on room air and had to be placed on nonrebreather mask to achieve oxygen saturations in the 90s. She was subsequently admitted to the Covid unit for management of COVID-19 pneumonia. She was noted to have acute kidney injury she was treated with remdesivir, zinc, vitamin C and antitussives. She was placed empirically on Rocephin and azithromycin. She continued to have increased FiO2 requirements. She was refusing to self prone. She had atrial fibrillation and was seen by cardiologyto assist in management.   CC: f/u respiratory failure  07/31/20- patient is on 90% FiO2, will modify meds as patient is on metoprolol and levophed as well as cardizem. Will d/c propofol to improve SBP and remove arythmogenic vasopressor if able. Plan to have CVP trending and diuresis today. Goals of care discussion today - patient made DNR per sister and son of patient. I had 2 separate phone meetings with son and separate conversation with sister today to explain patient is worse with shock and renal failure requiring multiple pressors.   08/01/20- patient remains critically ill. FiO2 has been weaned down to 80% today.  08/03/20- patient remains critically ill shes being proned, weaning FiO2 as able.  08/04/20-patient with no interval changes continue with weaning and proning protocol. Continue weaning FiO2 and PEEP as to optimize for SBT 08/06/20- patient is weaned down 40%FiO2 today, will attempt awakening trial with SBT, I spoke to son [name] today, he appreciates update and does not want Elink video view  at this time.  08/07/20- patient is requiring 50%FiO2 today 08/08/20- Continuing to wean vent. FiO2 is 28%, will trial SBT today 08/09/20-tachypneic and hypertensive with SBT. Asynchronous.FiO2 back up to 50% 10/7 severeresp failure, attempt SAT/SBT 10/7 extubated to biPAP 10/8 Tachycardic , more confused,patient re-intubated ETT 8.0 10/9 remains on vent 10/11 severe resp failure on vent 10/12 failed weaning trial 10/12 successfully extubated 10/14 TRANSFER TO TRH 10/15 somnolent, sleepy , not interactive speech and swallow passed, but had aspiration event. At risk for aspiration .    Consultants:   Pccm, cardiology  Procedures:  Antimicrobials:      Subjective: Sleepy, somnolent, not cooperative with exam. gurggling upper airway.   Objective: Vitals:   08/18/20 2000 08/18/20 2015 08/18/20 2017 08/18/20 2054  BP: (!) 127/94     Pulse: 78 73    Resp: (!) 21 (!) 22    Temp: (!) 97.4 F (36.3 C)     TempSrc: Oral     SpO2: 98% 99% 100% 100%  Weight:      Height:        Intake/Output Summary (Last 24 hours) at 08/18/2020 2108 Last data filed at 08/18/2020 2000 Gross per 24 hour  Intake 1835.27 ml  Output 1465 ml  Net 370.27 ml   Filed Weights   08/05/20 0500 08/06/20 0412 08/12/20 0530  Weight: 108.4 kg 108.4 kg 109.9 kg    Examination: Somnolent, sleepy, uncooperative with exam +rhonchi b/l Reg-irreg, s1/s2 no m/r/r Soft few open sores, nt/nd +bs + edema b/l Neuro unable to assess    Data Reviewed: I have personally reviewed following labs and imaging  studies  CBC: Recent Labs  Lab 08/12/20 0523 08/12/20 0523 08/13/20 0405 08/13/20 0405 08/14/20 0413 08/15/20 0515 08/16/20 0616 08/17/20 0509 08/18/20 0441  WBC 7.4   < > 6.4   < > 7.1 7.2 6.6 6.4 10.1  NEUTROABS 5.8  --  4.7  --  5.8 5.1 4.6  --   --   HGB 10.0*   < > 10.5*   < > 10.8* 10.9* 11.4* 11.0* 11.8*  HCT 32.2*   < > 34.3*   < > 35.8* 34.9* 35.9* 33.9* 36.6  MCV 91.5   < > 90.7    < > 91.1 89.5 87.3 86.7 84.5  PLT 153   < > 144*   < > 158 155 167 163 138*   < > = values in this interval not displayed.   Basic Metabolic Panel: Recent Labs  Lab 08/14/20 0413 08/14/20 0413 08/15/20 0515 08/15/20 0515 08/16/20 0456 08/16/20 2222 08/17/20 0509 08/18/20 0441 08/18/20 1829  NA 146*  --  141  --  139  --  137 138  --   K 3.5   < > 2.9*   < > 2.8* 2.8* 3.2* 2.7* 3.2*  CL 106  --  100  --  97*  --  94* 93*  --   CO2 30  --  29  --  31  --  30 28  --   GLUCOSE 113*  --  100*  --  88  --  90 131*  --   BUN 17  --  12  --  10  --  8 8  --   CREATININE 0.92  --  0.97  --  0.88  --  0.89 0.91  --   CALCIUM 9.4  --  9.5  --  9.3  --  9.2 9.6  --   MG 1.8  --  1.9  --  1.8  --  1.9 1.5*  --   PHOS 2.8  --  2.5  --  2.1*  --  2.1* 3.3  --    < > = values in this interval not displayed.   GFR: Estimated Creatinine Clearance: 76.1 mL/min (by C-G formula based on SCr of 0.91 mg/dL). Liver Function Tests: Recent Labs  Lab 08/14/20 0413 08/15/20 0515 08/16/20 0456 08/17/20 0509 08/18/20 0441  AST 23 27 26 28 28   ALT 39 35 29 30 29   ALKPHOS 55 60 58 66 69  BILITOT 0.7 1.1 1.1 1.4* 1.4*  PROT 6.0* 6.3* 6.0* 6.9 7.1  ALBUMIN 2.6* 2.7* 2.5* 2.7* 3.0*   No results for input(s): LIPASE, AMYLASE in the last 168 hours. No results for input(s): AMMONIA in the last 168 hours. Coagulation Profile: No results for input(s): INR, PROTIME in the last 168 hours. Cardiac Enzymes: No results for input(s): CKTOTAL, CKMB, CKMBINDEX, TROPONINI in the last 168 hours. BNP (last 3 results) No results for input(s): PROBNP in the last 8760 hours. HbA1C: No results for input(s): HGBA1C in the last 72 hours. CBG: Recent Labs  Lab 08/18/20 0444 08/18/20 0730 08/18/20 1144 08/18/20 1553 08/18/20 1922  GLUCAP 136* 123* 99 92 83   Lipid Profile: No results for input(s): CHOL, HDL, LDLCALC, TRIG, CHOLHDL, LDLDIRECT in the last 72 hours. Thyroid Function Tests: No results for  input(s): TSH, T4TOTAL, FREET4, T3FREE, THYROIDAB in the last 72 hours. Anemia Panel: No results for input(s): VITAMINB12, FOLATE, FERRITIN, TIBC, IRON, RETICCTPCT in the last 72 hours. Sepsis Labs: No results for  input(s): PROCALCITON, LATICACIDVEN in the last 168 hours.  No results found for this or any previous visit (from the past 240 hour(s)).       Radiology Studies: DG Chest Port 1 View  Result Date: 08/18/2020 CLINICAL DATA:  Aspiration EXAM: PORTABLE CHEST 1 VIEW COMPARISON:  08/11/2020 FINDINGS: Endotracheal tube is no longer present. Unchanged position of left IJ central venous catheter. Bilateral airspace opacities are unchanged. Small pleural effusions. IMPRESSION: Unchanged bilateral airspace opacities. Electronically Signed   By: Ulyses Jarred M.D.   On: 08/18/2020 00:53        Scheduled Meds: . aspirin  81 mg Per Tube Daily  . Chlorhexidine Gluconate Cloth  6 each Topical Daily  . cholecalciferol  2,000 Units Per Tube Daily  . collagenase   Topical Daily  . digoxin  0.25 mg Intravenous Daily  . furosemide  20 mg Intravenous BID  . insulin aspart  0-9 Units Subcutaneous Q4H  . mouth rinse  15 mL Mouth Rinse q12n4p  . multivitamin with minerals  1 tablet Per Tube Daily  . nystatin   Topical TID  . pneumococcal 23 valent vaccine  0.5 mL Intramuscular Tomorrow-1000   Continuous Infusions: . sodium chloride Stopped (08/12/20 1532)  . amiodarone 30 mg/hr (08/18/20 2000)  . ampicillin-sulbactam (UNASYN) IV Stopped (08/18/20 1905)  . famotidine (PEPCID) IV Stopped (08/18/20 1146)  . heparin 1,350 Units/hr (08/17/20 2250)  . potassium chloride 10 mEq (08/18/20 2051)    Assessment & Plan:   Principal Problem:   Sepsis ([place]) Active Problems:   Hypertension   Morbid obesity ([place])   Hypercalcemia   Hyperthyroidism   AKI (acute kidney injury) ([place])   COVID-19 virus infection   Atrial fibrillation with RVR ([place])   Hypokalemia   Acute hypoxemic respiratory  failure due to COVID-19 pneumonia / ARDScomplicated by renal failure-  s/p extubation and re-intubated for acute aspiration pneumonia Resp . Failure resolving, now TRH will take over. 10/15- pt had aspiration event, somnolent this am. At high risk for aspiration. May need PEG palcment. pccm following Continue with unasyn    Morbid obesity, possible OSA.  -complicates medical issues, and respiratory status, o2 drops Will need sleep study eventually as outpt   AFIB WITH RVR Cardiology following On heparin gtt Continue with amiodarone gtt, cant transition to po On iv digoxin   Hypophosphatemia- Replaced and stable phosph 3.3  Hypokalemia-K is 2.7, will replace.    DVT prophylaxis: heparin  Code Status: DNR Family Communication: None at bedside   Status is: Inpatient  Remains inpatient appropriate because:IV treatments appropriate due to intensity of illness or inability to take PO   Dispo: The patient is from: Home              Anticipated d/c is to: SNF              Anticipated d/c date is: 2 days              Patient currently is not medically stable to d/c. transitioning to po medications. Needs pt/ot assessment.            LOS: 25 days   Time spent: 35 min with >50% on coc    Nolberto Hanlon, MD [facility] Pager [phone]  If 7PM-7AM, please contact night-coverage [email or web address] Password [email or web address] 08/18/2020, 9:08 PM

## 2020-08-18 NOTE — Progress Notes (Signed)
Assisted tele visit to patient with son.  Ashlinn Hemrick Anderson, RN   

## 2020-08-18 NOTE — Progress Notes (Signed)
Nutrition Follow-up  DOCUMENTATION CODES:   Obesity unspecified  INTERVENTION:   If tube feeds initiated, recommend:  Osmolite 1.5 _0 /hr- Initiate at 74m/hr and advance by 174mhr q 8 hours until goal rate is reached.   Pro-Source [id number] via tube, provides 40kcal and 11g of protein per serving   Free water flushes 29m54m hours to maintain tube patency   Regimen provides 2420kcal/day, 120g/day protein and 1369ml45m free water    Recommend Juven Fruit Punch BID via tube, each serving provides 95kcal and 2.5g of protein (amino acids glutamine and arginine)  NUTRITION DIAGNOSIS:   Inadequate oral intake related to inability to eat as evidenced by NPO status. Ongoing.  GOAL:   Patient will meet greater than or equal to 90% of their needs -previously met with TF regimen.  MONITOR:   Diet advancement, Labs, Weight trends, Skin, I & O's  ASSESSMENT:   64 ye18 old female with PMHx of HTN, HLD who was admitted with COVID-19 PNA, also with A-fib with RVR and severe intertrigo.   Pt see by SLP 10/14 and placed on a dysphagia 1/thin liquid diet. Pt is continuing to aspirate; pt was seen by SLP today and made NPO. Plan is for NGT vs PEG vs comfort. MD to speak with family today. Per chart, pt has been fairly weight stable; last documented weight is from 10/9. RD will request weekly weights.   Medications reviewed and include: vitamin C, aspirin, vitamin D, insulin, MVI, zinc, pepcid, unasyn, KCl  Labs reviewed: K 2.8(L), P 3.3 wnl, Mg 1.5(L) cbgs- 136, 123 x 24 hrs  Diet Order:   Diet Order            Diet NPO time specified  Diet effective now                EDUCATION NEEDS:   No education needs have been identified at this time  Skin:  Skin Assessment: Reviewed RN Assessment (Left buttock with stage 3 pressure injury; 1.5X1.5X.2cm, Sacrum with unstageqable pressure injury; 5X5cm, Middle abd 25X1X.2cm, Left thigh 15X1X.2cm, Right thigh 8X1X.2cmRight outer abd  3X3X.2cm and 2X2X.2cm)  Last BM:  10/15- type 6  Height:   Ht Readings from Last 1 Encounters:  08/05/20 _1  (1.651 m)   Weight:   Wt Readings from Last 1 Encounters:  08/12/20 109.9 kg   Ideal Body Weight:  56.8 kg  BMI:  Body mass index is 40.32 kg/m.  Estimated Nutritional Needs:   Kcal:  2200-2500kcal/day  Protein:  110-125g/day  Fluid:  >/= 2 L/day  CaseyKoleen DistanceRD, LDN Please refer to AMIONLittle River HealthcareRD and/or RD on-call/weekend/after hours pager

## 2020-08-18 NOTE — Progress Notes (Signed)
CRITICAL CARE PROGRESS NOTE    Name: BAYAN HEDSTROM MRN: [id number] DOB: [birth year]     LOS: 16   SUBJECTIVE FINDINGS & SIGNIFICANT EVENTS    Patient description:  Reason for Consult: Severe respiratory distress and patient with COVID-19 pneumonia. Referring Physician: Florina Ou, MD  [name] is an 64 y.o. female.  HPI: Patient is a 64 year old female with history noted below, who was admitted to [facility] on 2020-07-24 brought in via EMS as a "code sepsis".  Patient subsequently tested positive for COVID-19.  She presented with oxygen saturations of 69% on room air and had to be placed on nonrebreather mask to achieve oxygen saturations in the 90s.  She was subsequently admitted to the Covid unit for management of COVID-19 pneumonia.  She was noted to have acute kidney injury she was treated with remdesivir, zinc, vitamin C and antitussives.  She was placed empirically on Rocephin and azithromycin.  She continued to have increased FiO2 requirements.  She was refusing to self prone.  She had atrial fibrillation and was seen by cardiology to assist in management.   07/31/20- patient is on 90% FiO2, will modify meds as patient is on metoprolol and levophed as well as cardizem.  Will d/c propofol to improve SBP and remove arythmogenic vasopressor if able.  Plan to have CVP trending and diuresis today.   Goals of care discussion today - patient made DNR per sister and son of patient. I had 2 separate phone meetings with son and separate conversation with sister today to explain patient is worse with shock and renal failure requiring multiple pressors.   08/01/20- patient remains critically ill. FiO2 has been weaned down to 80% today.  08/03/20- patient remains critically ill shes being proned, weaning FiO2 as able.    08/04/20- patient with no interval changes continue with weaning and proning protocol.  Continue weaning FiO2 and PEEP as to optimize for SBT 08/06/20- patient is weaned down 40%FiO2 today, will attempt awakening trial with SBT, I spoke to son [name] today, he appreciates update and does not want Elink video view at this time.  08/07/20- patient is requiring 50%FiO2 today 08/08/20- Continuing to wean vent. FiO2 is 28%, will trial SBT today 08/09/20-tachypneic and hypertensive with SBT. Asynchronous.FiO2 back up to 50% 10/7 severeresp failure, attempt SAT/SBT 10/7 extubated to biPAP 10/8 Tachycardic , more confused,patient re-intubated ETT 8.0 10/9 remains on vent 10/11 severe resp failure on vent 10/12 failed weaning trial 10/12 successfully extubated 10/14 TRANSFER TO TRH 10/15- passed speech and swallow but then had aspiration event. Despite this we were able towean her HFNC and plan to transition to nasal canula.  She is high risk for aspiration still.  Wounds around torso - moisture associated and torso/abdomen/inguinal/sacral/intertriginous.  Will reconsult wound care and will consider PICC for TPN. Will discuss with family PEG/OG/TPN. I spoke with son [name] he is very greatful and thankful.    Lines/tubes : Airway 7.5 mm (Active)  Secured at (cm) 23 cm 07/31/20 0806  Measured From Lips 07/31/20 [place] 07/31/20 0806  Secured By [employer] 07/31/20 0806  Tube Holder Repositioned Yes 07/31/20 0806  Cuff Pressure (cm H2O) 26 cm H2O 07/31/20 0806  Site Condition Dry 07/31/20 0806     CVC Triple Lumen 07/30/20 Left Internal jugular (Active)  Indication for Insertion or Continuance of Line Vasoactive infusions 07/30/20 2010  Site Assessment Clean;Dry;Intact 07/30/20 2010  Proximal Lumen Status Infusing 07/30/20 2010  Medial Lumen Status Infusing 07/30/20 2010  Distal Lumen Status Infusing 07/30/20 2010  Dressing Type Transparent;Occlusive  07/30/20 2010  Dressing Status Clean;Dry;Intact 07/30/20 2010  Line Care Connections checked and tightened 07/30/20 2010  Dressing Change Due 08/06/20 07/30/20 2010     NG/OG Tube [facility] mouth Xray Documented cm marking at nare/ corner of mouth 75 cm (Active)  Cm Marking at Nare/Corner of Mouth (if applicable) 75 cm 29/93/71 2010  Site Assessment Clean;Dry;Intact 07/30/20 2010  Ongoing Placement Verification No change in cm markings or external length of tube from initial placement;No acute changes, not attributed to clinical condition;No change in respiratory status;Xray 07/30/20 2010  Status Suction-low intermittent 07/30/20 2010  Amount of suction 80 mmHg 07/30/20 2010  Drainage Appearance [employer] Shark;Coffee ground 07/30/20 2010     Urethral Catheter Olen Pel Double-lumen;Latex 16 Fr. (Active)  Indication for Insertion or Continuance of Catheter Unstable critically ill patients first 24-48 hours (See Criteria) 07/31/20 0752  Site Assessment Clean;Intact 07/30/20 2000  Catheter Maintenance Bag below level of bladder;Catheter secured;Drainage bag/tubing not touching floor;Insertion date on drainage bag;No dependent loops;Seal intact 07/30/20 2000  Collection Container Standard drainage bag 07/30/20 2000  Securement Method Securing device (Describe) 07/30/20 2000  Urinary Catheter Interventions (if applicable) Unclamped 69/67/89 2000  Output (mL) 75 mL 07/31/20 0600    Microbiology/Sepsis markers: Results for orders placed or performed during the hospital encounter of 07/24/20  Urine culture     Status: Abnormal   Collection Time: 07/24/20  9:12 AM   Specimen: Urine, Random  Result Value Ref Range Status   Specimen Description   Final    URINE, RANDOM Performed at [facility], [address], [place], [place] [address]    Special Requests   Final    NONE Performed at [facility], [address], [place], [place] [address]    Culture (A)  Final     <10,000 COLONIES/mL >=100,000 COLONIES/mL Performed at [facility], [place] [address] [place], [place] [address]    Report Status 07/25/2020 FINAL  Final  Blood culture (routine single)     Status: None   Collection Time: 07/24/20  9:14 AM   Specimen: BLOOD LEFT ARM  Result Value Ref Range Status   Specimen Description BLOOD LEFT ARM  Final   Special Requests   Final    BOTTLES DRAWN AEROBIC AND ANAEROBIC Blood Culture adequate volume   Culture   Final    NO GROWTH 5 DAYS Performed at [facility], [address] [place], [place] [address]    Report Status 07/29/2020 FINAL  Final  SARS Coronavirus 2 by RT PCR (hospital order, performed in [place] hospital lab) Nasopharyngeal Nasopharyngeal Swab     Status: Abnormal   Collection Time: 07/24/20  9:16 AM   Specimen: Nasopharyngeal Swab  Result Value Ref Range Status   SARS Coronavirus 2 POSITIVE (A) NEGATIVE Final    Comment: RESULT CALLED TO, READ BACK BY AND VERIFIED WITH:  SAMANTHA HAMILTON AT 3536 07/24/20 SDR (NOTE) SARS-CoV-2 target nucleic acids are DETECTED  SARS-CoV-2 RNA is generally detectable in upper respiratory specimens  during the acute phase of infection.  Positive results are indicative  of the presence of the identified virus, but do not rule out bacterial infection or co-infection with other pathogens not detected by the test.  Clinical correlation with patient history and  other diagnostic information is necessary to determine patient infection status.  The expected result is negative.  Fact Sheet  for Patients:   StrictlyIdeas.no   Fact Sheet for Healthcare Providers:   BankingDealers.co.za    This test is not yet approved or cleared by the [place] FDA and  has been authorized for detection and/or diagnosis of SARS-CoV-2 by FDA under an Emergency Use Authorization (EUA).  This EUA will remain in effect (meaning thi s test can be used)  for the duration of  the COVID-19 declaration under Section 564(b)(1) of the Act, 21 U.S.C. section 360-bbb-3(b)(1), unless the authorization is terminated or revoked sooner.  Performed at [facility], [address] [place], [place] [address]   MRSA PCR Screening     Status: None   Collection Time: 07/30/20 11:08 AM   Specimen: Nasopharyngeal  Result Value Ref Range Status   MRSA by PCR NEGATIVE NEGATIVE Final    Comment:        The GeneXpert MRSA Assay (FDA approved for NASAL specimens only), is one component of a comprehensive MRSA colonization surveillance program. It is not intended to diagnose MRSA infection nor to guide or monitor treatment for MRSA infections. Performed at [facility], [address] [place], [place] [address]   Culture, respiratory (non-expectorated)     Status: None   Collection Time: 08/01/20 10:46 AM   Specimen: Tracheal Aspirate; Respiratory  Result Value Ref Range Status   Specimen Description   Final    TRACHEAL ASPIRATE Performed at [facility], [address] [place], [place] [address]    Special Requests   Final    NONE Performed at [facility], [place], [place] [address]    Gram Stain NO WBC SEEN NO ORGANISMS SEEN   Final   Culture   Final    RARE Normal respiratory flora-no Staph aureus or Pseudomonas seen Performed at [facility] [address] [place], [place] [address]    Report Status 08/03/2020 FINAL  Final    Anti-infectives:  Anti-infectives (From admission, onward)   Start     Dose/Rate Route Frequency Ordered Stop   08/18/20 0100  Ampicillin-Sulbactam (UNASYN) 3 g in sodium chloride 0.9 % 100 mL IVPB        3 g 200 mL/hr over 30 Minutes Intravenous Every 6 hours 08/18/20 0004     08/10/20 1600  fluconazole (DIFLUCAN) IVPB 200 mg        200 mg 100 mL/hr over 60 Minutes Intravenous Every 24 hours 08/10/20 1304 08/18/20 1200   08/01/20 1200   ceFEPIme (MAXIPIME) 2 g in sodium chloride 0.9 % 100 mL IVPB  Status:  Discontinued        2 g 200 mL/hr over 30 Minutes Intravenous Every 12 hours 08/01/20 1026 08/03/20 1014   07/25/20 1000  azithromycin (ZITHROMAX) 500 mg in sodium chloride 0.9 % 250 mL IVPB  Status:  Discontinued        500 mg 250 mL/hr over 60 Minutes Intravenous Every 24 hours 07/24/20 1108 07/24/20 1317   07/25/20 1000  remdesivir 100 mg in sodium chloride 0.9 % 100 mL IVPB       "Followed by" Linked Group Details   100 mg 200 mL/hr over 30 Minutes Intravenous Daily 07/24/20 1112 07/28/20 1544   07/24/20 1300  fluconazole (DIFLUCAN) tablet 100 mg  Status:  Discontinued        100 mg Oral Daily 07/24/20 1251 07/25/20 1522   07/24/20 1200  remdesivir 200 mg in sodium chloride 0.9% 250 mL IVPB       "  Followed by" Linked Group Details   200 mg 580 mL/hr over 30 Minutes Intravenous Once 07/24/20 1112 07/24/20 1424   07/24/20 1115  cefTRIAXone (ROCEPHIN) 2 g in sodium chloride 0.9 % 100 mL IVPB  Status:  Discontinued        2 g 200 mL/hr over 30 Minutes Intravenous Every 24 hours 07/24/20 1108 07/24/20 1113   07/24/20 1000  cefTRIAXone (ROCEPHIN) 2 g in sodium chloride 0.9 % 100 mL IVPB  Status:  Discontinued        2 g 200 mL/hr over 30 Minutes Intravenous Every 24 hours 07/24/20 0947 07/28/20 1438   07/24/20 1000  azithromycin (ZITHROMAX) 500 mg in sodium chloride 0.9 % 250 mL IVPB  Status:  Discontinued        500 mg 250 mL/hr over 60 Minutes Intravenous Every 24 hours 07/24/20 0947 07/28/20 1438       Consults: Treatment Team:  Yolonda Kida, MD Dionisio David, MD Isaias Cowman, MD Teodoro Spray, MD     PAST MEDICAL HISTORY   Past Medical History:  Diagnosis Date  . Allergy   . Elevated transaminase level   . Hyperlipidemia   . Hypertension   . Obesity      SURGICAL HISTORY   Past Surgical History:  Procedure Laterality Date  . BREAST SURGERY    . ECTOPIC PREGNANCY SURGERY        FAMILY HISTORY   Family History  Problem Relation Age of Onset  . Hypertension Father   . Stroke Father   . Hypertension Mother   . Arthritis Mother   . Aneurysm Sister   . Hypertension Sister   . Hypertension Brother   . Hypertension Sister   . Hypertension Brother      SOCIAL HISTORY   Social History   Tobacco Use  . Smoking status: Former Research scientist (life sciences)  . Smokeless tobacco: Never Used  Substance Use Topics  . Alcohol use: Yes    Alcohol/week: 0.0 standard drinks    Comment: on occasion  . Drug use: No     MEDICATIONS   Current Medication:  Current Facility-Administered Medications:  .  0.9 %  sodium chloride infusion, , Intravenous, PRN, Flora Lipps, MD, Stopped at 08/12/20 1532 .  acetaminophen (TYLENOL) tablet 650 mg, 650 mg, Oral, Q6H PRN, 650 mg at 08/09/20 2127 **OR** acetaminophen (TYLENOL) suppository 650 mg, 650 mg, Rectal, Q6H PRN, Agbata, Tochukwu, MD .  amiodarone (NEXTERONE PREMIX) 360-4.14 MG/200ML-% (1.8 mg/mL) IV infusion, 30 mg/hr, Intravenous, Continuous, Darel Hong D, NP, Last Rate: 16.67 mL/hr at 08/18/20 0625, 30 mg/hr at 08/18/20 0625 .  Ampicillin-Sulbactam (UNASYN) 3 g in sodium chloride 0.9 % 100 mL IVPB, 3 g, Intravenous, Q6H, Hall, Scott A, RPH, Last Rate: 200 mL/hr at 08/18/20 1315, 3 g at 08/18/20 1315 .  ascorbic acid (VITAMIN C) tablet 500 mg, 500 mg, Per Tube, Daily, Emiley Digiacomo, MD, 500 mg at 08/15/20 0902 .  aspirin chewable tablet 81 mg, 81 mg, Per Tube, Daily, Lanney Gins, Rhylan Kagel, MD, 81 mg at 08/15/20 0902 .  chlorhexidine (PERIDEX) 0.12 % solution 15 mL, 15 mL, Mouth Rinse, BID, Kasa, Kurian, MD, 15 mL at 08/18/20 1055 .  Chlorhexidine Gluconate Cloth 2 % PADS 6 each, 6 each, Topical, Daily, Flora Lipps, MD, 6 each at 08/18/20 0119 .  cholecalciferol (VITAMIN D3) tablet 2,000 Units, 2,000 Units, Per Tube, Daily, Ottie Glazier, MD, 2,000 Units at 08/15/20 0902 .  collagenase (SANTYL) ointment, , Topical, Daily, Amery,  Woodward,  MD .  digoxin (LANOXIN) 0.25 MG/ML injection 0.25 mg, 0.25 mg, Intravenous, Daily, Lanney Gins, Dayshawn Irizarry, MD, 0.25 mg at 08/18/20 1112 .  famotidine (PEPCID) IVPB 20 mg premix, 20 mg, Intravenous, Q12H, Kovacich, Estill Bamberg, MD, Last Rate: 100 mL/hr at 08/18/20 1114, 20 mg at 08/18/20 1114 .  feeding supplement (ENSURE ENLIVE / ENSURE PLUS) liquid 237 mL, 237 mL, Oral, TID BM, Kasa, Kurian, MD .  guaiFENesin-dextromethorphan (ROBITUSSIN DM) 100-10 MG/5ML syrup 10 mL, 10 mL, Oral, Q4H PRN, Agbata, Tochukwu, MD .  haloperidol lactate (HALDOL) injection 5 mg, 5 mg, Intravenous, Q6H PRN, Florina Ou V, MD, 5 mg at 08/10/20 2338 .  heparin ADULT infusion 100 units/mL (25000 units/254mL sodium chloride 0.45%), 1,350 Units/hr, Intravenous, Continuous, Amery, Sahar, MD, Last Rate: 13.5 mL/hr at 08/17/20 2250, 1,350 Units/hr at 08/17/20 2250 .  HYDROcodone-acetaminophen (NORCO/VICODIN) 5-325 MG per tablet 1 tablet, 1 tablet, Oral, Q6H PRN, Lang Snow, NP, 1 tablet at 08/08/20 1730 .  insulin aspart (novoLOG) injection 0-9 Units, 0-9 Units, Subcutaneous, Q4H, Awilda Bill, NP, 1 Units at 08/18/20 0753 .  MEDLINE mouth rinse, 15 mL, Mouth Rinse, q12n4p, Kasa, Kurian, MD .  metoprolol tartrate (LOPRESSOR) injection 2.5-5 mg, 2.5-5 mg, Intravenous, Q2H PRN, Darel Hong D, NP, 5 mg at 08/14/20 1434 .  multivitamin with minerals tablet 1 tablet, 1 tablet, Per Tube, Daily, Ottie Glazier, MD, 1 tablet at 08/15/20 0902 .  nystatin (MYCOSTATIN/NYSTOP) topical powder, , Topical, TID, Darliss Cheney, MD, Given at 08/18/20 1117 .  ondansetron (ZOFRAN) tablet 4 mg, 4 mg, Oral, Q6H PRN **OR** ondansetron (ZOFRAN) injection 4 mg, 4 mg, Intravenous, Q6H PRN, Agbata, Tochukwu, MD, 4 mg at 08/11/20 1316 .  pneumococcal 23 valent vaccine (PNEUMOVAX-23) injection 0.5 mL, 0.5 mL, Intramuscular, Tomorrow-1000, Pahwani, Ravi, MD .  potassium chloride 10 mEq in 100 mL IVPB, 10 mEq, Intravenous, Q1 Hr x 6, Darel Hong D, NP, Last Rate: 100 mL/hr at 08/18/20 1313, 10 mEq at 08/18/20 1313 .  zinc sulfate capsule 220 mg, 220 mg, Per Tube, Daily, Ottie Glazier, MD, 220 mg at 08/15/20 0902    ALLERGIES   Codeine    REVIEW OF SYSTEMS    Unable to perform due to Sedation on MV  PHYSICAL EXAMINATION   Vital Signs: Temp:  [96.5 F (35.8 C)-97.8 F (36.6 C)] 97 F (36.1 C) (10/15 0400) Pulse Rate:  [78-119] 78 (10/15 1300) Resp:  [12-35] 12 (10/15 1300) BP: (113-169)/(65-103) 132/65 (10/15 1300) SpO2:  [82 %-100 %] 98 % (10/15 1300) FiO2 (%):  [80 %-90 %] 80 % (10/15 0400)  GENERAL: encephalopathic age approprate  HEAD: Normocephalic, atraumatic.  EYES: Pupils equal, round, reactive to light.  No scleral icterus.  MOUTH: dry mucosal membrane. NECK: Supple. No thyromegaly. No nodules. No JVD.  PULMONARY: rhonchi b/l CARDIOVASCULAR: S1 and S2. Regular rate and rhythm. No murmurs, rubs, or gallops.  GASTROINTESTINAL: Soft, nontender, non-distended. No masses. Positive bowel sounds. No hepatosplenomegaly.  MUSCULOSKELETAL: No swelling, clubbing, or edema.  NEUROLOGIC: Mild distress due to acute illness SKIN:numerous open skin leisions throughout abd , thorax , under breast, inguinal, sacral , periumbilical etc   PERTINENT DATA     Infusions: . sodium chloride Stopped (08/12/20 1532)  . amiodarone 30 mg/hr (08/18/20 0625)  . ampicillin-sulbactam (UNASYN) IV 3 g (08/18/20 1315)  . famotidine (PEPCID) IV 20 mg (08/18/20 1114)  . heparin 1,350 Units/hr (08/17/20 2250)  . potassium chloride 10 mEq (08/18/20 1313)   Scheduled Medications: . vitamin C  500  mg Per Tube Daily  . aspirin  81 mg Per Tube Daily  . chlorhexidine  15 mL Mouth Rinse BID  . Chlorhexidine Gluconate Cloth  6 each Topical Daily  . cholecalciferol  2,000 Units Per Tube Daily  . collagenase   Topical Daily  . digoxin  0.25 mg Intravenous Daily  . feeding supplement  237 mL Oral TID BM  . insulin aspart  0-9  Units Subcutaneous Q4H  . mouth rinse  15 mL Mouth Rinse q12n4p  . multivitamin with minerals  1 tablet Per Tube Daily  . nystatin   Topical TID  . pneumococcal 23 valent vaccine  0.5 mL Intramuscular Tomorrow-1000  . zinc sulfate  220 mg Per Tube Daily   PRN Medications: sodium chloride, acetaminophen **OR** acetaminophen, guaiFENesin-dextromethorphan, haloperidol lactate, HYDROcodone-acetaminophen, metoprolol tartrate, ondansetron **OR** ondansetron (ZOFRAN) IV Hemodynamic parameters:   Intake/Output: 10/14 0701 - 10/15 0700 In: 651.6 [P.O.:200; I.V.:280.3; IV Piggyback:171.2] Out: 2775 [Urine:2775]  Ventilator  Settings: FiO2 (%):  [80 %-90 %] 80 %     LAB RESULTS:  Basic Metabolic Panel: Recent Labs  Lab 08/14/20 0413 08/14/20 0413 08/15/20 0515 08/15/20 0515 08/16/20 0456 08/16/20 2222 08/17/20 0509 08/18/20 0441  NA 146*  --  141  --  139  --  137 138  K 3.5   < > 2.9*   < > 2.8*   < > 3.2* 2.7*  CL 106  --  100  --  97*  --  94* 93*  CO2 30  --  29  --  31  --  30 28  GLUCOSE 113*  --  100*  --  88  --  90 131*  BUN 17  --  12  --  10  --  8 8  CREATININE 0.92  --  0.97  --  0.88  --  0.89 0.91  CALCIUM 9.4  --  9.5  --  9.3  --  9.2 9.6  MG 1.8  --  1.9  --  1.8  --  1.9 1.5*  PHOS 2.8  --  2.5  --  2.1*  --  2.1* 3.3   < > = values in this interval not displayed.   Liver Function Tests: Recent Labs  Lab 08/14/20 0413 08/15/20 0515 08/16/20 0456 08/17/20 0509 08/18/20 0441  AST 23 27 26 28 28   ALT 39 35 29 30 29   ALKPHOS 55 60 58 66 69  BILITOT 0.7 1.1 1.1 1.4* 1.4*  PROT 6.0* 6.3* 6.0* 6.9 7.1  ALBUMIN 2.6* 2.7* 2.5* 2.7* 3.0*   No results for input(s): LIPASE, AMYLASE in the last 168 hours. No results for input(s): AMMONIA in the last 168 hours. CBC: Recent Labs  Lab 08/12/20 0523 08/12/20 0523 08/13/20 0405 08/13/20 0405 08/14/20 0413 08/15/20 0515 08/16/20 0616 08/17/20 0509 08/18/20 0441  WBC 7.4   < > 6.4   < > 7.1 7.2 6.6 6.4  10.1  NEUTROABS 5.8  --  4.7  --  5.8 5.1 4.6  --   --   HGB 10.0*   < > 10.5*   < > 10.8* 10.9* 11.4* 11.0* 11.8*  HCT 32.2*   < > 34.3*   < > 35.8* 34.9* 35.9* 33.9* 36.6  MCV 91.5   < > 90.7   < > 91.1 89.5 87.3 86.7 84.5  PLT 153   < > 144*   < > 158 155 167 163 138*   < > = values in this  interval not displayed.   Cardiac Enzymes: No results for input(s): CKTOTAL, CKMB, CKMBINDEX, TROPONINI in the last 168 hours. BNP: Invalid input(s): POCBNP CBG: Recent Labs  Lab 08/17/20 2000 08/17/20 2359 08/18/20 0444 08/18/20 0730 08/18/20 1144  GLUCAP 106* 115* 136* 123* 99       IMAGING RESULTS:  Imaging: DG Chest Port 1 View  Result Date: 08/18/2020 CLINICAL DATA:  Aspiration EXAM: PORTABLE CHEST 1 VIEW COMPARISON:  08/11/2020 FINDINGS: Endotracheal tube is no longer present. Unchanged position of left IJ central venous catheter. Bilateral airspace opacities are unchanged. Small pleural effusions. IMPRESSION: Unchanged bilateral airspace opacities. Electronically Signed   By: Ulyses Jarred M.D.   On: 08/18/2020 00:53       ASSESSMENT AND PLAN    -Multidisciplinary rounds held today Acute respiratory failure with hypoxia due to COVID-19 ARDS COVID-19 pneumonia S/p mechanical ventilation- liberated  S/p ARDS protocol, 6 cc/kg of PBW Completed remdesivir S/p Rocephin and azithromycin>>aspiration episode >>unasyn IV  -08/18/20-CXR this am reviewed - interval pulm edema post d/c of MV likely due to withdrawal of positive pressure ventilation with resultant negative pressure pulmonary edema and increased afterload on LV.   -need aggressvie BPH stat - BED vibration chest physiotherapy BID  - Diuresis - 20 Lasix BID    Atrial fibrillation with RVR Due to physiologic stress/hypoxia Hx: Hyperthyroidism treated with RAI Continue amiodarone and calcium channel blocker Cardiology following Continue cardiac monitoring Correct electrolytes as needed Heparin  infusion -reviewed care plan with cardiology - plan to transition to PO meds when able   Severe intertrigo Multiple skin breakdown areas Wound care following  ID -continue IV abx as prescibed -follow up cultures  GI/Nutrition GI PROPHYLAXIS as indicated DIET-->TF's as tolerated Constipation protocol as indicated  ENDO - ICU hypoglycemic\Hyperglycemia protocol -check FSBS per protocol   ELECTROLYTES -follow labs as needed -replace as needed -pharmacy consultation   DVT/GI PRX ordered -SCDs  TRANSFUSIONS AS NEEDED MONITOR FSBS ASSESS the need for LABS as needed   Critical care provider statement:    Critical care time (minutes):  33   Critical care time was exclusive of:  Separately billable procedures and treating other patients   Critical care was necessary to treat or prevent imminent or life-threatening deterioration of the following conditions:  Acute hypoxemic respiratory failure, multiple comoribid conditions.    Critical care was time spent personally by me on the following activities:  Development of treatment plan with patient or surrogate, discussions with consultants, evaluation of patient's response to treatment, examination of patient, obtaining history from patient or surrogate, ordering and performing treatments and interventions, ordering and review of laboratory studies and re-evaluation of patient's condition.  I assumed direction of critical care for this patient from another provider in my specialty: no    This document was prepared using Dragon voice recognition software and may include unintentional dictation errors.    Ottie Glazier, M.D.  Division of [place]

## 2020-08-18 NOTE — Progress Notes (Signed)
PT Cancellation Note  Patient Details Name: Courtney Anderson MRN: [id number] DOB: [birth year]   Cancelled Treatment:    Reason Eval/Treat Not Completed: Medical issues which prohibited therapy (Chart reviewed, RN consulted. Pt having difficulty with saturations and clearing secretions now being suctioned with RT/RN. Will hold session, to attempt again at later date/time.)  1:21 PM, 08/18/20 Etta Grandchild, PT, DPT Physical Therapist - [facility]  [phone] ([place])     Leake C 08/18/2020, 1:21 PM

## 2020-08-18 NOTE — Progress Notes (Signed)
Pharmacy Antibiotic Note  Courtney Anderson is a 64 y.o. female admitted on 07/24/2020 with pneumonia.  Pharmacy has been consulted for Unasyn dosing.  Plan: Unasyn 3gm IV q6hrs  Height: 5\' 5"  (165.1 cm) Weight: 109.9 kg (242 lb 4.6 oz) IBW/kg (Calculated) : 57  Temp (24hrs), Avg:97.5 F (36.4 C), Min:96.5 F (35.8 C), Max:98 F (36.7 C)  Recent Labs  Lab 08/13/20 0405 08/14/20 0413 08/15/20 0515 08/16/20 0456 08/16/20 0616 08/17/20 0509  WBC 6.4 7.1 7.2  --  6.6 6.4  CREATININE 0.93 0.92 0.97 0.88  --  0.89    Estimated Creatinine Clearance: 77.8 mL/min (by C-G formula based on SCr of 0.89 mg/dL).    Allergies  Allergen Reactions   Codeine     Antimicrobials this admission:   >>    >>   Dose adjustments this admission:   Microbiology results:  BCx:   UCx:    Sputum:    MRSA PCR:   Thank you for allowing pharmacy to be a part of this patients care.  Hart Robinsons A 08/18/2020 12:04 AM

## 2020-08-18 NOTE — Progress Notes (Signed)
Patient Name: Courtney Anderson Date of Encounter: 08/18/2020  Hospital Problem List     Principal Problem:   Sepsis [facility]) Active Problems:   Hypertension   Morbid obesity ([place])   Hypercalcemia   Hyperthyroidism   AKI (acute kidney injury) ([place])   COVID-19 virus infection   Atrial fibrillation with RVR ([place])   Hypokalemia    Patient Profile     64 yo with history of paroxysmal afib, covid 19 sepsis/pneumonia now extubated. On amiodarone and digoxin iv due to inability to take po.   Subjective   Difficult historian.  Inpatient Medications    . vitamin C  500 mg Per Tube Daily  . aspirin  81 mg Per Tube Daily  . chlorhexidine  15 mL Mouth Rinse BID  . Chlorhexidine Gluconate Cloth  6 each Topical Daily  . cholecalciferol  2,000 Units Per Tube Daily  . digoxin  0.25 mg Oral Daily  . feeding supplement  237 mL Oral TID BM  . insulin aspart  0-9 Units Subcutaneous Q4H  . mouth rinse  15 mL Mouth Rinse q12n4p  . multivitamin with minerals  1 tablet Per Tube Daily  . nystatin   Topical TID  . pneumococcal 23 valent vaccine  0.5 mL Intramuscular Tomorrow-1000  . zinc sulfate  220 mg Per Tube Daily    Vital Signs    Vitals:   08/18/20 0545 08/18/20 0600 08/18/20 0615 08/18/20 0630  BP:  (!) 143/86    Pulse: 84 97 92 94  Resp: (!) 23 19 (!) 24 (!) 25  Temp:      TempSrc:      SpO2: 100% 100% 100% 100%  Weight:      Height:        Intake/Output Summary (Last 24 hours) at 08/18/2020 0951 Last data filed at 08/18/2020 0600 Gross per 24 hour  Intake 651.55 ml  Output 2775 ml  Net -2123.45 ml   Filed Weights   08/05/20 0500 08/06/20 0412 08/12/20 0530  Weight: 108.4 kg 108.4 kg 109.9 kg    Physical Exam    GEN: Well nourished, well developed, in no acute distress.  HEENT: normal.  Neck: Supple, no JVD, carotid bruits, or masses. Cardiac: RRR, no murmurs, rubs, or gallops. No clubbing, cyanosis, edema.  Radials/DP/PT 2+ and equal bilaterally.  Respiratory:   Respirations regular and unlabored, clear to auscultation bilaterally. GI: Soft, nontender, nondistended, BS + x 4. MS: no deformity or atrophy. Skin: warm and dry, no rash. Neuro:  Strength and sensation are intact. Psych: Normal affect.  Labs    CBC Recent Labs    08/16/20 0616 08/16/20 0616 08/17/20 0509 08/18/20 0441  WBC 6.6   < > 6.4 10.1  NEUTROABS 4.6  --   --   --   HGB 11.4*   < > 11.0* 11.8*  HCT 35.9*   < > 33.9* 36.6  MCV 87.3   < > 86.7 84.5  PLT 167   < > 163 138*   < > = values in this interval not displayed.   Basic Metabolic Panel Recent Labs    08/17/20 0509 08/18/20 0441  NA 137 138  K 3.2* 2.7*  CL 94* 93*  CO2 30 28  GLUCOSE 90 131*  BUN 8 8  CREATININE 0.89 0.91  CALCIUM 9.2 9.6  MG 1.9 1.5*  PHOS 2.1* 3.3   Liver Function Tests Recent Labs    08/17/20 0509 08/18/20 0441  AST 28 28  ALT 30 29  ALKPHOS 66 69  BILITOT 1.4* 1.4*  PROT 6.9 7.1  ALBUMIN 2.7* 3.0*   No results for input(s): LIPASE, AMYLASE in the last 72 hours. Cardiac Enzymes No results for input(s): CKTOTAL, CKMB, CKMBINDEX, TROPONINI in the last 72 hours. BNP No results for input(s): BNP in the last 72 hours. D-Dimer No results for input(s): DDIMER in the last 72 hours. Hemoglobin A1C No results for input(s): HGBA1C in the last 72 hours. Fasting Lipid Panel No results for input(s): CHOL, HDL, LDLCALC, TRIG, CHOLHDL, LDLDIRECT in the last 72 hours. Thyroid Function Tests No results for input(s): TSH, T4TOTAL, T3FREE, THYROIDAB in the last 72 hours.  Invalid input(s): FREET3  Telemetry    nsr  ECG    afib  Radiology    DG Chest 1 View  Result Date: 07/29/2020 CLINICAL DATA:  Initial evaluation for acute tachycardia. History of COVID pneumonia. EXAM: CHEST  1 VIEW COMPARISON:  Prior CTA from 07/28/2020 and radiograph from 07/24/2020. FINDINGS: Transverse heart size stable. Mediastinal silhouette within normal limits. Lungs mildly hypoinflated. Severe  diffuse bilateral airspace disease, right greater than left, progressed and worsened as compared to prior radiograph from 07/24/2020. Scattered air bronchograms noted at the lung bases bilaterally. Underlying pulmonary vascular congestion without overt pulmonary edema. No visible pleural effusion. No pneumothorax. Osseous structures unchanged. IMPRESSION: Severe diffuse bilateral airspace disease, right greater than left, progressed and worsened as compared to previous, concerning for worsened multifocal pneumonia. Electronically Signed   By: Jeannine Boga M.D.   On: 07/29/2020 05:47   DG Abd 1 View  Result Date: 08/11/2020 CLINICAL DATA:  OG tube placement EXAM: ABDOMEN - 1 VIEW COMPARISON:  Radiograph 08/01/2020 FINDINGS: Transesophageal tube tip and side port distal to the GE junction, terminating at the level of the gastric antrum/proximal duodenum bulb. Persistent heterogeneous airspace opacities present in the lung bases likely with associated atelectasis and trace effusions. Multiple air-filled loops of small bowel and colon without frank dilatation or high-grade obstructive pattern. No suspicious calcifications. Likely edematous changes in the soft tissues. No acute osseous abnormality. IMPRESSION: 1. Transesophageal tube tip and side port distal to the GE junction, terminating at the level of the gastric antrum/proximal duodenum bulb. 2. Persistent bibasilar heterogeneous airspace opacities likely with atelectasis and trace effusions. Electronically Signed   By: Lovena Le M.D.   On: 08/11/2020 19:22   DG Abd 1 View  Result Date: 08/01/2020 CLINICAL DATA:  NG placement. EXAM: ABDOMEN - 1 VIEW COMPARISON:  Earlier today FINDINGS: Tip and side port of the enteric tube are now located below the diaphragm in the stomach. Similar bowel-gas pattern tear earlier today. IMPRESSION: Tip and side port of the enteric tube below the diaphragm in the stomach. Electronically Signed   By: Keith Rake  M.D.   On: 08/01/2020 21:56   DG Abd 1 View  Result Date: 08/01/2020 CLINICAL DATA:  OG tube placement EXAM: ABDOMEN - 1 VIEW COMPARISON:  07/31/2020 FINDINGS: Esophageal tube tip in the region of GE junction, side-port the level of distal esophagus. Upper abdominal gas pattern is unobstructed. Airspace disease at both bases. IMPRESSION: Esophageal tube tip overlies the GE junction, side-port overlies distal esophagus, consider further advancement by at least 10 cm for more optimal positioning. These results will be called to the ordering clinician or representative by the Radiologist Assistant, and communication documented in the PACS or [employer]. Electronically Signed   By: Donavan Foil M.D.   On: 08/01/2020 20:22  DG Abd 1 View  Result Date: 07/31/2020 CLINICAL DATA:  Orogastric tube placement EXAM: ABDOMEN - 1 VIEW COMPARISON:  July 30, 2020 FINDINGS: Orogastric tube tip is at the gastroesophageal junction. No bowel dilatation or air-fluid level to suggest bowel obstruction. No free air. IMPRESSION: Orogastric tube tip at gastroesophageal junction. Advise advancing orogastric tube 8-10 cm. No bowel obstruction or free air evident. Electronically Signed   By: Lowella Grip III M.D.   On: 07/31/2020 12:15   DG Abd 1 View  Result Date: 07/30/2020 CLINICAL DATA:  Evaluate enteric tube. EXAM: ABDOMEN - 1 VIEW COMPARISON:  Earlier same day FINDINGS: Enteric tube tip and side port projects over the central upper abdomen. Nonobstructed bowel gas pattern. Osseous structures unremarkable. IMPRESSION: Interval advancement of the enteric tube with the tip and side port projecting over the central upper abdomen. Recommend advancing the tube an additional 5 cm. Electronically Signed   By: Lovey Newcomer M.D.   On: 07/30/2020 16:49   DG Abd 1 View  Result Date: 07/30/2020 CLINICAL DATA:  COVID positive patient.  OG tube evaluation EXAM: ABDOMEN - 1 VIEW COMPARISON:  None. FINDINGS: Enteric tube  tip and side-port project within the distal esophagus, recommend advancement. Patchy airspace opacities lower lungs bilaterally. IMPRESSION: Enteric tube tip and side-port project within the distal esophagus, recommend advancement. These results will be called to the ordering clinician or representative by the Radiologist Assistant, and communication documented in the PACS or [employer]. Electronically Signed   By: Lovey Newcomer M.D.   On: 07/30/2020 13:11   CT ANGIO CHEST PE W OR WO CONTRAST  Result Date: 07/28/2020 CLINICAL DATA:  Respiratory failure. COVID positive. Rule out pulmonary embolism. EXAM: CT ANGIOGRAPHY CHEST WITH CONTRAST TECHNIQUE: Multidetector CT imaging of the chest was performed using the standard protocol during bolus administration of intravenous contrast. Multiplanar CT image reconstructions and MIPs were obtained to evaluate the vascular anatomy. CONTRAST:  168mL OMNIPAQUE IOHEXOL 350 MG/ML SOLN COMPARISON:  Chest 07/24/2020 FINDINGS: Cardiovascular: Excellent pulmonary artery opacification. Negative for pulmonary embolism. Moderate pulmonary artery enlargement compatible with pulmonary artery hypertension. Main pulmonary artery 33 mm in diameter. Cardiac enlargement. No pericardial effusion. Thoracic aorta normal in caliber. No significant opacification of the aorta. Mediastinum/Nodes: Negative for mediastinal mass or adenopathy. Lungs/Pleura: Severe diffuse bilateral airspace disease most prominent in the bases. No pleural effusion. Upper Abdomen: Negative Musculoskeletal: No acute skeletal abnormality. Review of the MIP images confirms the above findings. IMPRESSION: Negative for pulmonary embolism.  Pulmonary artery hypertension. Severe bilateral airspace disease most prominent the bases compatible with COVID pneumonia. Electronically Signed   By: Franchot Gallo M.D.   On: 07/28/2020 12:21   US RENAL  Result Date: 07/25/2020 CLINICAL DATA:  Acute renal failure. EXAM: RENAL /  URINARY TRACT ULTRASOUND COMPLETE COMPARISON:  None. FINDINGS: Right Kidney: Renal measurements: 12.0 x 4.5 x 3.9 cm = volume: 110 mL. Echogenicity within normal limits. No mass or hydronephrosis visualized. Left Kidney: Renal measurements: 12.7 x 5.6 x 4.4 cm = volume: 162 mL. Echogenicity within normal limits. No mass or hydronephrosis visualized. Bladder: Appears normal for degree of bladder distention. Other: None. IMPRESSION: Normal size kidneys without hydronephrosis. Electronically Signed   By: Marin Olp M.D.   On: 07/25/2020 16:40   DG Chest Port 1 View  Result Date: 08/18/2020 CLINICAL DATA:  Aspiration EXAM: PORTABLE CHEST 1 VIEW COMPARISON:  08/11/2020 FINDINGS: Endotracheal tube is no longer present. Unchanged position of left IJ central venous catheter. Bilateral airspace opacities  are unchanged. Small pleural effusions. IMPRESSION: Unchanged bilateral airspace opacities. Electronically Signed   By: Ulyses Jarred M.D.   On: 08/18/2020 00:53   DG Chest Port 1 View  Result Date: 08/11/2020 CLINICAL DATA:  64 year old female with respiratory failure. EXAM: PORTABLE CHEST 1 VIEW COMPARISON:  Chest radiograph dated 08/20/2020. FINDINGS: Endotracheal tube with tip in the proximal right mainstem bronchus. Recommend retraction by approximately 4 cm. Left IJ central venous line in similar position. Bilateral pulmonary densities, right greater left, appears similar to prior radiograph. Small bilateral pleural effusions may be present. No pneumothorax. Stable cardiac silhouette. No acute osseous pathology. IMPRESSION: 1. Endotracheal tube with tip in the proximal right mainstem bronchus. Recommend retraction by approximately 4 cm. 2. No significant interval change in bilateral pulmonary opacities. These results were called by telephone at the time of interpretation on 08/11/2020 at 3:39 pm to provider Adalberto Ill, who verbally acknowledged these results. Electronically Signed   By: Anner Crete  M.D.   On: 08/11/2020 15:50   DG Chest Port 1 View  Result Date: 08/10/2020 CLINICAL DATA:  Acute respiratory failure. EXAM: PORTABLE CHEST 1 VIEW COMPARISON:  08/07/2020.  07/31/2020. FINDINGS: Endotracheal tube, NG tube, left IJ line in stable position. Stable cardiomegaly. Multifocal bilateral pulmonary infiltrates are again noted. Small bilateral pleural effusions. No pneumothorax. IMPRESSION: 1. Lines and tubes in stable position. 2. Stable cardiomegaly. 3. Multifocal bilateral pulmonary infiltrates again noted. No interim change. Electronically Signed   By: Marcello Moores  Register   On: 08/10/2020 05:13   DG Chest Port 1 View  Result Date: 08/07/2020 CLINICAL DATA:  COVID-19 pneumonia. EXAM: PORTABLE CHEST 1 VIEW COMPARISON:  Abdominal x-ray dated August 01, 2020. Chest x-ray dated July 31, 2020. FINDINGS: Unchanged endotracheal and enteric tubes. Unchanged left internal jugular central venous catheter. Stable cardiomediastinal silhouette. Poor inspiratory effort compared to the prior study. Peripheral and basilar predominant patchy airspace disease in the right greater than left lungs is not significantly changed. No pneumothorax or pleural effusion. No acute osseous abnormality. IMPRESSION: 1. Unchanged multifocal pneumonia. Electronically Signed   By: Titus Dubin M.D.   On: 08/07/2020 10:00   DG Chest Port 1 View  Result Date: 07/31/2020 CLINICAL DATA:  Orogastric tube placement EXAM: PORTABLE CHEST 1 VIEW COMPARISON:  July 30, 2020. FINDINGS: Endotracheal tube tip is 6.0 cm above carina. Orogastric tube tip is at the gastroesophageal junction. Left jugular catheter tip is in the superior vena cava. No pneumothorax. Patchy airspace opacity is present bilaterally, primarily in the periphery of the upper and lower lung regions, essentially stable. Heart is upper normal in size with pulmonary vascularity normal. No adenopathy. No bone lesions. IMPRESSION: Tube and catheter positions as  described without pneumothorax. Note that the orogastric tube tip is at the gastroesophageal junction. Advise advancing orogastric tube 8-10 cm. Patchy airspace opacity bilaterally, stable. Stable cardiac silhouette. Electronically Signed   By: Lowella Grip III M.D.   On: 07/31/2020 12:14   DG Chest Port 1 View  Result Date: 07/30/2020 CLINICAL DATA:  Worsening COVID pneumonia.  Intubation. EXAM: PORTABLE CHEST 1 VIEW COMPARISON:  07/29/2020 FINDINGS: The endotracheal tube is 4.3 cm above the carina. The left IJ catheter tip is in the distal SVC near the cavoatrial junction. The NG tube is coursing down the esophagus and into the stomach. The tip is in the fundal region and the proximal port is near the GE junction. This could be advanced several cm. Persistent diffuse interstitial and airspace process in the lungs but  slight improved aeration after intubation. No pneumothorax. IMPRESSION: 1. Persistent infiltrates but slight improved aeration after intubation. 2. ET tube and left IJ catheter in good position. 3. NG tube tip in the fundal region and proximal port is near the GE junction. This could be advanced several cm. Electronically Signed   By: Marijo Sanes M.D.   On: 07/30/2020 13:11   DG Chest Port 1 View  Result Date: 07/24/2020 CLINICAL DATA:  Sepsis evaluation. EXAM: PORTABLE CHEST 1 VIEW COMPARISON:  07/25/2010 FINDINGS: Opacities throughout the lateral aspect of the right hemithorax. Evidence for air bronchograms at the medial right lung base. Slightly prominent left lung interstitial markings. Heart size is within normal limits for technique. Trachea is midline. IMPRESSION: Densities at the right lung base and along the lateral aspect of the right hemithorax. Findings likely represent a combination of pleural and parenchymal disease in the right chest. Findings could be better characterized with a chest CT with IV contrast. Electronically Signed   By: Markus Daft M.D.   On: 07/24/2020 09:32    ECHOCARDIOGRAM COMPLETE  Result Date: 07/26/2020    ECHOCARDIOGRAM REPORT   Patient Name:   Courtney Anderson Date of Exam: 07/25/2020 Medical Rec #:  [id number]      Height:       65.0 in Accession #:    [id number]     Weight:       175.0 lb Date of Birth:  12-09-1955      BSA:          1.869 m Patient Age:    68 years       BP:           86/57 mmHg Patient Gender: F              HR:           105 bpm. Exam Location:  [facility] Procedure: 2D Echo, Cardiac Doppler and Color Doppler Indications:     Dyspnea 786.09  History:         Patient has no prior history of Echocardiogram examinations.                  Risk Factors:Hypertension and Dyslipidemia.  Sonographer:     Sherrie Sport RDCS (AE) Referring Phys:  [id number] DWAYNE D CALLWOOD Diagnosing Phys: Yolonda Kida MD  Sonographer Comments: Technically difficult study due to poor echo windows and no apical window. Bandage in left apical space- no apical attempted. IMPRESSIONS  1. Techically difficult study.  2. Left ventricular ejection fraction, by estimation, is 45 to 50%. The left ventricle has mildly decreased function. The left ventricle demonstrates global hypokinesis. Left ventricular diastolic parameters are indeterminate.  3. Right ventricular systolic function is normal. The right ventricular size is normal.  4. The mitral valve is normal in structure. No evidence of mitral valve regurgitation.  5. The aortic valve is normal in structure. Aortic valve regurgitation is not visualized. Conclusion(s)/Recommendation(s): Poor windows for evaluation of left ventricular function by transthoracic echocardiography. Would recommend an alternative means of evaluation. FINDINGS  Left Ventricle: Left ventricular ejection fraction, by estimation, is 45 to 50%. The left ventricle has mildly decreased function. The left ventricle demonstrates global hypokinesis. The left ventricular internal cavity size was normal in size. There is  no left ventricular hypertrophy. Left  ventricular diastolic parameters are indeterminate. Right Ventricle: The right ventricular size is normal. No increase in right ventricular wall thickness. Right ventricular systolic function is normal. Left Atrium:  Left atrial size was normal in size. Right Atrium: Right atrial size was normal in size. Pericardium: There is no evidence of pericardial effusion. Mitral Valve: The mitral valve is normal in structure. No evidence of mitral valve regurgitation. Tricuspid Valve: The tricuspid valve is normal in structure. Tricuspid valve regurgitation is not demonstrated. Aortic Valve: The aortic valve is normal in structure. Aortic valve regurgitation is not visualized. Pulmonic Valve: The pulmonic valve was normal in structure. Pulmonic valve regurgitation is not visualized. Aorta: The ascending aorta was not well visualized. IAS/Shunts: No atrial level shunt detected by color flow Doppler. Additional Comments: Techically difficult study.  LEFT VENTRICLE PLAX 2D LVIDd:         3.90 cm LVIDs:         3.47 cm LV PW:         1.44 cm LV IVS:        0.99 cm LVOT diam:     2.20 cm LVOT Area:     3.80 cm  LEFT ATRIUM         Index LA diam:    5.50 cm 2.94 cm/m   AORTA Ao Root diam: 2.90 cm  SHUNTS Systemic Diam: 2.20 cm Dwayne D Callwood MD Electronically signed by Yolonda Kida MD Signature Date/Time: 07/26/2020/5:48:12 PM    Final     Assessment & Plan    Principal Problem:   Sepsis ([place]) Active Problems:   Hypertension   Morbid obesity ([place])   Hypercalcemia   Hyperthyroidism   AKI (acute kidney injury) ([place])   COVID-19 virus infection   Atrial fibrillation with RVR ([facility])   Hypokalemia    1.  Paroxysmal atrial fibrillation with rapid ventricular rate, exacerbated by probable aspiration pneumonia and recurrent respiratory failure with intubation, on amiodarone drip,modestlyimproved after addition of IV digoxin,now reextubated,Eliquis held until patient can safely swallow, on heparin drip.  She  converted to sinus rhythm at a rate of 90 bpm.Will continue with iv amiodarone, iv digoxin and iv heparin until taking po.  2.COVID-19 pneumonia/sepsis, extubated, followed by vomiting and probable aspiration pneumoniawith recurrent respiratory failure requiring reintubation,now extubated  Recommendations  1.Agree with current therapy 2.Continue amiodarone infusion,transition to 200 mg p.o. twice daily whenable to swallow safely 3.Continue dig 0.25 mg IV daily,transition to 0.25 g p.o. daily whenable to swallow safely 4.ResumeEliquis 5 mg twice daily when able to swallow safely   Signed, Javier Docker. Bridger Pizzi MD 08/18/2020, 9:51 AM  Pager: [phone]

## 2020-08-18 NOTE — Progress Notes (Addendum)
SLP Cancellation Note  Patient Details Name: Courtney Anderson MRN: [id number] DOB: 10/03/1956   Cancelled treatment:       Reason Eval/Treat Not Completed: Medical issues which prohibited therapy;Fatigue/lethargy limiting ability to participate;Patient not medically ready (chart reviewed; consulted NSG re: pt's status today). Per chart notes last evening(midnight), pt had an episode of coughing with copious amounts of secretions while pt on BiPAP(wearing d/t ABGs) which required oral suctioning. Event was followed by hypoxia and increasing FiO2 requirements, concerning for aspiration event of secretions. Pt is drowsy/lethargic still requiring oral suctioning, but will arouse and nod to some questions. Similar presentation this morning per NSG. Noted wbc and temp wnl this morning. Recommend holding on oral diet/NPO while pt is Not Fully alert/awake to safely participate in swallowing/oral intake. Recommend frequent oral care for hygiene and stimulation of swallowing. ST services will continue to follow pt's status for appropriateness to re-instate least restrictive oral diet. NSG agreed.      [name], MS, CCC-SLP Speech Language Pathologist Rehab Services [phone] [facility] 08/18/2020, 11:33 AM

## 2020-08-19 DIAGNOSIS — A419 Sepsis, unspecified organism: Secondary | ICD-10-CM | POA: Diagnosis not present

## 2020-08-19 DIAGNOSIS — I4891 Unspecified atrial fibrillation: Secondary | ICD-10-CM | POA: Diagnosis not present

## 2020-08-19 DIAGNOSIS — E876 Hypokalemia: Secondary | ICD-10-CM | POA: Diagnosis not present

## 2020-08-19 DIAGNOSIS — L98499 Non-pressure chronic ulcer of skin of other sites with unspecified severity: Secondary | ICD-10-CM

## 2020-08-19 DIAGNOSIS — L8990 Pressure ulcer of unspecified site, unspecified stage: Secondary | ICD-10-CM | POA: Insufficient documentation

## 2020-08-19 DIAGNOSIS — U071 COVID-19: Secondary | ICD-10-CM | POA: Diagnosis not present

## 2020-08-19 LAB — COMPREHENSIVE METABOLIC PANEL
ALT: 26 U/L (ref 0–44)
AST: 21 U/L (ref 15–41)
Albumin: 2.6 g/dL — ABNORMAL LOW (ref 3.5–5.0)
Alkaline Phosphatase: 64 U/L (ref 38–126)
Anion gap: 15 (ref 5–15)
BUN: 10 mg/dL (ref 8–23)
CO2: 27 mmol/L (ref 22–32)
Calcium: 10 mg/dL (ref 8.9–10.3)
Chloride: 94 mmol/L — ABNORMAL LOW (ref 98–111)
Creatinine, Ser: 1.04 mg/dL — ABNORMAL HIGH (ref 0.44–1.00)
GFR, Estimated: 56 mL/min — ABNORMAL LOW (ref >60)
Glucose, Bld: 85 mg/dL (ref 70–99)
Potassium: 3.2 mmol/L — ABNORMAL LOW (ref 3.5–5.1)
Sodium: 136 mmol/L (ref 135–145)
Total Bilirubin: 1.3 mg/dL — ABNORMAL HIGH (ref 0.3–1.2)
Total Protein: 6.7 g/dL (ref 6.5–8.1)

## 2020-08-19 LAB — CBC
HCT: 34 % — ABNORMAL LOW (ref 36.0–46.0)
HCT: 35.8 % — ABNORMAL LOW (ref 36.0–46.0)
Hemoglobin: 10.7 g/dL — ABNORMAL LOW (ref 12.0–15.0)
Hemoglobin: 11.3 g/dL — ABNORMAL LOW (ref 12.0–15.0)
MCH: 27.1 pg (ref 26.0–34.0)
MCH: 27.2 pg (ref 26.0–34.0)
MCHC: 31.5 g/dL (ref 30.0–36.0)
MCHC: 31.6 g/dL (ref 30.0–36.0)
MCV: 86.1 fL (ref 80.0–100.0)
MCV: 86.1 fL (ref 80.0–100.0)
Platelets: 111 10*3/uL — ABNORMAL LOW (ref 150–400)
Platelets: 110 10*3/uL — ABNORMAL LOW (ref 150–400)
RBC: 3.95 MIL/uL (ref 3.87–5.11)
RBC: 4.16 MIL/uL (ref 3.87–5.11)
RDW: 16.2 % — ABNORMAL HIGH (ref 11.5–15.5)
RDW: 16 % — ABNORMAL HIGH (ref 11.5–15.5)
WBC: 6.8 10*3/uL (ref 4.0–10.5)
WBC: 5.9 10*3/uL (ref 4.0–10.5)
nRBC: 0 % (ref 0.0–0.2)
nRBC: 0.3 % — ABNORMAL HIGH (ref 0.0–0.2)

## 2020-08-19 LAB — GLUCOSE, CAPILLARY
Glucose-Capillary: 77 mg/dL (ref 70–99)
Glucose-Capillary: 66 mg/dL — ABNORMAL LOW (ref 70–99)
Glucose-Capillary: 98 mg/dL (ref 70–99)
Glucose-Capillary: 71 mg/dL (ref 70–99)
Glucose-Capillary: 69 mg/dL — ABNORMAL LOW (ref 70–99)
Glucose-Capillary: 105 mg/dL — ABNORMAL HIGH (ref 70–99)
Glucose-Capillary: 80 mg/dL (ref 70–99)
Glucose-Capillary: 90 mg/dL (ref 70–99)

## 2020-08-19 LAB — BASIC METABOLIC PANEL
Anion gap: 14 (ref 5–15)
BUN: 11 mg/dL (ref 8–23)
CO2: 27 mmol/L (ref 22–32)
Calcium: 10.1 mg/dL (ref 8.9–10.3)
Chloride: 95 mmol/L — ABNORMAL LOW (ref 98–111)
Creatinine, Ser: 1.03 mg/dL — ABNORMAL HIGH (ref 0.44–1.00)
GFR, Estimated: 57 mL/min — ABNORMAL LOW (ref >60)
Glucose, Bld: 77 mg/dL (ref 70–99)
Potassium: 3.4 mmol/L — ABNORMAL LOW (ref 3.5–5.1)
Sodium: 136 mmol/L (ref 135–145)

## 2020-08-19 LAB — MAGNESIUM
Magnesium: 1.8 mg/dL (ref 1.7–2.4)
Magnesium: 2.3 mg/dL (ref 1.7–2.4)

## 2020-08-19 LAB — POTASSIUM: Potassium: 3.5 mmol/L (ref 3.5–5.1)

## 2020-08-19 LAB — HEPARIN LEVEL (UNFRACTIONATED)
Heparin Unfractionated: 0.82 IU/mL — ABNORMAL HIGH (ref 0.30–0.70)
Heparin Unfractionated: 0.51 IU/mL (ref 0.30–0.70)
Heparin Unfractionated: 0.53 IU/mL (ref 0.30–0.70)

## 2020-08-19 LAB — APTT: aPTT: 128 seconds — ABNORMAL HIGH (ref 24–36)

## 2020-08-19 LAB — PHOSPHORUS: Phosphorus: 2.9 mg/dL (ref 2.5–4.6)

## 2020-08-19 MED ORDER — DEXTROSE 50 % IV SOLN
INTRAVENOUS | Status: AC
  Administered 2020-08-19: 12.5 g via INTRAVENOUS
  Filled 2020-08-19: qty 50

## 2020-08-19 MED ORDER — HEPARIN (PORCINE) 25000 UT/250ML-% IV SOLN
1200.0000 [IU]/h | INTRAVENOUS | Status: DC
  Administered 2020-08-19 – 2020-08-22 (×3): 1200 [IU]/h via INTRAVENOUS
  Filled 2020-08-19 (×4): qty 250

## 2020-08-19 MED ORDER — DEXTROSE 50 % IV SOLN: 12.5000 g | INTRAVENOUS | Status: AC

## 2020-08-19 MED ORDER — POTASSIUM CHLORIDE 10 MEQ/50ML IV SOLN
10.0000 meq | INTRAVENOUS | Status: AC
  Administered 2020-08-19 (×4): 10 meq via INTRAVENOUS
  Filled 2020-08-19 (×4): qty 50

## 2020-08-19 MED ORDER — DEXTROSE 50 % IV SOLN
INTRAVENOUS | Status: AC
  Filled 2020-08-19: qty 50

## 2020-08-19 MED ORDER — DEXTROSE 50 % IV SOLN
12.5000 g | INTRAVENOUS | Status: AC
  Administered 2020-08-19: 12.5 g via INTRAVENOUS

## 2020-08-19 MED ORDER — POTASSIUM CHLORIDE 10 MEQ/50ML IV SOLN
10.0000 meq | INTRAVENOUS | Status: DC
  Filled 2020-08-19 (×3): qty 50

## 2020-08-19 MED ORDER — POTASSIUM CHLORIDE 10 MEQ/100ML IV SOLN
10.0000 meq | INTRAVENOUS | Status: DC
  Filled 2020-08-19 (×4): qty 100

## 2020-08-19 MED ORDER — POTASSIUM CHLORIDE CRYS ER 20 MEQ PO TBCR: 40.0000 meq | EXTENDED_RELEASE_TABLET | Freq: Once | ORAL | Status: DC

## 2020-08-19 MED ORDER — POTASSIUM CHLORIDE 10 MEQ/100ML IV SOLN
10.0000 meq | INTRAVENOUS | Status: AC
  Administered 2020-08-19 (×4): 10 meq via INTRAVENOUS
  Filled 2020-08-19 (×4): qty 100

## 2020-08-19 MED ORDER — POTASSIUM CHLORIDE 10 MEQ/100ML IV SOLN
10.0000 meq | INTRAVENOUS | Status: AC
  Administered 2020-08-19 – 2020-08-20 (×3): 10 meq via INTRAVENOUS
  Filled 2020-08-19 (×3): qty 100

## 2020-08-19 MED ORDER — MAGNESIUM SULFATE 2 GM/50ML IV SOLN
2.0000 g | Freq: Once | INTRAVENOUS | Status: AC
  Administered 2020-08-19: 2 g via INTRAVENOUS
  Filled 2020-08-19: qty 50

## 2020-08-19 NOTE — Progress Notes (Signed)
Shift summary:  - Lethargic today; unsafe for POs. NPO for now.

## 2020-08-19 NOTE — Progress Notes (Signed)
CRITICAL CARE PROGRESS NOTE    Name: Courtney Anderson MRN: [id number] DOB: 1956-01-31     LOS: 38   SUBJECTIVE FINDINGS & SIGNIFICANT EVENTS    Patient description:  Reason for Consult: Severe respiratory distress and patient with COVID-19 pneumonia. Referring Physician: Florina Ou, MD  Courtney Anderson is an 64 y.o. female.  HPI: Patient is a 64 year old female with history noted below, who was admitted to [facility] on 2020-07-24 brought in via EMS as a "code sepsis".  Patient subsequently tested positive for COVID-19.  She presented with oxygen saturations of 69% on room air and had to be placed on nonrebreather mask to achieve oxygen saturations in the 90s.  She was subsequently admitted to the Covid unit for management of COVID-19 pneumonia.  She was noted to have acute kidney injury she was treated with remdesivir, zinc, vitamin C and antitussives.  She was placed empirically on Rocephin and azithromycin.  She continued to have increased FiO2 requirements.  She was refusing to self prone.  She had atrial fibrillation and was seen by cardiology to assist in management.   07/31/20- patient is on 90% FiO2, will modify meds as patient is on metoprolol and levophed as well as cardizem.  Will d/c propofol to improve SBP and remove arythmogenic vasopressor if able.  Plan to have CVP trending and diuresis today.   Goals of care discussion today - patient made DNR per sister and son of patient. I had 2 separate phone meetings with son and separate conversation with sister today to explain patient is worse with shock and renal failure requiring multiple pressors.   08/01/20- patient remains critically ill. FiO2 has been weaned down to 80% today.  08/03/20- patient remains critically ill shes being proned, weaning FiO2 as able.    08/04/20- patient with no interval changes continue with weaning and proning protocol.  Continue weaning FiO2 and PEEP as to optimize for SBT 08/06/20- patient is weaned down 40%FiO2 today, will attempt awakening trial with SBT, I spoke to son [name] today, he appreciates update and does not want Elink video view at this time.  08/07/20- patient is requiring 50%FiO2 today 08/08/20- Continuing to wean vent. FiO2 is 28%, will trial SBT today 08/09/20-tachypneic and hypertensive with SBT. Asynchronous.FiO2 back up to 50% 10/7 severeresp failure, attempt SAT/SBT 10/7 extubated to biPAP 10/8 Tachycardic , more confused,patient re-intubated ETT 8.0 10/9 remains on vent 10/11 severe resp failure on vent 10/12 failed weaning trial 10/12 successfully extubated 10/14 TRANSFER TO TRH 10/15- passed speech and swallow but then had aspiration event. Despite this we were able towean her HFNC and plan to transition to nasal canula.  She is high risk for aspiration still.  Wounds around torso - moisture associated and torso/abdomen/inguinal/sacral/intertriginous.  Will reconsult wound care and will consider PICC for TPN. Will discuss with family PEG/OG/TPN. I spoke with son [name] he is very greatful and thankful.  08/19/20- Son [name] at bedside, patient is clinically improved and being optimized to handle PO medications with plan to transition out of MICU.     Lines/tubes : Airway 7.5 mm (Active)  Secured at (cm) 23 cm 07/31/20 0806  Measured From Lips 07/31/20 [place] 07/31/20 0806  Secured By [employer] 07/31/20 0806  Tube Holder Repositioned Yes 07/31/20 0806  Cuff Pressure (cm H2O) 26 cm H2O 07/31/20 0806  Site Condition Dry 07/31/20 0806     CVC Triple Lumen 07/30/20 Left Internal jugular (Active)  Indication for Insertion or Continuance of Line Vasoactive infusions 07/30/20 2010  Site Assessment Clean;Dry;Intact 07/30/20 2010  Proximal Lumen Status  Infusing 07/30/20 2010  Medial Lumen Status Infusing 07/30/20 2010  Distal Lumen Status Infusing 07/30/20 2010  Dressing Type Transparent;Occlusive 07/30/20 2010  Dressing Status Clean;Dry;Intact 07/30/20 2010  Line Care Connections checked and tightened 07/30/20 2010  Dressing Change Due 08/06/20 07/30/20 2010     NG/OG Tube [facility] mouth Xray Documented cm marking at nare/ corner of mouth 75 cm (Active)  Cm Marking at Nare/Corner of Mouth (if applicable) 75 cm 32/20/25 2010  Site Assessment Clean;Dry;Intact 07/30/20 2010  Ongoing Placement Verification No change in cm markings or external length of tube from initial placement;No acute changes, not attributed to clinical condition;No change in respiratory status;Xray 07/30/20 2010  Status Suction-low intermittent 07/30/20 2010  Amount of suction 80 mmHg 07/30/20 2010  Drainage Appearance [employer] Shark;Coffee ground 07/30/20 2010     Urethral Catheter Olen Pel Double-lumen;Latex 16 Fr. (Active)  Indication for Insertion or Continuance of Catheter Unstable critically ill patients first 24-48 hours (See Criteria) 07/31/20 0752  Site Assessment Clean;Intact 07/30/20 2000  Catheter Maintenance Bag below level of bladder;Catheter secured;Drainage bag/tubing not touching floor;Insertion date on drainage bag;No dependent loops;Seal intact 07/30/20 2000  Collection Container Standard drainage bag 07/30/20 2000  Securement Method Securing device (Describe) 07/30/20 2000  Urinary Catheter Interventions (if applicable) Unclamped 42/70/62 2000  Output (mL) 75 mL 07/31/20 0600    Microbiology/Sepsis markers: Results for orders placed or performed during the hospital encounter of 07/24/20  Urine culture     Status: Abnormal   Collection Time: 07/24/20  9:12 AM   Specimen: Urine, Random  Result Value Ref Range Status   Specimen Description   Final    URINE, RANDOM Performed at [facility], [address] [place], [place]  [address]    Special Requests   Final    NONE Performed at [facility], [address], [place], [place] [address]    Culture (A)  Final    <10,000 COLONIES/mL >=100,000 COLONIES/mL Performed at [facility], [place] [address] [place], [place] [address]    Report Status 07/25/2020 FINAL  Final  Blood culture (routine single)     Status: None   Collection Time: 07/24/20  9:14 AM   Specimen: BLOOD LEFT ARM  Result Value Ref Range Status   Specimen Description BLOOD LEFT ARM  Final   Special Requests   Final    BOTTLES DRAWN AEROBIC AND ANAEROBIC Blood Culture adequate volume   Culture   Final    NO GROWTH 5 DAYS Performed at [facility], [address] [place],  [address]    Report Status 07/29/2020 FINAL  Final  SARS Coronavirus 2 by RT PCR (hospital order, performed in [place] hospital lab) Nasopharyngeal Nasopharyngeal Swab     Status: Abnormal   Collection Time: 07/24/20  9:16 AM   Specimen: Nasopharyngeal Swab  Result Value Ref Range Status   SARS Coronavirus 2 POSITIVE (A) NEGATIVE Final    Comment: RESULT CALLED TO, READ BACK BY AND VERIFIED WITH:  SAMANTHA HAMILTON AT 2694 07/24/20 SDR (NOTE) SARS-CoV-2 target nucleic acids are DETECTED  SARS-CoV-2 RNA is generally detectable in upper respiratory specimens  during the acute phase of infection.  Positive results are indicative  of the presence of the identified virus, but do not rule out bacterial infection or co-infection with other pathogens not detected by the test.  Clinical  correlation with patient history and  other diagnostic information is necessary to determine patient infection status.  The expected result is negative.  Fact Sheet for Patients:   StrictlyIdeas.no   Fact Sheet for Healthcare Providers:   BankingDealers.co.za    This test is not yet approved or cleared by the [place] FDA and  has been authorized for  detection and/or diagnosis of SARS-CoV-2 by FDA under an Emergency Use Authorization (EUA).  This EUA will remain in effect (meaning thi s test can be used) for the duration of  the COVID-19 declaration under Section 564(b)(1) of the Act, 21 U.S.C. section 360-bbb-3(b)(1), unless the authorization is terminated or revoked sooner.  Performed at [facility], [address] [place], [place] [address]   MRSA PCR Screening     Status: None   Collection Time: 07/30/20 11:08 AM   Specimen: Nasopharyngeal  Result Value Ref Range Status   MRSA by PCR NEGATIVE NEGATIVE Final    Comment:        The GeneXpert MRSA Assay (FDA approved for NASAL specimens only), is one component of a comprehensive MRSA colonization surveillance program. It is not intended to diagnose MRSA infection nor to guide or monitor treatment for MRSA infections. Performed at [facility], [address] [place], [place] [address]   Culture, respiratory (non-expectorated)     Status: None   Collection Time: 08/01/20 10:46 AM   Specimen: Tracheal Aspirate; Respiratory  Result Value Ref Range Status   Specimen Description   Final    TRACHEAL ASPIRATE Performed at [facility], [address], [place], [place] [address]    Special Requests   Final    NONE Performed at [facility], [place], [place] [address]    Gram Stain NO WBC SEEN NO ORGANISMS SEEN   Final   Culture   Final    RARE Normal respiratory flora-no Staph aureus or Pseudomonas seen Performed at [facility] [address], [place], [place] [address]    Report Status 08/03/2020 FINAL  Final    Anti-infectives:  Anti-infectives (From admission, onward)   Start     Dose/Rate Route Frequency Ordered Stop   08/18/20 0100  Ampicillin-Sulbactam (UNASYN) 3 g in sodium chloride 0.9 % 100 mL IVPB        3 g 200 mL/hr over 30 Minutes Intravenous Every 6 hours 08/18/20 0004     08/10/20  1600  fluconazole (DIFLUCAN) IVPB 200 mg        200 mg 100 mL/hr over 60 Minutes Intravenous Every 24 hours 08/10/20 1304 08/18/20 1200   08/01/20 1200  ceFEPIme (MAXIPIME) 2 g in sodium chloride 0.9 % 100 mL IVPB  Status:  Discontinued        2 g 200 mL/hr over 30 Minutes Intravenous Every 12 hours 08/01/20 1026 08/03/20 1014   07/25/20 1000  azithromycin (ZITHROMAX) 500 mg in sodium chloride 0.9 % 250 mL IVPB  Status:  Discontinued        500 mg 250 mL/hr over 60 Minutes Intravenous Every 24 hours 07/24/20 1108 07/24/20 1317   07/25/20 1000  remdesivir 100 mg in sodium chloride 0.9 % 100 mL IVPB       "Followed by" Linked Group Details   100 mg 200 mL/hr over 30 Minutes Intravenous Daily 07/24/20 1112 07/28/20 1544   07/24/20 1300  fluconazole (DIFLUCAN) tablet 100 mg  Status:  Discontinued        100  mg Oral Daily 07/24/20 1251 07/25/20 1522   07/24/20 1200  remdesivir 200 mg in sodium chloride 0.9% 250 mL IVPB       "Followed by" Linked Group Details   200 mg 580 mL/hr over 30 Minutes Intravenous Once 07/24/20 1112 07/24/20 1424   07/24/20 1115  cefTRIAXone (ROCEPHIN) 2 g in sodium chloride 0.9 % 100 mL IVPB  Status:  Discontinued        2 g 200 mL/hr over 30 Minutes Intravenous Every 24 hours 07/24/20 1108 07/24/20 1113   07/24/20 1000  cefTRIAXone (ROCEPHIN) 2 g in sodium chloride 0.9 % 100 mL IVPB  Status:  Discontinued        2 g 200 mL/hr over 30 Minutes Intravenous Every 24 hours 07/24/20 0947 07/28/20 1438   07/24/20 1000  azithromycin (ZITHROMAX) 500 mg in sodium chloride 0.9 % 250 mL IVPB  Status:  Discontinued        500 mg 250 mL/hr over 60 Minutes Intravenous Every 24 hours 07/24/20 0947 07/28/20 1438       Consults: Treatment Team:  Yolonda Kida, MD Dionisio David, MD Isaias Cowman, MD Teodoro Spray, MD     PAST MEDICAL HISTORY   Past Medical History:  Diagnosis Date  . Allergy   . Elevated transaminase level   . Hyperlipidemia   .  Hypertension   . Obesity      SURGICAL HISTORY   Past Surgical History:  Procedure Laterality Date  . BREAST SURGERY    . ECTOPIC PREGNANCY SURGERY       FAMILY HISTORY   Family History  Problem Relation Age of Onset  . Hypertension Father   . Stroke Father   . Hypertension Mother   . Arthritis Mother   . Aneurysm Sister   . Hypertension Sister   . Hypertension Brother   . Hypertension Sister   . Hypertension Brother      SOCIAL HISTORY   Social History   Tobacco Use  . Smoking status: Former Research scientist (life sciences)  . Smokeless tobacco: Never Used  Substance Use Topics  . Alcohol use: Yes    Alcohol/week: 0.0 standard drinks    Comment: on occasion  . Drug use: No     MEDICATIONS   Current Medication:  Current Facility-Administered Medications:  .  0.9 %  sodium chloride infusion, , Intravenous, PRN, Flora Lipps, MD, Stopped at 08/12/20 1532 .  acetaminophen (TYLENOL) tablet 650 mg, 650 mg, Oral, Q6H PRN, 650 mg at 08/09/20 2127 **OR** acetaminophen (TYLENOL) suppository 650 mg, 650 mg, Rectal, Q6H PRN, Agbata, Tochukwu, MD .  amiodarone (NEXTERONE PREMIX) 360-4.14 MG/200ML-% (1.8 mg/mL) IV infusion, 30 mg/hr, Intravenous, Continuous, Darel Hong D, NP, Last Rate: 16.67 mL/hr at 08/19/20 0800, 30 mg/hr at 08/19/20 0800 .  Ampicillin-Sulbactam (UNASYN) 3 g in sodium chloride 0.9 % 100 mL IVPB, 3 g, Intravenous, Q6H, Hall, Scott A, RPH, Last Rate: 200 mL/hr at 08/19/20 1218, 3 g at 08/19/20 1218 .  aspirin chewable tablet 81 mg, 81 mg, Per Tube, Daily, Lanney Gins, Avnoor Koury, MD, 81 mg at 08/15/20 0902 .  Chlorhexidine Gluconate Cloth 2 % PADS 6 each, 6 each, Topical, Daily, Flora Lipps, MD, 6 each at 08/18/20 2204 .  cholecalciferol (VITAMIN D3) tablet 2,000 Units, 2,000 Units, Per Tube, Daily, Ottie Glazier, MD, 2,000 Units at 08/15/20 0902 .  collagenase (SANTYL) ointment, , Topical, Daily, Nolberto Hanlon, MD, Given at 08/18/20 1603 .  digoxin (LANOXIN) 0.25 MG/ML injection  0.25 mg, 0.25 mg,  Intravenous, Daily, Ottie Glazier, MD, 0.25 mg at 08/19/20 1058 .  famotidine (PEPCID) IVPB 20 mg premix, 20 mg, Intravenous, Q12H, Kovacich, Estill Bamberg, MD, Last Rate: 100 mL/hr at 08/19/20 1102, 20 mg at 08/19/20 1102 .  furosemide (LASIX) injection 20 mg, 20 mg, Intravenous, BID, Lanney Gins, Jamarii Banks, MD, 20 mg at 08/19/20 0721 .  guaiFENesin-dextromethorphan (ROBITUSSIN DM) 100-10 MG/5ML syrup 10 mL, 10 mL, Oral, Q4H PRN, Agbata, Tochukwu, MD .  heparin ADULT infusion 100 units/mL (25000 units/256mL sodium chloride 0.45%), 1,200 Units/hr, Intravenous, Continuous, Hall, Scott A, RPH, Last Rate: 12 mL/hr at 08/19/20 0800, 1,200 Units/hr at 08/19/20 0800 .  insulin aspart (novoLOG) injection 0-9 Units, 0-9 Units, Subcutaneous, Q4H, Awilda Bill, NP, 1 Units at 08/18/20 0753 .  MEDLINE mouth rinse, 15 mL, Mouth Rinse, q12n4p, Kasa, Kurian, MD, 15 mL at 08/19/20 1215 .  metoprolol tartrate (LOPRESSOR) injection 2.5-5 mg, 2.5-5 mg, Intravenous, Q2H PRN, Darel Hong D, NP, 5 mg at 08/14/20 1434 .  multivitamin with minerals tablet 1 tablet, 1 tablet, Per Tube, Daily, Ottie Glazier, MD, 1 tablet at 08/15/20 0902 .  nystatin (MYCOSTATIN/NYSTOP) topical powder, , Topical, TID, Darliss Cheney, MD, Given at 08/18/20 2204 .  ondansetron (ZOFRAN) tablet 4 mg, 4 mg, Oral, Q6H PRN **OR** ondansetron (ZOFRAN) injection 4 mg, 4 mg, Intravenous, Q6H PRN, Agbata, Tochukwu, MD, 4 mg at 08/11/20 1316 .  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  EYES: Pupils equal, round, reactive to light.  No scleral icterus.  MOUTH: dry mucosal membrane. NECK: Supple. No thyromegaly. No nodules. No JVD.  PULMONARY: rhonchi b/l- improved CARDIOVASCULAR: S1 and S2. Regular rate and rhythm. No murmurs, rubs, or gallops.  GASTROINTESTINAL: Soft, nontender, non-distended. No masses. Positive bowel sounds. No hepatosplenomegaly.  MUSCULOSKELETAL: No swelling, clubbing, or edema.  NEUROLOGIC: Mild distress due to acute illness SKIN:numerous open skin leisions throughout abd , thorax , under breast, inguinal, sacral , periumbilical etc   PERTINENT DATA     Infusions: . sodium chloride Stopped (08/12/20 1532)  . amiodarone 30 mg/hr (08/19/20 0800)  . ampicillin-sulbactam (UNASYN) IV 3 g (08/19/20 1218)  . famotidine (PEPCID) IV 20 mg (08/19/20 1102)  . heparin 1,200 Units/hr (08/19/20 0800)   Scheduled Medications: . aspirin  81 mg Per Tube Daily  . Chlorhexidine Gluconate Cloth  6 each Topical Daily  . cholecalciferol  2,000 Units Per Tube Daily  . collagenase   Topical Daily  . digoxin  0.25 mg Intravenous Daily  . furosemide  20 mg Intravenous BID  . insulin aspart  0-9 Units Subcutaneous Q4H  . mouth rinse  15 mL Mouth Rinse q12n4p  . multivitamin with minerals  1 tablet Per Tube Daily  . nystatin   Topical TID  . pneumococcal 23 valent vaccine  0.5 mL Intramuscular Tomorrow-1000   PRN Medications: sodium chloride, acetaminophen **OR** acetaminophen, guaiFENesin-dextromethorphan, metoprolol tartrate, ondansetron **OR** ondansetron (ZOFRAN)  IV Hemodynamic parameters:   Intake/Output: 10/15 0701 - 10/16 0700 In: 2905.7 [I.V.:1022.2; IV Piggyback:1883.5] Out: 6468 [EHOZY:2482]  Ventilator  Settings: FiO2 (%):  [40 %] 40 %     LAB RESULTS:  Basic Metabolic Panel: Recent Labs  Lab 08/15/20 0515  08/15/20 0515 08/16/20 0456 08/16/20 2222 08/17/20 0509 08/17/20 0509 08/18/20 0441 08/18/20 0441 08/18/20 1829 08/19/20 0219  NA 141  --  139  --  137  --  138  --   --  136  K 2.9*   < > 2.8*   < > 3.2*   < > 2.7*   < > 3.2* 3.2*  CL 100  --  97*  --  94*  --  93*  --   --  94*  CO2 29  --  31  --  30  --  28  --   --  27  GLUCOSE 100*  --  88  --  90  --  131*  --   --  85  BUN 12  --  10  --  8  --  8  --   --  10  CREATININE 0.97  --  0.88  --  0.89  --  0.91  --   --  1.04*  CALCIUM 9.5  --  9.3  --  9.2  --  9.6  --   --  10.0  MG 1.9  --  1.8  --  1.9  --  1.5*  --   --  1.8  PHOS 2.5  --  2.1*  --  2.1*  --  3.3  --   --  2.9   < > = values in this interval not displayed.   Liver Function Tests: Recent Labs  Lab 08/15/20 0515 08/16/20 0456 08/17/20 0509 08/18/20 0441 08/19/20 0219  AST 27 26 28 28 21   ALT 35 29 30 29 26   ALKPHOS 60 58 66 69 64  BILITOT 1.1 1.1 1.4* 1.4* 1.3*  PROT 6.3* 6.0* 6.9 7.1 6.7  ALBUMIN 2.7* 2.5* 2.7* 3.0* 2.6*   No results for input(s): LIPASE, AMYLASE in the last 168 hours. No results for input(s): AMMONIA in the last 168 hours. CBC: Recent Labs  Lab 08/13/20 0405 08/13/20 0405 08/14/20 0413 08/14/20 0413 08/15/20 0515 08/16/20 5003 08/17/20 0509 08/18/20 0441 08/19/20 0219  WBC 6.4   < > 7.1   < > 7.2 6.6 6.4 10.1 6.8  NEUTROABS 4.7  --  5.8  --  5.1 4.6  --   --   --   HGB 10.5*   < > 10.8*   < > 10.9* 11.4* 11.0* 11.8* 10.7*  HCT 34.3*   < > 35.8*   < > 34.9* 35.9* 33.9* 36.6 34.0*  MCV 90.7   < > 91.1   < > 89.5 87.3 86.7 84.5 86.1  PLT 144*   < > 158   < > 155 167 163 138* 111*   < > = values in this interval not displayed.   Cardiac Enzymes: No results for input(s): CKTOTAL, CKMB, CKMBINDEX, TROPONINI in the last 168 hours. BNP: Invalid input(s): POCBNP CBG: Recent Labs  Lab 08/18/20 2314 08/19/20 0407 08/19/20 0714 08/19/20 0802 08/19/20 1213  GLUCAP 90 77 66* 98 71       IMAGING  RESULTS:  Imaging: DG Chest Port 1 View  Result Date: 08/18/2020 CLINICAL DATA:  Aspiration EXAM: PORTABLE CHEST 1 VIEW COMPARISON:  08/11/2020 FINDINGS: Endotracheal tube  is no longer present. Unchanged position of left IJ central venous catheter. Bilateral airspace opacities are unchanged. Small pleural effusions. IMPRESSION: Unchanged bilateral airspace opacities. Electronically Signed   By: Ulyses Jarred M.D.   On: 08/18/2020 00:53       ASSESSMENT AND PLAN    -Multidisciplinary rounds held today Acute respiratory failure with hypoxia due to COVID-19 ARDS COVID-19 pneumonia S/p mechanical ventilation- liberated  S/p ARDS protocol, 6 cc/kg of PBW Completed remdesivir S/p Rocephin and azithromycin>>aspiration episode >>unasyn IV  -08/18/20-CXR this am reviewed - interval pulm edema post d/c of MV likely due to withdrawal of positive pressure ventilation with resultant negative pressure pulmonary edema and increased afterload on LV.   -need aggressvie BPH stat - BED vibration chest physiotherapy BID  - Diuresis - 20 Lasix BID  08/19/20-1150cc urine overnight.   Atrial fibrillation with RVR Due to physiologic stress/hypoxia Hx: Hyperthyroidism treated with RAI Continue amiodarone and calcium channel blocker Cardiology following Continue cardiac monitoring Correct electrolytes as needed Heparin infusion -reviewed care plan with cardiology - plan to transition to PO meds when able   Severe intertrigo Multiple skin breakdown areas Wound care following  ID -continue IV abx as prescibed -follow up cultures  GI/Nutrition GI PROPHYLAXIS as indicated DIET-->TF's as tolerated Constipation protocol as indicated  ENDO - ICU hypoglycemic\Hyperglycemia protocol -check FSBS per protocol   ELECTROLYTES -follow labs as needed -replace as needed -pharmacy consultation   DVT/GI PRX ordered -SCDs  TRANSFUSIONS AS NEEDED MONITOR FSBS ASSESS the need for LABS as  needed   Critical care provider statement:    Critical care time (minutes):  33   Critical care time was exclusive of:  Separately billable procedures and treating other patients   Critical care was necessary to treat or prevent imminent or life-threatening deterioration of the following conditions:  Acute hypoxemic respiratory failure, multiple comoribid conditions.    Critical care was time spent personally by me on the following activities:  Development of treatment plan with patient or surrogate, discussions with consultants, evaluation of patient's response to treatment, examination of patient, obtaining history from patient or surrogate, ordering and performing treatments and interventions, ordering and review of laboratory studies and re-evaluation of patient's condition.  I assumed direction of critical care for this patient from another provider in my specialty: no    This document was prepared using Dragon voice recognition software and may include unintentional dictation errors.    Ottie Glazier, M.D.  Division of [place]

## 2020-08-19 NOTE — Progress Notes (Signed)
Physical Therapy Treatment Patient Details Name: Courtney Anderson MRN: [id number] DOB: 08/27/1956 Today's Date: 08/19/2020    History of Present Illness Courtney Anderson is a 55yoF PMH: HTN, hypoTSH, who comes to [facility] on 9/20 after 1 weeks of cough, congestion, weakness, intermittent fever chills. Pt admitted with code sepsis, (+) COVID19. Pt having issues with AF/RVR upon arrival. Pt evaluated by PT on 9/23, then transfered to ICU due to decline in respiratory status. New order for PT received 10/13.    PT Comments    Pt was seen for mobility to get to side of bed, and then did partial standing bedside.  Pt is reluctant, and yet is interested in trying to get there. Cognition continues to be fluctuating, with repetitive cues for safety and direction of movement being needed. Follow up with her to get into chair and work on sitting endurance, improving  her tolerance for being up and increase LE strength with progression of standing and transfers to chair.  Pt is so far able to control HR and sats with this mobility, and will work with nursing staff to establish safe limits of cardiopulm tolerance.  Vitals and labs were acceptable today, and will continue with PT goals of therapy.  Follow Up Recommendations  SNF     Equipment Recommendations  None recommended by PT    Recommendations for Other Services       Precautions / Restrictions Precautions Precautions: Fall Precaution Comments: multiple wounds Restrictions Weight Bearing Restrictions: No    Mobility  Bed Mobility Overal bed mobility: Needs Assistance Bed Mobility: Supine to Sit;Sit to Supine     Supine to sit: Mod assist Sit to supine: Mod assist   General bed mobility comments: mod assist to support trunk to sit up and mod to return to bed with nurse and PT lifting legs  Transfers Overall transfer level: Needs assistance Equipment used: 1 person hand held assist Transfers: Sit to/from Stand Sit to Stand: Mod assist;Max  assist         General transfer comment: pt exhibited an effort to use legs to try to stand, did so partially  Ambulation/Gait             General Gait Details: unable to walk   Stairs             Wheelchair Mobility    Modified Rankin (Stroke Patients Only)       Balance Overall balance assessment: Needs assistance Sitting-balance support: Feet supported;Bilateral upper extremity supported Sitting balance-Leahy Scale: Fair     Standing balance support: Bilateral upper extremity supported;During functional activity Standing balance-Leahy Scale: Poor                              Cognition Arousal/Alertness: Lethargic Behavior During Therapy: Flat affect Overall Cognitive Status: No family/caregiver present to determine baseline cognitive functioning                                 General Comments: pt is a little confused but is motivated to sit on side of bed, but quick to ask to get back      Exercises      General Comments General comments (skin integrity, edema, etc.): pt is up to side of bed with ability to prop on her elbow, tired from the effort and sats were maintained on O2  Pertinent Vitals/Pain Pain Assessment: No/denies pain    Home Living                      Prior Function            PT Goals (current goals can now be found in the care plan section) Acute Rehab PT Goals Patient Stated Goal: return to home, feel better Progress towards PT goals: Progressing toward goals    Frequency    Min 2X/week      PT Plan Current plan remains appropriate    Co-evaluation              AM-PAC PT "6 Clicks" Mobility   Outcome Measure  Help needed turning from your back to your side while in a flat bed without using bedrails?: A Lot Help needed moving from lying on your back to sitting on the side of a flat bed without using bedrails?: A Lot Help needed moving to and from a bed to a chair  (including a wheelchair)?: A Lot Help needed standing up from a chair using your arms (e.g., wheelchair or bedside chair)?: A Lot Help needed to walk in hospital room?: Total Help needed climbing 3-5 steps with a railing? : Total 6 Click Score: 10    End of Session Equipment Utilized During Treatment: Oxygen Activity Tolerance: Patient limited by lethargy;No increased pain;Patient tolerated treatment well Patient left: in bed;with call bell/phone within reach Nurse Communication: Mobility status PT Visit Diagnosis: Other abnormalities of gait and mobility (R26.89);Difficulty in walking, not elsewhere classified (R26.2)     Time: 6754-4920 PT Time Calculation (min) (ACUTE ONLY): 26 min  Charges:  $Therapeutic Activity: 23-37 mins                     Ramond Dial 08/19/2020, 4:59 PM  Mee Hives, PT MS Acute Rehab Dept. Number: [place]

## 2020-08-19 NOTE — Progress Notes (Signed)
[place] for Electrolyte Monitoring and Replacement   Recent Labs: Potassium (mmol/L)  Date Value  08/19/2020 3.4 (L)   Magnesium (mg/dL)  Date Value  08/19/2020 2.3   Calcium (mg/dL)  Date Value  08/19/2020 10.1   Calcium, Total (PTH) (mg/dL)  Date Value  07/24/2020 10.1   Albumin (g/dL)  Date Value  08/19/2020 2.6 (L)  10/22/2019 4.2   Phosphorus (mg/dL)  Date Value  08/19/2020 2.9   Sodium (mmol/L)  Date Value  08/19/2020 136  10/22/2019 139   Assessment: 64 year old female with PMHx of HTN, HLD who was admitted with COVID-19 PNA, also with A-fib with RVR and severe intertrigo. Patient extubated 10/12. Remains NPO pending speech eval which patient has refused.  Patient continues to receive potassium and magnesium replacement daily likely s/t NPO status. MD started furosemide 20 mg IV BID. Will likely worsen hypokalemia and require increased replacement.  Goal of Therapy:  Potassium 4.0 - 5.1 mmol/L Magnesium 2.0 - 2.4 mg/dL All Other Electrolytes WNL  Plan:  10/16 1231  K 3.4  Mag 2.3  Scr 1.03 Patient on digoxin, furosemide 20mg  IV BID  Will order KCl 53mEq IV x 4 again  Recheck potassium this evening  Follow up all electrolytes daily as warranted  Nyeemah Jennette A, PharmD 08/19/2020 1:28 PM

## 2020-08-19 NOTE — Progress Notes (Signed)
SLP Cancellation Note  Patient Details Name: Courtney Anderson MRN: [id number] DOB: [birth year]   Cancelled treatment:       Reason Eval/Treat Not Completed: Patient declined, no reason specified (chart reviewed; attempted to work w/ pt). Upon entering room, pt was noted to be disheveled in bed lying to one side and not correcting this herself. This SLP introduced herself, role, and offered po trials. Pt would not allow SLP to position her more centered, upright in the bed, and pt became Agitated the more SLP offered po's to her. She was NOT receptive to working w/ this SLP to eat/drink anything. This was similar behavior noted ~2-3 days ago. Left room and re-attempted again offering sips of water, applesauce but again, pt refused stating in an Agitated manner that she "knew what we were doing to her", and that she "need the ambulance called to take me home". In the end, pt stated to "leave me alone".  If necessary for medication intake, recommend NSG trial her w/ plain Applesauce to assess safety (pt was safe w/ this consistency ~2-3 days ago). Maybe this would encourage oral intake/cooperation in general. Recommend MD talk w/ the pt to [facility] her to work w/ NSG/SLP in taking Applesauce for meds(Crushed) as well as other po trials in order to establish an oral diet hopefully. ST services will f/u on Monday for the remainder of the oral diet trials, if pt will allow. Unsure of pt's Baseline Cognitive status vs possible delirium d/t extended illness vs Covid19+.   NSG and MD updated. Recommend frequent oral care and hygiene and stimulation of swallowing.     [name], MS, CCC-SLP Speech Language Pathologist Rehab Services [phone]  [facility] 08/19/2020, 10:36 AM

## 2020-08-19 NOTE — Progress Notes (Signed)
Patient Name: Courtney Anderson Date of Encounter: 08/19/2020  Hospital Problem List     Principal Problem:   Sepsis [facility]) Active Problems:   Hypertension   Morbid obesity ([place])   Hypercalcemia   Hyperthyroidism   AKI (acute kidney injury) ([place])   COVID-19 virus infection   Atrial fibrillation with RVR (HCC)   Hypokalemia   Pressure injury of skin    Patient Profile     64 yo with history of paroxysmal afib, covid 19 sepsis/pneumonia now extubated. On amiodarone and digoxin iv due to inability to take po.    Subjective   Still somewhat lethargic but more alert.  Inpatient Medications    . aspirin  81 mg Per Tube Daily  . Chlorhexidine Gluconate Cloth  6 each Topical Daily  . cholecalciferol  2,000 Units Per Tube Daily  . collagenase   Topical Daily  . digoxin  0.25 mg Intravenous Daily  . furosemide  20 mg Intravenous BID  . insulin aspart  0-9 Units Subcutaneous Q4H  . mouth rinse  15 mL Mouth Rinse q12n4p  . multivitamin with minerals  1 tablet Per Tube Daily  . nystatin   Topical TID  . pneumococcal 23 valent vaccine  0.5 mL Intramuscular Tomorrow-1000    Vital Signs    Vitals:   08/19/20 1000 08/19/20 1100 08/19/20 1200 08/19/20 1300  BP: 122/65 137/83 120/62 124/70  Pulse: 71 81 77 75  Resp: 14 15 (!) 21 (!) 21  Temp:   (!) 97.2 F (36.2 C)   TempSrc:   Axillary   SpO2: 100% 95% 100% 99%  Weight:      Height:        Intake/Output Summary (Last 24 hours) at 08/19/2020 1325 Last data filed at 08/19/2020 1300 Gross per 24 hour  Intake 3639.22 ml  Output 2760 ml  Net 879.22 ml   Filed Weights   08/05/20 0500 08/06/20 0412 08/12/20 0530  Weight: 108.4 kg 108.4 kg 109.9 kg    Physical Exam   HEENT: normal.  Neck: Supple, no JVD, carotid bruits, or masses. Cardiac: RRR, no murmurs, rubs, or gallops. No clubbing, cyanosis, edema.  Radials/DP/PT 2+ and equal bilaterally.  Respiratory: Decreased breath sounds bilaterally GI: Soft, nontender,  nondistended, BS + x 4. MS: no deformity or atrophy. Skin: warm and dry, no rash. Neuro: Lethargic but responds more appropriately. Labs    CBC Recent Labs    08/19/20 0219 08/19/20 1231  WBC 6.8 5.9  HGB 10.7* 11.3*  HCT 34.0* 35.8*  MCV 86.1 86.1  PLT 111* 323*   Basic Metabolic Panel Recent Labs    08/18/20 0441 08/18/20 1829 08/19/20 0219 08/19/20 1231  NA 138  --  136 136  K 2.7*   < > 3.2* 3.4*  CL 93*  --  94* 95*  CO2 28  --  27 27  GLUCOSE 131*  --  85 77  BUN 8  --  10 11  CREATININE 0.91  --  1.04* 1.03*  CALCIUM 9.6  --  10.0 10.1  MG 1.5*  --  1.8 2.3  PHOS 3.3  --  2.9  --    < > = values in this interval not displayed.   Liver Function Tests Recent Labs    08/18/20 0441 08/19/20 0219  AST 28 21  ALT 29 26  ALKPHOS 69 64  BILITOT 1.4* 1.3*  PROT 7.1 6.7  ALBUMIN 3.0* 2.6*   No results for input(s): LIPASE,  AMYLASE in the last 72 hours. Cardiac Enzymes No results for input(s): CKTOTAL, CKMB, CKMBINDEX, TROPONINI in the last 72 hours. BNP No results for input(s): BNP in the last 72 hours. D-Dimer No results for input(s): DDIMER in the last 72 hours. Hemoglobin A1C No results for input(s): HGBA1C in the last 72 hours. Fasting Lipid Panel No results for input(s): CHOL, HDL, LDLCALC, TRIG, CHOLHDL, LDLDIRECT in the last 72 hours. Thyroid Function Tests No results for input(s): TSH, T4TOTAL, T3FREE, THYROIDAB in the last 72 hours.  Invalid input(s): FREET3  Telemetry    Sinus rhythm  ECG    A. fib with RVR  Radiology    DG Chest 1 View  Result Date: 07/29/2020 CLINICAL DATA:  Initial evaluation for acute tachycardia. History of COVID pneumonia. EXAM: CHEST  1 VIEW COMPARISON:  Prior CTA from 07/28/2020 and radiograph from 07/24/2020. FINDINGS: Transverse heart size stable. Mediastinal silhouette within normal limits. Lungs mildly hypoinflated. Severe diffuse bilateral airspace disease, right greater than left, progressed and worsened  as compared to prior radiograph from 07/24/2020. Scattered air bronchograms noted at the lung bases bilaterally. Underlying pulmonary vascular congestion without overt pulmonary edema. No visible pleural effusion. No pneumothorax. Osseous structures unchanged. IMPRESSION: Severe diffuse bilateral airspace disease, right greater than left, progressed and worsened as compared to previous, concerning for worsened multifocal pneumonia. Electronically Signed   By: Jeannine Boga M.D.   On: 07/29/2020 05:47   DG Abd 1 View  Result Date: 08/11/2020 CLINICAL DATA:  OG tube placement EXAM: ABDOMEN - 1 VIEW COMPARISON:  Radiograph 08/01/2020 FINDINGS: Transesophageal tube tip and side port distal to the GE junction, terminating at the level of the gastric antrum/proximal duodenum bulb. Persistent heterogeneous airspace opacities present in the lung bases likely with associated atelectasis and trace effusions. Multiple air-filled loops of small bowel and colon without frank dilatation or high-grade obstructive pattern. No suspicious calcifications. Likely edematous changes in the soft tissues. No acute osseous abnormality. IMPRESSION: 1. Transesophageal tube tip and side port distal to the GE junction, terminating at the level of the gastric antrum/proximal duodenum bulb. 2. Persistent bibasilar heterogeneous airspace opacities likely with atelectasis and trace effusions. Electronically Signed   By: Lovena Le M.D.   On: 08/11/2020 19:22   DG Abd 1 View  Result Date: 08/01/2020 CLINICAL DATA:  NG placement. EXAM: ABDOMEN - 1 VIEW COMPARISON:  Earlier today FINDINGS: Tip and side port of the enteric tube are now located below the diaphragm in the stomach. Similar bowel-gas pattern tear earlier today. IMPRESSION: Tip and side port of the enteric tube below the diaphragm in the stomach. Electronically Signed   By: Keith Rake M.D.   On: 08/01/2020 21:56   DG Abd 1 View  Result Date: 08/01/2020 CLINICAL  DATA:  OG tube placement EXAM: ABDOMEN - 1 VIEW COMPARISON:  07/31/2020 FINDINGS: Esophageal tube tip in the region of GE junction, side-port the level of distal esophagus. Upper abdominal gas pattern is unobstructed. Airspace disease at both bases. IMPRESSION: Esophageal tube tip overlies the GE junction, side-port overlies distal esophagus, consider further advancement by at least 10 cm for more optimal positioning. These results will be called to the ordering clinician or representative by the Radiologist Assistant, and communication documented in the PACS or [employer]. Electronically Signed   By: Donavan Foil M.D.   On: 08/01/2020 20:22   DG Abd 1 View  Result Date: 07/31/2020 CLINICAL DATA:  Orogastric tube placement EXAM: ABDOMEN - 1 VIEW COMPARISON:  July 30, 2020 FINDINGS: Orogastric tube tip is at the gastroesophageal junction. No bowel dilatation or air-fluid level to suggest bowel obstruction. No free air. IMPRESSION: Orogastric tube tip at gastroesophageal junction. Advise advancing orogastric tube 8-10 cm. No bowel obstruction or free air evident. Electronically Signed   By: Lowella Grip III M.D.   On: 07/31/2020 12:15   DG Abd 1 View  Result Date: 07/30/2020 CLINICAL DATA:  Evaluate enteric tube. EXAM: ABDOMEN - 1 VIEW COMPARISON:  Earlier same day FINDINGS: Enteric tube tip and side port projects over the central upper abdomen. Nonobstructed bowel gas pattern. Osseous structures unremarkable. IMPRESSION: Interval advancement of the enteric tube with the tip and side port projecting over the central upper abdomen. Recommend advancing the tube an additional 5 cm. Electronically Signed   By: Lovey Newcomer M.D.   On: 07/30/2020 16:49   DG Abd 1 View  Result Date: 07/30/2020 CLINICAL DATA:  COVID positive patient.  OG tube evaluation EXAM: ABDOMEN - 1 VIEW COMPARISON:  None. FINDINGS: Enteric tube tip and side-port project within the distal esophagus, recommend advancement.  Patchy airspace opacities lower lungs bilaterally. IMPRESSION: Enteric tube tip and side-port project within the distal esophagus, recommend advancement. These results will be called to the ordering clinician or representative by the Radiologist Assistant, and communication documented in the PACS or [employer]. Electronically Signed   By: Lovey Newcomer M.D.   On: 07/30/2020 13:11   CT ANGIO CHEST PE W OR WO CONTRAST  Result Date: 07/28/2020 CLINICAL DATA:  Respiratory failure. COVID positive. Rule out pulmonary embolism. EXAM: CT ANGIOGRAPHY CHEST WITH CONTRAST TECHNIQUE: Multidetector CT imaging of the chest was performed using the standard protocol during bolus administration of intravenous contrast. Multiplanar CT image reconstructions and MIPs were obtained to evaluate the vascular anatomy. CONTRAST:  162mL OMNIPAQUE IOHEXOL 350 MG/ML SOLN COMPARISON:  Chest 07/24/2020 FINDINGS: Cardiovascular: Excellent pulmonary artery opacification. Negative for pulmonary embolism. Moderate pulmonary artery enlargement compatible with pulmonary artery hypertension. Main pulmonary artery 33 mm in diameter. Cardiac enlargement. No pericardial effusion. Thoracic aorta normal in caliber. No significant opacification of the aorta. Mediastinum/Nodes: Negative for mediastinal mass or adenopathy. Lungs/Pleura: Severe diffuse bilateral airspace disease most prominent in the bases. No pleural effusion. Upper Abdomen: Negative Musculoskeletal: No acute skeletal abnormality. Review of the MIP images confirms the above findings. IMPRESSION: Negative for pulmonary embolism.  Pulmonary artery hypertension. Severe bilateral airspace disease most prominent the bases compatible with COVID pneumonia. Electronically Signed   By: Franchot Gallo M.D.   On: 07/28/2020 12:21   US RENAL  Result Date: 07/25/2020 CLINICAL DATA:  Acute renal failure. EXAM: RENAL / URINARY TRACT ULTRASOUND COMPLETE COMPARISON:  None. FINDINGS: Right Kidney:  Renal measurements: 12.0 x 4.5 x 3.9 cm = volume: 110 mL. Echogenicity within normal limits. No mass or hydronephrosis visualized. Left Kidney: Renal measurements: 12.7 x 5.6 x 4.4 cm = volume: 162 mL. Echogenicity within normal limits. No mass or hydronephrosis visualized. Bladder: Appears normal for degree of bladder distention. Other: None. IMPRESSION: Normal size kidneys without hydronephrosis. Electronically Signed   By: Marin Olp M.D.   On: 07/25/2020 16:40   DG Chest Port 1 View  Result Date: 08/18/2020 CLINICAL DATA:  Aspiration EXAM: PORTABLE CHEST 1 VIEW COMPARISON:  08/11/2020 FINDINGS: Endotracheal tube is no longer present. Unchanged position of left IJ central venous catheter. Bilateral airspace opacities are unchanged. Small pleural effusions. IMPRESSION: Unchanged bilateral airspace opacities. Electronically Signed   By: Ulyses Jarred M.D.   On:  08/18/2020 00:53   DG Chest Port 1 View  Result Date: 08/11/2020 CLINICAL DATA:  64 year old female with respiratory failure. EXAM: PORTABLE CHEST 1 VIEW COMPARISON:  Chest radiograph dated 08/20/2020. FINDINGS: Endotracheal tube with tip in the proximal right mainstem bronchus. Recommend retraction by approximately 4 cm. Left IJ central venous line in similar position. Bilateral pulmonary densities, right greater left, appears similar to prior radiograph. Small bilateral pleural effusions may be present. No pneumothorax. Stable cardiac silhouette. No acute osseous pathology. IMPRESSION: 1. Endotracheal tube with tip in the proximal right mainstem bronchus. Recommend retraction by approximately 4 cm. 2. No significant interval change in bilateral pulmonary opacities. These results were called by telephone at the time of interpretation on 08/11/2020 at 3:39 pm to provider Adalberto Ill, who verbally acknowledged these results. Electronically Signed   By: Anner Crete M.D.   On: 08/11/2020 15:50   DG Chest Port 1 View  Result Date:  08/10/2020 CLINICAL DATA:  Acute respiratory failure. EXAM: PORTABLE CHEST 1 VIEW COMPARISON:  08/07/2020.  07/31/2020. FINDINGS: Endotracheal tube, NG tube, left IJ line in stable position. Stable cardiomegaly. Multifocal bilateral pulmonary infiltrates are again noted. Small bilateral pleural effusions. No pneumothorax. IMPRESSION: 1. Lines and tubes in stable position. 2. Stable cardiomegaly. 3. Multifocal bilateral pulmonary infiltrates again noted. No interim change. Electronically Signed   By: Marcello Moores  Register   On: 08/10/2020 05:13   DG Chest Port 1 View  Result Date: 08/07/2020 CLINICAL DATA:  COVID-19 pneumonia. EXAM: PORTABLE CHEST 1 VIEW COMPARISON:  Abdominal x-ray dated August 01, 2020. Chest x-ray dated July 31, 2020. FINDINGS: Unchanged endotracheal and enteric tubes. Unchanged left internal jugular central venous catheter. Stable cardiomediastinal silhouette. Poor inspiratory effort compared to the prior study. Peripheral and basilar predominant patchy airspace disease in the right greater than left lungs is not significantly changed. No pneumothorax or pleural effusion. No acute osseous abnormality. IMPRESSION: 1. Unchanged multifocal pneumonia. Electronically Signed   By: Titus Dubin M.D.   On: 08/07/2020 10:00   DG Chest Port 1 View  Result Date: 07/31/2020 CLINICAL DATA:  Orogastric tube placement EXAM: PORTABLE CHEST 1 VIEW COMPARISON:  July 30, 2020. FINDINGS: Endotracheal tube tip is 6.0 cm above carina. Orogastric tube tip is at the gastroesophageal junction. Left jugular catheter tip is in the superior vena cava. No pneumothorax. Patchy airspace opacity is present bilaterally, primarily in the periphery of the upper and lower lung regions, essentially stable. Heart is upper normal in size with pulmonary vascularity normal. No adenopathy. No bone lesions. IMPRESSION: Tube and catheter positions as described without pneumothorax. Note that the orogastric tube tip is at  the gastroesophageal junction. Advise advancing orogastric tube 8-10 cm. Patchy airspace opacity bilaterally, stable. Stable cardiac silhouette. Electronically Signed   By: Lowella Grip III M.D.   On: 07/31/2020 12:14   DG Chest Port 1 View  Result Date: 07/30/2020 CLINICAL DATA:  Worsening COVID pneumonia.  Intubation. EXAM: PORTABLE CHEST 1 VIEW COMPARISON:  07/29/2020 FINDINGS: The endotracheal tube is 4.3 cm above the carina. The left IJ catheter tip is in the distal SVC near the cavoatrial junction. The NG tube is coursing down the esophagus and into the stomach. The tip is in the fundal region and the proximal port is near the GE junction. This could be advanced several cm. Persistent diffuse interstitial and airspace process in the lungs but slight improved aeration after intubation. No pneumothorax. IMPRESSION: 1. Persistent infiltrates but slight improved aeration after intubation. 2. ET tube and left  IJ catheter in good position. 3. NG tube tip in the fundal region and proximal port is near the GE junction. This could be advanced several cm. Electronically Signed   By: Marijo Sanes M.D.   On: 07/30/2020 13:11   DG Chest Port 1 View  Result Date: 07/24/2020 CLINICAL DATA:  Sepsis evaluation. EXAM: PORTABLE CHEST 1 VIEW COMPARISON:  07/25/2010 FINDINGS: Opacities throughout the lateral aspect of the right hemithorax. Evidence for air bronchograms at the medial right lung base. Slightly prominent left lung interstitial markings. Heart size is within normal limits for technique. Trachea is midline. IMPRESSION: Densities at the right lung base and along the lateral aspect of the right hemithorax. Findings likely represent a combination of pleural and parenchymal disease in the right chest. Findings could be better characterized with a chest CT with IV contrast. Electronically Signed   By: Markus Daft M.D.   On: 07/24/2020 09:32   ECHOCARDIOGRAM COMPLETE  Result Date: 07/26/2020     ECHOCARDIOGRAM REPORT   Patient Name:   TRINATY BUNDRICK Date of Exam: 07/25/2020 Medical Rec #:  [id number]      Height:       65.0 in Accession #:    [id number]     Weight:       175.0 lb Date of Birth:  1956/08/01      BSA:          1.869 m Patient Age:    6 years       BP:           86/57 mmHg Patient Gender: F              HR:           105 bpm. Exam Location:  [facility] Procedure: 2D Echo, Cardiac Doppler and Color Doppler Indications:     Dyspnea 786.09  History:         Patient has no prior history of Echocardiogram examinations.                  Risk Factors:Hypertension and Dyslipidemia.  Sonographer:     Sherrie Sport RDCS (AE) Referring Phys:  [id number] DWAYNE D CALLWOOD Diagnosing Phys: Yolonda Kida MD  Sonographer Comments: Technically difficult study due to poor echo windows and no apical window. Bandage in left apical space- no apical attempted. IMPRESSIONS  1. Techically difficult study.  2. Left ventricular ejection fraction, by estimation, is 45 to 50%. The left ventricle has mildly decreased function. The left ventricle demonstrates global hypokinesis. Left ventricular diastolic parameters are indeterminate.  3. Right ventricular systolic function is normal. The right ventricular size is normal.  4. The mitral valve is normal in structure. No evidence of mitral valve regurgitation.  5. The aortic valve is normal in structure. Aortic valve regurgitation is not visualized. Conclusion(s)/Recommendation(s): Poor windows for evaluation of left ventricular function by transthoracic echocardiography. Would recommend an alternative means of evaluation. FINDINGS  Left Ventricle: Left ventricular ejection fraction, by estimation, is 45 to 50%. The left ventricle has mildly decreased function. The left ventricle demonstrates global hypokinesis. The left ventricular internal cavity size was normal in size. There is  no left ventricular hypertrophy. Left ventricular diastolic parameters are indeterminate. Right  Ventricle: The right ventricular size is normal. No increase in right ventricular wall thickness. Right ventricular systolic function is normal. Left Atrium: Left atrial size was normal in size. Right Atrium: Right atrial size was normal in size. Pericardium: There is no evidence of  pericardial effusion. Mitral Valve: The mitral valve is normal in structure. No evidence of mitral valve regurgitation. Tricuspid Valve: The tricuspid valve is normal in structure. Tricuspid valve regurgitation is not demonstrated. Aortic Valve: The aortic valve is normal in structure. Aortic valve regurgitation is not visualized. Pulmonic Valve: The pulmonic valve was normal in structure. Pulmonic valve regurgitation is not visualized. Aorta: The ascending aorta was not well visualized. IAS/Shunts: No atrial level shunt detected by color flow Doppler. Additional Comments: Techically difficult study.  LEFT VENTRICLE PLAX 2D LVIDd:         3.90 cm LVIDs:         3.47 cm LV PW:         1.44 cm LV IVS:        0.99 cm LVOT diam:     2.20 cm LVOT Area:     3.80 cm  LEFT ATRIUM         Index LA diam:    5.50 cm 2.94 cm/m   AORTA Ao Root diam: 2.90 cm  SHUNTS Systemic Diam: 2.20 cm Dwayne D Callwood MD Electronically signed by Yolonda Kida MD Signature Date/Time: 07/26/2020/5:48:12 PM    Final     Assessment & Plan    Principal Problem: Sepsis ([place]) Active Problems: Hypertension Morbid obesity ([place]) Hypercalcemia Hyperthyroidism AKI (acute kidney injury) ([place]) COVID-19 virus infection Atrial fibrillation with RVR ([facility]) Hypokalemia   1.Paroxysmalatrial fibrillation with rapid ventricular rate, exacerbated by probable aspiration pneumonia and recurrent respiratory failure with intubation, on amiodarone drip,modestlyimproved after addition of IV digoxin,now reextubated,Eliquis held until patient can safely swallow,on heparin drip. She converted to sinus rhythm at a rate of 90 bpm.Will continue  with iv amiodarone, iv digoxin and iv heparin until taking po.  2.COVID-19 pneumonia/sepsis, extubated, followed by vomiting and probable aspiration pneumoniawith recurrent respiratory failure requiring reintubation,now extubated  Recommendations  1.Agree with current therapy 2.Continue amiodarone infusion,transition to200 mgp.o.twice dailywhenable to swallow safely 3.Continue dig 0.25 mg IV daily,transition to0.25 gp.o.dailywhenable to swallow safely 4.ResumeEliquis 5 mg twice daily when able to swallow safely  Signed, Javier Docker. Lear Carstens MD 08/19/2020, 1:25 PM  Pager: [phone]

## 2020-08-19 NOTE — Progress Notes (Addendum)
[place] for Electrolyte Monitoring and Replacement   Recent Labs: Potassium (mmol/L)  Date Value  08/19/2020 3.2 (L)   Magnesium (mg/dL)  Date Value  08/19/2020 1.8   Calcium (mg/dL)  Date Value  08/19/2020 10.0   Calcium, Total (PTH) (mg/dL)  Date Value  07/24/2020 10.1   Albumin (g/dL)  Date Value  08/19/2020 2.6 (L)  10/22/2019 4.2   Phosphorus (mg/dL)  Date Value  08/19/2020 2.9   Sodium (mmol/L)  Date Value  08/19/2020 136  10/22/2019 139   Assessment: 64 year old female with PMHx of HTN, HLD who was admitted with COVID-19 PNA, also with A-fib with RVR and severe intertrigo. Patient extubated 10/12. Remains NPO pending speech eval which patient has refused.  Patient continues to receive potassium and magnesium replacement daily likely s/t NPO status. MD started furosemide 20 mg IV BID. Will likely worsen hypokalemia and require increased replacement.  Goal of Therapy:  Potassium 4.0 - 5.1 mmol/L Magnesium 2.0 - 2.4 mg/dL All Other Electrolytes WNL  Plan:   K 3.2 - Will order KCl 30mEq IV x4  Recheck potassium after runs complete  Mg 1.8 - will order Mag Sulfate 2gm x 1  Follow up all electrolytes daily as warranted  Ena Dawley, PharmD 08/19/2020 5:14 AM

## 2020-08-19 NOTE — Progress Notes (Signed)
Shift Summary:  - More alert today over yesterday.  - Still requiring supplemental O2.

## 2020-08-19 NOTE — Progress Notes (Addendum)
[place] for Electrolyte Monitoring and Replacement   Recent Labs: Potassium (mmol/L)  Date Value  08/19/2020 3.5   Magnesium (mg/dL)  Date Value  08/19/2020 2.3   Calcium (mg/dL)  Date Value  08/19/2020 10.1   Calcium, Total (PTH) (mg/dL)  Date Value  07/24/2020 10.1   Albumin (g/dL)  Date Value  08/19/2020 2.6 (L)  10/22/2019 4.2   Phosphorus (mg/dL)  Date Value  08/19/2020 2.9   Sodium (mmol/L)  Date Value  08/19/2020 136  10/22/2019 139   Assessment: 64 year old female with PMHx of HTN, HLD who was admitted with COVID-19 PNA, also with A-fib with RVR and severe intertrigo. Patient extubated 10/12. Remains NPO pending speech eval which patient has refused.  Patient continues to receive potassium and magnesium replacement daily likely s/t NPO status. MD started furosemide 20 mg IV BID. Will likely worsen hypokalemia and require increased replacement.  Goal of Therapy:  Potassium 4.0 - 5.1 mmol/L Magnesium 2.0 - 2.4 mg/dL All Other Electrolytes WNL  Plan:  10/16 1231  K 3.5  Mag 2.3  Scr 1.03 Patient on digoxin, furosemide 20mg  IV BID  Will order KCl 10 mEq IV x 3 runs   Recheck potassium tomorrow morning  Follow up all electrolytes daily as warranted  Rowland Lathe, PharmD 08/19/2020 9:33 PM

## 2020-08-19 NOTE — Progress Notes (Signed)
Pharmacy Antibiotic Note  Courtney Anderson is a 64 y.o. female admitted on 07/24/2020 with pneumonia.  Pharmacy has been consulted for Unasyn dosing.  Plan: Day 2- Unasyn 3gm IV q6hrs  Height: 5\' 5"  (165.1 cm) Weight: 109.9 kg (242 lb 4.6 oz) IBW/kg (Calculated) : 57  Temp (24hrs), Avg:97.1 F (36.2 C), Min:97 F (36.1 C), Max:97.4 F (36.3 C)  Recent Labs  Lab 08/15/20 0515 08/16/20 0456 08/16/20 0616 08/17/20 0509 08/18/20 0441 08/19/20 0219 08/19/20 1231  WBC   < >  --  6.6 6.4 10.1 6.8 5.9  CREATININE  --  0.88  --  0.89 0.91 1.04* 1.03*   < > = values in this interval not displayed.    Estimated Creatinine Clearance: 67.2 mL/min (A) (by C-G formula based on SCr of 1.03 mg/dL (H)).    Allergies  Allergen Reactions  . Codeine     Antimicrobials this admission:  Unasyn 10/15 >>    >>   Dose adjustments this admission:   Microbiology results:  BCx:   UCx:    Sputum:    MRSA PCR:   Thank you for allowing pharmacy to be a part of this patient's care.  Oceanna Arruda A 08/19/2020 1:48 PM

## 2020-08-19 NOTE — Progress Notes (Signed)
[place] for Heparin (switch from Eliquis) Indication: atrial fibrillation  Allergies  Allergen Reactions  . Codeine    Patient Measurements: Height: 5\' 5"  (165.1 cm) Weight: 109.9 kg (242 lb 4.6 oz) IBW/kg (Calculated) : 57 HEPARIN DW (KG): 73.7  Vital Signs: Temp: 97.2 F (36.2 C) (10/16 1200) Temp Source: Axillary (10/16 1200) BP: 124/70 (10/16 1300) Pulse Rate: 75 (10/16 1300)  Labs: Recent Labs    08/17/20 0509 08/17/20 0509 08/18/20 0441 08/18/20 0441 08/19/20 0219 08/19/20 1231  HGB 11.0*   < > 11.8*   < > 10.7* 11.3*  HCT 33.9*   < > 36.6  --  34.0* 35.8*  PLT 163   < > 138*  --  111* 110*  APTT 87*  --  73*  --  128*  --   HEPARINUNFRC  --   --  0.98*  --  0.82* 0.51  CREATININE 0.89   < > 0.91  --  1.04* 1.03*   < > = values in this interval not displayed.    Estimated Creatinine Clearance: 67.2 mL/min (A) (by C-G formula based on SCr of 1.03 mg/dL (H)).   Medical History: Past Medical History:  Diagnosis Date  . Allergy   . Elevated transaminase level   . Hyperlipidemia   . Hypertension   . Obesity    Assessment: Switching from Eliquis to IV Heparin as pt is now NPO.  Will use aPTT to adjust Heparin infusion. Last Eliquis 5mg  dose was 10/12 at 0900  Heparin was scheduled to be discontinued ~18:00 this afternoon and patient was to resume apixaban, however patient was too lethargic to swallow so heparin was continued.   10/13 2222 APTT 87 therapeutic x 1. Continue heparin drip at 1250 units/hr. Recheck APTT in am to confirm. CBC daily.  10/14 0509 aPTT 87, therapeutic x 2.  CBC stable.  Continue current rate and recheck aPTT and CBC in am.  10/15 0441 aPTT 73, therapeutic x 3.  HL 0.98 (not correlating).  H/H stable, PLTs are trending down 163 >> 138.  Will continue current rate and recheck HL/aPTT/CBC in am  10/16 0219 aPTT 128, HL 0.82 (correlates), SUPRAtherapeutic.  CBC worse.  Will hold heparin x 1 hour  then resume at 1200 units/hr and recheck HL in 6 hours.  Goal of Therapy:  APTT 66-102 HL 0.3 - 0.7 Monitor platelets by anticoagulation protocol: Yes   Plan:  10/16 1231 HL 0.51 therapeutic.  Will continue Heparin at 1200 units/hr and check confirmatory HL in 6 hours.  CBC in am  Rowland Ericsson A, PharmD 08/19/2020,1:10 PM

## 2020-08-19 NOTE — Progress Notes (Signed)
[place] for Heparin (switch from Eliquis) Indication: atrial fibrillation  Allergies  Allergen Reactions  . Codeine    Patient Measurements: Height: 5\' 5"  (165.1 cm) Weight: 109.9 kg (242 lb 4.6 oz) IBW/kg (Calculated) : 57 HEPARIN DW (KG): 73.7  Vital Signs: Temp: 97.8 F (36.6 C) (10/16 2000) Temp Source: Oral (10/16 2000) BP: 131/77 (10/16 2000) Pulse Rate: 69 (10/16 2000)  Labs: Recent Labs    08/17/20 0509 08/17/20 0509 08/18/20 0441 08/18/20 0441 08/19/20 0219 08/19/20 1231 08/19/20 2001  HGB 11.0*   < > 11.8*   < > 10.7* 11.3*  --   HCT 33.9*   < > 36.6  --  34.0* 35.8*  --   PLT 163   < > 138*  --  111* 110*  --   APTT 87*  --  73*  --  128*  --   --   HEPARINUNFRC  --   --  0.98*   < > 0.82* 0.51 0.53  CREATININE 0.89   < > 0.91  --  1.04* 1.03*  --    < > = values in this interval not displayed.    Estimated Creatinine Clearance: 67.2 mL/min (A) (by C-G formula based on SCr of 1.03 mg/dL (H)).   Medical History: Past Medical History:  Diagnosis Date  . Allergy   . Elevated transaminase level   . Hyperlipidemia   . Hypertension   . Obesity    Assessment: Switching from Eliquis to IV Heparin as pt is now NPO.  Will use aPTT to adjust Heparin infusion. Last Eliquis 5mg  dose was 10/12 at 0900  Heparin was scheduled to be discontinued ~18:00 this afternoon and patient was to resume apixaban, however patient was too lethargic to swallow so heparin was continued.   10/13 2222 APTT 87 therapeutic x 1. Continue heparin drip at 1250 units/hr. Recheck APTT in am to confirm. CBC daily.  10/14 0509 aPTT 87, therapeutic x 2.  CBC stable.  Continue current rate and recheck aPTT and CBC in am.  10/15 0441 aPTT 73, therapeutic x 3.  HL 0.98 (not correlating).  H/H stable, PLTs are trending down 163 >> 138.  Will continue current rate and recheck HL/aPTT/CBC in am  10/16 0219 aPTT 128, HL 0.82 (correlates),  SUPRAtherapeutic.  CBC worse.  Will hold heparin x 1 hour then resume at 1200 units/hr and recheck HL in 6 hours.  10/16 1231 HL 0.51 therapeutic.  Will continue Heparin at 1200 units/hr and check confirmatory HL in 6 hours.   Goal of Therapy:  APTT 66-102 HL 0.3 - 0.7 Monitor platelets by anticoagulation protocol: Yes   Plan:  10/16  HL 0.53 therapeutic x2.  Will continue Heparin at 1200 units/hr and check  HL in the morning.  CBC in am  Rowland Lathe, PharmD 08/19/2020,9:16 PM

## 2020-08-19 NOTE — Progress Notes (Signed)
[place] for Heparin (switch from Eliquis) Indication: atrial fibrillation  Allergies  Allergen Reactions   Codeine    Patient Measurements: Height: 5\' 5"  (165.1 cm) Weight: 109.9 kg (242 lb 4.6 oz) IBW/kg (Calculated) : 57 HEPARIN DW (KG): 73.7  Vital Signs: Temp: 97 F (36.1 C) (10/16 0400) Temp Source: Oral (10/16 0400) BP: 126/77 (10/16 0400) Pulse Rate: 70 (10/16 0445)  Labs: Recent Labs    08/17/20 0509 08/17/20 0509 08/18/20 0441 08/19/20 0219  HGB 11.0*   < > 11.8* 10.7*  HCT 33.9*  --  36.6 34.0*  PLT 163  --  138* 111*  APTT 87*  --  73* 128*  HEPARINUNFRC  --   --  0.98* 0.82*  CREATININE 0.89  --  0.91 1.04*   < > = values in this interval not displayed.    Estimated Creatinine Clearance: 66.6 mL/min (A) (by C-G formula based on SCr of 1.04 mg/dL (H)).   Medical History: Past Medical History:  Diagnosis Date   Allergy    Elevated transaminase level    Hyperlipidemia    Hypertension    Obesity    Assessment: Switching from Eliquis to IV Heparin as pt is now NPO.  Will use aPTT to adjust Heparin infusion. Last Eliquis 5mg  dose was 10/12 at 0900  Heparin was scheduled to be discontinued ~18:00 this afternoon and patient was to resume apixaban, however patient was too lethargic to swallow so heparin was continued.   Goal of Therapy:  APTT 66-102 HL 0.3 - 0.7 Monitor platelets by anticoagulation protocol: Yes   Plan:  10/13 2222 APTT 87 therapeutic x 1. Continue heparin drip at 1250 units/hr. Recheck APTT in am to confirm. CBC daily.  10/14 0509 aPTT 87, therapeutic x 2.  CBC stable.  Continue current rate and recheck aPTT and CBC in am.  10/15 0441 aPTT 73, therapeutic x 3.  HL 0.98 (not correlating).  H/H stable, PLTs are trending down 163 >> 138.  Will continue current rate and recheck HL/aPTT/CBC in am  10/16 0219 aPTT 128, HL 0.82 (correlates), SUPRAtherapeutic.  CBC worse.  Will hold heparin x  1 hour then resume at 1200 units/hr and recheck HL in 6 hours.  Will adjust w/ heparin level since now correlates w/ aPTT.  D/W nurse re: plan.  Ena Dawley, PharmD 08/19/2020,5:15 AM

## 2020-08-19 NOTE — Progress Notes (Signed)
PROGRESS NOTE    Courtney Anderson  GBT:[id number] DOB: [birth year] DOA: 07/24/2020 PCP: Courtney Maple, MD    Brief Narrative:  Courtney Anderson an 64 y.o.female.  [id number] is a 64 year old female with history noted below, who was admitted to [facility] on 2020-07-24 brought in via EMS as a "code sepsis". Patient subsequently tested positive for COVID-19. She presented with oxygen saturations of 69% on room air and had to be placed on nonrebreather mask to achieve oxygen saturations in the 90s. She was subsequently admitted to the Covid unit for management of COVID-19 pneumonia. She was noted to have acute kidney injury she was treated with remdesivir, zinc, vitamin C and antitussives. She was placed empirically on Rocephin and azithromycin. She continued to have increased FiO2 requirements. She was refusing to self prone. She had atrial fibrillation and was seen by cardiologyto assist in management.   CC: f/u respiratory failure  07/31/20- patient is on 90% FiO2, will modify meds as patient is on metoprolol and levophed as well as cardizem. Will d/c propofol to improve SBP and remove arythmogenic vasopressor if able. Plan to have CVP trending and diuresis today. Goals of care discussion today - patient made DNR per sister and son of patient. I had 2 separate phone meetings with son and separate conversation with sister today to explain patient is worse with shock and renal failure requiring multiple pressors.   08/01/20- patient remains critically ill. FiO2 has been weaned down to 80% today.  08/03/20- patient remains critically ill shes being proned, weaning FiO2 as able.  08/04/20-patient with no interval changes continue with weaning and proning protocol. Continue weaning FiO2 and PEEP as to optimize for SBT 08/06/20- patient is weaned down 40%FiO2 today, will attempt awakening trial with SBT, I spoke to son [name] today, he appreciates update and does not want Elink video view  at this time.  08/07/20- patient is requiring 50%FiO2 today 08/08/20- Continuing to wean vent. FiO2 is 28%, will trial SBT today 08/09/20-tachypneic and hypertensive with SBT. Asynchronous.FiO2 back up to 50% 10/7 severeresp failure, attempt SAT/SBT 10/7 extubated to biPAP 10/8 Tachycardic , more confused,patient re-intubated ETT 8.0 10/9 remains on vent 10/11 severe resp failure on vent 10/12 failed weaning trial 10/12 successfully extubated 10/14 TRANSFER TO TRH 10/15 somnolent, sleepy , not interactive speech and swallow passed, but had aspiration event. At risk for aspiration .  10/16- mentation better today, down to 4L. Son at bedside  Consultants:   Pccm, cardiology  Procedures:  Antimicrobials:      Subjective: Patient improved.  Interactive.  Has no complaints.  Son at bedside  Objective: Vitals:   08/19/20 1500 08/19/20 1600 08/19/20 1700 08/19/20 1800  BP: 124/75 125/71 126/81 135/72  Pulse: 69 77 71 74  Resp: (!) 25 20 (!) 21 (!) 26  Temp:  (!) 96.9 F (36.1 C)    TempSrc:  Axillary    SpO2: 100% 95% 100% 100%  Weight:      Height:        Intake/Output Summary (Last 24 hours) at 08/19/2020 1811 Last data filed at 08/19/2020 1800 Gross per 24 hour  Intake 2479.42 ml  Output 2950 ml  Net -470.58 ml   Filed Weights   08/05/20 0500 08/06/20 0412 08/12/20 0530  Weight: 108.4 kg 108.4 kg 109.9 kg    Examination: Awake, interactive, responds to questions today Coarse breath sounds bilaterally no wheezing Reg-irreg, S1-S2 no murmurs Soft nontender nondistended positive bowel sounds No edema  Mood  and affect appropriate in current setting   Data Reviewed: I have personally reviewed following labs and imaging studies  CBC: Recent Labs  Lab 08/13/20 0405 08/13/20 0405 08/14/20 0413 08/14/20 0413 08/15/20 0515 08/15/20 0515 08/16/20 3474 08/17/20 0509 08/18/20 0441 08/19/20 0219 08/19/20 1231  WBC 6.4   < > 7.1   < > 7.2   < > 6.6 6.4 10.1  6.8 5.9  NEUTROABS 4.7  --  5.8  --  5.1  --  4.6  --   --   --   --   HGB 10.5*   < > 10.8*   < > 10.9*   < > 11.4* 11.0* 11.8* 10.7* 11.3*  HCT 34.3*   < > 35.8*   < > 34.9*   < > 35.9* 33.9* 36.6 34.0* 35.8*  MCV 90.7   < > 91.1   < > 89.5   < > 87.3 86.7 84.5 86.1 86.1  PLT 144*   < > 158   < > 155   < > 167 163 138* 111* 110*   < > = values in this interval not displayed.   Basic Metabolic Panel: Recent Labs  Lab 08/15/20 0515 08/15/20 0515 08/16/20 0456 08/16/20 2222 08/17/20 0509 08/18/20 0441 08/18/20 1829 08/19/20 0219 08/19/20 1231  NA 141   < > 139  --  137 138  --  136 136  K 2.9*   < > 2.8*   < > 3.2* 2.7* 3.2* 3.2* 3.4*  CL 100   < > 97*  --  94* 93*  --  94* 95*  CO2 29   < > 31  --  30 28  --  27 27  GLUCOSE 100*   < > 88  --  90 131*  --  85 77  BUN 12   < > 10  --  8 8  --  10 11  CREATININE 0.97   < > 0.88  --  0.89 0.91  --  1.04* 1.03*  CALCIUM 9.5   < > 9.3  --  9.2 9.6  --  10.0 10.1  MG 1.9   < > 1.8  --  1.9 1.5*  --  1.8 2.3  PHOS 2.5  --  2.1*  --  2.1* 3.3  --  2.9  --    < > = values in this interval not displayed.   GFR: Estimated Creatinine Clearance: 67.2 mL/min (A) (by C-G formula based on SCr of 1.03 mg/dL (H)). Liver Function Tests: Recent Labs  Lab 08/15/20 0515 08/16/20 0456 08/17/20 0509 08/18/20 0441 08/19/20 0219  AST 27 26 28 28 21   ALT 35 29 30 29 26   ALKPHOS 60 58 66 69 64  BILITOT 1.1 1.1 1.4* 1.4* 1.3*  PROT 6.3* 6.0* 6.9 7.1 6.7  ALBUMIN 2.7* 2.5* 2.7* 3.0* 2.6*   No results for input(s): LIPASE, AMYLASE in the last 168 hours. No results for input(s): AMMONIA in the last 168 hours. Coagulation Profile: No results for input(s): INR, PROTIME in the last 168 hours. Cardiac Enzymes: No results for input(s): CKTOTAL, CKMB, CKMBINDEX, TROPONINI in the last 168 hours. BNP (last 3 results) No results for input(s): PROBNP in the last 8760 hours. HbA1C: No results for input(s): HGBA1C in the last 72 hours. CBG: Recent  Labs  Lab 08/19/20 0714 08/19/20 0802 08/19/20 1213 08/19/20 1529 08/19/20 1633  GLUCAP 66* 98 71 69* 105*   Lipid Profile: No results for input(s): CHOL,  HDL, LDLCALC, TRIG, CHOLHDL, LDLDIRECT in the last 72 hours. Thyroid Function Tests: No results for input(s): TSH, T4TOTAL, FREET4, T3FREE, THYROIDAB in the last 72 hours. Anemia Panel: No results for input(s): VITAMINB12, FOLATE, FERRITIN, TIBC, IRON, RETICCTPCT in the last 72 hours. Sepsis Labs: No results for input(s): PROCALCITON, LATICACIDVEN in the last 168 hours.  No results found for this or any previous visit (from the past 240 hour(s)).       Radiology Studies: DG Chest Port 1 View  Result Date: 08/18/2020 CLINICAL DATA:  Aspiration EXAM: PORTABLE CHEST 1 VIEW COMPARISON:  08/11/2020 FINDINGS: Endotracheal tube is no longer present. Unchanged position of left IJ central venous catheter. Bilateral airspace opacities are unchanged. Small pleural effusions. IMPRESSION: Unchanged bilateral airspace opacities. Electronically Signed   By: Ulyses Jarred M.D.   On: 08/18/2020 00:53        Scheduled Meds: . aspirin  81 mg Per Tube Daily  . Chlorhexidine Gluconate Cloth  6 each Topical Daily  . cholecalciferol  2,000 Units Per Tube Daily  . collagenase   Topical Daily  . digoxin  0.25 mg Intravenous Daily  . furosemide  20 mg Intravenous BID  . insulin aspart  0-9 Units Subcutaneous Q4H  . mouth rinse  15 mL Mouth Rinse q12n4p  . multivitamin with minerals  1 tablet Per Tube Daily  . nystatin   Topical TID  . pneumococcal 23 valent vaccine  0.5 mL Intramuscular Tomorrow-1000   Continuous Infusions: . sodium chloride Stopped (08/12/20 1532)  . amiodarone 30 mg/hr (08/19/20 1800)  . ampicillin-sulbactam (UNASYN) IV Stopped (08/19/20 1250)  . famotidine (PEPCID) IV Stopped (08/19/20 1132)  . heparin 1,200 Units/hr (08/19/20 1800)  . potassium chloride 10 mEq (08/19/20 1730)    Assessment & Plan:   Principal  Problem:   Sepsis ([place]) Active Problems:   Hypertension   Morbid obesity ([place])   Hypercalcemia   Hyperthyroidism   AKI (acute kidney injury) ([place])   COVID-19 virus infection   Atrial fibrillation with RVR ([place])   Hypokalemia   Pressure injury of skin   Acute hypoxemic respiratory failure due to COVID-19 pneumonia / ARDScomplicated by renal failure-  s/p extubation and re-intubated for acute aspiration pneumonia Resp . Failure resolving, now TRH will take over. 10/15- pt had aspiration event, somnolent this am. At high risk for aspiration. 10/16-down to 4L now, mentation improving. Will try apple sauce per speech recommendation.  Can tranfer out of icu Continue unasyn     Morbid obesity, possible OSA.  -Complicates medical issues and respiratory status  We will need sleep study eventually as outpatient    AFIB WITH RVR Cardiology following Rate better controlled  On heparin drip, amiodarone drip and IV digoxin  We will need to eventually take p.o. when able to do this     Hypophosphatemia- Replaced and resolved Phosphorus 2.9 today  Hypokalemia K today 3.4, replacing monitor levels    DVT prophylaxis: heparin  Code Status: DNR Family Communication: None at bedside   Status is: Inpatient  Remains inpatient appropriate because:IV treatments appropriate due to intensity of illness or inability to take PO   Dispo: The patient is from: Home              Anticipated d/c is to: SNF              Anticipated d/c date is: 2 days              Patient currently is not medically  stable to d/c. needs transitioning to po medications. Needs pt/ot assessment.            LOS: 26 days   Time spent: 35 min with >50% on coc    Nolberto Hanlon, MD [facility] Pager [phone]  If 7PM-7AM, please contact night-coverage [email or web address] Password [email or web address] 08/19/2020, 6:11 PM

## 2020-08-19 NOTE — Plan of Care (Signed)

## 2020-08-20 DIAGNOSIS — I4891 Unspecified atrial fibrillation: Secondary | ICD-10-CM | POA: Diagnosis not present

## 2020-08-20 DIAGNOSIS — N179 Acute kidney failure, unspecified: Secondary | ICD-10-CM | POA: Diagnosis not present

## 2020-08-20 DIAGNOSIS — A419 Sepsis, unspecified organism: Secondary | ICD-10-CM | POA: Diagnosis not present

## 2020-08-20 DIAGNOSIS — U071 COVID-19: Secondary | ICD-10-CM | POA: Diagnosis not present

## 2020-08-20 LAB — CBC
HCT: 34.5 % — ABNORMAL LOW (ref 36.0–46.0)
Hemoglobin: 10.9 g/dL — ABNORMAL LOW (ref 12.0–15.0)
MCH: 27.1 pg (ref 26.0–34.0)
MCHC: 31.6 g/dL (ref 30.0–36.0)
MCV: 85.8 fL (ref 80.0–100.0)
Platelets: 104 10*3/uL — ABNORMAL LOW (ref 150–400)
RBC: 4.02 MIL/uL (ref 3.87–5.11)
RDW: 16.1 % — ABNORMAL HIGH (ref 11.5–15.5)
WBC: 5.6 10*3/uL (ref 4.0–10.5)
nRBC: 0.4 % — ABNORMAL HIGH (ref 0.0–0.2)

## 2020-08-20 LAB — COMPREHENSIVE METABOLIC PANEL
ALT: 26 U/L (ref 0–44)
AST: 21 U/L (ref 15–41)
Albumin: 2.7 g/dL — ABNORMAL LOW (ref 3.5–5.0)
Alkaline Phosphatase: 62 U/L (ref 38–126)
Anion gap: 11 (ref 5–15)
BUN: 10 mg/dL (ref 8–23)
CO2: 29 mmol/L (ref 22–32)
Calcium: 9.8 mg/dL (ref 8.9–10.3)
Chloride: 96 mmol/L — ABNORMAL LOW (ref 98–111)
Creatinine, Ser: 0.98 mg/dL (ref 0.44–1.00)
GFR, Estimated: >60 mL/min (ref >60)
Glucose, Bld: 201 mg/dL — ABNORMAL HIGH (ref 70–99)
Potassium: 3.5 mmol/L (ref 3.5–5.1)
Sodium: 136 mmol/L (ref 135–145)
Total Bilirubin: 0.9 mg/dL (ref 0.3–1.2)
Total Protein: 6.4 g/dL — ABNORMAL LOW (ref 6.5–8.1)

## 2020-08-20 LAB — HEPARIN LEVEL (UNFRACTIONATED): Heparin Unfractionated: 0.53 IU/mL (ref 0.30–0.70)

## 2020-08-20 LAB — GLUCOSE, CAPILLARY
Glucose-Capillary: 74 mg/dL (ref 70–99)
Glucose-Capillary: 75 mg/dL (ref 70–99)
Glucose-Capillary: 104 mg/dL — ABNORMAL HIGH (ref 70–99)
Glucose-Capillary: 103 mg/dL — ABNORMAL HIGH (ref 70–99)
Glucose-Capillary: 111 mg/dL — ABNORMAL HIGH (ref 70–99)
Glucose-Capillary: 110 mg/dL — ABNORMAL HIGH (ref 70–99)

## 2020-08-20 LAB — PHOSPHORUS: Phosphorus: 2.5 mg/dL (ref 2.5–4.6)

## 2020-08-20 LAB — MAGNESIUM: Magnesium: 1.9 mg/dL (ref 1.7–2.4)

## 2020-08-20 MED ORDER — POTASSIUM CHLORIDE 10 MEQ/100ML IV SOLN
10.0000 meq | INTRAVENOUS | Status: AC
  Administered 2020-08-20 (×2): 10 meq via INTRAVENOUS
  Filled 2020-08-20 (×2): qty 100

## 2020-08-20 MED ORDER — MAGNESIUM SULFATE IN D5W 1-5 GM/100ML-% IV SOLN
1.0000 g | Freq: Once | INTRAVENOUS | Status: AC
  Administered 2020-08-20: 1 g via INTRAVENOUS
  Filled 2020-08-20: qty 100

## 2020-08-20 MED ORDER — DEXTROSE 50 % IV SOLN
25.0000 mL | Freq: Once | INTRAVENOUS | Status: AC
  Administered 2020-08-20: 25 mL via INTRAVENOUS
  Filled 2020-08-20: qty 50

## 2020-08-20 NOTE — Progress Notes (Signed)
Patient Name: Courtney Anderson Date of Encounter: 08/20/2020  Hospital Problem List     Principal Problem:   Sepsis [facility]) Active Problems:   Hypertension   Morbid obesity ([place])   Hypercalcemia   Hyperthyroidism   AKI (acute kidney injury) ([place])   COVID-19 virus infection   Atrial fibrillation with RVR (HCC)   Hypokalemia   Pressure injury of skin    Patient Profile     64 yo with history of paroxysmal afib, covid 19 sepsis/pneumonia now extubated. On amiodarone and digoxin iv due to inability to take po.   Currently still n.p.o. due to not proceeding with swallowing study due to patient refusal.  Still hypokalemic.  Appreciate pharmacy assistance. Subjective   Appears very alert this morning.  Discussed consideration for repeat swallowing study.  At the end of my visit, patient states that she would be compliant with this if needed.  Inpatient Medications    . aspirin  81 mg Per Tube Daily  . Chlorhexidine Gluconate Cloth  6 each Topical Daily  . cholecalciferol  2,000 Units Per Tube Daily  . collagenase   Topical Daily  . digoxin  0.25 mg Intravenous Daily  . furosemide  20 mg Intravenous BID  . insulin aspart  0-9 Units Subcutaneous Q4H  . mouth rinse  15 mL Mouth Rinse q12n4p  . multivitamin with minerals  1 tablet Per Tube Daily  . nystatin   Topical TID  . pneumococcal 23 valent vaccine  0.5 mL Intramuscular Tomorrow-1000    Vital Signs    Vitals:   08/20/20 0547 08/20/20 0548 08/20/20 0554 08/20/20 0700  BP:   (!) 144/72 131/64  Pulse: 66 65 66 68  Resp: (!) 24 (!) 21 (!) 22 20  Temp:   98 F (36.7 C) 97.9 F (36.6 C)  TempSrc:   Axillary Oral  SpO2: 92% 94% 94%   Weight:      Height:        Intake/Output Summary (Last 24 hours) at 08/20/2020 1102 Last data filed at 08/20/2020 0115 Gross per 24 hour  Intake 1442.37 ml  Output 1905 ml  Net -462.63 ml   Filed Weights   08/05/20 0500 08/06/20 0412 08/12/20 0530  Weight: 108.4 kg 108.4 kg 109.9 kg     Physical Exam    GEN: Well nourished, well developed, in no acute distress.  HEENT: normal.  Neck: Supple, no JVD, carotid bruits, or masses. Cardiac: RRR, no murmurs, rubs, or gallops. No clubbing, cyanosis, edema.  Radials/DP/PT 2+ and equal bilaterally.  Respiratory:  Respirations regular and unlabored, clear to auscultation bilaterally. GI: Soft, nontender, nondistended, BS + x 4. MS: no deformity or atrophy. Skin: warm and dry, no rash. Neuro: Somewhat slow to respond but more alert  Labs    CBC Recent Labs    08/19/20 1231 08/20/20 0346  WBC 5.9 5.6  HGB 11.3* 10.9*  HCT 35.8* 34.5*  MCV 86.1 85.8  PLT 110* 591*   Basic Metabolic Panel Recent Labs    08/19/20 0219 08/19/20 0219 08/19/20 1231 08/19/20 1231 08/19/20 2001 08/20/20 0346  NA 136   < > 136  --   --  136  K 3.2*   < > 3.4*   < > 3.5 3.5  CL 94*   < > 95*  --   --  96*  CO2 27   < > 27  --   --  29  GLUCOSE 85   < > 77  --   --  201*  BUN 10   < > 11  --   --  10  CREATININE 1.04*   < > 1.03*  --   --  0.98  CALCIUM 10.0   < > 10.1  --   --  9.8  MG 1.8   < > 2.3  --   --  1.9  PHOS 2.9  --   --   --   --  2.5   < > = values in this interval not displayed.   Liver Function Tests Recent Labs    08/19/20 0219 08/20/20 0346  AST 21 21  ALT 26 26  ALKPHOS 64 62  BILITOT 1.3* 0.9  PROT 6.7 6.4*  ALBUMIN 2.6* 2.7*   No results for input(s): LIPASE, AMYLASE in the last 72 hours. Cardiac Enzymes No results for input(s): CKTOTAL, CKMB, CKMBINDEX, TROPONINI in the last 72 hours. BNP No results for input(s): BNP in the last 72 hours. D-Dimer No results for input(s): DDIMER in the last 72 hours. Hemoglobin A1C No results for input(s): HGBA1C in the last 72 hours. Fasting Lipid Panel No results for input(s): CHOL, HDL, LDLCALC, TRIG, CHOLHDL, LDLDIRECT in the last 72 hours. Thyroid Function Tests No results for input(s): TSH, T4TOTAL, T3FREE, THYROIDAB in the last 72 hours.  Invalid  input(s): FREET3  Telemetry    Normal sinus rhythm  ECG  A. fib with RVR  Radiology    DG Chest 1 View  Result Date: 07/29/2020 CLINICAL DATA:  Initial evaluation for acute tachycardia. History of COVID pneumonia. EXAM: CHEST  1 VIEW COMPARISON:  Prior CTA from 07/28/2020 and radiograph from 07/24/2020. FINDINGS: Transverse heart size stable. Mediastinal silhouette within normal limits. Lungs mildly hypoinflated. Severe diffuse bilateral airspace disease, right greater than left, progressed and worsened as compared to prior radiograph from 07/24/2020. Scattered air bronchograms noted at the lung bases bilaterally. Underlying pulmonary vascular congestion without overt pulmonary edema. No visible pleural effusion. No pneumothorax. Osseous structures unchanged. IMPRESSION: Severe diffuse bilateral airspace disease, right greater than left, progressed and worsened as compared to previous, concerning for worsened multifocal pneumonia. Electronically Signed   By: Jeannine Boga M.D.   On: 07/29/2020 05:47   DG Abd 1 View  Result Date: 08/11/2020 CLINICAL DATA:  OG tube placement EXAM: ABDOMEN - 1 VIEW COMPARISON:  Radiograph 08/01/2020 FINDINGS: Transesophageal tube tip and side port distal to the GE junction, terminating at the level of the gastric antrum/proximal duodenum bulb. Persistent heterogeneous airspace opacities present in the lung bases likely with associated atelectasis and trace effusions. Multiple air-filled loops of small bowel and colon without frank dilatation or high-grade obstructive pattern. No suspicious calcifications. Likely edematous changes in the soft tissues. No acute osseous abnormality. IMPRESSION: 1. Transesophageal tube tip and side port distal to the GE junction, terminating at the level of the gastric antrum/proximal duodenum bulb. 2. Persistent bibasilar heterogeneous airspace opacities likely with atelectasis and trace effusions. Electronically Signed   By: Lovena Le M.D.   On: 08/11/2020 19:22   DG Abd 1 View  Result Date: 08/01/2020 CLINICAL DATA:  NG placement. EXAM: ABDOMEN - 1 VIEW COMPARISON:  Earlier today FINDINGS: Tip and side port of the enteric tube are now located below the diaphragm in the stomach. Similar bowel-gas pattern tear earlier today. IMPRESSION: Tip and side port of the enteric tube below the diaphragm in the stomach. Electronically Signed   By: Keith Rake M.D.   On: 08/01/2020 21:56   DG Abd 1  View  Result Date: 08/01/2020 CLINICAL DATA:  OG tube placement EXAM: ABDOMEN - 1 VIEW COMPARISON:  07/31/2020 FINDINGS: Esophageal tube tip in the region of GE junction, side-port the level of distal esophagus. Upper abdominal gas pattern is unobstructed. Airspace disease at both bases. IMPRESSION: Esophageal tube tip overlies the GE junction, side-port overlies distal esophagus, consider further advancement by at least 10 cm for more optimal positioning. These results will be called to the ordering clinician or representative by the Radiologist Assistant, and communication documented in the PACS or [employer]. Electronically Signed   By: Donavan Foil M.D.   On: 08/01/2020 20:22   DG Abd 1 View  Result Date: 07/31/2020 CLINICAL DATA:  Orogastric tube placement EXAM: ABDOMEN - 1 VIEW COMPARISON:  July 30, 2020 FINDINGS: Orogastric tube tip is at the gastroesophageal junction. No bowel dilatation or air-fluid level to suggest bowel obstruction. No free air. IMPRESSION: Orogastric tube tip at gastroesophageal junction. Advise advancing orogastric tube 8-10 cm. No bowel obstruction or free air evident. Electronically Signed   By: Lowella Grip III M.D.   On: 07/31/2020 12:15   DG Abd 1 View  Result Date: 07/30/2020 CLINICAL DATA:  Evaluate enteric tube. EXAM: ABDOMEN - 1 VIEW COMPARISON:  Earlier same day FINDINGS: Enteric tube tip and side port projects over the central upper abdomen. Nonobstructed bowel gas pattern.  Osseous structures unremarkable. IMPRESSION: Interval advancement of the enteric tube with the tip and side port projecting over the central upper abdomen. Recommend advancing the tube an additional 5 cm. Electronically Signed   By: Lovey Newcomer M.D.   On: 07/30/2020 16:49   DG Abd 1 View  Result Date: 07/30/2020 CLINICAL DATA:  COVID positive patient.  OG tube evaluation EXAM: ABDOMEN - 1 VIEW COMPARISON:  None. FINDINGS: Enteric tube tip and side-port project within the distal esophagus, recommend advancement. Patchy airspace opacities lower lungs bilaterally. IMPRESSION: Enteric tube tip and side-port project within the distal esophagus, recommend advancement. These results will be called to the ordering clinician or representative by the Radiologist Assistant, and communication documented in the PACS or [employer]. Electronically Signed   By: Lovey Newcomer M.D.   On: 07/30/2020 13:11   CT ANGIO CHEST PE W OR WO CONTRAST  Result Date: 07/28/2020 CLINICAL DATA:  Respiratory failure. COVID positive. Rule out pulmonary embolism. EXAM: CT ANGIOGRAPHY CHEST WITH CONTRAST TECHNIQUE: Multidetector CT imaging of the chest was performed using the standard protocol during bolus administration of intravenous contrast. Multiplanar CT image reconstructions and MIPs were obtained to evaluate the vascular anatomy. CONTRAST:  112mL OMNIPAQUE IOHEXOL 350 MG/ML SOLN COMPARISON:  Chest 07/24/2020 FINDINGS: Cardiovascular: Excellent pulmonary artery opacification. Negative for pulmonary embolism. Moderate pulmonary artery enlargement compatible with pulmonary artery hypertension. Main pulmonary artery 33 mm in diameter. Cardiac enlargement. No pericardial effusion. Thoracic aorta normal in caliber. No significant opacification of the aorta. Mediastinum/Nodes: Negative for mediastinal mass or adenopathy. Lungs/Pleura: Severe diffuse bilateral airspace disease most prominent in the bases. No pleural effusion. Upper Abdomen:  Negative Musculoskeletal: No acute skeletal abnormality. Review of the MIP images confirms the above findings. IMPRESSION: Negative for pulmonary embolism.  Pulmonary artery hypertension. Severe bilateral airspace disease most prominent the bases compatible with COVID pneumonia. Electronically Signed   By: Franchot Gallo M.D.   On: 07/28/2020 12:21   US RENAL  Result Date: 07/25/2020 CLINICAL DATA:  Acute renal failure. EXAM: RENAL / URINARY TRACT ULTRASOUND COMPLETE COMPARISON:  None. FINDINGS: Right Kidney: Renal measurements: 12.0 x 4.5  x 3.9 cm = volume: 110 mL. Echogenicity within normal limits. No mass or hydronephrosis visualized. Left Kidney: Renal measurements: 12.7 x 5.6 x 4.4 cm = volume: 162 mL. Echogenicity within normal limits. No mass or hydronephrosis visualized. Bladder: Appears normal for degree of bladder distention. Other: None. IMPRESSION: Normal size kidneys without hydronephrosis. Electronically Signed   By: Marin Olp M.D.   On: 07/25/2020 16:40   DG Chest Port 1 View  Result Date: 08/18/2020 CLINICAL DATA:  Aspiration EXAM: PORTABLE CHEST 1 VIEW COMPARISON:  08/11/2020 FINDINGS: Endotracheal tube is no longer present. Unchanged position of left IJ central venous catheter. Bilateral airspace opacities are unchanged. Small pleural effusions. IMPRESSION: Unchanged bilateral airspace opacities. Electronically Signed   By: Ulyses Jarred M.D.   On: 08/18/2020 00:53   DG Chest Port 1 View  Result Date: 08/11/2020 CLINICAL DATA:  64 year old female with respiratory failure. EXAM: PORTABLE CHEST 1 VIEW COMPARISON:  Chest radiograph dated 08/20/2020. FINDINGS: Endotracheal tube with tip in the proximal right mainstem bronchus. Recommend retraction by approximately 4 cm. Left IJ central venous line in similar position. Bilateral pulmonary densities, right greater left, appears similar to prior radiograph. Small bilateral pleural effusions may be present. No pneumothorax. Stable cardiac  silhouette. No acute osseous pathology. IMPRESSION: 1. Endotracheal tube with tip in the proximal right mainstem bronchus. Recommend retraction by approximately 4 cm. 2. No significant interval change in bilateral pulmonary opacities. These results were called by telephone at the time of interpretation on 08/11/2020 at 3:39 pm to provider Adalberto Ill, who verbally acknowledged these results. Electronically Signed   By: Anner Crete M.D.   On: 08/11/2020 15:50   DG Chest Port 1 View  Result Date: 08/10/2020 CLINICAL DATA:  Acute respiratory failure. EXAM: PORTABLE CHEST 1 VIEW COMPARISON:  08/07/2020.  07/31/2020. FINDINGS: Endotracheal tube, NG tube, left IJ line in stable position. Stable cardiomegaly. Multifocal bilateral pulmonary infiltrates are again noted. Small bilateral pleural effusions. No pneumothorax. IMPRESSION: 1. Lines and tubes in stable position. 2. Stable cardiomegaly. 3. Multifocal bilateral pulmonary infiltrates again noted. No interim change. Electronically Signed   By: Marcello Moores  Register   On: 08/10/2020 05:13   DG Chest Port 1 View  Result Date: 08/07/2020 CLINICAL DATA:  COVID-19 pneumonia. EXAM: PORTABLE CHEST 1 VIEW COMPARISON:  Abdominal x-ray dated August 01, 2020. Chest x-ray dated July 31, 2020. FINDINGS: Unchanged endotracheal and enteric tubes. Unchanged left internal jugular central venous catheter. Stable cardiomediastinal silhouette. Poor inspiratory effort compared to the prior study. Peripheral and basilar predominant patchy airspace disease in the right greater than left lungs is not significantly changed. No pneumothorax or pleural effusion. No acute osseous abnormality. IMPRESSION: 1. Unchanged multifocal pneumonia. Electronically Signed   By: Titus Dubin M.D.   On: 08/07/2020 10:00   DG Chest Port 1 View  Result Date: 07/31/2020 CLINICAL DATA:  Orogastric tube placement EXAM: PORTABLE CHEST 1 VIEW COMPARISON:  July 30, 2020. FINDINGS:  Endotracheal tube tip is 6.0 cm above carina. Orogastric tube tip is at the gastroesophageal junction. Left jugular catheter tip is in the superior vena cava. No pneumothorax. Patchy airspace opacity is present bilaterally, primarily in the periphery of the upper and lower lung regions, essentially stable. Heart is upper normal in size with pulmonary vascularity normal. No adenopathy. No bone lesions. IMPRESSION: Tube and catheter positions as described without pneumothorax. Note that the orogastric tube tip is at the gastroesophageal junction. Advise advancing orogastric tube 8-10 cm. Patchy airspace opacity bilaterally, stable. Stable cardiac silhouette.  Electronically Signed   By: Lowella Grip III M.D.   On: 07/31/2020 12:14   DG Chest Port 1 View  Result Date: 07/30/2020 CLINICAL DATA:  Worsening COVID pneumonia.  Intubation. EXAM: PORTABLE CHEST 1 VIEW COMPARISON:  07/29/2020 FINDINGS: The endotracheal tube is 4.3 cm above the carina. The left IJ catheter tip is in the distal SVC near the cavoatrial junction. The NG tube is coursing down the esophagus and into the stomach. The tip is in the fundal region and the proximal port is near the GE junction. This could be advanced several cm. Persistent diffuse interstitial and airspace process in the lungs but slight improved aeration after intubation. No pneumothorax. IMPRESSION: 1. Persistent infiltrates but slight improved aeration after intubation. 2. ET tube and left IJ catheter in good position. 3. NG tube tip in the fundal region and proximal port is near the GE junction. This could be advanced several cm. Electronically Signed   By: Marijo Sanes M.D.   On: 07/30/2020 13:11   DG Chest Port 1 View  Result Date: 07/24/2020 CLINICAL DATA:  Sepsis evaluation. EXAM: PORTABLE CHEST 1 VIEW COMPARISON:  07/25/2010 FINDINGS: Opacities throughout the lateral aspect of the right hemithorax. Evidence for air bronchograms at the medial right lung base.  Slightly prominent left lung interstitial markings. Heart size is within normal limits for technique. Trachea is midline. IMPRESSION: Densities at the right lung base and along the lateral aspect of the right hemithorax. Findings likely represent a combination of pleural and parenchymal disease in the right chest. Findings could be better characterized with a chest CT with IV contrast. Electronically Signed   By: Markus Daft M.D.   On: 07/24/2020 09:32   ECHOCARDIOGRAM COMPLETE  Result Date: 07/26/2020    ECHOCARDIOGRAM REPORT   Patient Name:   MAISHA BOGEN Date of Exam: 07/25/2020 Medical Rec #:  [id number]      Height:       65.0 in Accession #:    [id number]     Weight:       175.0 lb Date of Birth:  1956-09-04      BSA:          1.869 m Patient Age:    73 years       BP:           86/57 mmHg Patient Gender: F              HR:           105 bpm. Exam Location:  [facility] Procedure: 2D Echo, Cardiac Doppler and Color Doppler Indications:     Dyspnea 786.09  History:         Patient has no prior history of Echocardiogram examinations.                  Risk Factors:Hypertension and Dyslipidemia.  Sonographer:     Sherrie Sport RDCS (AE) Referring Phys:  [id number] DWAYNE D CALLWOOD Diagnosing Phys: Yolonda Kida MD  Sonographer Comments: Technically difficult study due to poor echo windows and no apical window. Bandage in left apical space- no apical attempted. IMPRESSIONS  1. Techically difficult study.  2. Left ventricular ejection fraction, by estimation, is 45 to 50%. The left ventricle has mildly decreased function. The left ventricle demonstrates global hypokinesis. Left ventricular diastolic parameters are indeterminate.  3. Right ventricular systolic function is normal. The right ventricular size is normal.  4. The mitral valve is normal in structure. No evidence of mitral  valve regurgitation.  5. The aortic valve is normal in structure. Aortic valve regurgitation is not visualized.  Conclusion(s)/Recommendation(s): Poor windows for evaluation of left ventricular function by transthoracic echocardiography. Would recommend an alternative means of evaluation. FINDINGS  Left Ventricle: Left ventricular ejection fraction, by estimation, is 45 to 50%. The left ventricle has mildly decreased function. The left ventricle demonstrates global hypokinesis. The left ventricular internal cavity size was normal in size. There is  no left ventricular hypertrophy. Left ventricular diastolic parameters are indeterminate. Right Ventricle: The right ventricular size is normal. No increase in right ventricular wall thickness. Right ventricular systolic function is normal. Left Atrium: Left atrial size was normal in size. Right Atrium: Right atrial size was normal in size. Pericardium: There is no evidence of pericardial effusion. Mitral Valve: The mitral valve is normal in structure. No evidence of mitral valve regurgitation. Tricuspid Valve: The tricuspid valve is normal in structure. Tricuspid valve regurgitation is not demonstrated. Aortic Valve: The aortic valve is normal in structure. Aortic valve regurgitation is not visualized. Pulmonic Valve: The pulmonic valve was normal in structure. Pulmonic valve regurgitation is not visualized. Aorta: The ascending aorta was not well visualized. IAS/Shunts: No atrial level shunt detected by color flow Doppler. Additional Comments: Techically difficult study.  LEFT VENTRICLE PLAX 2D LVIDd:         3.90 cm LVIDs:         3.47 cm LV PW:         1.44 cm LV IVS:        0.99 cm LVOT diam:     2.20 cm LVOT Area:     3.80 cm  LEFT ATRIUM         Index LA diam:    5.50 cm 2.94 cm/m   AORTA Ao Root diam: 2.90 cm  SHUNTS Systemic Diam: 2.20 cm Dwayne D Callwood MD Electronically signed by Yolonda Kida MD Signature Date/Time: 07/26/2020/5:48:12 PM    Final     Assessment & Plan    Principal Problem: Sepsis ([place]) Active Problems: Hypertension Morbid obesity  ([place]) Hypercalcemia Hyperthyroidism AKI (acute kidney injury) ([place]) COVID-19 virus infection Atrial fibrillation with RVR (HCC) Hypokalemia   1.Paroxysmalatrial fibrillation with rapid ventricular rate, exacerbated by probable aspiration pneumonia and recurrent respiratory failure with intubation, on amiodarone drip,modestlyimproved after addition of IV digoxin,now reextubated,Eliquis held until patient can safely swallow,on heparin drip. She converted to sinus rhythm at a rate of 90 bpm.Will continue with iv amiodarone, iv digoxin and iv heparin until taking po. Patient states that she will be compliant with a swallowing study if ordered.  2.COVID-19 pneumonia/sepsis, extubated, followed by vomiting and probable aspiration pneumoniawith recurrent respiratory failure requiring reintubation,now extubated and appears to be doing well.  Appears to agree to a swallowing study.  Would switch back to p.o. meds when safe. Recommendations  1.Agree with current therapy 2.Continue amiodarone infusion,transition to200 mgp.o.twice dailywhenable to swallow safely 3.Continue dig 0.25 mg IV daily,transition to0.25 gp.o.dailywhenable to swallow safely 4.ResumeEliquis 5 mg twice daily when able to swallow safely  Signed, Javier Docker. Jovahn Breit MD 08/20/2020, 11:02 AM  Pager: [phone]

## 2020-08-20 NOTE — Progress Notes (Signed)
[facility] for Heparin (switch from Eliquis) Indication: atrial fibrillation  Allergies  Allergen Reactions  . Codeine    Patient Measurements: Height: 5\' 5"  (165.1 cm) Weight: 109.9 kg (242 lb 4.6 oz) IBW/kg (Calculated) : 57 HEPARIN DW (KG): 73.7  Vital Signs: Temp: 98.1 F (36.7 C) (10/17 0215) Temp Source: Axillary (10/17 0215) BP: 134/70 (10/17 0215) Pulse Rate: 63 (10/17 0300)  Labs: Recent Labs    08/17/20 0509 08/17/20 0509 08/18/20 0441 08/18/20 0441 08/19/20 0219 08/19/20 0219 08/19/20 1231 08/19/20 2001 08/20/20 0346  HGB 11.0*   < > 11.8*   < > 10.7*   < > 11.3*  --  10.9*  HCT 33.9*   < > 36.6   < > 34.0*  --  35.8*  --  34.5*  PLT 163   < > 138*   < > 111*  --  110*  --  104*  APTT 87*  --  73*  --  128*  --   --   --   --   HEPARINUNFRC  --   --  0.98*   < > 0.82*   < > 0.51 0.53 0.53  CREATININE 0.89   < > 0.91   < > 1.04*  --  1.03*  --  0.98   < > = values in this interval not displayed.   Estimated Creatinine Clearance: 70.7 mL/min (by C-G formula based on SCr of 0.98 mg/dL).  Medical History: Past Medical History:  Diagnosis Date  . Allergy   . Elevated transaminase level   . Hyperlipidemia   . Hypertension   . Obesity    Assessment: Switching from Eliquis to IV Heparin as pt is now NPO.  Will use aPTT to adjust Heparin infusion. Last Eliquis 5mg  dose was 10/12 at 0900  Heparin was scheduled to be discontinued ~18:00 this afternoon and patient was to resume apixaban, however patient was too lethargic to swallow so heparin was continued.   10/13 2222 APTT 87 therapeutic x 1. Continue heparin drip at 1250 units/hr. Recheck APTT in am to confirm. CBC daily.  10/14 0509 aPTT 87, therapeutic x 2.  CBC stable.  Continue current rate and recheck aPTT and CBC in am.  10/15 0441 aPTT 73, therapeutic x 3.  HL 0.98 (not correlating).  H/H stable, PLTs are trending down 163 >> 138.  Will continue current rate  and recheck HL/aPTT/CBC in am  10/16 0219 aPTT 128, HL 0.82 (correlates), SUPRAtherapeutic.  CBC worse.  Will hold heparin x 1 hour then resume at 1200 units/hr and recheck HL in 6 hours.  10/16 1231 HL 0.51 therapeutic.  Will continue Heparin at 1200 units/hr and check confirmatory HL in 6 hours.   10/17 0346 HL 0.53, therapeutic.  CBC slightly worse.  Continue current rate  Goal of Therapy:  APTT 66-102 HL 0.3 - 0.7 Monitor platelets by anticoagulation protocol: Yes   Plan:  10/17  HL 0.53 therapeutic x3.  Will continue Heparin at 1200 units/hr and check  HL and CBC daily.   Ena Dawley, PharmD 08/20/2020,4:56 AM

## 2020-08-20 NOTE — Progress Notes (Signed)
PROGRESS NOTE    Courtney Anderson  NWG:[id number] DOB: 06-20-1956 DOA: 07/24/2020 PCP: Guadalupe Maple, MD    Brief Narrative:  Courtney Anderson an 64 y.o.female.  VHQ:IONGEXB is a 64 year old female with history noted below, who was admitted to [facility] on 2020-07-24 brought in via EMS as a "code sepsis". Patient subsequently tested positive for COVID-19. She presented with oxygen saturations of 69% on room air and had to be placed on nonrebreather mask to achieve oxygen saturations in the 90s. She was subsequently admitted to the Covid unit for management of COVID-19 pneumonia. She was noted to have acute kidney injury she was treated with remdesivir, zinc, vitamin C and antitussives. She was placed empirically on Rocephin and azithromycin. She continued to have increased FiO2 requirements. She was refusing to self prone. She had atrial fibrillation and was seen by cardiologyto assist in management.   CC: f/u respiratory failure  07/31/20- patient is on 90% FiO2, will modify meds as patient is on metoprolol and levophed as well as cardizem. Will d/c propofol to improve SBP and remove arythmogenic vasopressor if able. Plan to have CVP trending and diuresis today. Goals of care discussion today - patient made DNR per sister and son of patient. I had 2 separate phone meetings with son and separate conversation with sister today to explain patient is worse with shock and renal failure requiring multiple pressors.   08/01/20- patient remains critically ill. FiO2 has been weaned down to 80% today.  08/03/20- patient remains critically ill shes being proned, weaning FiO2 as able.  08/04/20-patient with no interval changes continue with weaning and proning protocol. Continue weaning FiO2 and PEEP as to optimize for SBT 08/06/20- patient is weaned down 40%FiO2 today, will attempt awakening trial with SBT, I spoke to son [name] today, he appreciates update and does not want Elink video view  at this time.  08/07/20- patient is requiring 50%FiO2 today 08/08/20- Continuing to wean vent. FiO2 is 28%, will trial SBT today 08/09/20-tachypneic and hypertensive with SBT. Asynchronous.FiO2 back up to 50% 10/7 severeresp failure, attempt SAT/SBT 10/7 extubated to biPAP 10/8 Tachycardic , more confused,patient re-intubated ETT 8.0 10/9 remains on vent 10/11 severe resp failure on vent 10/12 failed weaning trial 10/12 successfully extubated 10/14 TRANSFER TO TRH 10/15 somnolent, sleepy , not interactive speech and swallow passed, but had aspiration event. At risk for aspiration .  10/16- mentation better today, down to 4L. Son at bedside 10/17- pt in foul mood, upset why she is here. Thinks she has been here for 160 days".   Consultants:   Pccm, cardiology  Procedures:  Antimicrobials:      Subjective: Has no complaints but refuses to answer my neuro exam and upset at being here  Objective: Vitals:   08/20/20 0548 08/20/20 0554 08/20/20 0700 08/20/20 1300  BP:  (!) 144/72 131/64 (!) 155/88  Pulse: 65 66 68 85  Resp: (!) 21 (!) 22 20 (!) 21  Temp:  98 F (36.7 C) 97.9 F (36.6 C) 98.4 F (36.9 C)  TempSrc:  Axillary Oral Oral  SpO2: 94% 94%  92%  Weight:      Height:        Intake/Output Summary (Last 24 hours) at 08/20/2020 1452 Last data filed at 08/20/2020 0115 Gross per 24 hour  Intake 902.69 ml  Output 1295 ml  Net -392.31 ml   Filed Weights   08/05/20 0500 08/06/20 0412 08/12/20 0530  Weight: 108.4 kg 108.4 kg 109.9 kg  Examination: Awake, in foul mood Coarse breath sounds, no wheezing  irreg-reg, awake, interactive, responds to questions today Coarse breath sounds bilaterally no wheezing Regular S1-S2 no murmurs' Obese, nt/nd +bs  No edema Refuses to answer my orient questions Mood and affect is foul and bit aggressive this am   Data Reviewed: I have personally reviewed following labs and imaging studies  CBC: Recent Labs  Lab  08/14/20 0413 08/14/20 0413 08/15/20 0515 08/15/20 0515 08/16/20 6378 08/16/20 5885 08/17/20 0509 08/18/20 0441 08/19/20 0219 08/19/20 1231 08/20/20 0346  WBC 7.1   < > 7.2   < > 6.6   < > 6.4 10.1 6.8 5.9 5.6  NEUTROABS 5.8  --  5.1  --  4.6  --   --   --   --   --   --   HGB 10.8*   < > 10.9*   < > 11.4*   < > 11.0* 11.8* 10.7* 11.3* 10.9*  HCT 35.8*   < > 34.9*   < > 35.9*   < > 33.9* 36.6 34.0* 35.8* 34.5*  MCV 91.1   < > 89.5   < > 87.3   < > 86.7 84.5 86.1 86.1 85.8  PLT 158   < > 155   < > 167   < > 163 138* 111* 110* 104*   < > = values in this interval not displayed.   Basic Metabolic Panel: Recent Labs  Lab 08/16/20 0456 08/16/20 2222 08/17/20 0509 08/17/20 0509 08/18/20 0441 08/18/20 0441 08/18/20 1829 08/19/20 0219 08/19/20 1231 08/19/20 2001 08/20/20 0346  NA 139  --  137  --  138  --   --  136 136  --  136  K 2.8*   < > 3.2*   < > 2.7*   < > 3.2* 3.2* 3.4* 3.5 3.5  CL 97*  --  94*  --  93*  --   --  94* 95*  --  96*  CO2 31  --  30  --  28  --   --  27 27  --  29  GLUCOSE 88  --  90  --  131*  --   --  85 77  --  201*  BUN 10  --  8  --  8  --   --  10 11  --  10  CREATININE 0.88  --  0.89  --  0.91  --   --  1.04* 1.03*  --  0.98  CALCIUM 9.3  --  9.2  --  9.6  --   --  10.0 10.1  --  9.8  MG 1.8  --  1.9  --  1.5*  --   --  1.8 2.3  --  1.9  PHOS 2.1*  --  2.1*  --  3.3  --   --  2.9  --   --  2.5   < > = values in this interval not displayed.   GFR: Estimated Creatinine Clearance: 70.7 mL/min (by C-G formula based on SCr of 0.98 mg/dL). Liver Function Tests: Recent Labs  Lab 08/16/20 0456 08/17/20 0509 08/18/20 0441 08/19/20 0219 08/20/20 0346  AST 26 28 28 21 21   ALT 29 30 29 26 26   ALKPHOS 58 66 69 64 62  BILITOT 1.1 1.4* 1.4* 1.3* 0.9  PROT 6.0* 6.9 7.1 6.7 6.4*  ALBUMIN 2.5* 2.7* 3.0* 2.6* 2.7*   No results for input(s): LIPASE, AMYLASE in  the last 168 hours. No results for input(s): AMMONIA in the last 168 hours. Coagulation  Profile: No results for input(s): INR, PROTIME in the last 168 hours. Cardiac Enzymes: No results for input(s): CKTOTAL, CKMB, CKMBINDEX, TROPONINI in the last 168 hours. BNP (last 3 results) No results for input(s): PROBNP in the last 8760 hours. HbA1C: No results for input(s): HGBA1C in the last 72 hours. CBG: Recent Labs  Lab 08/19/20 1958 08/19/20 2321 08/20/20 0325 08/20/20 0753 08/20/20 1214  GLUCAP 80 90 74 75 104*   Lipid Profile: No results for input(s): CHOL, HDL, LDLCALC, TRIG, CHOLHDL, LDLDIRECT in the last 72 hours. Thyroid Function Tests: No results for input(s): TSH, T4TOTAL, FREET4, T3FREE, THYROIDAB in the last 72 hours. Anemia Panel: No results for input(s): VITAMINB12, FOLATE, FERRITIN, TIBC, IRON, RETICCTPCT in the last 72 hours. Sepsis Labs: No results for input(s): PROCALCITON, LATICACIDVEN in the last 168 hours.  No results found for this or any previous visit (from the past 240 hour(s)).       Radiology Studies: No results found.      Scheduled Meds: . aspirin  81 mg Per Tube Daily  . Chlorhexidine Gluconate Cloth  6 each Topical Daily  . cholecalciferol  2,000 Units Per Tube Daily  . collagenase   Topical Daily  . digoxin  0.25 mg Intravenous Daily  . furosemide  20 mg Intravenous BID  . insulin aspart  0-9 Units Subcutaneous Q4H  . mouth rinse  15 mL Mouth Rinse q12n4p  . multivitamin with minerals  1 tablet Per Tube Daily  . nystatin   Topical TID  . pneumococcal 23 valent vaccine  0.5 mL Intramuscular Tomorrow-1000   Continuous Infusions: . sodium chloride 10 mL/hr at 08/20/20 0951  . amiodarone 30 mg/hr (08/20/20 0550)  . ampicillin-sulbactam (UNASYN) IV 3 g (08/20/20 1425)  . famotidine (PEPCID) IV 20 mg (08/20/20 0934)  . heparin 1,200 Units/hr (08/20/20 0000)  . potassium chloride      Assessment & Plan:   Principal Problem:   Sepsis ([place]) Active Problems:   Hypertension   Morbid obesity ([place])   Hypercalcemia    Hyperthyroidism   AKI (acute kidney injury) ([place])   COVID-19 virus infection   Atrial fibrillation with RVR (HCC)   Hypokalemia   Pressure injury of skin   Acute hypoxemic respiratory failure due to COVID-19 pneumonia / ARDScomplicated by renal failure-  s/p extubation and re-intubated for acute aspiration pneumonia Transferred to medical floor Still on 4 L [place], will try to wean off slowly as tolerated. At aspiration risk Continue on Unasyn      Morbid obesity, possible OSA.  -Because medical issues and respiratory status  Will need sleep study as outpatient eventually      AFIB WITH RVR-now in sinus rhythm  cardiology following  Since still n.p.o. do needs a swallowing eval of prior to see if we can switch her from IV to p.o. medications we will continue on IV cardiac meds  Continue heparin drip, amiodarone drip, and IV digoxin      Hypophosphatemia- Replaced and resolved Level 2.5 today   Hypokalemia Resolved, k 3.5  Nutrition Patient had aspiration risk currently n.p.o. Was not compliant with working with SLP yesterday.  SLP recommended NSG trial with plain applesauce to assess safety Today per cardiology Dr. Ubaldo Glassing told me patient is agreeable to swallowing eval, I have sent chat to SLP to reevaluate her so we can decide on feeding and meds.     DVT  prophylaxis: heparin  Code Status: DNR Family Communication: None at bedside   Status is: Inpatient  Remains inpatient appropriate because:IV treatments appropriate due to intensity of illness or inability to take PO   Dispo: The patient is from: Home              Anticipated d/c is to: SNF              Anticipated d/c date is: 2 days              Patient currently is not medically stable to d/c. needs transitioning to po medications. Needs pt/ot assessment.            LOS: 27 days   Time spent: 35 min with >50% on coc    Nolberto Hanlon, MD [facility] Pager [phone]  If 7PM-7AM,  please contact night-coverage [email or web address] Password [email or web address] 08/20/2020, 2:52 PM

## 2020-08-20 NOTE — Progress Notes (Signed)
[place] for Electrolyte Monitoring and Replacement   Recent Labs: Potassium (mmol/L)  Date Value  08/20/2020 3.5   Magnesium (mg/dL)  Date Value  08/20/2020 1.9   Calcium (mg/dL)  Date Value  08/20/2020 9.8   Calcium, Total (PTH) (mg/dL)  Date Value  07/24/2020 10.1   Albumin (g/dL)  Date Value  08/20/2020 2.7 (L)  10/22/2019 4.2   Phosphorus (mg/dL)  Date Value  08/20/2020 2.5   Sodium (mmol/L)  Date Value  08/20/2020 136  10/22/2019 139   Assessment: 64 year old female with PMHx of HTN, HLD who was admitted with COVID-19 PNA, also with A-fib with RVR and severe intertrigo. Patient extubated 10/12. Remains NPO pending speech eval which patient has refused.  Patient continues to receive potassium and magnesium replacement daily likely s/t NPO status. MD started furosemide 20 mg IV BID. Will likely worsen hypokalemia and require increased replacement.  Goal of Therapy:  Potassium 4.0 - 5.1 mmol/L Magnesium 2.0 - 2.4 mg/dL All Other Electrolytes WNL  Plan:  10/17  K 3.5  Mag 1.9  Phos 2.5 Scr 1.03 Patient on digoxin IV, furosemide 20mg  IV BID. NPO-aspiration risk.  Will order KCl 10 mEq IV x 2 runs now due to pt being on furosemide and KCL 66meq IV x 2 at 2000  and Magnesium sulfate 1 gram IV x 1 now  Follow up all electrolytes daily as warranted  Salam Chesterfield A, PharmD 08/20/2020 7:41 AM

## 2020-08-20 NOTE — Plan of Care (Signed)
  Problem: Education: Goal: Knowledge of General Education information will improve Description: Including pain rating scale, medication(s)/side effects and non-pharmacologic comfort measures Outcome: Not Progressing   Problem: Health Behavior/Discharge Planning: Goal: Ability to manage health-related needs will improve Outcome: Not Progressing   

## 2020-08-20 NOTE — NC FL2 (Signed)
[place] LEVEL OF CARE SCREENING TOOL     IDENTIFICATION  Patient Name: Courtney Anderson Birthdate: [birth year] Sex: female Admission Date (Current Location): 07/24/2020  [place] and [place] Number:  Engineering geologist and Address:  [facility], [address], [place], [place] [address]      Provider Number:    Attending Physician Name and Address:  Nolberto Hanlon, MD  Relative Name and Phone Number:  Jerral Bonito) [phone]    Current Level of Care: Hospital Recommended Level of Care: [place] Prior Approval Number:    Date Approved/Denied:   PASRR Number: pending  Discharge Plan: SNF    Current Diagnoses: Patient Active Problem List   Diagnosis Date Noted  . Pressure injury of skin 08/19/2020  . Sepsis ([place]) 07/24/2020  . AKI (acute kidney injury) ([place]) 07/24/2020  . COVID-19 virus infection 07/24/2020  . Atrial fibrillation with RVR ([place]) 07/24/2020  . Hypokalemia 07/24/2020  . Vitamin D deficiency 10/17/2019  . 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 Hypercalcemia 08/20/2018  . Hyperthyroidism 08/20/2018  . Multinodular goiter 08/20/2018  . Tachycardia 08/29/2017  . Hypertension 05/31/2015  . Morbid obesity ([place]) 05/31/2015  . Elevated transaminase level 05/31/2015  . Hyperlipidemia 05/31/2015    Orientation RESPIRATION BLADDER Height & Weight     Self, Place  O2 Incontinent, External catheter Weight: 242 lb 4.6 oz (109.9 kg) Height:  5\' 5"  (165.1 cm)  BEHAVIORAL SYMPTOMS/MOOD NEUROLOGICAL BOWEL NUTRITION STATUS      Incontinent    AMBULATORY STATUS COMMUNICATION OF NEEDS Skin   Extensive Assist Verbally                         Personal Care Assistance Level of Assistance  Bathing, Feeding, Dressing, Total care Bathing Assistance: Limited assistance Feeding assistance: Limited assistance Dressing Assistance: Maximum assistance     Functional Limitations Info              SPECIAL CARE FACTORS FREQUENCY  PT (By licensed PT), OT (By licensed OT)     PT Frequency: 5 x weekly OT Frequency: 5 x weekly            Contractures      Additional Factors Info  Code Status Code Status Info: Full             Current Medications (08/20/2020):  This is the current hospital active medication list Current Facility-Administered Medications  Medication Dose Route Frequency Provider Last Rate Last Admin  . 0.9 %  sodium chloride infusion   Intravenous PRN Flora Lipps, MD 10 mL/hr at 08/20/20 0951 Rate Change at 08/20/20 0951  . acetaminophen (TYLENOL) tablet 650 mg  650 mg Oral Q6H PRN Agbata, Tochukwu, MD   650 mg at 08/09/20 2127   Or  . acetaminophen (TYLENOL) suppository 650 mg  650 mg Rectal Q6H PRN Agbata, Tochukwu, MD      . amiodarone (NEXTERONE PREMIX) 360-4.14 MG/200ML-% (1.8 mg/mL) IV infusion  30 mg/hr Intravenous Continuous Darel Hong D, NP 16.67 mL/hr at 08/20/20 0550 30 mg/hr at 08/20/20 0550  . Ampicillin-Sulbactam (UNASYN) 3 g in sodium chloride 0.9 % 100 mL IVPB  3 g Intravenous Q6H Hall, Scott A, RPH 200 mL/hr at 08/20/20 0643 3 g at 08/20/20 0643  . aspirin chewable tablet 81 mg  81 mg Per Tube Daily Ottie Glazier, MD   81 mg at 08/15/20 0902  . Chlorhexidine Gluconate Cloth 2 %  PADS 6 each  6 each Topical Daily Flora Lipps, MD   6 each at 08/19/20 2157  . cholecalciferol (VITAMIN D3) tablet 2,000 Units  2,000 Units Per Tube Daily Ottie Glazier, MD   2,000 Units at 08/15/20 0902  . collagenase (SANTYL) ointment   Topical Daily Nolberto Hanlon, MD   Given at 08/20/20 1029  . digoxin (LANOXIN) 0.25 MG/ML injection 0.25 mg  0.25 mg Intravenous Daily Ottie Glazier, MD   0.25 mg at 08/20/20 0925  . famotidine (PEPCID) IVPB 20 mg premix  20 mg Intravenous Q12H Jorje Guild, MD 100 mL/hr at 08/20/20 0934 20 mg at 08/20/20 0934  . furosemide (LASIX) injection 20 mg  20 mg Intravenous BID Ottie Glazier, MD   20 mg at 08/20/20 0926   . guaiFENesin-dextromethorphan (ROBITUSSIN DM) 100-10 MG/5ML syrup 10 mL  10 mL Oral Q4H PRN Agbata, Tochukwu, MD      . heparin ADULT infusion 100 units/mL (25000 units/220mL sodium chloride 0.45%)  1,200 Units/hr Intravenous Continuous Hall, Scott A, RPH 12 mL/hr at 08/20/20 0000 1,200 Units/hr at 08/20/20 0000  . insulin aspart (novoLOG) injection 0-9 Units  0-9 Units Subcutaneous Q4H Awilda Bill, NP   1 Units at 08/18/20 0753  . MEDLINE mouth rinse  15 mL Mouth Rinse q12n4p Flora Lipps, MD   15 mL at 08/20/20 1229  . metoprolol tartrate (LOPRESSOR) injection 2.5-5 mg  2.5-5 mg Intravenous Q2H PRN Darel Hong D, NP   5 mg at 08/14/20 1434  . multivitamin with minerals tablet 1 tablet  1 tablet Per Tube Daily Ottie Glazier, MD   1 tablet at 08/15/20 0902  . nystatin (MYCOSTATIN/NYSTOP) topical powder   Topical TID Darliss Cheney, MD   Given at 08/20/20 [phone]  . ondansetron (ZOFRAN) tablet 4 mg  4 mg Oral Q6H PRN Agbata, Tochukwu, MD       Or  . ondansetron (ZOFRAN) injection 4 mg  4 mg Intravenous Q6H PRN Agbata, Tochukwu, MD   4 mg at 08/11/20 1316  . pneumococcal 23 valent vaccine (PNEUMOVAX-23) injection 0.5 mL  0.5 mL Intramuscular Tomorrow-1000 Pahwani, Ravi, MD      . potassium chloride 10 mEq in 100 mL IVPB  10 mEq Intravenous Q1 Hr x 2 Nolberto Hanlon, MD         Discharge Medications: Please see discharge summary for a list of discharge medications.  Relevant Imaging Results:  Relevant Lab Results:   Additional Information SS# [id number]  Meriel Flavors, LCSW

## 2020-08-21 DIAGNOSIS — N179 Acute kidney failure, unspecified: Secondary | ICD-10-CM | POA: Diagnosis not present

## 2020-08-21 DIAGNOSIS — U071 COVID-19: Secondary | ICD-10-CM | POA: Diagnosis not present

## 2020-08-21 DIAGNOSIS — A419 Sepsis, unspecified organism: Secondary | ICD-10-CM | POA: Diagnosis not present

## 2020-08-21 DIAGNOSIS — I4891 Unspecified atrial fibrillation: Secondary | ICD-10-CM | POA: Diagnosis not present

## 2020-08-21 LAB — COMPREHENSIVE METABOLIC PANEL
ALT: 25 U/L (ref 0–44)
AST: 22 U/L (ref 15–41)
Albumin: 3.2 g/dL — ABNORMAL LOW (ref 3.5–5.0)
Alkaline Phosphatase: 66 U/L (ref 38–126)
Anion gap: 16 — ABNORMAL HIGH (ref 5–15)
BUN: 8 mg/dL (ref 8–23)
CO2: 26 mmol/L (ref 22–32)
Calcium: 10.5 mg/dL — ABNORMAL HIGH (ref 8.9–10.3)
Chloride: 95 mmol/L — ABNORMAL LOW (ref 98–111)
Creatinine, Ser: 1.07 mg/dL — ABNORMAL HIGH (ref 0.44–1.00)
GFR, Estimated: 54 mL/min — ABNORMAL LOW (ref >60)
Glucose, Bld: 96 mg/dL (ref 70–99)
Potassium: 3.7 mmol/L (ref 3.5–5.1)
Sodium: 137 mmol/L (ref 135–145)
Total Bilirubin: 1 mg/dL (ref 0.3–1.2)
Total Protein: 7.3 g/dL (ref 6.5–8.1)

## 2020-08-21 LAB — HEPARIN LEVEL (UNFRACTIONATED): Heparin Unfractionated: 0.47 IU/mL (ref 0.30–0.70)

## 2020-08-21 LAB — CBC
HCT: 41.9 % (ref 36.0–46.0)
Hemoglobin: 13.3 g/dL (ref 12.0–15.0)
MCH: 27.4 pg (ref 26.0–34.0)
MCHC: 31.7 g/dL (ref 30.0–36.0)
MCV: 86.4 fL (ref 80.0–100.0)
Platelets: 113 10*3/uL — ABNORMAL LOW (ref 150–400)
RBC: 4.85 MIL/uL (ref 3.87–5.11)
RDW: 16.1 % — ABNORMAL HIGH (ref 11.5–15.5)
WBC: 7.4 10*3/uL (ref 4.0–10.5)
nRBC: 0.4 % — ABNORMAL HIGH (ref 0.0–0.2)

## 2020-08-21 LAB — GLUCOSE, CAPILLARY
Glucose-Capillary: 110 mg/dL — ABNORMAL HIGH (ref 70–99)
Glucose-Capillary: 111 mg/dL — ABNORMAL HIGH (ref 70–99)
Glucose-Capillary: 114 mg/dL — ABNORMAL HIGH (ref 70–99)
Glucose-Capillary: 93 mg/dL (ref 70–99)
Glucose-Capillary: 96 mg/dL (ref 70–99)
Glucose-Capillary: 99 mg/dL (ref 70–99)

## 2020-08-21 LAB — MAGNESIUM: Magnesium: 1.9 mg/dL (ref 1.7–2.4)

## 2020-08-21 LAB — PHOSPHORUS: Phosphorus: 3.1 mg/dL (ref 2.5–4.6)

## 2020-08-21 MED ORDER — ONDANSETRON HCL 4 MG/2ML IJ SOLN: 4.0000 mg | Freq: Four times a day (QID) | INTRAMUSCULAR | Status: DC | PRN

## 2020-08-21 MED ORDER — ACETAMINOPHEN 650 MG RE SUPP: 650.0000 mg | Freq: Four times a day (QID) | RECTAL | Status: DC | PRN

## 2020-08-21 MED ORDER — GUAIFENESIN-DM 100-10 MG/5ML PO SYRP: 10.0000 mL | ORAL_SOLUTION | ORAL | Status: DC | PRN

## 2020-08-21 MED ORDER — ONDANSETRON HCL 4 MG PO TABS: 4.0000 mg | ORAL_TABLET | Freq: Four times a day (QID) | ORAL | Status: DC | PRN

## 2020-08-21 MED ORDER — ACETAMINOPHEN 325 MG PO TABS: 650.0000 mg | ORAL_TABLET | Freq: Four times a day (QID) | ORAL | Status: DC | PRN

## 2020-08-21 NOTE — Progress Notes (Signed)
Occupational Therapy Treatment Patient Details Name: Courtney Anderson MRN: [id number] DOB: 1956-08-19 Today's Date: 08/21/2020    History of present illness Courtney Anderson is a 80yoF PMH: HTN, hypoTSH, who comes to [facility] on 9/20 after 1 weeks of cough, congestion, weakness, intermittent fever chills. Pt admitted with code sepsis, (+) COVID19. Pt having issues with AF/RVR upon arrival. Pt evaluated by PT on 9/23, then transfered to ICU due to decline in respiratory status. New order for PT received 10/13.   OT comments  Pt seen for OT treatment on this date. Upon arrival to room pt awake, lethargic, low and askew in bed. Pt agreeable to tx, with PT and OT co-treating, 2/2 to +2 assist required for pt and therapists' safety. Pt instructed in bed mobility, able to assist with logrolling to change bed pads, Mod A +2 required from therapist. Pt able to assist minimally with donning sock on RLE (MaxA from therapist) and TD for sock donning with LLE. Pt comes to EOB sitting with ModA +2, with O2 sats dropping from 94% to high 70s-low 80s. Pt able to tolerate EOB sitting ~ 5 minutes. Max A +2 required for sit<supine, with therapist doing all BLE lifting. Pt is completely fatigued by end of session, no longer responding to questions by that point. Frequent coughing throughout session. Pt continues to make progress toward goals and will  benefit from skilled OT services to increase strength, endurance, balance, and functional mobility. Will continue to follow POC. Discharge recommendation remains appropriate.    Follow Up Recommendations  SNF    Equipment Recommendations       Recommendations for Other Services      Precautions / Restrictions Precautions Precautions: Fall Precaution Comments: multiple wounds; watch O2 levels Restrictions Weight Bearing Restrictions: No       Mobility Bed Mobility Overal bed mobility: Needs Assistance Bed Mobility: Supine to Sit;Sit to Supine;Rolling Rolling: Max  assist;+2 for physical assistance   Supine to sit: Max assist;+2 for physical assistance Sit to supine: Max assist;+2 for physical assistance   General bed mobility comments: mod assit to support trunk with static sitting at edge of bed. Close supervision for safety  Transfers Overall transfer level: Needs assistance               General transfer comment: standing not attempted today, 2/2 weakness    Balance Overall balance assessment: Needs assistance Sitting-balance support: Feet supported;Bilateral upper extremity supported Sitting balance-Leahy Scale: Fair Sitting balance - Comments: requires min/mod assist for sitting balance                                   ADL either performed or assessed with clinical judgement   ADL Overall ADL's : Needs assistance/impaired                                             Vision Patient Visual Report: No change from baseline     Perception     Praxis      Cognition Arousal/Alertness: Lethargic Behavior During Therapy: Flat affect Overall Cognitive Status: No family/caregiver present to determine baseline cognitive functioning  General Comments: responds to questions inconsistently; extremely lethargic by end of session        Exercises Total Joint Exercises Ankle Circles/Pumps: PROM;Both;10 reps Heel Slides: PROM;Both;10 reps   Shoulder Instructions       General Comments O2 sats dropped into high 70s-low 80s with transfer to EOB sitting    Pertinent Vitals/ Pain       Pain Assessment: No/denies pain  Home Living                                          Prior Functioning/Environment              Frequency  Min 2X/week        Progress Toward Goals  OT Goals(current goals can now be found in the care plan section)     Acute Rehab OT Goals Patient Stated Goal: return to home, feel better OT Goal  Formulation: With patient Time For Goal Achievement: 08/31/20 Potential to Achieve Goals: Good  Plan Discharge plan remains appropriate;Frequency remains appropriate    Co-evaluation    PT/OT/SLP Co-Evaluation/Treatment: Yes Reason for Co-Treatment: To address functional/ADL transfers PT goals addressed during session: Mobility/safety with mobility OT goals addressed during session: ADL's and self-care      AM-PAC OT "6 Clicks" Daily Activity     Outcome Measure   Help from another person eating meals?: A Lot Help from another person taking care of personal grooming?: A Lot Help from another person toileting, which includes using toliet, bedpan, or urinal?: Total Help from another person bathing (including washing, rinsing, drying)?: Total Help from another person to put on and taking off regular upper body clothing?: Total Help from another person to put on and taking off regular lower body clothing?: Total 6 Click Score: 8    End of Session    OT Visit Diagnosis: Muscle weakness (generalized) (M62.81)   Activity Tolerance Patient limited by fatigue;Patient limited by lethargy;Treatment limited secondary to medical complications (Comment)   Patient Left in bed;with bed alarm set;with call bell/phone within reach   Nurse Communication          Time: 9030-0923 OT Time Calculation (min): 20 min  Charges: OT General Charges $OT Visit: 1 Visit OT Treatments $Self Care/Home Management : 8-22 mins   Josiah Lobo, PhD, MS, OTR/L ascom [phone] 08/21/20, 2:33 PM

## 2020-08-21 NOTE — Progress Notes (Signed)
[place] for Heparin (from Eliquis) Indication: atrial fibrillation  Patient Measurements: Height: 5\' 5"  (165.1 cm) Weight: 99.2 kg (218 lb 12.8 oz) IBW/kg (Calculated) : 57 HEPARIN DW (KG): 73.7  Labs: Recent Labs    08/19/20 0219 08/19/20 0219 08/19/20 1231 08/19/20 1231 08/19/20 2001 08/20/20 0346 08/21/20 0637  HGB 10.7*   < > 11.3*   < >  --  10.9* 13.3  HCT 34.0*   < > 35.8*  --   --  34.5* 41.9  PLT 111*   < > 110*  --   --  104* 113*  APTT 128*  --   --   --   --   --   --   HEPARINUNFRC 0.82*   < > 0.51   < > 0.53 0.53 0.47  CREATININE 1.04*   < > 1.03*  --   --  0.98 1.07*   < > = values in this interval not displayed.   Estimated Creatinine Clearance: 61.2 mL/min (A) (by C-G formula based on SCr of 1.07 mg/dL (H)).  Medical History: Past Medical History:  Diagnosis Date  . Allergy   . Elevated transaminase level   . Hyperlipidemia   . Hypertension   . Obesity    Assessment: Patient is a 64 y/o F with medical history including hypertension, hypothyroidism who is admitted with acute respiratory failure secondary to pneumonia. Admission complicated by atrial fibrillation with RVR. Pharmacy has been consulted to initiate heparin infusion for stroke prophylaxis in the setting of atrial fibrillation.   Patient was initially initiated on heparin infusion which was converted to PO anticoagulation with apixaban. Patient was then switched back to heparin infusion due to aspiration events. Last Eliquis 5mg  dose was 10/12 at 0900. SLP saw patient this AM and is recommending medication administration as crushed with puree.   Goal of Therapy:  HL 0.3 - 0.7 Monitor platelets by anticoagulation protocol: Yes   Plan:  --10/18 HL 0.47 therapeutic x 4. Will continue heparin infusion at 1200 units/hr. --Re-check  HL and CBC daily tomorrow AM --Cardiology recommending converting anticoagulation back to apixaban when patient is able to  swallow safely  Courtney Anderson 08/21/2020,7:41 AM

## 2020-08-21 NOTE — Progress Notes (Signed)
Speech Language Pathology Treatment: Dysphagia  Patient Details Name: Courtney Anderson MRN: [id number] DOB: 1956/10/26 Today's Date: 08/21/2020 Time: 9476-5465 SLP Time Calculation (min) (ACUTE ONLY): 40 min  Assessment / Plan / Recommendation Clinical Impression  Pt seen at Bedside today for po trials. She was receptive and agreed to trials of "water". Pt presented w/ ease of distraction, mumbled speech, and refusal of most po's; much encouragement was needed to the trials she did. She continues w/ [place] O2; congested cough at baseline.  Pt presented w/ oral phase decreased attention/dysphagia in light of declined Cognitive status; this impacts her overall awareness/engagement and safety during po taskswhich can increase risk for aspiration, choking. No overt clinical s/s of aspiration or pharyngeal phase dysphagia noted during po trials; risk for aspiration can be reduced when following general aspiration precautions and given feeding assistance. She requires Mod tactile/verbal/ visualcues for orientation to bolus presentation, and encouragement for acceptance of po trials during feeding (she often looked around, shook head "no", and mumbled/muttered speech noted) but w/ Mod+ encouragement, pt responded to straw at lips and took (multiple) sips of thin liquids(water, Ensure) then purees. No immediate, overt clinical s/s of aspiration noted; no decline in vocal quality during few verbalizations, no cough, and no decline in respiratory status during/post trials -- O2 sats 94-95%. Oral phase was adequate for bolus management and oral clearing of the boluses given after she accepted them. She is missing Dentition. She was not agreeable to taking any po's other than few given stating "not now". No unilateral weakness noted. Missing Dentition. Pt participated somewhat w/ oral care prior to po's given, ice chips. Full Feeding Support given; verbal encouragement. D/t pt's declined Cognitive status, her risk for  aspiration, recommend initiation of the dysphagia level 1(puree) w/ Thin liquids w/ aspiration precautions; reduce Distractions during meals and only give po's when pt is calm and participative. Pills Crushed in Puree for safer swallowing. Support w/ feeding at meals -- check for oral clearing during/post intake. NSG updated. ST services will f/u w/ toleration of diet while admitted. Recommend Palliative Care f/u for GOC as she does not want to take significant amount of po's; Cognitive decline. If pt becomes approriate/ready for diet consistency upgrade at next venue of care, ST services can follow w/ pt there for safe diet upgrade. Precautions posted in room. NSG updated.     HPI HPI: Pt is a 64 y.o. female with medical history significant for morbid Obesity, hypertension, hypothyroidism, dyslipidemia who was brought into the ER by EMS as a "CODE SEPSIS".  Patient states that she has not felt well in over a week and complains of a cough, congestion and generalized weakness.  She also had intermittent fever and chills.  She states that she is usually able to care for herself but over the last 1 week has been unable to because she has felt unwell.  In the field she was said to be hypoxic and required a nonrebreather mask, in the ER she was found to have room air pulse oximetry of 69% and remains on a nonrebreather mask which has improved her pulse oximetry to the low 90s.  She was noted to be in rapid atrial fibrillation upon arrival.  She has acute kidney injury 0.97 >> 2.81, lactic acid is elevated at 3.9 and she has leukocytosis with a left shift.  Her calcium level is elevated at 11.4 when corrected for her low albumin.  Chest x-ray showed air bronchograms in the right lower lobe and  her COVID-19 PCR is positive.  Pt was orally intubated 9/26-10/10/2020.  Currently, she is Confused w/ mumbled/mutterd speech; often refusing po's despite encouragement.       SLP Plan  Continue with current plan of care        Recommendations  Diet recommendations: Dysphagia 1 (puree);Thin liquid Liquids provided via: Cup;Straw (monitor) Medication Administration: Crushed with puree (for safer swallowing) Supervision: Staff to assist with self feeding;Full supervision/cueing for compensatory strategies Compensations: Minimize environmental distractions;Slow rate;Small sips/bites;Follow solids with liquid;Lingual sweep for clearance of pocketing Postural Changes and/or Swallow Maneuvers: Seated upright 90 degrees;Upright 30-60 min after meal                General recommendations:  (Dietician f/u) Oral Care Recommendations: Oral care BID;Oral care before and after PO;Staff/trained caregiver to provide oral care Follow up Recommendations: Skilled Nursing facility (TBD) SLP Visit Diagnosis: Dysphagia, oral phase (R13.11) (Cognitive decline) Plan: Continue with current plan of care       GO                  Courtney Anderson, [place], [place] Pathologist Rehab Services [phone] [facility] 08/21/2020, 10:11 AM

## 2020-08-21 NOTE — Care Management Important Message (Signed)
Important Message  Patient Details  Name: Courtney Anderson MRN: [id number] Date of Birth: [birth year]   Medicare Important Message Given:  Yes     Dannette Barbara 08/21/2020, 3:18 PM

## 2020-08-21 NOTE — Progress Notes (Addendum)
° ° ° °  Palliative actively involved in [name] care since admission.  Chart reviewed. Patient has again successfully tolerated extubated and able to transfer to unit. Continues to have poor appetite with high risk for aspiration. SLP note reviewed.   Attempts to contact son to further discuss goals of care. During previous discussions son was adamant he was not interested in DNR/DNI which lead to multiple intubations and was only focusing on his mother surviving under any means necessary.   Unable to reach Mr. Courtney Anderson.     PMT will re-attempt to contact family at a later time/date. Detailed note and recommendations to follow once follow up [place] has been completed.   Thank you for your referral and allowing PMT to assist in [name]'s care.   Alda Lea, AGPCNP-BC Palliative Medicine Team  Phone: [phone] Pager: [phone] Amion: N. Cousar   NO CHARGE

## 2020-08-21 NOTE — Progress Notes (Addendum)
[place] for Electrolyte Monitoring and Replacement   Recent Labs: Potassium (mmol/L)  Date Value  08/21/2020 3.7   Magnesium (mg/dL)  Date Value  08/21/2020 1.9   Calcium (mg/dL)  Date Value  08/21/2020 10.5 (H)   Calcium, Total (PTH) (mg/dL)  Date Value  07/24/2020 10.1   Albumin (g/dL)  Date Value  08/21/2020 3.2 (L)  10/22/2019 4.2   Phosphorus (mg/dL)  Date Value  08/21/2020 3.1   Sodium (mmol/L)  Date Value  08/21/2020 137  10/22/2019 139   Assessment: 64 year old female with PMHx of HTN, HLD who was admitted with COVID-19 PNA, also with A-fib with RVR and severe intertrigo. Patient extubated 10/12.   Patient was NPO secondary to aspiration events. Patient seen by SLP today. Cleared patient to take medications crushed in puree. Dysphagia 1 diet has been ordered. Patient has converted to NSR. Continues on digoxin and amiodarone.  MIVF: None Diuresis: IV Lasix 20 mg BID UOP: 2925 mL yesterday  Goal of Therapy:  Potassium 4.0 - 5.1 mmol/L Magnesium 2.0 - 2.4 mg/dL All Other Electrolytes WNL  Plan:  10/18  K 3.7  Mag 1.9  Phos 3.1 Scr 1.07  Potassium slightly below ideal range. However, patient ordered diet today. Will defer replacement today and see how electrolytes look tomorrow AM.   Benita Gutter 08/21/2020 7:46 AM

## 2020-08-21 NOTE — Progress Notes (Addendum)
PROGRESS NOTE    LYNASIA MELOCHE  GBT:[id number] DOB: [birth year] DOA: 07/24/2020 PCP: Guadalupe Maple, MD    Brief Narrative:  Shanette Tamargo Wattis an 64 y.o.female.  XTG:GYIRSWN is a 64 year old female with history noted below, who was admitted to [facility] on 2020-07-24 brought in via EMS as a "code sepsis". Patient subsequently tested positive for COVID-19. She presented with oxygen saturations of 69% on room air and had to be placed on nonrebreather mask to achieve oxygen saturations in the 90s. She was subsequently admitted to the Covid unit for management of COVID-19 pneumonia. She was noted to have acute kidney injury she was treated with remdesivir, zinc, vitamin C and antitussives. She was placed empirically on Rocephin and azithromycin. She continued to have increased FiO2 requirements. She was refusing to self prone. She had atrial fibrillation and was seen by cardiologyto assist in management.   CC: f/u respiratory failure  07/31/20- patient is on 90% FiO2, will modify meds as patient is on metoprolol and levophed as well as cardizem. Will d/c propofol to improve SBP and remove arythmogenic vasopressor if able. Plan to have CVP trending and diuresis today. Goals of care discussion today - patient made DNR per sister and son of patient. I had 2 separate phone meetings with son and separate conversation with sister today to explain patient is worse with shock and renal failure requiring multiple pressors.   08/01/20- patient remains critically ill. FiO2 has been weaned down to 80% today.  08/03/20- patient remains critically ill shes being proned, weaning FiO2 as able.  08/04/20-patient with no interval changes continue with weaning and proning protocol. Continue weaning FiO2 and PEEP as to optimize for SBT 08/06/20- patient is weaned down 40%FiO2 today, will attempt awakening trial with SBT, I spoke to son [name] today, he appreciates update and does not want Elink video view  at this time.  08/07/20- patient is requiring 50%FiO2 today 08/08/20- Continuing to wean vent. FiO2 is 28%, will trial SBT today 08/09/20-tachypneic and hypertensive with SBT. Asynchronous.FiO2 back up to 50% 10/7 severeresp failure, attempt SAT/SBT 10/7 extubated to biPAP 10/8 Tachycardic , more confused,patient re-intubated ETT 8.0 10/9 remains on vent 10/11 severe resp failure on vent 10/12 failed weaning trial 10/12 successfully extubated 10/14 TRANSFER TO TRH 10/15 somnolent, sleepy , not interactive speech and swallow passed, but had aspiration event. At risk for aspiration .  10/16- mentation better today, down to 4L. Son at bedside 10/17- pt in foul mood, upset why she is here. Thinks she has been here for 160 days".  10/18- awake, calmer today, mentating well.  Refusing meds being crushed with applesauce or ice cream as speech had recommended  Consultants:   Pccm, cardiology  Procedures:  Antimicrobials:      Subjective: Has no complaints. +bm  Objective: Vitals:   08/21/20 0000 08/21/20 0413 08/21/20 0424 08/21/20 0756  BP: (!) 151/86 (!) 146/85  139/77  Pulse: 80 77  79  Resp: 18 19  17   Temp:  97.7 F (36.5 C)  98 F (36.7 C)  TempSrc:  Oral  Oral  SpO2: 99% 95%  90%  Weight:   99.2 kg   Height:        Intake/Output Summary (Last 24 hours) at 08/21/2020 0833 Last data filed at 08/21/2020 0425 Gross per 24 hour  Intake 1142.45 ml  Output 2925 ml  Net -1782.55 ml   Filed Weights   08/06/20 0412 08/12/20 0530 08/21/20 0424  Weight: 108.4 kg 109.9  kg 99.2 kg    Examination: Nad, calm CTA, no wheeze rales rhonchi's RRR S1-S2 no murmurs rubs gallops Soft benign positive bowel sounds No edema Alert oriented x3 Mood and affect appropriate in current setting  Data Reviewed: I have personally reviewed following labs and imaging studies  CBC: Recent Labs  Lab 08/15/20 0515 08/15/20 0515 08/16/20 0616 08/17/20 0509 08/18/20 0441  08/19/20 0219 08/19/20 1231 08/20/20 0346 08/21/20 0637  WBC 7.2   < > 6.6   < > 10.1 6.8 5.9 5.6 7.4  NEUTROABS 5.1  --  4.6  --   --   --   --   --   --   HGB 10.9*   < > 11.4*   < > 11.8* 10.7* 11.3* 10.9* 13.3  HCT 34.9*   < > 35.9*   < > 36.6 34.0* 35.8* 34.5* 41.9  MCV 89.5   < > 87.3   < > 84.5 86.1 86.1 85.8 86.4  PLT 155   < > 167   < > 138* 111* 110* 104* 113*   < > = values in this interval not displayed.   Basic Metabolic Panel: Recent Labs  Lab 08/17/20 0509 08/17/20 0509 08/18/20 0441 08/18/20 1829 08/19/20 0219 08/19/20 1231 08/19/20 2001 08/20/20 0346 08/21/20 0637  NA 137   < > 138  --  136 136  --  136 137  K 3.2*   < > 2.7*   < > 3.2* 3.4* 3.5 3.5 3.7  CL 94*   < > 93*  --  94* 95*  --  96* 95*  CO2 30   < > 28  --  27 27  --  29 26  GLUCOSE 90   < > 131*  --  85 77  --  201* 96  BUN 8   < > 8  --  10 11  --  10 8  CREATININE 0.89   < > 0.91  --  1.04* 1.03*  --  0.98 1.07*  CALCIUM 9.2   < > 9.6  --  10.0 10.1  --  9.8 10.5*  MG 1.9   < > 1.5*  --  1.8 2.3  --  1.9 1.9  PHOS 2.1*  --  3.3  --  2.9  --   --  2.5 3.1   < > = values in this interval not displayed.   GFR: Estimated Creatinine Clearance: 61.2 mL/min (A) (by C-G formula based on SCr of 1.07 mg/dL (H)). Liver Function Tests: Recent Labs  Lab 08/17/20 0509 08/18/20 0441 08/19/20 0219 08/20/20 0346 08/21/20 0637  AST 28 28 21 21 22   ALT 30 29 26 26 25   ALKPHOS 66 69 64 62 66  BILITOT 1.4* 1.4* 1.3* 0.9 1.0  PROT 6.9 7.1 6.7 6.4* 7.3  ALBUMIN 2.7* 3.0* 2.6* 2.7* 3.2*   No results for input(s): LIPASE, AMYLASE in the last 168 hours. No results for input(s): AMMONIA in the last 168 hours. Coagulation Profile: No results for input(s): INR, PROTIME in the last 168 hours. Cardiac Enzymes: No results for input(s): CKTOTAL, CKMB, CKMBINDEX, TROPONINI in the last 168 hours. BNP (last 3 results) No results for input(s): PROBNP in the last 8760 hours. HbA1C: No results for input(s):  HGBA1C in the last 72 hours. CBG: Recent Labs  Lab 08/20/20 1632 08/20/20 2022 08/20/20 2320 08/21/20 0411 08/21/20 0757  GLUCAP 103* 111* 110* 93 96   Lipid Profile: No results for input(s): CHOL, HDL,  LDLCALC, TRIG, CHOLHDL, LDLDIRECT in the last 72 hours. Thyroid Function Tests: No results for input(s): TSH, T4TOTAL, FREET4, T3FREE, THYROIDAB in the last 72 hours. Anemia Panel: No results for input(s): VITAMINB12, FOLATE, FERRITIN, TIBC, IRON, RETICCTPCT in the last 72 hours. Sepsis Labs: No results for input(s): PROCALCITON, LATICACIDVEN in the last 168 hours.  No results found for this or any previous visit (from the past 240 hour(s)).       Radiology Studies: No results found.      Scheduled Meds: . aspirin  81 mg Per Tube Daily  . Chlorhexidine Gluconate Cloth  6 each Topical Daily  . cholecalciferol  2,000 Units Per Tube Daily  . collagenase   Topical Daily  . digoxin  0.25 mg Intravenous Daily  . furosemide  20 mg Intravenous BID  . insulin aspart  0-9 Units Subcutaneous Q4H  . mouth rinse  15 mL Mouth Rinse q12n4p  . multivitamin with minerals  1 tablet Per Tube Daily  . nystatin   Topical TID  . pneumococcal 23 valent vaccine  0.5 mL Intramuscular Tomorrow-1000   Continuous Infusions: . sodium chloride 10 mL/hr at 08/20/20 0951  . amiodarone 30 mg/hr (08/21/20 0639)  . ampicillin-sulbactam (UNASYN) IV Stopped (08/21/20 0256)  . famotidine (PEPCID) IV Stopped (08/21/20 0113)  . heparin 1,200 Units/hr (08/21/20 0300)    Assessment & Plan:   Principal Problem:   Sepsis ([place]) Active Problems:   Hypertension   Morbid obesity ([place])   Hypercalcemia   Hyperthyroidism   AKI (acute kidney injury) ([place])   COVID-19 virus infection   Atrial fibrillation with RVR ([facility])   Hypokalemia   Pressure injury of skin   Acute hypoxemic respiratory failure due to COVID-19 pneumonia / ARDScomplicated by renal failure-  s/p extubation and re-intubated for acute  aspiration pneumonia 02 sat dropped to 87% on [place] At aspiration risk Continue Unasyn D/c lasix as she appears more on dry side.     Morbid obesity, possible OSA.  -Because medical issues and respiratory status  - will need sleep study as outpatient     AFIB WITH RVR-  cardiology following  Remains in sinus rhythm Continue with IV amiodarone until able to take p.o. Continue heparin drip until able to take p.o. Continue IV digoxin       Hypophosphatemia- Replaced and resolved Remained stable, 3.1 today   Hypokalemia Resolved and remained stable  Nutrition Service with NSG today and tomorrow for calorie count  May end up needing PEG tube  Refusing to take meds crushed in applesauce as it was recommended by SLP       DVT prophylaxis: heparin drip Code Status: DNR Family Communication: None at bedside   Status is: Inpatient  Remains inpatient appropriate because:IV treatments appropriate due to intensity of illness or inability to take PO   Dispo: The patient is from: Home              Anticipated d/c is to: family refused SNF, needs HH.               Anticipated d/c date is: 2 days              Patient currently is not medically stable to d/c. needs transitioning to po medications. Needs nutrition, diagnositic w/u pending. May need peg            LOS: 28 days   Time spent: 35 min with >50% on coc    Nolberto Hanlon, MD [facility]  Pager [phone]  If 7PM-7AM, please contact night-coverage [email or web address] Password [facility] 08/21/2020, 8:33 AM

## 2020-08-21 NOTE — Progress Notes (Signed)
Physical Therapy Treatment Patient Details Name: Courtney Anderson MRN: [id number] DOB: 1956/08/23 Today's Date: 08/21/2020    History of Present Illness Courtney Anderson is a 44yoF PMH: HTN, hypoTSH, who comes to [facility] on 9/20 after 1 weeks of cough, congestion, weakness, intermittent fever chills. Pt admitted with code sepsis, (+) COVID19. Pt having issues with AF/RVR upon arrival. Pt evaluated by PT on 9/23, then transfered to ICU due to decline in respiratory status. New order for PT received 10/13.    PT Comments    Patient received in supine, OT present. Patient requirng +2 assist for bed positioning and to perform bed mobility. She is minimally responsive at times, minimally able to assist with bed mobility. Easily fatigued after sitting at edge of bed x several minutes. Requires max +2 assist for all bed mobility at this time. She will continue to benefit from skilled PT while here to improve strength and functional independence.      Follow Up Recommendations  SNF     Equipment Recommendations  None recommended by PT;Other (comment) (TBD)    Recommendations for Other Services       Precautions / Restrictions Precautions Precautions: Fall Precaution Comments: multiple wounds Restrictions Weight Bearing Restrictions: No    Mobility  Bed Mobility Overal bed mobility: Needs Assistance Bed Mobility: Supine to Sit;Sit to Supine;Rolling Rolling: Max assist;+2 for physical assistance   Supine to sit: Max assist;+2 for physical assistance Sit to supine: Max assist;+2 for physical assistance   General bed mobility comments: mod assit to support trunk with static sitting at edge of bed. Close supervision for safety  Transfers                 General transfer comment: unable due to significant weakness  Ambulation/Gait             General Gait Details: unable to walk   Stairs             Wheelchair Mobility    Modified Rankin (Stroke Patients Only)        Balance Overall balance assessment: Needs assistance Sitting-balance support: Feet supported;Bilateral upper extremity supported Sitting balance-Leahy Scale: Fair Sitting balance - Comments: requires min/mod assist for sitting balance                                    Cognition Arousal/Alertness: Lethargic Behavior During Therapy: Flat affect Overall Cognitive Status: No family/caregiver present to determine baseline cognitive functioning                                 General Comments: not consistent with answering/responding.      Exercises Total Joint Exercises Ankle Circles/Pumps: PROM;Both;10 reps Heel Slides: PROM;Both;10 reps    General Comments        Pertinent Vitals/Pain Pain Assessment: No/denies pain    Home Living                      Prior Function            PT Goals (current goals can now be found in the care plan section) Acute Rehab PT Goals Patient Stated Goal: return to home, feel better PT Goal Formulation: With patient Time For Goal Achievement: 08/30/20 Potential to Achieve Goals: Poor Progress towards PT goals: Progressing toward goals    Frequency  Min 2X/week      PT Plan Current plan remains appropriate    Co-evaluation PT/OT/SLP Co-Evaluation/Treatment: Yes Reason for Co-Treatment: For patient/therapist safety;To address functional/ADL transfers PT goals addressed during session: Mobility/safety with mobility;Balance        AM-PAC PT "6 Clicks" Mobility   Outcome Measure  Help needed turning from your back to your side while in a flat bed without using bedrails?: Total Help needed moving from lying on your back to sitting on the side of a flat bed without using bedrails?: Total Help needed moving to and from a bed to a chair (including a wheelchair)?: Total Help needed standing up from a chair using your arms (e.g., wheelchair or bedside chair)?: Total Help needed to walk  in hospital room?: Total Help needed climbing 3-5 steps with a railing? : Total 6 Click Score: 6    End of Session Equipment Utilized During Treatment: Oxygen Activity Tolerance: Patient limited by lethargy;Treatment limited secondary to medical complications (Comment) (O2 satruations drop with activity) Patient left: in bed;with bed alarm set;with call bell/phone within reach Nurse Communication: Mobility status PT Visit Diagnosis: Other abnormalities of gait and mobility (R26.89);Muscle weakness (generalized) (M62.81)     Time: 5110-2111 PT Time Calculation (min) (ACUTE ONLY): 15 min  Charges:  $Therapeutic Activity: 8-22 mins                     Degan Hanser, PT, GCS 08/21/20,1:58 PM

## 2020-08-21 NOTE — TOC Transition Note (Addendum)
Transition of Care [facility] [place]) - CM/SW Discharge Note   Patient Details  Name: KHAMIL LAMICA MRN: [id number] Date of Birth: January 21, 1956  Transition of Care [facility] CM/SW Contact:  Meriel Flavors, LCSW Phone Number: 08/21/2020, 4:45 PM   Clinical Narrative:    CSW spoke with patient and her son, Delmus. They have decided to go home with HH/PT. Patient is declining skilled nursing facility at this time. CSW confirmed refusing SNF and agreed to assist with setting up HH/PT   4:30 pm CSW made referral to [place] with [facility] for HH/PT. Helene Kelp has agreed to provide services when patient is discharged.       Patient Goals and CMS Choice        Discharge Placement                       Discharge Plan and Services                                     Social Determinants of Health (SDOH) Interventions     Readmission Risk Interventions No flowsheet data found.

## 2020-08-21 NOTE — Progress Notes (Signed)
Patient Name: Courtney Anderson Date of Encounter: 08/21/2020  Hospital Problem List     Principal Problem:   Sepsis [facility]) Active Problems:   Hypertension   Morbid obesity ([place])   Hypercalcemia   Hyperthyroidism   AKI (acute kidney injury) ([place])   COVID-19 virus infection   Atrial fibrillation with RVR (HCC)   Hypokalemia   Pressure injury of skin    Patient Profile     64 yo with history of paroxysmal afib, covid 19 sepsis/pneumonia .Marland Kitchen On amiodarone and digoxin iv due to inability to take po.  Currently still n.p.o. due to not proceeding with swallowing study due to patient refusal.  Stated yesterday that she would proceed with swallowing study.   Subjective   No complaints this am. States she will comply with SLP evaluation today.   Inpatient Medications    . aspirin  81 mg Per Tube Daily  . Chlorhexidine Gluconate Cloth  6 each Topical Daily  . cholecalciferol  2,000 Units Per Tube Daily  . collagenase   Topical Daily  . digoxin  0.25 mg Intravenous Daily  . furosemide  20 mg Intravenous BID  . insulin aspart  0-9 Units Subcutaneous Q4H  . mouth rinse  15 mL Mouth Rinse q12n4p  . multivitamin with minerals  1 tablet Per Tube Daily  . nystatin   Topical TID  . pneumococcal 23 valent vaccine  0.5 mL Intramuscular Tomorrow-1000    Vital Signs    Vitals:   08/20/20 2021 08/21/20 0000 08/21/20 0413 08/21/20 0424  BP: 135/80 (!) 151/86 (!) 146/85   Pulse: 82 80 77   Resp: 20 18 19    Temp: 98.6 F (37 C)  97.7 F (36.5 C)   TempSrc: Oral  Oral   SpO2: 97% 99% 95%   Weight:    99.2 kg  Height:        Intake/Output Summary (Last 24 hours) at 08/21/2020 0743 Last data filed at 08/21/2020 0425 Gross per 24 hour  Intake 1142.45 ml  Output 2925 ml  Net -1782.55 ml   Filed Weights   08/06/20 0412 08/12/20 0530 08/21/20 0424  Weight: 108.4 kg 109.9 kg 99.2 kg    Physical Exam    GEN: Well nourished, well developed, in no acute distress.  HEENT: normal.   Neck: Supple, no JVD, carotid bruits, or masses. Cardiac: RRR, no murmurs, rubs, or gallops. No clubbing, cyanosis, edema.  Radials/DP/PT 2+ and equal bilaterally.  Respiratory:  Respirations regular and unlabored, clear to auscultation bilaterally. GI: Soft, nontender, nondistended, BS + x 4. MS: no deformity or atrophy. Skin: warm and dry, no rash. Neuro:  Strength and sensation are intact. Psych: Normal affect.  Labs    CBC Recent Labs    08/20/20 0346 08/21/20 0637  WBC 5.6 7.4  HGB 10.9* 13.3  HCT 34.5* 41.9  MCV 85.8 86.4  PLT 104* 465*   Basic Metabolic Panel Recent Labs    08/20/20 0346 08/21/20 0637  NA 136 137  K 3.5 3.7  CL 96* 95*  CO2 29 26  GLUCOSE 201* 96  BUN 10 8  CREATININE 0.98 1.07*  CALCIUM 9.8 10.5*  MG 1.9 1.9  PHOS 2.5 3.1   Liver Function Tests Recent Labs    08/20/20 0346 08/21/20 0637  AST 21 22  ALT 26 25  ALKPHOS 62 66  BILITOT 0.9 1.0  PROT 6.4* 7.3  ALBUMIN 2.7* 3.2*   No results for input(s): LIPASE, AMYLASE in the last  72 hours. Cardiac Enzymes No results for input(s): CKTOTAL, CKMB, CKMBINDEX, TROPONINI in the last 72 hours. BNP No results for input(s): BNP in the last 72 hours. D-Dimer No results for input(s): DDIMER in the last 72 hours. Hemoglobin A1C No results for input(s): HGBA1C in the last 72 hours. Fasting Lipid Panel No results for input(s): CHOL, HDL, LDLCALC, TRIG, CHOLHDL, LDLDIRECT in the last 72 hours. Thyroid Function Tests No results for input(s): TSH, T4TOTAL, T3FREE, THYROIDAB in the last 72 hours.  Invalid input(s): FREET3  Telemetry    nsr  ECG    nsr  Radiology    DG Chest 1 View  Result Date: 07/29/2020 CLINICAL DATA:  Initial evaluation for acute tachycardia. History of COVID pneumonia. EXAM: CHEST  1 VIEW COMPARISON:  Prior CTA from 07/28/2020 and radiograph from 07/24/2020. FINDINGS: Transverse heart size stable. Mediastinal silhouette within normal limits. Lungs mildly  hypoinflated. Severe diffuse bilateral airspace disease, right greater than left, progressed and worsened as compared to prior radiograph from 07/24/2020. Scattered air bronchograms noted at the lung bases bilaterally. Underlying pulmonary vascular congestion without overt pulmonary edema. No visible pleural effusion. No pneumothorax. Osseous structures unchanged. IMPRESSION: Severe diffuse bilateral airspace disease, right greater than left, progressed and worsened as compared to previous, concerning for worsened multifocal pneumonia. Electronically Signed   By: Jeannine Boga M.D.   On: 07/29/2020 05:47   DG Abd 1 View  Result Date: 08/11/2020 CLINICAL DATA:  OG tube placement EXAM: ABDOMEN - 1 VIEW COMPARISON:  Radiograph 08/01/2020 FINDINGS: Transesophageal tube tip and side port distal to the GE junction, terminating at the level of the gastric antrum/proximal duodenum bulb. Persistent heterogeneous airspace opacities present in the lung bases likely with associated atelectasis and trace effusions. Multiple air-filled loops of small bowel and colon without frank dilatation or high-grade obstructive pattern. No suspicious calcifications. Likely edematous changes in the soft tissues. No acute osseous abnormality. IMPRESSION: 1. Transesophageal tube tip and side port distal to the GE junction, terminating at the level of the gastric antrum/proximal duodenum bulb. 2. Persistent bibasilar heterogeneous airspace opacities likely with atelectasis and trace effusions. Electronically Signed   By: Lovena Le M.D.   On: 08/11/2020 19:22   DG Abd 1 View  Result Date: 08/01/2020 CLINICAL DATA:  NG placement. EXAM: ABDOMEN - 1 VIEW COMPARISON:  Earlier today FINDINGS: Tip and side port of the enteric tube are now located below the diaphragm in the stomach. Similar bowel-gas pattern tear earlier today. IMPRESSION: Tip and side port of the enteric tube below the diaphragm in the stomach. Electronically Signed    By: Keith Rake M.D.   On: 08/01/2020 21:56   DG Abd 1 View  Result Date: 08/01/2020 CLINICAL DATA:  OG tube placement EXAM: ABDOMEN - 1 VIEW COMPARISON:  07/31/2020 FINDINGS: Esophageal tube tip in the region of GE junction, side-port the level of distal esophagus. Upper abdominal gas pattern is unobstructed. Airspace disease at both bases. IMPRESSION: Esophageal tube tip overlies the GE junction, side-port overlies distal esophagus, consider further advancement by at least 10 cm for more optimal positioning. These results will be called to the ordering clinician or representative by the Radiologist Assistant, and communication documented in the PACS or [employer]. Electronically Signed   By: Donavan Foil M.D.   On: 08/01/2020 20:22   DG Abd 1 View  Result Date: 07/31/2020 CLINICAL DATA:  Orogastric tube placement EXAM: ABDOMEN - 1 VIEW COMPARISON:  July 30, 2020 FINDINGS: Orogastric tube tip is at  the gastroesophageal junction. No bowel dilatation or air-fluid level to suggest bowel obstruction. No free air. IMPRESSION: Orogastric tube tip at gastroesophageal junction. Advise advancing orogastric tube 8-10 cm. No bowel obstruction or free air evident. Electronically Signed   By: Lowella Grip III M.D.   On: 07/31/2020 12:15   DG Abd 1 View  Result Date: 07/30/2020 CLINICAL DATA:  Evaluate enteric tube. EXAM: ABDOMEN - 1 VIEW COMPARISON:  Earlier same day FINDINGS: Enteric tube tip and side port projects over the central upper abdomen. Nonobstructed bowel gas pattern. Osseous structures unremarkable. IMPRESSION: Interval advancement of the enteric tube with the tip and side port projecting over the central upper abdomen. Recommend advancing the tube an additional 5 cm. Electronically Signed   By: Lovey Newcomer M.D.   On: 07/30/2020 16:49   DG Abd 1 View  Result Date: 07/30/2020 CLINICAL DATA:  COVID positive patient.  OG tube evaluation EXAM: ABDOMEN - 1 VIEW COMPARISON:  None.  FINDINGS: Enteric tube tip and side-port project within the distal esophagus, recommend advancement. Patchy airspace opacities lower lungs bilaterally. IMPRESSION: Enteric tube tip and side-port project within the distal esophagus, recommend advancement. These results will be called to the ordering clinician or representative by the Radiologist Assistant, and communication documented in the PACS or [employer]. Electronically Signed   By: Lovey Newcomer M.D.   On: 07/30/2020 13:11   CT ANGIO CHEST PE W OR WO CONTRAST  Result Date: 07/28/2020 CLINICAL DATA:  Respiratory failure. COVID positive. Rule out pulmonary embolism. EXAM: CT ANGIOGRAPHY CHEST WITH CONTRAST TECHNIQUE: Multidetector CT imaging of the chest was performed using the standard protocol during bolus administration of intravenous contrast. Multiplanar CT image reconstructions and MIPs were obtained to evaluate the vascular anatomy. CONTRAST:  12mL OMNIPAQUE IOHEXOL 350 MG/ML SOLN COMPARISON:  Chest 07/24/2020 FINDINGS: Cardiovascular: Excellent pulmonary artery opacification. Negative for pulmonary embolism. Moderate pulmonary artery enlargement compatible with pulmonary artery hypertension. Main pulmonary artery 33 mm in diameter. Cardiac enlargement. No pericardial effusion. Thoracic aorta normal in caliber. No significant opacification of the aorta. Mediastinum/Nodes: Negative for mediastinal mass or adenopathy. Lungs/Pleura: Severe diffuse bilateral airspace disease most prominent in the bases. No pleural effusion. Upper Abdomen: Negative Musculoskeletal: No acute skeletal abnormality. Review of the MIP images confirms the above findings. IMPRESSION: Negative for pulmonary embolism.  Pulmonary artery hypertension. Severe bilateral airspace disease most prominent the bases compatible with COVID pneumonia. Electronically Signed   By: Franchot Gallo M.D.   On: 07/28/2020 12:21   US RENAL  Result Date: 07/25/2020 CLINICAL DATA:  Acute renal  failure. EXAM: RENAL / URINARY TRACT ULTRASOUND COMPLETE COMPARISON:  None. FINDINGS: Right Kidney: Renal measurements: 12.0 x 4.5 x 3.9 cm = volume: 110 mL. Echogenicity within normal limits. No mass or hydronephrosis visualized. Left Kidney: Renal measurements: 12.7 x 5.6 x 4.4 cm = volume: 162 mL. Echogenicity within normal limits. No mass or hydronephrosis visualized. Bladder: Appears normal for degree of bladder distention. Other: None. IMPRESSION: Normal size kidneys without hydronephrosis. Electronically Signed   By: Marin Olp M.D.   On: 07/25/2020 16:40   DG Chest Port 1 View  Result Date: 08/18/2020 CLINICAL DATA:  Aspiration EXAM: PORTABLE CHEST 1 VIEW COMPARISON:  08/11/2020 FINDINGS: Endotracheal tube is no longer present. Unchanged position of left IJ central venous catheter. Bilateral airspace opacities are unchanged. Small pleural effusions. IMPRESSION: Unchanged bilateral airspace opacities. Electronically Signed   By: Ulyses Jarred M.D.   On: 08/18/2020 00:53   DG Chest Port 1  View  Result Date: 08/11/2020 CLINICAL DATA:  64 year old female with respiratory failure. EXAM: PORTABLE CHEST 1 VIEW COMPARISON:  Chest radiograph dated 08/20/2020. FINDINGS: Endotracheal tube with tip in the proximal right mainstem bronchus. Recommend retraction by approximately 4 cm. Left IJ central venous line in similar position. Bilateral pulmonary densities, right greater left, appears similar to prior radiograph. Small bilateral pleural effusions may be present. No pneumothorax. Stable cardiac silhouette. No acute osseous pathology. IMPRESSION: 1. Endotracheal tube with tip in the proximal right mainstem bronchus. Recommend retraction by approximately 4 cm. 2. No significant interval change in bilateral pulmonary opacities. These results were called by telephone at the time of interpretation on 08/11/2020 at 3:39 pm to provider Adalberto Ill, who verbally acknowledged these results. Electronically Signed    By: Anner Crete M.D.   On: 08/11/2020 15:50   DG Chest Port 1 View  Result Date: 08/10/2020 CLINICAL DATA:  Acute respiratory failure. EXAM: PORTABLE CHEST 1 VIEW COMPARISON:  08/07/2020.  07/31/2020. FINDINGS: Endotracheal tube, NG tube, left IJ line in stable position. Stable cardiomegaly. Multifocal bilateral pulmonary infiltrates are again noted. Small bilateral pleural effusions. No pneumothorax. IMPRESSION: 1. Lines and tubes in stable position. 2. Stable cardiomegaly. 3. Multifocal bilateral pulmonary infiltrates again noted. No interim change. Electronically Signed   By: Marcello Moores  Register   On: 08/10/2020 05:13   DG Chest Port 1 View  Result Date: 08/07/2020 CLINICAL DATA:  COVID-19 pneumonia. EXAM: PORTABLE CHEST 1 VIEW COMPARISON:  Abdominal x-ray dated August 01, 2020. Chest x-ray dated July 31, 2020. FINDINGS: Unchanged endotracheal and enteric tubes. Unchanged left internal jugular central venous catheter. Stable cardiomediastinal silhouette. Poor inspiratory effort compared to the prior study. Peripheral and basilar predominant patchy airspace disease in the right greater than left lungs is not significantly changed. No pneumothorax or pleural effusion. No acute osseous abnormality. IMPRESSION: 1. Unchanged multifocal pneumonia. Electronically Signed   By: Titus Dubin M.D.   On: 08/07/2020 10:00   DG Chest Port 1 View  Result Date: 07/31/2020 CLINICAL DATA:  Orogastric tube placement EXAM: PORTABLE CHEST 1 VIEW COMPARISON:  July 30, 2020. FINDINGS: Endotracheal tube tip is 6.0 cm above carina. Orogastric tube tip is at the gastroesophageal junction. Left jugular catheter tip is in the superior vena cava. No pneumothorax. Patchy airspace opacity is present bilaterally, primarily in the periphery of the upper and lower lung regions, essentially stable. Heart is upper normal in size with pulmonary vascularity normal. No adenopathy. No bone lesions. IMPRESSION: Tube and  catheter positions as described without pneumothorax. Note that the orogastric tube tip is at the gastroesophageal junction. Advise advancing orogastric tube 8-10 cm. Patchy airspace opacity bilaterally, stable. Stable cardiac silhouette. Electronically Signed   By: Lowella Grip III M.D.   On: 07/31/2020 12:14   DG Chest Port 1 View  Result Date: 07/30/2020 CLINICAL DATA:  Worsening COVID pneumonia.  Intubation. EXAM: PORTABLE CHEST 1 VIEW COMPARISON:  07/29/2020 FINDINGS: The endotracheal tube is 4.3 cm above the carina. The left IJ catheter tip is in the distal SVC near the cavoatrial junction. The NG tube is coursing down the esophagus and into the stomach. The tip is in the fundal region and the proximal port is near the GE junction. This could be advanced several cm. Persistent diffuse interstitial and airspace process in the lungs but slight improved aeration after intubation. No pneumothorax. IMPRESSION: 1. Persistent infiltrates but slight improved aeration after intubation. 2. ET tube and left IJ catheter in good position. 3. NG tube  tip in the fundal region and proximal port is near the GE junction. This could be advanced several cm. Electronically Signed   By: Marijo Sanes M.D.   On: 07/30/2020 13:11   DG Chest Port 1 View  Result Date: 07/24/2020 CLINICAL DATA:  Sepsis evaluation. EXAM: PORTABLE CHEST 1 VIEW COMPARISON:  07/25/2010 FINDINGS: Opacities throughout the lateral aspect of the right hemithorax. Evidence for air bronchograms at the medial right lung base. Slightly prominent left lung interstitial markings. Heart size is within normal limits for technique. Trachea is midline. IMPRESSION: Densities at the right lung base and along the lateral aspect of the right hemithorax. Findings likely represent a combination of pleural and parenchymal disease in the right chest. Findings could be better characterized with a chest CT with IV contrast. Electronically Signed   By: Markus Daft M.D.    On: 07/24/2020 09:32   ECHOCARDIOGRAM COMPLETE  Result Date: 07/26/2020    ECHOCARDIOGRAM REPORT   Patient Name:   NATHANIA WALDMAN Date of Exam: 07/25/2020 Medical Rec #:  [id number]      Height:       65.0 in Accession #:    [id number]     Weight:       175.0 lb Date of Birth:  09-20-1956      BSA:          1.869 m Patient Age:    77 years       BP:           86/57 mmHg Patient Gender: F              HR:           105 bpm. Exam Location:  [facility] Procedure: 2D Echo, Cardiac Doppler and Color Doppler Indications:     Dyspnea 786.09  History:         Patient has no prior history of Echocardiogram examinations.                  Risk Factors:Hypertension and Dyslipidemia.  Sonographer:     Sherrie Sport RDCS (AE) Referring Phys:  [id number] DWAYNE D CALLWOOD Diagnosing Phys: Yolonda Kida MD  Sonographer Comments: Technically difficult study due to poor echo windows and no apical window. Bandage in left apical space- no apical attempted. IMPRESSIONS  1. Techically difficult study.  2. Left ventricular ejection fraction, by estimation, is 45 to 50%. The left ventricle has mildly decreased function. The left ventricle demonstrates global hypokinesis. Left ventricular diastolic parameters are indeterminate.  3. Right ventricular systolic function is normal. The right ventricular size is normal.  4. The mitral valve is normal in structure. No evidence of mitral valve regurgitation.  5. The aortic valve is normal in structure. Aortic valve regurgitation is not visualized. Conclusion(s)/Recommendation(s): Poor windows for evaluation of left ventricular function by transthoracic echocardiography. Would recommend an alternative means of evaluation. FINDINGS  Left Ventricle: Left ventricular ejection fraction, by estimation, is 45 to 50%. The left ventricle has mildly decreased function. The left ventricle demonstrates global hypokinesis. The left ventricular internal cavity size was normal in size. There is  no left ventricular  hypertrophy. Left ventricular diastolic parameters are indeterminate. Right Ventricle: The right ventricular size is normal. No increase in right ventricular wall thickness. Right ventricular systolic function is normal. Left Atrium: Left atrial size was normal in size. Right Atrium: Right atrial size was normal in size. Pericardium: There is no evidence of pericardial effusion. Mitral Valve: The mitral valve is  normal in structure. No evidence of mitral valve regurgitation. Tricuspid Valve: The tricuspid valve is normal in structure. Tricuspid valve regurgitation is not demonstrated. Aortic Valve: The aortic valve is normal in structure. Aortic valve regurgitation is not visualized. Pulmonic Valve: The pulmonic valve was normal in structure. Pulmonic valve regurgitation is not visualized. Aorta: The ascending aorta was not well visualized. IAS/Shunts: No atrial level shunt detected by color flow Doppler. Additional Comments: Techically difficult study.  LEFT VENTRICLE PLAX 2D LVIDd:         3.90 cm LVIDs:         3.47 cm LV PW:         1.44 cm LV IVS:        0.99 cm LVOT diam:     2.20 cm LVOT Area:     3.80 cm  LEFT ATRIUM         Index LA diam:    5.50 cm 2.94 cm/m   AORTA Ao Root diam: 2.90 cm  SHUNTS Systemic Diam: 2.20 cm Dwayne D Callwood MD Electronically signed by Yolonda Kida MD Signature Date/Time: 07/26/2020/5:48:12 PM    Final     Assessment & Plan    Principal Problem: Sepsis ([place]) Active Problems: Hypertension Morbid obesity ([place]) Hypercalcemia Hyperthyroidism AKI (acute kidney injury) ([place]) COVID-19 virus infection Atrial fibrillation with RVR ([facility]CC) Hypokalemia   1.Paroxysmalatrial fibrillation with rapid ventricular rate, exacerbated by probable aspiration pneumonia and respiratory failure requiring intubation. She has been on amiodarone drip,  IV digoxin and heparin drip due to aspiration previously. Awaiting repeat SLP evaluation prior to resuming po  meds. She has been deferring but state she will comply today.  She converted to sinus rhythm at a rate of 90 bpm.Will continue with iv amiodarone, iv digoxin and iv heparin until taking po.   2.COVID-19 pneumonia/sepsis, extubated, followed by vomiting and probable aspiration pneumoniawith recurre nt respiratory failure requiring intubation,now extubated and appears to be doing well.  Appears to agree to a swallowing study.  Would switch back to p.o. meds when safe.  Recommendations  1.Agree with current therapy 2.Continue amiodarone infusion,transition to200 mgp.o.twice dailywhenable to swallow safely 3.Continue dig 0.25 mg IV daily,transition to0.25 gp.o.dailywhenable to swallow safely 4.ResumeEliquis 5 mg twice daily when able to swallow safely   Signed, Javier Docker. Senetra Dillin MD 08/21/2020, 7:43 AM  Pager: [phone]

## 2020-08-22 DIAGNOSIS — I4891 Unspecified atrial fibrillation: Secondary | ICD-10-CM | POA: Diagnosis not present

## 2020-08-22 DIAGNOSIS — E0590 Thyrotoxicosis, unspecified without thyrotoxic crisis or storm: Secondary | ICD-10-CM

## 2020-08-22 DIAGNOSIS — N179 Acute kidney failure, unspecified: Secondary | ICD-10-CM | POA: Diagnosis not present

## 2020-08-22 DIAGNOSIS — A419 Sepsis, unspecified organism: Secondary | ICD-10-CM | POA: Diagnosis not present

## 2020-08-22 DIAGNOSIS — U071 COVID-19: Secondary | ICD-10-CM | POA: Diagnosis not present

## 2020-08-22 LAB — CBC
HCT: 38.2 % (ref 36.0–46.0)
Hemoglobin: 12.1 g/dL (ref 12.0–15.0)
MCH: 27.3 pg (ref 26.0–34.0)
MCHC: 31.7 g/dL (ref 30.0–36.0)
MCV: 86 fL (ref 80.0–100.0)
Platelets: 150 10*3/uL (ref 150–400)
RBC: 4.44 MIL/uL (ref 3.87–5.11)
RDW: 16.2 % — ABNORMAL HIGH (ref 11.5–15.5)
WBC: 8.2 10*3/uL (ref 4.0–10.5)
nRBC: 0.5 % — ABNORMAL HIGH (ref 0.0–0.2)

## 2020-08-22 LAB — BASIC METABOLIC PANEL
Anion gap: 15 (ref 5–15)
BUN: 10 mg/dL (ref 8–23)
CO2: 28 mmol/L (ref 22–32)
Calcium: 11.2 mg/dL — ABNORMAL HIGH (ref 8.9–10.3)
Chloride: 96 mmol/L — ABNORMAL LOW (ref 98–111)
Creatinine, Ser: 1.28 mg/dL — ABNORMAL HIGH (ref 0.44–1.00)
GFR, Estimated: 44 mL/min — ABNORMAL LOW (ref >60)
Glucose, Bld: 102 mg/dL — ABNORMAL HIGH (ref 70–99)
Potassium: 3.2 mmol/L — ABNORMAL LOW (ref 3.5–5.1)
Sodium: 139 mmol/L (ref 135–145)

## 2020-08-22 LAB — GLUCOSE, CAPILLARY
Glucose-Capillary: 92 mg/dL (ref 70–99)
Glucose-Capillary: 89 mg/dL (ref 70–99)
Glucose-Capillary: 118 mg/dL — ABNORMAL HIGH (ref 70–99)
Glucose-Capillary: 109 mg/dL — ABNORMAL HIGH (ref 70–99)

## 2020-08-22 LAB — PHOSPHORUS: Phosphorus: 3.6 mg/dL (ref 2.5–4.6)

## 2020-08-22 LAB — MAGNESIUM: Magnesium: 1.8 mg/dL (ref 1.7–2.4)

## 2020-08-22 LAB — HEPARIN LEVEL (UNFRACTIONATED): Heparin Unfractionated: 0.24 IU/mL — ABNORMAL LOW (ref 0.30–0.70)

## 2020-08-22 MED ORDER — MAGNESIUM SULFATE 2 GM/50ML IV SOLN
2.0000 g | Freq: Once | INTRAVENOUS | Status: AC
  Administered 2020-08-22: 2 g via INTRAVENOUS
  Filled 2020-08-22: qty 50

## 2020-08-22 MED ORDER — DEXTROSE-NACL 5-0.45 % IV SOLN: INTRAVENOUS | Status: DC

## 2020-08-22 MED ORDER — POTASSIUM CHLORIDE 10 MEQ/100ML IV SOLN
10.0000 meq | INTRAVENOUS | Status: DC
  Filled 2020-08-22 (×4): qty 100

## 2020-08-22 MED ORDER — POTASSIUM CHLORIDE 10 MEQ/100ML IV SOLN
10.0000 meq | INTRAVENOUS | Status: AC
  Administered 2020-08-22 (×4): 10 meq via INTRAVENOUS
  Filled 2020-08-22 (×4): qty 100

## 2020-08-22 MED ORDER — ENSURE ENLIVE PO LIQD: 237.0000 mL | Freq: Three times a day (TID) | ORAL | Status: DC

## 2020-08-22 MED ORDER — POTASSIUM CHLORIDE 10 MEQ/100ML IV SOLN
10.0000 meq | INTRAVENOUS | Status: AC
  Administered 2020-08-22 (×2): 10 meq via INTRAVENOUS
  Filled 2020-08-22 (×2): qty 100

## 2020-08-22 NOTE — Progress Notes (Signed)
SLP Cancellation Note  Patient Details Name: Courtney Anderson MRN: [id number] DOB: 04-27-1956   Cancelled treatment:       Reason Eval/Treat Not Completed: Patient declined, no reason specified (chart reviewed; consulted NSG/MD). Attempted to see pt today; she quietly declined. She had accepted 3 sips of soda w/ NSG earlier per NSG report. Otherwise, she has been refusing po diet; even Medications. Suspect pt's presentation is related to declined Cognitive status/awareness. As pt is refusing oral intake and appears not meeting her nutrition/hydration needs orally, recommended to MD to explore w/ family the options of alternative means of feeding vs Hospice GOC. MD agreed noting continued refusal even for physical assessment/general care w/ MD/NSG. ST services will sign off at this time; MD to reconsult if new dysphagia needs arise. Recommend continue w/ a Pureed diet, thin liquids; aspiration precautions; Pills in Puree for safer swallowing. Precautions posted at bedside.      [name], MS, CCC-SLP Speech Language Pathologist Rehab Services [phone] [facility] 08/22/2020, 2:21 PM

## 2020-08-22 NOTE — Progress Notes (Signed)
Daily Progress Note   Patient Name: Courtney Anderson       Date: 08/22/2020 DOB: Dec 13, 1955  Age: 64 y.o. MRN#: [id number] Attending Physician: Nolberto Hanlon, MD Primary Care Physician: Guadalupe Maple, MD Admit Date: 07/24/2020  Reason for Consultation/Follow-up: Establishing goals of care  Subjective: Patient awake and watching TV.  Breakfast tray at the bedside untouched.  Patient requests to hang her some Kleenex in which she called up some mucus and discarded.  She denies pain.  No acute distress noted. No longer on contact/respiratory precautions.   Patient able to appropriately express her name and date of birth.  She states "I think I am in the hospital but not sure".  Follow some commands.  I attempted to somewhat discuss with patient her poor appetite.  She states "I cannot eat that dog food".  She does ask if I would bring her some chicken.  Explained giving her illness and concerns for aspiration she is unable to have regular food at this time.  Patient expresses her dissatisfaction.  Although patient has some confusion I did ask her if she did not improve on her appetite would she want to have a tube placed and patient immediately shook her head no followed by an aggressive "no".  I also discussed with patient being replaced on any forms of life support and she stated "do not do that again".  I spent some time engaging in conversation with patient in general.  She is able to identify her son and her previous living arrangements.  Patient continues to state she is hopeful she can return home and that her sons would take care of her.  Prior to leaving patient asked me was I coming back to see her on tomorrow.  Advised I would definitely check in with her.  I attempted to call patient son at the bedside and again outside of the room.  Unable to reach a voicemail has been left in addition to the one left on yesterday.  Length of Stay: 29 days  Vital Signs: BP (!) 157/91 (BP Location:  Right Arm)   Pulse 69   Temp 98.4 F (36.9 C) (Oral)   Resp 16   Ht 5\' 5"  (1.651 m)   Wt 94.9 kg   SpO2 100%   BMI 34.83 kg/m  SpO2: SpO2: 100 % O2 Device: O2 Device: Nasal Cannula O2 Flow Rate: O2 Flow Rate (L/min): 4 L/min  Physical Exam: NAD, awake and alert to self Normal breathing pattern Flat affect at times, will follow some commands, some confusion noted       Palliative Care Assessment & Plan   Code Status:  Full code  Goals of Care/Recommendations:  Continue with current plan of care per medical team  Patient continues to have poor p.o. intake.  If this does not improved recommendations for PEG may be required.  I have been unable to speak with patient's son however again per previous conversations his request was for full scope aggressive interventions.  When patient was originally intubated he expressed he would be open to PEG if needed anticipate this will continue to be his response as he expressed over the past month his family is high faith in God and his mother is healing and allowing her every opportunity to improve.  Multiple attempts to contact son.  Voicemail left.  PMT will continue to support and follow as needed  Prognosis: Guarded  Discharge Planning: To Be Determined  Thank you for allowing the  Palliative Medicine Team to assist in the care of this patient.  Time Total: 40 min.   Visit consisted of counseling and education dealing with the complex and emotionally intense issues of symptom management and palliative care in the setting of serious and potentially life-threatening illness.Greater than 50%  of this time was spent counseling and coordinating care related to the above assessment and plan.  Alda Lea, AGPCNP-BC  Palliative Medicine Team [phone]

## 2020-08-22 NOTE — Progress Notes (Signed)
[facility] for Heparin (from Eliquis) Indication: atrial fibrillation  Patient Measurements: Height: 5\' 5"  (165.1 cm) Weight: 94.9 kg (209 lb 4.8 oz) IBW/kg (Calculated) : 57 HEPARIN DW (KG): 73.7  Labs: Recent Labs    08/20/20 0346 08/20/20 0346 08/21/20 0637 08/22/20 0444  HGB 10.9*   < > 13.3 12.1  HCT 34.5*  --  41.9 38.2  PLT 104*  --  113* 150  HEPARINUNFRC 0.53  --  0.47 0.24*  CREATININE 0.98  --  1.07* 1.28*   < > = values in this interval not displayed.   Estimated Creatinine Clearance: 49.9 mL/min (A) (by C-G formula based on SCr of 1.28 mg/dL (H)).  Medical History: Past Medical History:  Diagnosis Date  . Allergy   . Elevated transaminase level   . Hyperlipidemia   . Hypertension   . Obesity    Assessment: Patient is a 63 y/o F with medical history including hypertension, hypothyroidism who is admitted with acute respiratory failure secondary to pneumonia. Admission complicated by atrial fibrillation with RVR. Pharmacy has been consulted to initiate heparin infusion for stroke prophylaxis in the setting of atrial fibrillation.   Patient was initially initiated on heparin infusion which was converted to PO anticoagulation with apixaban. Patient was then switched back to heparin infusion due to aspiration events. Last Eliquis 5mg  dose was 10/12 at 0900. SLP saw patient yesterday and recommended medication administration as crushed with puree. However, patient refusing to take medications crushed in applesauce.   Goal of Therapy:  HL 0.3 - 0.7 Monitor platelets by anticoagulation protocol: Yes   Plan:  --Unable to obtain heparin level as patient is refusing lab draws. Patient previously therapeutic for several days on heparin infusion at 1200 units/hr --CBC largely stable this morning --Cardiology recommending converting anticoagulation back to apixaban when patient is able to swallow safely. Patient cleared by SLP  yesterday for medications crushed with puree but patient is refusing PO medications at this time --Will need to consider risk vs benefit of continuing IV heparin with lack of monitoring    Courtney Anderson 08/22/2020,11:47 AM

## 2020-08-22 NOTE — Progress Notes (Addendum)
PROGRESS NOTE    Courtney Anderson  MWU:[id number] DOB: 1956/01/21 DOA: 07/24/2020 PCP: Courtney Maple, MD    Brief Narrative:  Courtney Anderson an 64 y.o.female.  [id number] is a 65 year old female with history noted below, who was admitted to [facility] on 2020-07-24 brought in via EMS as a "code sepsis". Patient subsequently tested positive for COVID-19. She presented with oxygen saturations of 69% on room air and had to be placed on nonrebreather mask to achieve oxygen saturations in the 90s. She was subsequently admitted to the Covid unit for management of COVID-19 pneumonia. She was noted to have acute kidney injury she was treated with remdesivir, zinc, vitamin C and antitussives. She was placed empirically on Rocephin and azithromycin. She continued to have increased FiO2 requirements. She was refusing to self prone. She had atrial fibrillation and was seen by cardiologyto assist in management.   CC: f/u respiratory failure  07/31/20- patient is on 90% FiO2, will modify meds as patient is on metoprolol and levophed as well as cardizem. Will d/c propofol to improve SBP and remove arythmogenic vasopressor if able. Plan to have CVP trending and diuresis today. Goals of care discussion today - patient made DNR per sister and son of patient. I had 2 separate phone meetings with son and separate conversation with sister today to explain patient is worse with shock and renal failure requiring multiple pressors.   08/01/20- patient remains critically ill. FiO2 has been weaned down to 80% today.  08/03/20- patient remains critically ill shes being proned, weaning FiO2 as able.  08/04/20-patient with no interval changes continue with weaning and proning protocol. Continue weaning FiO2 and PEEP as to optimize for SBT 08/06/20- patient is weaned down 40%FiO2 today, will attempt awakening trial with SBT, I spoke to son [name] today, he appreciates update and does not want Elink video view  at this time.  08/07/20- patient is requiring 50%FiO2 today 08/08/20- Continuing to wean vent. FiO2 is 28%, will trial SBT today 08/09/20-tachypneic and hypertensive with SBT. Asynchronous.FiO2 back up to 50% 10/7 severeresp failure, attempt SAT/SBT 10/7 extubated to biPAP 10/8 Tachycardic , more confused,patient re-intubated ETT 8.0 10/9 remains on vent 10/11 severe resp failure on vent 10/12 failed weaning trial 10/12 successfully extubated 10/14 TRANSFER TO TRH 10/15 somnolent, sleepy , not interactive speech and swallow passed, but had aspiration event. At risk for aspiration .  10/16- mentation better today, down to 4L. Son at bedside 10/17- pt in foul mood, upset why she is here. Thinks she has been here for 160 days".  10/18- awake, calmer today, mentating well.  Refusing meds being crushed with applesauce or ice cream as speech had recommended 10/19-pt refusing to eat diet provided. Refused to take po meds crushed with apple sauce. Spoke to son today, told him about this. Also discussed her nutrition status, if she refuses she will need PEG placement. He wants to come by tonight after work to encourage her to take the food and if not, he will think about the PEG.   Consultants:   Pccm, cardiology  Procedures:  Antimicrobials:      Subjective: Still refusing food and po meds being crushed. Poor attitude with exam. Refuses to answer my questions   Objective: Vitals:   08/21/20 2036 08/21/20 2044 08/22/20 0413 08/22/20 0700  BP: 127/76  134/81 (!) 150/89  Pulse: 82  74 75  Resp: (!) 21  17 (!) 25  Temp:  97.6 F (36.4 C) (!) 97.5 F (36.4  C) 98.3 F (36.8 C)  TempSrc:  Oral Oral Oral  SpO2: 93%  100% 100%  Weight:   94.9 kg   Height:        Intake/Output Summary (Last 24 hours) at 08/22/2020 0751 Last data filed at 08/22/2020 0435 Gross per 24 hour  Intake --  Output 1875 ml  Net -1875 ml   Filed Weights   08/12/20 0530 08/21/20 0424 08/22/20 0413   Weight: 109.9 kg 99.2 kg 94.9 kg    Examination: Calm, nad, poor attitude Refuses to participate with answering my neuro questions.  coarse bs , no wheezing Abdomen soft, nt/nd, decease bs No edema  Data Reviewed: I have personally reviewed following labs and imaging studies  CBC: Recent Labs  Lab 08/16/20 0616 08/17/20 0509 08/19/20 0219 08/19/20 1231 08/20/20 0346 08/21/20 0637 08/22/20 0444  WBC 6.6   < > 6.8 5.9 5.6 7.4 8.2  NEUTROABS 4.6  --   --   --   --   --   --   HGB 11.4*   < > 10.7* 11.3* 10.9* 13.3 12.1  HCT 35.9*   < > 34.0* 35.8* 34.5* 41.9 38.2  MCV 87.3   < > 86.1 86.1 85.8 86.4 86.0  PLT 167   < > 111* 110* 104* 113* 150   < > = values in this interval not displayed.   Basic Metabolic Panel: Recent Labs  Lab 08/18/20 0441 08/18/20 1829 08/19/20 0219 08/19/20 0219 08/19/20 1231 08/19/20 2001 08/20/20 0346 08/21/20 0637 08/22/20 0444  NA 138  --  136  --  136  --  136 137 139  K 2.7*   < > 3.2*   < > 3.4* 3.5 3.5 3.7 3.2*  CL 93*  --  94*  --  95*  --  96* 95* 96*  CO2 28  --  27  --  27  --  29 26 28   GLUCOSE 131*  --  85  --  77  --  201* 96 102*  BUN 8  --  10  --  11  --  10 8 10   CREATININE 0.91  --  1.04*  --  1.03*  --  0.98 1.07* 1.28*  CALCIUM 9.6  --  10.0  --  10.1  --  9.8 10.5* 11.2*  MG 1.5*  --  1.8  --  2.3  --  1.9 1.9 1.8  PHOS 3.3  --  2.9  --   --   --  2.5 3.1 3.6   < > = values in this interval not displayed.   GFR: Estimated Creatinine Clearance: 49.9 mL/min (A) (by C-G formula based on SCr of 1.28 mg/dL (H)). Liver Function Tests: Recent Labs  Lab 08/17/20 0509 08/18/20 0441 08/19/20 0219 08/20/20 0346 08/21/20 0637  AST 28 28 21 21 22   ALT 30 29 26 26 25   ALKPHOS 66 69 64 62 66  BILITOT 1.4* 1.4* 1.3* 0.9 1.0  PROT 6.9 7.1 6.7 6.4* 7.3  ALBUMIN 2.7* 3.0* 2.6* 2.7* 3.2*   No results for input(s): LIPASE, AMYLASE in the last 168 hours. No results for input(s): AMMONIA in the last 168 hours. Coagulation  Profile: No results for input(s): INR, PROTIME in the last 168 hours. Cardiac Enzymes: No results for input(s): CKTOTAL, CKMB, CKMBINDEX, TROPONINI in the last 168 hours. BNP (last 3 results) No results for input(s): PROBNP in the last 8760 hours. HbA1C: No results for input(s): HGBA1C in the last 72  hours. CBG: Recent Labs  Lab 08/21/20 1132 08/21/20 1601 08/21/20 2035 08/21/20 2332 08/22/20 0732  GLUCAP 110* 99 111* 114* 92   Lipid Profile: No results for input(s): CHOL, HDL, LDLCALC, TRIG, CHOLHDL, LDLDIRECT in the last 72 hours. Thyroid Function Tests: No results for input(s): TSH, T4TOTAL, FREET4, T3FREE, THYROIDAB in the last 72 hours. Anemia Panel: No results for input(s): VITAMINB12, FOLATE, FERRITIN, TIBC, IRON, RETICCTPCT in the last 72 hours. Sepsis Labs: No results for input(s): PROCALCITON, LATICACIDVEN in the last 168 hours.  No results found for this or any previous visit (from the past 240 hour(s)).       Radiology Studies: No results found.      Scheduled Meds: . aspirin  81 mg Per Tube Daily  . Chlorhexidine Gluconate Cloth  6 each Topical Daily  . cholecalciferol  2,000 Units Per Tube Daily  . collagenase   Topical Daily  . digoxin  0.25 mg Intravenous Daily  . insulin aspart  0-9 Units Subcutaneous Q4H  . mouth rinse  15 mL Mouth Rinse q12n4p  . multivitamin with minerals  1 tablet Per Tube Daily  . nystatin   Topical TID  . pneumococcal 23 valent vaccine  0.5 mL Intramuscular Tomorrow-1000   Continuous Infusions: . sodium chloride 10 mL/hr at 08/22/20 0400  . amiodarone 30 mg/hr (08/22/20 0532)  . ampicillin-sulbactam (UNASYN) IV 3 g (08/22/20 0158)  . famotidine (PEPCID) IV Stopped (08/21/20 2319)  . heparin 1,200 Units/hr (08/22/20 0533)  . potassium chloride      Assessment & Plan:   Principal Problem:   Sepsis ([place]) Active Problems:   Hypertension   Morbid obesity ([place])   Hypercalcemia   Hyperthyroidism   AKI (acute kidney  injury) ([place])   COVID-19 virus infection   Atrial fibrillation with RVR (HCC)   Hypokalemia   Pressure injury of skin   Acute hypoxemic respiratory failure due to COVID-19 pneumonia / ARDScomplicated by renal failure-  s/p extubation and re-intubated for acute aspiration pneumonia At risk of aspiration Continue Unasyn day 4. Lasix dc'd as pt was more on dry side, assess daily and use prn    Morbid obesity, possible OSA.  -Because medical issues and respiratory status  Needs outpatient sleep study    AFIB WITH RVR- currently in sinus rhythm Cardiology following On IV amiodarone unable to transition to p.o. as patient refusing to take p.o. meds crushed as speech recommended Continue heparin drip until able to take p.o. anticoagulation Continue IV digoxin   Hypophosphatemia- Replace and remained stable Phos today 3.6  Hypokalemia Today 3.2, will give IV potassium Monitor levels  Nutrition Service with NSG today and tomorrow for calorie count  Patient refusing to eat what is prescribed by speech and also taking her p.o. medications.  Discussed with son that they need to make a decision about her feeding as she has not taken any nutrition.  He wants to talk to her tonight after work and encourage her to take p.o. intake if she does not then he will think about PEG placement Resume ivf for hydration     DVT prophylaxis: heparin drip Code Status: DNR Family Communication: None at bedside   Status is: Inpatient  Remains inpatient appropriate because:IV treatments appropriate due to intensity of illness or inability to take PO   Dispo: The patient is from: Home              Anticipated d/c is to: family refused SNF, needs HH on discharge to home  Anticipated d/c date is: 3 days              Patient currently is not medically stable to d/c. needs transitioning to po medications. Needs nutrition, PEG placement pending as son is thinking about it.           LOS: 29 days   Time spent: 35 min with >50% on coc    Nolberto Hanlon, MD [facility] Pager [phone]  If 7PM-7AM, please contact night-coverage [email or web address] Password [email or web address] 08/22/2020, 7:51 AM

## 2020-08-22 NOTE — TOC Initial Note (Addendum)
Transition of Care [facility]) - Initial/Assessment Note    Patient Details  Name: Courtney Anderson MRN: [id number] Date of Birth: 30-Oct-1956  [facility] CM/SW Contact:    Courtney Sessions, RN Phone Number: 08/22/2020, 2:53 PM  Clinical Narrative:                  Noted that patient did not have initial assessment completed Due to covid isolation attempted to reach out to patient by phone x2.  She did not answer.   VM left for Courtney [name]  Per chart review patient lives at home with Courtney No DME PCP Courtney Anderson.  4L acute O2 Stage 3 wound 3 person assist with PT No DME in the home  Yesterday home health was set up with patient through [facility].      If patient is discharging home will require DME.  Awaiting return call from Courtney   Received return call from Courtney Anderson.  He confirms that patient will be discharging home when medically cleared.  He is aware that it is taking 2 people to position patient in bed, that patient has been unable to transfer, and unable to ambulate.   States that him and his brother Courtney Anderson will be providing 24/7 care.  Will need hospital bed, lift, WC, BSC, and potentially O2 at discharge.    PCP Courtney Anderson.  Ph[facility]acy [facility] - denies issues obtaining medications.  Baseline patient is independent with no DME  Per MD if patient nutrition does not improve may need PEG tube.   TOC following   Patient Goals and CMS Choice        Expected Discharge Plan and Services                                                Prior Living Arrangements/Services                       Activities of Daily Living Home Assistive Devices/Equipment: None ADL Screening (condition at time of admission) Patient's cognitive ability adequate to safely complete daily activities?: Yes Is the patient deaf or have difficulty hearing?: No Does the patient have difficulty seeing, even when wearing glasses/contacts?: No Does the  patient have difficulty concentrating, remembering, or making decisions?: No Patient able to express need for assistance with ADLs?: Yes Does the patient have difficulty dressing or bathing?: Yes Independently performs ADLs?: No Communication: Independent Dressing (OT): Needs assistance Is this a change from baseline?: Change from baseline, expected to last >3 days Grooming: Needs assistance Is this a change from baseline?: Change from baseline, expected to last >3 days Feeding: Independent Bathing: Needs assistance Is this a change from baseline?: Change from baseline, expected to last >3 days Toileting: Needs assistance Is this a change from baseline?: Change from baseline, expected to last >3days In/Out Bed: Needs assistance Is this a change from baseline?: Change from baseline, expected to last >3 days Walks in Home: Needs assistance Is this a change from baseline?: Change from baseline, expected to last >3 days Does the patient have difficulty walking or climbing stairs?: Yes Weakness of Legs: Both Weakness of Arms/Hands: None  Permission Sought/Granted                  Emotional Assessment  Admission diagnosis:  Atrial fibrillation with rapid ventricular response (HCC) [I48.91] Sepsis ([place]) [A41.9] Acute renal failure, unspecified acute renal failure type ([place]) [N17.9] Skin ulcer, unspecified ulcer stage ([place]) [L98.499] Acute hypoxemic respiratory failure due to COVID-19 ([place]) [U07.1, J96.01] COVID-19 [U07.1] Patient Active Problem List   Diagnosis Date Noted  . Pressure injury of skin 08/19/2020  . Sepsis ([place]) 07/24/2020  . AKI (acute kidney injury) ([place]) 07/24/2020  . COVID-19 virus infection 07/24/2020  . Atrial fibrillation with RVR ([place]) 07/24/2020  . Hypokalemia 07/24/2020  . Vitamin D deficiency 10/17/2019  . 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 Hypercalcemia 08/20/2018  . Hyperthyroidism 08/20/2018  . Multinodular goiter  08/20/2018  . Tachycardia 08/29/2017  . Hypertension 05/31/2015  . Morbid obesity ([place]) 05/31/2015  . Elevated transaminase level 05/31/2015  . Hyperlipidemia 05/31/2015   PCP:  Courtney Maple, MD Pharmacy:   [id number] - [place], [address] [place] [address] Phone: [phone] Fax: [phone]     Social Determinants of Health (SDOH) Interventions    Readmission Risk Interventions No flowsheet data found.

## 2020-08-22 NOTE — Progress Notes (Addendum)
Occupational Therapy Treatment Patient Details Name: Courtney Anderson MRN: [id number] DOB: 11/14/1955 Today's Date: 08/22/2020    History of present illness Courtney Anderson is a 77yoF PMH: HTN, hypoTSH, who comes to [facility] on 9/20 after 1 weeks of cough, congestion, weakness, intermittent fever chills. Pt admitted with code sepsis, (+) COVID19. Pt having issues with AF/RVR upon arrival.   OT comments  Courtney Anderson presents today with generalized weakness and severely limited endurance. She also displayed considerable confusion today, with her conversation vacillating between appropriate and nonsensical comments. Courtney Anderson stated she was thirsty but was unable to pick up a cup from her nearby bedside table. Therapist provided [place] to bring cup to mouth, but pt could not summon the energy to sip through the straw. Courtney Anderson son telephoned during the session, and pt required ModA to hold phone to her ear/mouth. She spoke <1 minute before telling her son to call later, as she was too tired to talk. Pt unable to come to sitting in bed. With much encouragement and multiple verbal and tactile cues, pt engaged in bed-level therex, with O2 sats dropping to mid-80s during movement. Pt is making good progress and will continue to benefit from skilled OT services while hospitalized. Discharge to SNF remains appropriate; Courtney Anderson is still in need of ongoing skilled rehabilitation services and 24-hour care.   Follow Up Recommendations  SNF    Equipment Recommendations       Recommendations for Other Services      Precautions / Restrictions Precautions Precautions: Fall Precaution Comments: multiple wounds; watch O2 levels Restrictions Weight Bearing Restrictions: No       Mobility Bed Mobility Overal bed mobility: Needs Assistance Bed Mobility: Rolling Rolling: Max assist         General bed mobility comments: Max A +1 for in-bed mobility. O2 sats dropping to mid-80s with movement.  Transfers                  General transfer comment: standing not attempted today, 2/2 weakness    Balance       Sitting balance - Comments: ModA for sitting balance                                   ADL either performed or assessed with clinical judgement   ADL Overall ADL's : Needs assistance/impaired Eating/Feeding: Moderate assistance Eating/Feeding Details (indicate cue type and reason): Pt requires modA with feeding, therapist needed to place utensils/drink in pt's hand, then guide to mouth                                   General ADL Comments: Pt very lethargic today, difficult to encourage her to move     Vision Patient Visual Report: No change from baseline     Perception     Praxis      Cognition Arousal/Alertness: Lethargic Behavior During Therapy: Flat affect Overall Cognitive Status: Impaired/Different from baseline Area of Impairment: Orientation;Attention;Awareness                               General Comments: Pt more confused today than yesterday, making frequent nonsensical statement        Exercises Total Joint Exercises Ankle Circles/Pumps: AROM;15 reps Knee Flexion: AROM;PROM;10 reps General Exercises -  Upper Extremity Shoulder Flexion: AROM;5 reps;Supine Other Exercises Other Exercises: cognitive re-orientation, educ re: role of OT, POC, bed level therex; family education (with son, via phone)   Shoulder Instructions       General Comments O2 sats dropped into mid-80s with movement    Pertinent Vitals/ Pain       Pain Assessment: No/denies pain  Home Living                                          Prior Functioning/Environment              Frequency  Min 2X/week        Progress Toward Goals  OT Goals(current goals can now be found in the care plan section)     Acute Rehab OT Goals Patient Stated Goal: return to home, feel better OT Goal Formulation: With  patient Time For Goal Achievement: 08/31/20 Potential to Achieve Goals: Good  Plan Discharge plan remains appropriate;Frequency remains appropriate    Co-evaluation                 AM-PAC OT "6 Clicks" Daily Activity     Outcome Measure   Help from another person eating meals?: A Lot Help from another person taking care of personal grooming?: A Lot Help from another person toileting, which includes using toliet, bedpan, or urinal?: Total Help from another person bathing (including washing, rinsing, drying)?: Total Help from another person to put on and taking off regular upper body clothing?: Total Help from another person to put on and taking off regular lower body clothing?: Total 6 Click Score: 8    End of Session    OT Visit Diagnosis: Muscle weakness (generalized) (M62.81)   Activity Tolerance Patient limited by fatigue;Patient limited by lethargy;Treatment limited secondary to medical complications (Comment) (O2 sats dropping into mid-80s with movement)   Patient Left in bed;with bed alarm set;with call bell/phone within reach   Nurse Communication          Time: 6803-2122 OT Time Calculation (min): 23 min  Charges: OT General Charges $OT Visit: 1 Visit OT Treatments $Self Care/Home Management : 8-22 mins $Therapeutic Exercise: 8-22 mins   Courtney Lobo, PhD, MS, OTR/L ascom [phone] 08/22/20, 1:52 PM

## 2020-08-22 NOTE — Progress Notes (Signed)
[place] for Heparin (from Eliquis) Indication: atrial fibrillation  Patient Measurements: Height: 5\' 5"  (165.1 cm) Weight: 94.9 kg (209 lb 4.8 oz) IBW/kg (Calculated) : 57 HEPARIN DW (KG): 73.7  Labs: Recent Labs    08/20/20 0346 08/20/20 0346 08/21/20 0637 08/22/20 0444  HGB 10.9*   < > 13.3 12.1  HCT 34.5*  --  41.9 38.2  PLT 104*  --  113* 150  HEPARINUNFRC 0.53  --  0.47 0.24*  CREATININE 0.98  --  1.07* 1.28*   < > = values in this interval not displayed.   Estimated Creatinine Clearance: 49.9 mL/min (A) (by C-G formula based on SCr of 1.28 mg/dL (H)).  Medical History: Past Medical History:  Diagnosis Date  . Allergy   . Elevated transaminase level   . Hyperlipidemia   . Hypertension   . Obesity    Assessment: Patient is a 64 y/o F with medical history including hypertension, hypothyroidism who is admitted with acute respiratory failure secondary to pneumonia. Admission complicated by atrial fibrillation with RVR. Pharmacy has been consulted to initiate heparin infusion for stroke prophylaxis in the setting of atrial fibrillation.   Patient was initially initiated on heparin infusion which was converted to PO anticoagulation with apixaban. Patient was then switched back to heparin infusion due to aspiration events. Last Eliquis 5mg  dose was 10/12 at 0900. SLP saw patient this AM and is recommending medication administration as crushed with puree.   Goal of Therapy:  HL 0.3 - 0.7 Monitor platelets by anticoagulation protocol: Yes   Plan:  --10/18 HL 0.47 therapeutic x 4. Will continue heparin infusion at 1200 units/hr. --Re-check  HL and CBC daily tomorrow AM --Cardiology recommending converting anticoagulation back to apixaban when patient is able to swallow safely  10/19:  HL @ 0444 = 0.24 Per RN, Heparin gtt was turned off for uncertain amount of time and restarted at 0200 so this level is probably invalid. Will  continue pt on current rate and recheck HL 6 hrs after restart.    Josephus Harriger D 08/22/2020,5:51 AM

## 2020-08-22 NOTE — Progress Notes (Signed)
Patient noted to have prolonged QTC (greater than 600) on bedside telemetry. Pt in NSR; on Amiodarone drip at 30mg /hr.  Discussed with Dr. Damita Dunnings. No EKG ordered at this time. Will check AM labs; check electrolytes per Dr. Damita Dunnings, as ordered.

## 2020-08-22 NOTE — Progress Notes (Signed)
[place] for Electrolyte Monitoring and Replacement   Recent Labs: Potassium (mmol/L)  Date Value  08/22/2020 3.2 (L)   Magnesium (mg/dL)  Date Value  08/22/2020 1.8   Calcium (mg/dL)  Date Value  08/22/2020 11.2 (H)   Calcium, Total (PTH) (mg/dL)  Date Value  07/24/2020 10.1   Albumin (g/dL)  Date Value  08/21/2020 3.2 (L)  10/22/2019 4.2   Phosphorus (mg/dL)  Date Value  08/22/2020 3.6   Sodium (mmol/L)  Date Value  08/22/2020 139  10/22/2019 139   Assessment: 64 year old female with PMHx of HTN, HLD, primary hyperparathyroidism who was admitted with COVID-19 PNA, also with A-fib with RVR and severe intertrigo. Patient extubated 10/12. Now on 4L [place].   Patient was NPO secondary to aspiration events. Patient seen by SLP today. Cleared patient to take medications crushed in puree. Dysphagia 1 diet has been ordered. Patient has converted to NSR. Continues on IV digoxin and IV amiodarone. Patient is refusing PO medications at this time.   Patient noted to have QTc > 600 on bedside telemetry this AM. Will need to monitor electrolytes closely. Last digoxin level was 1 on 10/15. Patient also with primary hyperparathyroidism and associated hypercalcemia. Will need to be cautious with digoxin.    MIVF: None Diuresis: IV Lasix 20 mg BID (d/c 10/18) UOP: 1875 mL yesterday  Goal of Therapy:  Potassium 4.0 - 5.1 mmol/L Magnesium 2.0 - 2.4 mg/dL All Other Electrolytes WNL  Plan:  10/19  K 3.2  Mag 1.8  Phos 3.6 Scr 1.28  Potassium low. MD ordered IV KCl 10 mEq x 2  Will order IV magnesium sulfate 2 g x 1  Patient refusing PO medications at this time  Benita Gutter 08/22/2020 11:57 AM

## 2020-08-23 DIAGNOSIS — U071 COVID-19: Secondary | ICD-10-CM | POA: Diagnosis not present

## 2020-08-23 DIAGNOSIS — J9601 Acute respiratory failure with hypoxia: Secondary | ICD-10-CM | POA: Diagnosis not present

## 2020-08-23 DIAGNOSIS — A419 Sepsis, unspecified organism: Secondary | ICD-10-CM | POA: Diagnosis not present

## 2020-08-23 DIAGNOSIS — I4891 Unspecified atrial fibrillation: Secondary | ICD-10-CM | POA: Diagnosis not present

## 2020-08-23 DIAGNOSIS — N179 Acute kidney failure, unspecified: Secondary | ICD-10-CM | POA: Diagnosis not present

## 2020-08-23 LAB — GLUCOSE, CAPILLARY
Glucose-Capillary: 109 mg/dL — ABNORMAL HIGH (ref 70–99)
Glucose-Capillary: 110 mg/dL — ABNORMAL HIGH (ref 70–99)
Glucose-Capillary: 113 mg/dL — ABNORMAL HIGH (ref 70–99)
Glucose-Capillary: 119 mg/dL — ABNORMAL HIGH (ref 70–99)
Glucose-Capillary: 91 mg/dL (ref 70–99)
Glucose-Capillary: 100 mg/dL — ABNORMAL HIGH (ref 70–99)

## 2020-08-23 LAB — BLOOD GAS, ARTERIAL
Acid-Base Excess: 9.1 mmol/L — ABNORMAL HIGH (ref 0.0–2.0)
Bicarbonate: 33.5 mmol/L — ABNORMAL HIGH (ref 20.0–28.0)
Expiratory PAP: 5
FIO2: 0.6
Inspiratory PAP: 12
O2 Saturation: 77.4 %
Patient temperature: 37
pCO2 arterial: 44 mmHg (ref 32.0–48.0)
pH, Arterial: 7.49 — ABNORMAL HIGH (ref 7.350–7.450)
pO2, Arterial: 38 mmHg — CL (ref 83.0–108.0)

## 2020-08-23 LAB — BASIC METABOLIC PANEL
Anion gap: 9 (ref 5–15)
BUN: 8 mg/dL (ref 8–23)
CO2: 29 mmol/L (ref 22–32)
Calcium: 9.9 mg/dL (ref 8.9–10.3)
Chloride: 98 mmol/L (ref 98–111)
Creatinine, Ser: 0.98 mg/dL (ref 0.44–1.00)
GFR, Estimated: >60 mL/min (ref >60)
Glucose, Bld: 113 mg/dL — ABNORMAL HIGH (ref 70–99)
Potassium: 3.2 mmol/L — ABNORMAL LOW (ref 3.5–5.1)
Sodium: 136 mmol/L (ref 135–145)

## 2020-08-23 LAB — PHOSPHORUS: Phosphorus: 2.3 mg/dL — ABNORMAL LOW (ref 2.5–4.6)

## 2020-08-23 LAB — HEPARIN LEVEL (UNFRACTIONATED): Heparin Unfractionated: 0.45 IU/mL (ref 0.30–0.70)

## 2020-08-23 LAB — MAGNESIUM: Magnesium: 2 mg/dL (ref 1.7–2.4)

## 2020-08-23 MED ORDER — KCL IN DEXTROSE-NACL 20-5-0.9 MEQ/L-%-% IV SOLN
INTRAVENOUS | Status: DC
  Filled 2020-08-23: qty 1000

## 2020-08-23 MED ORDER — KCL IN DEXTROSE-NACL 20-5-0.45 MEQ/L-%-% IV SOLN
INTRAVENOUS | Status: DC
  Filled 2020-08-23 (×8): qty 1000

## 2020-08-23 MED ORDER — POTASSIUM CHLORIDE 10 MEQ/100ML IV SOLN
10.0000 meq | INTRAVENOUS | Status: AC
  Administered 2020-08-23 (×4): 10 meq via INTRAVENOUS
  Filled 2020-08-23 (×4): qty 100

## 2020-08-23 NOTE — Progress Notes (Signed)
Patient Name: Courtney Anderson Date of Encounter: 08/23/2020  Hospital Problem List     Principal Problem:   Sepsis [facility]) Active Problems:   Hypertension   Morbid obesity ([place])   Hypercalcemia   Hyperthyroidism   AKI (acute kidney injury) ([place])   COVID-19 virus infection   Atrial fibrillation with RVR (HCC)   Hypokalemia   Pressure injury of skin    Patient Profile        64 yo with history of paroxysmal afib, covid 19 sepsis/pneumonia .Marland Kitchen On amiodarone  iv due to inability to take po.   Will discontinue digoxin and reduced dose of IV amiodarone.  Off heparin and iv digoxin. Will change to po amiodarone when taking po  Subjective   Refusing p.o. intake.  Inpatient Medications     aspirin  81 mg Per Tube Daily   Chlorhexidine Gluconate Cloth  6 each Topical Daily   cholecalciferol  2,000 Units Per Tube Daily   collagenase   Topical Daily   feeding supplement  237 mL Oral TID BM   insulin aspart  0-9 Units Subcutaneous Q4H   mouth rinse  15 mL Mouth Rinse q12n4p   multivitamin with minerals  1 tablet Per Tube Daily   nystatin   Topical TID   pneumococcal 23 valent vaccine  0.5 mL Intramuscular Tomorrow-1000    Vital Signs    Vitals:   08/23/20 0200 08/23/20 0415 08/23/20 0804 08/23/20 1102  BP: (!) 135/102 139/80 (!) 141/85 124/85  Pulse: 71 66 67 70  Resp: 16 20 19 19   Temp:  98.5 F (36.9 C) 97.7 F (36.5 C) (!) 97.5 F (36.4 C)  TempSrc:  Oral Oral Oral  SpO2: 99% 100% 100% 99%  Weight:      Height:        Intake/Output Summary (Last 24 hours) at 08/23/2020 1220 Last data filed at 08/23/2020 0500 Gross per 24 hour  Intake 799.98 ml  Output 1825 ml  Net -1025.02 ml   Filed Weights   08/12/20 0530 08/21/20 0424 08/22/20 0413  Weight: 109.9 kg 99.2 kg 94.9 kg    Physical Exam    GEN: Well nourished, well developed, in no acute distress.  HEENT: normal.  Neck: Supple, no JVD, carotid bruits, or masses. Cardiac: RRR, no murmurs, rubs, or  gallops. No clubbing, cyanosis, edema.  Radials/DP/PT 2+ and equal bilaterally.  Respiratory:  Respirations regular and unlabored, clear to auscultation bilaterally. GI: Soft, nontender, nondistended, BS + x 4. MS: no deformity or atrophy. Skin: warm and dry, no rash. Neuro:  Strength and sensation are intact. Psych: Normal affect.  Labs    CBC Recent Labs    08/21/20 0637 08/22/20 0444  WBC 7.4 8.2  HGB 13.3 12.1  HCT 41.9 38.2  MCV 86.4 86.0  PLT 113* 725   Basic Metabolic Panel Recent Labs    08/22/20 0444 08/23/20 0415  NA 139 136  K 3.2* 3.2*  CL 96* 98  CO2 28 29  GLUCOSE 102* 113*  BUN 10 8  CREATININE 1.28* 0.98  CALCIUM 11.2* 9.9  MG 1.8 2.0  PHOS 3.6 2.3*   Liver Function Tests Recent Labs    08/21/20 0637  AST 22  ALT 25  ALKPHOS 66  BILITOT 1.0  PROT 7.3  ALBUMIN 3.2*   No results for input(s): LIPASE, AMYLASE in the last 72 hours. Cardiac Enzymes No results for input(s): CKTOTAL, CKMB, CKMBINDEX, TROPONINI in the last 72 hours. BNP No results  for input(s): BNP in the last 72 hours. D-Dimer No results for input(s): DDIMER in the last 72 hours. Hemoglobin A1C No results for input(s): HGBA1C in the last 72 hours. Fasting Lipid Panel No results for input(s): CHOL, HDL, LDLCALC, TRIG, CHOLHDL, LDLDIRECT in the last 72 hours. Thyroid Function Tests No results for input(s): TSH, T4TOTAL, T3FREE, THYROIDAB in the last 72 hours.  Invalid input(s): FREET3  Telemetry    Normal sinus rhythm  ECG    No ischemia.  Normal sinus rhythm  Radiology    DG Chest 1 View  Result Date: 07/29/2020 CLINICAL DATA:  Initial evaluation for acute tachycardia. History of COVID pneumonia. EXAM: CHEST  1 VIEW COMPARISON:  Prior CTA from 07/28/2020 and radiograph from 07/24/2020. FINDINGS: Transverse heart size stable. Mediastinal silhouette within normal limits. Lungs mildly hypoinflated. Severe diffuse bilateral airspace disease, right greater than left,  progressed and worsened as compared to prior radiograph from 07/24/2020. Scattered air bronchograms noted at the lung bases bilaterally. Underlying pulmonary vascular congestion without overt pulmonary edema. No visible pleural effusion. No pneumothorax. Osseous structures unchanged. IMPRESSION: Severe diffuse bilateral airspace disease, right greater than left, progressed and worsened as compared to previous, concerning for worsened multifocal pneumonia. Electronically Signed   By: Jeannine Boga M.D.   On: 07/29/2020 05:47   DG Abd 1 View  Result Date: 08/11/2020 CLINICAL DATA:  OG tube placement EXAM: ABDOMEN - 1 VIEW COMPARISON:  Radiograph 08/01/2020 FINDINGS: Transesophageal tube tip and side port distal to the GE junction, terminating at the level of the gastric antrum/proximal duodenum bulb. Persistent heterogeneous airspace opacities present in the lung bases likely with associated atelectasis and trace effusions. Multiple air-filled loops of small bowel and colon without frank dilatation or high-grade obstructive pattern. No suspicious calcifications. Likely edematous changes in the soft tissues. No acute osseous abnormality. IMPRESSION: 1. Transesophageal tube tip and side port distal to the GE junction, terminating at the level of the gastric antrum/proximal duodenum bulb. 2. Persistent bibasilar heterogeneous airspace opacities likely with atelectasis and trace effusions. Electronically Signed   By: Lovena Le M.D.   On: 08/11/2020 19:22   DG Abd 1 View  Result Date: 08/01/2020 CLINICAL DATA:  NG placement. EXAM: ABDOMEN - 1 VIEW COMPARISON:  Earlier today FINDINGS: Tip and side port of the enteric tube are now located below the diaphragm in the stomach. Similar bowel-gas pattern tear earlier today. IMPRESSION: Tip and side port of the enteric tube below the diaphragm in the stomach. Electronically Signed   By: Keith Rake M.D.   On: 08/01/2020 21:56   DG Abd 1 View  Result  Date: 08/01/2020 CLINICAL DATA:  OG tube placement EXAM: ABDOMEN - 1 VIEW COMPARISON:  07/31/2020 FINDINGS: Esophageal tube tip in the region of GE junction, side-port the level of distal esophagus. Upper abdominal gas pattern is unobstructed. Airspace disease at both bases. IMPRESSION: Esophageal tube tip overlies the GE junction, side-port overlies distal esophagus, consider further advancement by at least 10 cm for more optimal positioning. These results will be called to the ordering clinician or representative by the Radiologist Assistant, and communication documented in the PACS or [employer]. Electronically Signed   By: Donavan Foil M.D.   On: 08/01/2020 20:22   DG Abd 1 View  Result Date: 07/31/2020 CLINICAL DATA:  Orogastric tube placement EXAM: ABDOMEN - 1 VIEW COMPARISON:  July 30, 2020 FINDINGS: Orogastric tube tip is at the gastroesophageal junction. No bowel dilatation or air-fluid level to suggest bowel obstruction.  No free air. IMPRESSION: Orogastric tube tip at gastroesophageal junction. Advise advancing orogastric tube 8-10 cm. No bowel obstruction or free air evident. Electronically Signed   By: Lowella Grip III M.D.   On: 07/31/2020 12:15   DG Abd 1 View  Result Date: 07/30/2020 CLINICAL DATA:  Evaluate enteric tube. EXAM: ABDOMEN - 1 VIEW COMPARISON:  Earlier same day FINDINGS: Enteric tube tip and side port projects over the central upper abdomen. Nonobstructed bowel gas pattern. Osseous structures unremarkable. IMPRESSION: Interval advancement of the enteric tube with the tip and side port projecting over the central upper abdomen. Recommend advancing the tube an additional 5 cm. Electronically Signed   By: Lovey Newcomer M.D.   On: 07/30/2020 16:49   DG Abd 1 View  Result Date: 07/30/2020 CLINICAL DATA:  COVID positive patient.  OG tube evaluation EXAM: ABDOMEN - 1 VIEW COMPARISON:  None. FINDINGS: Enteric tube tip and side-port project within the distal esophagus,  recommend advancement. Patchy airspace opacities lower lungs bilaterally. IMPRESSION: Enteric tube tip and side-port project within the distal esophagus, recommend advancement. These results will be called to the ordering clinician or representative by the Radiologist Assistant, and communication documented in the PACS or [employer]. Electronically Signed   By: Lovey Newcomer M.D.   On: 07/30/2020 13:11   CT ANGIO CHEST PE W OR WO CONTRAST  Result Date: 07/28/2020 CLINICAL DATA:  Respiratory failure. COVID positive. Rule out pulmonary embolism. EXAM: CT ANGIOGRAPHY CHEST WITH CONTRAST TECHNIQUE: Multidetector CT imaging of the chest was performed using the standard protocol during bolus administration of intravenous contrast. Multiplanar CT image reconstructions and MIPs were obtained to evaluate the vascular anatomy. CONTRAST:  192mL OMNIPAQUE IOHEXOL 350 MG/ML SOLN COMPARISON:  Chest 07/24/2020 FINDINGS: Cardiovascular: Excellent pulmonary artery opacification. Negative for pulmonary embolism. Moderate pulmonary artery enlargement compatible with pulmonary artery hypertension. Main pulmonary artery 33 mm in diameter. Cardiac enlargement. No pericardial effusion. Thoracic aorta normal in caliber. No significant opacification of the aorta. Mediastinum/Nodes: Negative for mediastinal mass or adenopathy. Lungs/Pleura: Severe diffuse bilateral airspace disease most prominent in the bases. No pleural effusion. Upper Abdomen: Negative Musculoskeletal: No acute skeletal abnormality. Review of the MIP images confirms the above findings. IMPRESSION: Negative for pulmonary embolism.  Pulmonary artery hypertension. Severe bilateral airspace disease most prominent the bases compatible with COVID pneumonia. Electronically Signed   By: Franchot Gallo M.D.   On: 07/28/2020 12:21   US RENAL  Result Date: 07/25/2020 CLINICAL DATA:  Acute renal failure. EXAM: RENAL / URINARY TRACT ULTRASOUND COMPLETE COMPARISON:  None.  FINDINGS: Right Kidney: Renal measurements: 12.0 x 4.5 x 3.9 cm = volume: 110 mL. Echogenicity within normal limits. No mass or hydronephrosis visualized. Left Kidney: Renal measurements: 12.7 x 5.6 x 4.4 cm = volume: 162 mL. Echogenicity within normal limits. No mass or hydronephrosis visualized. Bladder: Appears normal for degree of bladder distention. Other: None. IMPRESSION: Normal size kidneys without hydronephrosis. Electronically Signed   By: Marin Olp M.D.   On: 07/25/2020 16:40   DG Chest Port 1 View  Result Date: 08/18/2020 CLINICAL DATA:  Aspiration EXAM: PORTABLE CHEST 1 VIEW COMPARISON:  08/11/2020 FINDINGS: Endotracheal tube is no longer present. Unchanged position of left IJ central venous catheter. Bilateral airspace opacities are unchanged. Small pleural effusions. IMPRESSION: Unchanged bilateral airspace opacities. Electronically Signed   By: Ulyses Jarred M.D.   On: 08/18/2020 00:53   DG Chest Port 1 View  Result Date: 08/11/2020 CLINICAL DATA:  64 year old female with respiratory failure.  EXAM: PORTABLE CHEST 1 VIEW COMPARISON:  Chest radiograph dated 08/20/2020. FINDINGS: Endotracheal tube with tip in the proximal right mainstem bronchus. Recommend retraction by approximately 4 cm. Left IJ central venous line in similar position. Bilateral pulmonary densities, right greater left, appears similar to prior radiograph. Small bilateral pleural effusions may be present. No pneumothorax. Stable cardiac silhouette. No acute osseous pathology. IMPRESSION: 1. Endotracheal tube with tip in the proximal right mainstem bronchus. Recommend retraction by approximately 4 cm. 2. No significant interval change in bilateral pulmonary opacities. These results were called by telephone at the time of interpretation on 08/11/2020 at 3:39 pm to provider Adalberto Ill, who verbally acknowledged these results. Electronically Signed   By: Anner Crete M.D.   On: 08/11/2020 15:50   DG Chest Port 1  View  Result Date: 08/10/2020 CLINICAL DATA:  Acute respiratory failure. EXAM: PORTABLE CHEST 1 VIEW COMPARISON:  08/07/2020.  07/31/2020. FINDINGS: Endotracheal tube, NG tube, left IJ line in stable position. Stable cardiomegaly. Multifocal bilateral pulmonary infiltrates are again noted. Small bilateral pleural effusions. No pneumothorax. IMPRESSION: 1. Lines and tubes in stable position. 2. Stable cardiomegaly. 3. Multifocal bilateral pulmonary infiltrates again noted. No interim change. Electronically Signed   By: Marcello Moores  Register   On: 08/10/2020 05:13   DG Chest Port 1 View  Result Date: 08/07/2020 CLINICAL DATA:  COVID-19 pneumonia. EXAM: PORTABLE CHEST 1 VIEW COMPARISON:  Abdominal x-ray dated August 01, 2020. Chest x-ray dated July 31, 2020. FINDINGS: Unchanged endotracheal and enteric tubes. Unchanged left internal jugular central venous catheter. Stable cardiomediastinal silhouette. Poor inspiratory effort compared to the prior study. Peripheral and basilar predominant patchy airspace disease in the right greater than left lungs is not significantly changed. No pneumothorax or pleural effusion. No acute osseous abnormality. IMPRESSION: 1. Unchanged multifocal pneumonia. Electronically Signed   By: Titus Dubin M.D.   On: 08/07/2020 10:00   DG Chest Port 1 View  Result Date: 07/31/2020 CLINICAL DATA:  Orogastric tube placement EXAM: PORTABLE CHEST 1 VIEW COMPARISON:  July 30, 2020. FINDINGS: Endotracheal tube tip is 6.0 cm above carina. Orogastric tube tip is at the gastroesophageal junction. Left jugular catheter tip is in the superior vena cava. No pneumothorax. Patchy airspace opacity is present bilaterally, primarily in the periphery of the upper and lower lung regions, essentially stable. Heart is upper normal in size with pulmonary vascularity normal. No adenopathy. No bone lesions. IMPRESSION: Tube and catheter positions as described without pneumothorax. Note that the  orogastric tube tip is at the gastroesophageal junction. Advise advancing orogastric tube 8-10 cm. Patchy airspace opacity bilaterally, stable. Stable cardiac silhouette. Electronically Signed   By: Lowella Grip III M.D.   On: 07/31/2020 12:14   DG Chest Port 1 View  Result Date: 07/30/2020 CLINICAL DATA:  Worsening COVID pneumonia.  Intubation. EXAM: PORTABLE CHEST 1 VIEW COMPARISON:  07/29/2020 FINDINGS: The endotracheal tube is 4.3 cm above the carina. The left IJ catheter tip is in the distal SVC near the cavoatrial junction. The NG tube is coursing down the esophagus and into the stomach. The tip is in the fundal region and the proximal port is near the GE junction. This could be advanced several cm. Persistent diffuse interstitial and airspace process in the lungs but slight improved aeration after intubation. No pneumothorax. IMPRESSION: 1. Persistent infiltrates but slight improved aeration after intubation. 2. ET tube and left IJ catheter in good position. 3. NG tube tip in the fundal region and proximal port is near the GE junction.  This could be advanced several cm. Electronically Signed   By: Marijo Sanes M.D.   On: 07/30/2020 13:11   ECHOCARDIOGRAM COMPLETE  Result Date: 07/26/2020    ECHOCARDIOGRAM REPORT   Patient Name:   KENNITA PAVLOVICH Date of Exam: 07/25/2020 Medical Rec #:  [id number]      Height:       65.0 in Accession #:    [id number]     Weight:       175.0 lb Date of Birth:  10-Jul-1956      BSA:          1.869 m Patient Age:    27 years       BP:           86/57 mmHg Patient Gender: F              HR:           105 bpm. Exam Location:  [facility] Procedure: 2D Echo, Cardiac Doppler and Color Doppler Indications:     Dyspnea 786.09  History:         Patient has no prior history of Echocardiogram examinations.                  Risk Factors:Hypertension and Dyslipidemia.  Sonographer:     Sherrie Sport RDCS (AE) Referring Phys:  [id number] DWAYNE D CALLWOOD Diagnosing Phys: Yolonda Kida MD   Sonographer Comments: Technically difficult study due to poor echo windows and no apical window. Bandage in left apical space- no apical attempted. IMPRESSIONS  1. Techically difficult study.  2. Left ventricular ejection fraction, by estimation, is 45 to 50%. The left ventricle has mildly decreased function. The left ventricle demonstrates global hypokinesis. Left ventricular diastolic parameters are indeterminate.  3. Right ventricular systolic function is normal. The right ventricular size is normal.  4. The mitral valve is normal in structure. No evidence of mitral valve regurgitation.  5. The aortic valve is normal in structure. Aortic valve regurgitation is not visualized. Conclusion(s)/Recommendation(s): Poor windows for evaluation of left ventricular function by transthoracic echocardiography. Would recommend an alternative means of evaluation. FINDINGS  Left Ventricle: Left ventricular ejection fraction, by estimation, is 45 to 50%. The left ventricle has mildly decreased function. The left ventricle demonstrates global hypokinesis. The left ventricular internal cavity size was normal in size. There is  no left ventricular hypertrophy. Left ventricular diastolic parameters are indeterminate. Right Ventricle: The right ventricular size is normal. No increase in right ventricular wall thickness. Right ventricular systolic function is normal. Left Atrium: Left atrial size was normal in size. Right Atrium: Right atrial size was normal in size. Pericardium: There is no evidence of pericardial effusion. Mitral Valve: The mitral valve is normal in structure. No evidence of mitral valve regurgitation. Tricuspid Valve: The tricuspid valve is normal in structure. Tricuspid valve regurgitation is not demonstrated. Aortic Valve: The aortic valve is normal in structure. Aortic valve regurgitation is not visualized. Pulmonic Valve: The pulmonic valve was normal in structure. Pulmonic valve regurgitation is not  visualized. Aorta: The ascending aorta was not well visualized. IAS/Shunts: No atrial level shunt detected by color flow Doppler. Additional Comments: Techically difficult study.  LEFT VENTRICLE PLAX 2D LVIDd:         3.90 cm LVIDs:         3.47 cm LV PW:         1.44 cm LV IVS:        0.99 cm LVOT diam:  2.20 cm LVOT Area:     3.80 cm  LEFT ATRIUM         Index LA diam:    5.50 cm 2.94 cm/m   AORTA Ao Root diam: 2.90 cm  SHUNTS Systemic Diam: 2.20 cm Dwayne D Callwood MD Electronically signed by Yolonda Kida MD Signature Date/Time: 07/26/2020/5:48:12 PM    Final     Assessment & Plan    Principal Problem:   Sepsis ([place]) Active Problems:   Hypertension   Morbid obesity ([place])   Hypercalcemia   Hyperthyroidism   AKI (acute kidney injury) ([place])   COVID-19 virus infection   Atrial fibrillation with RVR (HCC)   Hypokalemia     1.  Paroxysmal atrial fibrillation with rapid ventricular rate, exacerbated by probable aspiration pneumonia and respiratory failure requiring intubation. She has been on amiodarone drip,   IV digoxin and heparin drip due to aspiration previously.  No obvious aspiration on SLP bedside evaluation.  Patient deferring p.o. intake.  She converted to sinus rhythm at a rate of 90 bpm.Will continue with iv amiodarone, iv digoxin and iv heparin until taking po.     2.  COVID-19 pneumonia/sepsis, extubated, followed by vomiting and probable aspiration pneumonia with recurre nt respiratory failure requiring intubation, now extubated and appears to be doing well.  Patient deferring p.o. intake.   Recommendations   1.  Agree with current therapy 2.  Continue amiodarone infusion however will reduce to 10 mg/h and follow.  SLP note reviewed.  Patient refusing p.o. at present. 3.  Patient deferring p.o. intake so therefore not a candidate for chronic long-term anticoagulation.      Signed, Javier Docker Ariadna Setter MD 08/23/2020, 12:20 PM  Pager: [phone]

## 2020-08-23 NOTE — Progress Notes (Signed)
Physical Therapy Treatment Patient Details Name: Courtney Anderson MRN: [id number] DOB: 1956-07-14 Today's Date: 08/23/2020    History of Present Illness Courtney Anderson is a 15yoF PMH: HTN, hypoTSH, who comes [facility] after 1 weeks of cough, congestion, weakness, intermittent fever chills. Pt admitted with code sepsis, (+) COVID19. Pt having issues with AF/RVR upon arrival.    PT Comments    Pt agreeable to PT session.  Requiring assist for bed mobility but SBA for sitting balance once sitting edge of bed.  Tolerated sitting ex's fairly well.  Attempted to encourage pt to stand but pt declining and requesting back to bed d/t being tired.  Will continue to focus on strengthening and progressive functional mobility per pt tolerance.   Follow Up Recommendations  SNF     Equipment Recommendations   (TBD at next facility)    Recommendations for Other Services       Precautions / Restrictions Precautions Precautions: Fall Precaution Comments: multiple wounds; watch O2 levels; aspiration; L IJ CVC Restrictions Weight Bearing Restrictions: No    Mobility  Bed Mobility Overal bed mobility: Needs Assistance Bed Mobility: Supine to Sit; sit to supine   Supine to sit: Mod assist;Max assist;HOB elevated;+2 for safety/equipment Sit to supine: Mod assist;Max assist;+2 for safety/equipment;HOB elevated   General bed mobility comments: assist for trunk and B LE's; 2 assist to boost pt up in bed end of session (using bed sheets)  Transfers                 General transfer comment: pt declined to attempt standing  Ambulation/Gait                 Stairs             Wheelchair Mobility    Modified Rankin (Stroke Patients Only)       Balance Overall balance assessment: Needs assistance Sitting-balance support: No upper extremity supported;Feet supported Sitting balance-Leahy Scale: Good Sitting balance - Comments: steady sitting reaching within BOS                                     Cognition Arousal/Alertness: Awake/alert Behavior During Therapy: Flat affect Overall Cognitive Status: No family/caregiver present to determine baseline cognitive functioning                                 General Comments: Oriented to person and place but tending to have random statements      Exercises General Exercises - Lower Extremity Ankle Circles/Pumps: AROM;Strengthening;Both;10 reps;Seated Long Arc Quad: AROM;Strengthening;Both;10 reps;Seated Hip Flexion/Marching: AROM;Strengthening;Both;10 reps;Seated (mild hip flexion AROM height B)    General Comments   Nursing cleared pt for participation in physical therapy.  Pt agreeable to PT session.      Pertinent Vitals/Pain Pain Assessment: No/denies pain  Vitals (HR and O2) stable and WFL throughout treatment session.    Home Living Family/patient expects to be discharged to:: Private residence Living Arrangements: Children Available Help at Discharge: Family Type of Home: Mobile home Home Access: Stairs to enter     [place]: None      Prior Function Level of Independence: Independent          PT Goals (current goals can now be found in the care plan section) Acute Rehab PT Goals Patient Stated Goal: to go  home PT Goal Formulation: With patient Time For Goal Achievement: 08/30/20 Potential to Achieve Goals: Fair Progress towards PT goals: Progressing toward goals    Frequency    Min 2X/week      PT Plan Current plan remains appropriate    Co-evaluation              AM-PAC PT "6 Clicks" Mobility   Outcome Measure  Help needed turning from your back to your side while in a flat bed without using bedrails?: Total Help needed moving from lying on your back to sitting on the side of a flat bed without using bedrails?: A Lot Help needed moving to and from a bed to a chair (including a wheelchair)?: Total Help needed standing up from  a chair using your arms (e.g., wheelchair or bedside chair)?: Total Help needed to walk in hospital room?: Total Help needed climbing 3-5 steps with a railing? : Total 6 Click Score: 7    End of Session Equipment Utilized During Treatment: Oxygen Activity Tolerance: Patient tolerated treatment well Patient left: in bed;with call bell/phone within reach;with bed alarm set Nurse Communication: Mobility status;Precautions PT Visit Diagnosis: Other abnormalities of gait and mobility (R26.89);Muscle weakness (generalized) (M62.81)     Time: 6962-9528 PT Time Calculation (min) (ACUTE ONLY): 31 min  Charges:  $Therapeutic Exercise: 8-22 mins $Therapeutic Activity: 8-22 mins                     Leitha Bleak, PT 08/23/20, 5:14 PM

## 2020-08-23 NOTE — Progress Notes (Signed)
   08/23/20 0910  Clinical Encounter Type  Visited With Patient  Visit Type Initial;Spiritual support;Social support  Referral From Chaplain  Consult/Referral To Chaplain  Visited Pt while rounding unit on 2A. Pt was awake and alert. Pt seemed to be happy to see Ch. We engaged in some meaningful conversation. I let the Pt know if see needs to see me again just let her nurse know and I will come back. Ch will follow-up with Pt.

## 2020-08-23 NOTE — Progress Notes (Signed)
PROGRESS NOTE    Courtney Anderson  QMG:[id number] DOB: 11-30-1955 DOA: 07/24/2020 PCP: Guadalupe Maple, MD    Brief Narrative:  Courtney Anderson an 64 y.o.female.  [id number] is a 64 year old female with history noted below, who was admitted to [facility] on 2020-07-24 brought in via EMS as a "code sepsis". Patient subsequently tested positive for COVID-19. She presented with oxygen saturations of 69% on room air and had to be placed on nonrebreather mask to achieve oxygen saturations in the 90s. She was subsequently admitted to the Covid unit for management of COVID-19 pneumonia. She was noted to have acute kidney injury she was treated with remdesivir, zinc, vitamin C and antitussives. She was placed empirically on Rocephin and azithromycin. She continued to have increased FiO2 requirements. She was refusing to self prone. She had atrial fibrillation and was seen by cardiologyto assist in management.   CC: f/u respiratory failure  07/31/20- patient is on 90% FiO2, will modify meds as patient is on metoprolol and levophed as well as cardizem. Will d/c propofol to improve SBP and remove arythmogenic vasopressor if able. Plan to have CVP trending and diuresis today. Goals of care discussion today - patient made DNR per sister and son of patient. I had 2 separate phone meetings with son and separate conversation with sister today to explain patient is worse with shock and renal failure requiring multiple pressors.   08/01/20- patient remains critically ill. FiO2 has been weaned down to 80% today.  08/03/20- patient remains critically ill shes being proned, weaning FiO2 as able.  08/04/20-patient with no interval changes continue with weaning and proning protocol. Continue weaning FiO2 and PEEP as to optimize for SBT 08/06/20- patient is weaned down 40%FiO2 today, will attempt awakening trial with SBT, I spoke to son [name] today, he appreciates update and does not want Elink video view  at this time.  08/07/20- patient is requiring 50%FiO2 today 08/08/20- Continuing to wean vent. FiO2 is 28%, will trial SBT today 08/09/20-tachypneic and hypertensive with SBT. Asynchronous.FiO2 back up to 50% 10/7 severeresp failure, attempt SAT/SBT 10/7 extubated to biPAP 10/8 Tachycardic , more confused,patient re-intubated ETT 8.0 10/9 remains on vent 10/11 severe resp failure on vent 10/12 failed weaning trial 10/12 successfully extubated 10/14 TRANSFER TO TRH 10/15 somnolent, sleepy , not interactive speech and swallow passed, but had aspiration event. At risk for aspiration .  10/16- mentation better today, down to 4L. Son at bedside 10/17- pt in foul mood, upset why she is here. Thinks she has been here for 160 days".  10/18- awake, calmer today, mentating well.  Refusing meds being crushed with applesauce or ice cream as speech had recommended 10/19-pt refusing to eat diet provided. Refused to take po meds crushed with apple sauce. Spoke to son today, told him about this. Also discussed her nutrition status, if she refuses she will need PEG placement. He wants to come by tonight after work to encourage her to take the food and if not, he will think about the PEG.   Consultants:   Pccm, cardiology  Procedures:  Antimicrobials:      Subjective: Pt quite sassy and would not give straight answers to questions.  When asked about pain, pt said she better not have it.     Objective: Vitals:   08/23/20 0415 08/23/20 0804 08/23/20 1102 08/23/20 1617  BP: 139/80 (!) 141/85 124/85 (!) 155/87  Pulse: 66 67 70 66  Resp: 20 19 19 20   Temp: 98.5 F (  36.9 C) 97.7 F (36.5 C) (!) 97.5 F (36.4 C) 97.9 F (36.6 C)  TempSrc: Oral Oral Oral Oral  SpO2: 100% 100% 99% 100%  Weight:      Height:        Intake/Output Summary (Last 24 hours) at 08/23/2020 2005 Last data filed at 08/23/2020 1233 Gross per 24 hour  Intake 2019.24 ml  Output 1925 ml  Net 94.24 ml   Filed Weights     08/12/20 0530 08/21/20 0424 08/22/20 0413  Weight: 109.9 kg 99.2 kg 94.9 kg    Examination: Constitutional: NAD, alert, oriented to self. HEENT: conjunctivae and lids normal, EOMI CV: No cyanosis.   RESP: normal respiratory effort, on O2, turned it down to 3L Extremities: No effusions, edema in BLE SKIN: warm, dry and intact Neuro: II - XII grossly intact.    Foley present.   Data Reviewed: I have personally reviewed following labs and imaging studies  CBC: Recent Labs  Lab 08/19/20 0219 08/19/20 1231 08/20/20 0346 08/21/20 0637 08/22/20 0444  WBC 6.8 5.9 5.6 7.4 8.2  HGB 10.7* 11.3* 10.9* 13.3 12.1  HCT 34.0* 35.8* 34.5* 41.9 38.2  MCV 86.1 86.1 85.8 86.4 86.0  PLT 111* 110* 104* 113* 824   Basic Metabolic Panel: Recent Labs  Lab 08/19/20 0219 08/19/20 0219 08/19/20 1231 08/19/20 1231 08/19/20 2001 08/20/20 0346 08/21/20 0637 08/22/20 0444 08/23/20 0415  NA 136   < > 136  --   --  136 137 139 136  K 3.2*   < > 3.4*   < > 3.5 3.5 3.7 3.2* 3.2*  CL 94*   < > 95*  --   --  96* 95* 96* 98  CO2 27   < > 27  --   --  29 26 28 29   GLUCOSE 85   < > 77  --   --  201* 96 102* 113*  BUN 10   < > 11  --   --  10 8 10 8   CREATININE 1.04*   < > 1.03*  --   --  0.98 1.07* 1.28* 0.98  CALCIUM 10.0   < > 10.1  --   --  9.8 10.5* 11.2* 9.9  MG 1.8   < > 2.3  --   --  1.9 1.9 1.8 2.0  PHOS 2.9  --   --   --   --  2.5 3.1 3.6 2.3*   < > = values in this interval not displayed.   GFR: Estimated Creatinine Clearance: 65.2 mL/min (by C-G formula based on SCr of 0.98 mg/dL). Liver Function Tests: Recent Labs  Lab 08/17/20 0509 08/18/20 0441 08/19/20 0219 08/20/20 0346 08/21/20 0637  AST 28 28 21 21 22   ALT 30 29 26 26 25   ALKPHOS 66 69 64 62 66  BILITOT 1.4* 1.4* 1.3* 0.9 1.0  PROT 6.9 7.1 6.7 6.4* 7.3  ALBUMIN 2.7* 3.0* 2.6* 2.7* 3.2*   No results for input(s): LIPASE, AMYLASE in the last 168 hours. No results for input(s): AMMONIA in the last 168  hours. Coagulation Profile: No results for input(s): INR, PROTIME in the last 168 hours. Cardiac Enzymes: No results for input(s): CKTOTAL, CKMB, CKMBINDEX, TROPONINI in the last 168 hours. BNP (last 3 results) No results for input(s): PROBNP in the last 8760 hours. HbA1C: No results for input(s): HGBA1C in the last 72 hours. CBG: Recent Labs  Lab 08/23/20 0222 08/23/20 0417 08/23/20 0807 08/23/20 1057 08/23/20 1616  GLUCAP  109* 110* 113* 119* 91   Lipid Profile: No results for input(s): CHOL, HDL, LDLCALC, TRIG, CHOLHDL, LDLDIRECT in the last 72 hours. Thyroid Function Tests: No results for input(s): TSH, T4TOTAL, FREET4, T3FREE, THYROIDAB in the last 72 hours. Anemia Panel: No results for input(s): VITAMINB12, FOLATE, FERRITIN, TIBC, IRON, RETICCTPCT in the last 72 hours. Sepsis Labs: No results for input(s): PROCALCITON, LATICACIDVEN in the last 168 hours.  No results found for this or any previous visit (from the past 240 hour(s)).       Radiology Studies: No results found.      Scheduled Meds: . aspirin  81 mg Per Tube Daily  . Chlorhexidine Gluconate Cloth  6 each Topical Daily  . cholecalciferol  2,000 Units Per Tube Daily  . collagenase   Topical Daily  . feeding supplement  237 mL Oral TID BM  . insulin aspart  0-9 Units Subcutaneous Q4H  . mouth rinse  15 mL Mouth Rinse q12n4p  . multivitamin with minerals  1 tablet Per Tube Daily  . nystatin   Topical TID  . pneumococcal 23 valent vaccine  0.5 mL Intramuscular Tomorrow-1000   Continuous Infusions: . sodium chloride 5 mL/hr at 08/23/20 0300  . amiodarone 10 mg/hr (08/23/20 1840)  . dextrose 5 % and 0.45 % NaCl with KCl 20 mEq/L 75 mL/hr at 08/23/20 0915  . famotidine (PEPCID) IV 20 mg (08/23/20 0948)    Assessment & Plan:   Principal Problem:   Sepsis ([place]) Active Problems:   Hypertension   Morbid obesity ([place])   Hypercalcemia   Hyperthyroidism   AKI (acute kidney injury) ([place])    COVID-19 virus infection   Atrial fibrillation with RVR ([place])   Hypokalemia   Pressure injury of skin   Acute hypoxemic respiratory failure due to COVID-19 pneumonia / ARDScomplicated by renal failure-  s/p extubation and re-intubated for acute aspiration pneumonia At risk of aspiration --completed 6 days of Unasyn PLAN: --cont suppl O2, wean as able  Morbid obesity, possible OSA.  -Because medical issues and respiratory status  Needs outpatient sleep study  AFIB WITH RVR- currently in sinus rhythm Cardiology following On IV amiodarone unable to transition to p.o. as patient refusing to take p.o. meds crushed as speech recommended PLAN: --cont amiodarone gtt, per cardiology --d/c heparin gtt, no need for bridging for primary stroke prevention, especially when pt CAN take oral  Hypophosphatemia- --replete PRN  Hypokalemia --replete PRN  Nutrition Patient refusing to eat what is prescribed by speech and also refusing to take her p.o. medications.  Discussed with son that they need to make a decision about her feeding as she has not taken any nutrition.   PLAN: --cont MIVF@75  for now --Need a decision for PEG, or discharge to SNF with palliative   DVT prophylaxis: Lovenox Code Status: Full Code Palliative care following  Family Communication: son updated at the bedside today  Status is: Inpatient  Dispo: The patient is from: Home               Anticipated d/c is to: family refused SNF, needs HH on discharge to home   Anticipated d/c date is: 3 days  Patient currently is not medically stable to d/c. needs transitioning to po medications. Needs nutrition, PEG placement pending as son is thinking about it.      LOS: 30 days    Enzo Bi, MD [facility] Pager [phone]

## 2020-08-23 NOTE — Progress Notes (Addendum)
[place] for Electrolyte Monitoring and Replacement   Recent Labs: Potassium (mmol/L)  Date Value  08/23/2020 3.2 (L)   Magnesium (mg/dL)  Date Value  08/23/2020 2.0   Calcium (mg/dL)  Date Value  08/23/2020 9.9   Calcium, Total (PTH) (mg/dL)  Date Value  07/24/2020 10.1   Albumin (g/dL)  Date Value  08/21/2020 3.2 (L)  10/22/2019 4.2   Phosphorus (mg/dL)  Date Value  08/23/2020 2.3 (L)   Sodium (mmol/L)  Date Value  08/23/2020 136  10/22/2019 139   Assessment: 64 year old female with PMHx of HTN, HLD, primary hyperparathyroidism who was admitted with COVID-19 PNA, also with A-fib with RVR and severe intertrigo. Patient extubated 10/12. Now on 4L [place].   Patient was NPO secondary to aspiration events. Patient seen by SLP today. Cleared patient to take medications crushed in puree. Dysphagia 1 diet has been ordered. Patient has converted to NSR. Continues on IV digoxin and IV amiodarone. Patient is refusing PO medications at this time.   Patient noted to have QTc > 600 on bedside telemetry 10/19 AM. Will need to monitor electrolytes closely. Last digoxin level was 1 on 10/15. Patient also with primary hyperparathyroidism and associated hypercalcemia. Will need to be cautious with digoxin.    MIVF: D5-1/2 NS at 75 mL/hr Diuresis: IV Lasix 20 mg BID (d/c 10/18) UOP: 2075 mL yesterday  Goal of Therapy:  Potassium 4.0 - 5.1 mmol/L Magnesium 2.0 - 2.4 mg/dL All Other Electrolytes WNL  Plan:  10/20  K 3.2  Mag 2.0  Phos 2.3 Scr 0.98  Potassium low. Will order IV KCl 10 mEq x 4 and change MIVF to D5-1/2 NS + 20 mEq K/L at 75 mL/hr (36 mEq K/day)  Phosphorous mildly low suspect secondary to hyperparathyroidism / poor PO intake. Will re-check tomorrow  Patient refusing PO medications at this time  Benita Gutter 08/23/2020 7:24 AM

## 2020-08-23 NOTE — Consult Note (Signed)
[place] Nurse wound follow up Wound type: Unstageable pressure injury to coccygeal area (between buttocks at gluteal cleft). Also wound at right buttocks, Stage 3. Wound care continues for abdominal wounds present on admission secondary to intertriginous moisture collection with no significant change in presentation from last Friday. Measurement:  Coccygeal: Unstageable PI: 5.2cm x 5cm with wound depth unable to be determined due to the presence of firmly adherent white nonviable tissue (slough) Left buttock: Stage 3 PI: 1.5cm round x 0.2cm with red, moist wound bed. Wound bed: As described above Drainage (amount, consistency, odor) small amount light yellow exudate noted on old dressing consistent with autolytically debriding nonviable tissue Periwound: intact, moist. Patient has been incontinent of stool at the time of my visit. Dressing procedure/placement/frequency: Patient is repositioned in bed and cleansed following an episode of fecal incontinence with the assistance of her bedside nurse, bedside technician and this Probation officer; their assistance is appreciated. An air mattress will be added today as well as Prevalon pressure redistribution heel boots and a pressure redistribution chair cushion for OOB use (PT is starting to work with her on side of bed sitting today). Wound care using the enzymatic debriding agent collagenase (Santyl) and a saline moistened gauze dressing to the Unstageable PI at the coccygeal area will continue, this is topped with a silicone foam dressing.  [facility] nursing team will follow, seeing every 7-10 days and will remain available to this patient, the nursing and medical teams.   Thanks, Maudie Flakes, MSN, RN, [place], Arther Abbott  Pager# [phone]

## 2020-08-23 NOTE — Progress Notes (Signed)
[place] for Heparin (from Eliquis) Indication: atrial fibrillation  Patient Measurements: Height: 5\' 5"  (165.1 cm) Weight: 94.9 kg (209 lb 4.8 oz) IBW/kg (Calculated) : 57 HEPARIN DW (KG): 73.7  Labs: Recent Labs    08/21/20 0637 08/22/20 0444 08/23/20 0415  HGB 13.3 12.1  --   HCT 41.9 38.2  --   PLT 113* 150  --   HEPARINUNFRC 0.47 0.24* 0.45  CREATININE 1.07* 1.28* 0.98   Estimated Creatinine Clearance: 65.2 mL/min (by C-G formula based on SCr of 0.98 mg/dL).  Medical History: Past Medical History:  Diagnosis Date  . Allergy   . Elevated transaminase level   . Hyperlipidemia   . Hypertension   . Obesity    Assessment: Patient is a 64 y/o F with medical history including hypertension, hypothyroidism who is admitted with acute respiratory failure secondary to pneumonia. Admission complicated by atrial fibrillation with RVR. Pharmacy has been consulted to initiate heparin infusion for stroke prophylaxis in the setting of atrial fibrillation.   Patient was initially initiated on heparin infusion which was converted to PO anticoagulation with apixaban. Patient was then switched back to heparin infusion due to aspiration events. Last Eliquis 5mg  dose was 10/12 at 0900. SLP saw patient yesterday and recommended medication administration as crushed with puree. However, patient refusing to take medications crushed in applesauce.   Goal of Therapy:  HL 0.3 - 0.7 Monitor platelets by anticoagulation protocol: Yes   Plan:  --10/20 at 0415 HL = 0.45, therapeutic. Will continue heparin infusion at 1200 units/hr --Will re-check HL/CBC tomorrow AM  --Cardiology recommending converting anticoagulation back to apixaban when patient is able to swallow safely. Patient cleared by SLP yesterday for medications crushed with puree but patient is refusing PO medications at this time  Benita Gutter 08/23/2020,7:21 AM

## 2020-08-23 NOTE — Progress Notes (Signed)
Occupational Therapy Treatment Patient Details Name: Courtney Anderson MRN: [id number] DOB: 1955-12-05 Today's Date: 08/23/2020    History of present illness Courtney Anderson is a 8yoF PMH: HTN, hypoTSH, who comes to [facility] on 9/20 after 1 weeks of cough, congestion, weakness, intermittent fever chills. Pt admitted with code sepsis, (+) COVID19. Pt having issues with AF/RVR upon arrival.   OT comments  [name] showed considerable improvement from previous OT sessions this week. She was more alert, more oriented, made many fewer tangential or nonsensical comments. Able to come from supine<>EOB sitting with ModA +1 (compared to MaxA +2 two days ago). Sat EOB, engaging in therex with BUE, no LOB, for ~ 5 minutes before stating she was tired. Pt able to raise BLE back to bed without physical assist from therapist. Pt left in bed with call bell within reach and alarm activated. [name] continues to make good progress and remains appropriate for ongoing skilled OT services while hospitalized. Based on her high level of debilitation, however, continue to recommend SNF for additional rehab prior to returning to her home.   Follow Up Recommendations  SNF    Equipment Recommendations       Recommendations for Other Services      Precautions / Restrictions Precautions Precautions: Fall Precaution Comments: multiple wounds; watch O2 levels Restrictions Weight Bearing Restrictions: No       Mobility Bed Mobility Overal bed mobility: Needs Assistance   Rolling: Mod assist   Supine to sit: Mod assist Sit to supine: Mod assist   General bed mobility comments: Mod A + 1 coming to sitting EOB  Transfers                 General transfer comment: standing not attempted today, 2/2 weakness    Balance Overall balance assessment: Needs assistance Sitting-balance support: Feet supported;Single extremity supported Sitting balance-Leahy Scale: Fair Sitting balance - Comments: MinA for sitting  balance                                   ADL either performed or assessed with clinical judgement   ADL Overall ADL's : Needs assistance/impaired                                       General ADL Comments: Much more engaged today. Able to hold/operate telephone, which she could not do yesterday     Vision Patient Visual Report: No change from baseline Additional Comments: Reports that she cannot find her eyeglasses   Perception     Praxis      Cognition Arousal/Alertness: Awake/alert Behavior During Therapy: Flat affect Overall Cognitive Status: Impaired/Different from baseline                                 General Comments: Mostly on point today, a few nonsensical statements        Exercises Other Exercises Other Exercises: cognitive re-orientation; bed level therex   Shoulder Instructions       General Comments      Pertinent Vitals/ Pain       Pain Assessment: No/denies pain  Home Living Family/patient expects to be discharged to:: Private residence Living Arrangements: Children Available Help at Discharge: Family Type of Home: Mobile home Home Access:  Stairs to enter [employer] of Steps: 6                   Home Equipment: None          Prior Functioning/Environment Level of Independence: Independent            Frequency  Min 2X/week        Progress Toward Goals  OT Goals(current goals can now be found in the care plan section)     Acute Rehab OT Goals Patient Stated Goal: return to home, feel better OT Goal Formulation: With patient Time For Goal Achievement: 08/31/20 Potential to Achieve Goals: Good  Plan      Co-evaluation                 AM-PAC OT "6 Clicks" Daily Activity     Outcome Measure   Help from another person eating meals?: A Little Help from another person taking care of personal grooming?: A Lot Help from another person toileting, which  includes using toliet, bedpan, or urinal?: Total Help from another person bathing (including washing, rinsing, drying)?: Total Help from another person to put on and taking off regular upper body clothing?: A Lot Help from another person to put on and taking off regular lower body clothing?: Total 6 Click Score: 10    End of Session    OT Visit Diagnosis: Muscle weakness (generalized) (M62.81)   Activity Tolerance Patient tolerated treatment well;Patient limited by fatigue   Patient Left in bed;with bed alarm set;with call bell/phone within reach   Nurse Communication          Time: 1572-6203 OT Time Calculation (min): 23 min  Charges: OT General Charges $OT Visit: 1 Visit OT Treatments $Self Care/Home Management : 8-22 mins $Therapeutic Exercise: 8-22 mins   Josiah Lobo, PhD, [place], OTR/L ascom [phone] 08/23/20, 4:20 PM

## 2020-08-23 NOTE — Progress Notes (Signed)
Daily Progress Note   Patient Name: Courtney Anderson       Date: 08/23/2020 DOB: [birth year]  Age: 64 y.o. MRN#: [id number] Attending Physician: Enzo Bi, MD Primary Care Physician: Guadalupe Maple, MD Admit Date: 07/24/2020  Reason for Consultation/Follow-up: Establishing goals of care  Subjective: Patient is awake, alert and oriented x3. Much more clearer in her conversations today. Denies pain but states her sacrum hurts at times. No acute distress noted. She asked for assistance finding her remote to change the channel so she can watch her soap operas.   Patient in much better mood today. Smiling and seems cheerful during my visit. I discussed with her about her poor appetite and again about need for PEG tube if this did not improve. Patient states she would not want a feeding tube but would leave final decisions to her sons Delmus and DJ. She states she is hopeful she can return home with the report of her sons.   I discussed at length with Ms. Sumpter concerns for her poor oral intake. Patient states "I will never be eating that mess!" She asked who made the decision to give her pureed food, stating she couldn't wait to get out of here and get some fried chicken. She states the food is like she is being forced to eat dog food or baby food and that is not the way that she eats. I attempted to explain why she was requiring puree foods due to previous aspiration events and continued high risk. Patient states "that was last week, I am better now!" Education provided that for her safety we would need to continue with aspiration precautions and Pureed diet until otherwise evaluated and different recommendations have been provided. Patient verbalized her dislike and verbalized "yall will be upset with me because I won't be eating this and I guess I will starve until I get out of here!"   Patient requesting to speak with her son. I attempted to call him from my phone at the bedside. We also attempted to  contact him from her phone. He did not answer either call.   Length of Stay: 30 days  Vital Signs: BP 124/85 (BP Location: Left Arm)   Pulse 70   Temp (!) 97.5 F (36.4 C) (Oral)   Resp 19   Ht 5\' 5"  (1.651 m)   Wt 94.9 kg   SpO2 99%   BMI 34.83 kg/m  SpO2: SpO2: 99 % O2 Device: O2 Device: Nasal Cannula O2 Flow Rate: O2 Flow Rate (L/min): 4 L/min  Physical Exam: NAD, awake much more alert today. Normal breathing pattern      Palliative Care Assessment & Plan   Code Status:  Full code  Goals of Care/Recommendations:  Continue with current plan of care per medical team  Patient continues to have poor p.o. intake. She is much more alert today and insist she will not be eating pureed food as this is not the type of food she is used to eating. I attempted to encourage her to at least try it and eat some and if she seemed to be doing fairly well we could re-evaluate her diet in a few days. Patient declined expressing she would never eat pureed food.   Multiple attempts to contact son at the bedside from my personal phone and patient's cell phone.  Voicemail left.  PMT will continue to support and follow as needed  Prognosis: Guarded  Discharge Planning: To Be Determined  Thank you for  allowing the Palliative Medicine Team to assist in the care of this patient.  Time Total: 35 min. .   Visit consisted of counseling and education dealing with the complex and emotionally intense issues of symptom management and palliative care in the setting of serious and potentially life-threatening illness.Greater than 50%  of this time was spent counseling and coordinating care related to the above assessment and plan.  Alda Lea, AGPCNP-BC  Palliative Medicine Team [phone]

## 2020-08-24 DIAGNOSIS — I10 Essential (primary) hypertension: Secondary | ICD-10-CM | POA: Diagnosis not present

## 2020-08-24 DIAGNOSIS — A419 Sepsis, unspecified organism: Secondary | ICD-10-CM | POA: Diagnosis not present

## 2020-08-24 DIAGNOSIS — I4891 Unspecified atrial fibrillation: Secondary | ICD-10-CM | POA: Diagnosis not present

## 2020-08-24 DIAGNOSIS — U071 COVID-19: Secondary | ICD-10-CM | POA: Diagnosis not present

## 2020-08-24 LAB — CBC
HCT: 39.6 % (ref 36.0–46.0)
Hemoglobin: 12.6 g/dL (ref 12.0–15.0)
MCH: 27.4 pg (ref 26.0–34.0)
MCHC: 31.8 g/dL (ref 30.0–36.0)
MCV: 86.1 fL (ref 80.0–100.0)
Platelets: 148 10*3/uL — ABNORMAL LOW (ref 150–400)
RBC: 4.6 MIL/uL (ref 3.87–5.11)
RDW: 16.1 % — ABNORMAL HIGH (ref 11.5–15.5)
WBC: 6.6 10*3/uL (ref 4.0–10.5)
nRBC: 0.3 % — ABNORMAL HIGH (ref 0.0–0.2)

## 2020-08-24 LAB — BASIC METABOLIC PANEL
Anion gap: 12 (ref 5–15)
BUN: 7 mg/dL — ABNORMAL LOW (ref 8–23)
CO2: 24 mmol/L (ref 22–32)
Calcium: 10.4 mg/dL — ABNORMAL HIGH (ref 8.9–10.3)
Chloride: 101 mmol/L (ref 98–111)
Creatinine, Ser: 0.99 mg/dL (ref 0.44–1.00)
GFR, Estimated: 60 mL/min — ABNORMAL LOW (ref >60)
Glucose, Bld: 110 mg/dL — ABNORMAL HIGH (ref 70–99)
Potassium: 3.3 mmol/L — ABNORMAL LOW (ref 3.5–5.1)
Sodium: 137 mmol/L (ref 135–145)

## 2020-08-24 LAB — MAGNESIUM: Magnesium: 1.9 mg/dL (ref 1.7–2.4)

## 2020-08-24 LAB — GLUCOSE, CAPILLARY
Glucose-Capillary: 104 mg/dL — ABNORMAL HIGH (ref 70–99)
Glucose-Capillary: 88 mg/dL (ref 70–99)
Glucose-Capillary: 89 mg/dL (ref 70–99)
Glucose-Capillary: 93 mg/dL (ref 70–99)
Glucose-Capillary: 94 mg/dL (ref 70–99)
Glucose-Capillary: 96 mg/dL (ref 70–99)

## 2020-08-24 LAB — PHOSPHORUS: Phosphorus: 2.4 mg/dL — ABNORMAL LOW (ref 2.5–4.6)

## 2020-08-24 MED ORDER — VITAMIN D 25 MCG (1000 UNIT) PO TABS
2000.0000 [IU] | ORAL_TABLET | Freq: Every day | ORAL | Status: DC
  Administered 2020-08-25 – 2020-08-28 (×4): 2000 [IU] via ORAL
  Filled 2020-08-24 (×4): qty 2

## 2020-08-24 MED ORDER — ACETAMINOPHEN 325 MG PO TABS
650.0000 mg | ORAL_TABLET | Freq: Four times a day (QID) | ORAL | Status: DC | PRN
  Administered 2020-08-27: 650 mg via ORAL
  Filled 2020-08-24: qty 2

## 2020-08-24 MED ORDER — POTASSIUM CHLORIDE 10 MEQ/100ML IV SOLN
10.0000 meq | INTRAVENOUS | Status: AC
  Administered 2020-08-24 (×4): 10 meq via INTRAVENOUS
  Filled 2020-08-24 (×4): qty 100

## 2020-08-24 MED ORDER — ADULT MULTIVITAMIN W/MINERALS CH
1.0000 | ORAL_TABLET | Freq: Every day | ORAL | Status: DC
  Administered 2020-08-25 – 2020-08-28 (×4): 1 via ORAL
  Filled 2020-08-24 (×4): qty 1

## 2020-08-24 MED ORDER — ONDANSETRON HCL 4 MG PO TABS: 4.0000 mg | ORAL_TABLET | Freq: Four times a day (QID) | ORAL | Status: DC | PRN

## 2020-08-24 MED ORDER — AMIODARONE HCL 200 MG PO TABS
200.0000 mg | ORAL_TABLET | Freq: Every day | ORAL | Status: DC
  Administered 2020-08-25 – 2020-08-28 (×4): 200 mg via ORAL
  Filled 2020-08-24 (×4): qty 1

## 2020-08-24 MED ORDER — ACETAMINOPHEN 650 MG RE SUPP: 650.0000 mg | Freq: Four times a day (QID) | RECTAL | Status: DC | PRN

## 2020-08-24 MED ORDER — GUAIFENESIN-DM 100-10 MG/5ML PO SYRP
10.0000 mL | ORAL_SOLUTION | ORAL | Status: DC | PRN
  Administered 2020-08-25 – 2020-08-28 (×5): 10 mL via ORAL
  Filled 2020-08-24 (×7): qty 10

## 2020-08-24 MED ORDER — ASPIRIN 81 MG PO CHEW
81.0000 mg | CHEWABLE_TABLET | Freq: Every day | ORAL | Status: DC
  Administered 2020-08-25 – 2020-08-28 (×4): 81 mg via ORAL
  Filled 2020-08-24 (×4): qty 1

## 2020-08-24 MED ORDER — ENOXAPARIN SODIUM 40 MG/0.4ML ~~LOC~~ SOLN
40.0000 mg | SUBCUTANEOUS | Status: DC
  Administered 2020-08-24 – 2020-08-25 (×2): 40 mg via SUBCUTANEOUS
  Filled 2020-08-24 (×2): qty 0.4

## 2020-08-24 MED ORDER — OCUVITE-LUTEIN PO CAPS
1.0000 | ORAL_CAPSULE | Freq: Every day | ORAL | Status: DC
  Administered 2020-08-25 – 2020-08-27 (×3): 1 via ORAL
  Filled 2020-08-24 (×4): qty 1

## 2020-08-24 NOTE — Care Management Important Message (Signed)
Important Message  Patient Details  Name: Courtney Anderson MRN: [id number] Date of Birth: 03/30/1956   Medicare Important Message Given:  Yes     Dannette Barbara 08/24/2020, 1:38 PM

## 2020-08-24 NOTE — Progress Notes (Signed)
[place] for Electrolyte Monitoring and Replacement   Recent Labs: Potassium (mmol/L)  Date Value  08/24/2020 3.3 (L)   Magnesium (mg/dL)  Date Value  08/24/2020 1.9   Calcium (mg/dL)  Date Value  08/24/2020 10.4 (H)   Calcium, Total (PTH) (mg/dL)  Date Value  07/24/2020 10.1   Albumin (g/dL)  Date Value  08/21/2020 3.2 (L)  10/22/2019 4.2   Phosphorus (mg/dL)  Date Value  08/24/2020 2.4 (L)   Sodium (mmol/L)  Date Value  08/24/2020 137  10/22/2019 139   Assessment: 64 year old female with PMHx of HTN, HLD, primary hyperparathyroidism who was admitted with COVID-19 PNA, also with A-fib with RVR and severe intertrigo. Patient extubated 10/12. Now on 4L [place].   Patient was NPO secondary to aspiration events. Patient seen by SLP today. Cleared patient to take medications crushed in puree. Dysphagia 1 diet has been ordered. Patient has converted to NSR. Continues on IV amiodarone. Digoxin discontinued by cardiology yesterday. Patient is refusing PO medications at this time.   MIVF: D5-1/2 NS + 20 mEq K/L at 75 mL/hr Diuresis: IV Lasix 20 mg BID (d/c 10/18) UOP: 2050 mL yesterday  Goal of Therapy:  Potassium 4.0 - 5.1 mmol/L Magnesium 2.0 - 2.4 mg/dL All Other Electrolytes WNL  Plan:  10/21  K 3.3  Mag 1.9  Phos 2.4 Scr 0.99  Potassium low. Will order IV KCl 10 mEq x 4 and continue MIVF to D5-1/2 NS + 20 mEq K/L at 75 mL/hr (36 mEq K/day)  Phosphorous mildly low again today / slightly improved. Suspect secondary to hyperparathyroidism / poor PO intake. Will re-check tomorrow  Patient refusing PO medications at this time  Benita Gutter 08/24/2020 7:49 AM

## 2020-08-24 NOTE — Progress Notes (Signed)
PROGRESS NOTE    Courtney Anderson  RKY:[id number] DOB: 1956/07/27 DOA: 07/24/2020 PCP: Courtney Maple, MD    Brief Narrative:  Courtney Anderson an 64 y.o.female.  BTD:[id number] is a 64 year old female with history noted below, who was admitted to [facility] on 2020-07-24 brought in via EMS as a "code sepsis". Patient subsequently tested positive for COVID-19. She presented with oxygen saturations of 69% on room air and had to be placed on nonrebreather mask to achieve oxygen saturations in the 90s. She was subsequently admitted to the Covid unit for management of COVID-19 pneumonia. She was noted to have acute kidney injury she was treated with remdesivir, zinc, vitamin C and antitussives. She was placed empirically on Rocephin and azithromycin. She continued to have increased FiO2 requirements. She was refusing to self prone. She had atrial fibrillation and was seen by cardiologyto assist in management.  07/31/20- patient is on 90% FiO2, will modify meds as patient is on metoprolol and levophed as well as cardizem. Will d/c propofol to improve SBP and remove arythmogenic vasopressor if able. Plan to have CVP trending and diuresis today. Goals of care discussion today - patient made DNR per sister and son of patient. I had 2 separate phone meetings with son and separate conversation with sister today to explain patient is worse with shock and renal failure requiring multiple pressors.   08/01/20- patient remains critically ill. FiO2 has been weaned down to 80% today.  08/03/20- patient remains critically ill shes being proned, weaning FiO2 as able.  08/04/20-patient with no interval changes continue with weaning and proning protocol. Continue weaning FiO2 and PEEP as to optimize for SBT 08/06/20- patient is weaned down 40%FiO2 today, will attempt awakening trial with SBT, I spoke to son [name] today, he appreciates update and does not want Elink video view at this time.  08/07/20- patient is  requiring 50%FiO2 today 08/08/20- Continuing to wean vent. FiO2 is 28%, will trial SBT today 08/09/20-tachypneic and hypertensive with SBT. Asynchronous.FiO2 back up to 50% 10/7 severeresp failure, attempt SAT/SBT 10/7 extubated to biPAP 10/8 Tachycardic , more confused,patient re-intubated ETT 8.0 10/9 remains on vent 10/11 severe resp failure on vent 10/12 failed weaning trial 10/12 successfully extubated 10/14 TRANSFER TO TRH 10/15 somnolent, sleepy , not interactive speech and swallow passed, but had aspiration event. At risk for aspiration .  10/16- mentation better today, down to 4L. Son at bedside 10/17- pt in foul mood, upset why she is here. Thinks she has been here for 160 days".  10/18- awake, calmer today, mentating well.  Refusing meds being crushed with applesauce or ice cream as speech had recommended 10/19-pt refusing to eat diet provided. Refused to take po meds crushed with apple sauce. Spoke to son today, told him about this. Also discussed her nutrition status, if she refuses she will need PEG placement. He wants to come by tonight after work to encourage her to take the food and if not, he will think about the PEG.  10/21-patient agreed to try food from home as she does not like hospital food.  She was asking for fried chicken and chips.  Encouraged to try stool and smoothies before fried food.  Patient agrees to give it a try.  Remained stable on room air.  Consultants:   Pccm, cardiology  Procedures:  Antimicrobials:   Completed the course of antibiotics.  Subjective: Patient wants to go home.  Discussed that it is important to eat otherwise she can deteriorate and might  required PEG tube placement for feeding.  Patient does not like pured food and asking for regular food.  After long discussion she agrees to try soups and smoothies which will be brought from home.  Objective: Vitals:   08/24/20 0414 08/24/20 0700 08/24/20 1130 08/24/20 1508  BP: 138/76  130/68 (!) 155/80 (!) 155/79  Pulse: 72 63 66 69  Resp: 20 18 20 19   Temp: 97.6 F (36.4 C) 98.5 F (36.9 C) 97.9 F (36.6 C) 98 F (36.7 C)  TempSrc: Oral Oral Oral Oral  SpO2: 98% 100% 98% 94%  Weight: 97.3 kg     Height:        Intake/Output Summary (Last 24 hours) at 08/24/2020 1752 Last data filed at 08/24/2020 1511 Gross per 24 hour  Intake 1693.01 ml  Output 2800 ml  Net -1106.99 ml   Filed Weights   08/21/20 0424 08/22/20 0413 08/24/20 0414  Weight: 99.2 kg 94.9 kg 97.3 kg    Examination: Constitutional: NAD, alert, oriented to self. HEENT: conjunctivae and lids normal, EOMI CV: No cyanosis.   RESP: normal respiratory effort, on O2, turned it down to 3L Extremities: No effusions, edema in BLE SKIN: warm, dry and intact Neuro: II - XII grossly intact.    Foley present.   Data Reviewed: I have personally reviewed following labs and imaging studies  CBC: Recent Labs  Lab 08/19/20 1231 08/20/20 0346 08/21/20 0637 08/22/20 0444 08/24/20 0540  WBC 5.9 5.6 7.4 8.2 6.6  HGB 11.3* 10.9* 13.3 12.1 12.6  HCT 35.8* 34.5* 41.9 38.2 39.6  MCV 86.1 85.8 86.4 86.0 86.1  PLT 110* 104* 113* 150 440*   Basic Metabolic Panel: Recent Labs  Lab 08/20/20 0346 08/21/20 0637 08/22/20 0444 08/23/20 0415 08/24/20 0540  NA 136 137 139 136 137  K 3.5 3.7 3.2* 3.2* 3.3*  CL 96* 95* 96* 98 101  CO2 29 26 28 29 24   GLUCOSE 201* 96 102* 113* 110*  BUN 10 8 10 8  7*  CREATININE 0.98 1.07* 1.28* 0.98 0.99  CALCIUM 9.8 10.5* 11.2* 9.9 10.4*  MG 1.9 1.9 1.8 2.0 1.9  PHOS 2.5 3.1 3.6 2.3* 2.4*   GFR: Estimated Creatinine Clearance: 65.4 mL/min (by C-G formula based on SCr of 0.99 mg/dL). Liver Function Tests: Recent Labs  Lab 08/18/20 0441 08/19/20 0219 08/20/20 0346 08/21/20 0637  AST 28 21 21 22   ALT 29 26 26 25   ALKPHOS 69 64 62 66  BILITOT 1.4* 1.3* 0.9 1.0  PROT 7.1 6.7 6.4* 7.3  ALBUMIN 3.0* 2.6* 2.7* 3.2*   No results for input(s): LIPASE, AMYLASE in  the last 168 hours. No results for input(s): AMMONIA in the last 168 hours. Coagulation Profile: No results for input(s): INR, PROTIME in the last 168 hours. Cardiac Enzymes: No results for input(s): CKTOTAL, CKMB, CKMBINDEX, TROPONINI in the last 168 hours. BNP (last 3 results) No results for input(s): PROBNP in the last 8760 hours. HbA1C: No results for input(s): HGBA1C in the last 72 hours. CBG: Recent Labs  Lab 08/24/20 0045 08/24/20 0429 08/24/20 0850 08/24/20 1125 08/24/20 1637  GLUCAP 89 104* 88 96 94   Lipid Profile: No results for input(s): CHOL, HDL, LDLCALC, TRIG, CHOLHDL, LDLDIRECT in the last 72 hours. Thyroid Function Tests: No results for input(s): TSH, T4TOTAL, FREET4, T3FREE, THYROIDAB in the last 72 hours. Anemia Panel: No results for input(s): VITAMINB12, FOLATE, FERRITIN, TIBC, IRON, RETICCTPCT in the last 72 hours. Sepsis Labs: No results for input(s): PROCALCITON, LATICACIDVEN  in the last 168 hours.  No results found for this or any previous visit (from the past 240 hour(s)).    Radiology Studies: No results found.  Artery  Total 8 minutes about discharge summary that the primary vascular B.  Medical metabolic  Scheduled Meds:  [START ON 08/25/2020] amiodarone  200 mg Oral Daily   [START ON 08/25/2020] aspirin  81 mg Oral Daily   Chlorhexidine Gluconate Cloth  6 each Topical Daily   [START ON 08/25/2020] cholecalciferol  2,000 Units Oral Daily   collagenase   Topical Daily   enoxaparin (LOVENOX) injection  40 mg Subcutaneous Q24H   insulin aspart  0-9 Units Subcutaneous Q4H   mouth rinse  15 mL Mouth Rinse q12n4p   [START ON 08/25/2020] multivitamin with minerals  1 tablet Oral Daily   [START ON 08/25/2020] multivitamin-lutein  1 capsule Oral Daily   nystatin   Topical TID   pneumococcal 23 valent vaccine  0.5 mL Intramuscular Tomorrow-1000   Continuous Infusions:  sodium chloride 5 mL/hr at 08/23/20 0300   dextrose 5 % and 0.45  % NaCl with KCl 20 mEq/L 75 mL/hr at 08/24/20 0924   famotidine (PEPCID) IV 20 mg (08/24/20 1000)    Assessment & Plan:   Principal Problem:   Sepsis ([place]) Active Problems:   Hypertension   Morbid obesity ([place])   Hypercalcemia   Hyperthyroidism   AKI (acute kidney injury) ([place])   COVID-19 virus infection   Atrial fibrillation with RVR ([place])   Hypokalemia   Pressure injury of skin   Acute hypoxemic respiratory failure due to COVID-19 pneumonia / ARDScomplicated by renal failure-resolved  s/p extubation and re-intubated for acute aspiration pneumonia At risk of aspiration, completed the course of Unasyn. Saturating well on room air now.  Morbid obesity, possible OSA.  -Because medical issues and respiratory status  Needs outpatient sleep study  AFIB WITH RVR- currently in sinus rhythm Cardiology following. Patient is currently on IV amiodarone which can be transitioned to p.o. once start taking some oral intake which she promised to do it today.  Hypophosphatemia- --replete PRN  Hypokalemia --replete PRN  Nutrition Patient agrees to try some soups and smoothies before jumping to regular food but it has to be from her home as she does not like hospital diet. High risk for refeeding syndrome. -Monitor electrolytes along with magnesium and phosphorus once started food.  Foley catheter.  Foley catheter was placed when patient was in ICU.  Remained in as she developed pressure injuries in groin areas. Discussed with wound care nurse and will try taking Foley out and place pure wick for healing.  If patient continued to swell area as he also has an history of incontinence we might have to replace Foley catheter for wound healing.  Central line.  Patient continued to have central line in place. We will try taking it out once started taking p.o.  DVT prophylaxis: Lovenox Code Status: Full Code Palliative care following  Family Communication: son updated on  phone.  Status is: Inpatient  Dispo: The patient is from: Home               Anticipated d/c is to: family refused SNF, needs HH on discharge to home   Anticipated d/c date is: 3 days  Patient currently is not medically stable to d/c. needs transitioning to po medications. Needs nutrition.     LOS: 31 days    Lorella Nimrod, MD [facility] Pager [phone]

## 2020-08-24 NOTE — Progress Notes (Signed)
Pt refusing afternoon lunch tray. States she is going to wait for her son to eat. States he is going to bring her some soup Pt states she is not going to eat the food the hospital sent her. Offered patient some chips that her son left for her and patient refused as well.

## 2020-08-24 NOTE — Progress Notes (Signed)
Nutrition Follow-up  DOCUMENTATION CODES:   Obesity unspecified  INTERVENTION:   D/c Ensure Enlive as pt is refusing it   Magic cup TID with meals, each supplement provides 290 kcal and 9 grams of protein  Ocuvite daily for wound healing (provides zinc, vitamin A, vitamin C, Vitamin E, copper, and selenium)  MVI daily   Pt at high refeed risk; recommend monitor potassium, magnesium and phosphorus labs daily as oral intake improves.   NUTRITION DIAGNOSIS:   Inadequate oral intake related to inability to eat as evidenced by NPO status. -pt advanced to dysphagia 1 diet   GOAL:   Patient will meet greater than or equal to 90% of their needs -not met   MONITOR:   PO intake, Supplement acceptance, Labs, Weight trends, Skin, I & O's  ASSESSMENT:   64 year old female with PMHx of HTN, HLD who was admitted with COVID-19 PNA, also with A-fib with RVR and severe intertrigo.   Pt continues to have poor oral intake in hospital; pt is refusing the hospital food and is unhappy about the pureed diet. Pt is eating some food brought in from home by her son. Pt is refusing all of her Ensure supplements. SLP is following. Pt and son have both been spoken to about PEG tube placement and are considering their options. RD will discontinue Ensure as pt is refusing it. RD will add Magic Cups to meal trays and add ocuvite to support wound healing. Pt is currently refeeding; recommend monitor electrolytes until stable. Per chart, pt is down ~26lbs in hospital. RD will monitor for GOC vs the need for nutrition support.   Medications reviewed and include: aspirin, vitamin D3, lovenox, insulin, MVI, pepcid, NaCl w/ KCl & 5% dextrose '@75ml' /hr  Labs reviewed: K 3.3(L), P 2.4(L), Mg 1.9 wnl cbgs- 89, 104, 88, 96 x 24 hrs  Diet Order:   Diet Order            DIET - DYS 1 Room service appropriate? Yes with Assist; Fluid consistency: Thin  Diet effective now                EDUCATION NEEDS:   No  education needs have been identified at this time  Skin:  Skin Assessment: Reviewed RN Assessment (Left buttock with stage 3 pressure injury; 1.5X1.5X.2cm, Sacrum with unstageqable pressure injury; 5X5cm, Middle abd 25X1X.2cm, Left thigh 15X1X.2cm, Right thigh 8X1X.2cmRight outer abd 3X3X.2cm and 2X2X.2cm) Skin Integrity Issues:: Other (Comment) Other: intertriginous dermatitis to B/L breasts, B/L groin, under pannus, umbilicus; non pressure wound to right lower abdomen  Last BM:  10/21- TYPE 7  Height:   Ht Readings from Last 1 Encounters:  08/05/20 '5\' 5"'  (1.651 m)   Weight:   Wt Readings from Last 1 Encounters:  08/24/20 97.3 kg   Ideal Body Weight:  56.8 kg  BMI:  Body mass index is 35.68 kg/m.  Estimated Nutritional Needs:   Kcal:  2200-2500kcal/day  Protein:  110-125g/day  Fluid:  >/= 2 L/day  Courtney Distance MS, RD, LDN Please refer to [facility] for RD and/or RD on-call/weekend/after hours pager

## 2020-08-24 NOTE — Care Management (Signed)
TOC reached out to MD via secure chat to determine if patient will be getting a peg tube.  Awaiting response

## 2020-08-24 NOTE — Progress Notes (Signed)
Mobility Specialist - Progress Note   08/24/20 1500  Mobility  Activity Refused mobility  Mobility performed by Mobility specialist    Pt was lying in bed upon arrival. Pt initially agreed to session, but would get distracted easily. Pt became agitated d/t not knowing the whereabouts of her son. Mobility attempted to redirect pt. When asked to proceed with supine exercises, pt stated "I better be able to do exercises." Pt then attempted to get OOB saying, "I'm going home, y'all do them exercises." Session was concluded and nurse was notified. Pt remains in bed with all needs in reach and alarm set. Will attempt session at another date when pt is agreeable.    Kathee Delton Mobility Specialist 08/24/20, 3:17 PM

## 2020-08-25 DIAGNOSIS — I4891 Unspecified atrial fibrillation: Secondary | ICD-10-CM | POA: Diagnosis not present

## 2020-08-25 DIAGNOSIS — U071 COVID-19: Secondary | ICD-10-CM | POA: Diagnosis not present

## 2020-08-25 DIAGNOSIS — A419 Sepsis, unspecified organism: Secondary | ICD-10-CM | POA: Diagnosis not present

## 2020-08-25 DIAGNOSIS — I10 Essential (primary) hypertension: Secondary | ICD-10-CM | POA: Diagnosis not present

## 2020-08-25 LAB — BASIC METABOLIC PANEL
Anion gap: 8 (ref 5–15)
BUN: 7 mg/dL — ABNORMAL LOW (ref 8–23)
CO2: 25 mmol/L (ref 22–32)
Calcium: 10.3 mg/dL (ref 8.9–10.3)
Chloride: 103 mmol/L (ref 98–111)
Creatinine, Ser: 1.08 mg/dL — ABNORMAL HIGH (ref 0.44–1.00)
GFR, Estimated: 57 mL/min — ABNORMAL LOW (ref >60)
Glucose, Bld: 103 mg/dL — ABNORMAL HIGH (ref 70–99)
Potassium: 3.4 mmol/L — ABNORMAL LOW (ref 3.5–5.1)
Sodium: 136 mmol/L (ref 135–145)

## 2020-08-25 LAB — GLUCOSE, CAPILLARY
Glucose-Capillary: 93 mg/dL (ref 70–99)
Glucose-Capillary: 106 mg/dL — ABNORMAL HIGH (ref 70–99)
Glucose-Capillary: 105 mg/dL — ABNORMAL HIGH (ref 70–99)
Glucose-Capillary: 103 mg/dL — ABNORMAL HIGH (ref 70–99)
Glucose-Capillary: 107 mg/dL — ABNORMAL HIGH (ref 70–99)
Glucose-Capillary: 114 mg/dL — ABNORMAL HIGH (ref 70–99)

## 2020-08-25 LAB — HEPATIC FUNCTION PANEL
ALT: 18 U/L (ref 0–44)
AST: 23 U/L (ref 15–41)
Albumin: 3.1 g/dL — ABNORMAL LOW (ref 3.5–5.0)
Alkaline Phosphatase: 66 U/L (ref 38–126)
Bilirubin, Direct: 0.2 mg/dL (ref 0.0–0.2)
Indirect Bilirubin: 0.6 mg/dL (ref 0.3–0.9)
Total Bilirubin: 0.8 mg/dL (ref 0.3–1.2)
Total Protein: 7.2 g/dL (ref 6.5–8.1)

## 2020-08-25 LAB — MAGNESIUM: Magnesium: 1.9 mg/dL (ref 1.7–2.4)

## 2020-08-25 LAB — PHOSPHORUS: Phosphorus: 2.2 mg/dL — ABNORMAL LOW (ref 2.5–4.6)

## 2020-08-25 MED ORDER — POTASSIUM & SODIUM PHOSPHATES 280-160-250 MG PO PACK
1.0000 | PACK | Freq: Once | ORAL | Status: AC
  Administered 2020-08-25: 1 via ORAL
  Filled 2020-08-25: qty 1

## 2020-08-25 MED ORDER — APIXABAN 5 MG PO TABS
5.0000 mg | ORAL_TABLET | Freq: Two times a day (BID) | ORAL | Status: DC
  Administered 2020-08-25 – 2020-08-28 (×6): 5 mg via ORAL
  Filled 2020-08-25 (×6): qty 1

## 2020-08-25 NOTE — Progress Notes (Signed)
PROGRESS NOTE    Courtney Anderson  IRS:[id number] DOB: [birth year] DOA: 07/24/2020 PCP: Guadalupe Maple, MD    Brief Narrative:  Courtney Anderson an 64 y.o.female.  Courtney Anderson is a 64 year old female with history noted below, who was admitted to [facility] on 2020-07-24 brought in via EMS as a "code sepsis". Patient subsequently tested positive for COVID-19. She presented with oxygen saturations of 69% on room air and had to be placed on nonrebreather mask to achieve oxygen saturations in the 90s. She was subsequently admitted to the Covid unit for management of COVID-19 pneumonia. She was noted to have acute kidney injury she was treated with remdesivir, zinc, vitamin C and antitussives. She was placed empirically on Rocephin and azithromycin. She continued to have increased FiO2 requirements. She was refusing to self prone. She had atrial fibrillation and was seen by cardiologyto assist in management.  07/31/20- patient is on 90% FiO2, will modify meds as patient is on metoprolol and levophed as well as cardizem. Will d/c propofol to improve SBP and remove arythmogenic vasopressor if able. Plan to have CVP trending and diuresis today. Goals of care discussion today - patient made DNR per sister and son of patient. I had 2 separate phone meetings with son and separate conversation with sister today to explain patient is worse with shock and renal failure requiring multiple pressors.   08/01/20- patient remains critically ill. FiO2 has been weaned down to 80% today.  08/03/20- patient remains critically ill shes being proned, weaning FiO2 as able.  08/04/20-patient with no interval changes continue with weaning and proning protocol. Continue weaning FiO2 and PEEP as to optimize for SBT 08/06/20- patient is weaned down 40%FiO2 today, will attempt awakening trial with SBT, I spoke to son [name] today, he appreciates update and does not want Elink video view at this time.  08/07/20- patient is  requiring 50%FiO2 today 08/08/20- Continuing to wean vent. FiO2 is 28%, will trial SBT today 08/09/20-tachypneic and hypertensive with SBT. Asynchronous.FiO2 back up to 50% 10/7 severeresp failure, attempt SAT/SBT 10/7 extubated to biPAP 10/8 Tachycardic , more confused,patient re-intubated ETT 8.0 10/9 remains on vent 10/11 severe resp failure on vent 10/12 failed weaning trial 10/12 successfully extubated 10/14 TRANSFER TO TRH 10/15 somnolent, sleepy , not interactive speech and swallow passed, but had aspiration event. At risk for aspiration .  10/16- mentation better today, down to 4L. Son at bedside 10/17- pt in foul mood, upset why she is here. Thinks she has been here for 160 days".  10/18- awake, calmer today, mentating well.  Refusing meds being crushed with applesauce or ice cream as speech had recommended 10/19-pt refusing to eat diet provided. Refused to take po meds crushed with apple sauce. Spoke to son today, told him about this. Also discussed her nutrition status, if she refuses she will need PEG placement. He wants to come by tonight after work to encourage her to take the food and if not, he will think about the PEG.  10/21-patient agreed to try food from home as she does not like hospital food.  She was asking for fried chicken and chips.  Encouraged to try stool and smoothies before fried food.  Patient agrees to give it a try.  Remained stable on room air.  Consultants:   Pccm, cardiology  Procedures:  Antimicrobials:   Completed the course of antibiotics.  Subjective: Patient has no new complaint today.  He wants to go home.  Able to eat some food which  was brought from home.  Objective: Vitals:   08/25/20 0402 08/25/20 0805 08/25/20 1157 08/25/20 1520  BP: 130/63 131/80 136/84 (!) 123/55  Pulse: 67 71 80 77  Resp: 18 20 (!) 22 (!) 22  Temp: 97.7 F (36.5 C) 97.9 F (36.6 C) 97.8 F (36.6 C) 97.7 F (36.5 C)  TempSrc: Oral Oral Oral Oral  SpO2: 90%  94% 96% 95%  Weight: 97.5 kg     Height:        Intake/Output Summary (Last 24 hours) at 08/25/2020 1757 Last data filed at 08/25/2020 1158 Gross per 24 hour  Intake 701.11 ml  Output 1450 ml  Net -748.89 ml   Filed Weights   08/22/20 0413 08/24/20 0414 08/25/20 0402  Weight: 94.9 kg 97.3 kg 97.5 kg    Examination:  General.  Well-developed lady, in no acute distress. Pulmonary.  Lungs clear bilaterally, normal respiratory effort. CV.  Regular rate and rhythm, no JVD, rub or murmur. Abdomen.  Soft, nontender, nondistended, BS positive. CNS.  Alert and oriented x3.  No focal neurologic deficit. Extremities.  No edema, no cyanosis, pulses intact and symmetrical. Psychiatry.  Judgment and insight appears normal.   Data Reviewed: I have personally reviewed following labs and imaging studies  CBC: Recent Labs  Lab 08/19/20 1231 08/20/20 0346 08/21/20 0637 08/22/20 0444 08/24/20 0540  WBC 5.9 5.6 7.4 8.2 6.6  HGB 11.3* 10.9* 13.3 12.1 12.6  HCT 35.8* 34.5* 41.9 38.2 39.6  MCV 86.1 85.8 86.4 86.0 86.1  PLT 110* 104* 113* 150 428*   Basic Metabolic Panel: Recent Labs  Lab 08/21/20 0637 08/22/20 0444 08/23/20 0415 08/24/20 0540 08/25/20 0550  NA 137 139 136 137 136  K 3.7 3.2* 3.2* 3.3* 3.4*  CL 95* 96* 98 101 103  CO2 26 28 29 24 25   GLUCOSE 96 102* 113* 110* 103*  BUN 8 10 8  7* 7*  CREATININE 1.07* 1.28* 0.98 0.99 1.08*  CALCIUM 10.5* 11.2* 9.9 10.4* 10.3  MG 1.9 1.8 2.0 1.9 1.9  PHOS 3.1 3.6 2.3* 2.4* 2.2*   GFR: Estimated Creatinine Clearance: 60 mL/min (A) (by C-G formula based on SCr of 1.08 mg/dL (H)). Liver Function Tests: Recent Labs  Lab 08/19/20 0219 08/20/20 0346 08/21/20 0637 08/25/20 0550  AST 21 21 22 23   ALT 26 26 25 18   ALKPHOS 64 62 66 66  BILITOT 1.3* 0.9 1.0 0.8  PROT 6.7 6.4* 7.3 7.2  ALBUMIN 2.6* 2.7* 3.2* 3.1*   No results for input(s): LIPASE, AMYLASE in the last 168 hours. No results for input(s): AMMONIA in the last 168  hours. Coagulation Profile: No results for input(s): INR, PROTIME in the last 168 hours. Cardiac Enzymes: No results for input(s): CKTOTAL, CKMB, CKMBINDEX, TROPONINI in the last 168 hours. BNP (last 3 results) No results for input(s): PROBNP in the last 8760 hours. HbA1C: No results for input(s): HGBA1C in the last 72 hours. CBG: Recent Labs  Lab 08/25/20 0100 08/25/20 0441 08/25/20 0815 08/25/20 1153 08/25/20 1634  GLUCAP 93 106* 105* 103* 107*   Lipid Profile: No results for input(s): CHOL, HDL, LDLCALC, TRIG, CHOLHDL, LDLDIRECT in the last 72 hours. Thyroid Function Tests: No results for input(s): TSH, T4TOTAL, FREET4, T3FREE, THYROIDAB in the last 72 hours. Anemia Panel: No results for input(s): VITAMINB12, FOLATE, FERRITIN, TIBC, IRON, RETICCTPCT in the last 72 hours. Sepsis Labs: No results for input(s): PROCALCITON, LATICACIDVEN in the last 168 hours.  No results found for this or any previous visit (  from the past 240 hour(s)).    Radiology Studies: No results found.  Artery  Total 8 minutes about discharge summary that the primary vascular B.  Medical metabolic  Scheduled Meds: . amiodarone  200 mg Oral Daily  . apixaban  5 mg Oral BID  . aspirin  81 mg Oral Daily  . Chlorhexidine Gluconate Cloth  6 each Topical Daily  . cholecalciferol  2,000 Units Oral Daily  . collagenase   Topical Daily  . insulin aspart  0-9 Units Subcutaneous Q4H  . mouth rinse  15 mL Mouth Rinse q12n4p  . multivitamin with minerals  1 tablet Oral Daily  . multivitamin-lutein  1 capsule Oral Daily  . nystatin   Topical TID  . pneumococcal 23 valent vaccine  0.5 mL Intramuscular Tomorrow-1000   Continuous Infusions: . sodium chloride 5 mL/hr at 08/23/20 0300  . dextrose 5 % and 0.45 % NaCl with KCl 20 mEq/L 75 mL/hr at 08/25/20 1426    Assessment & Plan:   Principal Problem:   Sepsis ([place]) Active Problems:   Hypertension   Morbid obesity ([place])   Hypercalcemia    Hyperthyroidism   AKI (acute kidney injury) ([place])   COVID-19   Atrial fibrillation with RVR ([place])   Hypokalemia   Skin ulcer ([place])   Acute hypoxemic respiratory failure due to COVID-19 pneumonia / ARDScomplicated by renal failure-resolved  s/p extubation and re-intubated for acute aspiration pneumonia At risk of aspiration, completed the course of Unasyn. Saturating well on room air now.  Morbid obesity, possible OSA.  -Because medical issues and respiratory status  Needs outpatient sleep study  AFIB WITH RVR- currently in sinus rhythm Cardiology following. Transition to p.o. amiodarone as she started taking orally. Restart Eliquis per cardiology.  Hypophosphatemia-patient is high risk for refeeding syndrome. --replete PRN and monitor  Hypokalemia --replete PRN  Nutrition Patient agrees to try some soups and smoothies before jumping to regular food but it has to be from her home as she does not like hospital diet. High risk for refeeding syndrome.  Foley catheter.  Foley catheter was placed when patient was in ICU.  Remained in as she developed pressure injuries in groin areas. Discussed with wound care nurse and will try taking Foley out and place pure wick for healing.  If patient continued to soil area as she also has an history of incontinence we might have to replace Foley catheter for wound healing. -Foley catheter was removed yesterday and she is able to keep her wounds dry with pure wick.  Central line.  Patient continued to have central line in place. We will try taking it out once started taking p.o.  DVT prophylaxis: Lovenox Code Status: Full Code Palliative care following  Family Communication: son updated on phone.  Status is: Inpatient  Dispo: The patient is from: Home               Anticipated d/c is to: family refused SNF, needs HH on discharge to home   Anticipated d/c date is: 1-2 days  Patient currently is not medically stable to d/c.   Patient started taking p.o., we will monitor electrolytes for next day or 2 as she is high risk for refeeding syndrome.    LOS: 32 days    Lorella Nimrod, MD [facility] Pager [phone]

## 2020-08-25 NOTE — Progress Notes (Addendum)
Pt admitted with covid pna. Was intubated and post intubation had aspiration pna. Had refused po meds so was on iv amiodarone. No states she will take po. Agree with amiodarone 200 mg po daily. Cand consider adding po anticoagulation if hhe will consistantly be compliant with po meds.   Dr. [name] will be covering cardiology for [facility] this weekend.

## 2020-08-25 NOTE — Progress Notes (Signed)
Patient Name: Courtney Anderson Date of Encounter: 08/25/2020  Hospital Problem List     Principal Problem:   Sepsis [facility]) Active Problems:   Hypertension   Morbid obesity ([place])   Hypercalcemia   Hyperthyroidism   AKI (acute kidney injury) ([place])   COVID-19   Atrial fibrillation with RVR ([place])   Hypokalemia   Skin ulcer ([place])    Patient Profile        64 yo with history of paroxysmal afib, covid 19 sepsis/pneumonia .Marland Kitchen On amiodarone now able to take oral medication management  and having no current evidence of significant cardiovascular symptoms and/or myocardial infarction or congestive heart failure     Subjective   Patient slowly improving but still short of breath with any physical activity and weak and fatigued  Inpatient Medications    . amiodarone  200 mg Oral Daily  . apixaban  5 mg Oral BID  . aspirin  81 mg Oral Daily  . Chlorhexidine Gluconate Cloth  6 each Topical Daily  . cholecalciferol  2,000 Units Oral Daily  . collagenase   Topical Daily  . insulin aspart  0-9 Units Subcutaneous Q4H  . mouth rinse  15 mL Mouth Rinse q12n4p  . multivitamin with minerals  1 tablet Oral Daily  . multivitamin-lutein  1 capsule Oral Daily  . nystatin   Topical TID  . pneumococcal 23 valent vaccine  0.5 mL Intramuscular Tomorrow-1000    Vital Signs    Vitals:   08/25/20 0402 08/25/20 0805 08/25/20 1157 08/25/20 1520  BP: 130/63 131/80 136/84 (!) 123/55  Pulse: 67 71 80 77  Resp: 18 20 (!) 22 (!) 22  Temp: 97.7 F (36.5 C) 97.9 F (36.6 C) 97.8 F (36.6 C) 97.7 F (36.5 C)  TempSrc: Oral Oral Oral Oral  SpO2: 90% 94% 96% 95%  Weight: 97.5 kg     Height:        Intake/Output Summary (Last 24 hours) at 08/25/2020 1645 Last data filed at 08/25/2020 1158 Gross per 24 hour  Intake 701.11 ml  Output 1850 ml  Net -1148.89 ml   Filed Weights   08/22/20 0413 08/24/20 0414 08/25/20 0402  Weight: 94.9 kg 97.3 kg 97.5 kg    Physical Exam    GEN: Well  nourished, well developed, in no acute distress.  HEENT: normal.  Neck: Supple, no JVD, carotid bruits, or masses. Cardiac: RRR, no murmurs, rubs, or gallops. No clubbing, cyanosis, edema.  Radials/DP/PT 2+ and equal bilaterally.  Respiratory:  Respirations regular and unlabored, few crackles and wheezes bilaterally GI: Soft, nontender, nondistended, BS + x 4. MS: no deformity or atrophy. Skin: warm and dry, no rash. Neuro:  Strength and sensation are intact. Psych: Normal affect.  Labs    CBC Recent Labs    08/24/20 0540  WBC 6.6  HGB 12.6  HCT 39.6  MCV 86.1  PLT 076*   Basic Metabolic Panel Recent Labs    08/24/20 0540 08/25/20 0550  NA 137 136  K 3.3* 3.4*  CL 101 103  CO2 24 25  GLUCOSE 110* 103*  BUN 7* 7*  CREATININE 0.99 1.08*  CALCIUM 10.4* 10.3  MG 1.9 1.9  PHOS 2.4* 2.2*   Liver Function Tests Recent Labs    08/25/20 0550  AST 23  ALT 18  ALKPHOS 66  BILITOT 0.8  PROT 7.2  ALBUMIN 3.1*   No results for input(s): LIPASE, AMYLASE in the last 72 hours. Cardiac Enzymes No results for  input(s): CKTOTAL, CKMB, CKMBINDEX, TROPONINI in the last 72 hours. BNP No results for input(s): BNP in the last 72 hours. D-Dimer No results for input(s): DDIMER in the last 72 hours. Hemoglobin A1C No results for input(s): HGBA1C in the last 72 hours. Fasting Lipid Panel No results for input(s): CHOL, HDL, LDLCALC, TRIG, CHOLHDL, LDLDIRECT in the last 72 hours. Thyroid Function Tests No results for input(s): TSH, T4TOTAL, T3FREE, THYROIDAB in the last 72 hours.  Invalid input(s): FREET3  Telemetry    Normal sinus rhythm  ECG    No ischemia.  Normal sinus rhythm  Radiology    DG Chest 1 View  Result Date: 07/29/2020 CLINICAL DATA:  Initial evaluation for acute tachycardia. History of COVID pneumonia. EXAM: CHEST  1 VIEW COMPARISON:  Prior CTA from 07/28/2020 and radiograph from 07/24/2020. FINDINGS: Transverse heart size stable. Mediastinal silhouette  within normal limits. Lungs mildly hypoinflated. Severe diffuse bilateral airspace disease, right greater than left, progressed and worsened as compared to prior radiograph from 07/24/2020. Scattered air bronchograms noted at the lung bases bilaterally. Underlying pulmonary vascular congestion without overt pulmonary edema. No visible pleural effusion. No pneumothorax. Osseous structures unchanged. IMPRESSION: Severe diffuse bilateral airspace disease, right greater than left, progressed and worsened as compared to previous, concerning for worsened multifocal pneumonia. Electronically Signed   By: Jeannine Boga M.D.   On: 07/29/2020 05:47   DG Abd 1 View  Result Date: 08/11/2020 CLINICAL DATA:  OG tube placement EXAM: ABDOMEN - 1 VIEW COMPARISON:  Radiograph 08/01/2020 FINDINGS: Transesophageal tube tip and side port distal to the GE junction, terminating at the level of the gastric antrum/proximal duodenum bulb. Persistent heterogeneous airspace opacities present in the lung bases likely with associated atelectasis and trace effusions. Multiple air-filled loops of small bowel and colon without frank dilatation or high-grade obstructive pattern. No suspicious calcifications. Likely edematous changes in the soft tissues. No acute osseous abnormality. IMPRESSION: 1. Transesophageal tube tip and side port distal to the GE junction, terminating at the level of the gastric antrum/proximal duodenum bulb. 2. Persistent bibasilar heterogeneous airspace opacities likely with atelectasis and trace effusions. Electronically Signed   By: Lovena Le M.D.   On: 08/11/2020 19:22   DG Abd 1 View  Result Date: 08/01/2020 CLINICAL DATA:  NG placement. EXAM: ABDOMEN - 1 VIEW COMPARISON:  Earlier today FINDINGS: Tip and side port of the enteric tube are now located below the diaphragm in the stomach. Similar bowel-gas pattern tear earlier today. IMPRESSION: Tip and side port of the enteric tube below the diaphragm in  the stomach. Electronically Signed   By: Keith Rake M.D.   On: 08/01/2020 21:56   DG Abd 1 View  Result Date: 08/01/2020 CLINICAL DATA:  OG tube placement EXAM: ABDOMEN - 1 VIEW COMPARISON:  07/31/2020 FINDINGS: Esophageal tube tip in the region of GE junction, side-port the level of distal esophagus. Upper abdominal gas pattern is unobstructed. Airspace disease at both bases. IMPRESSION: Esophageal tube tip overlies the GE junction, side-port overlies distal esophagus, consider further advancement by at least 10 cm for more optimal positioning. These results will be called to the ordering clinician or representative by the Radiologist Assistant, and communication documented in the PACS or [employer]. Electronically Signed   By: Donavan Foil M.D.   On: 08/01/2020 20:22   DG Abd 1 View  Result Date: 07/31/2020 CLINICAL DATA:  Orogastric tube placement EXAM: ABDOMEN - 1 VIEW COMPARISON:  July 30, 2020 FINDINGS: Orogastric tube tip is at  the gastroesophageal junction. No bowel dilatation or air-fluid level to suggest bowel obstruction. No free air. IMPRESSION: Orogastric tube tip at gastroesophageal junction. Advise advancing orogastric tube 8-10 cm. No bowel obstruction or free air evident. Electronically Signed   By: Lowella Grip III M.D.   On: 07/31/2020 12:15   DG Abd 1 View  Result Date: 07/30/2020 CLINICAL DATA:  Evaluate enteric tube. EXAM: ABDOMEN - 1 VIEW COMPARISON:  Earlier same day FINDINGS: Enteric tube tip and side port projects over the central upper abdomen. Nonobstructed bowel gas pattern. Osseous structures unremarkable. IMPRESSION: Interval advancement of the enteric tube with the tip and side port projecting over the central upper abdomen. Recommend advancing the tube an additional 5 cm. Electronically Signed   By: Lovey Newcomer M.D.   On: 07/30/2020 16:49   DG Abd 1 View  Result Date: 07/30/2020 CLINICAL DATA:  COVID positive patient.  OG tube evaluation EXAM:  ABDOMEN - 1 VIEW COMPARISON:  None. FINDINGS: Enteric tube tip and side-port project within the distal esophagus, recommend advancement. Patchy airspace opacities lower lungs bilaterally. IMPRESSION: Enteric tube tip and side-port project within the distal esophagus, recommend advancement. These results will be called to the ordering clinician or representative by the Radiologist Assistant, and communication documented in the PACS or [employer]. Electronically Signed   By: Lovey Newcomer M.D.   On: 07/30/2020 13:11   CT ANGIO CHEST PE W OR WO CONTRAST  Result Date: 07/28/2020 CLINICAL DATA:  Respiratory failure. COVID positive. Rule out pulmonary embolism. EXAM: CT ANGIOGRAPHY CHEST WITH CONTRAST TECHNIQUE: Multidetector CT imaging of the chest was performed using the standard protocol during bolus administration of intravenous contrast. Multiplanar CT image reconstructions and MIPs were obtained to evaluate the vascular anatomy. CONTRAST:  152mL OMNIPAQUE IOHEXOL 350 MG/ML SOLN COMPARISON:  Chest 07/24/2020 FINDINGS: Cardiovascular: Excellent pulmonary artery opacification. Negative for pulmonary embolism. Moderate pulmonary artery enlargement compatible with pulmonary artery hypertension. Main pulmonary artery 33 mm in diameter. Cardiac enlargement. No pericardial effusion. Thoracic aorta normal in caliber. No significant opacification of the aorta. Mediastinum/Nodes: Negative for mediastinal mass or adenopathy. Lungs/Pleura: Severe diffuse bilateral airspace disease most prominent in the bases. No pleural effusion. Upper Abdomen: Negative Musculoskeletal: No acute skeletal abnormality. Review of the MIP images confirms the above findings. IMPRESSION: Negative for pulmonary embolism.  Pulmonary artery hypertension. Severe bilateral airspace disease most prominent the bases compatible with COVID pneumonia. Electronically Signed   By: Franchot Gallo M.D.   On: 07/28/2020 12:21   DG Chest Port 1  View  Result Date: 08/18/2020 CLINICAL DATA:  Aspiration EXAM: PORTABLE CHEST 1 VIEW COMPARISON:  08/11/2020 FINDINGS: Endotracheal tube is no longer present. Unchanged position of left IJ central venous catheter. Bilateral airspace opacities are unchanged. Small pleural effusions. IMPRESSION: Unchanged bilateral airspace opacities. Electronically Signed   By: Ulyses Jarred M.D.   On: 08/18/2020 00:53   DG Chest Port 1 View  Result Date: 08/11/2020 CLINICAL DATA:  64 year old female with respiratory failure. EXAM: PORTABLE CHEST 1 VIEW COMPARISON:  Chest radiograph dated 08/20/2020. FINDINGS: Endotracheal tube with tip in the proximal right mainstem bronchus. Recommend retraction by approximately 4 cm. Left IJ central venous line in similar position. Bilateral pulmonary densities, right greater left, appears similar to prior radiograph. Small bilateral pleural effusions may be present. No pneumothorax. Stable cardiac silhouette. No acute osseous pathology. IMPRESSION: 1. Endotracheal tube with tip in the proximal right mainstem bronchus. Recommend retraction by approximately 4 cm. 2. No significant interval change in bilateral  pulmonary opacities. These results were called by telephone at the time of interpretation on 08/11/2020 at 3:39 pm to provider Adalberto Ill, who verbally acknowledged these results. Electronically Signed   By: Anner Crete M.D.   On: 08/11/2020 15:50   DG Chest Port 1 View  Result Date: 08/10/2020 CLINICAL DATA:  Acute respiratory failure. EXAM: PORTABLE CHEST 1 VIEW COMPARISON:  08/07/2020.  07/31/2020. FINDINGS: Endotracheal tube, NG tube, left IJ line in stable position. Stable cardiomegaly. Multifocal bilateral pulmonary infiltrates are again noted. Small bilateral pleural effusions. No pneumothorax. IMPRESSION: 1. Lines and tubes in stable position. 2. Stable cardiomegaly. 3. Multifocal bilateral pulmonary infiltrates again noted. No interim change. Electronically Signed    By: Marcello Moores  Register   On: 08/10/2020 05:13   DG Chest Port 1 View  Result Date: 08/07/2020 CLINICAL DATA:  COVID-19 pneumonia. EXAM: PORTABLE CHEST 1 VIEW COMPARISON:  Abdominal x-ray dated August 01, 2020. Chest x-ray dated July 31, 2020. FINDINGS: Unchanged endotracheal and enteric tubes. Unchanged left internal jugular central venous catheter. Stable cardiomediastinal silhouette. Poor inspiratory effort compared to the prior study. Peripheral and basilar predominant patchy airspace disease in the right greater than left lungs is not significantly changed. No pneumothorax or pleural effusion. No acute osseous abnormality. IMPRESSION: 1. Unchanged multifocal pneumonia. Electronically Signed   By: Titus Dubin M.D.   On: 08/07/2020 10:00   DG Chest Port 1 View  Result Date: 07/31/2020 CLINICAL DATA:  Orogastric tube placement EXAM: PORTABLE CHEST 1 VIEW COMPARISON:  July 30, 2020. FINDINGS: Endotracheal tube tip is 6.0 cm above carina. Orogastric tube tip is at the gastroesophageal junction. Left jugular catheter tip is in the superior vena cava. No pneumothorax. Patchy airspace opacity is present bilaterally, primarily in the periphery of the upper and lower lung regions, essentially stable. Heart is upper normal in size with pulmonary vascularity normal. No adenopathy. No bone lesions. IMPRESSION: Tube and catheter positions as described without pneumothorax. Note that the orogastric tube tip is at the gastroesophageal junction. Advise advancing orogastric tube 8-10 cm. Patchy airspace opacity bilaterally, stable. Stable cardiac silhouette. Electronically Signed   By: Lowella Grip III M.D.   On: 07/31/2020 12:14   DG Chest Port 1 View  Result Date: 07/30/2020 CLINICAL DATA:  Worsening COVID pneumonia.  Intubation. EXAM: PORTABLE CHEST 1 VIEW COMPARISON:  07/29/2020 FINDINGS: The endotracheal tube is 4.3 cm above the carina. The left IJ catheter tip is in the distal SVC near the  cavoatrial junction. The NG tube is coursing down the esophagus and into the stomach. The tip is in the fundal region and the proximal port is near the GE junction. This could be advanced several cm. Persistent diffuse interstitial and airspace process in the lungs but slight improved aeration after intubation. No pneumothorax. IMPRESSION: 1. Persistent infiltrates but slight improved aeration after intubation. 2. ET tube and left IJ catheter in good position. 3. NG tube tip in the fundal region and proximal port is near the GE junction. This could be advanced several cm. Electronically Signed   By: Marijo Sanes M.D.   On: 07/30/2020 13:11    Assessment & Plan    Principal Problem:   Sepsis ([place]) Active Problems:   Hypertension   Morbid obesity ([place])   Hypercalcemia   Hyperthyroidism   AKI (acute kidney injury) ([place])   COVID-19 virus infection   Atrial fibrillation with RVR ([facility])   Hypokalemia     1.  Paroxysmal atrial fibrillation with rapid ventricular rate, exacerbated by  probable aspiration pneumonia and respiratory failure requiring intubation. She has been on amiodarone drip,   IV digoxin and heparin drip due to aspiration previously.  No obvious aspiration on SLP bedside evaluation.     She converted to sinus rhythm at a rate of 90 bpm.now able to take oral amiodarone at 200 mg daily for continued maintenance of normal sinus rhythm.  Also has converted to intravenous heparin to Eliquis 5 mg twice per day for further risk reduction stroke.  Metoprolol will be considered orally if necessary for heart rate control if patient has episode of atrial fibrillation No further cardiac diagnostics necessary at this time due to improvements of symptoms and maintenance of normal sinus rhythm  2.  COVID-19 pneumonia/sepsis, extubated, followed by vomiting and probable aspiration pneumonia with recurre nt respiratory failure requiring intubation, now extubated and appears to be doing well.      The  patient has been interviewed and examined. I agree with assessment and plan above. Serafina Royals MD [facility]

## 2020-08-25 NOTE — Progress Notes (Addendum)
[facility] for Electrolyte Monitoring and Replacement   Recent Labs: Potassium (mmol/L)  Date Value  08/25/2020 3.4 (L)   Magnesium (mg/dL)  Date Value  08/25/2020 1.9   Calcium (mg/dL)  Date Value  08/25/2020 10.3   Calcium, Total (PTH) (mg/dL)  Date Value  07/24/2020 10.1   Albumin (g/dL)  Date Value  08/21/2020 3.2 (L)  10/22/2019 4.2   Phosphorus (mg/dL)  Date Value  08/25/2020 2.2 (L)   Sodium (mmol/L)  Date Value  08/25/2020 136  10/22/2019 139   Assessment: 64 year old female with PMHx of HTN, HLD, primary hyperparathyroidism who was admitted with COVID-19 PNA, also with A-fib with RVR and severe intertrigo. Patient extubated 10/12. Patient has been weaned to room air.   Patient was NPO secondary to aspiration events. Patient seen by SLP a couple of days ago. Cleared patient to take medications crushed in puree. Dysphagia 1 diet has been ordered. Patient has converted to NSR. Patient previously refusing to take PO medications but agreed to take them 10/21. Now on oral amiodarone. Heart rate well controlled at this time.   MIVF: D5-1/2 NS + 20 mEq K/L at 75 mL/hr Diuresis: IV Lasix 20 mg BID (d/c 10/18) UOP: 1900 mL yesterday  Goal of Therapy:  Potassium 4.0 - 5.1 mmol/L Magnesium 2.0 - 2.4 mg/dL All Other Electrolytes WNL  Plan:  10/21  K 3.4  Mag 1.9  Phos 2.2 Scr 1.08  Potassium improved. Will continue MIVF to D5-1/2 NS + 20 mEq K/L at 75 mL/hr (36 mEq K/day)  Phosphorous mildly low again today. Suspect secondary to hyperparathyroidism / poor PO intake. Will likely remain refractory to repletion given underlying condition. Will order Phos-Nak x 1 packet given patient is at high re-feed risk. Will re-check tomorrow  Will try to replace electrolytes orally as able  Benita Gutter 08/25/2020 7:39 AM

## 2020-08-25 NOTE — Progress Notes (Signed)
Physical Therapy Treatment Patient Details Name: Courtney Anderson MRN: [id number] DOB: 1956-06-09 Today's Date: 08/25/2020    History of Present Illness Courtney Anderson is a 2yoF PMH: HTN, hypoTSH, who comes to [facility] on 9/20 after 1 weeks of cough, congestion, weakness, intermittent fever chills. Pt admitted with code sepsis, (+) COVID19. Pt having issues with AF/RVR upon arrival.    PT Comments    Pt was supine in bed with HOB elevated ~ 30 degrees. She was in bed without O2 donned with sao2 >94%. She needs encouragement to sit up EOB. Required max assist to progress to EOB. Sat EOB x ~ 10 minutes. Has desaturation to 84%. 2 L o2 applied and sao2 recovers to 90s. Pt is very limited by cognition and incontinence episodes. Rn aware pt's need for O2. Discussed with pt's son, who arrived at conclusion of session, DC recommendations. Pt's son does not want pt to go to SNF. PT will continue to follow per POC and advance as able.      Follow Up Recommendations  SNF;Other (comment) (family wants to take pt home against recommendations)     Equipment Recommendations  None recommended by PT;Other (comment)       Precautions / Restrictions Precautions Precautions: Fall Precaution Comments: multiple wounds; watch O2 levels; aspiration; L IJ CVC Restrictions Weight Bearing Restrictions: No    Mobility  Bed Mobility Overal bed mobility: Needs Assistance Bed Mobility: Supine to Sit;Sit to Supine     Supine to sit: Mod assist;Max assist;HOB elevated Sit to supine: Mod assist;Max assist;HOB elevated   General bed mobility comments: mod/max of one to progress to EOB and returning to long sit. pt easy distracted and has difficulty following commands. Unable to trail standing 2/2 to being soaked in urine. RN tech notified  Transfers  General transfer comment: unsafe 2/2 cognition. pt unwilling to trial        Balance Overall balance assessment: Needs assistance Sitting-balance support: No  upper extremity supported;Feet supported Sitting balance-Leahy Scale: Good Sitting balance - Comments: steady sitting reaching within BOS         Cognition Arousal/Alertness: Awake/alert Behavior During Therapy: Flat affect Overall Cognitive Status: History of cognitive impairments - at baseline (per son, she is at baseline cognition) Area of Impairment: Orientation;Attention;Awareness           General Comments General comments (skin integrity, edema, etc.): did bed level exercises prior to sitting up EOB      Pertinent Vitals/Pain Pain Assessment: No/denies pain           PT Goals (current goals can now be found in the care plan section) Acute Rehab PT Goals Patient Stated Goal: to go home Progress towards PT goals: Progressing toward goals    Frequency    Min 2X/week      PT Plan Current plan remains appropriate    Co-evaluation     PT goals addressed during session: Mobility/safety with mobility;Balance;Strengthening/ROM;Proper use of DME        AM-PAC PT "6 Clicks" Mobility   Outcome Measure  Help needed turning from your back to your side while in a flat bed without using bedrails?: A Lot Help needed moving from lying on your back to sitting on the side of a flat bed without using bedrails?: A Lot Help needed moving to and from a bed to a chair (including a wheelchair)?: A Lot Help needed standing up from a chair using your arms (e.g., wheelchair or bedside chair)?: A Lot Help needed  to walk in hospital room?: A Lot Help needed climbing 3-5 steps with a railing? : A Lot 6 Click Score: 12    End of Session Equipment Utilized During Treatment: Other (comment);Oxygen (applied 2L O2 once in sitting) Activity Tolerance: Patient tolerated treatment well Patient left: in bed;with call bell/phone within reach;with bed alarm set Nurse Communication: Mobility status;Precautions PT Visit Diagnosis: Other abnormalities of gait and mobility (R26.89);Muscle  weakness (generalized) (M62.81)     Time: 8563-1497 PT Time Calculation (min) (ACUTE ONLY): 19 min  Charges:  $Therapeutic Activity: 8-22 mins                     Julaine Fusi PTA 08/25/20, 3:49 PM

## 2020-08-26 DIAGNOSIS — U071 COVID-19: Secondary | ICD-10-CM | POA: Diagnosis not present

## 2020-08-26 DIAGNOSIS — I4891 Unspecified atrial fibrillation: Secondary | ICD-10-CM | POA: Diagnosis not present

## 2020-08-26 DIAGNOSIS — A419 Sepsis, unspecified organism: Secondary | ICD-10-CM | POA: Diagnosis not present

## 2020-08-26 DIAGNOSIS — I10 Essential (primary) hypertension: Secondary | ICD-10-CM | POA: Diagnosis not present

## 2020-08-26 LAB — BASIC METABOLIC PANEL
Anion gap: 9 (ref 5–15)
BUN: 6 mg/dL — ABNORMAL LOW (ref 8–23)
CO2: 23 mmol/L (ref 22–32)
Calcium: 10.4 mg/dL — ABNORMAL HIGH (ref 8.9–10.3)
Chloride: 105 mmol/L (ref 98–111)
Creatinine, Ser: 1.02 mg/dL — ABNORMAL HIGH (ref 0.44–1.00)
GFR, Estimated: >60 mL/min (ref >60)
Glucose, Bld: 120 mg/dL — ABNORMAL HIGH (ref 70–99)
Potassium: 3.4 mmol/L — ABNORMAL LOW (ref 3.5–5.1)
Sodium: 137 mmol/L (ref 135–145)

## 2020-08-26 LAB — GLUCOSE, CAPILLARY
Glucose-Capillary: 100 mg/dL — ABNORMAL HIGH (ref 70–99)
Glucose-Capillary: 108 mg/dL — ABNORMAL HIGH (ref 70–99)
Glucose-Capillary: 111 mg/dL — ABNORMAL HIGH (ref 70–99)
Glucose-Capillary: 121 mg/dL — ABNORMAL HIGH (ref 70–99)
Glucose-Capillary: 125 mg/dL — ABNORMAL HIGH (ref 70–99)
Glucose-Capillary: 86 mg/dL (ref 70–99)

## 2020-08-26 LAB — PHOSPHORUS: Phosphorus: 2.7 mg/dL (ref 2.5–4.6)

## 2020-08-26 LAB — MAGNESIUM: Magnesium: 1.8 mg/dL (ref 1.7–2.4)

## 2020-08-26 MED ORDER — DEXTROSE-NACL 5-0.9 % IV SOLN: INTRAVENOUS | Status: DC

## 2020-08-26 MED ORDER — MAGNESIUM SULFATE 2 GM/50ML IV SOLN
2.0000 g | Freq: Once | INTRAVENOUS | Status: AC
  Administered 2020-08-26: 2 g via INTRAVENOUS
  Filled 2020-08-26: qty 50

## 2020-08-26 MED ORDER — POTASSIUM CHLORIDE 20 MEQ PO PACK
40.0000 meq | PACK | Freq: Once | ORAL | Status: AC
  Administered 2020-08-26: 40 meq via ORAL
  Filled 2020-08-26: qty 2

## 2020-08-26 NOTE — Progress Notes (Signed)
PROGRESS NOTE    Courtney Anderson  IRC:[id number] DOB: 08/24/1956 DOA: 07/24/2020 PCP: Courtney Maple, MD    Brief Narrative:  Courtney Anderson an 64 y.o.female.  PZW:CHENIDP is a 64 year old female with history noted below, who was admitted to [facility] on 2020-07-24 brought in via EMS as a "code sepsis". Patient subsequently tested positive for COVID-19. She presented with oxygen saturations of 69% on room air and had to be placed on nonrebreather mask to achieve oxygen saturations in the 90s. She was subsequently admitted to the Covid unit for management of COVID-19 pneumonia. She was noted to have acute kidney injury she was treated with remdesivir, zinc, vitamin C and antitussives. She was placed empirically on Rocephin and azithromycin. She continued to have increased FiO2 requirements. She was refusing to self prone. She had atrial fibrillation and was seen by cardiologyto assist in management.  07/31/20- patient is on 90% FiO2, will modify meds as patient is on metoprolol and levophed as well as cardizem. Will d/c propofol to improve SBP and remove arythmogenic vasopressor if able. Plan to have CVP trending and diuresis today. Goals of care discussion today - patient made DNR per sister and son of patient. I had 2 separate phone meetings with son and separate conversation with sister today to explain patient is worse with shock and renal failure requiring multiple pressors.   08/01/20- patient remains critically ill. FiO2 has been weaned down to 80% today.  08/03/20- patient remains critically ill shes being proned, weaning FiO2 as able.  08/04/20-patient with no interval changes continue with weaning and proning protocol. Continue weaning FiO2 and PEEP as to optimize for SBT 08/06/20- patient is weaned down 40%FiO2 today, will attempt awakening trial with SBT, I spoke to son [name] today, he appreciates update and does not want Elink video view at this time.  08/07/20- patient is  requiring 50%FiO2 today 08/08/20- Continuing to wean vent. FiO2 is 28%, will trial SBT today 08/09/20-tachypneic and hypertensive with SBT. Asynchronous.FiO2 back up to 50% 10/7 severeresp failure, attempt SAT/SBT 10/7 extubated to biPAP 10/8 Tachycardic , more confused,patient re-intubated ETT 8.0 10/9 remains on vent 10/11 severe resp failure on vent 10/12 failed weaning trial 10/12 successfully extubated 10/14 TRANSFER TO TRH 10/15 somnolent, sleepy , not interactive speech and swallow passed, but had aspiration event. At risk for aspiration .  10/16- mentation better today, down to 4L. Son at bedside 10/17- pt in foul mood, upset why she is here. Thinks she has been here for 160 days".  10/18- awake, calmer today, mentating well.  Refusing meds being crushed with applesauce or ice cream as speech had recommended 10/19-pt refusing to eat diet provided. Refused to take po meds crushed with apple sauce. Spoke to son today, told him about this. Also discussed her nutrition status, if she refuses she will need PEG placement. He wants to come by tonight after work to encourage her to take the food and if not, he will think about the PEG.  10/21-patient agreed to try food from home as she does not like hospital food.  She was asking for fried chicken and chips.  Encouraged to try stool and smoothies before fried food.  Patient agrees to give it a try.  Remained stable.  Consultants:   Pccm, cardiology  Procedures:  Antimicrobials:   Completed the course of antibiotics.  Subjective: Patient was lying comfortably when seen today.  Breakfast was at bedside.  When asked patient said that she will eat it  in a little while.  Oriented to self only.  Per patient she did not eat her dinner from home.  Continue to ask when she can be discharge.  Objective: Vitals:   08/26/20 0500 08/26/20 0600 08/26/20 0740 08/26/20 1110  BP:   123/71 (!) 121/99  Pulse:   74 77  Resp:  20 20 17   Temp:   97.7  F (36.5 C) 97.6 F (36.4 C)  TempSrc:   Oral Oral  SpO2: 93% 97% 96% (!) 85%  Weight:      Height:        Intake/Output Summary (Last 24 hours) at 08/26/2020 1427 Last data filed at 08/26/2020 1009 Gross per 24 hour  Intake --  Output 350 ml  Net -350 ml   Filed Weights   08/22/20 0413 08/24/20 0414 08/25/20 0402  Weight: 94.9 kg 97.3 kg 97.5 kg    Examination:  General.  Well-developed lady, in no acute distress. Pulmonary.  Lungs clear bilaterally, normal respiratory effort. CV.  Regular rate and rhythm, no JVD, rub or murmur. Abdomen.  Soft, nontender, nondistended, BS positive. CNS.  Alert and oriented to self only.  No focal neurologic deficit. Extremities.  No edema, no cyanosis, pulses intact and symmetrical. Psychiatry.  Judgment and insight appears impaired.  Data Reviewed: I have personally reviewed following labs and imaging studies  CBC: Recent Labs  Lab 08/20/20 0346 08/21/20 0637 08/22/20 0444 08/24/20 0540  WBC 5.6 7.4 8.2 6.6  HGB 10.9* 13.3 12.1 12.6  HCT 34.5* 41.9 38.2 39.6  MCV 85.8 86.4 86.0 86.1  PLT 104* 113* 150 295*   Basic Metabolic Panel: Recent Labs  Lab 08/22/20 0444 08/23/20 0415 08/24/20 0540 08/25/20 0550 08/26/20 0420  NA 139 136 137 136 137  K 3.2* 3.2* 3.3* 3.4* 3.4*  CL 96* 98 101 103 105  CO2 28 29 24 25 23   GLUCOSE 102* 113* 110* 103* 120*  BUN 10 8 7* 7* 6*  CREATININE 1.28* 0.98 0.99 1.08* 1.02*  CALCIUM 11.2* 9.9 10.4* 10.3 10.4*  MG 1.8 2.0 1.9 1.9 1.8  PHOS 3.6 2.3* 2.4* 2.2* 2.7   GFR: Estimated Creatinine Clearance: 63.5 mL/min (A) (by C-G formula based on SCr of 1.02 mg/dL (H)). Liver Function Tests: Recent Labs  Lab 08/20/20 0346 08/21/20 0637 08/25/20 0550  AST 21 22 23   ALT 26 25 18   ALKPHOS 62 66 66  BILITOT 0.9 1.0 0.8  PROT 6.4* 7.3 7.2  ALBUMIN 2.7* 3.2* 3.1*   No results for input(s): LIPASE, AMYLASE in the last 168 hours. No results for input(s): AMMONIA in the last 168  hours. Coagulation Profile: No results for input(s): INR, PROTIME in the last 168 hours. Cardiac Enzymes: No results for input(s): CKTOTAL, CKMB, CKMBINDEX, TROPONINI in the last 168 hours. BNP (last 3 results) No results for input(s): PROBNP in the last 8760 hours. HbA1C: No results for input(s): HGBA1C in the last 72 hours. CBG: Recent Labs  Lab 08/25/20 1916 08/26/20 0033 08/26/20 0413 08/26/20 0738 08/26/20 1132  GLUCAP 114* 121* 125* 108* 100*   Lipid Profile: No results for input(s): CHOL, HDL, LDLCALC, TRIG, CHOLHDL, LDLDIRECT in the last 72 hours. Thyroid Function Tests: No results for input(s): TSH, T4TOTAL, FREET4, T3FREE, THYROIDAB in the last 72 hours. Anemia Panel: No results for input(s): VITAMINB12, FOLATE, FERRITIN, TIBC, IRON, RETICCTPCT in the last 72 hours. Sepsis Labs: No results for input(s): PROCALCITON, LATICACIDVEN in the last 168 hours.  No results found for this or any  previous visit (from the past 240 hour(s)).    Radiology Studies: No results found.  Artery  Total 8 minutes about discharge summary that the primary vascular B.  Medical metabolic  Scheduled Meds: . amiodarone  200 mg Oral Daily  . apixaban  5 mg Oral BID  . aspirin  81 mg Oral Daily  . Chlorhexidine Gluconate Cloth  6 each Topical Daily  . cholecalciferol  2,000 Units Oral Daily  . collagenase   Topical Daily  . insulin aspart  0-9 Units Subcutaneous Q4H  . mouth rinse  15 mL Mouth Rinse q12n4p  . multivitamin with minerals  1 tablet Oral Daily  . multivitamin-lutein  1 capsule Oral Daily  . nystatin   Topical TID  . pneumococcal 23 valent vaccine  0.5 mL Intramuscular Tomorrow-1000   Continuous Infusions: . sodium chloride 5 mL/hr at 08/23/20 0300    Assessment & Plan:   Principal Problem:   Sepsis ([place]) Active Problems:   Hypertension   Morbid obesity ([place])   Hypercalcemia   Hyperthyroidism   AKI (acute kidney injury) ([place])   COVID-19   Atrial fibrillation  with RVR ([place])   Hypokalemia   Skin ulcer ([place])   Acute hypoxemic respiratory failure due to COVID-19 pneumonia / ARDScomplicated by renal failure-resolved  s/p extubation and re-intubated for acute aspiration pneumonia At risk of aspiration, completed the course of Unasyn. Saturating well on room air -still requiring intermittent oxygen up to 2 L.  Morbid obesity, possible OSA.  -Because medical issues and respiratory status  Needs outpatient sleep study  AFIB WITH RVR- currently in sinus rhythm Cardiology following. Transition to p.o. amiodarone as she started taking orally. Restart Eliquis per cardiology.  Hypophosphatemia-patient is high risk for refeeding syndrome. --replete PRN and monitor  Hypokalemia --replete PRN  Nutrition Patient agrees to try some soups and smoothies before jumping to regular food but it has to be from her home as she does not like hospital diet. High risk for refeeding syndrome.  Foley catheter.  Foley catheter was placed when patient was in ICU.  Remained in as she developed pressure injuries in groin areas. Discussed with wound care nurse and will try taking Foley out and place pure wick for healing.  If patient continued to soil area as she also has an history of incontinence we might have to replace Foley catheter for wound healing. -Foley catheter was removed on 08/24/20. and she is able to keep her wounds dry with pure wick.  Central line.  We will remove central line today.  DVT prophylaxis: Lovenox Code Status: Full Code Palliative care following  Family Communication: son updated on phone, again asked that she will require a lot of support at home or they are changing their mind for SNF.  Per son she will come home with home health services which were ordered. Per son he was told that hospital bed, wheelchair, 3 in 1 and a rolling walker was ordered last week but he has not heard anything yet.  Status is: Inpatient  Dispo: The  patient is from: Home               Anticipated d/c is to: family refused SNF, needs HH on discharge to home. Home health services ordered.  Will ask TOC to look for her DME needs so she can be discharged.  She needs delivery before going home.  Anticipated d/c date is: 1-2 days  Patient currently is medically stable.  Patient started taking p.o., we  will monitor electrolytes for next day or 2 as she is high risk for refeeding syndrome.  Also waiting for hospital bed delivery.    LOS: 33 days    Lorella Nimrod, MD [facility] Pager [phone]

## 2020-08-26 NOTE — Progress Notes (Signed)
[place] for Electrolyte Monitoring and Replacement   Recent Labs: Potassium (mmol/L)  Date Value  08/26/2020 3.4 (L)   Magnesium (mg/dL)  Date Value  08/26/2020 1.8   Calcium (mg/dL)  Date Value  08/26/2020 10.4 (H)   Calcium, Total (PTH) (mg/dL)  Date Value  07/24/2020 10.1   Albumin (g/dL)  Date Value  08/25/2020 3.1 (L)  10/22/2019 4.2   Phosphorus (mg/dL)  Date Value  08/26/2020 2.7   Sodium (mmol/L)  Date Value  08/26/2020 137  10/22/2019 139   Assessment: 64 year old female with PMHx of HTN, HLD, primary hyperparathyroidism who was admitted with COVID-19 PNA, also with A-fib with RVR and severe intertrigo. Patient extubated 10/12. Patient has been weaned to room air.   Patient was NPO secondary to aspiration events. Patient seen by SLP a couple of days ago. Cleared patient to take medications crushed in puree. Dysphagia 1 diet has been ordered. Patient has converted to NSR. Patient previously refusing to take PO medications but agreed to take them 10/21. Now on oral amiodarone. Heart rate well controlled at this time.   MIVF: D5-1/2 NS + 20 mEq K/L at 75 mL/hr Diuresis: IV Lasix 20 mg BID (d/c 10/18) UOP: 1900 mL yesterday  Goal of Therapy:  Potassium 4.0 - 5.1 mmol/L Magnesium 2.0 - 2.4 mg/dL All Other Electrolytes WNL  Plan:   Potassium improved. Will continue MIVF to D5-1/2 NS + 20 mEq K/L at 75 mL/hr (36 mEq K/day) plus KCl 40 mEq x 1 PO.  Mg 2 g IV x 1.   Will follow up with electrolytes with AM labs.   Oswald Hillock, PharmD, BCPS 08/26/2020 8:39 AM

## 2020-08-26 NOTE — Progress Notes (Signed)
PT Contact Note  Order placed by physician for DME recs for possible discharge home, as patient/family currently declining transition to STR.  If discharge home is pursued, recommend equipment for total/dependant care in home environment: hospital bed, hoyer lift, manual WC with cushion and BSC; 24/7 supervision/assist and maximum available HH services (PT, OT, RN, aide, CSW).  Pat Sires H. Owens Shark, PT, DPT, NCS 08/26/20, 4:37 PM [phone]

## 2020-08-26 NOTE — TOC Progression Note (Signed)
Transition of Care [facility]) - Progression Note    Patient Details  Name: Courtney Anderson MRN: [id number] Date of Birth: 19-May-1956  [facility] CM/SW Contact  Truitt Merle, LCSW Phone Number: 08/26/2020, 5:10 PM  Clinical Narrative:    Received secure chat from Dr. Reesa Chew stating patient had not received DME equipment. Called patient's son, but no answer-left voicemail. Spoke with Jeanella Anton with [employer] to inquire about equipment order for patient. Jeanella Anton stated they had not received a request. Placed order for hospital bed, hoyer lift, bedside commode, and manual wheelchair with cushion. Jeanella Anton stated hospital beds are not delivered on weekends, but can be delivered on Monday, at the latest Tuesday. Helene Kelp with [facility] updated about pending discharge. Dr. Reesa Chew updated via secure chat.       Expected Discharge Plan and Services                                                 Social Determinants of Health (SDOH) Interventions    Readmission Risk Interventions No flowsheet data found.

## 2020-08-26 NOTE — Progress Notes (Signed)
Patient Name: Courtney Anderson Date of Encounter: 08/26/2020  Hospital Problem List     Principal Problem:   Sepsis [facility]) Active Problems:   Hypertension   Morbid obesity ([place])   Hypercalcemia   Hyperthyroidism   AKI (acute kidney injury) ([place])   COVID-19   Atrial fibrillation with RVR ([place])   Hypokalemia   Skin ulcer ([place])    Patient Profile        64 yo with history of paroxysmal afib, covid 19 sepsis/pneumonia .Marland Kitchen On amiodarone now able to take oral medication management  and having no current evidence of significant cardiovascular symptoms and/or myocardial infarction or congestive heart failure     Subjective   Patient slowly improving but still short of breath with any physical activity and weak and fatigued.  Patient rested well overnight with no further evidence of significant recurrent atrial fibrillation or other significant cardiovascular symptoms  Inpatient Medications    . amiodarone  200 mg Oral Daily  . apixaban  5 mg Oral BID  . aspirin  81 mg Oral Daily  . Chlorhexidine Gluconate Cloth  6 each Topical Daily  . cholecalciferol  2,000 Units Oral Daily  . collagenase   Topical Daily  . insulin aspart  0-9 Units Subcutaneous Q4H  . mouth rinse  15 mL Mouth Rinse q12n4p  . multivitamin with minerals  1 tablet Oral Daily  . multivitamin-lutein  1 capsule Oral Daily  . nystatin   Topical TID  . pneumococcal 23 valent vaccine  0.5 mL Intramuscular Tomorrow-1000    Vital Signs    Vitals:   08/26/20 0324 08/26/20 0500 08/26/20 0600 08/26/20 0740  BP: 129/83   123/71  Pulse: 75   74  Resp: 20  20 20   Temp: 97.8 F (36.6 C)   97.7 F (36.5 C)  TempSrc: Oral   Oral  SpO2: 95% 93% 97% 96%  Weight:      Height:        Intake/Output Summary (Last 24 hours) at 08/26/2020 0817 Last data filed at 08/26/2020 0740 Gross per 24 hour  Intake --  Output 1400 ml  Net -1400 ml   Filed Weights   08/22/20 0413 08/24/20 0414 08/25/20 0402  Weight: 94.9 kg  97.3 kg 97.5 kg    Physical Exam    GEN: Well nourished, well developed, in no acute distress.  HEENT: normal.  Neck: Supple, no JVD, carotid bruits, or masses. Cardiac: RRR, no murmurs, rubs, or gallops. No clubbing, cyanosis, edema.  Radials/DP/PT 2+ and equal bilaterally.  Respiratory:  Respirations regular and unlabored, few crackles and wheezes bilaterally GI: Soft, nontender, nondistended, BS + x 4. MS: no deformity or atrophy. Skin: warm and dry, no rash. Neuro:  Strength and sensation are intact. Psych: Normal affect.  Labs    CBC Recent Labs    08/24/20 0540  WBC 6.6  HGB 12.6  HCT 39.6  MCV 86.1  PLT 956*   Basic Metabolic Panel Recent Labs    08/25/20 0550 08/26/20 0420  NA 136 137  K 3.4* 3.4*  CL 103 105  CO2 25 23  GLUCOSE 103* 120*  BUN 7* 6*  CREATININE 1.08* 1.02*  CALCIUM 10.3 10.4*  MG 1.9 1.8  PHOS 2.2* 2.7   Liver Function Tests Recent Labs    08/25/20 0550  AST 23  ALT 18  ALKPHOS 66  BILITOT 0.8  PROT 7.2  ALBUMIN 3.1*   No results for input(s): LIPASE, AMYLASE in the  last 72 hours. Cardiac Enzymes No results for input(s): CKTOTAL, CKMB, CKMBINDEX, TROPONINI in the last 72 hours. BNP No results for input(s): BNP in the last 72 hours. D-Dimer No results for input(s): DDIMER in the last 72 hours. Hemoglobin A1C No results for input(s): HGBA1C in the last 72 hours. Fasting Lipid Panel No results for input(s): CHOL, HDL, LDLCALC, TRIG, CHOLHDL, LDLDIRECT in the last 72 hours. Thyroid Function Tests No results for input(s): TSH, T4TOTAL, T3FREE, THYROIDAB in the last 72 hours.  Invalid input(s): FREET3  Telemetry    Normal sinus rhythm  ECG    No ischemia.  Normal sinus rhythm  Radiology    DG Chest 1 View  Result Date: 07/29/2020 CLINICAL DATA:  Initial evaluation for acute tachycardia. History of COVID pneumonia. EXAM: CHEST  1 VIEW COMPARISON:  Prior CTA from 07/28/2020 and radiograph from 07/24/2020. FINDINGS:  Transverse heart size stable. Mediastinal silhouette within normal limits. Lungs mildly hypoinflated. Severe diffuse bilateral airspace disease, right greater than left, progressed and worsened as compared to prior radiograph from 07/24/2020. Scattered air bronchograms noted at the lung bases bilaterally. Underlying pulmonary vascular congestion without overt pulmonary edema. No visible pleural effusion. No pneumothorax. Osseous structures unchanged. IMPRESSION: Severe diffuse bilateral airspace disease, right greater than left, progressed and worsened as compared to previous, concerning for worsened multifocal pneumonia. Electronically Signed   By: Jeannine Boga M.D.   On: 07/29/2020 05:47   DG Abd 1 View  Result Date: 08/11/2020 CLINICAL DATA:  OG tube placement EXAM: ABDOMEN - 1 VIEW COMPARISON:  Radiograph 08/01/2020 FINDINGS: Transesophageal tube tip and side port distal to the GE junction, terminating at the level of the gastric antrum/proximal duodenum bulb. Persistent heterogeneous airspace opacities present in the lung bases likely with associated atelectasis and trace effusions. Multiple air-filled loops of small bowel and colon without frank dilatation or high-grade obstructive pattern. No suspicious calcifications. Likely edematous changes in the soft tissues. No acute osseous abnormality. IMPRESSION: 1. Transesophageal tube tip and side port distal to the GE junction, terminating at the level of the gastric antrum/proximal duodenum bulb. 2. Persistent bibasilar heterogeneous airspace opacities likely with atelectasis and trace effusions. Electronically Signed   By: Lovena Le M.D.   On: 08/11/2020 19:22   DG Abd 1 View  Result Date: 08/01/2020 CLINICAL DATA:  NG placement. EXAM: ABDOMEN - 1 VIEW COMPARISON:  Earlier today FINDINGS: Tip and side port of the enteric tube are now located below the diaphragm in the stomach. Similar bowel-gas pattern tear earlier today. IMPRESSION: Tip and  side port of the enteric tube below the diaphragm in the stomach. Electronically Signed   By: Keith Rake M.D.   On: 08/01/2020 21:56   DG Abd 1 View  Result Date: 08/01/2020 CLINICAL DATA:  OG tube placement EXAM: ABDOMEN - 1 VIEW COMPARISON:  07/31/2020 FINDINGS: Esophageal tube tip in the region of GE junction, side-port the level of distal esophagus. Upper abdominal gas pattern is unobstructed. Airspace disease at both bases. IMPRESSION: Esophageal tube tip overlies the GE junction, side-port overlies distal esophagus, consider further advancement by at least 10 cm for more optimal positioning. These results will be called to the ordering clinician or representative by the Radiologist Assistant, and communication documented in the PACS or [employer]. Electronically Signed   By: Donavan Foil M.D.   On: 08/01/2020 20:22   DG Abd 1 View  Result Date: 07/31/2020 CLINICAL DATA:  Orogastric tube placement EXAM: ABDOMEN - 1 VIEW COMPARISON:  July 30, 2020 FINDINGS: Orogastric tube tip is at the gastroesophageal junction. No bowel dilatation or air-fluid level to suggest bowel obstruction. No free air. IMPRESSION: Orogastric tube tip at gastroesophageal junction. Advise advancing orogastric tube 8-10 cm. No bowel obstruction or free air evident. Electronically Signed   By: Lowella Grip III M.D.   On: 07/31/2020 12:15   DG Abd 1 View  Result Date: 07/30/2020 CLINICAL DATA:  Evaluate enteric tube. EXAM: ABDOMEN - 1 VIEW COMPARISON:  Earlier same day FINDINGS: Enteric tube tip and side port projects over the central upper abdomen. Nonobstructed bowel gas pattern. Osseous structures unremarkable. IMPRESSION: Interval advancement of the enteric tube with the tip and side port projecting over the central upper abdomen. Recommend advancing the tube an additional 5 cm. Electronically Signed   By: Lovey Newcomer M.D.   On: 07/30/2020 16:49   DG Abd 1 View  Result Date: 07/30/2020 CLINICAL DATA:   COVID positive patient.  OG tube evaluation EXAM: ABDOMEN - 1 VIEW COMPARISON:  None. FINDINGS: Enteric tube tip and side-port project within the distal esophagus, recommend advancement. Patchy airspace opacities lower lungs bilaterally. IMPRESSION: Enteric tube tip and side-port project within the distal esophagus, recommend advancement. These results will be called to the ordering clinician or representative by the Radiologist Assistant, and communication documented in the PACS or [employer]. Electronically Signed   By: Lovey Newcomer M.D.   On: 07/30/2020 13:11   CT ANGIO CHEST PE W OR WO CONTRAST  Result Date: 07/28/2020 CLINICAL DATA:  Respiratory failure. COVID positive. Rule out pulmonary embolism. EXAM: CT ANGIOGRAPHY CHEST WITH CONTRAST TECHNIQUE: Multidetector CT imaging of the chest was performed using the standard protocol during bolus administration of intravenous contrast. Multiplanar CT image reconstructions and MIPs were obtained to evaluate the vascular anatomy. CONTRAST:  16mL OMNIPAQUE IOHEXOL 350 MG/ML SOLN COMPARISON:  Chest 07/24/2020 FINDINGS: Cardiovascular: Excellent pulmonary artery opacification. Negative for pulmonary embolism. Moderate pulmonary artery enlargement compatible with pulmonary artery hypertension. Main pulmonary artery 33 mm in diameter. Cardiac enlargement. No pericardial effusion. Thoracic aorta normal in caliber. No significant opacification of the aorta. Mediastinum/Nodes: Negative for mediastinal mass or adenopathy. Lungs/Pleura: Severe diffuse bilateral airspace disease most prominent in the bases. No pleural effusion. Upper Abdomen: Negative Musculoskeletal: No acute skeletal abnormality. Review of the MIP images confirms the above findings. IMPRESSION: Negative for pulmonary embolism.  Pulmonary artery hypertension. Severe bilateral airspace disease most prominent the bases compatible with COVID pneumonia. Electronically Signed   By: Franchot Gallo M.D.    On: 07/28/2020 12:21   DG Chest Port 1 View  Result Date: 08/18/2020 CLINICAL DATA:  Aspiration EXAM: PORTABLE CHEST 1 VIEW COMPARISON:  08/11/2020 FINDINGS: Endotracheal tube is no longer present. Unchanged position of left IJ central venous catheter. Bilateral airspace opacities are unchanged. Small pleural effusions. IMPRESSION: Unchanged bilateral airspace opacities. Electronically Signed   By: Ulyses Jarred M.D.   On: 08/18/2020 00:53   DG Chest Port 1 View  Result Date: 08/11/2020 CLINICAL DATA:  64 year old female with respiratory failure. EXAM: PORTABLE CHEST 1 VIEW COMPARISON:  Chest radiograph dated 08/20/2020. FINDINGS: Endotracheal tube with tip in the proximal right mainstem bronchus. Recommend retraction by approximately 4 cm. Left IJ central venous line in similar position. Bilateral pulmonary densities, right greater left, appears similar to prior radiograph. Small bilateral pleural effusions may be present. No pneumothorax. Stable cardiac silhouette. No acute osseous pathology. IMPRESSION: 1. Endotracheal tube with tip in the proximal right mainstem bronchus. Recommend retraction by approximately 4  cm. 2. No significant interval change in bilateral pulmonary opacities. These results were called by telephone at the time of interpretation on 08/11/2020 at 3:39 pm to provider Adalberto Ill, who verbally acknowledged these results. Electronically Signed   By: Anner Crete M.D.   On: 08/11/2020 15:50   DG Chest Port 1 View  Result Date: 08/10/2020 CLINICAL DATA:  Acute respiratory failure. EXAM: PORTABLE CHEST 1 VIEW COMPARISON:  08/07/2020.  07/31/2020. FINDINGS: Endotracheal tube, NG tube, left IJ line in stable position. Stable cardiomegaly. Multifocal bilateral pulmonary infiltrates are again noted. Small bilateral pleural effusions. No pneumothorax. IMPRESSION: 1. Lines and tubes in stable position. 2. Stable cardiomegaly. 3. Multifocal bilateral pulmonary infiltrates again noted. No  interim change. Electronically Signed   By: Marcello Moores  Register   On: 08/10/2020 05:13   DG Chest Port 1 View  Result Date: 08/07/2020 CLINICAL DATA:  COVID-19 pneumonia. EXAM: PORTABLE CHEST 1 VIEW COMPARISON:  Abdominal x-ray dated August 01, 2020. Chest x-ray dated July 31, 2020. FINDINGS: Unchanged endotracheal and enteric tubes. Unchanged left internal jugular central venous catheter. Stable cardiomediastinal silhouette. Poor inspiratory effort compared to the prior study. Peripheral and basilar predominant patchy airspace disease in the right greater than left lungs is not significantly changed. No pneumothorax or pleural effusion. No acute osseous abnormality. IMPRESSION: 1. Unchanged multifocal pneumonia. Electronically Signed   By: Titus Dubin M.D.   On: 08/07/2020 10:00   DG Chest Port 1 View  Result Date: 07/31/2020 CLINICAL DATA:  Orogastric tube placement EXAM: PORTABLE CHEST 1 VIEW COMPARISON:  July 30, 2020. FINDINGS: Endotracheal tube tip is 6.0 cm above carina. Orogastric tube tip is at the gastroesophageal junction. Left jugular catheter tip is in the superior vena cava. No pneumothorax. Patchy airspace opacity is present bilaterally, primarily in the periphery of the upper and lower lung regions, essentially stable. Heart is upper normal in size with pulmonary vascularity normal. No adenopathy. No bone lesions. IMPRESSION: Tube and catheter positions as described without pneumothorax. Note that the orogastric tube tip is at the gastroesophageal junction. Advise advancing orogastric tube 8-10 cm. Patchy airspace opacity bilaterally, stable. Stable cardiac silhouette. Electronically Signed   By: Lowella Grip III M.D.   On: 07/31/2020 12:14   DG Chest Port 1 View  Result Date: 07/30/2020 CLINICAL DATA:  Worsening COVID pneumonia.  Intubation. EXAM: PORTABLE CHEST 1 VIEW COMPARISON:  07/29/2020 FINDINGS: The endotracheal tube is 4.3 cm above the carina. The left IJ  catheter tip is in the distal SVC near the cavoatrial junction. The NG tube is coursing down the esophagus and into the stomach. The tip is in the fundal region and the proximal port is near the GE junction. This could be advanced several cm. Persistent diffuse interstitial and airspace process in the lungs but slight improved aeration after intubation. No pneumothorax. IMPRESSION: 1. Persistent infiltrates but slight improved aeration after intubation. 2. ET tube and left IJ catheter in good position. 3. NG tube tip in the fundal region and proximal port is near the GE junction. This could be advanced several cm. Electronically Signed   By: Marijo Sanes M.D.   On: 07/30/2020 13:11    Assessment & Plan    Principal Problem:   Sepsis ([place]) Active Problems:   Hypertension   Morbid obesity ([place])   Hypercalcemia   Hyperthyroidism   AKI (acute kidney injury) ([place])   COVID-19 virus infection   Atrial fibrillation with RVR ([facility])   Hypokalemia     1.  Paroxysmal  atrial fibrillation with rapid ventricular rate, exacerbated by probable aspiration pneumonia and respiratory failure requiring intubation. She has been on amiodarone drip,   IV digoxin and heparin drip due to aspiration previously.  No obvious aspiration on SLP bedside evaluation.     She converted to sinus rhythm at a rate of 90 bpm.now able to take oral amiodarone at 200 mg daily for continued maintenance of normal sinus rhythm.  Also has converted to intravenous heparin to Eliquis 5 mg twice per day for further risk reduction stroke.  Metoprolol will be considered orally if necessary for heart rate control if patient has episode of atrial fibrillation No further cardiac diagnostics necessary at this time due to improvements of symptoms and maintenance of normal sinus rhythm  2.  COVID-19 pneumonia/sepsis, extubated, followed by vomiting and probable aspiration pneumonia with recurre nt respiratory failure requiring intubation, now extubated  and appears to be doing well.      3.  Begin ambulation and rehabilitation from above issues.  If patient is continuing to do well with current medical regimen including amiodarone and Eliquis at current dose would not change at this time and okay for discharge home from cardiac standpoint with follow-up in 1 to 2 weeks  The patient has been interviewed and examined. I agree with assessment and plan above. Serafina Royals MD [facility]

## 2020-08-26 NOTE — Progress Notes (Signed)
Attemped PIV for Central line d/c using u/s x 1 unsuccessful attempt. Patient tolerated poorly and refuses further IV attempts. Patient RN aware and will notify MD.

## 2020-08-26 NOTE — Progress Notes (Signed)
Pt refused order to remove central line/IJ catheter. After multiple attempts, education, and the involvement of family, patient continued to refuse. Dr. Reesa Chew made aware.

## 2020-08-27 DIAGNOSIS — I10 Essential (primary) hypertension: Secondary | ICD-10-CM | POA: Diagnosis not present

## 2020-08-27 DIAGNOSIS — U071 COVID-19: Secondary | ICD-10-CM | POA: Diagnosis not present

## 2020-08-27 DIAGNOSIS — I4891 Unspecified atrial fibrillation: Secondary | ICD-10-CM | POA: Diagnosis not present

## 2020-08-27 DIAGNOSIS — A419 Sepsis, unspecified organism: Secondary | ICD-10-CM | POA: Diagnosis not present

## 2020-08-27 LAB — BASIC METABOLIC PANEL
Anion gap: 6 (ref 5–15)
BUN: 8 mg/dL (ref 8–23)
CO2: 24 mmol/L (ref 22–32)
Calcium: 9.9 mg/dL (ref 8.9–10.3)
Chloride: 107 mmol/L (ref 98–111)
Creatinine, Ser: 1.19 mg/dL — ABNORMAL HIGH (ref 0.44–1.00)
GFR, Estimated: 51 mL/min — ABNORMAL LOW (ref >60)
Glucose, Bld: 102 mg/dL — ABNORMAL HIGH (ref 70–99)
Potassium: 3.4 mmol/L — ABNORMAL LOW (ref 3.5–5.1)
Sodium: 137 mmol/L (ref 135–145)

## 2020-08-27 LAB — GLUCOSE, CAPILLARY
Glucose-Capillary: 102 mg/dL — ABNORMAL HIGH (ref 70–99)
Glucose-Capillary: 102 mg/dL — ABNORMAL HIGH (ref 70–99)
Glucose-Capillary: 109 mg/dL — ABNORMAL HIGH (ref 70–99)
Glucose-Capillary: 112 mg/dL — ABNORMAL HIGH (ref 70–99)
Glucose-Capillary: 75 mg/dL (ref 70–99)
Glucose-Capillary: 88 mg/dL (ref 70–99)
Glucose-Capillary: 92 mg/dL (ref 70–99)

## 2020-08-27 LAB — CBC
HCT: 35.9 % — ABNORMAL LOW (ref 36.0–46.0)
Hemoglobin: 11.7 g/dL — ABNORMAL LOW (ref 12.0–15.0)
MCH: 27.7 pg (ref 26.0–34.0)
MCHC: 32.6 g/dL (ref 30.0–36.0)
MCV: 85.1 fL (ref 80.0–100.0)
Platelets: 214 10*3/uL (ref 150–400)
RBC: 4.22 MIL/uL (ref 3.87–5.11)
RDW: 16.9 % — ABNORMAL HIGH (ref 11.5–15.5)
WBC: 6.3 10*3/uL (ref 4.0–10.5)
nRBC: 0.3 % — ABNORMAL HIGH (ref 0.0–0.2)

## 2020-08-27 LAB — PHOSPHORUS: Phosphorus: 3.2 mg/dL (ref 2.5–4.6)

## 2020-08-27 LAB — MAGNESIUM: Magnesium: 2.4 mg/dL (ref 1.7–2.4)

## 2020-08-27 MED ORDER — POTASSIUM CHLORIDE 20 MEQ PO PACK
40.0000 meq | PACK | ORAL | Status: AC
  Administered 2020-08-27 (×2): 40 meq via ORAL
  Filled 2020-08-27 (×3): qty 2

## 2020-08-27 NOTE — Progress Notes (Signed)
Patient Name: Courtney Anderson Date of Encounter: 08/27/2020  Hospital Problem List     Principal Problem:   Sepsis [facility]) Active Problems:   Hypertension   Morbid obesity ([place])   Hypercalcemia   Hyperthyroidism   AKI (acute kidney injury) ([place])   COVID-19   Atrial fibrillation with RVR ([place])   Hypokalemia   Skin ulcer ([place])    Patient Profile        64 yo with history of paroxysmal afib, covid 19 sepsis/pneumonia .Marland Kitchen On amiodarone now able to take oral medication management  and having no current evidence of significant cardiovascular symptoms and/or myocardial infarction or congestive heart failure     Subjective   Patient slowly improving but still short of breath with any physical activity and weak and fatigued which this may be secondary to concerns of her recent hospitalization and infection rather than atrial fibrillation..  Patient rested well overnight with no further evidence of significant recurrent atrial fibrillation or other significant cardiovascular symptoms.  Patient is restless for possible discharged home  Inpatient Medications    . amiodarone  200 mg Oral Daily  . apixaban  5 mg Oral BID  . aspirin  81 mg Oral Daily  . Chlorhexidine Gluconate Cloth  6 each Topical Daily  . cholecalciferol  2,000 Units Oral Daily  . collagenase   Topical Daily  . insulin aspart  0-9 Units Subcutaneous Q4H  . mouth rinse  15 mL Mouth Rinse q12n4p  . multivitamin with minerals  1 tablet Oral Daily  . multivitamin-lutein  1 capsule Oral Daily  . nystatin   Topical TID  . pneumococcal 23 valent vaccine  0.5 mL Intramuscular Tomorrow-1000  . potassium chloride  40 mEq Oral Q4H    Vital Signs    Vitals:   08/26/20 1110 08/26/20 1644 08/26/20 1928 08/27/20 0424  BP: (!) 121/99 100/61 (!) 141/66 115/67  Pulse: 77 67 73 70  Resp: 17 17 20  (!) 22  Temp: 97.6 F (36.4 C) 97.7 F (36.5 C) 98.3 F (36.8 C)   TempSrc: Oral Oral Oral   SpO2: (!) 85% 96% 95%   Weight:       Height:        Intake/Output Summary (Last 24 hours) at 08/27/2020 0837 Last data filed at 08/27/2020 0655 Gross per 24 hour  Intake 1792.43 ml  Output 300 ml  Net 1492.43 ml   Filed Weights   08/22/20 0413 08/24/20 0414 08/25/20 0402  Weight: 94.9 kg 97.3 kg 97.5 kg    Physical Exam    GEN: Well nourished, well developed, in no acute distress.  HEENT: normal.  Neck: Supple, no JVD, carotid bruits, or masses. Cardiac: RRR, no murmurs, rubs, or gallops. No clubbing, cyanosis, edema.  Radials/DP/PT 2+ and equal bilaterally.  Respiratory:  Respirations regular and unlabored, few crackles and wheezes bilaterally GI: Soft, nontender, nondistended, BS + x 4. MS: no deformity or atrophy. Skin: warm and dry, no rash. Neuro:  Strength and sensation are intact. Psych: Normal affect.  Labs    CBC Recent Labs    08/27/20 0312  WBC 6.3  HGB 11.7*  HCT 35.9*  MCV 85.1  PLT 175   Basic Metabolic Panel Recent Labs    08/26/20 0420 08/27/20 0312  NA 137 137  K 3.4* 3.4*  CL 105 107  CO2 23 24  GLUCOSE 120* 102*  BUN 6* 8  CREATININE 1.02* 1.19*  CALCIUM 10.4* 9.9  MG 1.8 2.4  PHOS  2.7 3.2   Liver Function Tests Recent Labs    08/25/20 0550  AST 23  ALT 18  ALKPHOS 66  BILITOT 0.8  PROT 7.2  ALBUMIN 3.1*   No results for input(s): LIPASE, AMYLASE in the last 72 hours. Cardiac Enzymes No results for input(s): CKTOTAL, CKMB, CKMBINDEX, TROPONINI in the last 72 hours. BNP No results for input(s): BNP in the last 72 hours. D-Dimer No results for input(s): DDIMER in the last 72 hours. Hemoglobin A1C No results for input(s): HGBA1C in the last 72 hours. Fasting Lipid Panel No results for input(s): CHOL, HDL, LDLCALC, TRIG, CHOLHDL, LDLDIRECT in the last 72 hours. Thyroid Function Tests No results for input(s): TSH, T4TOTAL, T3FREE, THYROIDAB in the last 72 hours.  Invalid input(s): FREET3  Telemetry    Normal sinus rhythm  ECG    No ischemia.  Normal  sinus rhythm  Radiology    DG Chest 1 View  Result Date: 07/29/2020 CLINICAL DATA:  Initial evaluation for acute tachycardia. History of COVID pneumonia. EXAM: CHEST  1 VIEW COMPARISON:  Prior CTA from 07/28/2020 and radiograph from 07/24/2020. FINDINGS: Transverse heart size stable. Mediastinal silhouette within normal limits. Lungs mildly hypoinflated. Severe diffuse bilateral airspace disease, right greater than left, progressed and worsened as compared to prior radiograph from 07/24/2020. Scattered air bronchograms noted at the lung bases bilaterally. Underlying pulmonary vascular congestion without overt pulmonary edema. No visible pleural effusion. No pneumothorax. Osseous structures unchanged. IMPRESSION: Severe diffuse bilateral airspace disease, right greater than left, progressed and worsened as compared to previous, concerning for worsened multifocal pneumonia. Electronically Signed   By: Jeannine Boga M.D.   On: 07/29/2020 05:47   DG Abd 1 View  Result Date: 08/11/2020 CLINICAL DATA:  OG tube placement EXAM: ABDOMEN - 1 VIEW COMPARISON:  Radiograph 08/01/2020 FINDINGS: Transesophageal tube tip and side port distal to the GE junction, terminating at the level of the gastric antrum/proximal duodenum bulb. Persistent heterogeneous airspace opacities present in the lung bases likely with associated atelectasis and trace effusions. Multiple air-filled loops of small bowel and colon without frank dilatation or high-grade obstructive pattern. No suspicious calcifications. Likely edematous changes in the soft tissues. No acute osseous abnormality. IMPRESSION: 1. Transesophageal tube tip and side port distal to the GE junction, terminating at the level of the gastric antrum/proximal duodenum bulb. 2. Persistent bibasilar heterogeneous airspace opacities likely with atelectasis and trace effusions. Electronically Signed   By: Lovena Le M.D.   On: 08/11/2020 19:22   DG Abd 1 View  Result  Date: 08/01/2020 CLINICAL DATA:  NG placement. EXAM: ABDOMEN - 1 VIEW COMPARISON:  Earlier today FINDINGS: Tip and side port of the enteric tube are now located below the diaphragm in the stomach. Similar bowel-gas pattern tear earlier today. IMPRESSION: Tip and side port of the enteric tube below the diaphragm in the stomach. Electronically Signed   By: Keith Rake M.D.   On: 08/01/2020 21:56   DG Abd 1 View  Result Date: 08/01/2020 CLINICAL DATA:  OG tube placement EXAM: ABDOMEN - 1 VIEW COMPARISON:  07/31/2020 FINDINGS: Esophageal tube tip in the region of GE junction, side-port the level of distal esophagus. Upper abdominal gas pattern is unobstructed. Airspace disease at both bases. IMPRESSION: Esophageal tube tip overlies the GE junction, side-port overlies distal esophagus, consider further advancement by at least 10 cm for more optimal positioning. These results will be called to the ordering clinician or representative by the Radiologist Assistant, and communication documented in the  PACS or [employer]. Electronically Signed   By: Donavan Foil M.D.   On: 08/01/2020 20:22   DG Abd 1 View  Result Date: 07/31/2020 CLINICAL DATA:  Orogastric tube placement EXAM: ABDOMEN - 1 VIEW COMPARISON:  July 30, 2020 FINDINGS: Orogastric tube tip is at the gastroesophageal junction. No bowel dilatation or air-fluid level to suggest bowel obstruction. No free air. IMPRESSION: Orogastric tube tip at gastroesophageal junction. Advise advancing orogastric tube 8-10 cm. No bowel obstruction or free air evident. Electronically Signed   By: Lowella Grip III M.D.   On: 07/31/2020 12:15   DG Abd 1 View  Result Date: 07/30/2020 CLINICAL DATA:  Evaluate enteric tube. EXAM: ABDOMEN - 1 VIEW COMPARISON:  Earlier same day FINDINGS: Enteric tube tip and side port projects over the central upper abdomen. Nonobstructed bowel gas pattern. Osseous structures unremarkable. IMPRESSION: Interval advancement of  the enteric tube with the tip and side port projecting over the central upper abdomen. Recommend advancing the tube an additional 5 cm. Electronically Signed   By: Lovey Newcomer M.D.   On: 07/30/2020 16:49   DG Abd 1 View  Result Date: 07/30/2020 CLINICAL DATA:  COVID positive patient.  OG tube evaluation EXAM: ABDOMEN - 1 VIEW COMPARISON:  None. FINDINGS: Enteric tube tip and side-port project within the distal esophagus, recommend advancement. Patchy airspace opacities lower lungs bilaterally. IMPRESSION: Enteric tube tip and side-port project within the distal esophagus, recommend advancement. These results will be called to the ordering clinician or representative by the Radiologist Assistant, and communication documented in the PACS or [employer]. Electronically Signed   By: Lovey Newcomer M.D.   On: 07/30/2020 13:11   CT ANGIO CHEST PE W OR WO CONTRAST  Result Date: 07/28/2020 CLINICAL DATA:  Respiratory failure. COVID positive. Rule out pulmonary embolism. EXAM: CT ANGIOGRAPHY CHEST WITH CONTRAST TECHNIQUE: Multidetector CT imaging of the chest was performed using the standard protocol during bolus administration of intravenous contrast. Multiplanar CT image reconstructions and MIPs were obtained to evaluate the vascular anatomy. CONTRAST:  137mL OMNIPAQUE IOHEXOL 350 MG/ML SOLN COMPARISON:  Chest 07/24/2020 FINDINGS: Cardiovascular: Excellent pulmonary artery opacification. Negative for pulmonary embolism. Moderate pulmonary artery enlargement compatible with pulmonary artery hypertension. Main pulmonary artery 33 mm in diameter. Cardiac enlargement. No pericardial effusion. Thoracic aorta normal in caliber. No significant opacification of the aorta. Mediastinum/Nodes: Negative for mediastinal mass or adenopathy. Lungs/Pleura: Severe diffuse bilateral airspace disease most prominent in the bases. No pleural effusion. Upper Abdomen: Negative Musculoskeletal: No acute skeletal abnormality. Review of  the MIP images confirms the above findings. IMPRESSION: Negative for pulmonary embolism.  Pulmonary artery hypertension. Severe bilateral airspace disease most prominent the bases compatible with COVID pneumonia. Electronically Signed   By: Franchot Gallo M.D.   On: 07/28/2020 12:21   DG Chest Port 1 View  Result Date: 08/18/2020 CLINICAL DATA:  Aspiration EXAM: PORTABLE CHEST 1 VIEW COMPARISON:  08/11/2020 FINDINGS: Endotracheal tube is no longer present. Unchanged position of left IJ central venous catheter. Bilateral airspace opacities are unchanged. Small pleural effusions. IMPRESSION: Unchanged bilateral airspace opacities. Electronically Signed   By: Ulyses Jarred M.D.   On: 08/18/2020 00:53   DG Chest Port 1 View  Result Date: 08/11/2020 CLINICAL DATA:  64 year old female with respiratory failure. EXAM: PORTABLE CHEST 1 VIEW COMPARISON:  Chest radiograph dated 08/20/2020. FINDINGS: Endotracheal tube with tip in the proximal right mainstem bronchus. Recommend retraction by approximately 4 cm. Left IJ central venous line in similar position. Bilateral pulmonary  densities, right greater left, appears similar to prior radiograph. Small bilateral pleural effusions may be present. No pneumothorax. Stable cardiac silhouette. No acute osseous pathology. IMPRESSION: 1. Endotracheal tube with tip in the proximal right mainstem bronchus. Recommend retraction by approximately 4 cm. 2. No significant interval change in bilateral pulmonary opacities. These results were called by telephone at the time of interpretation on 08/11/2020 at 3:39 pm to provider Adalberto Ill, who verbally acknowledged these results. Electronically Signed   By: Anner Crete M.D.   On: 08/11/2020 15:50   DG Chest Port 1 View  Result Date: 08/10/2020 CLINICAL DATA:  Acute respiratory failure. EXAM: PORTABLE CHEST 1 VIEW COMPARISON:  08/07/2020.  07/31/2020. FINDINGS: Endotracheal tube, NG tube, left IJ line in stable position. Stable  cardiomegaly. Multifocal bilateral pulmonary infiltrates are again noted. Small bilateral pleural effusions. No pneumothorax. IMPRESSION: 1. Lines and tubes in stable position. 2. Stable cardiomegaly. 3. Multifocal bilateral pulmonary infiltrates again noted. No interim change. Electronically Signed   By: Marcello Moores  Register   On: 08/10/2020 05:13   DG Chest Port 1 View  Result Date: 08/07/2020 CLINICAL DATA:  COVID-19 pneumonia. EXAM: PORTABLE CHEST 1 VIEW COMPARISON:  Abdominal x-ray dated August 01, 2020. Chest x-ray dated July 31, 2020. FINDINGS: Unchanged endotracheal and enteric tubes. Unchanged left internal jugular central venous catheter. Stable cardiomediastinal silhouette. Poor inspiratory effort compared to the prior study. Peripheral and basilar predominant patchy airspace disease in the right greater than left lungs is not significantly changed. No pneumothorax or pleural effusion. No acute osseous abnormality. IMPRESSION: 1. Unchanged multifocal pneumonia. Electronically Signed   By: Titus Dubin M.D.   On: 08/07/2020 10:00   DG Chest Port 1 View  Result Date: 07/31/2020 CLINICAL DATA:  Orogastric tube placement EXAM: PORTABLE CHEST 1 VIEW COMPARISON:  July 30, 2020. FINDINGS: Endotracheal tube tip is 6.0 cm above carina. Orogastric tube tip is at the gastroesophageal junction. Left jugular catheter tip is in the superior vena cava. No pneumothorax. Patchy airspace opacity is present bilaterally, primarily in the periphery of the upper and lower lung regions, essentially stable. Heart is upper normal in size with pulmonary vascularity normal. No adenopathy. No bone lesions. IMPRESSION: Tube and catheter positions as described without pneumothorax. Note that the orogastric tube tip is at the gastroesophageal junction. Advise advancing orogastric tube 8-10 cm. Patchy airspace opacity bilaterally, stable. Stable cardiac silhouette. Electronically Signed   By: Lowella Grip III  M.D.   On: 07/31/2020 12:14   DG Chest Port 1 View  Result Date: 07/30/2020 CLINICAL DATA:  Worsening COVID pneumonia.  Intubation. EXAM: PORTABLE CHEST 1 VIEW COMPARISON:  07/29/2020 FINDINGS: The endotracheal tube is 4.3 cm above the carina. The left IJ catheter tip is in the distal SVC near the cavoatrial junction. The NG tube is coursing down the esophagus and into the stomach. The tip is in the fundal region and the proximal port is near the GE junction. This could be advanced several cm. Persistent diffuse interstitial and airspace process in the lungs but slight improved aeration after intubation. No pneumothorax. IMPRESSION: 1. Persistent infiltrates but slight improved aeration after intubation. 2. ET tube and left IJ catheter in good position. 3. NG tube tip in the fundal region and proximal port is near the GE junction. This could be advanced several cm. Electronically Signed   By: Marijo Sanes M.D.   On: 07/30/2020 13:11    Assessment & Plan    Principal Problem:   Sepsis ([place]) Active Problems:  Hypertension   Morbid obesity ([place])   Hypercalcemia   Hyperthyroidism   AKI (acute kidney injury) ([place])   COVID-19 virus infection   Atrial fibrillation with RVR ([facility])   Hypokalemia     1.  Paroxysmal atrial fibrillation with rapid ventricular rate, exacerbated by probable aspiration pneumonia and respiratory failure requiring intubation. She has been on amiodarone drip,   IV digoxin and heparin drip due to aspiration previously.  No obvious aspiration on SLP bedside evaluation.     She converted to sinus rhythm at a rate of 90 bpm.now able to take oral amiodarone at 200 mg daily for continued maintenance of normal sinus rhythm.  Also has converted to intravenous heparin to Eliquis 5 mg twice per day for further risk reduction stroke.  Metoprolol will be considered orally if necessary for heart rate control if patient has episode of atrial fibrillation No further cardiac diagnostics  necessary at this time due to improvements of symptoms and maintenance of normal sinus rhythm And okay for discharged home from cardiac standpoint or to a facility to help manage above 2.  COVID-19 pneumonia/sepsis, extubated, followed by vomiting and probable aspiration pneumonia with recurre nt respiratory failure requiring intubation, now extubated and appears to be doing well.      3.  Begin ambulation and rehabilitation from above issues.  If patient is continuing to do well with current medical regimen including amiodarone and Eliquis at current dose would not change at this time and okay for discharge home from cardiac standpoint with follow-up in 1 to 2 weeks  The patient has been interviewed and examined. I agree with assessment and plan above. Serafina Royals MD [facility]

## 2020-08-27 NOTE — Progress Notes (Signed)
SATURATION QUALIFICATIONS: (This note is used to comply with regulatory documentation for home oxygen)  Patient Saturations on Room Air at Rest = 88%  Patient Saturations on 2L Liters of oxygen while Ambulating = 92%  Please briefly explain why patient needs home oxygen: de stats at rest, intermittently

## 2020-08-27 NOTE — Progress Notes (Signed)
PROGRESS NOTE    NIKOLETTA VARMA  BDZ:[id number] DOB: 17-Jun-1956 DOA: 07/24/2020 PCP: Guadalupe Maple, MD    Brief Narrative:  Solita Macadam Wattis an 64 y.o.female.  [id number] is a 64 year old female with history noted below, who was admitted to [facility] on 2020-07-24 brought in via EMS as a "code sepsis". Patient subsequently tested positive for COVID-19. She presented with oxygen saturations of 69% on room air and had to be placed on nonrebreather mask to achieve oxygen saturations in the 90s. She was subsequently admitted to the Covid unit for management of COVID-19 pneumonia. She was noted to have acute kidney injury she was treated with remdesivir, zinc, vitamin C and antitussives. She was placed empirically on Rocephin and azithromycin. She continued to have increased FiO2 requirements. She was refusing to self prone. She had atrial fibrillation and was seen by cardiologyto assist in management.  07/31/20- patient is on 90% FiO2, will modify meds as patient is on metoprolol and levophed as well as cardizem. Will d/c propofol to improve SBP and remove arythmogenic vasopressor if able. Plan to have CVP trending and diuresis today. Goals of care discussion today - patient made DNR per sister and son of patient. I had 2 separate phone meetings with son and separate conversation with sister today to explain patient is worse with shock and renal failure requiring multiple pressors.   08/01/20- patient remains critically ill. FiO2 has been weaned down to 80% today.  08/03/20- patient remains critically ill shes being proned, weaning FiO2 as able.  08/04/20-patient with no interval changes continue with weaning and proning protocol. Continue weaning FiO2 and PEEP as to optimize for SBT 08/06/20- patient is weaned down 40%FiO2 today, will attempt awakening trial with SBT, I spoke to son [name] today, he appreciates update and does not want Elink video view at this time.  08/07/20- patient is  requiring 50%FiO2 today 08/08/20- Continuing to wean vent. FiO2 is 28%, will trial SBT today 08/09/20-tachypneic and hypertensive with SBT. Asynchronous.FiO2 back up to 50% 10/7 severeresp failure, attempt SAT/SBT 10/7 extubated to biPAP 10/8 Tachycardic , more confused,patient re-intubated ETT 8.0 10/9 remains on vent 10/11 severe resp failure on vent 10/12 failed weaning trial 10/12 successfully extubated 10/14 TRANSFER TO TRH 10/15 somnolent, sleepy , not interactive speech and swallow passed, but had aspiration event. At risk for aspiration .  10/16- mentation better today, down to 4L. Son at bedside 10/17- pt in foul mood, upset why she is here. Thinks she has been here for 160 days".  10/18- awake, calmer today, mentating well.  Refusing meds being crushed with applesauce or ice cream as speech had recommended 10/19-pt refusing to eat diet provided. Refused to take po meds crushed with apple sauce. Spoke to son today, told him about this. Also discussed her nutrition status, if she refuses she will need PEG placement. He wants to come by tonight after work to encourage her to take the food and if not, he will think about the PEG.  10/21-patient agreed to try food from home as she does not like hospital food.  She was asking for fried chicken and chips.  Encouraged to try stool and smoothies before fried food.  Patient agrees to give it a try.  Remained stable.  Consultants:   Pccm, cardiology  Procedures:  Antimicrobials:   Completed the course of antibiotics.  Subjective: Patient was very upset when seen today stating that they are not treating her well.  Apparently her [employer] was bad and  was thrown away.  Son was at bedside.  Able to answer all the orientation questions.  Wants to go home. Patient refused removal of central line yesterday and was very upset with IV team.  Discussed with patient and son both today, she wholeheartedly agrees to be taken out stating that she  does not like people touching her neck.  She also does not want another IV line to be placed.  Discussed that if she can keep herself well-hydrated we will not put another IV line as she is getting ready for discharge tomorrow.  Objective: Vitals:   08/26/20 1644 08/26/20 1928 08/27/20 0424 08/27/20 1027  BP: 100/61 (!) 141/66 115/67 (!) 160/72  Pulse: 67 73 70 78  Resp: 17 20 (!) 22 (!) 22  Temp: 97.7 F (36.5 C) 98.3 F (36.8 C)  97.8 F (36.6 C)  TempSrc: Oral Oral  Oral  SpO2: 96% 95%  95%  Weight:      Height:        Intake/Output Summary (Last 24 hours) at 08/27/2020 1234 Last data filed at 08/27/2020 1013 Gross per 24 hour  Intake 1792.43 ml  Output 700 ml  Net 1092.43 ml   Filed Weights   08/22/20 0413 08/24/20 0414 08/25/20 0402  Weight: 94.9 kg 97.3 kg 97.5 kg    Examination:  General.  Well-developed, little irritated lady, in no acute distress. Pulmonary.  Lungs clear bilaterally, normal respiratory effort. CV.  Regular rate and rhythm, no JVD, rub or murmur. Abdomen.  Soft, nontender, nondistended, BS positive. CNS.  Alert and oriented x3.  No focal neurologic deficit. Extremities.  No edema, no cyanosis, pulses intact and symmetrical. Psychiatry.  Judgment and insight appears normal.  Data Reviewed: I have personally reviewed following labs and imaging studies  CBC: Recent Labs  Lab 08/21/20 0637 08/22/20 0444 08/24/20 0540 08/27/20 0312  WBC 7.4 8.2 6.6 6.3  HGB 13.3 12.1 12.6 11.7*  HCT 41.9 38.2 39.6 35.9*  MCV 86.4 86.0 86.1 85.1  PLT 113* 150 148* 720   Basic Metabolic Panel: Recent Labs  Lab 08/23/20 0415 08/24/20 0540 08/25/20 0550 08/26/20 0420 08/27/20 0312  NA 136 137 136 137 137  K 3.2* 3.3* 3.4* 3.4* 3.4*  CL 98 101 103 105 107  CO2 29 24 25 23 24   GLUCOSE 113* 110* 103* 120* 102*  BUN 8 7* 7* 6* 8  CREATININE 0.98 0.99 1.08* 1.02* 1.19*  CALCIUM 9.9 10.4* 10.3 10.4* 9.9  MG 2.0 1.9 1.9 1.8 2.4  PHOS 2.3* 2.4* 2.2* 2.7  3.2   GFR: Estimated Creatinine Clearance: 54.5 mL/min (A) (by C-G formula based on SCr of 1.19 mg/dL (H)). Liver Function Tests: Recent Labs  Lab 08/21/20 0637 08/25/20 0550  AST 22 23  ALT 25 18  ALKPHOS 66 66  BILITOT 1.0 0.8  PROT 7.3 7.2  ALBUMIN 3.2* 3.1*   No results for input(s): LIPASE, AMYLASE in the last 168 hours. No results for input(s): AMMONIA in the last 168 hours. Coagulation Profile: No results for input(s): INR, PROTIME in the last 168 hours. Cardiac Enzymes: No results for input(s): CKTOTAL, CKMB, CKMBINDEX, TROPONINI in the last 168 hours. BNP (last 3 results) No results for input(s): PROBNP in the last 8760 hours. HbA1C: No results for input(s): HGBA1C in the last 72 hours. CBG: Recent Labs  Lab 08/26/20 1643 08/26/20 1925 08/27/20 0016 08/27/20 0425 08/27/20 1025  GLUCAP 86 111* 102* 102* 112*   Lipid Profile: No results for input(s): CHOL,  HDL, LDLCALC, TRIG, CHOLHDL, LDLDIRECT in the last 72 hours. Thyroid Function Tests: No results for input(s): TSH, T4TOTAL, FREET4, T3FREE, THYROIDAB in the last 72 hours. Anemia Panel: No results for input(s): VITAMINB12, FOLATE, FERRITIN, TIBC, IRON, RETICCTPCT in the last 72 hours. Sepsis Labs: No results for input(s): PROCALCITON, LATICACIDVEN in the last 168 hours.  No results found for this or any previous visit (from the past 240 hour(s)).    Radiology Studies: No results found.  Artery  Total 8 minutes about discharge summary that the primary vascular B.  Medical metabolic  Scheduled Meds: . amiodarone  200 mg Oral Daily  . apixaban  5 mg Oral BID  . aspirin  81 mg Oral Daily  . Chlorhexidine Gluconate Cloth  6 each Topical Daily  . cholecalciferol  2,000 Units Oral Daily  . collagenase   Topical Daily  . insulin aspart  0-9 Units Subcutaneous Q4H  . mouth rinse  15 mL Mouth Rinse q12n4p  . multivitamin with minerals  1 tablet Oral Daily  . multivitamin-lutein  1 capsule Oral Daily  .  nystatin   Topical TID  . pneumococcal 23 valent vaccine  0.5 mL Intramuscular Tomorrow-1000  . potassium chloride  40 mEq Oral Q4H   Continuous Infusions: . sodium chloride Stopped (08/26/20 1525)  . dextrose 5 % and 0.9% NaCl 100 mL/hr at 08/27/20 1016    Assessment & Plan:   Principal Problem:   Sepsis ([place]) Active Problems:   Hypertension   Morbid obesity ([place])   Hypercalcemia   Hyperthyroidism   AKI (acute kidney injury) ([place])   COVID-19   Atrial fibrillation with RVR (H[facility])   Hypokalemia   Skin ulcer ([place])   Acute hypoxemic respiratory failure due to COVID-19 pneumonia / ARDScomplicated by renal failure-resolved  s/p extubation and re-intubated for acute aspiration pneumonia At risk of aspiration, completed the course of Unasyn. Saturating well on room air -still requiring intermittent oxygen up to 2 L.  Morbid obesity, possible OSA.  -Because medical issues and respiratory status  Needs outpatient sleep study She will be discharged with home oxygen as continued to desaturate intermittently.  AFIB WITH RVR- currently in sinus rhythm Cardiology following. Transition to p.o. amiodarone as she started taking orally. Restarted Eliquis per cardiology.  Hypophosphatemia-patient is high risk for refeeding syndrome. --replete PRN and monitor  Hypokalemia. --replete PRN  Nutrition. Patient is taking few bites from the home food, still do not like the hospital food.  Discussed having a good nutrition and supplementing with Ensure or boost, she does not like them but promised to give them a trial.  Patient has her own mind and refused or take depending on her mood.  Foley catheter.  Removed on 08/24/2020.  Doing well with pure wick Foley catheter was placed when patient was in ICU.  Remained in as she developed pressure injuries in groin areas. Discussed with wound care nurse and will try taking Foley out and place pure wick for healing.  If patient continued to soil  area as she also has an history of incontinence we might have to replace Foley catheter for wound healing.  Central line.  We will remove central line today, which was ordered for yesterday but patient did not let them remove it.  Promised to do it today.  DVT prophylaxis: Eliquis Code Status: Full Code Palliative care following  Family Communication: Son was updated at bedside.  DME needs will be delivered tomorrow.  Status is: Inpatient  Dispo: The patient  is from: Home               Anticipated d/c is to: family refused SNF, needs HH on discharge to home. Home health services ordered.  Will ask TOC to look for her DME needs so she can be discharged.  She needs delivery before going home. Most likely be delivered tomorrow.  Anticipated d/c date is: 1-2 days  Patient currently is medically stable.  Patient started taking p.o., we will monitor electrolytes for another day as she is high risk for refeeding syndrome.  Also waiting for hospital bed delivery.    LOS: 34 days    Lorella Nimrod, MD [facility] Pager [phone]

## 2020-08-27 NOTE — Progress Notes (Signed)
Occupational Therapy Treatment Patient Details Name: LATANJA LEHENBAUER MRN: [id number] DOB: [birth year] Today's Date: 08/27/2020    History of present illness Almina Schul is a 43yoF PMH: HTN, hypoTSH, who comes to [facility] on 9/20 after 1 weeks of cough, congestion, weakness, intermittent fever chills. Pt admitted with code sepsis, (+) COVID19. Pt having issues with AF/RVR upon arrival.   OT comments  Ms Knopf seen for OT treatment on this date. Pt requesting to use BSC, NT enlisted OT for assistance. Family member at bedside to encourage pt. MOD A x2 to exit L side of bed, pt noted to have L lateral and posterior lean c BUE support at EOB - requires MOD A for sitting balance. Upon sitting, pt refused to trial BSC. MOD A x2 return to supine. Pt continues to be limited by cognition for mobility. Family member requesting pt be left up in chair - educated on concerns for pt ability to stay seated upright. Pt left in bed in chair position. Family educated on DME recs and d/c recs. Family continues to want to take pt home when DME is delivered. Pt making progress toward goals. Pt continues to benefit from skilled OT services to maximize return to PLOF and minimize risk of future falls, injury, caregiver burden, and readmission. Will continue to follow POC. Discharge recommendation remains appropriate.    Follow Up Recommendations  SNF    Equipment Recommendations  Other (comment) (if d/c home - hospital bed, hoyer lift, w/c, BSC)    Recommendations for Other Services      Precautions / Restrictions Precautions Precautions: Fall Restrictions Weight Bearing Restrictions: No       Mobility Bed Mobility Overal bed mobility: Needs Assistance Bed Mobility: Supine to Sit;Sit to Supine     Supine to sit: Mod assist;+2 for physical assistance Sit to supine: Mod assist;+2 for physical assistance      Transfers         General transfer comment: unsafe 2/2 cognition. pt requesting BSC then  unwilling to trial once seated EOB     Balance Overall balance assessment: Needs assistance Sitting-balance support: Bilateral upper extremity supported;Feet unsupported Sitting balance-Leahy Scale: Poor   Postural control: Posterior lean;Left lateral lean          ADL either performed or assessed with clinical judgement   ADL Overall ADL's : Needs assistance/impaired          General ADL Comments: Attempted BSC MAX +2 - pt unwillling once EOB and returned to supine               Cognition Arousal/Alertness: Awake/alert Behavior During Therapy: Flat affect Overall Cognitive Status: History of cognitive impairments  Increased time and repetition for one step commands               Exercises Exercises: Other exercises Other Exercises Other Exercises: Family and pt educated re: DME recs, d/c recs, importance of moblity for functional strengthening, fucntional application of delirium pcns, home/routines modifications Other Exercises: sup<>sit, sitting balance/tolerance      General Comments      Pertinent Vitals/ Pain       Pain Assessment: No/denies pain         Frequency  Min 2X/week        Progress Toward Goals  OT Goals(current goals can now be found in the care plan section)  Progress towards OT goals: Progressing toward goals  Acute Rehab OT Goals Patient Stated Goal: to go home OT Goal Formulation: With patient  Time For Goal Achievement: 08/31/20 Potential to Achieve Goals: Good ADL Goals Pt Will Perform Grooming: with set-up;with supervision;sitting;standing Pt Will Perform Upper Body Bathing: with supervision;sitting;standing Pt Will Transfer to Toilet: with min guard assist;bedside commode (c LRAD PRN)  Plan Discharge plan remains appropriate;Frequency remains appropriate       AM-PAC OT "6 Clicks" Daily Activity     Outcome Measure   Help from another person eating meals?: A Little Help from another person taking care of personal  grooming?: A Lot Help from another person toileting, which includes using toliet, bedpan, or urinal?: A Lot Help from another person bathing (including washing, rinsing, drying)?: A Lot Help from another person to put on and taking off regular upper body clothing?: A Lot Help from another person to put on and taking off regular lower body clothing?: A Lot 6 Click Score: 13    End of Session    OT Visit Diagnosis: Muscle weakness (generalized) (M62.81)   Activity Tolerance Other (comment) (Pt limited by cognition)   Patient Left in bed;with call bell/phone within reach;with bed alarm set;with family/visitor present   Nurse Communication          Time: 1610-9604 OT Time Calculation (min): 16 min  Charges: OT General Charges $OT Visit: 1 Visit OT Treatments $Self Care/Home Management : 8-22 mins  Dessie Coma, M.S. OTR/L  08/27/20, 4:28 PM  ascom [phone]

## 2020-08-27 NOTE — Plan of Care (Signed)

## 2020-08-27 NOTE — Progress Notes (Signed)
Central line removed by charge RN per order. Pt tolerated well, will continue to monitor.

## 2020-08-27 NOTE — TOC Progression Note (Addendum)
Transition of Care [facility]) - Progression Note    Patient Details  Name: SAMAYA BOARDLEY MRN: [id number] Date of Birth: 08/05/1956  Transition of Care [facility]) CM/SW Contact  Mekaela Azizi, Nonda Lou, [place] Phone Number: 08/27/2020, 10:01 AM  Clinical Narrative:   Discharge plan for patient- home with home health and DME. Pt. Declined SNF placement and desires to go home. Physician to place order for home DME: hospital bed, hoyer lift, bedside commode, wheelchair with cushion. [employer] to supply DME. Spoke with pt and son- Delmus at the bedside. Son shares brother BJ will be in the home to accept DME delivery and assist with patient's care. Offered SNF option and patient declined. Spoke to [employer] ([employer]) to discuss DME and new order for O2. Needs O2 sats to qualify. Spoke to Dr. Reesa Chew and Nurse Modena Nunnery about getting recent O2 sats to qualify for home O2. Will await O2 sats from nursing staff to furnish to [employer]. Confirmed address for DME company to deliver: bed, hoyer, bedside commode (3 in 1), walker, wheel chair with cushion. Spoke to [place] ([facility]) to discuss Pure [place] for at home use. Helene Kelp will confirm Medicare coverage for Pure [facility] tomorrow.         Expected Discharge Plan and Services                                                 Social Determinants of Health (SDOH) Interventions    Readmission Risk Interventions No flowsheet data found.

## 2020-08-27 NOTE — Progress Notes (Signed)
Per MD, ok to pull central line with no other PIV access established.

## 2020-08-27 NOTE — Progress Notes (Signed)
[facility] for Electrolyte Monitoring and Replacement   Recent Labs: Potassium (mmol/L)  Date Value  08/27/2020 3.4 (L)   Magnesium (mg/dL)  Date Value  08/27/2020 2.4   Calcium (mg/dL)  Date Value  08/27/2020 9.9   Calcium, Total (PTH) (mg/dL)  Date Value  07/24/2020 10.1   Albumin (g/dL)  Date Value  08/25/2020 3.1 (L)  10/22/2019 4.2   Phosphorus (mg/dL)  Date Value  08/27/2020 3.2   Sodium (mmol/L)  Date Value  08/27/2020 137  10/22/2019 139   Assessment: 64 year old female with PMHx of HTN, HLD, primary hyperparathyroidism who was admitted with COVID-19 PNA, also with A-fib with RVR and severe intertrigo. Patient extubated 10/12. Patient has been weaned to room air.   Patient was NPO secondary to aspiration events. Patient seen by SLP a couple of days ago. Cleared patient to take medications crushed in puree. Dysphagia 1 diet has been ordered. Patient has converted to NSR. Patient previously refusing to take PO medications but agreed to take them 10/21. Now on oral amiodarone. Heart rate well controlled at this time.   On D5-1/2 NS @ 100 mL/hr   Goal of Therapy:  Potassium 4.0 - 5.1 mmol/L Magnesium 2.0 - 2.4 mg/dL All Other Electrolytes WNL  Plan:   KCl 40 mEq x 2 PO.   Will follow up with electrolytes with AM labs.   Oswald Hillock, PharmD, BCPS 08/27/2020 8:08 AM

## 2020-08-28 DIAGNOSIS — T17908A Unspecified foreign body in respiratory tract, part unspecified causing other injury, initial encounter: Secondary | ICD-10-CM

## 2020-08-28 DIAGNOSIS — J9601 Acute respiratory failure with hypoxia: Secondary | ICD-10-CM

## 2020-08-28 DIAGNOSIS — L98492 Non-pressure chronic ulcer of skin of other sites with fat layer exposed: Secondary | ICD-10-CM

## 2020-08-28 DIAGNOSIS — U071 COVID-19: Secondary | ICD-10-CM | POA: Diagnosis not present

## 2020-08-28 DIAGNOSIS — N179 Acute kidney failure, unspecified: Secondary | ICD-10-CM | POA: Diagnosis not present

## 2020-08-28 DIAGNOSIS — A419 Sepsis, unspecified organism: Secondary | ICD-10-CM | POA: Diagnosis not present

## 2020-08-28 LAB — PHOSPHORUS: Phosphorus: 2.9 mg/dL (ref 2.5–4.6)

## 2020-08-28 LAB — MAGNESIUM: Magnesium: 2.1 mg/dL (ref 1.7–2.4)

## 2020-08-28 LAB — GLUCOSE, CAPILLARY
Glucose-Capillary: 77 mg/dL (ref 70–99)
Glucose-Capillary: 82 mg/dL (ref 70–99)
Glucose-Capillary: 89 mg/dL (ref 70–99)

## 2020-08-28 LAB — POTASSIUM: Potassium: 4 mmol/L (ref 3.5–5.1)

## 2020-08-28 MED ORDER — AMIODARONE HCL 200 MG PO TABS
200.0000 mg | ORAL_TABLET | Freq: Every day | ORAL | 0 refills | Status: DC
Start: 2020-08-29 — End: 2020-09-29

## 2020-08-28 MED ORDER — APIXABAN 5 MG PO TABS
5.0000 mg | ORAL_TABLET | Freq: Two times a day (BID) | ORAL | 1 refills | Status: DC
Start: 2020-08-28 — End: 2020-09-01

## 2020-08-28 MED ORDER — NYSTATIN 100000 UNIT/GM EX POWD
Freq: Three times a day (TID) | CUTANEOUS | 0 refills | Status: DC
Start: 2020-08-28 — End: 2020-10-27

## 2020-08-28 NOTE — Care Management Important Message (Signed)
Important Message  Patient Details  Name: Courtney Anderson MRN: [id number] Date of Birth: Feb 03, 1956   Medicare Important Message Given:  Yes     Dannette Barbara 08/28/2020, 12:08 PM

## 2020-08-28 NOTE — TOC Progression Note (Signed)
Transition of Care [facility]) - Progression Note    Patient Details  Name: Courtney Anderson MRN: [id number] Date of Birth: 08/19/1956  Transition of Care [facility]) CM/SW Contact  Eileen Stanford, LCSW Phone Number: 08/28/2020, 12:16 PM  Clinical Narrative:   Per Mardene Celeste with [employer], hospital bed will be delivered today.         Expected Discharge Plan and Services                                                 Social Determinants of Health (SDOH) Interventions    Readmission Risk Interventions No flowsheet data found.

## 2020-08-28 NOTE — TOC Transition Note (Addendum)
Transition of Care [facility]) - CM/SW Discharge Note   Patient Details  Name: SHALLYN CONSTANCIO MRN: [id number] Date of Birth: [birth year]  Transition of Care [facility]) CM/SW Contact:  Eileen Stanford, LCSW Phone Number: 08/28/2020, 3:45 PM   Clinical Narrative:   Hospital bed and hoyar lift has been delivered to the home. Pt will be transferred via [facility]. Transport has been set up. RN aware.    Final next level of care: Home w Home Health Services Barriers to Discharge: No Barriers Identified   Patient Goals and CMS Choice        Discharge Placement                Patient to be transferred to facility by: [facility] Name of family member notified: son present at bedside Patient and family notified of of transfer: 08/28/20  Discharge Plan and Services                DME Arranged: Air overlay mattress, Oxygen, Hospital bed DME Agency: [employer] Date DME Agency Contacted: 08/28/20 Time DME Agency Contacted: [phone] Representative spoke with at DME Agency: zach, patricia HH Arranged: RN, PT, OT, Nurse's Aide, Speech Therapy [place] Agency: [place] ([place]) Date [place]: 08/28/20 Time [place]: [place] Representative spoke with at [place]: [place] ([place]) Interventions     Readmission Risk Interventions No flowsheet data found.

## 2020-08-28 NOTE — TOC Progression Note (Addendum)
Transition of Care [facility]) - Progression Note    Patient Details  Name: ZYARA RILING MRN: [id number] Date of Birth: 25-Jan-1956  Transition of Care [facility]) CM/SW Contact  Eileen Stanford, LCSW Phone Number: 08/28/2020, 1:53 PM  Clinical Narrative:   [facility] can no longer service pt. CSW spoke with pt's son and he is agreeable to [place]. CSW also informed pt's son that pure wick is not covered by insurance the son is inquiring about price--CSW is reaching out to DME company to determine price.   Mattress added to DME order. Per RN she doesn't believe pt can be transported by son. CSW will arrange transport once I have been notified that [employer] has delivered the bed. Pt's will be made aware by RN that he must buy a pure wick at DME store, [facility] or DME agencies do not sell these.CSW has checking in with [employer] every thirty minutes to determine if bed has been delivered not been delivered yet.          Expected Discharge Plan and Services                                                 Social Determinants of Health (SDOH) Interventions    Readmission Risk Interventions No flowsheet data found.

## 2020-08-28 NOTE — Plan of Care (Signed)

## 2020-08-28 NOTE — Discharge Summary (Signed)
Physician Discharge Summary  [name] QAS:[id number] DOB: 1956/07/21 DOA: 07/24/2020  PCP: Guadalupe Maple, MD  Admit date: 07/24/2020 Discharge date: 08/28/2020  Admitted From: Home Disposition:  Home  Recommendations for Outpatient Follow-up:  1. Follow up with PCP in 1-2 weeks 2. Follow-up with cardiology 3. Please obtain BMP/CBC in one week 4. Please follow up on the following pending results:None  Home Health: Yes Equipment/Devices: Hospital bed, bedside commode, wheelchair, home oxygen. Discharge Condition: Fair CODE STATUS: Full Diet recommendation: Heart Healthy / Carb Modified   Brief/Interim Summary: Courtney Anderson an 64 y.o.female who was admitted to [facility] on 2020-07-24 brought in via EMS as a "code sepsis". Patient subsequently tested positive for COVID-19. She presented with oxygen saturations of 69% on room air and had to be placed on nonrebreather mask to achieve oxygen saturations in the 90s. She was subsequently admitted to the Covid unit for management of COVID-19 pneumonia. She was noted to have acute kidney injury she was treated with remdesivir, zinc, vitamin C and antitussives. She was placed empirically on Rocephin and azithromycin. She continued to have increased FiO2 requirements. She was refusing to self prone. She had atrial fibrillation and was seen by cardiologyto assist in management.  07/31/20- patient is on 90% FiO2, will modify meds as patient is on metoprolol and levophed as well as cardizem. Will d/c propofol to improve SBP and remove arythmogenic vasopressor if able. Plan to have CVP trending and diuresis today. Goals of care discussion today - patient made DNR per sister and son of patient. I had 2 separate phone meetings with son and separate conversation with sister today to explain patient is worse with shock and renal failure requiring multiple pressors.   08/01/20- patient remains critically ill. FiO2 has been weaned down to 80%  today.  08/03/20- patient remains critically ill shes being proned, weaning FiO2 as able.  08/04/20-patient with no interval changes continue with weaning and proning protocol. Continue weaning FiO2 and PEEP as to optimize for SBT 08/06/20- patient is weaned down 40%FiO2 today, will attempt awakening trial with SBT, I spoke to son [name] today, he appreciates update and does not want Elink video view at this time.  08/07/20- patient is requiring 50%FiO2 today 08/08/20- Continuing to wean vent. FiO2 is 28%, will trial SBT today 08/09/20-tachypneic and hypertensive with SBT. Asynchronous.FiO2 back up to 50% 10/7 severeresp failure, attempt SAT/SBT 10/7 extubated to biPAP 10/8 Tachycardic , more confused,patient re-intubated ETT 8.0 10/9 remains on vent 10/11 severe resp failure on vent 10/12 failed weaning trial 10/12 successfully extubated 10/14 TRANSFER TO TRH 10/15 somnolent, sleepy , not interactive speech and swallow passed, but had aspiration event. At risk for aspiration .  10/16- mentation better today, down to 4L. Son at bedside 10/17- pt in foul mood, upset why she is here. Thinks she has been here for 160 days".  10/18- awake, calmer today, mentating well.  Refusing meds being crushed with applesauce or ice cream as speech had recommended 10/19-pt refusing to eat diet provided. Refused to take po meds crushed with apple sauce. Spoke to son today, told him about this. Also discussed her nutrition status, if she refuses she will need PEG placement. He wants to come by tonight after work to encourage her to take the food and if not, he will think about the PEG.  10/21-patient agreed to try food from home as she does not like hospital food.  She was asking for fried chicken and chips.  Encouraged  to try stool and smoothies before fried food.  Patient agrees to give it a try.  Remained stable since then.  Patient has severe deconditioning secondary to prolonged hospitalization and serious  illness.  Our PT and OT recommended SNF placement but patient and her family declined and would like to take her home with maximum home health services which were ordered.  We also ordered DME needs for her care at home.  Patient had multiple pressure injuries involving inguinal region and buttock area and needs to keep that area dry after removing Foley catheter.  Family can use pure wick which was ordered and understand that they might have to pay out-of-pocket.  Home dose of antihypertensives were held as patient was normotensive without it while in the hospital.  She needs a close follow-up by cardiology and her primary care provider for further recommendations and management.  She did developed A. fib with RVR and was started on amiodarone and Eliquis by cardiology and needs to continue on maintenance dose and a close follow-up with cardiology for further management.   Discharge Diagnoses:  Principal Problem:   Sepsis ([place]) Active Problems:   Hypertension   Morbid obesity ([place])   Hypercalcemia   Hyperthyroidism   Acute kidney failure, unspecified ([place])   Acute hypoxemic respiratory failure due to COVID-19 [facility])   Atrial fibrillation with RVR ([facility])   Hypokalemia   Skin ulcer ([place])   Acute respiratory failure with hypoxia ([facility])   Aspiration into airway   Discharge Instructions  Discharge Instructions    Diet - low sodium heart healthy   Complete by: As directed    Discharge instructions   Complete by: As directed    It was pleasure taking care of you. You are being given 2 new medications for your abnormal rhythm while you are in the hospital, continue taking as directed and follow-up with your cardiologist for further recommendations. Please follow-up with your primary care provider closely as we stopped your blood pressure medications as your blood pressure was normal while you were here, they can restart it if needed. Please keep your genital area clean and dry to help with  wound healing.  We also ordered pure wick which one of your family member can go and pick it up from DME store, if they are unable to apply it and nursing staff which will visit you at home can do it. Keep yourself well-hydrated and eat healthy to regain your strength.   Discharge wound care:   Complete by: As directed    Cleanse with tap water, pat gently dry. Apply silver hydrofiber (Aquacel Advantage, [place] # [phone]) to open wounds, then use house antimicrobial moisture wicking textile Estrella Deeds # [phone]) to all skin folds according to the instructions below: Measure and cut length of InterDry to fit in skin folds that have skin breakdown  Tuck InterDry fabric into skin folds in a single layer, allow for 2 inches of overhang from skin edges to allow for wicking to occur May remove to bathe; dry area thoroughly and then tuck into affected areas again  Do not apply any creams or ointments when using InterDry DO NOT THROW AWAY FOR 5 DAYS unless soiled with stool DO NOT [facility] product, this will inactivate the silver in the material  New sheet of Interdry should be applied after 5 days of use if patient continues to have skin breakdown  09   Increase activity slowly   Complete by: As directed  Allergies as of 08/28/2020      Reactions   Codeine       Medication List    STOP taking these medications   metoprolol succinate 50 MG 24 hr tablet Commonly known as: TOPROL-XL   Olmesartan-amLODIPine-HCTZ 40-10-25 MG Tabs     TAKE these medications   amiodarone 200 MG tablet Commonly known as: PACERONE Take 1 tablet (200 mg total) by mouth daily. Start taking on: August 29, 2020   apixaban 5 MG Tabs tablet Commonly known as: ELIQUIS Take 1 tablet (5 mg total) by mouth 2 (two) times daily.   aspirin 81 MG tablet Take 81 mg by mouth daily.   multivitamin tablet Take 1 tablet by mouth daily.   nystatin powder Commonly known as: MYCOSTATIN/NYSTOP Apply topically 3  (three) times daily.   Vitamin D 50 MCG (2000 UT) tablet Take by mouth.            Durable Medical Equipment  (From admission, onward)         Start     Ordered   08/28/20 1352  For home use only DME Air overlay mattress  Once        08/28/20 1351   08/27/20 1142  For home use only DME Walker rolling  Once       Question Answer Comment  Walker: With [place] Wheels   Patient needs a walker to treat with the following condition Generalized weakness      08/27/20 1142   08/27/20 1141  For home use only DME 3 n 1  Once        08/27/20 1142   08/27/20 1140  For home use only DME oxygen  Once       Question Answer Comment  Length of Need 12 Months   Mode or (Route) Nasal cannula   Liters per Minute 2   Frequency Continuous (stationary and portable oxygen unit needed)   Oxygen delivery system Gas      08/27/20 1142   08/27/20 1140  For home use only DME lightweight manual wheelchair with seat cushion  Once       Comments: Patient suffers from severe deconditioning secondary to prolonged hospitalization which impairs their ability to perform daily activities like bathing, dressing, feeding, grooming, and toileting in the home.  A cane will not resolve  issue with performing activities of daily living. A wheelchair will allow patient to safely perform daily activities. Patient is not able to propel themselves in the home using a standard weight wheelchair due to general weakness. Patient can self propel in the lightweight wheelchair. Length of need 12 months . Accessories: elevating leg rests (ELRs), wheel locks, extensions and anti-tippers.   08/27/20 1142   08/27/20 1139  For home use only DME Hospital bed  Once       Question Answer Comment  Length of Need 6 Months   The above medical condition requires: Patient requires the ability to reposition frequently   Bed type Semi-electric   Hoyer Lift Yes   Trapeze Bar Yes   Support Surface: Gel Overlay      08/27/20 1142            Discharge Care Instructions  (From admission, onward)         Start     Ordered   08/28/20 0000  Discharge wound care:       Comments: Cleanse with tap water, pat gently dry. Apply silver hydrofiber (Aquacel Advantage, Kellie Simmering #  [id number]) to open wounds, then use house antimicrobial moisture wicking textile Estrella Deeds # [id number]) to all skin folds according to the instructions below: Measure and cut length of InterDry to fit in skin folds that have skin breakdown  Tuck InterDry fabric into skin folds in a single layer, allow for 2 inches of overhang from skin edges to allow for wicking to occur May remove to bathe; dry area thoroughly and then tuck into affected areas again  Do not apply any creams or ointments when using InterDry DO NOT THROW AWAY FOR 5 DAYS unless soiled with stool DO NOT [facility] product, this will inactivate the silver in the material  New sheet of Interdry should be applied after 5 days of use if patient continues to have skin breakdown  09   08/28/20 1514          Follow-up Information    Corey Skains, MD. Schedule an appointment as soon as possible for a visit.   Specialty: Cardiology Contact information: [facility] [place] [address] [phone]        Guadalupe Maple, MD. Schedule an appointment as soon as possible for a visit in 1 week(s).   Specialty: Family Medicine Contact information: [place] [address] [phone]              Allergies  Allergen Reactions  . Codeine     Consultations:  PCCM  Cardiology  Procedures/Studies: DG Abd 1 View  Result Date: 08/11/2020 CLINICAL DATA:  OG tube placement EXAM: ABDOMEN - 1 VIEW COMPARISON:  Radiograph 08/01/2020 FINDINGS: Transesophageal tube tip and side port distal to the GE junction, terminating at the level of the gastric antrum/proximal duodenum bulb. Persistent heterogeneous airspace opacities present in the  lung bases likely with associated atelectasis and trace effusions. Multiple air-filled loops of small bowel and colon without frank dilatation or high-grade obstructive pattern. No suspicious calcifications. Likely edematous changes in the soft tissues. No acute osseous abnormality. IMPRESSION: 1. Transesophageal tube tip and side port distal to the GE junction, terminating at the level of the gastric antrum/proximal duodenum bulb. 2. Persistent bibasilar heterogeneous airspace opacities likely with atelectasis and trace effusions. Electronically Signed   By: Lovena Le M.D.   On: 08/11/2020 19:22   DG Abd 1 View  Result Date: 08/01/2020 CLINICAL DATA:  NG placement. EXAM: ABDOMEN - 1 VIEW COMPARISON:  Earlier today FINDINGS: Tip and side port of the enteric tube are now located below the diaphragm in the stomach. Similar bowel-gas pattern tear earlier today. IMPRESSION: Tip and side port of the enteric tube below the diaphragm in the stomach. Electronically Signed   By: Keith Rake M.D.   On: 08/01/2020 21:56   DG Abd 1 View  Result Date: 08/01/2020 CLINICAL DATA:  OG tube placement EXAM: ABDOMEN - 1 VIEW COMPARISON:  07/31/2020 FINDINGS: Esophageal tube tip in the region of GE junction, side-port the level of distal esophagus. Upper abdominal gas pattern is unobstructed. Airspace disease at both bases. IMPRESSION: Esophageal tube tip overlies the GE junction, side-port overlies distal esophagus, consider further advancement by at least 10 cm for more optimal positioning. These results will be called to the ordering clinician or representative by the Radiologist Assistant, and communication documented in the PACS or [employer]. Electronically Signed   By: Donavan Foil M.D.   On: 08/01/2020 20:22   DG Abd 1 View  Result Date: 07/31/2020 CLINICAL DATA:  Orogastric tube placement  EXAM: ABDOMEN - 1 VIEW COMPARISON:  July 30, 2020 FINDINGS: Orogastric tube tip is at the gastroesophageal  junction. No bowel dilatation or air-fluid level to suggest bowel obstruction. No free air. IMPRESSION: Orogastric tube tip at gastroesophageal junction. Advise advancing orogastric tube 8-10 cm. No bowel obstruction or free air evident. Electronically Signed   By: Lowella Grip III M.D.   On: 07/31/2020 12:15   DG Abd 1 View  Result Date: 07/30/2020 CLINICAL DATA:  Evaluate enteric tube. EXAM: ABDOMEN - 1 VIEW COMPARISON:  Earlier same day FINDINGS: Enteric tube tip and side port projects over the central upper abdomen. Nonobstructed bowel gas pattern. Osseous structures unremarkable. IMPRESSION: Interval advancement of the enteric tube with the tip and side port projecting over the central upper abdomen. Recommend advancing the tube an additional 5 cm. Electronically Signed   By: Lovey Newcomer M.D.   On: 07/30/2020 16:49   DG Abd 1 View  Result Date: 07/30/2020 CLINICAL DATA:  COVID positive patient.  OG tube evaluation EXAM: ABDOMEN - 1 VIEW COMPARISON:  None. FINDINGS: Enteric tube tip and side-port project within the distal esophagus, recommend advancement. Patchy airspace opacities lower lungs bilaterally. IMPRESSION: Enteric tube tip and side-port project within the distal esophagus, recommend advancement. These results will be called to the ordering clinician or representative by the Radiologist Assistant, and communication documented in the PACS or [employer]. Electronically Signed   By: Lovey Newcomer M.D.   On: 07/30/2020 13:11   DG Chest Port 1 View  Result Date: 08/18/2020 CLINICAL DATA:  Aspiration EXAM: PORTABLE CHEST 1 VIEW COMPARISON:  08/11/2020 FINDINGS: Endotracheal tube is no longer present. Unchanged position of left IJ central venous catheter. Bilateral airspace opacities are unchanged. Small pleural effusions. IMPRESSION: Unchanged bilateral airspace opacities. Electronically Signed   By: Ulyses Jarred M.D.   On: 08/18/2020 00:53   DG Chest Port 1 View  Result Date:  08/11/2020 CLINICAL DATA:  64 year old female with respiratory failure. EXAM: PORTABLE CHEST 1 VIEW COMPARISON:  Chest radiograph dated 08/20/2020. FINDINGS: Endotracheal tube with tip in the proximal right mainstem bronchus. Recommend retraction by approximately 4 cm. Left IJ central venous line in similar position. Bilateral pulmonary densities, right greater left, appears similar to prior radiograph. Small bilateral pleural effusions may be present. No pneumothorax. Stable cardiac silhouette. No acute osseous pathology. IMPRESSION: 1. Endotracheal tube with tip in the proximal right mainstem bronchus. Recommend retraction by approximately 4 cm. 2. No significant interval change in bilateral pulmonary opacities. These results were called by telephone at the time of interpretation on 08/11/2020 at 3:39 pm to provider Adalberto Ill, who verbally acknowledged these results. Electronically Signed   By: Anner Crete M.D.   On: 08/11/2020 15:50   DG Chest Port 1 View  Result Date: 08/10/2020 CLINICAL DATA:  Acute respiratory failure. EXAM: PORTABLE CHEST 1 VIEW COMPARISON:  08/07/2020.  07/31/2020. FINDINGS: Endotracheal tube, NG tube, left IJ line in stable position. Stable cardiomegaly. Multifocal bilateral pulmonary infiltrates are again noted. Small bilateral pleural effusions. No pneumothorax. IMPRESSION: 1. Lines and tubes in stable position. 2. Stable cardiomegaly. 3. Multifocal bilateral pulmonary infiltrates again noted. No interim change. Electronically Signed   By: Marcello Moores  Register   On: 08/10/2020 05:13   DG Chest Port 1 View  Result Date: 08/07/2020 CLINICAL DATA:  COVID-19 pneumonia. EXAM: PORTABLE CHEST 1 VIEW COMPARISON:  Abdominal x-ray dated August 01, 2020. Chest x-ray dated July 31, 2020. FINDINGS: Unchanged endotracheal and enteric tubes. Unchanged left internal jugular central  venous catheter. Stable cardiomediastinal silhouette. Poor inspiratory effort compared to the prior study.  Peripheral and basilar predominant patchy airspace disease in the right greater than left lungs is not significantly changed. No pneumothorax or pleural effusion. No acute osseous abnormality. IMPRESSION: 1. Unchanged multifocal pneumonia. Electronically Signed   By: Titus Dubin M.D.   On: 08/07/2020 10:00   DG Chest Port 1 View  Result Date: 07/31/2020 CLINICAL DATA:  Orogastric tube placement EXAM: PORTABLE CHEST 1 VIEW COMPARISON:  July 30, 2020. FINDINGS: Endotracheal tube tip is 6.0 cm above carina. Orogastric tube tip is at the gastroesophageal junction. Left jugular catheter tip is in the superior vena cava. No pneumothorax. Patchy airspace opacity is present bilaterally, primarily in the periphery of the upper and lower lung regions, essentially stable. Heart is upper normal in size with pulmonary vascularity normal. No adenopathy. No bone lesions. IMPRESSION: Tube and catheter positions as described without pneumothorax. Note that the orogastric tube tip is at the gastroesophageal junction. Advise advancing orogastric tube 8-10 cm. Patchy airspace opacity bilaterally, stable. Stable cardiac silhouette. Electronically Signed   By: Lowella Grip III M.D.   On: 07/31/2020 12:14   DG Chest Port 1 View  Result Date: 07/30/2020 CLINICAL DATA:  Worsening COVID pneumonia.  Intubation. EXAM: PORTABLE CHEST 1 VIEW COMPARISON:  07/29/2020 FINDINGS: The endotracheal tube is 4.3 cm above the carina. The left IJ catheter tip is in the distal SVC near the cavoatrial junction. The NG tube is coursing down the esophagus and into the stomach. The tip is in the fundal region and the proximal port is near the GE junction. This could be advanced several cm. Persistent diffuse interstitial and airspace process in the lungs but slight improved aeration after intubation. No pneumothorax. IMPRESSION: 1. Persistent infiltrates but slight improved aeration after intubation. 2. ET tube and left IJ catheter in good  position. 3. NG tube tip in the fundal region and proximal port is near the GE junction. This could be advanced several cm. Electronically Signed   By: Marijo Sanes M.D.   On: 07/30/2020 13:11     Subjective: Patient has no new complaint today.  Asking when she can go home.  Stating that she had a really good day yesterday as she was able to eat chicken strips.  Discharge Exam: Vitals:   08/28/20 0947 08/28/20 1202  BP:  128/83  Pulse:  74  Resp:  17  Temp:  97.6 F (36.4 C)  SpO2: 96% 94%   Vitals:   08/28/20 0410 08/28/20 0811 08/28/20 0947 08/28/20 1202  BP:  (!) 141/79  128/83  Pulse:  63  74  Resp:  16  17  Temp:  97.9 F (36.6 C)  97.6 F (36.4 C)  TempSrc:    Oral  SpO2:  95% 96% 94%  Weight: 99 kg     Height:        General: Pt is alert, awake, not in acute distress Cardiovascular: RRR, S1/S2 +, no rubs, no gallops Respiratory: CTA bilaterally, no wheezing, no rhonchi Abdominal: Soft, NT, ND, bowel sounds + Extremities: no edema, no cyanosis   The results of significant diagnostics from this hospitalization (including imaging, microbiology, ancillary and laboratory) are listed below for reference.    Microbiology: No results found for this or any previous visit (from the past 240 hour(s)).   Labs: BNP (last 3 results) Recent Labs    07/24/20 0914  BNP 528.4*   Basic Metabolic Panel: Recent Labs  Lab  08/23/20 0415 08/23/20 0415 08/24/20 0540 08/25/20 0550 08/26/20 0420 08/27/20 0312 08/28/20 0549  NA 136  --  137 136 137 137  --   K 3.2*   < > 3.3* 3.4* 3.4* 3.4* 4.0  CL 98  --  101 103 105 107  --   CO2 29  --  24 25 23 24   --   GLUCOSE 113*  --  110* 103* 120* 102*  --   BUN 8  --  7* 7* 6* 8  --   CREATININE 0.98  --  0.99 1.08* 1.02* 1.19*  --   CALCIUM 9.9  --  10.4* 10.3 10.4* 9.9  --   MG 2.0   < > 1.9 1.9 1.8 2.4 2.1  PHOS 2.3*   < > 2.4* 2.2* 2.7 3.2 2.9   < > = values in this interval not displayed.   Liver Function  Tests: Recent Labs  Lab 08/25/20 0550  AST 23  ALT 18  ALKPHOS 66  BILITOT 0.8  PROT 7.2  ALBUMIN 3.1*   No results for input(s): LIPASE, AMYLASE in the last 168 hours. No results for input(s): AMMONIA in the last 168 hours. CBC: Recent Labs  Lab 08/22/20 0444 08/24/20 0540 08/27/20 0312  WBC 8.2 6.6 6.3  HGB 12.1 12.6 11.7*  HCT 38.2 39.6 35.9*  MCV 86.0 86.1 85.1  PLT 150 148* 214   Cardiac Enzymes: No results for input(s): CKTOTAL, CKMB, CKMBINDEX, TROPONINI in the last 168 hours. BNP: Invalid input(s): POCBNP CBG: Recent Labs  Lab 08/27/20 2110 08/27/20 2322 08/28/20 0409 08/28/20 0814 08/28/20 1212  GLUCAP 88 92 77 82 89   D-Dimer No results for input(s): DDIMER in the last 72 hours. Hgb A1c No results for input(s): HGBA1C in the last 72 hours. Lipid Profile No results for input(s): CHOL, HDL, LDLCALC, TRIG, CHOLHDL, LDLDIRECT in the last 72 hours. Thyroid function studies No results for input(s): TSH, T4TOTAL, T3FREE, THYROIDAB in the last 72 hours.  Invalid input(s): FREET3 Anemia work up No results for input(s): VITAMINB12, FOLATE, FERRITIN, TIBC, IRON, RETICCTPCT in the last 72 hours. Urinalysis    Component Value Date/Time   COLORURINE YELLOW (A) 07/24/2020 0912   APPEARANCEUR CLOUDY (A) 07/24/2020 0912   APPEARANCEUR Hazy (A) 05/13/2018 1048   LABSPEC 1.015 07/24/2020 0912   PHURINE 5.0 07/24/2020 0912   GLUCOSEU NEGATIVE 07/24/2020 0912   HGBUR LARGE (A) 07/24/2020 0912   BILIRUBINUR NEGATIVE 07/24/2020 0912   BILIRUBINUR Negative 05/13/2018 1048   KETONESUR NEGATIVE 07/24/2020 0912   PROTEINUR 30 (A) 07/24/2020 0912   NITRITE NEGATIVE 07/24/2020 0912   LEUKOCYTESUR LARGE (A) 07/24/2020 0912   Sepsis Labs Invalid input(s): PROCALCITONIN,  WBC,  LACTICIDVEN Microbiology No results found for this or any previous visit (from the past 240 hour(s)).  Time coordinating discharge: Over 30 minutes  SIGNED:  Lorella Nimrod, MD  [facility] 08/28/2020, 3:16 PM  If 7PM-7AM, please contact night-coverage [email or web address]  This record has been created using Systems analyst. Errors have been sought and corrected,but may not always be located. Such creation errors do not reflect on the standard of care.

## 2020-08-28 NOTE — Progress Notes (Signed)
Patient Name: Courtney Anderson Date of Encounter: 08/28/2020  Hospital Problem List     Principal Problem:   Sepsis [facility]) Active Problems:   Hypertension   Morbid obesity ([place])   Hypercalcemia   Hyperthyroidism   AKI (acute kidney injury) ([place])   COVID-19   Atrial fibrillation with RVR ([place])   Hypokalemia   Skin ulcer ([place])    Patient Profile        64 yo with history of paroxysmal afib, covid 19 sepsis/pneumonia .Marland Kitchen On amiodarone now able to take oral medication management  and having no current evidence of significant cardiovascular symptoms and/or myocardial infarction or congestive heart failure     Subjective   Patient slowly improving but still short of breath with any physical activity and weak and fatigued which this may be secondary to concerns of her recent hospitalization and infection rather than atrial fibrillation..  Patient rested well overnight with no further evidence of significant recurrent atrial fibrillation or other significant cardiovascular symptoms.  Patient is restless for possible discharged home overall has several days of stability from the cardiac standpoint without evidence of recurrent atrial fibrillation.  Appears to be tolerating anticoagulation without apparent side effects  Inpatient Medications    . amiodarone  200 mg Oral Daily  . apixaban  5 mg Oral BID  . aspirin  81 mg Oral Daily  . cholecalciferol  2,000 Units Oral Daily  . collagenase   Topical Daily  . insulin aspart  0-9 Units Subcutaneous Q4H  . mouth rinse  15 mL Mouth Rinse q12n4p  . multivitamin with minerals  1 tablet Oral Daily  . multivitamin-lutein  1 capsule Oral Daily  . nystatin   Topical TID  . pneumococcal 23 valent vaccine  0.5 mL Intramuscular Tomorrow-1000    Vital Signs    Vitals:   08/28/20 0410 08/28/20 0811 08/28/20 0947 08/28/20 1202  BP:  (!) 141/79  128/83  Pulse:  63  74  Resp:  16  17  Temp:  97.9 F (36.6 C)  97.6 F (36.4 C)  TempSrc:     Oral  SpO2:  95% 96% 94%  Weight: 99 kg     Height:        Intake/Output Summary (Last 24 hours) at 08/28/2020 1249 Last data filed at 08/27/2020 2008 Gross per 24 hour  Intake 739.98 ml  Output 0 ml  Net 739.98 ml   Filed Weights   08/24/20 0414 08/25/20 0402 08/28/20 0410  Weight: 97.3 kg 97.5 kg 99 kg    Physical Exam    GEN: Well nourished, well developed, in no acute distress.  HEENT: normal.  Neck: Supple, no JVD, carotid bruits, or masses. Cardiac: RRR, no murmurs, rubs, or gallops. No clubbing, cyanosis, edema.  Radials/DP/PT 2+ and equal bilaterally.  Respiratory:  Respirations regular and unlabored, few crackles and wheezes bilaterally GI: Soft, nontender, nondistended, BS + x 4. MS: no deformity or atrophy. Skin: warm and dry, no rash. Neuro:  Strength and sensation are intact. Psych: Normal affect.  Labs    CBC Recent Labs    08/27/20 0312  WBC 6.3  HGB 11.7*  HCT 35.9*  MCV 85.1  PLT 275   Basic Metabolic Panel Recent Labs    08/26/20 0420 08/26/20 0420 08/27/20 0312 08/28/20 0549  NA 137  --  137  --   K 3.4*   < > 3.4* 4.0  CL 105  --  107  --   CO2 23  --  24  --   GLUCOSE 120*  --  102*  --   BUN 6*  --  8  --   CREATININE 1.02*  --  1.19*  --   CALCIUM 10.4*  --  9.9  --   MG 1.8   < > 2.4 2.1  PHOS 2.7   < > 3.2 2.9   < > = values in this interval not displayed.   Liver Function Tests No results for input(s): AST, ALT, ALKPHOS, BILITOT, PROT, ALBUMIN in the last 72 hours. No results for input(s): LIPASE, AMYLASE in the last 72 hours. Cardiac Enzymes No results for input(s): CKTOTAL, CKMB, CKMBINDEX, TROPONINI in the last 72 hours. BNP No results for input(s): BNP in the last 72 hours. D-Dimer No results for input(s): DDIMER in the last 72 hours. Hemoglobin A1C No results for input(s): HGBA1C in the last 72 hours. Fasting Lipid Panel No results for input(s): CHOL, HDL, LDLCALC, TRIG, CHOLHDL, LDLDIRECT in the last 72  hours. Thyroid Function Tests No results for input(s): TSH, T4TOTAL, T3FREE, THYROIDAB in the last 72 hours.  Invalid input(s): FREET3  Telemetry    Normal sinus rhythm  ECG    No ischemia.  Normal sinus rhythm  Radiology    DG Abd 1 View  Result Date: 08/11/2020 CLINICAL DATA:  OG tube placement EXAM: ABDOMEN - 1 VIEW COMPARISON:  Radiograph 08/01/2020 FINDINGS: Transesophageal tube tip and side port distal to the GE junction, terminating at the level of the gastric antrum/proximal duodenum bulb. Persistent heterogeneous airspace opacities present in the lung bases likely with associated atelectasis and trace effusions. Multiple air-filled loops of small bowel and colon without frank dilatation or high-grade obstructive pattern. No suspicious calcifications. Likely edematous changes in the soft tissues. No acute osseous abnormality. IMPRESSION: 1. Transesophageal tube tip and side port distal to the GE junction, terminating at the level of the gastric antrum/proximal duodenum bulb. 2. Persistent bibasilar heterogeneous airspace opacities likely with atelectasis and trace effusions. Electronically Signed   By: Lovena Le M.D.   On: 08/11/2020 19:22   DG Abd 1 View  Result Date: 08/01/2020 CLINICAL DATA:  NG placement. EXAM: ABDOMEN - 1 VIEW COMPARISON:  Earlier today FINDINGS: Tip and side port of the enteric tube are now located below the diaphragm in the stomach. Similar bowel-gas pattern tear earlier today. IMPRESSION: Tip and side port of the enteric tube below the diaphragm in the stomach. Electronically Signed   By: Keith Rake M.D.   On: 08/01/2020 21:56   DG Abd 1 View  Result Date: 08/01/2020 CLINICAL DATA:  OG tube placement EXAM: ABDOMEN - 1 VIEW COMPARISON:  07/31/2020 FINDINGS: Esophageal tube tip in the region of GE junction, side-port the level of distal esophagus. Upper abdominal gas pattern is unobstructed. Airspace disease at both bases. IMPRESSION: Esophageal tube  tip overlies the GE junction, side-port overlies distal esophagus, consider further advancement by at least 10 cm for more optimal positioning. These results will be called to the ordering clinician or representative by the Radiologist Assistant, and communication documented in the PACS or [employer]. Electronically Signed   By: Donavan Foil M.D.   On: 08/01/2020 20:22   DG Abd 1 View  Result Date: 07/31/2020 CLINICAL DATA:  Orogastric tube placement EXAM: ABDOMEN - 1 VIEW COMPARISON:  July 30, 2020 FINDINGS: Orogastric tube tip is at the gastroesophageal junction. No bowel dilatation or air-fluid level to suggest bowel obstruction. No free air. IMPRESSION: Orogastric tube tip at gastroesophageal  junction. Advise advancing orogastric tube 8-10 cm. No bowel obstruction or free air evident. Electronically Signed   By: Lowella Grip III M.D.   On: 07/31/2020 12:15   DG Abd 1 View  Result Date: 07/30/2020 CLINICAL DATA:  Evaluate enteric tube. EXAM: ABDOMEN - 1 VIEW COMPARISON:  Earlier same day FINDINGS: Enteric tube tip and side port projects over the central upper abdomen. Nonobstructed bowel gas pattern. Osseous structures unremarkable. IMPRESSION: Interval advancement of the enteric tube with the tip and side port projecting over the central upper abdomen. Recommend advancing the tube an additional 5 cm. Electronically Signed   By: Lovey Newcomer M.D.   On: 07/30/2020 16:49   DG Abd 1 View  Result Date: 07/30/2020 CLINICAL DATA:  COVID positive patient.  OG tube evaluation EXAM: ABDOMEN - 1 VIEW COMPARISON:  None. FINDINGS: Enteric tube tip and side-port project within the distal esophagus, recommend advancement. Patchy airspace opacities lower lungs bilaterally. IMPRESSION: Enteric tube tip and side-port project within the distal esophagus, recommend advancement. These results will be called to the ordering clinician or representative by the Radiologist Assistant, and communication  documented in the PACS or [employer]. Electronically Signed   By: Lovey Newcomer M.D.   On: 07/30/2020 13:11   DG Chest Port 1 View  Result Date: 08/18/2020 CLINICAL DATA:  Aspiration EXAM: PORTABLE CHEST 1 VIEW COMPARISON:  08/11/2020 FINDINGS: Endotracheal tube is no longer present. Unchanged position of left IJ central venous catheter. Bilateral airspace opacities are unchanged. Small pleural effusions. IMPRESSION: Unchanged bilateral airspace opacities. Electronically Signed   By: Ulyses Jarred M.D.   On: 08/18/2020 00:53   DG Chest Port 1 View  Result Date: 08/11/2020 CLINICAL DATA:  64 year old female with respiratory failure. EXAM: PORTABLE CHEST 1 VIEW COMPARISON:  Chest radiograph dated 08/20/2020. FINDINGS: Endotracheal tube with tip in the proximal right mainstem bronchus. Recommend retraction by approximately 4 cm. Left IJ central venous line in similar position. Bilateral pulmonary densities, right greater left, appears similar to prior radiograph. Small bilateral pleural effusions may be present. No pneumothorax. Stable cardiac silhouette. No acute osseous pathology. IMPRESSION: 1. Endotracheal tube with tip in the proximal right mainstem bronchus. Recommend retraction by approximately 4 cm. 2. No significant interval change in bilateral pulmonary opacities. These results were called by telephone at the time of interpretation on 08/11/2020 at 3:39 pm to provider Adalberto Ill, who verbally acknowledged these results. Electronically Signed   By: Anner Crete M.D.   On: 08/11/2020 15:50   DG Chest Port 1 View  Result Date: 08/10/2020 CLINICAL DATA:  Acute respiratory failure. EXAM: PORTABLE CHEST 1 VIEW COMPARISON:  08/07/2020.  07/31/2020. FINDINGS: Endotracheal tube, NG tube, left IJ line in stable position. Stable cardiomegaly. Multifocal bilateral pulmonary infiltrates are again noted. Small bilateral pleural effusions. No pneumothorax. IMPRESSION: 1. Lines and tubes in stable  position. 2. Stable cardiomegaly. 3. Multifocal bilateral pulmonary infiltrates again noted. No interim change. Electronically Signed   By: Marcello Moores  Register   On: 08/10/2020 05:13   DG Chest Port 1 View  Result Date: 08/07/2020 CLINICAL DATA:  COVID-19 pneumonia. EXAM: PORTABLE CHEST 1 VIEW COMPARISON:  Abdominal x-ray dated August 01, 2020. Chest x-ray dated July 31, 2020. FINDINGS: Unchanged endotracheal and enteric tubes. Unchanged left internal jugular central venous catheter. Stable cardiomediastinal silhouette. Poor inspiratory effort compared to the prior study. Peripheral and basilar predominant patchy airspace disease in the right greater than left lungs is not significantly changed. No pneumothorax or pleural effusion. No acute  osseous abnormality. IMPRESSION: 1. Unchanged multifocal pneumonia. Electronically Signed   By: Titus Dubin M.D.   On: 08/07/2020 10:00   DG Chest Port 1 View  Result Date: 07/31/2020 CLINICAL DATA:  Orogastric tube placement EXAM: PORTABLE CHEST 1 VIEW COMPARISON:  July 30, 2020. FINDINGS: Endotracheal tube tip is 6.0 cm above carina. Orogastric tube tip is at the gastroesophageal junction. Left jugular catheter tip is in the superior vena cava. No pneumothorax. Patchy airspace opacity is present bilaterally, primarily in the periphery of the upper and lower lung regions, essentially stable. Heart is upper normal in size with pulmonary vascularity normal. No adenopathy. No bone lesions. IMPRESSION: Tube and catheter positions as described without pneumothorax. Note that the orogastric tube tip is at the gastroesophageal junction. Advise advancing orogastric tube 8-10 cm. Patchy airspace opacity bilaterally, stable. Stable cardiac silhouette. Electronically Signed   By: Lowella Grip III M.D.   On: 07/31/2020 12:14   DG Chest Port 1 View  Result Date: 07/30/2020 CLINICAL DATA:  Worsening COVID pneumonia.  Intubation. EXAM: PORTABLE CHEST 1 VIEW  COMPARISON:  07/29/2020 FINDINGS: The endotracheal tube is 4.3 cm above the carina. The left IJ catheter tip is in the distal SVC near the cavoatrial junction. The NG tube is coursing down the esophagus and into the stomach. The tip is in the fundal region and the proximal port is near the GE junction. This could be advanced several cm. Persistent diffuse interstitial and airspace process in the lungs but slight improved aeration after intubation. No pneumothorax. IMPRESSION: 1. Persistent infiltrates but slight improved aeration after intubation. 2. ET tube and left IJ catheter in good position. 3. NG tube tip in the fundal region and proximal port is near the GE junction. This could be advanced several cm. Electronically Signed   By: Marijo Sanes M.D.   On: 07/30/2020 13:11    Assessment & Plan    Principal Problem:   Sepsis ([place]) Active Problems:   Hypertension   Morbid obesity ([place])   Hypercalcemia   Hyperthyroidism   AKI (acute kidney injury) ([place])   COVID-19 virus infection   Atrial fibrillation with RVR ([facility])   Hypokalemia     1.  Paroxysmal atrial fibrillation with rapid ventricular rate, exacerbated by probable aspiration pneumonia and respiratory failure requiring intubation. She has been on amiodarone drip,   IV digoxin and heparin drip due to aspiration previously.  No obvious aspiration on SLP bedside evaluation.     She converted to sinus rhythm at a rate of 90 bpm.now able to take oral amiodarone at 200 mg daily for continued maintenance of normal sinus rhythm.  Also has converted to intravenous heparin to Eliquis 5 mg twice per day for further risk reduction stroke.  Metoprolol will be considered orally if necessary for heart rate control if patient has episode of atrial fibrillation No further cardiac diagnostics necessary at this time due to improvements of symptoms and maintenance of normal sinus rhythm And okay for discharged home from cardiac standpoint or to a facility to  help manage above 2.  COVID-19 pneumonia/sepsis, extubated, followed by vomiting and probable aspiration pneumonia with recurre nt respiratory failure requiring intubation, now extubated and appears to be doing well.      3.  Begin ambulation and rehabilitation from above issues.  If patient is continuing to do well with current medical regimen including amiodarone and Eliquis at current dose would not change at this time and okay for discharge home from cardiac standpoint with  follow-up in 1 to 2 weeks  The patient has been interviewed and examined. I agree with assessment and plan above. Serafina Royals MD [facility]

## 2020-08-28 NOTE — Progress Notes (Signed)
[facility] for Electrolyte Monitoring and Replacement   Recent Labs: Potassium (mmol/L)  Date Value  08/27/2020 3.4 (L)   Magnesium (mg/dL)  Date Value  08/28/2020 2.1   Calcium (mg/dL)  Date Value  08/27/2020 9.9   Calcium, Total (PTH) (mg/dL)  Date Value  07/24/2020 10.1   Albumin (g/dL)  Date Value  08/25/2020 3.1 (L)  10/22/2019 4.2   Phosphorus (mg/dL)  Date Value  08/28/2020 2.9   Sodium (mmol/L)  Date Value  08/27/2020 137  10/22/2019 139   Assessment: 64 year old female with PMHx of HTN, HLD, primary hyperparathyroidism who was admitted with COVID-19 PNA, also with A-fib with RVR and severe intertrigo. Patient extubated 10/12. Patient has been weaned to room air.   Patient was NPO secondary to aspiration events. Patient seen by SLP a couple of days ago. Cleared patient to take medications crushed in puree. Dysphagia 1 diet has been ordered. Patient has converted to NSR. Patient previously refusing to take PO medications but agreed to take them 10/21. Now on oral amiodarone. Heart rate well controlled at this time.   On D5-1/2 NS @ 100 mL/hr   Goal of Therapy:  Potassium 4.0 - 5.1 mmol/L Magnesium 2.0 - 2.4 mg/dL All Other Electrolytes WNL  Plan:   No new potassium. Add on potassium level ordered.   Will follow up with electrolytes with AM labs.   Oswald Hillock, PharmD, BCPS 08/28/2020 8:04 AM

## 2020-08-31 ENCOUNTER — Telehealth: Payer: Self-pay

## 2020-08-31 NOTE — Telephone Encounter (Signed)
Copied from [place] [id number]. Topic: General - Other >> Aug 31, 2020  9:59 AM Leward Quan A wrote: Reason for CRM: Patient called in to inquire if [facility] if she will be keeping her on the BP medication. Patient just got out of the hospital and will be doing therapy to get herself back to functioning per patient. Asking for a call to know if there will be any thing that she need to be doing. Patient was informed that she need an appointment but say she will wait for the call from the office Ph# [phone]

## 2020-08-31 NOTE — Telephone Encounter (Signed)
She should be taking and not taking medications as noted on discharge summary from hospital, copied it here.  She needs follow-up with me in office: STOP taking these medications       metoprolol succinate 50 MG 24 hr tablet Commonly known as: TOPROL-XL    Olmesartan-amLODIPine-HCTZ 40-10-25 MG Tabs               TAKE these medications       amiodarone 200 MG tablet Commonly known as: PACERONE Take 1 tablet (200 mg total) by mouth daily. Start taking on: August 29, 2020    apixaban 5 MG Tabs tablet Commonly known as: ELIQUIS Take 1 tablet (5 mg total) by mouth 2 (two) times daily.    aspirin 81 MG tablet Take 81 mg by mouth daily.    multivitamin tablet Take 1 tablet by mouth daily.    nystatin powder Commonly known as: MYCOSTATIN/NYSTOP Apply topically 3 (three) times daily.    Vitamin D 50 MCG (2000 UT) tablet Take by mouth.

## 2020-08-31 NOTE — Telephone Encounter (Signed)
Routing to provider to advise.  

## 2020-08-31 NOTE — Telephone Encounter (Signed)
Tried calling patient. No answer and no VM. Will try to call again later.  ?

## 2020-09-01 ENCOUNTER — Telehealth: Payer: Self-pay | Admitting: *Deleted

## 2020-09-01 ENCOUNTER — Ambulatory Visit: Payer: Self-pay | Admitting: Pharmacist

## 2020-09-01 ENCOUNTER — Other Ambulatory Visit: Payer: Self-pay | Admitting: Nurse Practitioner

## 2020-09-01 ENCOUNTER — Telehealth: Payer: Self-pay

## 2020-09-01 ENCOUNTER — Telehealth: Payer: Self-pay | Admitting: Family Medicine

## 2020-09-01 DIAGNOSIS — I4891 Unspecified atrial fibrillation: Secondary | ICD-10-CM

## 2020-09-01 MED ORDER — RIVAROXABAN 20 MG PO TABS: 20.0000 mg | ORAL_TABLET | Freq: Every day | ORAL | 5 refills | Status: DC

## 2020-09-01 NOTE — Telephone Encounter (Signed)
Got her on phone this evening, she sees me virtually on 3rd and then scheduled to see you 5th (will need to be virtual she reports).  I sent in [place], but do not think it will have great coverage either.  Any help you can provide will be great, as she will need assistance for anti-coag and her new a-fib.

## 2020-09-01 NOTE — Telephone Encounter (Signed)
Copied from [place] [id number]. Topic: General - Inquiry >> Sep 01, 2020  1:27 PM Gillis Ends D wrote: Reason for CRM: Patient called because when she went to go pick up the Eliquis from the pharmacy it was 500.00 and she don't have that type of money. She would like for you to change the medication to something else. She can be reached at [phone]. Please advise

## 2020-09-01 NOTE — Progress Notes (Signed)
Working with Xarelto and Eliquis reps to find an affordable solution.

## 2020-09-01 NOTE — Telephone Encounter (Signed)
Verbal orders provided and called, left HIPAA compliant message.

## 2020-09-01 NOTE — Telephone Encounter (Signed)
Tried calling patient. No answer and no VM set up. Will try to call again later.  

## 2020-09-01 NOTE — Telephone Encounter (Signed)
Routing to provider for orders.  °

## 2020-09-01 NOTE — Telephone Encounter (Signed)
Carlota Raspberry, RN with home health agency is calling for an approval of patient's plan of care.  Caller states she must return to patient's home today and that her visit frequency request is as follows:  2wk x1, 3wk x2, 2wk x1, 1wk x5   Please call  Amy with verbal approval as soon as possible at [phone].

## 2020-09-01 NOTE — Telephone Encounter (Addendum)
Can you assist with this Almyra Free, I have only met once, but appears Eliquis is too costly. Can you reach out to her and I will put in urgent referral.  Thanks.

## 2020-09-04 ENCOUNTER — Telehealth: Payer: Self-pay

## 2020-09-04 NOTE — Chronic Care Management (AMB) (Signed)
  Chronic Care Management   Outreach Note  09/04/2020 Name: Courtney Anderson MRN: [id number] DOB: 04-Dec-1955  Courtney Anderson is a 64 y.o. year old female who is a primary care patient of Crissman, Jeannette How, MD. I reached out to Courtney Anderson by phone today in response to a referral sent by Ms. Leone Payor Padmore's PCP, Golden Pop MD.      An unsuccessful telephone outreach was attempted today. The patient was referred to the case management team for assistance with care management and care coordination.   Follow Up Plan: The care management team will reach out to the patient again over the next 7 days.  If patient returns call to provider office, please advise to call [place] at [phone].  [place] Management

## 2020-09-04 NOTE — Telephone Encounter (Signed)
Copied from [place] [phone]. 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 If you have any question Ms. Faith said please give her a call.

## 2020-09-04 NOTE — Telephone Encounter (Signed)
OK for verbal orders?

## 2020-09-05 ENCOUNTER — Other Ambulatory Visit: Payer: Self-pay | Admitting: Nurse Practitioner

## 2020-09-05 ENCOUNTER — Telehealth: Payer: Self-pay

## 2020-09-05 MED ORDER — RIVAROXABAN 20 MG PO TABS
20.0000 mg | ORAL_TABLET | Freq: Every day | ORAL | 5 refills | Status: DC
Start: 2020-09-05 — End: 2020-10-02

## 2020-09-05 NOTE — Telephone Encounter (Signed)
Yes please. Just let her know the Xarelto prescription is now at [facility] (which matches the free voucher). Poor thing. I know this is confusing to her.

## 2020-09-05 NOTE — Chronic Care Management (AMB) (Signed)
  Chronic Care Management   Outreach Note  09/05/2020 Name: [name] MRN: [id number] DOB: November 01, 1956  Courtney Anderson is a 64 y.o. year old female who is a primary care patient of Crissman, Jeannette How, MD. I reached out to Courtney Anderson by phone today in response to a referral sent by Ms. Leone Payor Mast[place]'s PCP, Guadalupe Maple, MD.      A telephone outreach was attempted today patient was not available to talk. The patient was referred to the case management team for assistance with care management and care coordination.   Follow Up Plan: The care management team will reach out to the patient again over the next 7 days.  If patient returns call to provider office, please advise to call [place] at [phone].  [place] Management

## 2020-09-05 NOTE — Telephone Encounter (Signed)
Copied from [place] [phone]. Topic: General - Other >> Sep 05, 2020 12:41 PM Courtney Anderson wrote: Reason for CRM: Pt stated her pharmacy informed her that the discount card that she was given does not match the medication therefore there was no discount. Pt stated she can not afford to pay $500 for a prescription. Pt requests call back

## 2020-09-05 NOTE — Telephone Encounter (Signed)
Called Faith gave verbal order. She verbalized understanding.   KP

## 2020-09-05 NOTE — Telephone Encounter (Signed)
Completed and sent.

## 2020-09-05 NOTE — Chronic Care Management (AMB) (Signed)
  Chronic Care Management   Note  09/05/2020 Name: Courtney Anderson MRN: [id number] DOB: [birth year]  Reason for referral:  Received urgent referral that patient had been discharged from hospital on Eliquis and could not afford it due to not having a prescrption drug plan.  Referral source:  Marnee Guarneri, NP Referral medication(s): Eliquis, Xarelto Current insurance:No prescription coverage  PMHx: HTN, Afib RVR, ARF due to COVID 19, Hyperthyroidism, HLD  HPI: Patient admitted to [facility] 07/24/20, tested positive for COVID and pneumonia. Developed  AFIB with RVR started on Eliquis and amiodarone per cardiology. Discharged 10/25 with prescription for Eliquis and reported it would be $500.    Objective: Allergies  Allergen Reactions  . Codeine     Medications Reviewed Today    Reviewed by Marcello Moores, CPhT (Pharmacy Technician) on 07/24/20 at [facility] List Status: Complete  Medication Order Taking? Sig Documenting Provider Last Dose Status Informant  aspirin 81 MG tablet [id number] Yes Take 81 mg by mouth daily. [provider] 07/23/2020 Unknown time Active Pharmacy Records  Cholecalciferol (VITAMIN D) 50 MCG (2000 UT) tablet [id number] Yes Take by mouth. [provider] 07/23/2020 Unknown time Active Pharmacy Records  metoprolol succinate (TOPROL-XL) 50 MG 24 hr tablet [id number] Yes Take 1 tablet (50 mg total) by mouth daily. Marnee Guarneri T, NP 07/23/2020 Unknown time Active Pharmacy Records  Multiple Vitamin (MULTIVITAMIN) tablet [id number] Yes Take 1 tablet by mouth daily. [provider] 07/23/2020 Unknown time Active Pharmacy Records       Patient not taking:      Discontinued 07/24/20 1110 (Completed Course)   Olmesartan-amLODIPine-HCTZ 40-10-25 MG TABS [id number] Yes Take 1 tablet by mouth daily. Need appointment for further refills. Marnee Guarneri T, NP 07/23/2020 Unknown time Active Pharmacy Records          Assessment: Patient with new Afib RVR unable  to afford Eliquis. Currently without prescription insurance  Patient Assistance Programs: 1) Eliquis  o Income requirement met: [x] Yes [] No [] Unknown o Out-of-pocket prescription expenditure met:    [] Yes [x] No  [] Unknown  [] Not applicable No Eliquis 30 day voucher coupons available at office. Spoke with drug rep and asked for additional supply for the practice. She will bring in next week. R  Patient does not meet OOP expenditure requirements for future PAP and unable to obtainwill ask PCP to change to Xarelto.       2)  Xarelto  o Income requirement met: [x] Yes [] No  [] Unknown o Out-of-pocket prescription expenditure met:   [] Yes [] No   [] Unknown [x] Not applicable - No free trial 30 day copay cards. Spoke with Xarelto drug rep, Joelene Millin, who brought in vouchers. Have asked CPA to initiate PAP process. Patient notified that voucher is up front for Xarelto.  Additional medication assistance options reviewed with patient as warranted:  Patient is planning to enroll in a part D plan for next year.  Goals Addressed   None      Plan: Patient is to provide additional documents as necessary.   Follow up:  Patient has been put on my CCM schedule for full initial visit 09/08/20.  [name]. Kenton Kingfisher PharmD, [facility] [phone]

## 2020-09-05 NOTE — Telephone Encounter (Signed)
Called pt advised that rx has been sent to [facility] pt says she appreciates it.

## 2020-09-05 NOTE — Telephone Encounter (Signed)
Patient states she now uses [facility] in [place]. I have asked Jolene to send Xarelto RX to [place] and added to preferred pharmacy list. (Patient previously used [facility])

## 2020-09-06 ENCOUNTER — Telehealth: Payer: Self-pay

## 2020-09-06 ENCOUNTER — Telehealth (INDEPENDENT_AMBULATORY_CARE_PROVIDER_SITE_OTHER): Payer: Medicare Other | Admitting: Nurse Practitioner

## 2020-09-06 ENCOUNTER — Encounter: Payer: Self-pay | Admitting: Nurse Practitioner

## 2020-09-06 ENCOUNTER — Telehealth: Payer: Self-pay | Admitting: Pharmacist

## 2020-09-06 VITALS — BP 146/90 | HR 67 | Temp 99.2°F | Resp 18

## 2020-09-06 DIAGNOSIS — N179 Acute kidney failure, unspecified: Secondary | ICD-10-CM

## 2020-09-06 DIAGNOSIS — I10 Essential (primary) hypertension: Secondary | ICD-10-CM

## 2020-09-06 DIAGNOSIS — S31000A Unspecified open wound of lower back and pelvis without penetration into retroperitoneum, initial encounter: Secondary | ICD-10-CM

## 2020-09-06 DIAGNOSIS — I4891 Unspecified atrial fibrillation: Secondary | ICD-10-CM | POA: Diagnosis not present

## 2020-09-06 DIAGNOSIS — J9601 Acute respiratory failure with hypoxia: Secondary | ICD-10-CM

## 2020-09-06 DIAGNOSIS — U071 COVID-19: Secondary | ICD-10-CM

## 2020-09-06 MED ORDER — OLMESARTAN MEDOXOMIL 20 MG PO TABS: 20.0000 mg | ORAL_TABLET | Freq: Every day | ORAL | 4 refills | Status: DC

## 2020-09-06 NOTE — Assessment & Plan Note (Signed)
Acute and improving.  She reports no SOB or cough, no fever.  Unable to perform face to face exam today.  Will plan on home health RN obtaining CBC and BMP on Friday.  Return to office in 4 weeks for follow-up.

## 2020-09-06 NOTE — Assessment & Plan Note (Signed)
Chronic, ongoing with elevations since discharge home.  Will restart Olmesartan at 20 MG daily, will offer kidney protection.  Recommend checking BP at home daily and documenting for provider + focus on DASH diet.  BMP and CBC on Friday to be obtained by home health RN. Return to office in 4 weeks.

## 2020-09-06 NOTE — Progress Notes (Addendum)
BP (!) 146/90   Pulse 67   Temp 99.2 F (37.3 C) (Oral)   Resp 18   SpO2 94%    Subjective:    Patient ID: Courtney Anderson, female    DOB: [birth year], 64 y.o.   MRN: [id number]  HPI: Courtney Anderson is a 64 y.o. female  Chief Complaint  Patient presents with  . Hospitalization Follow-up    Covid and double pneumonia, Hospital started her on Eliquis, does not know how she will pay for this, not curently taking  . Wound care referral    sacral area, and has samll open ares on stomach, home health nurse would like an order to apply zeroform  M-W-F and cover with bandage  . Hypertension    on d/c was not place on medication    . This visit was completed via telephone due to the restrictions of the COVID-19 pandemic. All issues as above were discussed and addressed but no physical exam was performed. If it was felt that the patient should be evaluated in the office, they were directed there. The patient verbally consented to this visit. Patient was unable to complete an audio/visual visit due to Lack of equipment. Due to the catastrophic nature of the COVID-19 pandemic, this visit was done through audio contact only. . Location of the patient: home . Location of the provider: work . Those involved with this call:  . Provider: Marnee Guarneri, DNP . CMA: Yvonna Alanis, CMA . Front Desk/Registration: Jill Side  . Time spent on call: 30 minutes on the phone discussing health concerns. 15 minutes total spent in review of patient's record and preparation of their chart.  . I verified patient identity using two factors (patient name and date of birth). Patient consents verbally to being seen via telemedicine visit today.    Transition of Care Hospital Follow up.  Home health RN present with patient.  Patient reports overall feeling better.  Home Health RN would like referral to wound care clinic due to sacral wound of concern with whitish eschar present.  Stomach wounds she reports  are stable.  She is treating both at this time.  BP has been elevated -- Metoprolol, Olmesartan-Amlodipine-HCTZ were discontinued in hospital.  She has deconditioning lower extremities and is working with PT.  "Brief/Interim Summary: Courtney Anderson an 64 y.o.female who was admitted to [facility] on 2020-07-24 brought in via EMS as a "code sepsis". Patient subsequently tested positive for COVID-19. She presented with oxygen saturations of 69% on room air and had to be placed on nonrebreather mask to achieve oxygen saturations in the 90s. She was subsequently admitted to the Covid unit for management of COVID-19 pneumonia. She was noted to have acute kidney injury she was treated with remdesivir, zinc, vitamin C and antitussives. She was placed empirically on Rocephin and azithromycin. She continued to have increased FiO2 requirements. She was refusing to self prone. She had atrial fibrillation and was seen by cardiologyto assist in management.  07/31/20- patient is on 90% FiO2, will modify meds as patient is on metoprolol and levophed as well as cardizem. Will d/c propofol to improve SBP and remove arythmogenic vasopressor if able. Plan to have CVP trending and diuresis today. Goals of care discussion today - patient made DNR per sister and son of patient. I had 2 separate phone meetings with son and separate conversation with sister today to explain patient is worse with shock and renal failure requiring multiple pressors.   08/01/20- patient  remains critically ill. FiO2 has been weaned down to 80% today.  08/03/20- patient remains critically ill shes being proned, weaning FiO2 as able.  08/04/20-patient with no interval changes continue with weaning and proning protocol. Continue weaning FiO2 and PEEP as to optimize for SBT 08/06/20- patient is weaned down 40%FiO2 today, will attempt awakening trial with SBT, I spoke to son [name] today, he appreciates update and does not want Elink video view  at this time.  08/07/20- patient is requiring 50%FiO2 today 08/08/20- Continuing to wean vent. FiO2 is 28%, will trial SBT today 08/09/20-tachypneic and hypertensive with SBT. Asynchronous.FiO2 back up to 50% 10/7 severeresp failure, attempt SAT/SBT 10/7 extubated to biPAP 10/8 Tachycardic , more confused,patient re-intubated ETT 8.0 10/9 remains on vent 10/11 severe resp failure on vent 10/12 failed weaning trial 10/12 successfully extubated 10/14 TRANSFER TO TRH 10/15 somnolent, sleepy , not interactive speech and swallow passed, but had aspiration event. At risk for aspiration .  10/16-mentation better today, down to 4L. Son at bedside 10/17-pt in foul mood, upset why she is here. Thinks she has been here for 160 days".  10/18-awake, calmer today, mentating well. Refusing meds being crushed with applesauce or ice cream as speech had recommended 10/19-pt refusing to eat diet provided. Refused to take po meds crushed with apple sauce. Spoke to son today, told him about this. Also discussed her nutrition status, if she refuses she will need PEG placement. He wants to come by tonight after work to encourage her to take the food and if not, he will think about the PEG.  10/21-patient agreed to try food from home as she does not like hospital food. She was asking for fried chicken and chips. Encouraged to try stool and smoothies before fried food. Patient agrees to give it a try. Remained stable since then.  Patient has severe deconditioning secondary to prolonged hospitalization and serious illness.  Our PT and OT recommended SNF placement but patient and her family declined and would like to take her home with maximum home health services which were ordered.  We also ordered DME needs for her care at home.  Patient had multiple pressure injuries involving inguinal region and buttock area and needs to keep that area dry after removing Foley catheter.  Family can use pure wick which was  ordered and understand that they might have to pay out-of-pocket.  Home dose of antihypertensives were held as patient was normotensive without it while in the hospital.  She needs a close follow-up by cardiology and her primary care provider for further recommendations and management.  She did developed A. fib with RVR and was started on amiodarone and Eliquis by cardiology and needs to continue on maintenance dose and a close follow-up with cardiology for further management."   Hospital/Facility: [facility] D/C Physician: Dr. Reesa Chew D/C Date: 08/28/20  Records Requested: 09/06/20 Records Received: 09/06/20 Records Reviewed: 09/06/20  Diagnoses on Discharge:  Principal Problem:   Sepsis ([place]) Active Problems:   Hypertension   Morbid obesity ([place])   Hypercalcemia   Hyperthyroidism   Acute kidney failure, unspecified ([place])   Acute hypoxemic respiratory failure due to COVID-19 [facility])   Atrial fibrillation with RVR ([facility])   Hypokalemia   Skin ulcer ([place])   Acute respiratory failure with hypoxia ([facility])   Aspiration into airway  Date of interactive Contact within 48 hours of discharge:  Contact was through: phone  Date of 7 day or 14 day face-to-face visit:    within 53  days  Outpatient Encounter Medications as of 09/06/2020  Medication Sig  . amiodarone (PACERONE) 200 MG tablet Take 1 tablet (200 mg total) by mouth daily.  Marland Kitchen apixaban (ELIQUIS) 5 MG TABS tablet Take 5 mg by mouth 2 (two) times daily.  Marland Kitchen aspirin 81 MG tablet Take 81 mg by mouth daily.  . Cholecalciferol (VITAMIN D) 50 MCG (2000 UT) tablet Take by mouth.  . Multiple Vitamin (MULTIVITAMIN) tablet Take 1 tablet by mouth daily.  Marland Kitchen nystatin (MYCOSTATIN/NYSTOP) powder Apply topically 3 (three) times daily.  . rivaroxaban (XARELTO) 20 MG TABS tablet Take 1 tablet (20 mg total) by mouth daily with supper.  . olmesartan (BENICAR) 20 MG tablet Take 1 tablet (20 mg total) by mouth daily.   No facility-administered encounter  medications on file as of 09/06/2020.    Diagnostic Tests Reviewed/Disposition: recent labs noted below  EXAM: PORTABLE CHEST 1 VIEW  COMPARISON:  08/11/2020  FINDINGS: Endotracheal tube is no longer present. Unchanged position of left IJ central venous catheter. Bilateral airspace opacities are unchanged. Small pleural effusions.  IMPRESSION: Unchanged bilateral airspace opacities.   Electronically Signed   By: Ulyses Jarred M.D.   On: 08/18/2020 00:53  Consults: cardiology and wound care  Discharge Instructions: 1. Follow up with PCP in 1-2 weeks 2. Follow-up with cardiology 3. Please obtain BMP/CBC in one week  Disease/illness Education: Reviewed with patient and home health nurse  Home Health/Community Services Discussions/Referrals: [place] and nursing and PT  Establishment or re-establishment of referral orders for community resources: none  Discussion with other health care providers: Reviewed notes  Assessment and Support of treatment regimen adherence: Reviewed with patient  Appointments Coordinated with: Reviewed with patient  Education for self-management, independent living, and ADLs: Reviewed with patient  Relevant past medical, surgical, family and social history reviewed and updated as indicated. Interim medical history since our last visit reviewed. Allergies and medications reviewed and updated.  Review of Systems  Constitutional: Negative for activity change, appetite change, diaphoresis, fatigue and fever.  Respiratory: Negative for cough, chest tightness and shortness of breath.   Cardiovascular: Negative for chest pain, palpitations and leg swelling.  Gastrointestinal: Negative for abdominal distention, abdominal pain, constipation, diarrhea, nausea and vomiting.  Endocrine: Negative for cold intolerance, heat intolerance, polydipsia, polyphagia and polyuria.  Skin: Positive for wound.  Neurological: Negative for dizziness,  syncope, weakness, light-headedness, numbness and headaches.  Psychiatric/Behavioral: Negative.     Per HPI unless specifically indicated above     Objective:    BP (!) 146/90   Pulse 67   Temp 99.2 F (37.3 C) (Oral)   Resp 18   SpO2 94%   Wt Readings from Last 3 Encounters:  08/28/20 218 lb 3.2 oz (99 kg)  10/22/19 242 lb (109.8 kg)  01/11/19 200 lb (90.7 kg)    Physical Exam   Unable to perform due to telephone visit only  Results for orders placed or performed during the hospital encounter of 07/24/20  Blood culture (routine single)   Specimen: BLOOD LEFT ARM  Result Value Ref Range   Specimen Description BLOOD LEFT ARM    Special Requests      BOTTLES DRAWN AEROBIC AND ANAEROBIC Blood Culture adequate volume   Culture      NO GROWTH 5 DAYS Performed at [facility], [address] [place], [place] [address]    Report Status 07/29/2020 FINAL   Urine culture   Specimen: Urine, Random  Result Value Ref Range  Specimen Description      URINE, RANDOM Performed at [facility], [address] [place], [place] [address]    Special Requests      NONE Performed at [facility], [address], [place], [place] [address]    Culture (A)     <10,000 COLONIES/mL >=100,000 COLONIES/mL Performed at [facility] [address], [place], [place] [address]    Report Status 07/25/2020 FINAL   SARS Coronavirus 2 by RT PCR (hospital order, performed in [facility] hospital lab) Nasopharyngeal Nasopharyngeal Swab   Specimen: Nasopharyngeal Swab  Result Value Ref Range   SARS Coronavirus 2 POSITIVE (A) NEGATIVE  MRSA PCR Screening   Specimen: Nasopharyngeal  Result Value Ref Range   MRSA by PCR NEGATIVE NEGATIVE  Culture, respiratory (non-expectorated)   Specimen: Tracheal Aspirate; Respiratory  Result Value Ref Range   Specimen Description      TRACHEAL ASPIRATE Performed at [facility], [address]  [place], [place] [address]    Special Requests      NONE Performed at [facility], [place], [place] [address]    Gram Stain NO WBC SEEN NO ORGANISMS SEEN     Culture      RARE Normal respiratory flora-no Staph aureus or Pseudomonas seen Performed at [facility] [address], [place], [place] [address]    Report Status 08/03/2020 FINAL   Lactic acid, plasma  Result Value Ref Range   Lactic Acid, Venous 3.9 (HH) 0.5 - 1.9 mmol/L  Lactic acid, plasma  Result Value Ref Range   Lactic Acid, Venous 2.5 (HH) 0.5 - 1.9 mmol/L  Comprehensive metabolic panel  Result Value Ref Range   Sodium 132 (L) 135 - 145 mmol/L   Potassium 3.0 (L) 3.5 - 5.1 mmol/L   Chloride 94 (L) 98 - 111 mmol/L   CO2 20 (L) 22 - 32 mmol/L   Glucose, Bld 152 (H) 70 - 99 mg/dL   BUN 74 (H) 8 - 23 mg/dL   Creatinine, Ser 2.81 (H) 0.44 - 1.00 mg/dL   Calcium 10.4 (H) 8.9 - 10.3 mg/dL   Total Protein 8.1 6.5 - 8.1 g/dL   Albumin 2.7 (L) 3.5 - 5.0 g/dL   AST 60 (H) 15 - 41 U/L   ALT 54 (H) 0 - 44 U/L   Alkaline Phosphatase 70 38 - 126 U/L   Total Bilirubin 1.1 0.3 - 1.2 mg/dL   GFR calc non Af Amer 17 (L) >60 mL/min   GFR calc Af Amer 20 (L) >60 mL/min   Anion gap 18 (H) 5 - 15  CBC WITH DIFFERENTIAL  Result Value Ref Range   WBC 15.0 (H) 4.0 - 10.5 K/uL   RBC 5.73 (H) 3.87 - 5.11 MIL/uL   Hemoglobin 15.9 (H) 12.0 - 15.0 g/dL   HCT 47.5 (H) 36 - 46 %   MCV 82.9 80.0 - 100.0 fL   MCH 27.7 26.0 - 34.0 pg   MCHC 33.5 30.0 - 36.0 g/dL   RDW 14.6 11.5 - 15.5 %   Platelets 436 (H) 150 - 400 K/uL   nRBC 0.0 0.0 - 0.2 %   Neutrophils Relative % 86 %   Neutro Abs 13.1 (H) 1.7 - 7.7 K/uL   Lymphocytes Relative 8 %   Lymphs Abs 1.1 0.7 - 4.0 K/uL   Monocytes Relative 3 %   Monocytes Absolute 0.5 0.1 - 1.0 K/uL   Eosinophils Relative 0 %  Eosinophils Absolute 0.0 0.0 - 0.5 K/uL   Basophils Relative 1 %   Basophils Absolute 0.1 0.0 - 0.1 K/uL   WBC Morphology MORPHOLOGY  UNREMARKABLE    RBC Morphology MORPHOLOGY UNREMARKABLE    Smear Review Normal platelet morphology    Immature Granulocytes 2 %   Abs Immature Granulocytes 0.26 (H) 0.00 - 0.07 K/uL  Protime-INR  Result Value Ref Range   Prothrombin Time 15.4 (H) 11.4 - 15.2 seconds   INR 1.3 (H) 0.8 - 1.2  APTT  Result Value Ref Range   aPTT <24 (L) 24 - 36 seconds  Urinalysis, Complete w Microscopic  Result Value Ref Range   Color, Urine YELLOW (A) YELLOW   APPearance CLOUDY (A) CLEAR   Specific Gravity, Urine 1.015 1.005 - 1.030   pH 5.0 5.0 - 8.0   Glucose, UA NEGATIVE NEGATIVE mg/dL   Hgb urine dipstick LARGE (A) NEGATIVE   Bilirubin Urine NEGATIVE NEGATIVE   Ketones, ur NEGATIVE NEGATIVE mg/dL   Protein, ur 30 (A) NEGATIVE mg/dL   Nitrite NEGATIVE NEGATIVE   Leukocytes,Ua LARGE (A) NEGATIVE   RBC / HPF 21-50 0 - 5 RBC/hpf   WBC, UA >50 (H) 0 - 5 WBC/hpf   Bacteria, UA RARE (A) NONE SEEN   Squamous Epithelial / LPF 0-5 0 - 5  Brain natriuretic peptide  Result Value Ref Range   B Natriuretic Peptide 111.0 (H) 0.0 - 100.0 pg/mL  HIV Antibody (routine testing w rflx)  Result Value Ref Range   HIV Screen 4th Generation wRfx Non Reactive Non Reactive  Thyroid Panel With TSH  Result Value Ref Range   TSH 2.850 0.450 - 4.500 uIU/mL   T4, Total 5.3 4.5 - 12.0 ug/dL   T3 Uptake Ratio 42 (H) 24 - 39 %   Free Thyroxine Index 2.2 1.2 - 4.9  PTH, intact and calcium  Result Value Ref Range   PTH 118 (H) 15 - 65 pg/mL   Calcium, Total (PTH) 10.1 8.7 - 10.3 mg/dL   PTH Interp Comment   Procalcitonin - Baseline  Result Value Ref Range   Procalcitonin 1.35 ng/mL  Protime-INR  Result Value Ref Range   Prothrombin Time 16.4 (H) 11.4 - 15.2 seconds   INR 1.4 (H) 0.8 - 1.2  Cortisol-am, blood  Result Value Ref Range   Cortisol - AM 14.0 6.7 - 22.6 ug/dL  Procalcitonin  Result Value Ref Range   Procalcitonin 1.39 ng/mL  CBC with Differential/Platelet  Result Value Ref Range   WBC 12.7 (H)  4.0 - 10.5 K/uL   RBC 5.54 (H) 3.87 - 5.11 MIL/uL   Hemoglobin 15.4 (H) 12.0 - 15.0 g/dL   HCT 45.3 36 - 46 %   MCV 81.8 80.0 - 100.0 fL   MCH 27.8 26.0 - 34.0 pg   MCHC 34.0 30.0 - 36.0 g/dL   RDW 14.6 11.5 - 15.5 %   Platelets 405 (H) 150 - 400 K/uL   nRBC 0.0 0.0 - 0.2 %   Neutrophils Relative % 89 %   Neutro Abs 11.2 (H) 1.7 - 7.7 K/uL   Lymphocytes Relative 7 %   Lymphs Abs 0.8 0.7 - 4.0 K/uL   Monocytes Relative 3 %   Monocytes Absolute 0.4 0.1 - 1.0 K/uL   Eosinophils Relative 0 %   Eosinophils Absolute 0.0 0.0 - 0.5 K/uL   Basophils Relative 0 %   Basophils Absolute 0.1 0.0 - 0.1 K/uL   Immature Granulocytes 1 %  Abs Immature Granulocytes 0.17 (H) 0.00 - 0.07 K/uL  Comprehensive metabolic panel  Result Value Ref Range   Sodium 140 135 - 145 mmol/L   Potassium 3.5 3.5 - 5.1 mmol/L   Chloride 108 98 - 111 mmol/L   CO2 23 22 - 32 mmol/L   Glucose, Bld 141 (H) 70 - 99 mg/dL   BUN 98 (H) 8 - 23 mg/dL   Creatinine, Ser 1.36 (H) 0.44 - 1.00 mg/dL   Calcium 10.2 8.9 - 10.3 mg/dL   Total Protein 7.0 6.5 - 8.1 g/dL   Albumin 2.3 (L) 3.5 - 5.0 g/dL   AST 48 (H) 15 - 41 U/L   ALT 45 (H) 0 - 44 U/L   Alkaline Phosphatase 63 38 - 126 U/L   Total Bilirubin 0.7 0.3 - 1.2 mg/dL   GFR calc non Af Amer 41 (L) >60 mL/min   GFR calc Af Amer 47 (L) >60 mL/min   Anion gap 9 5 - 15  C-reactive protein  Result Value Ref Range   CRP 20.4 (H) <1.0 mg/dL  Fibrin derivatives D-Dimer ([facility] only)  Result Value Ref Range   Fibrin derivatives D-dimer ([facility]) 4,002.20 (H) 0.00 - 499.00 ng/mL (FEU)  Ferritin  Result Value Ref Range   Ferritin 594 (H) 11 - 307 ng/mL  Magnesium  Result Value Ref Range   Magnesium 2.4 1.7 - 2.4 mg/dL  Phosphorus  Result Value Ref Range   Phosphorus 3.3 2.5 - 4.6 mg/dL  Fibrin derivatives D-Dimer ([facility] only)  Result Value Ref Range   Fibrin derivatives D-dimer ([facility]) 3,909.31 (H) 0.00 - 499.00 ng/mL (FEU)  Protein Electro, Random Urine  Result Value Ref  Range   Total Protein, Urine 30.8 Not Estab. mg/dL   Albumin ELP, Urine 23.8 %   Alpha-1-Globulin, U 2.8 %   Alpha-2-Globulin, U 8.0 %   Beta Globulin, U 41.9 %   Gamma Globulin, U 23.5 %   M Component, Ur Not Observed Not Observed %   Please Note: Comment   Protein electrophoresis, serum  Result Value Ref Range   Total Protein ELP 6.0 6.0 - 8.5 g/dL   Albumin ELP 2.1 (L) 2.9 - 4.4 g/dL   Alpha-1-Globulin 0.4 0.0 - 0.4 g/dL   Alpha-2-Globulin 1.0 0.4 - 1.0 g/dL   Beta Globulin 0.9 0.7 - 1.3 g/dL   Gamma Globulin 1.6 0.4 - 1.8 g/dL   M-Spike, % 0.7 (H) Not Observed g/dL   SPE Interp. Comment    Comment Comment    Globulin, Total 3.9 2.2 - 3.9 g/dL   A/G Ratio 0.5 (L) 0.7 - 1.7  Kappa/lambda light chains  Result Value Ref Range   Kappa free light chain 49.0 (H) 3.3 - 19.4 mg/L   Lamda free light chains 64.6 (H) 5.7 - 26.3 mg/L   Kappa, lamda light chain ratio 0.76 0.26 - 1.65  PTH-related peptide  Result Value Ref Range   PTH-related peptide <2.0 pmol/L  Parathyroid hormone, intact (no Ca)  Result Value Ref Range   PTH 102 (H) 15 - 65 pg/mL  25-Hydroxy vitamin D Lcms D2+D3  Result Value Ref Range   25-Hydroxy, Vitamin D 14 (L) ng/mL   25-Hydroxy, Vitamin D-2 <1.0 ng/mL   25-Hydroxy, Vitamin D-3 14 ng/mL  Lactic acid, plasma  Result Value Ref Range   Lactic Acid, Venous 2.6 (HH) 0.5 - 1.9 mmol/L  Heparin level (unfractionated)  Result Value Ref Range   Heparin Unfractionated 0.11 (L) 0.30 - 0.70 IU/mL  CBC with Differential/Platelet  Result Value Ref Range   WBC 12.7 (H) 4.0 - 10.5 K/uL   RBC 5.31 (H) 3.87 - 5.11 MIL/uL   Hemoglobin 14.9 12.0 - 15.0 g/dL   HCT 43.3 36 - 46 %   MCV 81.5 80.0 - 100.0 fL   MCH 28.1 26.0 - 34.0 pg   MCHC 34.4 30.0 - 36.0 g/dL   RDW 14.5 11.5 - 15.5 %   Platelets 418 (H) 150 - 400 K/uL   nRBC 0.0 0.0 - 0.2 %   Neutrophils Relative % 84 %   Neutro Abs 10.9 (H) 1.7 - 7.7 K/uL   Lymphocytes Relative 7 %   Lymphs Abs 0.8 0.7 - 4.0 K/uL     Monocytes Relative 5 %   Monocytes Absolute 0.6 0.1 - 1.0 K/uL   Eosinophils Relative 0 %   Eosinophils Absolute 0.0 0.0 - 0.5 K/uL   Basophils Relative 1 %   Basophils Absolute 0.1 0.0 - 0.1 K/uL   Immature Granulocytes 3 %   Abs Immature Granulocytes 0.37 (H) 0.00 - 0.07 K/uL  Comprehensive metabolic panel  Result Value Ref Range   Sodium 141 135 - 145 mmol/L   Potassium 3.0 (L) 3.5 - 5.1 mmol/L   Chloride 109 98 - 111 mmol/L   CO2 24 22 - 32 mmol/L   Glucose, Bld 163 (H) 70 - 99 mg/dL   BUN 59 (H) 8 - 23 mg/dL   Creatinine, Ser 0.96 0.44 - 1.00 mg/dL   Calcium 10.1 8.9 - 10.3 mg/dL   Total Protein 6.7 6.5 - 8.1 g/dL   Albumin 2.4 (L) 3.5 - 5.0 g/dL   AST 35 15 - 41 U/L   ALT 39 0 - 44 U/L   Alkaline Phosphatase 60 38 - 126 U/L   Total Bilirubin 0.6 0.3 - 1.2 mg/dL   GFR calc non Af Amer >60 >60 mL/min   GFR calc Af Amer >60 >60 mL/min   Anion gap 8 5 - 15  C-reactive protein  Result Value Ref Range   CRP 12.7 (H) <1.0 mg/dL  Fibrin derivatives D-Dimer ([facility] only)  Result Value Ref Range   Fibrin derivatives D-dimer ([facility]) 2,882.03 (H) 0.00 - 499.00 ng/mL (FEU)  Ferritin  Result Value Ref Range   Ferritin 627 (H) 11 - 307 ng/mL  Magnesium  Result Value Ref Range   Magnesium 2.1 1.7 - 2.4 mg/dL  Phosphorus  Result Value Ref Range   Phosphorus 1.8 (L) 2.5 - 4.6 mg/dL  Heparin level (unfractionated)  Result Value Ref Range   Heparin Unfractionated 0.47 0.30 - 0.70 IU/mL  Heparin level (unfractionated)  Result Value Ref Range   Heparin Unfractionated 0.64 0.30 - 0.70 IU/mL  CBC with Differential/Platelet  Result Value Ref Range   WBC 12.8 (H) 4.0 - 10.5 K/uL   RBC 5.24 (H) 3.87 - 5.11 MIL/uL   Hemoglobin 14.8 12.0 - 15.0 g/dL   HCT 44.5 36 - 46 %   MCV 84.9 80.0 - 100.0 fL   MCH 28.2 26.0 - 34.0 pg   MCHC 33.3 30.0 - 36.0 g/dL   RDW 14.9 11.5 - 15.5 %   Platelets 460 (H) 150 - 400 K/uL   nRBC 0.2 0.0 - 0.2 %   Neutrophils Relative % 86 %   Neutro Abs 10.9  (H) 1.7 - 7.7 K/uL   Lymphocytes Relative 6 %   Lymphs Abs 0.8 0.7 - 4.0 K/uL   Monocytes Relative 4 %   Monocytes  Absolute 0.5 0.1 - 1.0 K/uL   Eosinophils Relative 0 %   Eosinophils Absolute 0.0 0.0 - 0.5 K/uL   Basophils Relative 0 %   Basophils Absolute 0.1 0.0 - 0.1 K/uL   Immature Granulocytes 4 %   Abs Immature Granulocytes 0.54 (H) 0.00 - 0.07 K/uL  Comprehensive metabolic panel  Result Value Ref Range   Sodium 138 135 - 145 mmol/L   Potassium 3.1 (L) 3.5 - 5.1 mmol/L   Chloride 105 98 - 111 mmol/L   CO2 22 22 - 32 mmol/L   Glucose, Bld 180 (H) 70 - 99 mg/dL   BUN 29 (H) 8 - 23 mg/dL   Creatinine, Ser 0.84 0.44 - 1.00 mg/dL   Calcium 9.9 8.9 - 10.3 mg/dL   Total Protein 7.0 6.5 - 8.1 g/dL   Albumin 2.5 (L) 3.5 - 5.0 g/dL   AST 29 15 - 41 U/L   ALT 35 0 - 44 U/L   Alkaline Phosphatase 62 38 - 126 U/L   Total Bilirubin 0.5 0.3 - 1.2 mg/dL   GFR calc non Af Amer >60 >60 mL/min   GFR calc Af Amer >60 >60 mL/min   Anion gap 11 5 - 15  C-reactive protein  Result Value Ref Range   CRP 5.5 (H) <1.0 mg/dL  Fibrin derivatives D-Dimer ([facility] only)  Result Value Ref Range   Fibrin derivatives D-dimer ([facility]) 1,858.60 (H) 0.00 - 499.00 ng/mL (FEU)  Ferritin  Result Value Ref Range   Ferritin 518 (H) 11 - 307 ng/mL  Magnesium  Result Value Ref Range   Magnesium 1.8 1.7 - 2.4 mg/dL  Phosphorus  Result Value Ref Range   Phosphorus 1.9 (L) 2.5 - 4.6 mg/dL  Heparin level (unfractionated)  Result Value Ref Range   Heparin Unfractionated 0.51 0.30 - 0.70 IU/mL  CBC with Differential/Platelet  Result Value Ref Range   WBC 12.4 (H) 4.0 - 10.5 K/uL   RBC 5.21 (H) 3.87 - 5.11 MIL/uL   Hemoglobin 14.9 12.0 - 15.0 g/dL   HCT 44.3 36 - 46 %   MCV 85.0 80.0 - 100.0 fL   MCH 28.6 26.0 - 34.0 pg   MCHC 33.6 30.0 - 36.0 g/dL   RDW 15.2 11.5 - 15.5 %   Platelets 436 (H) 150 - 400 K/uL   nRBC 0.3 (H) 0.0 - 0.2 %   Neutrophils Relative % 83 %   Neutro Abs 10.3 (H) 1.7 - 7.7 K/uL    Lymphocytes Relative 8 %   Lymphs Abs 1.0 0.7 - 4.0 K/uL   Monocytes Relative 5 %   Monocytes Absolute 0.6 0.1 - 1.0 K/uL   Eosinophils Relative 0 %   Eosinophils Absolute 0.0 0.0 - 0.5 K/uL   Basophils Relative 0 %   Basophils Absolute 0.1 0.0 - 0.1 K/uL   Immature Granulocytes 4 %   Abs Immature Granulocytes 0.46 (H) 0.00 - 0.07 K/uL  Comprehensive metabolic panel  Result Value Ref Range   Sodium 138 135 - 145 mmol/L   Potassium 3.7 3.5 - 5.1 mmol/L   Chloride 104 98 - 111 mmol/L   CO2 23 22 - 32 mmol/L   Glucose, Bld 183 (H) 70 - 99 mg/dL   BUN 18 8 - 23 mg/dL   Creatinine, Ser 0.84 0.44 - 1.00 mg/dL   Calcium 9.8 8.9 - 10.3 mg/dL   Total Protein 6.9 6.5 - 8.1 g/dL   Albumin 2.5 (L) 3.5 - 5.0 g/dL   AST 27  15 - 41 U/L   ALT 31 0 - 44 U/L   Alkaline Phosphatase 59 38 - 126 U/L   Total Bilirubin 0.4 0.3 - 1.2 mg/dL   GFR calc non Af Amer >60 >60 mL/min   GFR calc Af Amer >60 >60 mL/min   Anion gap 11 5 - 15  C-reactive protein  Result Value Ref Range   CRP 3.8 (H) <1.0 mg/dL  Fibrin derivatives D-Dimer ([facility] only)  Result Value Ref Range   Fibrin derivatives D-dimer ([facility]) 1,162.70 (H) 0.00 - 499.00 ng/mL (FEU)  Ferritin  Result Value Ref Range   Ferritin 479 (H) 11 - 307 ng/mL  Magnesium  Result Value Ref Range   Magnesium 2.1 1.7 - 2.4 mg/dL  Phosphorus  Result Value Ref Range   Phosphorus 2.0 (L) 2.5 - 4.6 mg/dL  Heparin level (unfractionated)  Result Value Ref Range   Heparin Unfractionated 0.82 (H) 0.30 - 0.70 IU/mL  Procalcitonin - Baseline  Result Value Ref Range   Procalcitonin 0.10 ng/mL  Heparin level (unfractionated)  Result Value Ref Range   Heparin Unfractionated 0.84 (H) 0.30 - 0.70 IU/mL  T4, free  Result Value Ref Range   Free T4 0.94 0.61 - 1.12 ng/dL  TSH  Result Value Ref Range   TSH 3.558 0.350 - 4.500 uIU/mL  Lyme disease, western blot  Result Value Ref Range     IgG P93 Ab. Absent      IgG P66 Ab. Absent      IgG P58 Ab. Absent       IgG P45 Ab. Absent      IgG P41 Ab. Present (A)      IgG P39 Ab. Absent      IgG P30 Ab. Absent      IgG P28 Ab. Absent      IgG P23 Ab. Absent      IgG P18 Ab. Absent    Lyme IgG Wb Negative      IgM P41 Ab. Absent      IgM P39 Ab. Absent      IgM P23 Ab. Absent    Lyme IgM Wb Negative   Heparin level (unfractionated)  Result Value Ref Range   Heparin Unfractionated 0.58 0.30 - 0.70 IU/mL  CBC with Differential/Platelet  Result Value Ref Range   WBC 20.7 (H) 4.0 - 10.5 K/uL   RBC 5.47 (H) 3.87 - 5.11 MIL/uL   Hemoglobin 15.4 (H) 12.0 - 15.0 g/dL   HCT 46.6 (H) 36 - 46 %   MCV 85.2 80.0 - 100.0 fL   MCH 28.2 26.0 - 34.0 pg   MCHC 33.0 30.0 - 36.0 g/dL   RDW 15.3 11.5 - 15.5 %   Platelets 429 (H) 150 - 400 K/uL   nRBC 0.3 (H) 0.0 - 0.2 %   Neutrophils Relative % 89 %   Neutro Abs 18.4 (H) 1.7 - 7.7 K/uL   Lymphocytes Relative 6 %   Lymphs Abs 1.3 0.7 - 4.0 K/uL   Monocytes Relative 2 %   Monocytes Absolute 0.4 0.1 - 1.0 K/uL   Eosinophils Relative 0 %   Eosinophils Absolute 0.0 0.0 - 0.5 K/uL   Basophils Relative 0 %   Basophils Absolute 0.1 0.0 - 0.1 K/uL   Immature Granulocytes 3 %   Abs Immature Granulocytes 0.52 (H) 0.00 - 0.07 K/uL  Comprehensive metabolic panel  Result Value Ref Range   Sodium 140 135 - 145 mmol/L   Potassium 3.7 3.5 -  5.1 mmol/L   Chloride 104 98 - 111 mmol/L   CO2 26 22 - 32 mmol/L   Glucose, Bld 132 (H) 70 - 99 mg/dL   BUN 16 8 - 23 mg/dL   Creatinine, Ser 0.88 0.44 - 1.00 mg/dL   Calcium 9.8 8.9 - 10.3 mg/dL   Total Protein 7.0 6.5 - 8.1 g/dL   Albumin 2.6 (L) 3.5 - 5.0 g/dL   AST 34 15 - 41 U/L   ALT 27 0 - 44 U/L   Alkaline Phosphatase 73 38 - 126 U/L   Total Bilirubin 0.8 0.3 - 1.2 mg/dL   GFR calc non Af Amer >60 >60 mL/min   GFR calc Af Amer >60 >60 mL/min   Anion gap 10 5 - 15  C-reactive protein  Result Value Ref Range   CRP 6.9 (H) <1.0 mg/dL  Fibrin derivatives D-Dimer ([facility] only)  Result Value Ref Range   Fibrin  derivatives D-dimer ([facility]) 1,182.22 (H) 0.00 - 499.00 ng/mL (FEU)  Ferritin  Result Value Ref Range   Ferritin 566 (H) 11 - 307 ng/mL  Magnesium  Result Value Ref Range   Magnesium 1.7 1.7 - 2.4 mg/dL  Phosphorus  Result Value Ref Range   Phosphorus 2.5 2.5 - 4.6 mg/dL  Heparin level (unfractionated)  Result Value Ref Range   Heparin Unfractionated 0.43 0.30 - 0.70 IU/mL  Blood gas, arterial  Result Value Ref Range   FIO2  12L HFNC    Delivery systems NASAL CANNULA    pH, Arterial 7.61 (HH) 7.35 - 7.45   pCO2 arterial 26 (L) 32 - 48 mmHg   pO2, Arterial 43 (L) 83 - 108 mmHg   Bicarbonate 26.1 20.0 - 28.0 mmol/L   Acid-Base Excess 5.9 (H) 0.0 - 2.0 mmol/L   O2 Saturation 87.7 %   Patient temperature 37.0    Collection site LEFT RADIAL    Sample type VENOUS    Allens test (pass/fail) PASS PASS  Digoxin level  Result Value Ref Range   Digoxin Level 1.2 1.0 - 2.0 ng/mL  Heparin level (unfractionated)  Result Value Ref Range   Heparin Unfractionated 0.25 (L) 0.30 - 0.70 IU/mL  Basic metabolic panel  Result Value Ref Range   Sodium 139 135 - 145 mmol/L   Potassium 3.8 3.5 - 5.1 mmol/L   Chloride 101 98 - 111 mmol/L   CO2 27 22 - 32 mmol/L   Glucose, Bld 143 (H) 70 - 99 mg/dL   BUN 18 8 - 23 mg/dL   Creatinine, Ser 0.77 0.44 - 1.00 mg/dL   Calcium 9.4 8.9 - 10.3 mg/dL   GFR calc non Af Amer >60 >60 mL/min   GFR calc Af Amer >60 >60 mL/min   Anion gap 11 5 - 15  Magnesium  Result Value Ref Range   Magnesium 1.9 1.7 - 2.4 mg/dL  Heparin level (unfractionated)  Result Value Ref Range   Heparin Unfractionated <0.10 (L) 0.30 - 0.70 IU/mL  Blood gas, arterial  Result Value Ref Range   FIO2 0.85    Mode HI FLOW NASAL CANNULA    pH, Arterial 7.56 (H) 7.35 - 7.45   pCO2 arterial 33 32 - 48 mmHg   pO2, Arterial 44 (L) 83 - 108 mmHg   Bicarbonate 29.5 (H) 20.0 - 28.0 mmol/L   Acid-Base Excess 7.4 (H) 0.0 - 2.0 mmol/L   O2 Saturation 86.6 %   Patient temperature 37.0     Collection site LEFT BRACHIAL  Sample type ARTERIAL DRAW   CBC  Result Value Ref Range   WBC 19.0 (H) 4.0 - 10.5 K/uL   RBC 5.43 (H) 3.87 - 5.11 MIL/uL   Hemoglobin 15.4 (H) 12.0 - 15.0 g/dL   HCT 45.4 36 - 46 %   MCV 83.6 80.0 - 100.0 fL   MCH 28.4 26.0 - 34.0 pg   MCHC 33.9 30.0 - 36.0 g/dL   RDW 15.1 11.5 - 15.5 %   Platelets 387 150 - 400 K/uL   nRBC 0.3 (H) 0.0 - 0.2 %  Blood gas, arterial  Result Value Ref Range   FIO2 100.00    Delivery systems VENTILATOR    Mode PRESSURE REGULATED VOLUME CONTROL    VT 430 mL   LHR 30 resp/min   Peep/cpap 10.0 cm H20   pH, Arterial 7.38 7.35 - 7.45   pCO2 arterial 53 (H) 32 - 48 mmHg   pO2, Arterial 65 (L) 83 - 108 mmHg   Bicarbonate 31.4 (H) 20.0 - 28.0 mmol/L   Acid-Base Excess 4.9 (H) 0.0 - 2.0 mmol/L   O2 Saturation 92.0 %   Patient temperature 37.0    Collection site RIGHT BRACHIAL    Sample type ARTERIAL DRAW    Allens test (pass/fail) PASS PASS  Glucose, capillary  Result Value Ref Range   Glucose-Capillary 163 (H) 70 - 99 mg/dL  Triglycerides  Result Value Ref Range   Triglycerides 93 <150 mg/dL  Blood gas, arterial  Result Value Ref Range   FIO2 1.00    Delivery systems VENTILATOR    Mode PRESSURE REGULATED VOLUME CONTROL    VT 430 mL   LHR 30 resp/min   Peep/cpap 15.0 cm H20   pH, Arterial 7.40 7.35 - 7.45   pCO2 arterial 47 32 - 48 mmHg   pO2, Arterial 78 (L) 83 - 108 mmHg   Bicarbonate 29.1 (H) 20.0 - 28.0 mmol/L   Acid-Base Excess 3.4 (H) 0.0 - 2.0 mmol/L   O2 Saturation 95.7 %   Patient temperature 37.0    Collection site RIGHT RADIAL    Sample type ARTERIAL DRAW    Allens test (pass/fail) PASS PASS   Mechanical Rate 30   Glucose, capillary  Result Value Ref Range   Glucose-Capillary 128 (H) 70 - 99 mg/dL  Heparin level (unfractionated)  Result Value Ref Range   Heparin Unfractionated 0.44 0.30 - 0.70 IU/mL  Basic metabolic panel  Result Value Ref Range   Sodium 138 135 - 145 mmol/L   Potassium  4.7 3.5 - 5.1 mmol/L   Chloride 99 98 - 111 mmol/L   CO2 28 22 - 32 mmol/L   Glucose, Bld 145 (H) 70 - 99 mg/dL   BUN 32 (H) 8 - 23 mg/dL   Creatinine, Ser 2.06 (H) 0.44 - 1.00 mg/dL   Calcium 10.1 8.9 - 10.3 mg/dL   GFR calc non Af Amer 25 (L) >60 mL/min   GFR calc Af Amer 29 (L) >60 mL/min   Anion gap 11 5 - 15  Magnesium  Result Value Ref Range   Magnesium 2.4 1.7 - 2.4 mg/dL  Basic metabolic panel  Result Value Ref Range   Sodium 136 135 - 145 mmol/L   Potassium 5.1 3.5 - 5.1 mmol/L   Chloride 101 98 - 111 mmol/L   CO2 25 22 - 32 mmol/L   Glucose, Bld 160 (H) 70 - 99 mg/dL   BUN 43 (H) 8 - 23 mg/dL   Creatinine,  Ser 2.27 (H) 0.44 - 1.00 mg/dL   Calcium 9.4 8.9 - 10.3 mg/dL   GFR calc non Af Amer 22 (L) >60 mL/min   GFR calc Af Amer 25 (L) >60 mL/min   Anion gap 10 5 - 15  Magnesium  Result Value Ref Range   Magnesium 2.1 1.7 - 2.4 mg/dL  Digoxin level  Result Value Ref Range   Digoxin Level 3.2 (HH) 1.0 - 2.0 ng/mL  Glucose, capillary  Result Value Ref Range   Glucose-Capillary 183 (H) 70 - 99 mg/dL  Phosphorus  Result Value Ref Range   Phosphorus 5.0 (H) 2.5 - 4.6 mg/dL  Glucose, capillary  Result Value Ref Range   Glucose-Capillary 159 (H) 70 - 99 mg/dL  Glucose, capillary  Result Value Ref Range   Glucose-Capillary 168 (H) 70 - 99 mg/dL  Magnesium  Result Value Ref Range   Magnesium 2.1 1.7 - 2.4 mg/dL  Glucose, capillary  Result Value Ref Range   Glucose-Capillary 172 (H) 70 - 99 mg/dL  Glucose, capillary  Result Value Ref Range   Glucose-Capillary 167 (H) 70 - 99 mg/dL  Triglycerides  Result Value Ref Range   Triglycerides 110 <150 mg/dL  CBC with Differential/Platelet  Result Value Ref Range   WBC 14.0 (H) 4.0 - 10.5 K/uL   RBC 4.39 3.87 - 5.11 MIL/uL   Hemoglobin 12.2 12.0 - 15.0 g/dL   HCT 37.9 36 - 46 %   MCV 86.3 80.0 - 100.0 fL   MCH 27.8 26.0 - 34.0 pg   MCHC 32.2 30.0 - 36.0 g/dL   RDW 14.6 11.5 - 15.5 %   Platelets 107 (L) 150 - 400  K/uL   nRBC 0.0 0.0 - 0.2 %   Neutrophils Relative % 91 %   Neutro Abs 12.7 (H) 1.7 - 7.7 K/uL   Lymphocytes Relative 3 %   Lymphs Abs 0.5 (L) 0.7 - 4.0 K/uL   Monocytes Relative 5 %   Monocytes Absolute 0.6 0.1 - 1.0 K/uL   Eosinophils Relative 0 %   Eosinophils Absolute 0.0 0.0 - 0.5 K/uL   Basophils Relative 0 %   Basophils Absolute 0.0 0.0 - 0.1 K/uL   Immature Granulocytes 1 %   Abs Immature Granulocytes 0.09 (H) 0.00 - 0.07 K/uL  Renal function panel  Result Value Ref Range   Sodium 136 135 - 145 mmol/L   Potassium 5.3 (H) 3.5 - 5.1 mmol/L   Chloride 102 98 - 111 mmol/L   CO2 26 22 - 32 mmol/L   Glucose, Bld 165 (H) 70 - 99 mg/dL   BUN 50 (H) 8 - 23 mg/dL   Creatinine, Ser 2.07 (H) 0.44 - 1.00 mg/dL   Calcium 9.5 8.9 - 10.3 mg/dL   Phosphorus 4.4 2.5 - 4.6 mg/dL   Albumin 2.1 (L) 3.5 - 5.0 g/dL   GFR calc non Af Amer 25 (L) >60 mL/min   GFR calc Af Amer 28 (L) >60 mL/min   Anion gap 8 5 - 15  Glucose, capillary  Result Value Ref Range   Glucose-Capillary 153 (H) 70 - 99 mg/dL  Glucose, capillary  Result Value Ref Range   Glucose-Capillary 143 (H) 70 - 99 mg/dL  Glucose, capillary  Result Value Ref Range   Glucose-Capillary 180 (H) 70 - 99 mg/dL  Glucose, capillary  Result Value Ref Range   Glucose-Capillary 209 (H) 70 - 99 mg/dL  Basic metabolic panel  Result Value Ref Range   Sodium 139 135 -  145 mmol/L   Potassium 4.8 3.5 - 5.1 mmol/L   Chloride 104 98 - 111 mmol/L   CO2 26 22 - 32 mmol/L   Glucose, Bld 182 (H) 70 - 99 mg/dL   BUN 56 (H) 8 - 23 mg/dL   Creatinine, Ser 1.65 (H) 0.44 - 1.00 mg/dL   Calcium 9.5 8.9 - 10.3 mg/dL   GFR calc non Af Amer 32 (L) >60 mL/min   GFR calc Af Amer 37 (L) >60 mL/min   Anion gap 9 5 - 15  Magnesium  Result Value Ref Range   Magnesium 2.1 1.7 - 2.4 mg/dL  Phosphorus  Result Value Ref Range   Phosphorus 3.6 2.5 - 4.6 mg/dL  Glucose, capillary  Result Value Ref Range   Glucose-Capillary 205 (H) 70 - 99 mg/dL    Comment 1 Notify RN    Comment 2 Document in Chart   Glucose, capillary  Result Value Ref Range   Glucose-Capillary 176 (H) 70 - 99 mg/dL   Comment 1 Notify RN    Comment 2 Document in Chart   Glucose, capillary  Result Value Ref Range   Glucose-Capillary 190 (H) 70 - 99 mg/dL   Comment 1 Notify RN    Comment 2 Document in Chart   Hemoglobin A1c  Result Value Ref Range   Hgb A1c MFr Bld 7.1 (H) 4.8 - 5.6 %   Mean Plasma Glucose 157.07 mg/dL  Glucose, capillary  Result Value Ref Range   Glucose-Capillary 177 (H) 70 - 99 mg/dL  Glucose, capillary  Result Value Ref Range   Glucose-Capillary 185 (H) 70 - 99 mg/dL  Glucose, capillary  Result Value Ref Range   Glucose-Capillary 195 (H) 70 - 99 mg/dL  Glucose, capillary  Result Value Ref Range   Glucose-Capillary 188 (H) 70 - 99 mg/dL   Comment 1 Document in Chart   Glucose, capillary  Result Value Ref Range   Glucose-Capillary 160 (H) 70 - 99 mg/dL  Basic metabolic panel  Result Value Ref Range   Sodium 141 135 - 145 mmol/L   Potassium 4.3 3.5 - 5.1 mmol/L   Chloride 104 98 - 111 mmol/L   CO2 27 22 - 32 mmol/L   Glucose, Bld 181 (H) 70 - 99 mg/dL   BUN 64 (H) 8 - 23 mg/dL   Creatinine, Ser 1.42 (H) 0.44 - 1.00 mg/dL   Calcium 9.5 8.9 - 10.3 mg/dL   GFR calc non Af Amer 39 (L) >60 mL/min   GFR calc Af Amer 45 (L) >60 mL/min   Anion gap 10 5 - 15  Phosphorus  Result Value Ref Range   Phosphorus 3.0 2.5 - 4.6 mg/dL  Magnesium  Result Value Ref Range   Magnesium 2.0 1.7 - 2.4 mg/dL  Glucose, capillary  Result Value Ref Range   Glucose-Capillary 170 (H) 70 - 99 mg/dL  Glucose, capillary  Result Value Ref Range   Glucose-Capillary 157 (H) 70 - 99 mg/dL  Glucose, capillary  Result Value Ref Range   Glucose-Capillary 173 (H) 70 - 99 mg/dL  Glucose, capillary  Result Value Ref Range   Glucose-Capillary 230 (H) 70 - 99 mg/dL  Magnesium  Result Value Ref Range   Magnesium 2.0 1.7 - 2.4 mg/dL  Glucose, capillary    Result Value Ref Range   Glucose-Capillary 241 (H) 70 - 99 mg/dL  Triglycerides  Result Value Ref Range   Triglycerides 79 <150 mg/dL  Glucose, capillary  Result Value Ref Range  Glucose-Capillary 215 (H) 70 - 99 mg/dL  Glucose, capillary  Result Value Ref Range   Glucose-Capillary 148 (H) 70 - 99 mg/dL  Glucose, capillary  Result Value Ref Range   Glucose-Capillary 185 (H) 70 - 99 mg/dL  Glucose, capillary  Result Value Ref Range   Glucose-Capillary 202 (H) 70 - 99 mg/dL  Glucose, capillary  Result Value Ref Range   Glucose-Capillary 251 (H) 70 - 99 mg/dL  Magnesium  Result Value Ref Range   Magnesium 2.0 1.7 - 2.4 mg/dL  Glucose, capillary  Result Value Ref Range   Glucose-Capillary 246 (H) 70 - 99 mg/dL  Glucose, capillary  Result Value Ref Range   Glucose-Capillary 198 (H) 70 - 99 mg/dL  CBC with Differential/Platelet  Result Value Ref Range   WBC 7.9 4.0 - 10.5 K/uL   RBC 4.33 3.87 - 5.11 MIL/uL   Hemoglobin 12.0 12.0 - 15.0 g/dL   HCT 36.5 36 - 46 %   MCV 84.3 80.0 - 100.0 fL   MCH 27.7 26.0 - 34.0 pg   MCHC 32.9 30.0 - 36.0 g/dL   RDW 15.3 11.5 - 15.5 %   Platelets 131 (L) 150 - 400 K/uL   nRBC 0.0 0.0 - 0.2 %   Neutrophils Relative % 83 %   Neutro Abs 6.5 1.7 - 7.7 K/uL   Lymphocytes Relative 6 %   Lymphs Abs 0.5 (L) 0.7 - 4.0 K/uL   Monocytes Relative 9 %   Monocytes Absolute 0.7 0.1 - 1.0 K/uL   Eosinophils Relative 1 %   Eosinophils Absolute 0.0 0.0 - 0.5 K/uL   Basophils Relative 0 %   Basophils Absolute 0.0 0.0 - 0.1 K/uL   Immature Granulocytes 1 %   Abs Immature Granulocytes 0.11 (H) 0.00 - 0.07 K/uL  Basic metabolic panel  Result Value Ref Range   Sodium 144 135 - 145 mmol/L   Potassium 4.1 3.5 - 5.1 mmol/L   Chloride 109 98 - 111 mmol/L   CO2 30 22 - 32 mmol/L   Glucose, Bld 178 (H) 70 - 99 mg/dL   BUN 55 (H) 8 - 23 mg/dL   Creatinine, Ser 1.11 (H) 0.44 - 1.00 mg/dL   Calcium 9.4 8.9 - 10.3 mg/dL   GFR calc non Af Amer 52 (L) >60  mL/min   GFR calc Af Amer >60 >60 mL/min   Anion gap 5 5 - 15  Glucose, capillary  Result Value Ref Range   Glucose-Capillary 169 (H) 70 - 99 mg/dL  Glucose, capillary  Result Value Ref Range   Glucose-Capillary 151 (H) 70 - 99 mg/dL  Glucose, capillary  Result Value Ref Range   Glucose-Capillary 207 (H) 70 - 99 mg/dL  Glucose, capillary  Result Value Ref Range   Glucose-Capillary 244 (H) 70 - 99 mg/dL  Magnesium  Result Value Ref Range   Magnesium 2.0 1.7 - 2.4 mg/dL  Glucose, capillary  Result Value Ref Range   Glucose-Capillary 229 (H) 70 - 99 mg/dL  Renal function panel  Result Value Ref Range   Sodium 144 135 - 145 mmol/L   Potassium 4.2 3.5 - 5.1 mmol/L   Chloride 109 98 - 111 mmol/L   CO2 27 22 - 32 mmol/L   Glucose, Bld 155 (H) 70 - 99 mg/dL   BUN 57 (H) 8 - 23 mg/dL   Creatinine, Ser 1.15 (H) 0.44 - 1.00 mg/dL   Calcium 9.7 8.9 - 10.3 mg/dL   Phosphorus 2.4 (L) 2.5 -  4.6 mg/dL   Albumin 2.1 (L) 3.5 - 5.0 g/dL   GFR calc non Af Amer 50 (L) >60 mL/min   GFR calc Af Amer 58 (L) >60 mL/min   Anion gap 8 5 - 15  Glucose, capillary  Result Value Ref Range   Glucose-Capillary 237 (H) 70 - 99 mg/dL  Glucose, capillary  Result Value Ref Range   Glucose-Capillary 153 (H) 70 - 99 mg/dL  Glucose, capillary  Result Value Ref Range   Glucose-Capillary 148 (H) 70 - 99 mg/dL  CBC with Differential/Platelet  Result Value Ref Range   WBC 12.1 (H) 4.0 - 10.5 K/uL   RBC 4.34 3.87 - 5.11 MIL/uL   Hemoglobin 12.0 12.0 - 15.0 g/dL   HCT 37.7 36 - 46 %   MCV 86.9 80.0 - 100.0 fL   MCH 27.6 26.0 - 34.0 pg   MCHC 31.8 30.0 - 36.0 g/dL   RDW 15.5 11.5 - 15.5 %   Platelets 123 (L) 150 - 400 K/uL   nRBC 0.0 0.0 - 0.2 %   Neutrophils Relative % 85 %   Neutro Abs 10.4 (H) 1.7 - 7.7 K/uL   Lymphocytes Relative 6 %   Lymphs Abs 0.7 0.7 - 4.0 K/uL   Monocytes Relative 6 %   Monocytes Absolute 0.7 0.1 - 1.0 K/uL   Eosinophils Relative 2 %   Eosinophils Absolute 0.2 0.0 - 0.5  K/uL   Basophils Relative 0 %   Basophils Absolute 0.0 0.0 - 0.1 K/uL   Immature Granulocytes 1 %   Abs Immature Granulocytes 0.07 0.00 - 0.07 K/uL  Blood gas, venous  Result Value Ref Range   pH, Ven 7.43 7.25 - 7.43   pCO2, Ven 42 (L) 44 - 60 mmHg   pO2, Ven 41.0 32 - 45 mmHg   Bicarbonate 27.9 20.0 - 28.0 mmol/L   Acid-Base Excess 3.2 (H) 0.0 - 2.0 mmol/L   O2 Saturation 77.9 %   Patient temperature 37.0    Collection site VEIN    Sample type VEIN   Glucose, capillary  Result Value Ref Range   Glucose-Capillary 200 (H) 70 - 99 mg/dL  Glucose, capillary  Result Value Ref Range   Glucose-Capillary 265 (H) 70 - 99 mg/dL  Magnesium  Result Value Ref Range   Magnesium 2.0 1.7 - 2.4 mg/dL  CBC with Differential/Platelet  Result Value Ref Range   WBC 8.1 4.0 - 10.5 K/uL   RBC 4.07 3.87 - 5.11 MIL/uL   Hemoglobin 11.3 (L) 12.0 - 15.0 g/dL   HCT 34.7 (L) 36 - 46 %   MCV 85.3 80.0 - 100.0 fL   MCH 27.8 26.0 - 34.0 pg   MCHC 32.6 30.0 - 36.0 g/dL   RDW 15.8 (H) 11.5 - 15.5 %   Platelets 123 (L) 150 - 400 K/uL   nRBC 0.0 0.0 - 0.2 %   Neutrophils Relative % 87 %   Neutro Abs 7.0 1.7 - 7.7 K/uL   Lymphocytes Relative 6 %   Lymphs Abs 0.5 (L) 0.7 - 4.0 K/uL   Monocytes Relative 7 %   Monocytes Absolute 0.5 0.1 - 1.0 K/uL   Eosinophils Relative 0 %   Eosinophils Absolute 0.0 0.0 - 0.5 K/uL   Basophils Relative 0 %   Basophils Absolute 0.0 0.0 - 0.1 K/uL   Immature Granulocytes 0 %   Abs Immature Granulocytes 0.03 0.00 - 0.07 K/uL  Comprehensive metabolic panel  Result Value Ref Range  Sodium 143 135 - 145 mmol/L   Potassium 4.3 3.5 - 5.1 mmol/L   Chloride 111 98 - 111 mmol/L   CO2 26 22 - 32 mmol/L   Glucose, Bld 287 (H) 70 - 99 mg/dL   BUN 55 (H) 8 - 23 mg/dL   Creatinine, Ser 1.22 (H) 0.44 - 1.00 mg/dL   Calcium 9.4 8.9 - 10.3 mg/dL   Total Protein 5.9 (L) 6.5 - 8.1 g/dL   Albumin 2.1 (L) 3.5 - 5.0 g/dL   AST 47 (H) 15 - 41 U/L   ALT 102 (H) 0 - 44 U/L   Alkaline  Phosphatase 53 38 - 126 U/L   Total Bilirubin 0.6 0.3 - 1.2 mg/dL   GFR calc non Af Amer 46 (L) >60 mL/min   GFR calc Af Amer 54 (L) >60 mL/min   Anion gap 6 5 - 15  Glucose, capillary  Result Value Ref Range   Glucose-Capillary 275 (H) 70 - 99 mg/dL  Glucose, capillary  Result Value Ref Range   Glucose-Capillary 226 (H) 70 - 99 mg/dL  Glucose, capillary  Result Value Ref Range   Glucose-Capillary 264 (H) 70 - 99 mg/dL  Glucose, capillary  Result Value Ref Range   Glucose-Capillary 257 (H) 70 - 99 mg/dL  Glucose, capillary  Result Value Ref Range   Glucose-Capillary 228 (H) 70 - 99 mg/dL  Glucose, capillary  Result Value Ref Range   Glucose-Capillary 248 (H) 70 - 99 mg/dL  Magnesium  Result Value Ref Range   Magnesium 1.9 1.7 - 2.4 mg/dL  CBC with Differential/Platelet  Result Value Ref Range   WBC 9.5 4.0 - 10.5 K/uL   RBC 4.12 3.87 - 5.11 MIL/uL   Hemoglobin 11.3 (L) 12.0 - 15.0 g/dL   HCT 36.5 36 - 46 %   MCV 88.6 80.0 - 100.0 fL   MCH 27.4 26.0 - 34.0 pg   MCHC 31.0 30.0 - 36.0 g/dL   RDW 15.9 (H) 11.5 - 15.5 %   Platelets 136 (L) 150 - 400 K/uL   nRBC 0.0 0.0 - 0.2 %   Neutrophils Relative % 82 %   Neutro Abs 7.7 1.7 - 7.7 K/uL   Lymphocytes Relative 8 %   Lymphs Abs 0.7 0.7 - 4.0 K/uL   Monocytes Relative 6 %   Monocytes Absolute 0.6 0.1 - 1.0 K/uL   Eosinophils Relative 4 %   Eosinophils Absolute 0.4 0.0 - 0.5 K/uL   Basophils Relative 0 %   Basophils Absolute 0.0 0.0 - 0.1 K/uL   Immature Granulocytes 0 %   Abs Immature Granulocytes 0.04 0.00 - 0.07 K/uL  Comprehensive metabolic panel  Result Value Ref Range   Sodium 148 (H) 135 - 145 mmol/L   Potassium 4.1 3.5 - 5.1 mmol/L   Chloride 114 (H) 98 - 111 mmol/L   CO2 26 22 - 32 mmol/L   Glucose, Bld 198 (H) 70 - 99 mg/dL   BUN 55 (H) 8 - 23 mg/dL   Creatinine, Ser 0.96 0.44 - 1.00 mg/dL   Calcium 9.8 8.9 - 10.3 mg/dL   Total Protein 6.0 (L) 6.5 - 8.1 g/dL   Albumin 2.3 (L) 3.5 - 5.0 g/dL   AST 51 (H)  15 - 41 U/L   ALT 110 (H) 0 - 44 U/L   Alkaline Phosphatase 52 38 - 126 U/L   Total Bilirubin 0.7 0.3 - 1.2 mg/dL   GFR calc non Af Amer >60 >60 mL/min  Anion gap 8 5 - 15  Blood gas, venous  Result Value Ref Range   FIO2 0.35    Delivery systems VENTILATOR    Mode PRESSURE REGULATED VOLUME CONTROL    VT 430 mL   LHR 30 resp/min   Peep/cpap 5.0 cm H20   pH, Ven 7.41 7.25 - 7.43   pCO2, Ven 41 (L) 44 - 60 mmHg   pO2, Ven 34.0 32 - 45 mmHg   Bicarbonate 26.0 20.0 - 28.0 mmol/L   Acid-Base Excess 1.2 0.0 - 2.0 mmol/L   O2 Saturation 66.2 %   Patient temperature 37.0    Collection site LINE    Sample type VENOUS   Glucose, capillary  Result Value Ref Range   Glucose-Capillary 193 (H) 70 - 99 mg/dL  Phosphorus  Result Value Ref Range   Phosphorus 2.4 (L) 2.5 - 4.6 mg/dL  Glucose, capillary  Result Value Ref Range   Glucose-Capillary 178 (H) 70 - 99 mg/dL  Glucose, capillary  Result Value Ref Range   Glucose-Capillary 174 (H) 70 - 99 mg/dL  Glucose, capillary  Result Value Ref Range   Glucose-Capillary 119 (H) 70 - 99 mg/dL  Glucose, capillary  Result Value Ref Range   Glucose-Capillary 127 (H) 70 - 99 mg/dL  Magnesium  Result Value Ref Range   Magnesium 2.2 1.7 - 2.4 mg/dL  CBC with Differential/Platelet  Result Value Ref Range   WBC 7.0 4.0 - 10.5 K/uL   RBC 3.76 (L) 3.87 - 5.11 MIL/uL   Hemoglobin 10.6 (L) 12.0 - 15.0 g/dL   HCT 32.9 (L) 36 - 46 %   MCV 87.5 80.0 - 100.0 fL   MCH 28.2 26.0 - 34.0 pg   MCHC 32.2 30.0 - 36.0 g/dL   RDW 16.2 (H) 11.5 - 15.5 %   Platelets 119 (L) 150 - 400 K/uL   nRBC 0.0 0.0 - 0.2 %   Neutrophils Relative % 77 %   Neutro Abs 5.3 1.7 - 7.7 K/uL   Lymphocytes Relative 11 %   Lymphs Abs 0.8 0.7 - 4.0 K/uL   Monocytes Relative 6 %   Monocytes Absolute 0.4 0.1 - 1.0 K/uL   Eosinophils Relative 6 %   Eosinophils Absolute 0.4 0.0 - 0.5 K/uL   Basophils Relative 0 %   Basophils Absolute 0.0 0.0 - 0.1 K/uL   Immature Granulocytes 0  %   Abs Immature Granulocytes 0.02 0.00 - 0.07 K/uL  Comprehensive metabolic panel  Result Value Ref Range   Sodium 150 (H) 135 - 145 mmol/L   Potassium 4.1 3.5 - 5.1 mmol/L   Chloride 116 (H) 98 - 111 mmol/L   CO2 28 22 - 32 mmol/L   Glucose, Bld 152 (H) 70 - 99 mg/dL   BUN 53 (H) 8 - 23 mg/dL   Creatinine, Ser 1.18 (H) 0.44 - 1.00 mg/dL   Calcium 9.5 8.9 - 10.3 mg/dL   Total Protein 5.5 (L) 6.5 - 8.1 g/dL   Albumin 2.3 (L) 3.5 - 5.0 g/dL   AST 37 15 - 41 U/L   ALT 83 (H) 0 - 44 U/L   Alkaline Phosphatase 42 38 - 126 U/L   Total Bilirubin 0.6 0.3 - 1.2 mg/dL   GFR calc non Af Amer 48 (L) >60 mL/min   Anion gap 6 5 - 15  Blood gas, venous  Result Value Ref Range   FIO2 0.50    Delivery systems VENTILATOR    Mode PRESSURE REGULATED VOLUME  CONTROL    VT 430 mL   LHR 30 resp/min   Peep/cpap 5.0 cm H20   pH, Ven 7.41 7.25 - 7.43   pCO2, Ven 47 44 - 60 mmHg   pO2, Ven 38.0 32 - 45 mmHg   Bicarbonate 29.8 (H) 20.0 - 28.0 mmol/L   Acid-Base Excess 4.4 (H) 0.0 - 2.0 mmol/L   O2 Saturation 72.8 %   Patient temperature 37.0    Collection site VEIN    Sample type VENIPUNCTURE   Phosphorus  Result Value Ref Range   Phosphorus 3.0 2.5 - 4.6 mg/dL  Glucose, capillary  Result Value Ref Range   Glucose-Capillary 178 (H) 70 - 99 mg/dL  Glucose, capillary  Result Value Ref Range   Glucose-Capillary 184 (H) 70 - 99 mg/dL  Glucose, capillary  Result Value Ref Range   Glucose-Capillary 118 (H) 70 - 99 mg/dL  Glucose, capillary  Result Value Ref Range   Glucose-Capillary 147 (H) 70 - 99 mg/dL  Glucose, capillary  Result Value Ref Range   Glucose-Capillary 150 (H) 70 - 99 mg/dL  Glucose, capillary  Result Value Ref Range   Glucose-Capillary 153 (H) 70 - 99 mg/dL  Glucose, capillary  Result Value Ref Range   Glucose-Capillary 131 (H) 70 - 99 mg/dL  Magnesium  Result Value Ref Range   Magnesium 1.9 1.7 - 2.4 mg/dL  CBC with Differential/Platelet  Result Value Ref Range   WBC  7.8 4.0 - 10.5 K/uL   RBC 4.01 3.87 - 5.11 MIL/uL   Hemoglobin 11.2 (L) 12.0 - 15.0 g/dL   HCT 35.6 (L) 36 - 46 %   MCV 88.8 80.0 - 100.0 fL   MCH 27.9 26.0 - 34.0 pg   MCHC 31.5 30.0 - 36.0 g/dL   RDW 16.3 (H) 11.5 - 15.5 %   Platelets 137 (L) 150 - 400 K/uL   nRBC 0.0 0.0 - 0.2 %   Neutrophils Relative % 82 %   Neutro Abs 6.4 1.7 - 7.7 K/uL   Lymphocytes Relative 10 %   Lymphs Abs 0.8 0.7 - 4.0 K/uL   Monocytes Relative 6 %   Monocytes Absolute 0.4 0.1 - 1.0 K/uL   Eosinophils Relative 2 %   Eosinophils Absolute 0.2 0.0 - 0.5 K/uL   Basophils Relative 0 %   Basophils Absolute 0.0 0.0 - 0.1 K/uL   Immature Granulocytes 0 %   Abs Immature Granulocytes 0.03 0.00 - 0.07 K/uL  Comprehensive metabolic panel  Result Value Ref Range   Sodium 151 (H) 135 - 145 mmol/L   Potassium 3.5 3.5 - 5.1 mmol/L   Chloride 115 (H) 98 - 111 mmol/L   CO2 25 22 - 32 mmol/L   Glucose, Bld 132 (H) 70 - 99 mg/dL   BUN 44 (H) 8 - 23 mg/dL   Creatinine, Ser 1.04 (H) 0.44 - 1.00 mg/dL   Calcium 9.8 8.9 - 10.3 mg/dL   Total Protein 5.8 (L) 6.5 - 8.1 g/dL   Albumin 2.5 (L) 3.5 - 5.0 g/dL   AST 43 (H) 15 - 41 U/L   ALT 80 (H) 0 - 44 U/L   Alkaline Phosphatase 47 38 - 126 U/L   Total Bilirubin 0.8 0.3 - 1.2 mg/dL   GFR calc non Af Amer 56 (L) >60 mL/min   Anion gap 11 5 - 15  Blood gas, venous  Result Value Ref Range   pH, Ven 7.44 (H) 7.25 - 7.43   pCO2, Ven 43 (L)  44 - 60 mmHg   pO2, Ven 41.0 32 - 45 mmHg   Bicarbonate 29.2 (H) 20.0 - 28.0 mmol/L   Acid-Base Excess 4.5 (H) 0.0 - 2.0 mmol/L   O2 Saturation 78.4 %   Patient temperature 37.0    Collection site VEIN   Phosphorus  Result Value Ref Range   Phosphorus 2.4 (L) 2.5 - 4.6 mg/dL  Glucose, capillary  Result Value Ref Range   Glucose-Capillary 105 (H) 70 - 99 mg/dL  Glucose, capillary  Result Value Ref Range   Glucose-Capillary 127 (H) 70 - 99 mg/dL  Glucose, capillary  Result Value Ref Range   Glucose-Capillary 159 (H) 70 - 99  mg/dL  Triglycerides  Result Value Ref Range   Triglycerides 83 <150 mg/dL  Glucose, capillary  Result Value Ref Range   Glucose-Capillary 152 (H) 70 - 99 mg/dL  Magnesium  Result Value Ref Range   Magnesium 2.0 1.7 - 2.4 mg/dL  CBC with Differential/Platelet  Result Value Ref Range   WBC 7.4 4.0 - 10.5 K/uL   RBC 3.52 (L) 3.87 - 5.11 MIL/uL   Hemoglobin 10.0 (L) 12.0 - 15.0 g/dL   HCT 32.2 (L) 36 - 46 %   MCV 91.5 80.0 - 100.0 fL   MCH 28.4 26.0 - 34.0 pg   MCHC 31.1 30.0 - 36.0 g/dL   RDW 17.1 (H) 11.5 - 15.5 %   Platelets 153 150 - 400 K/uL   nRBC 0.0 0.0 - 0.2 %   Neutrophils Relative % 78 %   Neutro Abs 5.8 1.7 - 7.7 K/uL   Lymphocytes Relative 10 %   Lymphs Abs 0.8 0.7 - 4.0 K/uL   Monocytes Relative 7 %   Monocytes Absolute 0.5 0.1 - 1.0 K/uL   Eosinophils Relative 5 %   Eosinophils Absolute 0.4 0.0 - 0.5 K/uL   Basophils Relative 0 %   Basophils Absolute 0.0 0.0 - 0.1 K/uL   Immature Granulocytes 0 %   Abs Immature Granulocytes 0.03 0.00 - 0.07 K/uL  Comprehensive metabolic panel  Result Value Ref Range   Sodium 146 (H) 135 - 145 mmol/L   Potassium 3.8 3.5 - 5.1 mmol/L   Chloride 111 98 - 111 mmol/L   CO2 29 22 - 32 mmol/L   Glucose, Bld 124 (H) 70 - 99 mg/dL   BUN 36 (H) 8 - 23 mg/dL   Creatinine, Ser 1.17 (H) 0.44 - 1.00 mg/dL   Calcium 9.8 8.9 - 10.3 mg/dL   Total Protein 5.7 (L) 6.5 - 8.1 g/dL   Albumin 2.4 (L) 3.5 - 5.0 g/dL   AST 22 15 - 41 U/L   ALT 56 (H) 0 - 44 U/L   Alkaline Phosphatase 51 38 - 126 U/L   Total Bilirubin 0.7 0.3 - 1.2 mg/dL   GFR, Estimated 49 (L) >60 mL/min   Anion gap 6 5 - 15  Phosphorus  Result Value Ref Range   Phosphorus 2.9 2.5 - 4.6 mg/dL  Blood gas, arterial  Result Value Ref Range   FIO2 40.00    Delivery systems VENTILATOR    pH, Arterial 7.43 7.35 - 7.45   pCO2 arterial 44 32 - 48 mmHg   pO2, Arterial 83 83 - 108 mmHg   Bicarbonate 29.2 (H) 20.0 - 28.0 mmol/L   Acid-Base Excess 4.3 (H) 0.0 - 2.0 mmol/L   O2  Saturation 96.5 %   Patient temperature 37.0    Collection site RIGHT RADIAL    Sample  type ARTERIAL DRAW    Allens test (pass/fail) PASS PASS  Glucose, capillary  Result Value Ref Range   Glucose-Capillary 120 (H) 70 - 99 mg/dL  Glucose, capillary  Result Value Ref Range   Glucose-Capillary 144 (H) 70 - 99 mg/dL  Glucose, capillary  Result Value Ref Range   Glucose-Capillary 143 (H) 70 - 99 mg/dL  Glucose, capillary  Result Value Ref Range   Glucose-Capillary 126 (H) 70 - 99 mg/dL  Glucose, capillary  Result Value Ref Range   Glucose-Capillary 113 (H) 70 - 99 mg/dL  Glucose, capillary  Result Value Ref Range   Glucose-Capillary 111 (H) 70 - 99 mg/dL  Glucose, capillary  Result Value Ref Range   Glucose-Capillary 118 (H) 70 - 99 mg/dL  Glucose, capillary  Result Value Ref Range   Glucose-Capillary 123 (H) 70 - 99 mg/dL  Magnesium  Result Value Ref Range   Magnesium 1.9 1.7 - 2.4 mg/dL  CBC with Differential/Platelet  Result Value Ref Range   WBC 6.4 4.0 - 10.5 K/uL   RBC 3.78 (L) 3.87 - 5.11 MIL/uL   Hemoglobin 10.5 (L) 12.0 - 15.0 g/dL   HCT 34.3 (L) 36 - 46 %   MCV 90.7 80.0 - 100.0 fL   MCH 27.8 26.0 - 34.0 pg   MCHC 30.6 30.0 - 36.0 g/dL   RDW 17.0 (H) 11.5 - 15.5 %   Platelets 144 (L) 150 - 400 K/uL   nRBC 0.0 0.0 - 0.2 %   Neutrophils Relative % 73 %   Neutro Abs 4.7 1.7 - 7.7 K/uL   Lymphocytes Relative 11 %   Lymphs Abs 0.7 0.7 - 4.0 K/uL   Monocytes Relative 7 %   Monocytes Absolute 0.4 0.1 - 1.0 K/uL   Eosinophils Relative 8 %   Eosinophils Absolute 0.5 0.0 - 0.5 K/uL   Basophils Relative 0 %   Basophils Absolute 0.0 0.0 - 0.1 K/uL   Immature Granulocytes 1 %   Abs Immature Granulocytes 0.03 0.00 - 0.07 K/uL  Comprehensive metabolic panel  Result Value Ref Range   Sodium 147 (H) 135 - 145 mmol/L   Potassium 3.2 (L) 3.5 - 5.1 mmol/L   Chloride 110 98 - 111 mmol/L   CO2 28 22 - 32 mmol/L   Glucose, Bld 99 70 - 99 mg/dL   BUN 27 (H) 8 - 23 mg/dL     Creatinine, Ser 0.93 0.44 - 1.00 mg/dL   Calcium 9.3 8.9 - 10.3 mg/dL   Total Protein 5.4 (L) 6.5 - 8.1 g/dL   Albumin 2.3 (L) 3.5 - 5.0 g/dL   AST 18 15 - 41 U/L   ALT 42 0 - 44 U/L   Alkaline Phosphatase 49 38 - 126 U/L   Total Bilirubin 0.5 0.3 - 1.2 mg/dL   GFR, Estimated >60 >60 mL/min   Anion gap 9 5 - 15  Blood gas, venous  Result Value Ref Range   FIO2 0.40    pH, Ven 7.36 7.25 - 7.43   pCO2, Ven 57 44 - 60 mmHg   pO2, Ven 45.0 32 - 45 mmHg   Bicarbonate 32.2 (H) 20.0 - 28.0 mmol/L   Acid-Base Excess 4.5 (H) 0.0 - 2.0 mmol/L   O2 Saturation 78.7 %   Patient temperature 37.0    Collection site VEIN    Sample type VENOUS   Phosphorus  Result Value Ref Range   Phosphorus 2.7 2.5 - 4.6 mg/dL   *Note: Due to a  large number of results and/or encounters for the requested time period, some results have not been displayed. A complete set of results can be found in Results Review.      Assessment & Plan:   Problem List Items Addressed This Visit      Cardiovascular and Mediastinum   Hypertension    Chronic, ongoing with elevations since discharge home.  Will restart Olmesartan at 20 MG daily, will offer kidney protection.  Recommend checking BP at home daily and documenting for provider + focus on DASH diet.  BMP and CBC on Friday to be obtained by home health RN. Return to office in 4 weeks.      Relevant Medications   apixaban (ELIQUIS) 5 MG TABS tablet   olmesartan (BENICAR) 20 MG tablet   Atrial fibrillation with RVR ([place]) - Primary    New diagnosis.  At this time will continue Amiodarone and Xarelto, she has coupon to obtain Xarelto and plans to pick up today.  Continue to collaborate with cardiology.  Plan for home health RN to obtain BMP and CBC on Friday.  Return in 4 weeks.      Relevant Medications   apixaban (ELIQUIS) 5 MG TABS tablet   olmesartan (BENICAR) 20 MG tablet   Other Relevant Orders   CBC with Differential/Platelet   Basic metabolic panel      Respiratory   Acute hypoxemic respiratory failure due to COVID-19 [facility])    Acute and improving.  She reports no SOB or cough, no fever.  Unable to perform face to face exam today.  Will plan on home health RN obtaining CBC and BMP on Friday.  Return to office in 4 weeks for follow-up.        Genitourinary   Acute kidney failure, unspecified ([place])    Recheck BMP outpatient, home health RN will obtain labs on Friday.        Other   Sacral wound    Urgent referral to wound care clinic for further recommendations.  Continue to collaborate with home health RN.  Return in 4 weeks for face to face assessment.      Relevant Orders   Ambulatory referral to Wound Clinic      I discussed the assessment and treatment plan with the patient. The patient was provided an opportunity to ask questions and all were answered. The patient agreed with the plan and demonstrated an understanding of the instructions.   The patient was advised to call back or seek an in-person evaluation if the symptoms worsen or if the condition fails to improve as anticipated.   I provided 30+ minutes of time during this encounter.  Follow up plan: Return in about 4 weeks (around 10/04/2020) for A-Fib, HTN/HLD -- COVID F/U.

## 2020-09-06 NOTE — Assessment & Plan Note (Signed)
New diagnosis.  At this time will continue Amiodarone and Xarelto, she has coupon to obtain Xarelto and plans to pick up today.  Continue to collaborate with cardiology.  Plan for home health RN to obtain BMP and CBC on Friday.  Return in 4 weeks.

## 2020-09-06 NOTE — Assessment & Plan Note (Signed)
Urgent referral to wound care clinic for further recommendations.  Continue to collaborate with home health RN.  Return in 4 weeks for face to face assessment.

## 2020-09-06 NOTE — Assessment & Plan Note (Signed)
Recheck BMP outpatient, home health RN will obtain labs on Friday.

## 2020-09-06 NOTE — Chronic Care Management (AMB) (Signed)
    Chronic Care Management Pharmacy Assistant   Name: Courtney Anderson  MRN: [id number] DOB: 11-24-1955  Reason for Encounter: Patient assistance for Xarelto   PCP : Guadalupe Maple, MD  Allergies:   Allergies  Allergen Reactions  . Codeine     Medications: Outpatient Encounter Medications as of 09/06/2020  Medication Sig  . amiodarone (PACERONE) 200 MG tablet Take 1 tablet (200 mg total) by mouth daily.  Marland Kitchen apixaban (ELIQUIS) 5 MG TABS tablet Take 5 mg by mouth 2 (two) times daily.  Marland Kitchen aspirin 81 MG tablet Take 81 mg by mouth daily.  . Cholecalciferol (VITAMIN D) 50 MCG (2000 UT) tablet Take by mouth.  . Multiple Vitamin (MULTIVITAMIN) tablet Take 1 tablet by mouth daily.  Marland Kitchen nystatin (MYCOSTATIN/NYSTOP) powder Apply topically 3 (three) times daily.  . rivaroxaban (XARELTO) 20 MG TABS tablet Take 1 tablet (20 mg total) by mouth daily with supper.   No facility-administered encounter medications on file as of 09/06/2020.    Current Diagnosis: Patient Active Problem List   Diagnosis Date Noted  . Acute respiratory failure with hypoxia ([place])   . Aspiration into airway   . Skin ulcer ([place]) 08/19/2020  . Sepsis ([place]) 07/24/2020  . Acute kidney failure, unspecified ([place]) 07/24/2020  . Acute hypoxemic respiratory failure due to COVID-19 ([place]) 07/24/2020  . Atrial fibrillation with RVR ([place]) 07/24/2020  . Hypokalemia 07/24/2020  . Vitamin D deficiency 10/17/2019  . 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 Hypercalcemia 08/20/2018  . Hyperthyroidism 08/20/2018  . Multinodular goiter 08/20/2018  . Tachycardia 08/29/2017  . Hypertension 05/31/2015  . Morbid obesity ([place]) 05/31/2015  . Elevated transaminase level 05/31/2015  . Hyperlipidemia 05/31/2015    Goals Addressed   None    Patient assistance application for Xarelto has been started for patient with completion of demographic information, prescription and provider information. Attempted to make patient aware  that she will need to come by the PCP office for signature on application. No answer. Left HIPAA compliant voicemail requesting a call back. Pending approval or denial status at this time.   [name], LPN Clinical Pharmacist Assistant  [phone]    Follow-Up:  Patient Assistance Coordination

## 2020-09-06 NOTE — Patient Instructions (Signed)
COVID-19 COVID-19 is a respiratory infection that is caused by a virus called severe acute respiratory syndrome coronavirus 2 (SARS-CoV-2). The disease is also known as coronavirus disease or novel coronavirus. In some people, the virus may not cause any symptoms. In others, it may cause a serious infection. The infection can get worse quickly and can lead to complications, such as:  Pneumonia, or infection of the lungs.  Acute respiratory distress syndrome or ARDS. This is a condition in which fluid build-up in the lungs prevents the lungs from filling with air and passing oxygen into the blood.  Acute respiratory failure. This is a condition in which there is not enough oxygen passing from the lungs to the body or when carbon dioxide is not passing from the lungs out of the body.  Sepsis or septic shock. This is a serious bodily reaction to an infection.  Blood clotting problems.  Secondary infections due to bacteria or fungus.  Organ failure. This is when your body's organs stop working. The virus that causes COVID-19 is contagious. This means that it can spread from person to person through droplets from coughs and sneezes (respiratory secretions). What are the causes? This illness is caused by a virus. You may catch the virus by:  Breathing in droplets from an infected person. Droplets can be spread by a person breathing, speaking, singing, coughing, or sneezing.  Touching something, like a table or a doorknob, that was exposed to the virus (contaminated) and then touching your mouth, nose, or eyes. What increases the risk? Risk for infection You are more likely to be infected with this virus if you:  Are within 6 feet (2 meters) of a person with COVID-19.  Provide care for or live with a person who is infected with COVID-19.  Spend time in crowded indoor spaces or live in shared housing. Risk for serious illness You are more likely to become seriously ill from the virus if you:   Are 50 years of age or older. The higher your age, the more you are at risk for serious illness.  Live in a nursing home or long-term care facility.  Have cancer.  Have a long-term (chronic) disease such as: ? Chronic lung disease, including chronic obstructive pulmonary disease or asthma. ? A long-term disease that lowers your body's ability to fight infection (immunocompromised). ? Heart disease, including heart failure, a condition in which the arteries that lead to the heart become narrow or blocked (coronary artery disease), a disease which makes the heart muscle thick, weak, or stiff (cardiomyopathy). ? Diabetes. ? Chronic kidney disease. ? Sickle cell disease, a condition in which red blood cells have an abnormal "sickle" shape. ? Liver disease.  Are obese. What are the signs or symptoms? Symptoms of this condition can range from mild to severe. Symptoms may appear any time from 2 to 14 days after being exposed to the virus. They include:  A fever or chills.  A cough.  Difficulty breathing.  Headaches, body aches, or muscle aches.  Runny or stuffy (congested) nose.  A sore throat.  New loss of taste or smell. Some people may also have stomach problems, such as nausea, vomiting, or diarrhea. Other people may not have any symptoms of COVID-19. How is this diagnosed? This condition may be diagnosed based on:  Your signs and symptoms, especially if: ? You live in an area with a COVID-19 outbreak. ? You recently traveled to or from an area where the virus is common. ? You   provide care for or live with a person who was diagnosed with COVID-19. ? You were exposed to a person who was diagnosed with COVID-19.  A physical exam.  Lab tests, which may include: ? Taking a sample of fluid from the back of your nose and throat (nasopharyngeal fluid), your nose, or your throat using a swab. ? A sample of mucus from your lungs (sputum). ? Blood tests.  Imaging tests, which  may include, X-rays, CT scan, or ultrasound. How is this treated? At present, there is no medicine to treat COVID-19. Medicines that treat other diseases are being used on a trial basis to see if they are effective against COVID-19. Your health care provider will talk with you about ways to treat your symptoms. For most people, the infection is mild and can be managed at home with rest, fluids, and over-the-counter medicines. Treatment for a serious infection usually takes places in a hospital intensive care unit (ICU). It may include one or more of the following treatments. These treatments are given until your symptoms improve.  Receiving fluids and medicines through an IV.  Supplemental oxygen. Extra oxygen is given through a tube in the nose, a face mask, or a hood.  Positioning you to lie on your stomach (prone position). This makes it easier for oxygen to get into the lungs.  Continuous positive airway pressure (CPAP) or bi-level positive airway pressure (BPAP) machine. This treatment uses mild air pressure to keep the airways open. A tube that is connected to a motor delivers oxygen to the body.  Ventilator. This treatment moves air into and out of the lungs by using a tube that is placed in your windpipe.  Tracheostomy. This is a procedure to create a hole in the neck so that a breathing tube can be inserted.  Extracorporeal membrane oxygenation (ECMO). This procedure gives the lungs a chance to recover by taking over the functions of the heart and lungs. It supplies oxygen to the body and removes carbon dioxide. Follow these instructions at home: Lifestyle  If you are sick, stay home except to get medical care. Your health care provider will tell you how long to stay home. Call your health care provider before you go for medical care.  Rest at home as told by your health care provider.  Do not use any products that contain nicotine or tobacco, such as cigarettes, e-cigarettes, and  chewing tobacco. If you need help quitting, ask your health care provider.  Return to your normal activities as told by your health care provider. Ask your health care provider what activities are safe for you. General instructions  Take over-the-counter and prescription medicines only as told by your health care provider.  Drink enough fluid to keep your urine pale yellow.  Keep all follow-up visits as told by your health care provider. This is important. How is this prevented?  There is no vaccine to help prevent COVID-19 infection. However, there are steps you can take to protect yourself and others from this virus. To protect yourself:   Do not travel to areas where COVID-19 is a risk. The areas where COVID-19 is reported change often. To identify high-risk areas and travel restrictions, check the CDC travel website: wwwnc.cdc.gov/travel/notices  If you live in, or must travel to, an area where COVID-19 is a risk, take precautions to avoid infection. ? Stay away from people who are sick. ? Wash your hands often with soap and water for 20 seconds. If soap and water   are not available, use an alcohol-based hand sanitizer. ? Avoid touching your mouth, face, eyes, or nose. ? Avoid going out in public, follow guidance from your state and local health authorities. ? If you must go out in public, wear a cloth face covering or face mask. Make sure your mask covers your nose and mouth. ? Avoid crowded indoor spaces. Stay at least 6 feet (2 meters) away from others. ? Disinfect objects and surfaces that are frequently touched every day. This may include:  Counters and tables.  Doorknobs and light switches.  Sinks and faucets.  Electronics, such as phones, remote controls, keyboards, computers, and tablets. To protect others: If you have symptoms of COVID-19, take steps to prevent the virus from spreading to others.  If you think you have a COVID-19 infection, contact your health care  provider right away. Tell your health care team that you think you may have a COVID-19 infection.  Stay home. Leave your house only to seek medical care. Do not use public transport.  Do not travel while you are sick.  Wash your hands often with soap and water for 20 seconds. If soap and water are not available, use alcohol-based hand sanitizer.  Stay away from other members of your household. Let healthy household members care for children and pets, if possible. If you have to care for children or pets, wash your hands often and wear a mask. If possible, stay in your own room, separate from others. Use a different bathroom.  Make sure that all people in your household wash their hands well and often.  Cough or sneeze into a tissue or your sleeve or elbow. Do not cough or sneeze into your hand or into the air.  Wear a cloth face covering or face mask. Make sure your mask covers your nose and mouth. Where to find more information  Centers for Disease Control and Prevention: www.cdc.gov/coronavirus/2019-ncov/index.html  World Health Organization: www.who.int/health-topics/coronavirus Contact a health care provider if:  You live in or have traveled to an area where COVID-19 is a risk and you have symptoms of the infection.  You have had contact with someone who has COVID-19 and you have symptoms of the infection. Get help right away if:  You have trouble breathing.  You have pain or pressure in your chest.  You have confusion.  You have bluish lips and fingernails.  You have difficulty waking from sleep.  You have symptoms that get worse. These symptoms may represent a serious problem that is an emergency. Do not wait to see if the symptoms will go away. Get medical help right away. Call your local emergency services (911 in the [place] Do not drive yourself to the hospital. Let the emergency medical personnel know if you think you have COVID-19. Summary  COVID-19 is a  respiratory infection that is caused by a virus. It is also known as coronavirus disease or novel coronavirus. It can cause serious infections, such as pneumonia, acute respiratory distress syndrome, acute respiratory failure, or sepsis.  The virus that causes COVID-19 is contagious. This means that it can spread from person to person through droplets from breathing, speaking, singing, coughing, or sneezing.  You are more likely to develop a serious illness if you are 50 years of age or older, have a weak immune system, live in a nursing home, or have chronic disease.  There is no medicine to treat COVID-19. Your health care provider will talk with you about ways to treat your symptoms.    Take steps to protect yourself and others from infection. Wash your hands often and disinfect objects and surfaces that are frequently touched every day. Stay away from people who are sick and wear a mask if you are sick. This information is not intended to replace advice given to you by your health care provider. Make sure you discuss any questions you have with your health care provider. Document Revised: 08/20/2019 Document Reviewed: 11/26/2018 Elsevier Patient Education  2020 [employer] Inc.  

## 2020-09-06 NOTE — Telephone Encounter (Signed)
Called pt to schedule 4 week f/u in office per [facility], pt states that she will have to call back to schedule due to starting therapy tomorrow and not knowing her schedule yet

## 2020-09-07 ENCOUNTER — Other Ambulatory Visit: Payer: Self-pay | Admitting: Family Medicine

## 2020-09-07 ENCOUNTER — Telehealth: Payer: Self-pay | Admitting: General Practice

## 2020-09-07 MED ORDER — OLMESARTAN MEDOXOMIL 20 MG PO TABS
20.0000 mg | ORAL_TABLET | Freq: Every day | ORAL | 0 refills | Status: DC
Start: 2020-09-07 — End: 2020-10-30

## 2020-09-07 NOTE — Telephone Encounter (Signed)
OK for verbal orders?

## 2020-09-07 NOTE — Telephone Encounter (Signed)
Pt asked the dr to send her olmesartan (BENICAR) 20 MG tablet  To  [place]), [address] Phone:  [phone]  Fax:  [phone]     It was sent to [facility].  Can you resend to the correct? Pt states the pharmacy told her they did not have time.

## 2020-09-07 NOTE — Telephone Encounter (Signed)
Please Advise.  KP

## 2020-09-07 NOTE — Telephone Encounter (Signed)
Called Chris left VM giving verbal orders.   KP

## 2020-09-07 NOTE — Telephone Encounter (Signed)
Chris with [facility] is calling in to request verbal orders for PT for pt.    Frequency: 2 week 7   CB: [phone]

## 2020-09-08 ENCOUNTER — Telehealth: Payer: Self-pay

## 2020-09-08 NOTE — Chronic Care Management (AMB) (Signed)
  Chronic Care Management   Note  09/08/2020 Name: Courtney Anderson MRN: [id number] DOB: 07/22/1956  Courtney Anderson is a 64 y.o. year old female who is a primary care patient of Crissman, Jeannette How, MD. I reached out to Courtney Anderson by phone today in response to a referral sent by Ms. Leone Payor Hanser's PCP, Guadalupe Maple, MD.     Ms. Pangborn was given information about Chronic Care Management services today including:  1. CCM service includes personalized support from designated clinical staff supervised by her physician, including individualized plan of care and coordination with other care providers 2. 24/7 contact phone numbers for assistance for urgent and routine care needs. 3. Service will only be billed when office clinical staff spend 20 minutes or more in a month to coordinate care. 4. Only one practitioner may furnish and bill the service in a calendar month. 5. The patient may stop CCM services at any time (effective at the end of the month) by phone call to the office staff. 6. The patient will be responsible for cost sharing (co-pay) of up to 20% of the service fee (after annual deductible is met).  Patient agreed to services and verbal consent obtained.   Follow up plan: Telephone appointment with care management team member scheduled for:09/08/2020  [facility]

## 2020-09-12 ENCOUNTER — Telehealth: Payer: Self-pay

## 2020-09-12 NOTE — Telephone Encounter (Signed)
Please contact patient and alert her.  She needs to be seen ASAP.  I would like her to go to [facility] then.  I have edited referral to note this.  Thank you.

## 2020-09-12 NOTE — Telephone Encounter (Signed)
Courtney Anderson can you see if there is anyone else that may be able to see her sooner than this nearby, including [facility] or [facility].  She needs sooner appointment I believe based on home care RN report of wound.

## 2020-09-12 NOTE — Telephone Encounter (Signed)
Copied from [place] [id number]. Topic: General - Inquiry >> Sep 12, 2020 11:04 AM Lennox Solders wrote: Reason for CRM: [facility] is calling to let jolene know they are backed up . The earliest appt is 11-28-2020

## 2020-09-12 NOTE — Telephone Encounter (Signed)
Called around for wound care. The soonest I can find is with [facility].They stated that can get patient in by the end of November. Please let me know if I need to do something else.

## 2020-09-12 NOTE — Addendum Note (Signed)
Addended by: Marnee Guarneri T on: 09/12/2020 12:22 PM   Modules accepted: Orders

## 2020-09-29 ENCOUNTER — Other Ambulatory Visit: Payer: Self-pay | Admitting: Family Medicine

## 2020-09-29 NOTE — Telephone Encounter (Signed)
Medication Refill - Medication: Pacerone 200 mg  Has the patient contacted their pharmacy? No. (Agent: If no, request that the patient contact the pharmacy for the refill.) (Agent: If yes, when and what did the pharmacy advise?)  Preferred Pharmacy (with phone number or street name): [facility] [address] [place]  Agent: Please be advised that RX refills may take up to 3 business days. We ask that you follow-up with your pharmacy.

## 2020-09-29 NOTE — Telephone Encounter (Signed)
Notes to clinic:  medication was filled by a different provider Review for refill    Requested Prescriptions  Pending Prescriptions Disp Refills   amiodarone (PACERONE) 200 MG tablet 30 tablet 0    Sig: Take 1 tablet (200 mg total) by mouth daily.      Not Delegated - Cardiovascular: Antiarrhythmic Agents - amiodarone Failed - 09/29/2020  9:47 AM      Failed - This refill cannot be delegated      Failed - Last BP in normal range    BP Readings from Last 1 Encounters:  09/06/20 (!) 146/90          Passed - TSH in normal range and within 180 days    TSH  Date Value Ref Range Status  07/28/2020 3.558 0.350 - 4.500 uIU/mL Final    Comment:    Performed by a 3rd Generation assay with a functional sensitivity of <=0.01 uIU/mL. Performed at [facility], [address] [place], [place] [address]   07/24/2020 2.850 0.450 - 4.500 uIU/mL Final          Passed - Mg Level in normal range and within 180 days    Magnesium  Date Value Ref Range Status  08/28/2020 2.1 1.7 - 2.4 mg/dL Final    Comment:    Performed at [facility], [address], [place], [place] [address]          Passed - K in normal range and within 180 days    Potassium  Date Value Ref Range Status  08/28/2020 4.0 3.5 - 5.1 mmol/L Final    Comment:    Performed at [facility], [address] [place], [place] [address]          Passed - Ca in normal range and within 180 days    Calcium  Date Value Ref Range Status  08/27/2020 9.9 8.9 - 10.3 mg/dL Final   Calcium, Total (PTH)  Date Value Ref Range Status  07/24/2020 10.1 8.7 - 10.3 mg/dL Final          Passed - AST in normal range and within 180 days    AST  Date Value Ref Range Status  08/25/2020 23 15 - 41 U/L Final          Passed - ALT in normal range and within 180 days    ALT  Date Value Ref Range Status  08/25/2020 18 0 - 44 U/L Final          Passed - Patient had ECG in the last 180 days       Passed - Last Heart Rate in normal range    Pulse Readings from Last 1 Encounters:  09/06/20 67          Passed - Valid encounter within last 6 months    Recent Outpatient Visits           3 weeks ago Atrial fibrillation with RVR ([place])   [place], [place] T, NP   11 months ago Morbid obesity ([place])   [place], Marvel T, NP   1 year ago Essential hypertension   [place], Jeannette How, MD   2 years ago Essential hypertension   [place], Jeannette How, MD   3 years ago Needs flu shot   [facility] Crissman, Jeannette How, MD

## 2020-10-02 ENCOUNTER — Other Ambulatory Visit: Payer: Self-pay | Admitting: Nurse Practitioner

## 2020-10-02 ENCOUNTER — Telehealth: Payer: Self-pay

## 2020-10-02 MED ORDER — RIVAROXABAN 20 MG PO TABS
20.0000 mg | ORAL_TABLET | Freq: Every day | ORAL | 5 refills | Status: DC
Start: 2020-10-02 — End: 2020-10-30

## 2020-10-02 MED ORDER — AMIODARONE HCL 200 MG PO TABS
200.0000 mg | ORAL_TABLET | Freq: Every day | ORAL | 4 refills | Status: DC
Start: 2020-10-02 — End: 2021-02-02

## 2020-10-02 NOTE — Telephone Encounter (Signed)
Can you refill this?

## 2020-10-02 NOTE — Telephone Encounter (Signed)
Pt wanted to advise provider that she applied for medicare advantage and it will help with cost of Rx and will kick in December / Pt wanted to use this for getting the refill on her blood thinner /

## 2020-10-02 NOTE — Telephone Encounter (Signed)
Please advise pt seen 11/3    Copied from [place] [phone]. Topic: General - Inquiry >> Oct 02, 2020 10:21 AM Greggory Keen D wrote: Reason for CRM: Pt called  Asking if she was still supposed to be taking the Pacerone 200 mg that she was prescribed while in the hospital  CB#  [phone]

## 2020-10-02 NOTE — Telephone Encounter (Signed)
Called pt advised of Jolene's message. Pt states that she does plan to f/u with cardiology but right now she is unable to walk by herself. Pt also states that she has 6 pills of her xalreto left and she is unable to get refill due to the cost wants to know if there is something we can do to help. Please advise.

## 2020-10-02 NOTE — Telephone Encounter (Signed)
Have sent in refills Xarelto and will continue to work with CCM PharmD on this with patient.  Recommend she refill when possible as is important medication for stroke prevention with her atrial fibrillation.  She could see if cardiology is doing virtual visits and plan follow-up with them that way for now.

## 2020-10-02 NOTE — Telephone Encounter (Signed)
At this time should continue due to atrial fibrillation, but does need to schedule to see cardiology.  I have sent in refills on this.

## 2020-10-04 ENCOUNTER — Telehealth: Payer: Self-pay | Admitting: Nurse Practitioner

## 2020-10-04 NOTE — Telephone Encounter (Signed)
Verbal order given  

## 2020-10-04 NOTE — Telephone Encounter (Signed)
Please call and provider verbal orders to home health noted below.  Am okay with verbal orders.

## 2020-10-04 NOTE — Telephone Encounter (Signed)
Copied from [place] [phone]. Topic: Quick Communication - Home Health Verbal Orders >> Oct 04, 2020 11:31 AM Leward Quan A wrote: Caller/Agency: Jonelle Sidle // [facility]  Callback Number: [phone] opt 2 // ok to leave message  Requesting OT/PT/Skilled Nursing/Social Work/Speech Therapy: PT start up care for 10/05/20 Frequency: start up care 10/05/20

## 2020-10-05 ENCOUNTER — Telehealth: Payer: Self-pay | Admitting: Nurse Practitioner

## 2020-10-05 DIAGNOSIS — U071 COVID-19: Secondary | ICD-10-CM | POA: Diagnosis not present

## 2020-10-05 DIAGNOSIS — E785 Hyperlipidemia, unspecified: Secondary | ICD-10-CM | POA: Diagnosis not present

## 2020-10-05 DIAGNOSIS — A419 Sepsis, unspecified organism: Secondary | ICD-10-CM | POA: Diagnosis not present

## 2020-10-05 DIAGNOSIS — N179 Acute kidney failure, unspecified: Secondary | ICD-10-CM | POA: Diagnosis not present

## 2020-10-05 DIAGNOSIS — I480 Paroxysmal atrial fibrillation: Secondary | ICD-10-CM | POA: Diagnosis not present

## 2020-10-05 DIAGNOSIS — J1282 Pneumonia due to coronavirus disease 2019: Secondary | ICD-10-CM | POA: Diagnosis not present

## 2020-10-05 DIAGNOSIS — I493 Ventricular premature depolarization: Secondary | ICD-10-CM | POA: Diagnosis not present

## 2020-10-05 DIAGNOSIS — E039 Hypothyroidism, unspecified: Secondary | ICD-10-CM | POA: Diagnosis not present

## 2020-10-05 DIAGNOSIS — I452 Bifascicular block: Secondary | ICD-10-CM | POA: Diagnosis not present

## 2020-10-05 DIAGNOSIS — E0590 Thyrotoxicosis, unspecified without thyrotoxic crisis or storm: Secondary | ICD-10-CM | POA: Diagnosis not present

## 2020-10-05 DIAGNOSIS — M6281 Muscle weakness (generalized): Secondary | ICD-10-CM | POA: Diagnosis not present

## 2020-10-05 DIAGNOSIS — L89152 Pressure ulcer of sacral region, stage 2: Secondary | ICD-10-CM | POA: Diagnosis not present

## 2020-10-05 DIAGNOSIS — J9601 Acute respiratory failure with hypoxia: Secondary | ICD-10-CM | POA: Diagnosis not present

## 2020-10-05 NOTE — Telephone Encounter (Signed)
Home Health Verbal Orders - Caller/Agency: chris  [facility] Number: [phone]  Requesting OT/PT/Skilled Nursing/Social Work/Speech Therapy: ok for PT to continue 2 times a week for 8 weeks

## 2020-10-06 DIAGNOSIS — L89152 Pressure ulcer of sacral region, stage 2: Secondary | ICD-10-CM | POA: Diagnosis not present

## 2020-10-06 DIAGNOSIS — I480 Paroxysmal atrial fibrillation: Secondary | ICD-10-CM | POA: Diagnosis not present

## 2020-10-06 DIAGNOSIS — I452 Bifascicular block: Secondary | ICD-10-CM | POA: Diagnosis not present

## 2020-10-06 DIAGNOSIS — J1282 Pneumonia due to coronavirus disease 2019: Secondary | ICD-10-CM | POA: Diagnosis not present

## 2020-10-06 DIAGNOSIS — J9601 Acute respiratory failure with hypoxia: Secondary | ICD-10-CM | POA: Diagnosis not present

## 2020-10-06 DIAGNOSIS — E039 Hypothyroidism, unspecified: Secondary | ICD-10-CM | POA: Diagnosis not present

## 2020-10-06 DIAGNOSIS — I493 Ventricular premature depolarization: Secondary | ICD-10-CM | POA: Diagnosis not present

## 2020-10-06 DIAGNOSIS — A419 Sepsis, unspecified organism: Secondary | ICD-10-CM | POA: Diagnosis not present

## 2020-10-06 DIAGNOSIS — E0590 Thyrotoxicosis, unspecified without thyrotoxic crisis or storm: Secondary | ICD-10-CM | POA: Diagnosis not present

## 2020-10-06 DIAGNOSIS — M6281 Muscle weakness (generalized): Secondary | ICD-10-CM | POA: Diagnosis not present

## 2020-10-06 DIAGNOSIS — E785 Hyperlipidemia, unspecified: Secondary | ICD-10-CM | POA: Diagnosis not present

## 2020-10-06 DIAGNOSIS — N179 Acute kidney failure, unspecified: Secondary | ICD-10-CM | POA: Diagnosis not present

## 2020-10-06 DIAGNOSIS — U071 COVID-19: Secondary | ICD-10-CM | POA: Diagnosis not present

## 2020-10-06 NOTE — Telephone Encounter (Signed)
Ok for verbal 

## 2020-10-06 NOTE — Telephone Encounter (Signed)
Yes, please provide verbal order

## 2020-10-06 NOTE — Telephone Encounter (Signed)
Verbal order given  

## 2020-10-09 NOTE — Telephone Encounter (Signed)
Patient portion of patient assistance application mailed to home address today.

## 2020-10-10 ENCOUNTER — Telehealth: Payer: Self-pay

## 2020-10-10 DIAGNOSIS — I493 Ventricular premature depolarization: Secondary | ICD-10-CM | POA: Diagnosis not present

## 2020-10-10 DIAGNOSIS — N179 Acute kidney failure, unspecified: Secondary | ICD-10-CM | POA: Diagnosis not present

## 2020-10-10 DIAGNOSIS — M6281 Muscle weakness (generalized): Secondary | ICD-10-CM | POA: Diagnosis not present

## 2020-10-10 DIAGNOSIS — U071 COVID-19: Secondary | ICD-10-CM | POA: Diagnosis not present

## 2020-10-10 DIAGNOSIS — A419 Sepsis, unspecified organism: Secondary | ICD-10-CM | POA: Diagnosis not present

## 2020-10-10 DIAGNOSIS — E785 Hyperlipidemia, unspecified: Secondary | ICD-10-CM | POA: Diagnosis not present

## 2020-10-10 DIAGNOSIS — E039 Hypothyroidism, unspecified: Secondary | ICD-10-CM | POA: Diagnosis not present

## 2020-10-10 DIAGNOSIS — E0590 Thyrotoxicosis, unspecified without thyrotoxic crisis or storm: Secondary | ICD-10-CM | POA: Diagnosis not present

## 2020-10-10 DIAGNOSIS — J1282 Pneumonia due to coronavirus disease 2019: Secondary | ICD-10-CM | POA: Diagnosis not present

## 2020-10-10 DIAGNOSIS — I480 Paroxysmal atrial fibrillation: Secondary | ICD-10-CM | POA: Diagnosis not present

## 2020-10-10 DIAGNOSIS — I452 Bifascicular block: Secondary | ICD-10-CM | POA: Diagnosis not present

## 2020-10-10 DIAGNOSIS — J9601 Acute respiratory failure with hypoxia: Secondary | ICD-10-CM | POA: Diagnosis not present

## 2020-10-10 DIAGNOSIS — L89152 Pressure ulcer of sacral region, stage 2: Secondary | ICD-10-CM | POA: Diagnosis not present

## 2020-10-10 NOTE — Telephone Encounter (Signed)
Copied from [place] [phone]. Topic: Quick Communication - See Telephone Encounter >> Oct 10, 2020  4:01 PM Loma Boston wrote: CRM for notification. See Telephone encounter for: 10/10/20.CB [phone] Home health, [facility], Amy, verifying that she is going to see pt 1 time a wk for nxt 4 wks for nurse visits, please leave approval of this at above call back #

## 2020-10-10 NOTE — Telephone Encounter (Signed)
Routing to provider  

## 2020-10-10 NOTE — Telephone Encounter (Signed)
Spoke to Amy and provided verbal

## 2020-10-11 DIAGNOSIS — M6281 Muscle weakness (generalized): Secondary | ICD-10-CM | POA: Diagnosis not present

## 2020-10-11 DIAGNOSIS — L89152 Pressure ulcer of sacral region, stage 2: Secondary | ICD-10-CM | POA: Diagnosis not present

## 2020-10-11 DIAGNOSIS — U071 COVID-19: Secondary | ICD-10-CM | POA: Diagnosis not present

## 2020-10-11 DIAGNOSIS — N179 Acute kidney failure, unspecified: Secondary | ICD-10-CM | POA: Diagnosis not present

## 2020-10-11 DIAGNOSIS — E785 Hyperlipidemia, unspecified: Secondary | ICD-10-CM | POA: Diagnosis not present

## 2020-10-11 DIAGNOSIS — I452 Bifascicular block: Secondary | ICD-10-CM | POA: Diagnosis not present

## 2020-10-11 DIAGNOSIS — I493 Ventricular premature depolarization: Secondary | ICD-10-CM | POA: Diagnosis not present

## 2020-10-11 DIAGNOSIS — E039 Hypothyroidism, unspecified: Secondary | ICD-10-CM | POA: Diagnosis not present

## 2020-10-11 DIAGNOSIS — J1282 Pneumonia due to coronavirus disease 2019: Secondary | ICD-10-CM | POA: Diagnosis not present

## 2020-10-11 DIAGNOSIS — I480 Paroxysmal atrial fibrillation: Secondary | ICD-10-CM | POA: Diagnosis not present

## 2020-10-11 DIAGNOSIS — E0590 Thyrotoxicosis, unspecified without thyrotoxic crisis or storm: Secondary | ICD-10-CM | POA: Diagnosis not present

## 2020-10-11 DIAGNOSIS — A419 Sepsis, unspecified organism: Secondary | ICD-10-CM | POA: Diagnosis not present

## 2020-10-11 DIAGNOSIS — J9601 Acute respiratory failure with hypoxia: Secondary | ICD-10-CM | POA: Diagnosis not present

## 2020-10-18 ENCOUNTER — Telehealth: Payer: Self-pay

## 2020-10-18 DIAGNOSIS — I452 Bifascicular block: Secondary | ICD-10-CM | POA: Diagnosis not present

## 2020-10-18 DIAGNOSIS — I480 Paroxysmal atrial fibrillation: Secondary | ICD-10-CM | POA: Diagnosis not present

## 2020-10-18 DIAGNOSIS — E0590 Thyrotoxicosis, unspecified without thyrotoxic crisis or storm: Secondary | ICD-10-CM | POA: Diagnosis not present

## 2020-10-18 DIAGNOSIS — N179 Acute kidney failure, unspecified: Secondary | ICD-10-CM | POA: Diagnosis not present

## 2020-10-18 DIAGNOSIS — I493 Ventricular premature depolarization: Secondary | ICD-10-CM | POA: Diagnosis not present

## 2020-10-18 DIAGNOSIS — E039 Hypothyroidism, unspecified: Secondary | ICD-10-CM | POA: Diagnosis not present

## 2020-10-18 DIAGNOSIS — E785 Hyperlipidemia, unspecified: Secondary | ICD-10-CM | POA: Diagnosis not present

## 2020-10-18 DIAGNOSIS — L89152 Pressure ulcer of sacral region, stage 2: Secondary | ICD-10-CM | POA: Diagnosis not present

## 2020-10-18 DIAGNOSIS — J9601 Acute respiratory failure with hypoxia: Secondary | ICD-10-CM | POA: Diagnosis not present

## 2020-10-18 DIAGNOSIS — J1282 Pneumonia due to coronavirus disease 2019: Secondary | ICD-10-CM | POA: Diagnosis not present

## 2020-10-18 DIAGNOSIS — M6281 Muscle weakness (generalized): Secondary | ICD-10-CM | POA: Diagnosis not present

## 2020-10-18 DIAGNOSIS — U071 COVID-19: Secondary | ICD-10-CM | POA: Diagnosis not present

## 2020-10-18 DIAGNOSIS — A419 Sepsis, unspecified organism: Secondary | ICD-10-CM | POA: Diagnosis not present

## 2020-10-18 NOTE — Telephone Encounter (Signed)
Yes, will need visit please.

## 2020-10-18 NOTE — Telephone Encounter (Signed)
Noted  

## 2020-10-18 NOTE — Telephone Encounter (Signed)
Called pt scheduled for 12/22

## 2020-10-18 NOTE — Telephone Encounter (Signed)
Called pt to schedule appt no answer left vm pt last seen 11/3  Copied from [place] [phone]. Topic: General - Call Back - No Documentation >> Oct 18, 2020  1:21 PM Erick Blinks wrote: Reason for CRM: Pt declined appt, she has a cough that wont go away ever since having covid in September. She is requesting medication to relieve her cough that is still lingering.   Best contact: [facility] [address]), [place] [address] [place] ([place]) [place] [address] Phone: [phone] Fax: [phone]

## 2020-10-19 ENCOUNTER — Telehealth: Payer: Self-pay

## 2020-10-19 DIAGNOSIS — E785 Hyperlipidemia, unspecified: Secondary | ICD-10-CM | POA: Diagnosis not present

## 2020-10-19 DIAGNOSIS — A419 Sepsis, unspecified organism: Secondary | ICD-10-CM | POA: Diagnosis not present

## 2020-10-19 DIAGNOSIS — J1282 Pneumonia due to coronavirus disease 2019: Secondary | ICD-10-CM | POA: Diagnosis not present

## 2020-10-19 DIAGNOSIS — E0590 Thyrotoxicosis, unspecified without thyrotoxic crisis or storm: Secondary | ICD-10-CM | POA: Diagnosis not present

## 2020-10-19 DIAGNOSIS — I480 Paroxysmal atrial fibrillation: Secondary | ICD-10-CM | POA: Diagnosis not present

## 2020-10-19 DIAGNOSIS — I493 Ventricular premature depolarization: Secondary | ICD-10-CM | POA: Diagnosis not present

## 2020-10-19 DIAGNOSIS — U071 COVID-19: Secondary | ICD-10-CM | POA: Diagnosis not present

## 2020-10-19 DIAGNOSIS — E039 Hypothyroidism, unspecified: Secondary | ICD-10-CM | POA: Diagnosis not present

## 2020-10-19 DIAGNOSIS — L89152 Pressure ulcer of sacral region, stage 2: Secondary | ICD-10-CM | POA: Diagnosis not present

## 2020-10-19 DIAGNOSIS — I452 Bifascicular block: Secondary | ICD-10-CM | POA: Diagnosis not present

## 2020-10-19 DIAGNOSIS — J9601 Acute respiratory failure with hypoxia: Secondary | ICD-10-CM | POA: Diagnosis not present

## 2020-10-19 DIAGNOSIS — M6281 Muscle weakness (generalized): Secondary | ICD-10-CM | POA: Diagnosis not present

## 2020-10-19 DIAGNOSIS — N179 Acute kidney failure, unspecified: Secondary | ICD-10-CM | POA: Diagnosis not present

## 2020-10-19 NOTE — Telephone Encounter (Signed)
Paula notified, she states that the nurse is going to check on her at 39 and that they will go from there.

## 2020-10-19 NOTE — Telephone Encounter (Signed)
Copied from [place] [phone]. Topic: Quick Communication - See Telephone Encounter >> Oct 19, 2020  8:58 AM Loma Boston wrote: CRM for notification. See Telephone encounter for: 10/19/20. Paula with [facility] is calling Pt is experiencing orthostatic BP levels this am. Initial 118/62 standing 100/50. Oxygen levels dropping to 81% Physical Therapy was making a later visit this am and they have been notified to re check. Paula Cb # for contact is [phone]

## 2020-10-19 NOTE — Telephone Encounter (Signed)
Please alert home health if ongoing low BP and desats due to her history with Covid and overall health I would like her to go to ER.  She has not been in for face to face visit since discharge, has been virtual only per her request and if abnormal findings today with symptoms would benefit ER visit for assessment.

## 2020-10-24 DIAGNOSIS — E785 Hyperlipidemia, unspecified: Secondary | ICD-10-CM | POA: Diagnosis not present

## 2020-10-24 DIAGNOSIS — J1282 Pneumonia due to coronavirus disease 2019: Secondary | ICD-10-CM | POA: Diagnosis not present

## 2020-10-24 DIAGNOSIS — U071 COVID-19: Secondary | ICD-10-CM | POA: Diagnosis not present

## 2020-10-24 DIAGNOSIS — L89152 Pressure ulcer of sacral region, stage 2: Secondary | ICD-10-CM | POA: Diagnosis not present

## 2020-10-24 DIAGNOSIS — A419 Sepsis, unspecified organism: Secondary | ICD-10-CM | POA: Diagnosis not present

## 2020-10-24 DIAGNOSIS — E0590 Thyrotoxicosis, unspecified without thyrotoxic crisis or storm: Secondary | ICD-10-CM | POA: Diagnosis not present

## 2020-10-24 DIAGNOSIS — M6281 Muscle weakness (generalized): Secondary | ICD-10-CM | POA: Diagnosis not present

## 2020-10-24 DIAGNOSIS — N179 Acute kidney failure, unspecified: Secondary | ICD-10-CM | POA: Diagnosis not present

## 2020-10-24 DIAGNOSIS — I480 Paroxysmal atrial fibrillation: Secondary | ICD-10-CM | POA: Diagnosis not present

## 2020-10-24 DIAGNOSIS — E039 Hypothyroidism, unspecified: Secondary | ICD-10-CM | POA: Diagnosis not present

## 2020-10-24 DIAGNOSIS — I452 Bifascicular block: Secondary | ICD-10-CM | POA: Diagnosis not present

## 2020-10-24 DIAGNOSIS — I493 Ventricular premature depolarization: Secondary | ICD-10-CM | POA: Diagnosis not present

## 2020-10-24 DIAGNOSIS — J9601 Acute respiratory failure with hypoxia: Secondary | ICD-10-CM | POA: Diagnosis not present

## 2020-10-25 ENCOUNTER — Encounter: Payer: Self-pay | Admitting: Nurse Practitioner

## 2020-10-25 ENCOUNTER — Telehealth (INDEPENDENT_AMBULATORY_CARE_PROVIDER_SITE_OTHER): Payer: Medicare Other | Admitting: Nurse Practitioner

## 2020-10-25 ENCOUNTER — Telehealth: Payer: Self-pay

## 2020-10-25 DIAGNOSIS — U071 COVID-19: Secondary | ICD-10-CM | POA: Diagnosis not present

## 2020-10-25 DIAGNOSIS — I480 Paroxysmal atrial fibrillation: Secondary | ICD-10-CM | POA: Diagnosis not present

## 2020-10-25 DIAGNOSIS — E0590 Thyrotoxicosis, unspecified without thyrotoxic crisis or storm: Secondary | ICD-10-CM | POA: Diagnosis not present

## 2020-10-25 DIAGNOSIS — E039 Hypothyroidism, unspecified: Secondary | ICD-10-CM | POA: Diagnosis not present

## 2020-10-25 DIAGNOSIS — J9601 Acute respiratory failure with hypoxia: Secondary | ICD-10-CM

## 2020-10-25 DIAGNOSIS — I493 Ventricular premature depolarization: Secondary | ICD-10-CM | POA: Diagnosis not present

## 2020-10-25 DIAGNOSIS — N179 Acute kidney failure, unspecified: Secondary | ICD-10-CM | POA: Diagnosis not present

## 2020-10-25 DIAGNOSIS — I452 Bifascicular block: Secondary | ICD-10-CM | POA: Diagnosis not present

## 2020-10-25 DIAGNOSIS — A419 Sepsis, unspecified organism: Secondary | ICD-10-CM | POA: Diagnosis not present

## 2020-10-25 DIAGNOSIS — L89152 Pressure ulcer of sacral region, stage 2: Secondary | ICD-10-CM | POA: Diagnosis not present

## 2020-10-25 DIAGNOSIS — M6281 Muscle weakness (generalized): Secondary | ICD-10-CM | POA: Diagnosis not present

## 2020-10-25 DIAGNOSIS — J1282 Pneumonia due to coronavirus disease 2019: Secondary | ICD-10-CM | POA: Diagnosis not present

## 2020-10-25 DIAGNOSIS — E785 Hyperlipidemia, unspecified: Secondary | ICD-10-CM | POA: Diagnosis not present

## 2020-10-25 NOTE — Telephone Encounter (Signed)
Called to speak with patient today about her appointment, no answer, LVM, stated that I would be calling back again shortly to speak to her about her appointment today

## 2020-10-25 NOTE — Patient Instructions (Signed)
COVID-19 COVID-19 is a respiratory infection that is caused by a virus called severe acute respiratory syndrome coronavirus 2 (SARS-CoV-2). The disease is also known as coronavirus disease or novel coronavirus. In some people, the virus may not cause any symptoms. In others, it may cause a serious infection. The infection can get worse quickly and can lead to complications, such as:  Pneumonia, or infection of the lungs.  Acute respiratory distress syndrome or ARDS. This is a condition in which fluid build-up in the lungs prevents the lungs from filling with air and passing oxygen into the blood.  Acute respiratory failure. This is a condition in which there is not enough oxygen passing from the lungs to the body or when carbon dioxide is not passing from the lungs out of the body.  Sepsis or septic shock. This is a serious bodily reaction to an infection.  Blood clotting problems.  Secondary infections due to bacteria or fungus.  Organ failure. This is when your body's organs stop working. The virus that causes COVID-19 is contagious. This means that it can spread from person to person through droplets from coughs and sneezes (respiratory secretions). What are the causes? This illness is caused by a virus. You may catch the virus by:  Breathing in droplets from an infected person. Droplets can be spread by a person breathing, speaking, singing, coughing, or sneezing.  Touching something, like a table or a doorknob, that was exposed to the virus (contaminated) and then touching your mouth, nose, or eyes. What increases the risk? Risk for infection You are more likely to be infected with this virus if you:  Are within 6 feet (2 meters) of a person with COVID-19.  Provide care for or live with a person who is infected with COVID-19.  Spend time in crowded indoor spaces or live in shared housing. Risk for serious illness You are more likely to become seriously ill from the virus if you:   Are 50 years of age or older. The higher your age, the more you are at risk for serious illness.  Live in a nursing home or long-term care facility.  Have cancer.  Have a long-term (chronic) disease such as: ? Chronic lung disease, including chronic obstructive pulmonary disease or asthma. ? A long-term disease that lowers your body's ability to fight infection (immunocompromised). ? Heart disease, including heart failure, a condition in which the arteries that lead to the heart become narrow or blocked (coronary artery disease), a disease which makes the heart muscle thick, weak, or stiff (cardiomyopathy). ? Diabetes. ? Chronic kidney disease. ? Sickle cell disease, a condition in which red blood cells have an abnormal "sickle" shape. ? Liver disease.  Are obese. What are the signs or symptoms? Symptoms of this condition can range from mild to severe. Symptoms may appear any time from 2 to 14 days after being exposed to the virus. They include:  A fever or chills.  A cough.  Difficulty breathing.  Headaches, body aches, or muscle aches.  Runny or stuffy (congested) nose.  A sore throat.  New loss of taste or smell. Some people may also have stomach problems, such as nausea, vomiting, or diarrhea. Other people may not have any symptoms of COVID-19. How is this diagnosed? This condition may be diagnosed based on:  Your signs and symptoms, especially if: ? You live in an area with a COVID-19 outbreak. ? You recently traveled to or from an area where the virus is common. ? You   provide care for or live with a person who was diagnosed with COVID-19. ? You were exposed to a person who was diagnosed with COVID-19.  A physical exam.  Lab tests, which may include: ? Taking a sample of fluid from the back of your nose and throat (nasopharyngeal fluid), your nose, or your throat using a swab. ? A sample of mucus from your lungs (sputum). ? Blood tests.  Imaging tests, which  may include, X-rays, CT scan, or ultrasound. How is this treated? At present, there is no medicine to treat COVID-19. Medicines that treat other diseases are being used on a trial basis to see if they are effective against COVID-19. Your health care provider will talk with you about ways to treat your symptoms. For most people, the infection is mild and can be managed at home with rest, fluids, and over-the-counter medicines. Treatment for a serious infection usually takes places in a hospital intensive care unit (ICU). It may include one or more of the following treatments. These treatments are given until your symptoms improve.  Receiving fluids and medicines through an IV.  Supplemental oxygen. Extra oxygen is given through a tube in the nose, a face mask, or a hood.  Positioning you to lie on your stomach (prone position). This makes it easier for oxygen to get into the lungs.  Continuous positive airway pressure (CPAP) or bi-level positive airway pressure (BPAP) machine. This treatment uses mild air pressure to keep the airways open. A tube that is connected to a motor delivers oxygen to the body.  Ventilator. This treatment moves air into and out of the lungs by using a tube that is placed in your windpipe.  Tracheostomy. This is a procedure to create a hole in the neck so that a breathing tube can be inserted.  Extracorporeal membrane oxygenation (ECMO). This procedure gives the lungs a chance to recover by taking over the functions of the heart and lungs. It supplies oxygen to the body and removes carbon dioxide. Follow these instructions at home: Lifestyle  If you are sick, stay home except to get medical care. Your health care provider will tell you how long to stay home. Call your health care provider before you go for medical care.  Rest at home as told by your health care provider.  Do not use any products that contain nicotine or tobacco, such as cigarettes, e-cigarettes, and  chewing tobacco. If you need help quitting, ask your health care provider.  Return to your normal activities as told by your health care provider. Ask your health care provider what activities are safe for you. General instructions  Take over-the-counter and prescription medicines only as told by your health care provider.  Drink enough fluid to keep your urine pale yellow.  Keep all follow-up visits as told by your health care provider. This is important. How is this prevented?  There is no vaccine to help prevent COVID-19 infection. However, there are steps you can take to protect yourself and others from this virus. To protect yourself:   Do not travel to areas where COVID-19 is a risk. The areas where COVID-19 is reported change often. To identify high-risk areas and travel restrictions, check the CDC travel website: wwwnc.cdc.gov/travel/notices  If you live in, or must travel to, an area where COVID-19 is a risk, take precautions to avoid infection. ? Stay away from people who are sick. ? Wash your hands often with soap and water for 20 seconds. If soap and water   are not available, use an alcohol-based hand sanitizer. ? Avoid touching your mouth, face, eyes, or nose. ? Avoid going out in public, follow guidance from your state and local health authorities. ? If you must go out in public, wear a cloth face covering or face mask. Make sure your mask covers your nose and mouth. ? Avoid crowded indoor spaces. Stay at least 6 feet (2 meters) away from others. ? Disinfect objects and surfaces that are frequently touched every day. This may include:  Counters and tables.  Doorknobs and light switches.  Sinks and faucets.  Electronics, such as phones, remote controls, keyboards, computers, and tablets. To protect others: If you have symptoms of COVID-19, take steps to prevent the virus from spreading to others.  If you think you have a COVID-19 infection, contact your health care  provider right away. Tell your health care team that you think you may have a COVID-19 infection.  Stay home. Leave your house only to seek medical care. Do not use public transport.  Do not travel while you are sick.  Wash your hands often with soap and water for 20 seconds. If soap and water are not available, use alcohol-based hand sanitizer.  Stay away from other members of your household. Let healthy household members care for children and pets, if possible. If you have to care for children or pets, wash your hands often and wear a mask. If possible, stay in your own room, separate from others. Use a different bathroom.  Make sure that all people in your household wash their hands well and often.  Cough or sneeze into a tissue or your sleeve or elbow. Do not cough or sneeze into your hand or into the air.  Wear a cloth face covering or face mask. Make sure your mask covers your nose and mouth. Where to find more information  Centers for Disease Control and Prevention: www.cdc.gov/coronavirus/2019-ncov/index.html  World Health Organization: www.who.int/health-topics/coronavirus Contact a health care provider if:  You live in or have traveled to an area where COVID-19 is a risk and you have symptoms of the infection.  You have had contact with someone who has COVID-19 and you have symptoms of the infection. Get help right away if:  You have trouble breathing.  You have pain or pressure in your chest.  You have confusion.  You have bluish lips and fingernails.  You have difficulty waking from sleep.  You have symptoms that get worse. These symptoms may represent a serious problem that is an emergency. Do not wait to see if the symptoms will go away. Get medical help right away. Call your local emergency services (911 in the [place] Do not drive yourself to the hospital. Let the emergency medical personnel know if you think you have COVID-19. Summary  COVID-19 is a  respiratory infection that is caused by a virus. It is also known as coronavirus disease or novel coronavirus. It can cause serious infections, such as pneumonia, acute respiratory distress syndrome, acute respiratory failure, or sepsis.  The virus that causes COVID-19 is contagious. This means that it can spread from person to person through droplets from breathing, speaking, singing, coughing, or sneezing.  You are more likely to develop a serious illness if you are 50 years of age or older, have a weak immune system, live in a nursing home, or have chronic disease.  There is no medicine to treat COVID-19. Your health care provider will talk with you about ways to treat your symptoms.    Take steps to protect yourself and others from infection. Wash your hands often and disinfect objects and surfaces that are frequently touched every day. Stay away from people who are sick and wear a mask if you are sick. This information is not intended to replace advice given to you by your health care provider. Make sure you discuss any questions you have with your health care provider. Document Revised: 08/20/2019 Document Reviewed: 11/26/2018 Elsevier Patient Education  2020 [employer] Inc.  

## 2020-10-25 NOTE — Progress Notes (Signed)
Attempted to call x 2, left general HIPAA compliant message.  No answer x 2.  Last attempt at 1547.  Will order labs for home health and have reschedule.

## 2020-10-26 ENCOUNTER — Telehealth: Payer: Self-pay | Admitting: Nurse Practitioner

## 2020-10-26 ENCOUNTER — Other Ambulatory Visit: Payer: Self-pay | Admitting: Nurse Practitioner

## 2020-10-26 ENCOUNTER — Other Ambulatory Visit: Payer: Self-pay

## 2020-10-26 DIAGNOSIS — U071 COVID-19: Secondary | ICD-10-CM | POA: Diagnosis not present

## 2020-10-26 DIAGNOSIS — I480 Paroxysmal atrial fibrillation: Secondary | ICD-10-CM | POA: Diagnosis not present

## 2020-10-26 DIAGNOSIS — N179 Acute kidney failure, unspecified: Secondary | ICD-10-CM | POA: Diagnosis not present

## 2020-10-26 DIAGNOSIS — R531 Weakness: Secondary | ICD-10-CM | POA: Diagnosis not present

## 2020-10-26 DIAGNOSIS — I493 Ventricular premature depolarization: Secondary | ICD-10-CM | POA: Diagnosis not present

## 2020-10-26 DIAGNOSIS — E039 Hypothyroidism, unspecified: Secondary | ICD-10-CM | POA: Diagnosis not present

## 2020-10-26 DIAGNOSIS — M6281 Muscle weakness (generalized): Secondary | ICD-10-CM | POA: Diagnosis not present

## 2020-10-26 DIAGNOSIS — J9601 Acute respiratory failure with hypoxia: Secondary | ICD-10-CM | POA: Diagnosis not present

## 2020-10-26 DIAGNOSIS — J1282 Pneumonia due to coronavirus disease 2019: Secondary | ICD-10-CM | POA: Diagnosis not present

## 2020-10-26 DIAGNOSIS — A419 Sepsis, unspecified organism: Secondary | ICD-10-CM | POA: Diagnosis not present

## 2020-10-26 DIAGNOSIS — L89152 Pressure ulcer of sacral region, stage 2: Secondary | ICD-10-CM | POA: Diagnosis not present

## 2020-10-26 DIAGNOSIS — E0590 Thyrotoxicosis, unspecified without thyrotoxic crisis or storm: Secondary | ICD-10-CM | POA: Diagnosis not present

## 2020-10-26 DIAGNOSIS — E785 Hyperlipidemia, unspecified: Secondary | ICD-10-CM | POA: Diagnosis not present

## 2020-10-26 DIAGNOSIS — I452 Bifascicular block: Secondary | ICD-10-CM | POA: Diagnosis not present

## 2020-10-26 LAB — LIPID PANEL
Cholesterol: 127 (ref 0–200)
HDL: 33 — AB (ref 35–70)
LDL Cholesterol: 80
Triglycerides: 68 (ref 40–160)

## 2020-10-26 LAB — CBC AND DIFFERENTIAL
HCT: 10 — AB (ref 36–46)
Hemoglobin: 2.6 — AB (ref 12.0–16.0)
Neutrophils Absolute: 5.8
Platelets: 358 (ref 150–399)
WBC: 9.2

## 2020-10-26 LAB — HEPATIC FUNCTION PANEL
ALT: 5 — AB (ref 7–35)
AST: 12 — AB (ref 13–35)
Alkaline Phosphatase: 48 (ref 25–125)
Bilirubin, Total: 0.2

## 2020-10-26 LAB — TSH: TSH: 38.8 — AB (ref <=5.90)

## 2020-10-26 LAB — COMPREHENSIVE METABOLIC PANEL
Albumin: 3.2 — AB (ref 3.5–5.0)
Calcium: 9.3 (ref 8.7–10.7)
GFR calc Af Amer: 61
GFR calc non Af Amer: 53
Globulin: 2.6

## 2020-10-26 LAB — BASIC METABOLIC PANEL
BUN: 11 (ref 4–21)
CO2: 20 (ref 13–22)
Chloride: 104 (ref 99–108)
Creatinine: 1.1 (ref <=1.1)
Glucose: 108
Potassium: 4.1 (ref 3.4–5.3)
Sodium: 136 — AB (ref 137–147)

## 2020-10-26 LAB — CBC: RBC: 1.23 — AB (ref 3.87–5.11)

## 2020-10-26 NOTE — Telephone Encounter (Signed)
Called and left a detailed message for [facility] to let her know Jolene's response and lab orders.

## 2020-10-26 NOTE — Telephone Encounter (Signed)
Called and left a detailed message for Courtney Anderson

## 2020-10-26 NOTE — Telephone Encounter (Signed)
Called to get verification from the doctor or nurse that [facility] needs to do labs on the patient.  Patient told PT that the doctor wanted her to draw labs, but verification is needed by the doctor before this can be done.  Please call to discuss at [phone]

## 2020-10-26 NOTE — Telephone Encounter (Signed)
Please alert home health nurse to tell patient to stop Benicar at this time (Olmesartan) and maintain remainder of medications.  Continue increase hydration and obtain labs I ordered yesterday, in chart.  If symptomatic with dizziness, headache, CP, SOB then she is to immediately go to ER.

## 2020-10-26 NOTE — Telephone Encounter (Signed)
Please verify with number below, I have placed future labs in chart from visit yesterday that I would like done -- please alert home health nurse.  Thank you.

## 2020-10-26 NOTE — Telephone Encounter (Signed)
Courtney Anderson from [facility] called and stated that pt is having orthostatic BP again / this morning  BP was at 118/60 and HR was 88 sitting  And standing she is dropping to 100/50  After 2 minutes of sitting she was 110/60 and her HR was ranging from 70 to 91  Nurse may call to get orders for a blood draw and they dont know if her meds needs to be adjusted/ Pt has been doing better and increasing her drinking but she is still dropping in BP / please advise

## 2020-10-26 NOTE — Telephone Encounter (Signed)
Pervious message has been sent to provider regard this but will route as FYI and due to having # to call

## 2020-10-26 NOTE — Telephone Encounter (Signed)
Jolene, it looks like you saw her yesterday

## 2020-10-27 ENCOUNTER — Emergency Department: Payer: Medicare Other

## 2020-10-27 ENCOUNTER — Other Ambulatory Visit: Payer: Self-pay

## 2020-10-27 ENCOUNTER — Inpatient Hospital Stay
Admission: EM | Admit: 2020-10-27 | Discharge: 2020-10-30 | DRG: 377 | Disposition: A | Discharge status: Home Health | Payer: Medicare Other | Attending: Internal Medicine | Admitting: Internal Medicine

## 2020-10-27 DIAGNOSIS — Z7902 Long term (current) use of antithrombotics/antiplatelets: Secondary | ICD-10-CM | POA: Diagnosis not present

## 2020-10-27 DIAGNOSIS — D6832 Hemorrhagic disorder due to extrinsic circulating anticoagulants: Secondary | ICD-10-CM | POA: Diagnosis present

## 2020-10-27 DIAGNOSIS — Z823 Family history of stroke: Secondary | ICD-10-CM

## 2020-10-27 DIAGNOSIS — E785 Hyperlipidemia, unspecified: Secondary | ICD-10-CM | POA: Diagnosis not present

## 2020-10-27 DIAGNOSIS — R531 Weakness: Secondary | ICD-10-CM | POA: Diagnosis not present

## 2020-10-27 DIAGNOSIS — Z8261 Family history of arthritis: Secondary | ICD-10-CM

## 2020-10-27 DIAGNOSIS — I4891 Unspecified atrial fibrillation: Secondary | ICD-10-CM | POA: Diagnosis not present

## 2020-10-27 DIAGNOSIS — T45515A Adverse effect of anticoagulants, initial encounter: Secondary | ICD-10-CM | POA: Diagnosis not present

## 2020-10-27 DIAGNOSIS — K922 Gastrointestinal hemorrhage, unspecified: Secondary | ICD-10-CM | POA: Diagnosis not present

## 2020-10-27 DIAGNOSIS — D120 Benign neoplasm of cecum: Secondary | ICD-10-CM | POA: Diagnosis not present

## 2020-10-27 DIAGNOSIS — Z6834 Body mass index (BMI) 34.0-34.9, adult: Secondary | ICD-10-CM | POA: Diagnosis not present

## 2020-10-27 DIAGNOSIS — Z20822 Contact with and (suspected) exposure to covid-19: Secondary | ICD-10-CM | POA: Diagnosis present

## 2020-10-27 DIAGNOSIS — D62 Acute posthemorrhagic anemia: Secondary | ICD-10-CM | POA: Diagnosis not present

## 2020-10-27 DIAGNOSIS — Z8249 Family history of ischemic heart disease and other diseases of the circulatory system: Secondary | ICD-10-CM

## 2020-10-27 DIAGNOSIS — E669 Obesity, unspecified: Secondary | ICD-10-CM | POA: Diagnosis present

## 2020-10-27 DIAGNOSIS — Z79899 Other long term (current) drug therapy: Secondary | ICD-10-CM

## 2020-10-27 DIAGNOSIS — I499 Cardiac arrhythmia, unspecified: Secondary | ICD-10-CM | POA: Diagnosis not present

## 2020-10-27 DIAGNOSIS — Z13810 Encounter for screening for upper gastrointestinal disorder: Secondary | ICD-10-CM | POA: Diagnosis not present

## 2020-10-27 DIAGNOSIS — Z87891 Personal history of nicotine dependence: Secondary | ICD-10-CM | POA: Diagnosis not present

## 2020-10-27 DIAGNOSIS — U071 COVID-19: Secondary | ICD-10-CM | POA: Diagnosis not present

## 2020-10-27 DIAGNOSIS — Z7982 Long term (current) use of aspirin: Secondary | ICD-10-CM | POA: Diagnosis not present

## 2020-10-27 DIAGNOSIS — Z885 Allergy status to narcotic agent status: Secondary | ICD-10-CM | POA: Diagnosis not present

## 2020-10-27 DIAGNOSIS — A419 Sepsis, unspecified organism: Secondary | ICD-10-CM | POA: Diagnosis not present

## 2020-10-27 DIAGNOSIS — R578 Other shock: Secondary | ICD-10-CM | POA: Diagnosis present

## 2020-10-27 DIAGNOSIS — I10 Essential (primary) hypertension: Secondary | ICD-10-CM | POA: Diagnosis present

## 2020-10-27 DIAGNOSIS — I480 Paroxysmal atrial fibrillation: Secondary | ICD-10-CM | POA: Diagnosis not present

## 2020-10-27 DIAGNOSIS — E559 Vitamin D deficiency, unspecified: Secondary | ICD-10-CM | POA: Diagnosis present

## 2020-10-27 DIAGNOSIS — D649 Anemia, unspecified: Secondary | ICD-10-CM

## 2020-10-27 DIAGNOSIS — Z8616 Personal history of COVID-19: Secondary | ICD-10-CM

## 2020-10-27 DIAGNOSIS — I517 Cardiomegaly: Secondary | ICD-10-CM | POA: Diagnosis not present

## 2020-10-27 DIAGNOSIS — J9601 Acute respiratory failure with hypoxia: Secondary | ICD-10-CM | POA: Diagnosis not present

## 2020-10-27 DIAGNOSIS — K635 Polyp of colon: Secondary | ICD-10-CM | POA: Diagnosis not present

## 2020-10-27 LAB — CBC WITH DIFFERENTIAL/PLATELET
Abs Immature Granulocytes: 0.16 10*3/uL — ABNORMAL HIGH (ref 0.00–0.07)
Abs Immature Granulocytes: 0.12 10*3/uL — ABNORMAL HIGH (ref 0.00–0.07)
Abs Immature Granulocytes: 0.08 10*3/uL — ABNORMAL HIGH (ref 0.00–0.07)
Basophils Absolute: 0 10*3/uL (ref 0.0–0.1)
Basophils Absolute: 0 10*3/uL (ref 0.0–0.1)
Basophils Absolute: 0.1 10*3/uL (ref 0.0–0.1)
Basophils Relative: 0 %
Basophils Relative: 0 %
Basophils Relative: 0 %
Eosinophils Absolute: 0.3 10*3/uL (ref 0.0–0.5)
Eosinophils Absolute: 0.1 10*3/uL (ref 0.0–0.5)
Eosinophils Absolute: 0 10*3/uL (ref 0.0–0.5)
Eosinophils Relative: 2 %
Eosinophils Relative: 1 %
Eosinophils Relative: 0 %
HCT: 10.1 % — CL (ref 36.0–46.0)
HCT: 8.6 % — CL (ref 36.0–46.0)
HCT: 16.7 % — ABNORMAL LOW (ref 36.0–46.0)
Hemoglobin: 2.7 g/dL — CL (ref 12.0–15.0)
Hemoglobin: 2.3 g/dL — CL (ref 12.0–15.0)
Hemoglobin: 5.1 g/dL — ABNORMAL LOW (ref 12.0–15.0)
Immature Granulocytes: 1 %
Immature Granulocytes: 1 %
Immature Granulocytes: 1 %
Lymphocytes Relative: 29 %
Lymphocytes Relative: 9 %
Lymphocytes Relative: 11 %
Lymphs Abs: 4.6 10*3/uL — ABNORMAL HIGH (ref 0.7–4.0)
Lymphs Abs: 1.2 10*3/uL (ref 0.7–4.0)
Lymphs Abs: 1.4 10*3/uL (ref 0.7–4.0)
MCH: 20.9 pg — ABNORMAL LOW (ref 26.0–34.0)
MCH: 21.5 pg — ABNORMAL LOW (ref 26.0–34.0)
MCH: 26.2 pg (ref 26.0–34.0)
MCHC: 26.7 g/dL — ABNORMAL LOW (ref 30.0–36.0)
MCHC: 26.7 g/dL — ABNORMAL LOW (ref 30.0–36.0)
MCHC: 30.5 g/dL (ref 30.0–36.0)
MCV: 78.3 fL — ABNORMAL LOW (ref 80.0–100.0)
MCV: 80.4 fL (ref 80.0–100.0)
MCV: 85.6 fL (ref 80.0–100.0)
Monocytes Absolute: 1.1 10*3/uL — ABNORMAL HIGH (ref 0.1–1.0)
Monocytes Absolute: 0.6 10*3/uL (ref 0.1–1.0)
Monocytes Absolute: 1 10*3/uL (ref 0.1–1.0)
Monocytes Relative: 7 %
Monocytes Relative: 4 %
Monocytes Relative: 8 %
Neutro Abs: 10 10*3/uL — ABNORMAL HIGH (ref 1.7–7.7)
Neutro Abs: 11.3 10*3/uL — ABNORMAL HIGH (ref 1.7–7.7)
Neutro Abs: 9.7 10*3/uL — ABNORMAL HIGH (ref 1.7–7.7)
Neutrophils Relative %: 61 %
Neutrophils Relative %: 85 %
Neutrophils Relative %: 80 %
Platelets: 429 10*3/uL — ABNORMAL HIGH (ref 150–400)
Platelets: 344 10*3/uL (ref 150–400)
Platelets: 291 10*3/uL (ref 150–400)
RBC: 1.29 MIL/uL — ABNORMAL LOW (ref 3.87–5.11)
RBC: 1.07 MIL/uL — ABNORMAL LOW (ref 3.87–5.11)
RBC: 1.95 MIL/uL — ABNORMAL LOW (ref 3.87–5.11)
RDW: 21.2 % — ABNORMAL HIGH (ref 11.5–15.5)
RDW: 20.7 % — ABNORMAL HIGH (ref 11.5–15.5)
RDW: 17.2 % — ABNORMAL HIGH (ref 11.5–15.5)
Smear Review: NORMAL
WBC: 16.2 10*3/uL — ABNORMAL HIGH (ref 4.0–10.5)
WBC: 13.2 10*3/uL — ABNORMAL HIGH (ref 4.0–10.5)
WBC: 12.1 10*3/uL — ABNORMAL HIGH (ref 4.0–10.5)
nRBC: 2.8 % — ABNORMAL HIGH (ref 0.0–0.2)
nRBC: 2.1 % — ABNORMAL HIGH (ref 0.0–0.2)
nRBC: 1.7 % — ABNORMAL HIGH (ref 0.0–0.2)

## 2020-10-27 LAB — COMPREHENSIVE METABOLIC PANEL
ALT: 8 U/L (ref 0–44)
AST: 19 U/L (ref 15–41)
Albumin: 2.9 g/dL — ABNORMAL LOW (ref 3.5–5.0)
Alkaline Phosphatase: 42 U/L (ref 38–126)
Anion gap: 11 (ref 5–15)
BUN: 15 mg/dL (ref 8–23)
CO2: 20 mmol/L — ABNORMAL LOW (ref 22–32)
Calcium: 9.7 mg/dL (ref 8.9–10.3)
Chloride: 105 mmol/L (ref 98–111)
Creatinine, Ser: 1.09 mg/dL — ABNORMAL HIGH (ref 0.44–1.00)
GFR, Estimated: 56 mL/min — ABNORMAL LOW (ref >60)
Glucose, Bld: 155 mg/dL — ABNORMAL HIGH (ref 70–99)
Potassium: 4.5 mmol/L (ref 3.5–5.1)
Sodium: 136 mmol/L (ref 135–145)
Total Bilirubin: 0.6 mg/dL (ref 0.3–1.2)
Total Protein: 6 g/dL — ABNORMAL LOW (ref 6.5–8.1)

## 2020-10-27 LAB — RESP PANEL BY RT-PCR (FLU A&B, COVID) ARPGX2
Influenza A by PCR: NEGATIVE
Influenza B by PCR: NEGATIVE
SARS Coronavirus 2 by RT PCR: NEGATIVE

## 2020-10-27 LAB — VITAMIN B12: Vitamin B-12: 262 pg/mL (ref 180–914)

## 2020-10-27 LAB — PREPARE RBC (CROSSMATCH)

## 2020-10-27 LAB — ABO/RH: ABO/RH(D): O POS

## 2020-10-27 LAB — IRON AND TIBC
Iron: 13 ug/dL — ABNORMAL LOW (ref 28–170)
Saturation Ratios: 3 % — ABNORMAL LOW (ref 10.4–31.8)
TIBC: 463 ug/dL — ABNORMAL HIGH (ref 250–450)
UIBC: 450 ug/dL

## 2020-10-27 LAB — FOLATE: Folate: 17.1 ng/mL (ref >5.9)

## 2020-10-27 LAB — RETICULOCYTES
Immature Retic Fract: 9.3 % (ref 2.3–15.9)
RBC.: 1.28 MIL/uL — ABNORMAL LOW (ref 3.87–5.11)
Retic Count, Absolute: 88.8 10*3/uL (ref 19.0–186.0)
Retic Ct Pct: 6.9 % — ABNORMAL HIGH (ref 0.4–3.1)

## 2020-10-27 LAB — FERRITIN: Ferritin: 24 ng/mL (ref 11–307)

## 2020-10-27 LAB — MRSA PCR SCREENING: MRSA by PCR: NEGATIVE

## 2020-10-27 MED ORDER — DOCUSATE SODIUM 100 MG PO CAPS: 100.0000 mg | ORAL_CAPSULE | Freq: Two times a day (BID) | ORAL | Status: DC | PRN

## 2020-10-27 MED ORDER — SODIUM CHLORIDE 0.9 % IV BOLUS: 1000.0000 mL | Freq: Once | INTRAVENOUS | Status: DC

## 2020-10-27 MED ORDER — SODIUM CHLORIDE 0.9% IV SOLUTION: Freq: Once | INTRAVENOUS | Status: AC

## 2020-10-27 MED ORDER — INFLUENZA VAC A&B SA ADJ QUAD 0.5 ML IM PRSY
0.5000 mL | PREFILLED_SYRINGE | INTRAMUSCULAR | Status: DC
  Filled 2020-10-27: qty 0.5

## 2020-10-27 MED ORDER — ASPIRIN 81 MG PO CHEW: 324.0000 mg | CHEWABLE_TABLET | ORAL | Status: DC

## 2020-10-27 MED ORDER — POLYETHYLENE GLYCOL 3350 17 G PO PACK: 17.0000 g | PACK | Freq: Every day | ORAL | Status: DC | PRN

## 2020-10-27 MED ORDER — PANTOPRAZOLE SODIUM 40 MG IV SOLR
40.0000 mg | Freq: Every day | INTRAVENOUS | Status: DC
  Administered 2020-10-27 – 2020-10-29 (×3): 40 mg via INTRAVENOUS
  Filled 2020-10-27 (×3): qty 40

## 2020-10-27 MED ORDER — SODIUM CHLORIDE 0.9% IV SOLUTION
Freq: Once | INTRAVENOUS | Status: DC
  Filled 2020-10-27: qty 250

## 2020-10-27 MED ORDER — SODIUM CHLORIDE 0.9 % IV SOLN
10.0000 mL/h | Freq: Once | INTRAVENOUS | Status: AC
  Administered 2020-10-27: 10 mL/h via INTRAVENOUS

## 2020-10-27 MED ORDER — SODIUM CHLORIDE 0.9 % IV BOLUS
1000.0000 mL | Freq: Once | INTRAVENOUS | Status: AC
  Administered 2020-10-27: 1000 mL via INTRAVENOUS

## 2020-10-27 MED ORDER — LACTATED RINGERS IV SOLN: INTRAVENOUS | Status: AC

## 2020-10-27 MED ORDER — SODIUM CHLORIDE 0.9 % IV SOLN
80.0000 mg | Freq: Once | INTRAVENOUS | Status: AC
  Administered 2020-10-27: 80 mg via INTRAVENOUS
  Filled 2020-10-27: qty 80

## 2020-10-27 MED ORDER — CHLORHEXIDINE GLUCONATE CLOTH 2 % EX PADS
6.0000 | MEDICATED_PAD | Freq: Every day | CUTANEOUS | Status: DC
  Administered 2020-10-28 – 2020-10-30 (×2): 6 via TOPICAL

## 2020-10-27 MED ORDER — SODIUM CHLORIDE 0.9 % IV SOLN
8.0000 mg/h | INTRAVENOUS | Status: DC
  Filled 2020-10-27: qty 80

## 2020-10-27 MED ORDER — ASPIRIN 300 MG RE SUPP: 300.0000 mg | RECTAL | Status: DC

## 2020-10-27 NOTE — ED Notes (Signed)
Pt being very disrespectful with RN.   Pt questioning why 2nd IV is needed, why purewick is needed, visitation policies, duration of stay and location of admitting provider. This RN attempted to explain to pt, however pt began yelling at RN and calling this RN a heifer. Pt  And grandson requested new RN to take to ICU, Charge RN aware and this RN left room.

## 2020-10-27 NOTE — ED Notes (Addendum)
Pt placed on purewick  Grandson at bedside

## 2020-10-27 NOTE — Consult Note (Signed)
PHARMACY CONSULT NOTE - FOLLOW UP  Pharmacy Consult for Electrolyte Monitoring and Replacement   Recent Labs: Potassium (mmol/L)  Date Value  10/27/2020 4.5   Magnesium (mg/dL)  Date Value  08/28/2020 2.1   Calcium (mg/dL)  Date Value  10/27/2020 9.7   Calcium, Total (PTH) (mg/dL)  Date Value  07/24/2020 10.1   Albumin (g/dL)  Date Value  10/27/2020 2.9 (L)  10/22/2019 4.2   Phosphorus (mg/dL)  Date Value  08/28/2020 2.9   Sodium (mmol/L)  Date Value  10/27/2020 136  10/22/2019 139   Corrected Calcium: 10.5  Assessment: 64 year old female with history of atrial fibrillation presents to the ER for evaluation of generalized malaise fatigue and concern for anemia. Pharmacy consulted for electrolyte management  Relevant Medications: --none at this point (Olmesartan on home med list)  Goal of Therapy:  Atrial Fibrillation --K+ > 4.0 mmol/L --Mag > 2.0 g/dL  Plan:   No electrolyte replacement needed at this time  Will check Mag and BMP with morning labs  Dorothe Pea ,PharmD, BCPS Clinical Pharmacist 10/27/2020 3:20 PM

## 2020-10-27 NOTE — Plan of Care (Signed)
  Problem: Education: Goal: Ability to identify signs and symptoms of gastrointestinal bleeding will improve 10/27/2020 1644 by Freddi Che, RN Outcome: Progressing 10/27/2020 1644 by Freddi Che, RN Outcome: Progressing   Problem: Bowel/Gastric: Goal: Will show no signs and symptoms of gastrointestinal bleeding 10/27/2020 1644 by Freddi Che, RN Outcome: Progressing 10/27/2020 1644 by Freddi Che, RN Outcome: Progressing   Problem: Fluid Volume: Goal: Will show no signs and symptoms of excessive bleeding 10/27/2020 1644 by Freddi Che, RN Outcome: Progressing 10/27/2020 1644 by Freddi Che, RN Outcome: Progressing   Problem: Clinical Measurements: Goal: Complications related to the disease process, condition or treatment will be avoided or minimized 10/27/2020 1644 by Freddi Che, RN Outcome: Progressing 10/27/2020 1644 by Freddi Che, RN Outcome: Progressing

## 2020-10-27 NOTE — ED Notes (Signed)
First RN:    Pt comes into the ED via [facility] from home c/o generalized weakness.  Pt's home health states her Hgb is low but the level is unknown.  Pt is non-compliant with her home O2 and was at 88% RA.  Now on 2L and sating at 100%.  Pt has soft pressures but is neurologically intact and able to answer all questions.  Pt in NAD at this time with even and unlabored respirations.

## 2020-10-27 NOTE — ED Notes (Signed)
Dr Quentin Cornwall aware of critical hgb. Orders placed

## 2020-10-27 NOTE — ED Triage Notes (Signed)
Pt presents via EMS c/o anemia. Pt reports sent to ED by home RN due to low hemoglobin. Pt unable to give actual result.

## 2020-10-27 NOTE — ED Provider Notes (Signed)
[facility] Emergency Department Provider Note    Event Date/Time   First MD Initiated Contact with Patient 10/27/20 1225     (approximate)  I have reviewed the triage vital signs and the nursing notes.   HISTORY  Chief Complaint Hypotension and Anemia    HPI Courtney Anderson is a 64 y.o. female with the below listed past medical history with previous admission to hospital for COVID-19 infection on chronic 2 L nasal cannula now presents to the ER for evaluation of generalized malaise fatigue and concern for anemia. Reportedly had home health visit with blood work drawn reportedly was having signs of anemia. She denies any melena or hematochezia. No recent trauma. She is on Xarelto.    Past Medical History:  Diagnosis Date  . Allergy   . COVID-19   . Elevated transaminase level   . Hyperlipidemia   . Hypertension   . Obesity    Family History  Problem Relation Age of Onset  . Hypertension Father   . Stroke Father   . Hypertension Mother   . Arthritis Mother   . Aneurysm Sister   . Hypertension Sister   . Hypertension Brother   . Hypertension Sister   . Hypertension Brother    Past Surgical History:  Procedure Laterality Date  . BREAST SURGERY    . ECTOPIC PREGNANCY SURGERY     Patient Active Problem List   Diagnosis Date Noted  . Hemorrhagic shock ([place]) 10/27/2020  . Sacral wound 09/06/2020  . Aspiration into airway   . Acute kidney failure, unspecified ([place]) 07/24/2020  . Acute hypoxemic respiratory failure due to COVID-19 ([place]) 07/24/2020  . Atrial fibrillation with RVR ([place]) 07/24/2020  . Vitamin D deficiency 10/17/2019  . 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 Hypercalcemia 08/20/2018  . Hyperthyroidism 08/20/2018  . Multinodular goiter 08/20/2018  . Hypertension 05/31/2015  . Morbid obesity ([place]) 05/31/2015  . Elevated transaminase level 05/31/2015  . Hyperlipidemia 05/31/2015      Prior to Admission medications    Medication Sig Start Date End Date Taking? Authorizing Provider  amiodarone (PACERONE) 200 MG tablet Take 1 tablet (200 mg total) by mouth daily. 10/02/20  Yes Cannady, Henrine Screws T, NP  aspirin 81 MG tablet Take 81 mg by mouth daily.   Yes [provider]  Cholecalciferol (VITAMIN D) 50 MCG (2000 UT) tablet Take 2,000 Units by mouth daily.   Yes [provider]  Multiple Vitamin (MULTIVITAMIN) tablet Take 1 tablet by mouth daily.   Yes [provider]  olmesartan (BENICAR) 20 MG tablet Take 1 tablet (20 mg total) by mouth daily. 09/07/20  Yes Cannady, Henrine Screws T, NP  rivaroxaban (XARELTO) 20 MG TABS tablet Take 1 tablet (20 mg total) by mouth daily with supper. 10/02/20  Yes Venita Lick, NP    Allergies Codeine    Social History Social History   Tobacco Use  . Smoking status: Former Research scientist (life sciences)  . Smokeless tobacco: Never Used  Substance Use Topics  . Alcohol use: Yes    Alcohol/week: 0.0 standard drinks    Comment: on occasion  . Drug use: No    Review of Systems Patient denies headaches, rhinorrhea, blurry vision, numbness, shortness of breath, chest pain, edema, cough, abdominal pain, nausea, vomiting, diarrhea, dysuria, fevers, rashes or hallucinations unless otherwise stated above in HPI. ____________________________________________   PHYSICAL EXAM:  VITAL SIGNS: Vitals:   10/27/20 1500 10/27/20 1519  BP: 97/64 97/64  Pulse:  69  Resp:  17 (!) 22  Temp:  97.6 F (36.4 C)  SpO2:  100%    Constitutional: Alert and oriented. Very pale appearing Eyes: Conjunctivae are normal.  Head: Atraumatic. Nose: No congestion/rhinnorhea. Mouth/Throat: Mucous membranes are moist.   Neck: No stridor. Painless ROM.  Cardiovascular: Normal rate, regular rhythm. Grossly normal heart sounds.  Good peripheral circulation. Respiratory: Normal respiratory effort.  No retractions. Lungs CTAB. Gastrointestinal: Soft and nontender. No distention. No abdominal  bruits. No CVA tenderness. Genitourinary: soft light brown stool,  Guaiac + Musculoskeletal: No lower extremity tenderness nor edema.  No joint effusions. Neurologic:  Normal speech and language. No gross focal neurologic deficits are appreciated. No facial droop Skin:  Skin is warm, dry and intact. No rash noted. Psychiatric: Mood and affect are normal. Speech and behavior are normal.  ____________________________________________   LABS (all labs ordered are listed, but only abnormal results are displayed)  Results for orders placed or performed during the hospital encounter of 10/27/20 (from the past 24 hour(s))  Type and screen [place]     Status: None (Preliminary result)   Collection Time: 10/27/20 12:44 PM  Result Value Ref Range   ABO/RH(D) O POS    Antibody Screen NEG    Sample Expiration 10/30/2020,2359    Unit Number [id number]    Blood Component Type RED CELLS,LR    Unit division 00    Status of Unit ISSUED    Transfusion Status OK TO TRANSFUSE    Crossmatch Result      Compatible Performed at [facility], [address] [place], [place] [address]    Unit Number [id number]    Blood Component Type RBC LR PHER1    Unit division 00    Status of Unit ALLOCATED    Transfusion Status OK TO TRANSFUSE    Crossmatch Result Compatible    Unit Number [id number]    Blood Component Type RED CELLS,LR    Unit division 00    Status of Unit ALLOCATED    Transfusion Status OK TO TRANSFUSE    Crossmatch Result Compatible    Unit Number [id number]    Blood Component Type RED CELLS,LR    Unit division 00    Status of Unit ALLOCATED    Transfusion Status OK TO TRANSFUSE    Crossmatch Result Compatible   CBC with Differential     Status: Abnormal   Collection Time: 10/27/20 12:44 PM  Result Value Ref Range   WBC 16.2 (H) 4.0 - 10.5 K/uL   RBC 1.29 (L) 3.87 - 5.11 MIL/uL   Hemoglobin 2.7 (LL) 12.0 - 15.0 g/dL   HCT 10.1 (LL) 36.0  - 46.0 %   MCV 78.3 (L) 80.0 - 100.0 fL   MCH 20.9 (L) 26.0 - 34.0 pg   MCHC 26.7 (L) 30.0 - 36.0 g/dL   RDW 21.2 (H) 11.5 - 15.5 %   Platelets 429 (H) 150 - 400 K/uL   nRBC 2.8 (H) 0.0 - 0.2 %   Neutrophils Relative % 61 %   Neutro Abs 10.0 (H) 1.7 - 7.7 K/uL   Lymphocytes Relative 29 %   Lymphs Abs 4.6 (H) 0.7 - 4.0 K/uL   Monocytes Relative 7 %   Monocytes Absolute 1.1 (H) 0.1 - 1.0 K/uL   Eosinophils Relative 2 %   Eosinophils Absolute 0.3 0.0 - 0.5 K/uL   Basophils Relative 0 %   Basophils Absolute 0.0 0.0 - 0.1 K/uL   WBC Morphology MORPHOLOGY UNREMARKABLE  Smear Review Normal platelet morphology    Immature Granulocytes 1 %   Abs Immature Granulocytes 0.16 (H) 0.00 - 0.07 K/uL   Tear Drop Cells PRESENT    Polychromasia PRESENT    Target Cells PRESENT   Comprehensive metabolic panel     Status: Abnormal   Collection Time: 10/27/20 12:44 PM  Result Value Ref Range   Sodium 136 135 - 145 mmol/L   Potassium 4.5 3.5 - 5.1 mmol/L   Chloride 105 98 - 111 mmol/L   CO2 20 (L) 22 - 32 mmol/L   Glucose, Bld 155 (H) 70 - 99 mg/dL   BUN 15 8 - 23 mg/dL   Creatinine, Ser 1.09 (H) 0.44 - 1.00 mg/dL   Calcium 9.7 8.9 - 10.3 mg/dL   Total Protein 6.0 (L) 6.5 - 8.1 g/dL   Albumin 2.9 (L) 3.5 - 5.0 g/dL   AST 19 15 - 41 U/L   ALT 8 0 - 44 U/L   Alkaline Phosphatase 42 38 - 126 U/L   Total Bilirubin 0.6 0.3 - 1.2 mg/dL   GFR, Estimated 56 (L) >60 mL/min   Anion gap 11 5 - 15  Folate     Status: None   Collection Time: 10/27/20  1:02 PM  Result Value Ref Range   Folate 17.1 >5.9 ng/mL  Iron and TIBC     Status: Abnormal   Collection Time: 10/27/20  1:02 PM  Result Value Ref Range   Iron 13 (L) 28 - 170 ug/dL   TIBC 463 (H) 250 - 450 ug/dL   Saturation Ratios 3 (L) 10.4 - 31.8 %   UIBC 450 ug/dL  Ferritin     Status: None   Collection Time: 10/27/20  1:02 PM  Result Value Ref Range   Ferritin 24 11 - 307 ng/mL  Reticulocytes     Status: Abnormal   Collection Time:  10/27/20  1:02 PM  Result Value Ref Range   Retic Ct Pct 6.9 (H) 0.4 - 3.1 %   RBC. 1.28 (L) 3.87 - 5.11 MIL/uL   Retic Count, Absolute 88.8 19.0 - 186.0 K/uL   Immature Retic Fract 9.3 2.3 - 15.9 %  Prepare RBC (crossmatch)     Status: None   Collection Time: 10/27/20  2:00 PM  Result Value Ref Range   Order Confirmation      ORDER PROCESSED BY BLOOD BANK Performed at [facility], [address] [place], [place] [address]   ABO/Rh     Status: None   Collection Time: 10/27/20  2:20 PM  Result Value Ref Range   ABO/RH(D)      O POS Performed at [facility], [address], [place], [place] [address]    ____________________________________________  EKG My review and personal interpretation at Time: 12:34   Indication: hypotension  Rate: 80  Rhythm: sinus Axis: normal Other: borderline prolonged qt, no stemi, ____________________________________________  RADIOLOGY  I personally reviewed all radiographic images ordered to evaluate for the above acute complaints and reviewed radiology reports and findings.  These findings were personally discussed with the patient.  Please see medical record for radiology report.  ____________________________________________   PROCEDURES  Procedure(s) performed:  .Critical Care Performed by: Merlyn Lot, MD Authorized by: Merlyn Lot, MD   Critical care provider statement:    Critical care time (minutes):  45   Critical care was necessary to treat or prevent imminent or life-threatening deterioration of the following conditions:  Circulatory failure  Critical care was time spent personally by me on the following activities:  Discussions with consultants, evaluation of patient's response to treatment, examination of patient, ordering and performing treatments and interventions, ordering and review of laboratory studies, ordering and review of radiographic studies, pulse oximetry, re-evaluation of patient's  condition, obtaining history from patient or surrogate and review of old charts      Critical Care performed: yes ____________________________________________   INITIAL IMPRESSION / [place] / ED COURSE  Pertinent labs & imaging results that were available during my care of the patient were reviewed by me and considered in my medical decision making (see chart for details).   DDX: GI bleed, anemia, hemolytic anemia, sepsis, dehydration  NICK FRONHEISER is a 64 y.o. who presents to the ED with presentation as described above. Patient very pale appearing soft blood pressures but mentating well. Suspect hemoglobin is can be significantly low. Have ordered some IV fluid while waiting blood work.  .prmon   Clinical Course as of 10/27/20 1527  Fri Oct 27, 2020  1412 Hemoglobin critically low at 2.7.  Have ordered blood transfusion.  Stool is weakly guaiac positive.  She is still mentating well.  Require admission.  Will start on Protonix infusion. [PR]  [id number] Blood transfusion being started.  Have discussed case with intensivist who agrees to evaluate patient at bedside and admit patient for further management. [PR]    Clinical Course User Index [PR] Merlyn Lot, MD    The patient was evaluated in Emergency Department today for the symptoms described in the history of present illness. He/she was evaluated in the context of the global COVID-19 pandemic, which necessitated consideration that the patient might be at risk for infection with the SARS-CoV-2 virus that causes COVID-19. Institutional protocols and algorithms that pertain to the evaluation of patients at risk for COVID-19 are in a state of rapid change based on information released by regulatory bodies including the CDC and federal and state organizations. These policies and algorithms were followed during the patient's care in the ED.  As part of my medical decision making, I reviewed the following data within the  [facility] notes reviewed and incorporated, Labs reviewed, notes from prior ED visits and [place] Controlled Substance Database   ____________________________________________   FINAL CLINICAL IMPRESSION(S) / ED DIAGNOSES  Final diagnoses:  Symptomatic anemia      NEW MEDICATIONS STARTED DURING THIS VISIT:  New Prescriptions   No medications on file     Note:  This document was prepared using Dragon voice recognition software and may include unintentional dictation errors.    Merlyn Lot, MD 10/27/20 [phone]

## 2020-10-27 NOTE — ED Notes (Addendum)
See triage note, pt reports weakness x1 week. Was told she was dehydrated then had labs yesterday and was told her hgb was low, cannot give number.  Pt wears 2 L [place] at home  Alert and oriented Pt appears very pale

## 2020-10-27 NOTE — H&P (Signed)
CRITICAL CARE PROGRESS NOTE    Name: Courtney Anderson MRN: [id number] DOB: 1956/09/22     LOS: 0   SUBJECTIVE FINDINGS & SIGNIFICANT EVENTS    Patient description:   64yo F w PMH as listed below, notable hx of MICU admission in 07/2020 with septic shock COVID19 needing vasopressor support, noted at that time to have numerous open skin wounds of torso, pelvis , intertriginous areas,complicated by devt of AFrVR was placed on xarelto on outpatient. Grandson who accompanied patient in ED during my evaluation shares patient is very pale, and patient states she noted over past 1-2wk BP has been decreasing so she did not need to take her antihypertensives.  She denied seeing blood in stool.  She was found to be in hemorrhagic shock with Hb 2.7. FOBT + stool.  MICU admission requested for management.     Lines/tubes : External Urinary Catheter (Active)    Microbiology/Sepsis markers: Results for orders placed or performed during the hospital encounter of 07/24/20  Urine culture     Status: Abnormal   Collection Time: 07/24/20  9:12 AM   Specimen: Urine, Random  Result Value Ref Range Status   Specimen Description   Final    URINE, RANDOM Performed at [facility], [address], [place], [place] [address]    Special Requests   Final    NONE Performed at [facility], [address], [place], [place] [address]    Culture (A)  Final    <10,000 COLONIES/mL >=100,000 COLONIES/mL Performed at [facility], [place] [address] [place], [place] [address]    Report Status 07/25/2020 FINAL  Final  Blood culture (routine single)     Status: None   Collection Time: 07/24/20  9:14 AM   Specimen: BLOOD LEFT ARM  Result Value Ref Range Status   Specimen Description BLOOD LEFT ARM  Final   Special  Requests   Final    BOTTLES DRAWN AEROBIC AND ANAEROBIC Blood Culture adequate volume   Culture   Final    NO GROWTH 5 DAYS Performed at [facility], [address] [place], [place] [address]    Report Status 07/29/2020 FINAL  Final  SARS Coronavirus 2 by RT PCR (hospital order, performed in [place] hospital lab) Nasopharyngeal Nasopharyngeal Swab     Status: Abnormal   Collection Time: 07/24/20  9:16 AM   Specimen: Nasopharyngeal Swab  Result Value Ref Range Status   SARS Coronavirus 2 POSITIVE (A) NEGATIVE Final    Comment: RESULT CALLED TO, READ BACK BY AND VERIFIED WITH:  SAMANTHA HAMILTON AT [id number] 07/24/20 SDR (NOTE) SARS-CoV-2 target nucleic acids are DETECTED  SARS-CoV-2 RNA is generally detectable in upper respiratory specimens  during the acute phase of infection.  Positive results are indicative  of the presence of the identified virus, but do not rule out bacterial infection or co-infection with other pathogens not detected by the test.  Clinical correlation with patient history and  other diagnostic information is necessary to determine patient infection status.  The expected result is negative.  Fact Sheet for Patients:   StrictlyIdeas.no   Fact Sheet for Healthcare Providers:   BankingDealers.co.za    This test is not yet approved or cleared by the [place] FDA and  has been authorized for detection and/or diagnosis of SARS-CoV-2 by FDA under an Emergency Use Authorization (EUA).  This EUA will remain in effect (meaning thi s test can be used) for the duration of  the COVID-19 declaration under Section 564(b)(1) of the Act, 21 U.S.C. section 360-bbb-3(b)(1), unless the authorization is terminated or revoked sooner.  Performed at [facility], [address] [place], [place] [address]   MRSA PCR Screening     Status: None   Collection Time: 07/30/20 11:08 AM   Specimen: Nasopharyngeal   Result Value Ref Range Status   MRSA by PCR NEGATIVE NEGATIVE Final    Comment:        The GeneXpert MRSA Assay (FDA approved for NASAL specimens only), is one component of a comprehensive MRSA colonization surveillance program. It is not intended to diagnose MRSA infection nor to guide or monitor treatment for MRSA infections. Performed at [facility], [address] [place], [place] [address]   Culture, respiratory (non-expectorated)     Status: None   Collection Time: 08/01/20 10:46 AM   Specimen: Tracheal Aspirate; Respiratory  Result Value Ref Range Status   Specimen Description   Final    TRACHEAL ASPIRATE Performed at [facility], [address], [place], [place] [address]    Special Requests   Final    NONE Performed at [facility], [place], [place] [address]    Gram Stain NO WBC SEEN NO ORGANISMS SEEN   Final   Culture   Final    RARE Normal respiratory flora-no Staph aureus or Pseudomonas seen Performed at [place] [address] [place], [place] [address]    Report Status 08/03/2020 FINAL  Final    Anti-infectives:  Anti-infectives (From admission, onward)   None       Consults:   GI consult  PAST MEDICAL HISTORY   Past Medical History:  Diagnosis Date  . Allergy   . COVID-19   . Elevated transaminase level   . Hyperlipidemia   . Hypertension   . Obesity      SURGICAL HISTORY   Past Surgical History:  Procedure Laterality Date  . BREAST SURGERY    . ECTOPIC PREGNANCY SURGERY       FAMILY HISTORY   Family History  Problem Relation Age of Onset  . Hypertension Father   . Stroke Father   . Hypertension Mother   . Arthritis Mother   . Aneurysm Sister   . Hypertension Sister   . Hypertension Brother   . Hypertension Sister   . Hypertension Brother      SOCIAL HISTORY   Social History   Tobacco Use  . Smoking status: Former Research scientist (life sciences)  . Smokeless tobacco: Never  Used  Substance Use Topics  . Alcohol use: Yes    Alcohol/week: 0.0 standard drinks    Comment: on occasion  . Drug use: No     MEDICATIONS   Current Medication:  Current Facility-Administered Medications:  .  0.9 %  sodium chloride infusion (Manually program via Guardrails IV Fluids), , Intravenous, Once, Ottie Glazier, MD .  docusate sodium (COLACE) capsule 100 mg, 100 mg, Oral, BID PRN, Ottie Glazier, MD .  pantoprazole (PROTONIX) injection 40 mg, 40 mg, Intravenous, QHS, Creedence Heiss, MD .  polyethylene glycol (MIRALAX / GLYCOLAX) packet 17 g, 17 g, Oral, Daily PRN, Ottie Glazier, MD  Current Outpatient Medications:  .  amiodarone (PACERONE) 200 MG tablet, Take 1 tablet (200 mg total) by mouth daily., Disp: 90 tablet, Rfl: 4 .  aspirin 81 MG tablet, Take 81 mg by mouth daily., Disp: , Rfl:  .  Cholecalciferol (VITAMIN D) 50 MCG (2000 UT)  tablet, Take 2,000 Units by mouth daily., Disp: , Rfl:  .  Multiple Vitamin (MULTIVITAMIN) tablet, Take 1 tablet by mouth daily., Disp: , Rfl:  .  olmesartan (BENICAR) 20 MG tablet, Take 1 tablet (20 mg total) by mouth daily., Disp: 90 tablet, Rfl: 0 .  rivaroxaban (XARELTO) 20 MG TABS tablet, Take 1 tablet (20 mg total) by mouth daily with supper., Disp: 30 tablet, Rfl: 5    ALLERGIES   Codeine    REVIEW OF SYSTEMS     10 point ROS negative except weakness fatigue.  PHYSICAL EXAMINATION   Vital Signs: Temp:  [97.6 F (36.4 C)-97.9 F (36.6 C)] 97.6 F (36.4 C) (12/24 1519) Pulse Rate:  [69-87] 69 (12/24 1519) Resp:  [16-25] 22 (12/24 1519) BP: (74-117)/(35-64) 97/64 (12/24 1519) SpO2:  [91 %-100 %] 100 % (12/24 1519)  GENERAL:age appropriate HEAD: Normocephalic, atraumatic.  EYES: Pupils equal, round, reactive to light.  No scleral icterus.  MOUTH: Moist mucosal membrane. NECK: Supple. No thyromegaly. No nodules. No JVD.  PULMONARY: CTAB CARDIOVASCULAR: S1 and S2. Regular rate and rhythm. No murmurs, rubs, or  gallops.  GASTROINTESTINAL: Soft, nontender, non-distended. No masses. Positive bowel sounds. No hepatosplenomegaly.  MUSCULOSKELETAL: No swelling, clubbing, or edema.  NEUROLOGIC: Mild distress due to acute illness SKIN:intact,warm,dry   PERTINENT DATA     Infusions:  Scheduled Medications: . sodium chloride   Intravenous Once  . pantoprazole (PROTONIX) IV  40 mg Intravenous QHS   PRN Medications: docusate sodium, polyethylene glycol Hemodynamic parameters:   Intake/Output: No intake/output data recorded.  Ventilator  Settings:   LAB RESULTS:  Basic Metabolic Panel: Recent Labs  Lab 10/27/20 1244  NA 136  K 4.5  CL 105  CO2 20*  GLUCOSE 155*  BUN 15  CREATININE 1.09*  CALCIUM 9.7   Liver Function Tests: Recent Labs  Lab 10/27/20 1244  AST 19  ALT 8  ALKPHOS 42  BILITOT 0.6  PROT 6.0*  ALBUMIN 2.9*   No results for input(s): LIPASE, AMYLASE in the last 168 hours. No results for input(s): AMMONIA in the last 168 hours. CBC: Recent Labs  Lab 10/27/20 1244  WBC 16.2*  NEUTROABS 10.0*  HGB 2.7*  HCT 10.1*  MCV 78.3*  PLT 429*   Cardiac Enzymes: No results for input(s): CKTOTAL, CKMB, CKMBINDEX, TROPONINI in the last 168 hours. BNP: Invalid input(s): POCBNP CBG: No results for input(s): GLUCAP in the last 168 hours.     IMAGING RESULTS:  Imaging: DG Chest Portable 1 View  Result Date: 10/27/2020 CLINICAL DATA:  Weakness EXAM: PORTABLE CHEST 1 VIEW COMPARISON:  08/18/2020 FINDINGS: Interval removal of left IJ central line. Stable cardiomegaly. Persistent diffuse interstitial prominence with streaky bibasilar airspace opacities, right greater than left. Overall lung aeration has improved compared to the previous study. No pleural effusion or pneumothorax. IMPRESSION: Improved lung aeration with persistent diffuse interstitial prominence and streaky bibasilar airspace opacities, right greater than left. Electronically Signed   By: Davina Poke D.O.   On: 10/27/2020 14:28   @PROBHOSP @ DG Chest Portable 1 View  Result Date: 10/27/2020 CLINICAL DATA:  Weakness EXAM: PORTABLE CHEST 1 VIEW COMPARISON:  08/18/2020 FINDINGS: Interval removal of left IJ central line. Stable cardiomegaly. Persistent diffuse interstitial prominence with streaky bibasilar airspace opacities, right greater than left. Overall lung aeration has improved compared to the previous study. No pleural effusion or pneumothorax. IMPRESSION: Improved lung aeration with persistent diffuse interstitial prominence and streaky bibasilar airspace opacities, right greater than left. Electronically Signed  By: Davina Poke D.O.   On: 10/27/2020 14:28     ASSESSMENT AND PLAN    -Multidisciplinary rounds held today  Hemorrhagic shock Present on admission-due to lower GI bleed secondary to anticoagulation with Xarelto - Hb 2.7 -BP 73/34 -Transfuse blood product to goal Hb>7 with addition of FFP for dilutional coagulopathy.  May hold Kaycentra for now until recheck h/h  -GI consult - no hx of portal hypertensive dz or varices.  S/p PPI gtt>>protonix IV     Acute blood loss anemia   - due to GI bleed from anticoagulation    - GI consult    - replete blood products    - monitor h/h     - ensure adequate  ICU monitoring    S/p COVID19 with septic shock   - blood cultures     - currently respiratory status is stable    Multiple open skin wounds    Present on admission    - wound care consultation   ID -continue IV abx as prescibed -follow up cultures  GI/Nutrition GI PROPHYLAXIS as indicated DIET-->TF's as tolerated Constipation protocol as indicated  ENDO - ICU hypoglycemic\Hyperglycemia protocol -check FSBS per protocol   ELECTROLYTES -follow labs as needed -replace as needed -pharmacy consultation   DVT/GI PRX ordered -SCDs  TRANSFUSIONS AS NEEDED MONITOR FSBS ASSESS the need for LABS as needed   Critical care provider  statement:    Critical care time (minutes):  34   Critical care time was exclusive of:  Separately billable procedures and treating other patients   Critical care was necessary to treat or prevent imminent or life-threatening deterioration of the following conditions:  hemorrhagic shock, acute blood loss anemia, multiple comorbid conditions.    Critical care was time spent personally by me on the following activities:  Development of treatment plan with patient or surrogate, discussions with consultants, evaluation of patient's response to treatment, examination of patient, obtaining history from patient or surrogate, ordering and performing treatments and interventions, ordering and review of laboratory studies and re-evaluation of patient's condition.  I assumed direction of critical care for this patient from another provider in my specialty: no    This document was prepared using Dragon voice recognition software and may include unintentional dictation errors.    Ottie Glazier, M.D.  Division of [place]

## 2020-10-28 DIAGNOSIS — D649 Anemia, unspecified: Secondary | ICD-10-CM

## 2020-10-28 LAB — BPAM FFP
Blood Product Expiration Date: 202112292359
Blood Product Expiration Date: 202112292359
ISSUE DATE / TIME: 202112241607
ISSUE DATE / TIME: 202112241717
Unit Type and Rh: 9500
Unit Type and Rh: 9500

## 2020-10-28 LAB — CBC
HCT: 23.8 % — ABNORMAL LOW (ref 36.0–46.0)
Hemoglobin: 8.2 g/dL — ABNORMAL LOW (ref 12.0–15.0)
MCH: 28.8 pg (ref 26.0–34.0)
MCHC: 34.5 g/dL (ref 30.0–36.0)
MCV: 83.5 fL (ref 80.0–100.0)
Platelets: 274 10*3/uL (ref 150–400)
RBC: 2.85 MIL/uL — ABNORMAL LOW (ref 3.87–5.11)
RDW: 15.7 % — ABNORMAL HIGH (ref 11.5–15.5)
WBC: 9.3 10*3/uL (ref 4.0–10.5)
nRBC: 2 % — ABNORMAL HIGH (ref 0.0–0.2)

## 2020-10-28 LAB — PREPARE FRESH FROZEN PLASMA
Unit division: 0
Unit division: 0

## 2020-10-28 LAB — BASIC METABOLIC PANEL
Anion gap: 7 (ref 5–15)
BUN: 11 mg/dL (ref 8–23)
CO2: 22 mmol/L (ref 22–32)
Calcium: 9 mg/dL (ref 8.9–10.3)
Chloride: 109 mmol/L (ref 98–111)
Creatinine, Ser: 0.82 mg/dL (ref 0.44–1.00)
GFR, Estimated: >60 mL/min (ref >60)
Glucose, Bld: 89 mg/dL (ref 70–99)
Potassium: 3.9 mmol/L (ref 3.5–5.1)
Sodium: 138 mmol/L (ref 135–145)

## 2020-10-28 LAB — HEMOGLOBIN AND HEMATOCRIT, BLOOD
HCT: 22.1 % — ABNORMAL LOW (ref 36.0–46.0)
Hemoglobin: 7.4 g/dL — ABNORMAL LOW (ref 12.0–15.0)

## 2020-10-28 LAB — PHOSPHORUS: Phosphorus: 2.9 mg/dL (ref 2.5–4.6)

## 2020-10-28 LAB — MAGNESIUM: Magnesium: 2 mg/dL (ref 1.7–2.4)

## 2020-10-28 MED ORDER — SODIUM CHLORIDE 0.9 % IV SOLN: INTRAVENOUS | Status: DC

## 2020-10-28 MED ORDER — MAGNESIUM CITRATE PO SOLN
1.0000 | Freq: Once | ORAL | Status: AC
  Administered 2020-10-28: 1 via ORAL
  Filled 2020-10-28: qty 296

## 2020-10-28 MED ORDER — POLYETHYLENE GLYCOL 3350 17 GM/SCOOP PO POWD
1.0000 | Freq: Once | ORAL | Status: AC
  Administered 2020-10-28: 255 g via ORAL
  Filled 2020-10-28: qty 255

## 2020-10-28 MED ORDER — ACETAMINOPHEN 325 MG PO TABS
650.0000 mg | ORAL_TABLET | Freq: Four times a day (QID) | ORAL | Status: DC | PRN
  Administered 2020-10-28: 650 mg via ORAL

## 2020-10-28 NOTE — H&P (Signed)
CRITICAL CARE PROGRESS NOTE    Name: Courtney Anderson MRN: [id number] DOB: August 07, 1956     LOS: 1   SUBJECTIVE FINDINGS & SIGNIFICANT EVENTS    Patient description:   64yo F w PMH as listed below, notable hx of MICU admission in 07/2020 with septic shock COVID19 needing vasopressor support, noted at that time to have numerous open skin wounds of torso, pelvis , intertriginous areas,complicated by devt of AFrVR was placed on xarelto on outpatient. Grandson who accompanied patient in ED during my evaluation shares patient is very pale, and patient states she noted over past 1-2wk BP has been decreasing so she did not need to take her antihypertensives.  She denied seeing blood in stool.  She was found to be in hemorrhagic shock with Hb 2.7. FOBT + stool.  MICU admission requested for management.   10/28/20- patient clinically improved. Plan for GI scoping. Cardiology consult placed per GI request for clearance to have EGD.  Lines/tubes : External Urinary Catheter (Active)    Microbiology/Sepsis markers: Results for orders placed or performed during the hospital encounter of 10/27/20  Resp Panel by RT-PCR (Flu A&B, Covid) Nasopharyngeal Swab     Status: None   Collection Time: 10/27/20 12:45 PM   Specimen: Nasopharyngeal Swab; Nasopharyngeal(NP) swabs in vial transport medium  Result Value Ref Range Status   SARS Coronavirus 2 by RT PCR NEGATIVE NEGATIVE Final    Comment: (NOTE) SARS-CoV-2 target nucleic acids are NOT DETECTED.  The SARS-CoV-2 RNA is generally detectable in upper respiratory specimens during the acute phase of infection. The lowest concentration of SARS-CoV-2 viral copies this assay can detect is 138 copies/mL. A negative result does not preclude SARS-Cov-2 infection and should not be used as the  sole basis for treatment or other patient management decisions. A negative result may occur with  improper specimen collection/handling, submission of specimen other than nasopharyngeal swab, presence of viral mutation(s) within the areas targeted by this assay, and inadequate number of viral copies(<138 copies/mL). A negative result must be combined with clinical observations, patient history, and epidemiological information. The expected result is Negative.  Fact Sheet for Patients:  EntrepreneurPulse.com.au  Fact Sheet for Healthcare Providers:  IncredibleEmployment.be  This test is no t yet approved or cleared by the [place] FDA and  has been authorized for detection and/or diagnosis of SARS-CoV-2 by FDA under an Emergency Use Authorization (EUA). This EUA will remain  in effect (meaning this test can be used) for the duration of the COVID-19 declaration under Section 564(b)(1) of the Act, 21 U.S.C.section 360bbb-3(b)(1), unless the authorization is terminated  or revoked sooner.       Influenza A by PCR NEGATIVE NEGATIVE Final   Influenza B by PCR NEGATIVE NEGATIVE Final    Comment: (NOTE) The Xpert Xpress SARS-CoV-2/FLU/RSV plus assay is intended as an aid in the diagnosis of influenza from Nasopharyngeal swab specimens and should not be used as a sole basis for treatment. Nasal washings and aspirates are unacceptable for Xpert Xpress SARS-CoV-2/FLU/RSV testing.  Fact Sheet for Patients: EntrepreneurPulse.com.au  Fact Sheet for Healthcare Providers: IncredibleEmployment.be  This test is not yet approved or cleared by the [place] FDA and has been authorized for detection and/or diagnosis of SARS-CoV-2 by FDA under an Emergency Use Authorization (EUA). This EUA will remain in effect (meaning this test can be used) for the duration of the COVID-19 declaration under Section 564(b)(1) of the  Act, 21 U.S.C. section 360bbb-3(b)(1), unless the authorization is terminated  or revoked.  Performed at [facility], [address] [place], [place] [address]   Culture, blood (routine x 2)     Status: None (Preliminary result)   Collection Time: 10/27/20  2:58 PM   Specimen: BLOOD  Result Value Ref Range Status   Specimen Description BLOOD RIGHT ANTECUBITAL  Final   Special Requests   Final    BOTTLES DRAWN AEROBIC AND ANAEROBIC Blood Culture adequate volume   Culture   Final    NO GROWTH < 24 HOURS Performed at [facility], [address], [place], [place] [address]    Report Status PENDING  Incomplete  Culture, blood (routine x 2)     Status: None (Preliminary result)   Collection Time: 10/27/20  3:03 PM   Specimen: BLOOD  Result Value Ref Range Status   Specimen Description BLOOD LEFT ANTECUBITAL  Final   Special Requests   Final    BOTTLES DRAWN AEROBIC AND ANAEROBIC Blood Culture adequate volume   Culture   Final    NO GROWTH < 24 HOURS Performed at [facility], [address], [place], [place] [address]    Report Status PENDING  Incomplete  MRSA PCR Screening     Status: None   Collection Time: 10/27/20  3:53 PM   Specimen: Nasopharyngeal  Result Value Ref Range Status   MRSA by PCR NEGATIVE NEGATIVE Final    Comment:        The GeneXpert MRSA Assay (FDA approved for NASAL specimens only), is one component of a comprehensive MRSA colonization surveillance program. It is not intended to diagnose MRSA infection nor to guide or monitor treatment for MRSA infections. Performed at [facility], [address] [place], [place] [address]     Anti-infectives:  Anti-infectives (From admission, onward)   None       Consults: Treatment Team:  Lin Landsman, MD  GI consult  PAST MEDICAL HISTORY   Past Medical History:  Diagnosis Date  . Allergy   . COVID-19   . Elevated transaminase level   .  Hyperlipidemia   . Hypertension   . Obesity      SURGICAL HISTORY   Past Surgical History:  Procedure Laterality Date  . BREAST SURGERY    . ECTOPIC PREGNANCY SURGERY       FAMILY HISTORY   Family History  Problem Relation Age of Onset  . Hypertension Father   . Stroke Father   . Hypertension Mother   . Arthritis Mother   . Aneurysm Sister   . Hypertension Sister   . Hypertension Brother   . Hypertension Sister   . Hypertension Brother      SOCIAL HISTORY   Social History   Tobacco Use  . Smoking status: Former Research scientist (life sciences)  . Smokeless tobacco: Never Used  Substance Use Topics  . Alcohol use: Yes    Alcohol/week: 0.0 standard drinks    Comment: on occasion  . Drug use: No     MEDICATIONS   Current Medication:  Current Facility-Administered Medications:  .  0.9 %  sodium chloride infusion (Manually program via Guardrails IV Fluids), , Intravenous, Once, Abigail Teall, MD .  0.9 %  sodium chloride infusion, , Intravenous, Continuous, Vanga, Tally Due, MD .  acetaminophen (TYLENOL) tablet 650 mg, 650 mg, Oral, Q6H PRN, Awilda Bill, NP, 650 mg at 10/28/20 0504 .  Chlorhexidine Gluconate Cloth 2 % PADS 6 each, 6 each, Topical, Q0600, Ottie Glazier, MD, 6 each  at 10/28/20 1135 .  docusate sodium (COLACE) capsule 100 mg, 100 mg, Oral, BID PRN, Ottie Glazier, MD .  influenza vaccine adjuvanted (FLUAD) injection 0.5 mL, 0.5 mL, Intramuscular, Tomorrow-1000, Valerye Kobus, MD .  pantoprazole (PROTONIX) injection 40 mg, 40 mg, Intravenous, QHS, Rafan Sanders, MD, 40 mg at 10/27/20 2131 .  polyethylene glycol (MIRALAX / GLYCOLAX) packet 17 g, 17 g, Oral, Daily PRN, Ottie Glazier, MD    ALLERGIES   Codeine    REVIEW OF SYSTEMS     10 point ROS negative except weakness fatigue.  PHYSICAL EXAMINATION   Vital Signs: Temp:  [96.2 F (35.7 C)-98.1 F (36.7 C)] 98.1 F (36.7 C) (12/25 0400) Pulse Rate:  [62-76] 62 (12/25 0900) Resp:   [14-27] 16 (12/25 0900) BP: (74-126)/(29-68) 114/63 (12/25 0900) SpO2:  [97 %-100 %] 99 % (12/25 0900) Weight:  [99.8 kg-99.9 kg] 99.9 kg (12/25 0354)  GENERAL:age appropriate HEAD: Normocephalic, atraumatic.  EYES: Pupils equal, round, reactive to light.  No scleral icterus.  MOUTH: Moist mucosal membrane. NECK: Supple. No thyromegaly. No nodules. No JVD.  PULMONARY: CTAB CARDIOVASCULAR: S1 and S2. Regular rate and rhythm. No murmurs, rubs, or gallops.  GASTROINTESTINAL: Soft, nontender, non-distended. No masses. Positive bowel sounds. No hepatosplenomegaly.  MUSCULOSKELETAL: No swelling, clubbing, or edema.  NEUROLOGIC: Mild distress due to acute illness SKIN:intact,warm,dry   PERTINENT DATA     Infusions: . sodium chloride     Scheduled Medications: . sodium chloride   Intravenous Once  . Chlorhexidine Gluconate Cloth  6 each Topical Q0600  . influenza vaccine adjuvanted  0.5 mL Intramuscular Tomorrow-1000  . pantoprazole (PROTONIX) IV  40 mg Intravenous QHS   PRN Medications: acetaminophen, docusate sodium, polyethylene glycol Hemodynamic parameters:   Intake/Output: 12/24 0701 - 12/25 0700 In: 4284.5 [I.V.:2299.6; Blood:1884.9; IV Piggyback:100] Out: 3100 [id number]  Ventilator  Settings:   LAB RESULTS:  Basic Metabolic Panel: Recent Labs  Lab 10/27/20 1244 10/28/20 0138  NA 136 138  K 4.5 3.9  CL 105 109  CO2 20* 22  GLUCOSE 155* 89  BUN 15 11  CREATININE 1.09* 0.82  CALCIUM 9.7 9.0  MG  --  2.0  PHOS  --  2.9   Liver Function Tests: Recent Labs  Lab 10/27/20 1244  AST 19  ALT 8  ALKPHOS 42  BILITOT 0.6  PROT 6.0*  ALBUMIN 2.9*   No results for input(s): LIPASE, AMYLASE in the last 168 hours. No results for input(s): AMMONIA in the last 168 hours. CBC: Recent Labs  Lab 10/27/20 1244 10/27/20 1457 10/27/20 1945 10/28/20 0138 10/28/20 0523  WBC 16.2* 13.2* 12.1*  --  9.3  NEUTROABS 10.0* 11.3* 9.7*  --   --   HGB 2.7* 2.3* 5.1*  7.4* 8.2*  HCT 10.1* 8.6* 16.7* 22.1* 23.8*  MCV 78.3* 80.4 85.6  --  83.5  PLT 429* 344 291  --  274   Cardiac Enzymes: No results for input(s): CKTOTAL, CKMB, CKMBINDEX, TROPONINI in the last 168 hours. BNP: Invalid input(s): POCBNP CBG: No results for input(s): GLUCAP in the last 168 hours.     IMAGING RESULTS:  Imaging: DG Chest Portable 1 View  Result Date: 10/27/2020 CLINICAL DATA:  Weakness EXAM: PORTABLE CHEST 1 VIEW COMPARISON:  08/18/2020 FINDINGS: Interval removal of left IJ central line. Stable cardiomegaly. Persistent diffuse interstitial prominence with streaky bibasilar airspace opacities, right greater than left. Overall lung aeration has improved compared to the previous study. No pleural effusion or pneumothorax. IMPRESSION: Improved lung aeration  with persistent diffuse interstitial prominence and streaky bibasilar airspace opacities, right greater than left. Electronically Signed   By: Davina Poke D.O.   On: 10/27/2020 14:28   @PROBHOSP @ DG Chest Portable 1 View  Result Date: 10/27/2020 CLINICAL DATA:  Weakness EXAM: PORTABLE CHEST 1 VIEW COMPARISON:  08/18/2020 FINDINGS: Interval removal of left IJ central line. Stable cardiomegaly. Persistent diffuse interstitial prominence with streaky bibasilar airspace opacities, right greater than left. Overall lung aeration has improved compared to the previous study. No pleural effusion or pneumothorax. IMPRESSION: Improved lung aeration with persistent diffuse interstitial prominence and streaky bibasilar airspace opacities, right greater than left. Electronically Signed   By: Davina Poke D.O.   On: 10/27/2020 14:28     ASSESSMENT AND PLAN    -Multidisciplinary rounds held today  Hemorrhagic shock-resolved Present on admission-due to lower GI bleed secondary to anticoagulation with Xarelto - Hb 2.7 -BP 73/34 -Transfuse blood product to goal Hb>7 with addition of FFP for dilutional coagulopathy.  May hold  Kaycentra for now until recheck h/h  -GI consult - no hx of portal hypertensive dz or varices.  S/p PPI gtt>>protonix IV     Acute blood loss anemia   - due to GI bleed from anticoagulation    - GI consult    - replete blood products    - monitor h/h     - ensure adequate  ICU monitoring           GI on case - Dr Marius Ditch - egd/cscope   S/p [place] with septic shock   - blood cultures     - currently respiratory status is stable    Multiple open skin wounds    Present on admission    - wound care consultation   ID -continue IV abx as prescibed -follow up cultures  GI/Nutrition GI PROPHYLAXIS as indicated DIET-->TF's as tolerated Constipation protocol as indicated  ENDO - ICU hypoglycemic\Hyperglycemia protocol -check FSBS per protocol   ELECTROLYTES -follow labs as needed -replace as needed -pharmacy consultation   DVT/GI PRX ordered -SCDs  TRANSFUSIONS AS NEEDED MONITOR FSBS ASSESS the need for LABS as needed   Critical care provider statement:    Critical care time (minutes):  34   Critical care time was exclusive of:  Separately billable procedures and treating other patients   Critical care was necessary to treat or prevent imminent or life-threatening deterioration of the following conditions:  hemorrhagic shock, acute blood loss anemia, multiple comorbid conditions.    Critical care was time spent personally by me on the following activities:  Development of treatment plan with patient or surrogate, discussions with consultants, evaluation of patient's response to treatment, examination of patient, obtaining history from patient or surrogate, ordering and performing treatments and interventions, ordering and review of laboratory studies and re-evaluation of patient's condition.  I assumed direction of critical care for this patient from another provider in my specialty: no    This document was prepared using Dragon voice recognition software and may  include unintentional dictation errors.    Ottie Glazier, M.D.  Division of [place]

## 2020-10-28 NOTE — Progress Notes (Signed)
Good day after patient realized she could not go home . Dr. Marius Ditch in this am to discuss EGD and Colonoscopy tomorrow? Drank pre procedure Mag.Citrate and Miralax container with great difficulty ie she didn't care for it. Refused most of her clear liquid diet today. Stated she did not care for institutional food. As of 1730 Has only had 2 large stools. !st 1 was black. 2nd one was browner in color.

## 2020-10-28 NOTE — Consult Note (Signed)
Cephas Darby, MD [address]  [place]  [place], [place] [address]  Main: [phone]  Fax: [phone] Pager: [phone]   Consultation  Referring Provider:     No ref. provider found Primary Care Physician:  System, Provider Not In Primary Gastroenterologist: None         Reason for Consultation:     Severe symptomatic anemia  Date of Admission:  10/27/2020 Date of Consultation:  10/28/2020         HPI:   Courtney Anderson is a 65 y.o. female with history of A. fib on Xarelto, obesity, hypertension, hyperlipidemia presented with severe symptomatic anemia.  Patient was admitted in October 2021 at [facility] secondary to Covid pneumonia, respiratory failure, intubation s/p extubation complicated by A. fib with RVR started on amiodarone and Eliquis.  She presents with progressively worsening shortness of breath and fatigue, was found to have low hemoglobin based on the labs drawn by home health visit.  She denies any witnessed episodes of melena, rectal bleeding.  She denies abdominal pain, nausea or vomiting.  Her last dose of Xarelto was on 12/24 AM Patient received several blood transfusions in the ICU, responded appropriately, her hemoglobin is 8.2 today, normal MCV.  Improved from 2.3 on admission She reports feeling much better, is hemodynamically stable.  She denies chest pain, shortness of breath or lightheadedness.  She is kept n.p.o. and started on pantoprazole drip  She does not smoke or drink alcohol NSAIDs: None  Antiplts/Anticoagulants/Anti thrombotics: Xarelto for A. fib  GI Procedures: None She denies any abdominal surgeries  Past Medical History:  Diagnosis Date  . Allergy   . COVID-19   . Elevated transaminase level   . Hyperlipidemia   . Hypertension   . Obesity     Past Surgical History:  Procedure Laterality Date  . BREAST SURGERY    . ECTOPIC PREGNANCY SURGERY      Current Facility-Administered Medications:  .  0.9 %  sodium chloride  infusion (Manually program via Guardrails IV Fluids), , Intravenous, Once, Aleskerov, Fuad, MD .  0.9 %  sodium chloride infusion, , Intravenous, Continuous, Saylee Sherrill, Tally Due, MD .  acetaminophen (TYLENOL) tablet 650 mg, 650 mg, Oral, Q6H PRN, Awilda Bill, NP, 650 mg at 10/28/20 0504 .  Chlorhexidine Gluconate Cloth 2 % PADS 6 each, 6 each, Topical, Q0600, Ottie Glazier, MD .  docusate sodium (COLACE) capsule 100 mg, 100 mg, Oral, BID PRN, Lanney Gins, Fuad, MD .  influenza vaccine adjuvanted (FLUAD) injection 0.5 mL, 0.5 mL, Intramuscular, Tomorrow-1000, Aleskerov, Fuad, MD .  magnesium citrate solution 1 Bottle, 1 Bottle, Oral, Once, Kyngston Pickelsimer, Tally Due, MD .  pantoprazole (PROTONIX) injection 40 mg, 40 mg, Intravenous, QHS, Aleskerov, Fuad, MD, 40 mg at 10/27/20 2131 .  polyethylene glycol (MIRALAX / GLYCOLAX) packet 17 g, 17 g, Oral, Daily PRN, Lanney Gins, Fuad, MD .  polyethylene glycol powder (GLYCOLAX/MIRALAX) container 255 g, 1 Container, Oral, Once, Zaylan Kissoon, Tally Due, MD   Family History  Problem Relation Age of Onset  . Hypertension Father   . Stroke Father   . Hypertension Mother   . Arthritis Mother   . Aneurysm Sister   . Hypertension Sister   . Hypertension Brother   . Hypertension Sister   . Hypertension Brother      Social History   Tobacco Use  . Smoking status: Former Research scientist (life sciences)  . Smokeless tobacco: Never Used  Substance Use Topics  . Alcohol use: Yes  Alcohol/week: 0.0 standard drinks    Comment: on occasion  . Drug use: No    Allergies as of 10/27/2020 - Review Complete 10/27/2020  Allergen Reaction Noted  . Codeine  05/31/2015    Review of Systems:    All systems reviewed and negative except where noted in HPI.   Physical Exam:  Vital signs in last 24 hours: Temp:  [96.2 F (35.7 C)-98.1 F (36.7 C)] 98.1 F (36.7 C) (12/25 0400) Pulse Rate:  [62-87] 62 (12/25 0900) Resp:  [14-27] 16 (12/25 0900) BP: (74-126)/(29-68) 114/63 (12/25  0900) SpO2:  [91 %-100 %] 99 % (12/25 0900) Weight:  [99.8 kg-99.9 kg] 99.9 kg (12/25 0354) Last BM Date:  (PTA) General:   Pleasant, cooperative in NAD Head:  Normocephalic and atraumatic. Eyes:   No icterus.   Conjunctiva pink. PERRLA. Ears:  Normal auditory acuity. Neck:  Supple; no masses or thyroidomegaly Lungs: Respirations even and unlabored. Lungs clear to auscultation bilaterally.   No wheezes, crackles, or rhonchi.  Heart:  Regular rate and rhythm;  Without murmur, clicks, rubs or gallops Abdomen:  Soft, nondistended, nontender. Normal bowel sounds. No appreciable masses or hepatomegaly.  No rebound or guarding.  Rectal:  Not performed. Msk:  Symmetrical without gross deformities.  Strength generalized weakness Extremities:  Without edema, cyanosis or clubbing. Neurologic:  Alert and oriented x3;  grossly normal neurologically. Skin:  Intact without significant lesions or rashes. Cervical Nodes:  No significant cervical adenopathy. Psych:  Alert and cooperative. Normal affect.  LAB RESULTS: CBC Latest Ref Rng & Units 10/28/2020 10/28/2020 10/27/2020  WBC 4.0 - 10.5 K/uL 9.3 - 12.1(H)  Hemoglobin 12.0 - 15.0 g/dL 8.2(L) 7.4(L) 5.1(L)  Hematocrit 36.0 - 46.0 % 23.8(L) 22.1(L) 16.7(L)  Platelets 150 - 400 K/uL 274 - 291    BMET BMP Latest Ref Rng & Units 10/28/2020 10/27/2020 08/28/2020  Glucose 70 - 99 mg/dL 89 155(H) -  BUN 8 - 23 mg/dL 11 15 -  Creatinine 0.44 - 1.00 mg/dL 0.82 1.09(H) -  BUN/Creat Ratio 12 - 28 - - -  Sodium 135 - 145 mmol/L 138 136 -  Potassium 3.5 - 5.1 mmol/L 3.9 4.5 4.0  Chloride 98 - 111 mmol/L 109 105 -  CO2 22 - 32 mmol/L 22 20(L) -  Calcium 8.9 - 10.3 mg/dL 9.0 9.7 -    LFT Hepatic Function Latest Ref Rng & Units 10/27/2020 08/25/2020 08/21/2020  Total Protein 6.5 - 8.1 g/dL 6.0(L) 7.2 7.3  Albumin 3.5 - 5.0 g/dL 2.9(L) 3.1(L) 3.2(L)  AST 15 - 41 U/L '19 23 22  ' ALT 0 - 44 U/L '8 18 25  ' Alk Phosphatase 38 - 126 U/L 42 66 66  Total  Bilirubin 0.3 - 1.2 mg/dL 0.6 0.8 1.0  Bilirubin, Direct 0.0 - 0.2 mg/dL - 0.2 -     STUDIES: DG Chest Portable 1 View  Result Date: 10/27/2020 CLINICAL DATA:  Weakness EXAM: PORTABLE CHEST 1 VIEW COMPARISON:  08/18/2020 FINDINGS: Interval removal of left IJ central line. Stable cardiomegaly. Persistent diffuse interstitial prominence with streaky bibasilar airspace opacities, right greater than left. Overall lung aeration has improved compared to the previous study. No pleural effusion or pneumothorax. IMPRESSION: Improved lung aeration with persistent diffuse interstitial prominence and streaky bibasilar airspace opacities, right greater than left. Electronically Signed   By: Davina Poke D.O.   On: 10/27/2020 14:28      Impression / Plan:   Courtney Anderson is a 64 y.o. female with history of  hypertension, hyperlipidemia with Covid pneumonia, hypoxic respiratory failure in 10/21, s/p intubation and extubation, A. fib with RVR on Xarelto presented with severe symptomatic anemia with no evidence of active GI bleed  Severe anemia Continue to hold Xarelto Recommend upper endoscopy as well as colonoscopy as inpatient as patient prefers to get this done while she is here Recommend cardiology clearance Clear liquid diet today Okay to continue pantoprazole drip N.p.o. past midnight Bowel prep ordered, pending cardiology clearance  I have discussed my recommendations with patient as well as her son who are agreeable  I have discussed alternative options, risks & benefits,  which include, but are not limited to, bleeding, infection, perforation,respiratory complication & drug reaction.  The patient agrees with this plan & written consent will be obtained.     Thank you for involving me in the care of this patient.      LOS: 1 day   Sherri Sear, MD  10/28/2020, 9:22 AM   Note: This dictation was prepared with Dragon dictation along with smaller phrase technology. Any  transcriptional errors that result from this process are unintentional.

## 2020-10-29 ENCOUNTER — Encounter
Admission: EM | Disposition: A | Discharge status: Home Health | Payer: Self-pay | Source: Home / Self Care | Attending: Pulmonary Disease

## 2020-10-29 ENCOUNTER — Inpatient Hospital Stay: Payer: Medicare Other | Admitting: Registered Nurse

## 2020-10-29 ENCOUNTER — Encounter: Payer: Self-pay | Admitting: Pulmonary Disease

## 2020-10-29 DIAGNOSIS — D62 Acute posthemorrhagic anemia: Secondary | ICD-10-CM

## 2020-10-29 DIAGNOSIS — K635 Polyp of colon: Secondary | ICD-10-CM

## 2020-10-29 HISTORY — PX: COLONOSCOPY WITH PROPOFOL: SHX5780

## 2020-10-29 HISTORY — PX: ESOPHAGOGASTRODUODENOSCOPY (EGD) WITH PROPOFOL: SHX5813

## 2020-10-29 LAB — CBC
HCT: 25 % — ABNORMAL LOW (ref 36.0–46.0)
Hemoglobin: 8.1 g/dL — ABNORMAL LOW (ref 12.0–15.0)
MCH: 27.6 pg (ref 26.0–34.0)
MCHC: 32.4 g/dL (ref 30.0–36.0)
MCV: 85.3 fL (ref 80.0–100.0)
Platelets: 290 10*3/uL (ref 150–400)
RBC: 2.93 MIL/uL — ABNORMAL LOW (ref 3.87–5.11)
RDW: 16.1 % — ABNORMAL HIGH (ref 11.5–15.5)
WBC: 8.8 10*3/uL (ref 4.0–10.5)
nRBC: 1.3 % — ABNORMAL HIGH (ref 0.0–0.2)

## 2020-10-29 LAB — BASIC METABOLIC PANEL
Anion gap: 6 (ref 5–15)
BUN: 7 mg/dL — ABNORMAL LOW (ref 8–23)
CO2: 22 mmol/L (ref 22–32)
Calcium: 9 mg/dL (ref 8.9–10.3)
Chloride: 108 mmol/L (ref 98–111)
Creatinine, Ser: 0.94 mg/dL (ref 0.44–1.00)
GFR, Estimated: >60 mL/min (ref >60)
Glucose, Bld: 88 mg/dL (ref 70–99)
Potassium: 3.2 mmol/L — ABNORMAL LOW (ref 3.5–5.1)
Sodium: 136 mmol/L (ref 135–145)

## 2020-10-29 LAB — PHOSPHORUS: Phosphorus: 2.6 mg/dL (ref 2.5–4.6)

## 2020-10-29 LAB — MAGNESIUM: Magnesium: 2.1 mg/dL (ref 1.7–2.4)

## 2020-10-29 SURGERY — ESOPHAGOGASTRODUODENOSCOPY (EGD) WITH PROPOFOL
Anesthesia: General

## 2020-10-29 MED ORDER — LIDOCAINE HCL (CARDIAC) PF 100 MG/5ML IV SOSY
PREFILLED_SYRINGE | INTRAVENOUS | Status: DC | PRN
  Administered 2020-10-29: 40 mg via INTRAVENOUS

## 2020-10-29 MED ORDER — POTASSIUM CHLORIDE 10 MEQ/100ML IV SOLN
10.0000 meq | INTRAVENOUS | Status: AC
  Administered 2020-10-29: 10 meq via INTRAVENOUS
  Filled 2020-10-29 (×2): qty 100

## 2020-10-29 MED ORDER — PROPOFOL 10 MG/ML IV BOLUS
INTRAVENOUS | Status: DC | PRN
  Administered 2020-10-29 (×2): 10 mg via INTRAVENOUS
  Administered 2020-10-29: 80 mg via INTRAVENOUS

## 2020-10-29 MED ORDER — PROPOFOL 500 MG/50ML IV EMUL
INTRAVENOUS | Status: AC
  Filled 2020-10-29: qty 50

## 2020-10-29 MED ORDER — PROPOFOL 500 MG/50ML IV EMUL
INTRAVENOUS | Status: DC | PRN
  Administered 2020-10-29: 150 ug/kg/min via INTRAVENOUS

## 2020-10-29 MED ORDER — POTASSIUM CHLORIDE CRYS ER 20 MEQ PO TBCR
40.0000 meq | EXTENDED_RELEASE_TABLET | Freq: Once | ORAL | Status: AC
  Administered 2020-10-29: 40 meq via ORAL
  Filled 2020-10-29: qty 2

## 2020-10-29 MED ORDER — EPHEDRINE SULFATE 50 MG/ML IJ SOLN
INTRAMUSCULAR | Status: DC | PRN
  Administered 2020-10-29: 15 mg via INTRAVENOUS

## 2020-10-29 MED ORDER — LIDOCAINE HCL (PF) 2 % IJ SOLN
INTRAMUSCULAR | Status: AC
  Filled 2020-10-29: qty 5

## 2020-10-29 NOTE — Op Note (Signed)
[facility] Gastroenterology Patient Name: Courtney Anderson Procedure Date: 10/29/2020 8:00 AM MRN: [id number] Account #: [id number] Date of Birth: 16-Mar-1956 Admit Type: Outpatient Age: 64 Room: [facility] ENDO ROOM 4 Gender: Female Note Status: Finalized Procedure:             Upper GI endoscopy Indications:           Acute post hemorrhagic anemia, Unexplained iron                         deficiency anemia Providers:             Lin Landsman MD, MD Medicines:             General Anesthesia Complications:         No immediate complications. Estimated blood loss: None. Procedure:             Pre-Anesthesia Assessment:                        - Prior to the procedure, a History and Physical was                         performed, and patient medications and allergies were                         reviewed. The patient is competent. The risks and                         benefits of the procedure and the sedation options and                         risks were discussed with the patient. All questions                         were answered and informed consent was obtained.                         Patient identification and proposed procedure were                         verified by the physician, the nurse, the                         anesthesiologist, the anesthetist and the technician                         in the pre-procedure area in the procedure room in the                         endoscopy suite. Mental Status Examination: alert and                         oriented. Airway Examination: normal oropharyngeal                         airway and neck mobility. Respiratory Examination:                         clear to auscultation. CV Examination: normal.  Prophylactic Antibiotics: The patient does not require                         prophylactic antibiotics. Prior Anticoagulants: The                         patient has taken Xarelto (rivaroxaban),  last dose was                         3 days prior to procedure. ASA Grade Assessment: III -                         A patient with severe systemic disease. After                         reviewing the risks and benefits, the patient was                         deemed in satisfactory condition to undergo the                         procedure. The anesthesia plan was to use general                         anesthesia. Immediately prior to administration of                         medications, the patient was re-assessed for adequacy                         to receive sedatives. The heart rate, respiratory                         rate, oxygen saturations, blood pressure, adequacy of                         pulmonary ventilation, and response to care were                         monitored throughout the procedure. The physical                         status of the patient was re-assessed after the                         procedure.                        After obtaining informed consent, the endoscope was                         passed under direct vision. Throughout the procedure,                         the patient's blood pressure, pulse, and oxygen                         saturations were monitored continuously. The Endoscope  was introduced through the mouth, and advanced to the                         second part of duodenum. The upper GI endoscopy was                         accomplished without difficulty. The patient tolerated                         the procedure well. Findings:      The esophagus was normal.      The stomach was normal. Biopsies were taken with a cold forceps for       Helicobacter pylori testing.      The examined duodenum was normal. Impression:            - Normal esophagus.                        - Normal stomach.                        - Normal examined duodenum.                        - No specimens collected. Recommendation:        -  Await pathology results.                        - Proceed with colonoscopy as scheduled                        See colonoscopy report Procedure Code(s):     --- Professional ---                        [phone], Esophagogastroduodenoscopy, flexible,                         transoral; with biopsy, single or multiple Diagnosis Code(s):     --- Professional ---                        D62, Acute posthemorrhagic anemia                        D50.9, Iron deficiency anemia, unspecified CPT copyright 2019 American Medical Association. All rights reserved. The codes documented in this report are preliminary and upon coder review may  be revised to meet current compliance requirements. Dr. Ulyess Mort Lin Landsman MD, MD 10/29/2020 8:16:27 AM This report has been signed electronically. Number of Addenda: 0 Note Initiated On: 10/29/2020 8:00 AM Estimated Blood Loss:  Estimated blood loss: none.      [facility]

## 2020-10-29 NOTE — Anesthesia Postprocedure Evaluation (Signed)
Anesthesia Post Note  Patient: Courtney Anderson  Procedure(s) Performed: ESOPHAGOGASTRODUODENOSCOPY (EGD) WITH PROPOFOL (N/A ) COLONOSCOPY WITH PROPOFOL (N/A )  Patient location during evaluation: PACU Anesthesia Type: General Level of consciousness: awake and alert Pain management: pain level controlled Vital Signs Assessment: post-procedure vital signs reviewed and stable Respiratory status: spontaneous breathing, nonlabored ventilation, respiratory function stable and patient connected to nasal cannula oxygen Cardiovascular status: blood pressure returned to baseline and stable Postop Assessment: no apparent nausea or vomiting Anesthetic complications: no   No complications documented.   Last Vitals:  Vitals:   10/29/20 0910 10/29/20 1000  BP: (!) 110/52 (!) 134/58  Pulse: 67   Resp: 18 20  Temp: 37.1 C   SpO2: 99% 98%    Last Pain:  Vitals:   10/29/20 0910  TempSrc: Oral  PainSc: 0-No pain                 Martha Clan

## 2020-10-29 NOTE — Consult Note (Signed)
[place] for Electrolyte Monitoring and Replacement   Recent Labs: Potassium (mmol/L)  Date Value  10/29/2020 3.2 (L)   Magnesium (mg/dL)  Date Value  10/29/2020 2.1   Calcium (mg/dL)  Date Value  10/29/2020 9.0   Calcium, Total (PTH) (mg/dL)  Date Value  07/24/2020 10.1   Albumin (g/dL)  Date Value  10/27/2020 2.9 (L)  10/22/2019 4.2   Phosphorus (mg/dL)  Date Value  10/29/2020 2.6   Sodium (mmol/L)  Date Value  10/29/2020 136  10/22/2019 139    Assessment: 64 year old female with history of atrial fibrillation presents to the ER for evaluation of generalized malaise fatigue and concern for anemia. Pharmacy consulted for electrolyte management. Patient s/p EGD no abnormalities noted per GI Dr Marius Ditch  Relevant Medications: --none at this point (Olmesartan on home med list)  Goal of Therapy:  Potassium 4.0 - 5.1 mmol/L Magnesium 2.0 - 2.4 g/dL All Other Electrolytes WNL  Plan:   10 mEq IV KCl x 2  40 mEq oral KCl x 1  Will check Mag and BMP with morning labs  Dallie Piles ,PharmD, BCPS Clinical Pharmacist 10/29/2020 9:52 AM

## 2020-10-29 NOTE — Op Note (Signed)
[facility] Gastroenterology Patient Name: Courtney Anderson Procedure Date: 10/29/2020 8:00 AM MRN: [id number] Account #: [id number] Date of Birth: February 18, 1956 Admit Type: Inpatient Age: 64 Room: [facility] ENDO ROOM 4 Gender: Female Note Status: Finalized Procedure:             Colonoscopy Indications:           This is the patient's first colonoscopy, Acute post                         hemorrhagic anemia Providers:             Toney Reil MD, MD Medicines:             General Anesthesia Complications:         No immediate complications. Estimated blood loss: None. Procedure:             Pre-Anesthesia Assessment:                        - Prior to the procedure, a History and Physical was                         performed, and patient medications and allergies were                         reviewed. The patient is competent. The risks and                         benefits of the procedure and the sedation options and                         risks were discussed with the patient. All questions                         were answered and informed consent was obtained.                         Patient identification and proposed procedure were                         verified by the physician, the nurse, the                         anesthesiologist, the anesthetist and the technician                         in the pre-procedure area in the procedure room in the                         endoscopy suite. Mental Status Examination: alert and                         oriented. Airway Examination: normal oropharyngeal                         airway and neck mobility. Respiratory Examination:                         clear to auscultation. CV Examination: normal.  Prophylactic Antibiotics: The patient does not require                         prophylactic antibiotics. Prior Anticoagulants: The                         patient has taken Xarelto (rivaroxaban), last  dose was                         3 days prior to procedure. ASA Grade Assessment: III -                         A patient with severe systemic disease. After                         reviewing the risks and benefits, the patient was                         deemed in satisfactory condition to undergo the                         procedure. The anesthesia plan was to use general                         anesthesia. Immediately prior to administration of                         medications, the patient was re-assessed for adequacy                         to receive sedatives. The heart rate, respiratory                         rate, oxygen saturations, blood pressure, adequacy of                         pulmonary ventilation, and response to care were                         monitored throughout the procedure. The physical                         status of the patient was re-assessed after the                         procedure.                        After obtaining informed consent, the colonoscope was                         passed under direct vision. Throughout the procedure,                         the patient's blood pressure, pulse, and oxygen                         saturations were monitored continuously. The  Colonoscope was introduced through the anus and                         advanced to the the terminal ileum, with                         identification of the appendiceal orifice and IC                         valve. The colonoscopy was performed without                         difficulty. The patient tolerated the procedure well.                         The quality of the bowel preparation was adequate. Findings:      The perianal and digital rectal examinations were normal. Pertinent       negatives include normal sphincter tone and no palpable rectal lesions.      The terminal ileum appeared normal.      Three sessile polyps were found in the cecum. The  polyps were diminutive       in size. These polyps were removed with a cold biopsy forceps. Resection       and retrieval were complete.      The exam was otherwise without abnormality.      The retroflexed view of the distal rectum and anal verge was normal and       showed no anal or rectal abnormalities. Impression:            - The examined portion of the ileum was normal.                        - Three diminutive polyps in the cecum, removed with a                         cold biopsy forceps. Resected and retrieved.                        - The examination was otherwise normal.                        - The distal rectum and anal verge are normal on                         retroflexion view. Recommendation:        - Return patient to hospital ward for possible                         discharge same day.                        - Resume regular diet today.                        - Continue present medications.                        - Await pathology results.                        -  Return to GI clinic in 2 months. Procedure Code(s):     --- Professional ---                        [phone], Colonoscopy, flexible; with biopsy, single or                         multiple Diagnosis Code(s):     --- Professional ---                        K63.5, Polyp of colon                        D62, Acute posthemorrhagic anemia CPT copyright 2019 American Medical Association. All rights reserved. The codes documented in this report are preliminary and upon coder review may  be revised to meet current compliance requirements. Dr. Ulyess Mort Lin Landsman MD, MD 10/29/2020 8:32:58 AM This report has been signed electronically. Number of Addenda: 0 Note Initiated On: 10/29/2020 8:00 AM Scope Withdrawal Time: 0 hours 7 minutes 15 seconds  Total Procedure Duration: 0 hours 10 minutes 50 seconds  Estimated Blood Loss:  Estimated blood loss: none.      [facility]

## 2020-10-29 NOTE — Progress Notes (Addendum)
CRITICAL CARE PROGRESS NOTE    Name: Courtney Anderson MRN: [id number] DOB: 1956/05/25     LOS: 2   SUBJECTIVE FINDINGS & SIGNIFICANT EVENTS    Patient description:   64yo F w PMH as listed below, notable hx of MICU admission in 07/2020 with septic shock COVID19 needing vasopressor support, noted at that time to have numerous open skin wounds of torso, pelvis , intertriginous areas,complicated by devt of AFrVR was placed on xarelto on outpatient. Grandson who accompanied patient in ED during my evaluation shares patient is very pale, and patient states she noted over past 1-2wk BP has been decreasing so she did not need to take her antihypertensives.  She denied seeing blood in stool.  She was found to be in hemorrhagic shock with Hb 2.7. FOBT + stool.  MICU admission requested for management.   10/28/20- patient clinically improved. Plan for GI scoping. Cardiology consult placed per GI request for clearance to have EGD.   10/29/20- patient s/p EGD no abnormalities noted per GI Dr Marius Ditch. Will downgrade to [facility].    Lines/tubes : External Urinary Catheter (Active)    Microbiology/Sepsis markers: Results for orders placed or performed during the hospital encounter of 10/27/20  Resp Panel by RT-PCR (Flu A&B, Covid) Nasopharyngeal Swab     Status: None   Collection Time: 10/27/20 12:45 PM   Specimen: Nasopharyngeal Swab; Nasopharyngeal(NP) swabs in vial transport medium  Result Value Ref Range Status   SARS Coronavirus 2 by RT PCR NEGATIVE NEGATIVE Final    Comment: (NOTE) SARS-CoV-2 target nucleic acids are NOT DETECTED.  The SARS-CoV-2 RNA is generally detectable in upper respiratory specimens during the acute phase of infection. The lowest concentration of SARS-CoV-2 viral copies this assay can detect is 138  copies/mL. A negative result does not preclude SARS-Cov-2 infection and should not be used as the sole basis for treatment or other patient management decisions. A negative result may occur with  improper specimen collection/handling, submission of specimen other than nasopharyngeal swab, presence of viral mutation(s) within the areas targeted by this assay, and inadequate number of viral copies(<138 copies/mL). A negative result must be combined with clinical observations, patient history, and epidemiological information. The expected result is Negative.  Fact Sheet for Patients:  EntrepreneurPulse.com.au  Fact Sheet for Healthcare Providers:  IncredibleEmployment.be  This test is no t yet approved or cleared by the [place] FDA and  has been authorized for detection and/or diagnosis of SARS-CoV-2 by FDA under an Emergency Use Authorization (EUA). This EUA will remain  in effect (meaning this test can be used) for the duration of the COVID-19 declaration under Section 564(b)(1) of the Act, 21 U.S.C.section 360bbb-3(b)(1), unless the authorization is terminated  or revoked sooner.       Influenza A by PCR NEGATIVE NEGATIVE Final   Influenza B by PCR NEGATIVE NEGATIVE Final    Comment: (NOTE) The Xpert Xpress SARS-CoV-2/FLU/RSV plus assay is intended as an aid in the diagnosis of influenza from Nasopharyngeal swab specimens and should not be used as a sole basis for treatment. Nasal washings and aspirates are unacceptable for Xpert Xpress SARS-CoV-2/FLU/RSV testing.  Fact Sheet for Patients: EntrepreneurPulse.com.au  Fact Sheet for Healthcare Providers: IncredibleEmployment.be  This test is not yet approved or cleared by the [place] FDA and has been authorized for detection and/or diagnosis of SARS-CoV-2 by FDA under an Emergency Use Authorization (EUA). This EUA will remain in effect (meaning  this test can be used) for the duration  of the COVID-19 declaration under Section 564(b)(1) of the Act, 21 U.S.C. section 360bbb-3(b)(1), unless the authorization is terminated or revoked.  Performed at [facility], [address] [place], [place] [address]   Culture, blood (routine x 2)     Status: None (Preliminary result)   Collection Time: 10/27/20  2:58 PM   Specimen: BLOOD  Result Value Ref Range Status   Specimen Description BLOOD RIGHT ANTECUBITAL  Final   Special Requests   Final    BOTTLES DRAWN AEROBIC AND ANAEROBIC Blood Culture adequate volume   Culture   Final    NO GROWTH 2 DAYS Performed at [facility], [address], [place], [place] [address]    Report Status PENDING  Incomplete  Culture, blood (routine x 2)     Status: None (Preliminary result)   Collection Time: 10/27/20  3:03 PM   Specimen: BLOOD  Result Value Ref Range Status   Specimen Description BLOOD LEFT ANTECUBITAL  Final   Special Requests   Final    BOTTLES DRAWN AEROBIC AND ANAEROBIC Blood Culture adequate volume   Culture   Final    NO GROWTH 2 DAYS Performed at [facility], [address], [place], [place] [address]    Report Status PENDING  Incomplete  MRSA PCR Screening     Status: None   Collection Time: 10/27/20  3:53 PM   Specimen: Nasopharyngeal  Result Value Ref Range Status   MRSA by PCR NEGATIVE NEGATIVE Final    Comment:        The GeneXpert MRSA Assay (FDA approved for NASAL specimens only), is one component of a comprehensive MRSA colonization surveillance program. It is not intended to diagnose MRSA infection nor to guide or monitor treatment for MRSA infections. Performed at [facility], [address] [place], [place] [address]     Anti-infectives:  Anti-infectives (From admission, onward)   None       Consults: Treatment Team:  Lin Landsman, MD  GI consult  PAST MEDICAL HISTORY   Past Medical History:   Diagnosis Date  . Allergy   . COVID-19   . Elevated transaminase level   . Hyperlipidemia   . Hypertension   . Obesity      SURGICAL HISTORY   Past Surgical History:  Procedure Laterality Date  . BREAST SURGERY    . ECTOPIC PREGNANCY SURGERY       FAMILY HISTORY   Family History  Problem Relation Age of Onset  . Hypertension Father   . Stroke Father   . Hypertension Mother   . Arthritis Mother   . Aneurysm Sister   . Hypertension Sister   . Hypertension Brother   . Hypertension Sister   . Hypertension Brother      SOCIAL HISTORY   Social History   Tobacco Use  . Smoking status: Former Research scientist (life sciences)  . Smokeless tobacco: Never Used  Substance Use Topics  . Alcohol use: Yes    Alcohol/week: 0.0 standard drinks    Comment: on occasion  . Drug use: No     MEDICATIONS   Current Medication:  Current Facility-Administered Medications:  .  [MAR Hold] 0.9 %  sodium chloride infusion (Manually program via Guardrails IV Fluids), , Intravenous, Once, Starsky Nanna, MD .  0.9 %  sodium chloride infusion, , Intravenous, Continuous, Vanga, Tally Due, MD, Stopped at 10/29/20 [phone] .  [MAR Hold] acetaminophen (TYLENOL) tablet 650 mg, 650 mg, Oral, Q6H PRN, Awilda Bill,  NP, 650 mg at 10/28/20 0504 .  [MAR Hold] Chlorhexidine Gluconate Cloth 2 % PADS 6 each, 6 each, Topical, Q0600, Ottie Glazier, MD, 6 each at 10/28/20 1135 .  [MAR Hold] docusate sodium (COLACE) capsule 100 mg, 100 mg, Oral, BID PRN, Ottie Glazier, MD .  Doug Sou Hold] pantoprazole (PROTONIX) injection 40 mg, 40 mg, Intravenous, QHS, Ji Feldner, MD, 40 mg at 10/28/20 2140 .  [MAR Hold] polyethylene glycol (MIRALAX / GLYCOLAX) packet 17 g, 17 g, Oral, Daily PRN, Ottie Glazier, MD    ALLERGIES   Codeine    REVIEW OF SYSTEMS     10 point ROS negative except weakness fatigue.  PHYSICAL EXAMINATION   Vital Signs: Temp:  [97.2 F (36.2 C)-98 F (36.7 C)] 97.2 F (36.2 C) (12/26  0838) Pulse Rate:  [58-82] 82 (12/26 0838) Resp:  [11-20] 11 (12/26 0838) BP: (98-131)/(49-95) 101/49 (12/26 0838) SpO2:  [93 %-100 %] 97 % (12/26 0838) Weight:  [87.8 kg] 87.8 kg (12/26 0433)  GENERAL:age appropriate HEAD: Normocephalic, atraumatic.  EYES: Pupils equal, round, reactive to light.  No scleral icterus.  MOUTH: Moist mucosal membrane. NECK: Supple. No thyromegaly. No nodules. No JVD.  PULMONARY: CTAB CARDIOVASCULAR: S1 and S2. Regular rate and rhythm. No murmurs, rubs, or gallops.  GASTROINTESTINAL: Soft, nontender, non-distended. No masses. Positive bowel sounds. No hepatosplenomegaly.  MUSCULOSKELETAL: No swelling, clubbing, or edema.  NEUROLOGIC: Mild distress due to acute illness SKIN:intact,warm,dry   PERTINENT DATA     Infusions: . sodium chloride Stopped (10/29/20 0834)   Scheduled Medications: . [MAR Hold] sodium chloride   Intravenous Once  . [MAR Hold] Chlorhexidine Gluconate Cloth  6 each Topical Q0600  . [MAR Hold] pantoprazole (PROTONIX) IV  40 mg Intravenous QHS   PRN Medications: [MAR Hold] acetaminophen, [MAR Hold] docusate sodium, [MAR Hold] polyethylene glycol Hemodynamic parameters:   Intake/Output: 12/25 0701 - 12/26 0700 In: -  Out: [id number] [Urine:3600; Stool:270]  Ventilator  Settings:   LAB RESULTS:  Basic Metabolic Panel: Recent Labs  Lab 10/27/20 1244 10/28/20 0138 10/29/20 0213  NA 136 138 136  K 4.5 3.9 3.2*  CL 105 109 108  CO2 20* 22 22  GLUCOSE 155* 89 88  BUN 15 11 7*  CREATININE 1.09* 0.82 0.94  CALCIUM 9.7 9.0 9.0  MG  --  2.0 2.1  PHOS  --  2.9 2.6   Liver Function Tests: Recent Labs  Lab 10/27/20 1244  AST 19  ALT 8  ALKPHOS 42  BILITOT 0.6  PROT 6.0*  ALBUMIN 2.9*   No results for input(s): LIPASE, AMYLASE in the last 168 hours. No results for input(s): AMMONIA in the last 168 hours. CBC: Recent Labs  Lab 10/27/20 1244 10/27/20 1457 10/27/20 1945 10/28/20 0138 10/28/20 0523 10/29/20 0213   WBC 16.2* 13.2* 12.1*  --  9.3 8.8  NEUTROABS 10.0* 11.3* 9.7*  --   --   --   HGB 2.7* 2.3* 5.1* 7.4* 8.2* 8.1*  HCT 10.1* 8.6* 16.7* 22.1* 23.8* 25.0*  MCV 78.3* 80.4 85.6  --  83.5 85.3  PLT 429* 344 291  --  274 290   Cardiac Enzymes: No results for input(s): CKTOTAL, CKMB, CKMBINDEX, TROPONINI in the last 168 hours. BNP: Invalid input(s): POCBNP CBG: No results for input(s): GLUCAP in the last 168 hours.     IMAGING RESULTS:  Imaging: DG Chest Portable 1 View  Result Date: 10/27/2020 CLINICAL DATA:  Weakness EXAM: PORTABLE CHEST 1 VIEW COMPARISON:  08/18/2020 FINDINGS: Interval removal  of left IJ central line. Stable cardiomegaly. Persistent diffuse interstitial prominence with streaky bibasilar airspace opacities, right greater than left. Overall lung aeration has improved compared to the previous study. No pleural effusion or pneumothorax. IMPRESSION: Improved lung aeration with persistent diffuse interstitial prominence and streaky bibasilar airspace opacities, right greater than left. Electronically Signed   By: Davina Poke D.O.   On: 10/27/2020 14:28   @PROBHOSP @ No results found.   ASSESSMENT AND PLAN    -Multidisciplinary rounds held today  Hemorrhagic shock-resolved Present on admission-due to lower GI bleed secondary to anticoagulation with Xarelto - Hb 2.7 -BP 73/34 -Transfuse blood product to goal Hb>7 with addition of FFP for dilutional coagulopathy.  May hold Kaycentra for now until recheck h/h  -GI consult - no hx of portal hypertensive dz or varices.  S/p PPI gtt>>protonix IV     Acute blood loss anemia   - due to GI bleed from anticoagulation    - GI consult    - replete blood products    - monitor h/h     - ensure adequate  ICU monitoring           GI on case - Dr Marius Ditch - egd/cscope-12/26-EGD normal   S/p COVID19 with septic shock   - blood cultures     - currently respiratory status is stable    Multiple open skin wounds     Present on admission    - wound care    ID -continue IV abx as prescibed -follow up cultures  GI/Nutrition GI PROPHYLAXIS as indicated DIET-->TF's as tolerated Constipation protocol as indicated  ENDO - ICU hypoglycemic\Hyperglycemia protocol -check FSBS per protocol   ELECTROLYTES -follow labs as needed -replace as needed -pharmacy consultation   DVT/GI PRX ordered -SCDs  TRANSFUSIONS AS NEEDED MONITOR FSBS ASSESS the need for LABS as needed   Critical care provider statement:    Critical care time (minutes):  34   Critical care time was exclusive of:  Separately billable procedures and treating other patients   Critical care was necessary to treat or prevent imminent or life-threatening deterioration of the following conditions:  hemorrhagic shock, acute blood loss anemia, multiple comorbid conditions.    Critical care was time spent personally by me on the following activities:  Development of treatment plan with patient or surrogate, discussions with consultants, evaluation of patient's response to treatment, examination of patient, obtaining history from patient or surrogate, ordering and performing treatments and interventions, ordering and review of laboratory studies and re-evaluation of patient's condition.  I assumed direction of critical care for this patient from another provider in my specialty: no    This document was prepared using Dragon voice recognition software and may include unintentional dictation errors.    Ottie Glazier, M.D.  Division of [facility]

## 2020-10-29 NOTE — Progress Notes (Signed)
OT Cancellation Note  Patient Details Name: Courtney Anderson MRN: [id number] DOB: [birth year]   Cancelled Treatment:    Reason Eval/Treat Not Completed: Patient at procedure or test/ unavailable. Order received and chart reviewed. Pt currently off unit for colonoscopy and endoscopy procedures. Will follow up at later date/time as able to initiate services.   Dessie Coma, M.S. OTR/L  10/29/20, 8:19 AM  ascom [phone]

## 2020-10-29 NOTE — Progress Notes (Signed)
59 Down to Endo for EGD and Colonoscopy.

## 2020-10-29 NOTE — Anesthesia Preprocedure Evaluation (Addendum)
Anesthesia Evaluation  Patient identified by MRN, date of birth, ID band Patient awake    Reviewed: Allergy & Precautions, H&P , NPO status , Patient's Chart, lab work & pertinent test results, reviewed documented beta blocker date and time   History of Anesthesia Complications Negative for: history of anesthetic complications  Airway Mallampati: IV  TM Distance: >3 FB Neck ROM: full    Dental  (+) Dental Advidsory Given, Teeth Intact, Missing   Pulmonary neg pulmonary ROS, former smoker,    Pulmonary exam normal breath sounds clear to auscultation       Cardiovascular Exercise Tolerance: Good hypertension, (-) angina(-) Past MI and (-) Cardiac Stents Normal cardiovascular exam+ dysrhythmias Atrial Fibrillation (-) Valvular Problems/Murmurs Rhythm:regular Rate:Normal     Neuro/Psych negative neurological ROS  negative psych ROS   GI/Hepatic negative GI ROS, Neg liver ROS,   Endo/Other  negative endocrine ROS  Renal/GU ARFRenal disease  negative genitourinary   Musculoskeletal  (+) Arthritis , Osteoarthritis,    Abdominal   Peds  Hematology  (+) Blood dyscrasia, anemia ,   Anesthesia Other Findings Past Medical History: No date: Allergy No date: COVID-19 No date: Elevated transaminase level No date: Hyperlipidemia No date: Hypertension No date: Obesity   Reproductive/Obstetrics negative OB ROS                            Anesthesia Physical Anesthesia Plan  ASA: III  Anesthesia Plan: General   Post-op Pain Management:    Induction: Intravenous  PONV Risk Score and Plan: 3 and Propofol infusion and TIVA  Airway Management Planned: Natural Airway and Nasal Cannula  Additional Equipment:   Intra-op Plan:   Post-operative Plan:   Informed Consent: I have reviewed the patients History and Physical, chart, labs and discussed the procedure including the risks, benefits and  alternatives for the proposed anesthesia with the patient or authorized representative who has indicated his/her understanding and acceptance.     Dental Advisory Given  Plan Discussed with: Anesthesiologist, CRNA and Surgeon  Anesthesia Plan Comments:        Anesthesia Quick Evaluation

## 2020-10-29 NOTE — Progress Notes (Signed)
Shift summary  Patient alert and oriented throughout shift Patient complains of no pain throughout shift  Vitals signs stable throughout shift 4 brown/watery liquid bowel movements  Bowel contents yellow/clear after morning enema.

## 2020-10-29 NOTE — Consult Note (Signed)
[facility] Consultation Note  Patient ID: Courtney Anderson, MRN: [id number], DOB/AGE: 1956-08-17 64 y.o. Admit date: 10/27/2020   Date of Consult: 10/29/2020 Primary Physician: System, Provider Not In Primary Cardiologist: None  Chief Complaint:  Chief Complaint  Patient presents with  . Hypotension  . Anemia   Reason for Consult: Atrial fibrillation  HPI: 64 y.o. female with no known major cardiovascular issues in the past until apparently seen in the hospital for Covid infection which lasted for approximately a month.  At that time the patient apparently had atrial fibrillation with rapid ventricular rate for which she had spontaneously converted to normal sinus rhythm and was taking apparent amiodarone as well as anticoagulation.  The patient had done well and was starting into rehabilitation throughout the last months.  There has been no evidence of bleeding complications or bruising or any other concerns but the patient was being much more weak and fatigued.  There is no evidence of anginal symptoms or congestive heart failure.  The patient then was pale and needed to be further evaluated and a hemoglobin apparently was 2.7 suggesting that the patient had some bleeding potential and bleeding complications from anticoagulation.  She has been transfused for which she has tolerated well with no evidence of heart failure type symptoms and has remained in the normal sinus rhythm.  Anticoagulation has been discontinued.  EKG has shown normal sinus rhythm with right ventricular hypertrophy.  Chest x-ray showed some interstitial findings.  The patient therefore has had no cardiovascular symptoms or recurrence of atrial fibrillation during this hospitalization for her significant anemia.  She is due for further evaluation including colonoscopy and endoscopy for which she is at low risk at this time.  Past Medical History:  Diagnosis Date  . Allergy   . COVID-19   . Elevated  transaminase level   . Hyperlipidemia   . Hypertension   . Obesity       Surgical History:  Past Surgical History:  Procedure Laterality Date  . BREAST SURGERY    . ECTOPIC PREGNANCY SURGERY       Home Meds: Prior to Admission medications   Medication Sig Start Date End Date Taking? Authorizing Provider  amiodarone (PACERONE) 200 MG tablet Take 1 tablet (200 mg total) by mouth daily. 10/02/20  Yes Cannady, Henrine Screws T, NP  aspirin 81 MG tablet Take 81 mg by mouth daily.   Yes [provider]  Cholecalciferol (VITAMIN D) 50 MCG (2000 UT) tablet Take 2,000 Units by mouth daily.   Yes [provider]  Multiple Vitamin (MULTIVITAMIN) tablet Take 1 tablet by mouth daily.   Yes [provider]  olmesartan (BENICAR) 20 MG tablet Take 1 tablet (20 mg total) by mouth daily. 09/07/20  Yes Cannady, Henrine Screws T, NP  rivaroxaban (XARELTO) 20 MG TABS tablet Take 1 tablet (20 mg total) by mouth daily with supper. 10/02/20  Yes Marnee Guarneri T, NP    Inpatient Medications:  . sodium chloride   Intravenous Once  . Chlorhexidine Gluconate Cloth  6 each Topical Q0600  . pantoprazole (PROTONIX) IV  40 mg Intravenous QHS   . sodium chloride 10 mL/hr at 10/29/20 0536  . potassium chloride 10 mEq (10/29/20 0538)    Allergies:  Allergies  Allergen Reactions  . Codeine     Social History   Socioeconomic History  . Marital status: Single    Spouse name: Not on file  . Number of children: Not on file  . Years  of education: Not on file  . Highest education level: Not on file  Occupational History  . Not on file  Tobacco Use  . Smoking status: Former Research scientist (life sciences)  . Smokeless tobacco: Never Used  Substance and Sexual Activity  . Alcohol use: Yes    Alcohol/week: 0.0 standard drinks    Comment: on occasion  . Drug use: No  . Sexual activity: Never  Other Topics Concern  . Not on file  Social History Narrative  . Not on file   Social Determinants of Health    Financial Resource Strain: Not on file  Food Insecurity: Not on file  Transportation Needs: Not on file  Physical Activity: Not on file  Stress: Not on file  Social Connections: Not on file  Intimate Partner Violence: Not on file     Family History  Problem Relation Age of Onset  . Hypertension Father   . Stroke Father   . Hypertension Mother   . Arthritis Mother   . Aneurysm Sister   . Hypertension Sister   . Hypertension Brother   . Hypertension Sister   . Hypertension Brother      Review of Systems Positive for shortness of breath Negative for: General:  chills, fever, night sweats or weight changes.  Cardiovascular: PND orthopnea syncope dizziness  Dermatological skin lesions rashes Respiratory: Cough congestion Urologic: Frequent urination urination at night and hematuria Abdominal: negative for nausea, vomiting, diarrhea, bright red blood per rectum, melena, or hematemesis Neurologic: negative for visual changes, and/or hearing changes  All other systems reviewed and are otherwise negative except as noted above.  Labs: No results for input(s): CKTOTAL, CKMB, TROPONINI in the last 72 hours. Lab Results  Component Value Date   WBC 8.8 10/29/2020   HGB 8.1 (L) 10/29/2020   HCT 25.0 (L) 10/29/2020   MCV 85.3 10/29/2020   PLT 290 10/29/2020    Recent Labs  Lab 10/27/20 1244 10/28/20 0138 10/29/20 0213  NA 136   < > 136  K 4.5   < > 3.2*  CL 105   < > 108  CO2 20*   < > 22  BUN 15   < > 7*  CREATININE 1.09*   < > 0.94  CALCIUM 9.7   < > 9.0  PROT 6.0*  --   --   BILITOT 0.6  --   --   ALKPHOS 42  --   --   ALT 8  --   --   AST 19  --   --   GLUCOSE 155*   < > 88   < > = values in this interval not displayed.   Lab Results  Component Value Date   CHOL 175 10/22/2019   HDL 50 10/22/2019   LDLCALC 110 (H) 10/22/2019   TRIG 86 08/14/2020   No results found for: DDIMER  Radiology/Studies:  DG Chest Portable 1 View  Result Date:  10/27/2020 CLINICAL DATA:  Weakness EXAM: PORTABLE CHEST 1 VIEW COMPARISON:  08/18/2020 FINDINGS: Interval removal of left IJ central line. Stable cardiomegaly. Persistent diffuse interstitial prominence with streaky bibasilar airspace opacities, right greater than left. Overall lung aeration has improved compared to the previous study. No pleural effusion or pneumothorax. IMPRESSION: Improved lung aeration with persistent diffuse interstitial prominence and streaky bibasilar airspace opacities, right greater than left. Electronically Signed   By: Davina Poke D.O.   On: 10/27/2020 14:28    EKG: Normal sinus rhythm with right ventricular hypertrophy  Weights: Danley Danker  Weights   10/27/20 1552 10/28/20 0354 10/29/20 0433  Weight: 99.8 kg 99.9 kg 87.8 kg     Physical Exam: Blood pressure (!) 116/57, pulse 64, temperature 97.9 F (36.6 C), temperature source Oral, resp. rate 16, height 5\' 5"  (1.651 m), weight 87.8 kg, SpO2 97 %. Body mass index is 32.21 kg/m. General: Well developed, well nourished, in no acute distress. Head eyes ears nose throat: Normocephalic, atraumatic, sclera non-icteric, no xanthomas, nares are without discharge. No apparent thyromegaly and/or mass  Lungs: Normal respiratory effort.  no wheezes, no rales, no rhonchi.  Heart: RRR with normal S1 S2. no murmur gallop, no rub, PMI is normal size and placement, carotid upstroke normal without bruit, jugular venous pressure is normal Abdomen: Soft, non-tender, non-distended with normoactive bowel sounds. No hepatomegaly. No rebound/guarding. No obvious abdominal masses. Abdominal aorta is normal size without bruit Extremities: Trace edema. no cyanosis, no clubbing, no ulcers  Peripheral : 2+ bilateral upper extremity pulses, 2+ bilateral femoral pulses, 2+ bilateral dorsal pedal pulse Neuro: Alert and oriented. No facial asymmetry. No focal deficit. Moves all extremities spontaneously. Musculoskeletal: Normal muscle tone  without kyphosis Psych:  Responds to questions appropriately with a normal affect.    Assessment: 64 year old female with recent Covid pneumonia and infection without evidence of myocardial infarction or congestive heart failure but had atrial fibrillation with rapid ventricular rate treated with amiodarone and anticoagulation now with acute anemia and symptoms but no current evidence of acute coronary syndrome congestive heart failure or angina at lowest risk possible for cardiovascular complication with colonoscopy and endoscopy  Plan: 1.  Proceed to colonoscopy and endoscopy without restriction at this time due to no current evidence of atrial fibrillation chest pain congestive heart failure or cardiovascular symptoms 2.  No further cardiac diagnostics necessary at this time 3.  No reinstatement of anticoagulation at this time due to concerns of bleeding complications and will reevaluate with this hospitalization to assess for need in treatment for atrial fibrillation if not reoccurring and possibly secondary to primary cause of Covid infection.  Signed, Corey Skains M.D. [facility] 10/29/2020, 6:17 AM

## 2020-10-29 NOTE — Transfer of Care (Signed)
Immediate Anesthesia Transfer of Care Note  Patient: Courtney Anderson  Procedure(s) Performed: Procedure(s): ESOPHAGOGASTRODUODENOSCOPY (EGD) WITH PROPOFOL (N/A) COLONOSCOPY WITH PROPOFOL (N/A)  Patient Location: PACU   Anesthesia Type:General  Level of Consciousness: sedated  Airway & Oxygen Therapy: Patient Spontanous Breathing and Patient connected to nasal cannula oxygen  Post-op Assessment: Report given to RN and Post -op Vital signs reviewed and stable  Post vital signs: Reviewed and stable  Last Vitals:  Vitals:   10/29/20 0732 10/29/20 0838  BP: (!) 111/58 (!) 101/49  Pulse: (!) 59 82  Resp: 20 11  Temp: 36.4 C   SpO2: 931% 12%    Complications: No apparent anesthesia complications

## 2020-10-29 NOTE — Progress Notes (Signed)
0910 Back in room. States Dr. Marius Ditch stated she could go home. Awaiting orders.

## 2020-10-29 NOTE — Anesthesia Procedure Notes (Signed)
Date/Time: 10/29/2020 8:05 AM Performed by: Doreen Salvage, CRNA Pre-anesthesia Checklist: Patient identified, Emergency Drugs available, Suction available and Patient being monitored Patient Re-evaluated:Patient Re-evaluated prior to induction Oxygen Delivery Method: Nasal cannula Induction Type: IV induction Dental Injury: Teeth and Oropharynx as per pre-operative assessment  Comments: Nasal cannula with etCO2 monitoring

## 2020-10-30 ENCOUNTER — Encounter: Payer: Self-pay | Admitting: Gastroenterology

## 2020-10-30 DIAGNOSIS — R578 Other shock: Secondary | ICD-10-CM

## 2020-10-30 LAB — BPAM RBC
Blood Product Expiration Date: 202201222359
Blood Product Expiration Date: 202201222359
Blood Product Expiration Date: 202201252359
Blood Product Expiration Date: 202201262359
Blood Product Expiration Date: 202201262359
Blood Product Expiration Date: 202201262359
ISSUE DATE / TIME: 202112241445
ISSUE DATE / TIME: 202112241606
ISSUE DATE / TIME: 202112242108
ISSUE DATE / TIME: 202112242239
Unit Type and Rh: 5100
Unit Type and Rh: 5100
Unit Type and Rh: 5100
Unit Type and Rh: 5100
Unit Type and Rh: 5100
Unit Type and Rh: 5100

## 2020-10-30 LAB — CBC
HCT: 25.5 % — ABNORMAL LOW (ref 36.0–46.0)
Hemoglobin: 8.3 g/dL — ABNORMAL LOW (ref 12.0–15.0)
MCH: 27.6 pg (ref 26.0–34.0)
MCHC: 32.5 g/dL (ref 30.0–36.0)
MCV: 84.7 fL (ref 80.0–100.0)
Platelets: 297 10*3/uL (ref 150–400)
RBC: 3.01 MIL/uL — ABNORMAL LOW (ref 3.87–5.11)
RDW: 17.1 % — ABNORMAL HIGH (ref 11.5–15.5)
WBC: 9.2 10*3/uL (ref 4.0–10.5)
nRBC: 0.5 % — ABNORMAL HIGH (ref 0.0–0.2)

## 2020-10-30 LAB — TYPE AND SCREEN
ABO/RH(D): O POS
Antibody Screen: NEGATIVE
Unit division: 0
Unit division: 0
Unit division: 0
Unit division: 0
Unit division: 0
Unit division: 0

## 2020-10-30 LAB — BASIC METABOLIC PANEL
Anion gap: 6 (ref 5–15)
BUN: 8 mg/dL (ref 8–23)
CO2: 24 mmol/L (ref 22–32)
Calcium: 8.9 mg/dL (ref 8.9–10.3)
Chloride: 104 mmol/L (ref 98–111)
Creatinine, Ser: 1.08 mg/dL — ABNORMAL HIGH (ref 0.44–1.00)
GFR, Estimated: 57 mL/min — ABNORMAL LOW (ref >60)
Glucose, Bld: 85 mg/dL (ref 70–99)
Potassium: 3.4 mmol/L — ABNORMAL LOW (ref 3.5–5.1)
Sodium: 134 mmol/L — ABNORMAL LOW (ref 135–145)

## 2020-10-30 LAB — PREPARE RBC (CROSSMATCH)

## 2020-10-30 LAB — GLUCOSE, CAPILLARY: Glucose-Capillary: 102 mg/dL — ABNORMAL HIGH (ref 70–99)

## 2020-10-30 LAB — PHOSPHORUS: Phosphorus: 2.8 mg/dL (ref 2.5–4.6)

## 2020-10-30 LAB — MAGNESIUM: Magnesium: 1.9 mg/dL (ref 1.7–2.4)

## 2020-10-30 MED ORDER — MAGNESIUM SULFATE 2 GM/50ML IV SOLN: 2.0000 g | Freq: Once | INTRAVENOUS | Status: DC

## 2020-10-30 MED ORDER — POTASSIUM CHLORIDE CRYS ER 20 MEQ PO TBCR: 40.0000 meq | EXTENDED_RELEASE_TABLET | Freq: Once | ORAL | Status: DC

## 2020-10-30 MED ORDER — PANTOPRAZOLE SODIUM 40 MG PO TBEC: 40.0000 mg | DELAYED_RELEASE_TABLET | Freq: Every day | ORAL | 0 refills | Status: DC

## 2020-10-30 NOTE — Progress Notes (Signed)
Discharged home with son. Discharge instructions given and questions answered.

## 2020-10-30 NOTE — Evaluation (Signed)
Physical Therapy Evaluation Patient Details Name: Courtney Anderson MRN: [id number] DOB: December 22, 1955 Today's Date: 10/30/2020   History of Present Illness  Courtney Anderson is a 64 y.o. F PMH: HTN, hypoTSH, who comes to [facility] on 9/20 after 1 weeks of cough, congestion, weakness, intermittent fever chills. Pt admitted with code sepsis, (+) COVID19. Pt having issues with AF/RVR upon arrival.     Clinical Impression  Pt received in Semi-Fowler's position and agreeable to therapy.   Pt very defensive when asked about PMH and home layout questions stating "I don't understand why you keep asking me questions about my house, I'm going home today."  Pt received education on role of therapist in assisting medical team to determining most appropriate place for pt to be d/c to.  Pt also required max encouragement in order to perform any type of mobility.  Pt was able to transfer to EOB with supervision and required only CGA from therapist for management of the telemetry box and for safety.  Pt able to navigate around the hospital bed and return to the bed where she was able to transfer back to supine position.  Pt will benefit from skilled PT intervention to increase independence and safety with basic mobility in preparation for discharge to the venue listed below.       Follow Up Recommendations Home health PT;Supervision for mobility/OOB    Equipment Recommendations       Recommendations for Other Services       Precautions / Restrictions Precautions Precautions: Fall Restrictions Weight Bearing Restrictions: No      Mobility  Bed Mobility Overal bed mobility: Modified Independent             General bed mobility comments: HoB elevated    Transfers Overall transfer level: Needs assistance Equipment used: Rolling walker (2 wheeled) Transfers: Sit to/from Stand Sit to Stand: Min guard;From elevated surface            Ambulation/Gait                Stairs             Wheelchair Mobility    Modified Rankin (Stroke Patients Only)       Balance Overall balance assessment: Needs assistance Sitting-balance support: No upper extremity supported;Feet supported Sitting balance-Leahy Scale: Fair     Standing balance support: No upper extremity supported;Bilateral upper extremity supported Standing balance-Leahy Scale: Fair                               Pertinent Vitals/Pain Pain Assessment: No/denies pain    Home Living Family/patient expects to be discharged to:: Private residence Living Arrangements: Children Available Help at Discharge: Family Type of Home: Mobile home Home Access: Stairs to enter   Entrance Stairs-Number of Steps: 6 Home Layout: One level Home Equipment: [place] - 2 wheels;Bedside commode;Hospital bed Additional Comments: Pt declines answering home setup, taken from chart review    Prior Function Level of Independence: Independent         Comments: Pt refused to answer history questions, so pulled from previous admittance of 10/21.     Hand Dominance   Dominant Hand: Right    Extremity/Trunk Assessment   Upper Extremity Assessment Upper Extremity Assessment: Generalized weakness    Lower Extremity Assessment Lower Extremity Assessment: Generalized weakness       Communication   Communication: Other (comment) (Pt defensive in responses and does not like/want  to answer questions from therapists.)  Cognition Arousal/Alertness: Awake/alert Behavior During Therapy:  (Defensive) Overall Cognitive Status: Within Functional Limits for tasks assessed                                 General Comments: Pt is defensive and delines to answer home setup questions. Takes MAX encouragement to participate      General Comments General comments (skin integrity, edema, etc.): SpO2 mid 90s on RA at rest, desat high 80s c mobility    Exercises Other Exercises Other Exercises: Pt educated  re: OT role, DME recs, d/c recs, falls prevention, ECS Other Exercises: sup<>sit, sit<>stand, sitting/standing balance/tolerance Other Exercises: Pt educated on role of PT and services provided during hospital stay. Other Exercises: Pt participated in gait training within the ICU room around bed.   Assessment/Plan    PT Assessment Patient needs continued PT services  PT Problem List Decreased strength;Decreased activity tolerance;Decreased balance;Decreased safety awareness;Obesity       PT Treatment Interventions DME instruction;Gait training;Functional mobility training;Therapeutic activities;Therapeutic exercise;Balance training    PT Goals (Current goals can be found in the Care Plan section)  Acute Rehab PT Goals Patient Stated Goal: To return home today PT Goal Formulation: With patient/family Time For Goal Achievement: 11/13/20 Potential to Achieve Goals: Good    Frequency Min 2X/week   Barriers to discharge        Co-evaluation   Reason for Co-Treatment: Necessary to address cognition/behavior during functional activity;To address functional/ADL transfers PT goals addressed during session: Mobility/safety with mobility OT goals addressed during session: ADL's and self-care       AM-PAC PT "6 Clicks" Mobility  Outcome Measure Help needed turning from your back to your side while in a flat bed without using bedrails?: None Help needed moving from lying on your back to sitting on the side of a flat bed without using bedrails?: None Help needed moving to and from a bed to a chair (including a wheelchair)?: None Help needed standing up from a chair using your arms (e.g., wheelchair or bedside chair)?: None Help needed to walk in hospital room?: A Little Help needed climbing 3-5 steps with a railing? : A Lot 6 Click Score: 21    End of Session   Activity Tolerance: Patient tolerated treatment well Patient left: in bed;with call bell/phone within reach;with bed alarm  set;with family/visitor present Nurse Communication: Mobility status PT Visit Diagnosis: Unsteadiness on feet (R26.81);Other abnormalities of gait and mobility (R26.89);Muscle weakness (generalized) (M62.81);Difficulty in walking, not elsewhere classified (R26.2)    Time: 5027-7412 PT Time Calculation (min) (ACUTE ONLY): 12 min   Charges:   PT Evaluation $PT Eval Low Complexity: 1 Low           Nolon Bussing, PT, DPT 10/30/20, 10:50 AM

## 2020-10-30 NOTE — TOC Transition Note (Addendum)
Transition of Care [facility]) - CM/SW Discharge Note   Patient Details  Name: Courtney Anderson MRN: [id number] Date of Birth: 1956-06-01  [facility] CM/SW Contact:  Ova Freshwater Phone Number: [phone] 10/30/2020, 11:43 AM   Clinical Narrative:     CSW spoke with patient about discharge plan.  CSW offered patient EMS and/or [facility] for transportation home.  CSW stated EMS could not guarantee an ETA and [facility] would not be available to transport patient until 3:00PM.  Patient stated she wanted to go home and was not willing to wait, and patient's Courtney Anderson,Courtney Anderson (Son) [phone] stated he would take the patient home. Patient is active with [facility] for PT, RN and [facility] for O2. CSW spoke with ICU/RN for PT, RN home health orders. CSW contacted [facility] and [facility] for patient status update and verified they would continue to follow patient.  Patient will d/c home via car. Attending and ICU/RN updated. TOC consult complete.        Patient Goals and CMS Choice        Discharge Placement                       Discharge Plan and Services                                     Social Determinants of Health (SDOH) Interventions     Readmission Risk Interventions No flowsheet data found.

## 2020-10-30 NOTE — Evaluation (Signed)
Occupational Therapy Evaluation Patient Details Name: Courtney Anderson MRN: [id number] DOB: [birth year] Today's Date: 10/30/2020    History of Present Illness Courtney Anderson is a 64 y.o. F PMH: HTN, hypoTSH, who comes to [facility] on 9/20 after 1 weeks of cough, congestion, weakness, intermittent fever chills. Pt admitted with code sepsis, (+) COVID19. Pt having issues with AF/RVR upon arrival.   Clinical Impression   Courtney Anderson was seen for OT/PT co-evaluation this date. Prior to hospital admission, pt was receiving HHPT and walking c RW household distances. Pt presents to acute OT demonstrating impaired ADL performance and functional mobility 2/2 decreased activity tolerance and decreased LB access. Pt is defensive and delines to answer home setup questions, takes MAX encouragement to participate. SpO2 mid 90s on RA at rest, desat high 80s c mobility - resolves in sitting. Pt currently requires CGA + RW for ADL t/f. Anticipate MIN A for LB access. Pt would benefit from skilled OT to address noted impairments and functional limitations (see below for any additional details) in order to maximize safety and independence while minimizing falls risk and caregiver burden. Upon hospital discharge, recommend HHOT to maximize pt safety and return to functional independence during meaningful occupations of daily life.     Follow Up Recommendations  Home health OT    Equipment Recommendations  None recommended by OT    Recommendations for Other Services       Precautions / Restrictions Precautions Precautions: Fall Restrictions Weight Bearing Restrictions: No      Mobility Bed Mobility Overal bed mobility: Modified Independent             General bed mobility comments: HoB elevated    Transfers Overall transfer level: Needs assistance Equipment used: Rolling walker (2 wheeled) Transfers: Sit to/from Stand Sit to Stand: Min guard;From elevated surface              Balance Overall  balance assessment: Needs assistance Sitting-balance support: No upper extremity supported;Feet supported Sitting balance-Leahy Scale: Fair     Standing balance support: No upper extremity supported;Bilateral upper extremity supported Standing balance-Leahy Scale: Fair                             ADL either performed or assessed with clinical judgement   ADL Overall ADL's : Needs assistance/impaired                                       General ADL Comments: CGA + RW for ADL t/f. Anticipate MIN A for LB access.                  Pertinent Vitals/Pain Pain Assessment: No/denies pain     Hand Dominance Right   Extremity/Trunk Assessment Upper Extremity Assessment Upper Extremity Assessment: Generalized weakness   Lower Extremity Assessment Lower Extremity Assessment: Generalized weakness       Communication Communication Communication: No difficulties   Cognition Arousal/Alertness: Awake/alert Behavior During Therapy:  (Defensive) Overall Cognitive Status: Within Functional Limits for tasks assessed                                 General Comments:  (Pt is defensive and delines to answer home setup questions. Takes MAX encouragement to participate)   General Comments  SpO2 mid 90s on  RA at rest, desat high 80s c mobility    Exercises Exercises: Other exercises Other Exercises Other Exercises: Pt educated re: OT role, DME recs, d/c recs, falls prevention, ECS Other Exercises: sup<>sit, sit<>stand, sitting/standing balance/tolerance   Shoulder Instructions      Home Living Family/patient expects to be discharged to:: Private residence Living Arrangements: Children Available Help at Discharge: Family Type of Home: Mobile home       Home Layout: One level               Home Equipment: Environmental consultant - 2 wheels;Bedside commode;Wheelchair - manual;Hospital bed   Additional Comments: Pt declines answering home setup,  taken from chart review      Prior Functioning/Environment Level of Independence: Independent with assistive device(s)        Comments: Pt reports MOD I household mobility using RW, was receiving HHPT        OT Problem List: Decreased strength;Decreased range of motion;Decreased activity tolerance;Impaired balance (sitting and/or standing);Decreased safety awareness      OT Treatment/Interventions: Self-care/ADL training;Therapeutic exercise;Energy conservation;DME and/or AE instruction;Therapeutic activities;Patient/family education;Balance training    OT Goals(Current goals can be found in the care plan section) Acute Rehab OT Goals Patient Stated Goal: To return home today OT Goal Formulation: With patient/family Time For Goal Achievement: 11/13/20 Potential to Achieve Goals: Good ADL Goals Pt Will Perform Grooming: with modified independence;standing (c LRAD PRN) Pt Will Perform Lower Body Dressing: with supervision;sit to/from stand (c LRAD PRN) Pt Will Transfer to Toilet: with modified independence;ambulating;regular height toilet (c LRAD PRN)  OT Frequency: Min 1X/week           Co-evaluation PT/OT/SLP Co-Evaluation/Treatment: Yes Reason for Co-Treatment: Necessary to address cognition/behavior during functional activity;To address functional/ADL transfers PT goals addressed during session: Mobility/safety with mobility;Proper use of DME OT goals addressed during session: ADL's and self-care      AM-PAC OT "6 Clicks" Daily Activity     Outcome Measure Help from another person eating meals?: None Help from another person taking care of personal grooming?: A Little Help from another person toileting, which includes using toliet, bedpan, or urinal?: A Little Help from another person bathing (including washing, rinsing, drying)?: A Little Help from another person to put on and taking off regular upper body clothing?: None Help from another person to put on and taking  off regular lower body clothing?: A Little 6 Click Score: 20   End of Session Equipment Utilized During Treatment: Rolling walker Nurse Communication: Mobility status  Activity Tolerance: Patient tolerated treatment well Patient left: in bed;with call bell/phone within reach;with family/visitor present  OT Visit Diagnosis: Other abnormalities of gait and mobility (R26.89)                [id number] OT Time Calculation (min): 12 min Charges:  OT General Charges $OT Visit: 1 Visit OT Evaluation $OT Eval Low Complexity: 1 Low  Dessie Coma, M.S. OTR/L  10/30/20, 10:36 AM  ascom [phone]

## 2020-10-30 NOTE — Discharge Summary (Signed)
Physician Discharge Summary  Courtney Anderson [id number] DOB: 01-May-1956 DOA: 10/27/2020  PCP: System, Provider Not In  Admit date: 10/27/2020 Discharge date: 10/30/2020  Admitted From: Home Disposition:  Home  Recommendations for Outpatient Follow-up:  1. Follow up with PCP in 1-2 weeks 2. Follow up with cardiology 1 week 3. Follow up with GI in 1 month  Home Health:Yes Equipment/Devices:None Discharge Condition:Stable CODE STATUS:Full Diet recommendation: Heart Healthy / Carb Modified / Regular / Dysphagia   Brief/Interim Summary: 63yo F w PMH as listed below, notable hx of MICU admission in 07/2020 with septic shock COVID19 needing vasopressor support, noted at that time to have numerous open skin wounds of torso, pelvis , intertriginous areas,complicated by devt of AFrVR was placed on xarelto on outpatient. Grandson who accompanied patient in ED during my evaluation shares patient is very pale, and patient states she noted over past 1-2wk BP has been decreasing so she did not need to take her antihypertensives.  She denied seeing blood in stool.  She was found to be in hemorrhagic shock with Hb 2.7. FOBT + stool.  MICU admission requested for management.   10/28/20- patient clinically improved. Plan for GI scoping. Cardiology consult placed per GI request for clearance to have EGD.   10/29/20- patient s/p EGD no abnormalities noted per GI Dr Marius Ditch.   12/27: Patient seen and examined.  Hemoglobin stable.  Seen by cardiology this morning.  Okay for discharge at this time.  Will discontinue Xarelto at time of discharge.  Follow-up with GI in 1 month.  Follow-up with cardiology in 1 week.   Discharge Diagnoses:  Active Problems:   Hemorrhagic shock (HCC)  Hemorrhagic shock Acute blood loss anemia Hemoglobin 2.7 on admission Transfused with good result Hemoglobin 8.2 on time of discharge Likely driven by chronic anticoagulation Status post EGD, no bleeding noted Okay for  discharge per GI Plan: Discharge home Protonix 40 mg daily x6 weeks Follow-up with GI and in 1 month Hold Xarelto  Hypertension Patient was apparently taken off her blood pressure medications by her primary We will discontinue at time of discharge Follow-up PCP 1 week  Atrial fibrillation Noted on previous prolonged admission Patient was diagnosed with Covid at that time Discharged on amiodarone and Xarelto Plan: Hold Xarelto at discharge Continue amiodarone for now Follow-up with cardiology to determine appropriateness of this medication going forward  Discharge Instructions  Discharge Instructions    Ambulatory referral to Gastroenterology   Complete by: As directed    1 month followup after hospitalization   Diet - low sodium heart healthy   Complete by: As directed    Increase activity slowly   Complete by: As directed      Allergies as of 10/30/2020      Reactions   Codeine       Medication List    STOP taking these medications   olmesartan 20 MG tablet Commonly known as: BENICAR   rivaroxaban 20 MG Tabs tablet Commonly known as: XARELTO     TAKE these medications   amiodarone 200 MG tablet Commonly known as: PACERONE Take 1 tablet (200 mg total) by mouth daily.   aspirin 81 MG tablet Take 81 mg by mouth daily.   multivitamin tablet Take 1 tablet by mouth daily.   pantoprazole 40 MG tablet Commonly known as: Protonix Take 1 tablet (40 mg total) by mouth daily.   Vitamin D 50 MCG (2000 UT) tablet Take 2,000 Units by mouth daily.  Follow-up Information    Corey Skains, MD Follow up in 1 week(s).   Specialty: Cardiology Contact information: [facility] [place] [address] [phone]        Lin Landsman, MD. Schedule an appointment as soon as possible for a visit in 1 month.   Specialty: Gastroenterology Contact information: [place]  [address] [phone]        Corey Skains, MD.   Specialty: Cardiology Contact information: [address] [facility] [place] [address] [phone]        Corey Skains, MD In 1 week.   Specialty: Cardiology Why: APPOINTMENT DR Jeryl Columbia. 03 AT 4 PM Contact information: [address] [facility] [place] [address] [phone]        Lin Landsman, MD In 1 month.   Specialty: Gastroenterology Why: 0910 AM TRIED TO CALL FOR APPT [phone] NO ANSWER LEFT TWO MESSAGES Contact information: [place] [address] [phone]        Lin Landsman, MD In 1 month.   Specialty: Gastroenterology Why: APPOINTMENT Dec 12, 2020 AT 2:30PM DR Loretha Stapler information: [place] [address] [phone]              Allergies  Allergen Reactions  . Codeine     Consultations:  Cardiology-[facility]  GI   Procedures/Studies: DG Chest Portable 1 View  Result Date: 10/27/2020 CLINICAL DATA:  Weakness EXAM: PORTABLE CHEST 1 VIEW COMPARISON:  08/18/2020 FINDINGS: Interval removal of left IJ central line. Stable cardiomegaly. Persistent diffuse interstitial prominence with streaky bibasilar airspace opacities, right greater than left. Overall lung aeration has improved compared to the previous study. No pleural effusion or pneumothorax. IMPRESSION: Improved lung aeration with persistent diffuse interstitial prominence and streaky bibasilar airspace opacities, right greater than left. Electronically Signed   By: Davina Poke D.O.   On: 10/27/2020 14:28    (Echo, Carotid, EGD, Colonoscopy, ERCP)    Subjective: Seen and examined the day of discharge.  Stable, no distress.  Hemoglobin stable.  Stable for discharge home  Discharge Exam: Vitals:   10/30/20 0700 10/30/20 0800  BP: 128/64 135/62  Pulse: 62 62  Resp: 19 19  Temp:   97.8 F (36.6 C)  SpO2: 100% 100%   Vitals:   10/30/20 0500 10/30/20 0600 10/30/20 0700 10/30/20 0800  BP: 132/63  128/64 135/62  Pulse: (!) 58  62 62  Resp: 17 17 19 19   Temp: 98.2 F (36.8 C)   97.8 F (36.6 C)  TempSrc:      SpO2: 100%  100% 100%  Weight: 93 kg     Height:        General: Pt is alert, awake, not in acute distress Cardiovascular: RRR, S1/S2 +, no rubs, no gallops Respiratory: CTA bilaterally, no wheezing, no rhonchi Abdominal: Soft, NT, ND, bowel sounds + Extremities: no edema, no cyanosis    The results of significant diagnostics from this hospitalization (including imaging, microbiology, ancillary and laboratory) are listed below for reference.     Microbiology: Recent Results (from the past 240 hour(s))  Resp Panel by RT-PCR (Flu A&B, Covid) Nasopharyngeal Swab     Status: None   Collection Time: 10/27/20 12:45 PM   Specimen: Nasopharyngeal Swab; Nasopharyngeal(NP) swabs in vial transport medium  Result Value Ref Range Status   SARS Coronavirus 2 by RT PCR NEGATIVE NEGATIVE Final  Comment: (NOTE) SARS-CoV-2 target nucleic acids are NOT DETECTED.  The SARS-CoV-2 RNA is generally detectable in upper respiratory specimens during the acute phase of infection. The lowest concentration of SARS-CoV-2 viral copies this assay can detect is 138 copies/mL. A negative result does not preclude SARS-Cov-2 infection and should not be used as the sole basis for treatment or other patient management decisions. A negative result may occur with  improper specimen collection/handling, submission of specimen other than nasopharyngeal swab, presence of viral mutation(s) within the areas targeted by this assay, and inadequate number of viral copies(<138 copies/mL). A negative result must be combined with clinical observations, patient history, and epidemiological information. The expected result is Negative.  Fact Sheet for Patients:   EntrepreneurPulse.com.au  Fact Sheet for Healthcare Providers:  IncredibleEmployment.be  This test is no t yet approved or cleared by the [place] FDA and  has been authorized for detection and/or diagnosis of SARS-CoV-2 by FDA under an Emergency Use Authorization (EUA). This EUA will remain  in effect (meaning this test can be used) for the duration of the COVID-19 declaration under Section 564(b)(1) of the Act, 21 U.S.C.section 360bbb-3(b)(1), unless the authorization is terminated  or revoked sooner.       Influenza A by PCR NEGATIVE NEGATIVE Final   Influenza B by PCR NEGATIVE NEGATIVE Final    Comment: (NOTE) The Xpert Xpress SARS-CoV-2/FLU/RSV plus assay is intended as an aid in the diagnosis of influenza from Nasopharyngeal swab specimens and should not be used as a sole basis for treatment. Nasal washings and aspirates are unacceptable for Xpert Xpress SARS-CoV-2/FLU/RSV testing.  Fact Sheet for Patients: EntrepreneurPulse.com.au  Fact Sheet for Healthcare Providers: IncredibleEmployment.be  This test is not yet approved or cleared by the [place] FDA and has been authorized for detection and/or diagnosis of SARS-CoV-2 by FDA under an Emergency Use Authorization (EUA). This EUA will remain in effect (meaning this test can be used) for the duration of the COVID-19 declaration under Section 564(b)(1) of the Act, 21 U.S.C. section 360bbb-3(b)(1), unless the authorization is terminated or revoked.  Performed at [facility], [address] [place], [place] [address]   Culture, blood (routine x 2)     Status: None (Preliminary result)   Collection Time: 10/27/20  2:58 PM   Specimen: BLOOD  Result Value Ref Range Status   Specimen Description BLOOD RIGHT ANTECUBITAL  Final   Special Requests   Final    BOTTLES DRAWN AEROBIC AND ANAEROBIC Blood Culture adequate volume    Culture   Final    NO GROWTH 3 DAYS Performed at [facility], [address], [place], [place] [address]    Report Status PENDING  Incomplete  Culture, blood (routine x 2)     Status: None (Preliminary result)   Collection Time: 10/27/20  3:03 PM   Specimen: BLOOD  Result Value Ref Range Status   Specimen Description BLOOD LEFT ANTECUBITAL  Final   Special Requests   Final    BOTTLES DRAWN AEROBIC AND ANAEROBIC Blood Culture adequate volume   Culture   Final    NO GROWTH 3 DAYS Performed at [facility], [address] [place], [place] [address]    Report Status PENDING  Incomplete  MRSA PCR Screening     Status: None   Collection Time: 10/27/20  3:53 PM   Specimen: Nasopharyngeal  Result Value Ref Range Status   MRSA by PCR NEGATIVE NEGATIVE Final    Comment:  The GeneXpert MRSA Assay (FDA approved for NASAL specimens only), is one component of a comprehensive MRSA colonization surveillance program. It is not intended to diagnose MRSA infection nor to guide or monitor treatment for MRSA infections. Performed at [facility], [address] [place], [place] [address]      Labs: BNP (last 3 results) Recent Labs    07/24/20 0914  BNP 111.0*   Basic Metabolic Panel: Recent Labs  Lab 10/27/20 1244 10/28/20 0138 10/29/20 0213 10/30/20 0430  NA 136 138 136 134*  K 4.5 3.9 3.2* 3.4*  CL 105 109 108 104  CO2 20* 22 22 24   GLUCOSE 155* 89 88 85  BUN 15 11 7* 8  CREATININE 1.09* 0.82 0.94 1.08*  CALCIUM 9.7 9.0 9.0 8.9  MG  --  2.0 2.1 1.9  PHOS  --  2.9 2.6 2.8   Liver Function Tests: Recent Labs  Lab 10/27/20 1244  AST 19  ALT 8  ALKPHOS 42  BILITOT 0.6  PROT 6.0*  ALBUMIN 2.9*   No results for input(s): LIPASE, AMYLASE in the last 168 hours. No results for input(s): AMMONIA in the last 168 hours. CBC: Recent Labs  Lab 10/27/20 1244 10/27/20 1457 10/27/20 1945 10/28/20 0138 10/28/20 0523 10/29/20 0213  10/30/20 0430  WBC 16.2* 13.2* 12.1*  --  9.3 8.8 9.2  NEUTROABS 10.0* 11.3* 9.7*  --   --   --   --   HGB 2.7* 2.3* 5.1* 7.4* 8.2* 8.1* 8.3*  HCT 10.1* 8.6* 16.7* 22.1* 23.8* 25.0* 25.5*  MCV 78.3* 80.4 85.6  --  83.5 85.3 84.7  PLT 429* 344 291  --  274 290 297   Cardiac Enzymes: No results for input(s): CKTOTAL, CKMB, CKMBINDEX, TROPONINI in the last 168 hours. BNP: Invalid input(s): POCBNP CBG: Recent Labs  Lab 10/27/20 1550  GLUCAP 102*   D-Dimer No results for input(s): DDIMER in the last 72 hours. Hgb A1c No results for input(s): HGBA1C in the last 72 hours. Lipid Profile No results for input(s): CHOL, HDL, LDLCALC, TRIG, CHOLHDL, LDLDIRECT in the last 72 hours. Thyroid function studies No results for input(s): TSH, T4TOTAL, T3FREE, THYROIDAB in the last 72 hours.  Invalid input(s): FREET3 Anemia work up Recent Labs    10/27/20 1245 10/27/20 1302  VITAMINB12 262  --   FOLATE  --  17.1  FERRITIN  --  24  TIBC  --  463*  IRON  --  13*  RETICCTPCT  --  6.9*   Urinalysis    Component Value Date/Time   COLORURINE YELLOW (A) 07/24/2020 0912   APPEARANCEUR CLOUDY (A) 07/24/2020 0912   APPEARANCEUR Hazy (A) 05/13/2018 1048   LABSPEC 1.015 07/24/2020 0912   PHURINE 5.0 07/24/2020 0912   GLUCOSEU NEGATIVE 07/24/2020 0912   HGBUR LARGE (A) 07/24/2020 0912   BILIRUBINUR NEGATIVE 07/24/2020 0912   BILIRUBINUR Negative 05/13/2018 1048   KETONESUR NEGATIVE 07/24/2020 0912   PROTEINUR 30 (A) 07/24/2020 0912   NITRITE NEGATIVE 07/24/2020 0912   LEUKOCYTESUR LARGE (A) 07/24/2020 0912   Sepsis Labs Invalid input(s): PROCALCITONIN,  WBC,  LACTICIDVEN Microbiology Recent Results (from the past 240 hour(s))  Resp Panel by RT-PCR (Flu A&B, Covid) Nasopharyngeal Swab     Status: None   Collection Time: 10/27/20 12:45 PM   Specimen: Nasopharyngeal Swab; Nasopharyngeal(NP) swabs in vial transport medium  Result Value Ref Range Status   SARS Coronavirus 2 by RT PCR  NEGATIVE NEGATIVE Final    Comment: (NOTE)  SARS-CoV-2 target nucleic acids are NOT DETECTED.  The SARS-CoV-2 RNA is generally detectable in upper respiratory specimens during the acute phase of infection. The lowest concentration of SARS-CoV-2 viral copies this assay can detect is 138 copies/mL. A negative result does not preclude SARS-Cov-2 infection and should not be used as the sole basis for treatment or other patient management decisions. A negative result may occur with  improper specimen collection/handling, submission of specimen other than nasopharyngeal swab, presence of viral mutation(s) within the areas targeted by this assay, and inadequate number of viral copies(<138 copies/mL). A negative result must be combined with clinical observations, patient history, and epidemiological information. The expected result is Negative.  Fact Sheet for Patients:  EntrepreneurPulse.com.au  Fact Sheet for Healthcare Providers:  IncredibleEmployment.be  This test is no t yet approved or cleared by the [place] FDA and  has been authorized for detection and/or diagnosis of SARS-CoV-2 by FDA under an Emergency Use Authorization (EUA). This EUA will remain  in effect (meaning this test can be used) for the duration of the COVID-19 declaration under Section 564(b)(1) of the Act, 21 U.S.C.section 360bbb-3(b)(1), unless the authorization is terminated  or revoked sooner.       Influenza A by PCR NEGATIVE NEGATIVE Final   Influenza B by PCR NEGATIVE NEGATIVE Final    Comment: (NOTE) The Xpert Xpress SARS-CoV-2/FLU/RSV plus assay is intended as an aid in the diagnosis of influenza from Nasopharyngeal swab specimens and should not be used as a sole basis for treatment. Nasal washings and aspirates are unacceptable for Xpert Xpress SARS-CoV-2/FLU/RSV testing.  Fact Sheet for Patients: EntrepreneurPulse.com.au  Fact Sheet for  Healthcare Providers: IncredibleEmployment.be  This test is not yet approved or cleared by the [place] FDA and has been authorized for detection and/or diagnosis of SARS-CoV-2 by FDA under an Emergency Use Authorization (EUA). This EUA will remain in effect (meaning this test can be used) for the duration of the COVID-19 declaration under Section 564(b)(1) of the Act, 21 U.S.C. section 360bbb-3(b)(1), unless the authorization is terminated or revoked.  Performed at [facility], [address] [place], [place] [address]   Culture, blood (routine x 2)     Status: None (Preliminary result)   Collection Time: 10/27/20  2:58 PM   Specimen: BLOOD  Result Value Ref Range Status   Specimen Description BLOOD RIGHT ANTECUBITAL  Final   Special Requests   Final    BOTTLES DRAWN AEROBIC AND ANAEROBIC Blood Culture adequate volume   Culture   Final    NO GROWTH 3 DAYS Performed at [facility], [address] [place], [place] [address]    Report Status PENDING  Incomplete  Culture, blood (routine x 2)     Status: None (Preliminary result)   Collection Time: 10/27/20  3:03 PM   Specimen: BLOOD  Result Value Ref Range Status   Specimen Description BLOOD LEFT ANTECUBITAL  Final   Special Requests   Final    BOTTLES DRAWN AEROBIC AND ANAEROBIC Blood Culture adequate volume   Culture   Final    NO GROWTH 3 DAYS Performed at [facility], [address], [place], [place] [address]    Report Status PENDING  Incomplete  MRSA PCR Screening     Status: None   Collection Time: 10/27/20  3:53 PM   Specimen: Nasopharyngeal  Result Value Ref Range Status   MRSA by PCR NEGATIVE NEGATIVE Final    Comment:  The GeneXpert MRSA Assay (FDA approved for NASAL specimens only), is one component of a comprehensive MRSA colonization surveillance program. It is not intended to diagnose MRSA infection nor to guide or monitor treatment  for MRSA infections. Performed at [facility], [address] [place], [place] [address]      Time coordinating discharge: Over 30 minutes  SIGNED:   Sidney Ace, MD  [facility] 10/30/2020, 12:13 PM Pager   If 7PM-7AM, please contact night-coverage

## 2020-10-30 NOTE — Progress Notes (Signed)
[facility] Cardiology [facility] Encounter Note  Patient: Courtney Anderson / Admit Date: 10/27/2020 / Date of Encounter: 10/30/2020, 7:08 AM   Subjective: Patient doing quite well today.  No evidence of chest discomfort or shortness of breath cough or congestion syncope or dizziness.  Hemoglobin has been stable at this time with no evidence of bleeding.  Colonoscopy and endoscopy.  No primary cause of anemia at this time.  The patient has had a history of atrial fibrillation in the setting of Covid pneumonia and hospitalization evidence of other cardiovascular history requiring further intervention.  The patient has not had any apparent episodes of atrial fibrillation since her [place] pneumonia hospitalization.  Therefore it would be reasonable to discontinue anticoagulation due to concerns of rebleeding and/or significant anemia while watching for recurrence with possibility of atrial fibrillation.  Review of Systems: Positive for: None Negative for: Vision change, hearing change, syncope, dizziness, nausea, vomiting,diarrhea, bloody stool, stomach pain, cough, congestion, diaphoresis, urinary frequency, urinary pain,skin lesions, skin rashes Others previously listed  Objective: Telemetry: Normal sinus rhythm Physical Exam: Blood pressure 132/63, pulse (!) 58, temperature 98.2 F (36.8 C), resp. rate 17, height 5\' 5"  (1.651 m), weight 93 kg, SpO2 100 %. Body mass index is 34.12 kg/m. General: Well developed, well nourished, in no acute distress. Head: Normocephalic, atraumatic, sclera non-icteric, no xanthomas, nares are without discharge. Neck: No apparent masses Lungs: Normal respirations with no wheezes, no rhonchi, no rales , no crackles   Heart: Regular rate and rhythm, normal S1 S2, no murmur, no rub, no gallop, PMI is normal size and placement, carotid upstroke normal without bruit, jugular venous pressure normal Abdomen: Soft, non-tender, non-distended with normoactive bowel  sounds. No hepatosplenomegaly. Abdominal aorta is normal size without bruit Extremities: No edema, no clubbing, no cyanosis, no ulcers,  Peripheral: 2+ radial, 2+ femoral, 2+ dorsal pedal pulses Neuro: Alert and oriented. Moves all extremities spontaneously. Psych:  Responds to questions appropriately with a normal affect.   Intake/Output Summary (Last 24 hours) at 10/30/2020 0708 Last data filed at 10/30/2020 0600 Gross per 24 hour  Intake 980 ml  Output 1680 ml  Net -700 ml    Inpatient Medications:  . sodium chloride   Intravenous Once  . Chlorhexidine Gluconate Cloth  6 each Topical Q0600  . pantoprazole (PROTONIX) IV  40 mg Intravenous QHS   Infusions:  . sodium chloride Stopped (10/29/20 0834)    Labs: Recent Labs    10/29/20 0213 10/30/20 0430  NA 136 134*  K 3.2* 3.4*  CL 108 104  CO2 22 24  GLUCOSE 88 85  BUN 7* 8  CREATININE 0.94 1.08*  CALCIUM 9.0 8.9  MG 2.1 1.9  PHOS 2.6 2.8   Recent Labs    10/27/20 1244  AST 19  ALT 8  ALKPHOS 42  BILITOT 0.6  PROT 6.0*  ALBUMIN 2.9*   Recent Labs    10/27/20 1457 10/27/20 1945 10/28/20 0138 10/29/20 0213 10/30/20 0430  WBC 13.2* 12.1*   < > 8.8 9.2  NEUTROABS 11.3* 9.7*  --   --   --   HGB 2.3* 5.1*   < > 8.1* 8.3*  HCT 8.6* 16.7*   < > 25.0* 25.5*  MCV 80.4 85.6   < > 85.3 84.7  PLT 344 291   < > 290 297   < > = values in this interval not displayed.   No results for input(s): CKTOTAL, CKMB, TROPONINI in the last 72 hours. Invalid input(s): POCBNP  No results for input(s): HGBA1C in the last 72 hours.   Weights: Filed Weights   10/28/20 0354 10/29/20 0433 10/30/20 0500  Weight: 99.9 kg 87.8 kg 93 kg     Radiology/Studies:  DG Chest Portable 1 View  Result Date: 10/27/2020 CLINICAL DATA:  Weakness EXAM: PORTABLE CHEST 1 VIEW COMPARISON:  08/18/2020 FINDINGS: Interval removal of left IJ central line. Stable cardiomegaly. Persistent diffuse interstitial prominence with streaky bibasilar  airspace opacities, right greater than left. Overall lung aeration has improved compared to the previous study. No pleural effusion or pneumothorax. IMPRESSION: Improved lung aeration with persistent diffuse interstitial prominence and streaky bibasilar airspace opacities, right greater than left. Electronically Signed   By: Duanne Guess D.O.   On: 10/27/2020 14:28     Assessment and Recommendation  64 y.o. female with hypertension hyperlipidemia and acute significant anemia without evidence of bleeding because previously on Xarelto for which may have contributed to a great deal.  With colonoscopy showing no evidence of source of bleeding in the patient having apparent isolated episodes of atrial fibrillation during Covid pneumonia would suggest that the patient may be able to discontinue Xarelto moving forward 1.  Abstain from restarting Xarelto due to significant anemia and the possibility for the patient had isolated episode of atrial fibrillation and will not recur 2.  Begin ambulation and follow for improvements of symptoms and watch for atrial fibrillation but okay for discharge home from cardiac standpoint with follow-up in 1 to 2 weeks for further assessment of issues listed above. 3.  Please call if further questions arise  Signed, Arnoldo Hooker M.D. FACC

## 2020-10-31 LAB — SURGICAL PATHOLOGY

## 2020-11-01 DIAGNOSIS — E0590 Thyrotoxicosis, unspecified without thyrotoxic crisis or storm: Secondary | ICD-10-CM | POA: Diagnosis not present

## 2020-11-01 DIAGNOSIS — J9601 Acute respiratory failure with hypoxia: Secondary | ICD-10-CM | POA: Diagnosis not present

## 2020-11-01 DIAGNOSIS — J1282 Pneumonia due to coronavirus disease 2019: Secondary | ICD-10-CM | POA: Diagnosis not present

## 2020-11-01 DIAGNOSIS — I452 Bifascicular block: Secondary | ICD-10-CM | POA: Diagnosis not present

## 2020-11-01 DIAGNOSIS — N179 Acute kidney failure, unspecified: Secondary | ICD-10-CM | POA: Diagnosis not present

## 2020-11-01 DIAGNOSIS — U071 COVID-19: Secondary | ICD-10-CM | POA: Diagnosis not present

## 2020-11-01 DIAGNOSIS — A419 Sepsis, unspecified organism: Secondary | ICD-10-CM | POA: Diagnosis not present

## 2020-11-01 DIAGNOSIS — I493 Ventricular premature depolarization: Secondary | ICD-10-CM | POA: Diagnosis not present

## 2020-11-01 DIAGNOSIS — I480 Paroxysmal atrial fibrillation: Secondary | ICD-10-CM | POA: Diagnosis not present

## 2020-11-01 DIAGNOSIS — M6281 Muscle weakness (generalized): Secondary | ICD-10-CM | POA: Diagnosis not present

## 2020-11-01 DIAGNOSIS — E785 Hyperlipidemia, unspecified: Secondary | ICD-10-CM | POA: Diagnosis not present

## 2020-11-01 DIAGNOSIS — E039 Hypothyroidism, unspecified: Secondary | ICD-10-CM | POA: Diagnosis not present

## 2020-11-01 DIAGNOSIS — L89152 Pressure ulcer of sacral region, stage 2: Secondary | ICD-10-CM | POA: Diagnosis not present

## 2020-11-01 LAB — CULTURE, BLOOD (ROUTINE X 2)
Culture: NO GROWTH
Culture: NO GROWTH
Special Requests: ADEQUATE
Special Requests: ADEQUATE

## 2020-11-02 ENCOUNTER — Encounter: Payer: Self-pay | Admitting: Gastroenterology

## 2020-11-02 ENCOUNTER — Telehealth: Payer: Self-pay

## 2020-11-02 NOTE — Telephone Encounter (Signed)
Called patient to set up appointment for Hospital follow up an patient refused, stated that she has too many appts. Next week and will call when she is ready to come in.

## 2020-11-08 ENCOUNTER — Telehealth: Payer: Self-pay | Admitting: Nurse Practitioner

## 2020-11-08 NOTE — Telephone Encounter (Signed)
Okay to provide verbal orders from me.  Thank you.

## 2020-11-08 NOTE — Telephone Encounter (Signed)
Routing to provider  

## 2020-11-08 NOTE — Telephone Encounter (Signed)
Called and notified Thayer Ohm that [place] gave the ok for verbal orders.

## 2020-11-08 NOTE — Telephone Encounter (Signed)
Thayer Ohm PT with [facility] is calling and would like verbal orders for Physical therapy 2x7

## 2020-11-20 ENCOUNTER — Telehealth: Payer: Self-pay | Admitting: Pharmacist

## 2020-11-20 NOTE — Progress Notes (Signed)
    Chronic Care Management Pharmacy Assistant   Name: Courtney Anderson  MRN: [id number] DOB: 1956/07/01  Reason for Encounter: Chronic Care Management Follow Up    PCP : Guadalupe Maple, MD  Allergies:   Allergies  Allergen Reactions  . Codeine     Medications: Outpatient Encounter Medications as of 11/20/2020  Medication Sig  . amiodarone (PACERONE) 200 MG tablet Take 1 tablet (200 mg total) by mouth daily.  Marland Kitchen aspirin 81 MG tablet Take 81 mg by mouth daily.  . Cholecalciferol (VITAMIN D) 50 MCG (2000 UT) tablet Take 2,000 Units by mouth daily.  . Multiple Vitamin (MULTIVITAMIN) tablet Take 1 tablet by mouth daily.  . pantoprazole (PROTONIX) 40 MG tablet Take 1 tablet (40 mg total) by mouth daily.   No facility-administered encounter medications on file as of 11/20/2020.    Current Diagnosis: Patient Active Problem List   Diagnosis Date Noted  . Hemorrhagic shock ([place]) 10/27/2020  . Sacral wound 09/06/2020  . Aspiration into airway   . Acute kidney failure, unspecified ([place]) 07/24/2020  . Acute hypoxemic respiratory failure due to COVID-19 ([place]) 07/24/2020  . Atrial fibrillation with RVR ([place]) 07/24/2020  . Vitamin D deficiency 10/17/2019  . 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 Hypercalcemia 08/20/2018  . Hyperthyroidism 08/20/2018  . Multinodular goiter 08/20/2018  . Hypertension 05/31/2015  . Morbid obesity ([place]) 05/31/2015  . Elevated transaminase level 05/31/2015  . Hyperlipidemia 05/31/2015    11-20-2020: Attempted to reach patient after recent hospitalizations. Called preferred phone number but no answer. Left HIPAA compliant voicemail requesting a call back.   11-22-2020: Second attempt to contact the patient. Again no answer. Left voicemail requesting a call back.   11-24-20: Third attempt to contact the patient. No answer or call back from previous attempts. Will attempt next month to speak with the patient. PharmD Birdena Crandall made aware of the  failed attempts.   [name], LPN Clinical Pharmacist Assistant  [phone]    Follow-Up:  Pharmacist Review

## 2020-11-21 ENCOUNTER — Telehealth: Payer: Self-pay | Admitting: Family Medicine

## 2020-11-21 NOTE — Telephone Encounter (Signed)
I am okay with verbal orders being provided, please alert them.  Thank you.

## 2020-11-21 NOTE — Telephone Encounter (Signed)
Tiffany with [facility] calling to get verbal home health skilled nursing orders to restart plan of care due to new insurance change first of year.  Cb: [phone]  opt 2

## 2020-11-21 NOTE — Telephone Encounter (Signed)
Called and let Tiffany know that Jolene gave the ok for the verbal order requested.

## 2020-11-24 ENCOUNTER — Encounter: Payer: Self-pay | Admitting: Nurse Practitioner

## 2020-11-24 DIAGNOSIS — D6869 Other thrombophilia: Secondary | ICD-10-CM | POA: Insufficient documentation

## 2020-11-24 DIAGNOSIS — Z8616 Personal history of COVID-19: Secondary | ICD-10-CM | POA: Insufficient documentation

## 2020-11-27 ENCOUNTER — Telehealth: Payer: Self-pay | Admitting: Nurse Practitioner

## 2020-11-27 NOTE — Telephone Encounter (Signed)
Okay for verbal 

## 2020-11-27 NOTE — Telephone Encounter (Signed)
Verbal order given  

## 2020-11-27 NOTE — Telephone Encounter (Signed)
Copied from [place] [id number]. Topic: Quick Communication - Home Health Verbal Orders >> Nov 27, 2020 10:07 AM Yvette Rack wrote: Caller/Agency: Gerald Stabs with [facility]  Callback Number: [phone] Requesting OT/PT/Skilled Nursing/Social Work/Speech Therapy: PT  Frequency: 2 times a week for 8 weeks

## 2020-11-27 NOTE — Telephone Encounter (Signed)
Ok for verbal orders ?

## 2020-11-28 ENCOUNTER — Ambulatory Visit: Payer: Medicare Other | Admitting: Physician Assistant

## 2020-12-01 ENCOUNTER — Encounter: Payer: Self-pay | Admitting: Nurse Practitioner

## 2020-12-01 ENCOUNTER — Ambulatory Visit (INDEPENDENT_AMBULATORY_CARE_PROVIDER_SITE_OTHER): Payer: Medicare Other | Admitting: Nurse Practitioner

## 2020-12-01 ENCOUNTER — Other Ambulatory Visit: Payer: Self-pay

## 2020-12-01 VITALS — BP 136/80 | HR 65 | Temp 98.3°F

## 2020-12-01 DIAGNOSIS — I10 Essential (primary) hypertension: Secondary | ICD-10-CM | POA: Diagnosis not present

## 2020-12-01 DIAGNOSIS — Z8616 Personal history of COVID-19: Secondary | ICD-10-CM

## 2020-12-01 DIAGNOSIS — E538 Deficiency of other specified B group vitamins: Secondary | ICD-10-CM

## 2020-12-01 DIAGNOSIS — E782 Mixed hyperlipidemia: Secondary | ICD-10-CM

## 2020-12-01 DIAGNOSIS — I4891 Unspecified atrial fibrillation: Secondary | ICD-10-CM | POA: Diagnosis not present

## 2020-12-01 DIAGNOSIS — E0590 Thyrotoxicosis, unspecified without thyrotoxic crisis or storm: Secondary | ICD-10-CM

## 2020-12-01 DIAGNOSIS — D649 Anemia, unspecified: Secondary | ICD-10-CM | POA: Diagnosis not present

## 2020-12-01 DIAGNOSIS — E559 Vitamin D deficiency, unspecified: Secondary | ICD-10-CM

## 2020-12-01 NOTE — Assessment & Plan Note (Signed)
Related to recent GI bleed, with D/C labs continuing to note low HGB.  Recommend continue iron supplement at home.  Check CBC, B12, iron, ferritin today.  Continue to collaborate with GI.  Consider referral to hematology if ongoing.  Return in 6 weeks to met new PCP and for follow-up.

## 2020-12-01 NOTE — Assessment & Plan Note (Signed)
Ongoing, followed by cardiology.   At this time will continue Amiodarone, no DOAC or ASA due to bleed.  Continue to collaborate with cardiology, appreciate their input.  Plan for home health RN to continue.  Labs today: CBC and CMP.

## 2020-12-01 NOTE — Assessment & Plan Note (Signed)
Recheck Vit D level today and continue current supplement.  Continue collaboration with endocrinology as needed.

## 2020-12-01 NOTE — Assessment & Plan Note (Signed)
Recommend continued focus on healthy diet choices and regular physical activity (30 minutes 5 days a week).  Focus on small goals at a time. 

## 2020-12-01 NOTE — Assessment & Plan Note (Signed)
Chronic, ongoing.  No current statin.  Lipid panel today. 

## 2020-12-01 NOTE — Assessment & Plan Note (Signed)
Chronic, ongoing no current medications.  Initial BP elevated, but repeat closer to goal. Home BP average at goal.  If medication needed would restart slowly, consider Losartan for kidney protection.  Recommend checking BP at home daily and documenting for provider + focus on DASH diet.  CMP and TSH today.  Return in 6 weeks.

## 2020-12-01 NOTE — Assessment & Plan Note (Signed)
Chronic, ongoing, previously followed by endo.  Check TSH and Free T4 today.  Return to endo as needed based on labs and symptoms.

## 2020-12-01 NOTE — Assessment & Plan Note (Signed)
Admitted to hospital 07/24/20, currently reports overall feeling better.  Continue to monitor for any long haul symptoms and address as needed.

## 2020-12-01 NOTE — Progress Notes (Signed)
BP 136/80 (BP Location: Left Arm)   Pulse 65   Temp 98.3 F (36.8 C) (Oral)   SpO2 96%    Subjective:    Patient ID: Courtney Anderson, female    DOB: Apr 28, 1956, 65 y.o.   MRN: [id number]  HPI: Courtney Anderson is a 65 y.o. female  Chief Complaint  Patient presents with  . Hosp F/U    Transition of [facility] Follow up.  Was diagnosed with A-Fib on 07/24/20 placed on DOAC, this took place with Covid diagnosis at that time and respiratory failure.  Then on 10/27/20 was admitted for anemia with bleed.  Was seen by Dr. [name] on 11/06/20 -- he was taken off ASA and DOAC due to bleed.  Is taking Amiodarone at this time, but reports cardiology may take her off of this when Courtney Anderson returns. In past has taken Tribenzor, Benicar, Metoprolol.  Overall at this time is feeling better and feels better not being on multiple medications, as was previously.  Continues with nursing and PT at home -- PT comes twice a week and nursing comes out one day a week (Amy).    "Admit date: 10/27/2020 Discharge date: 10/30/2020  Admitted From: Home Disposition:  Home  Recommendations for Outpatient Follow-up:  1. Follow up with PCP in 1-2 weeks 2. Follow up with cardiology 1 week 3. Follow up with GI in 1 month  Home Health:Yes Equipment/Devices:None Discharge Condition:Stable CODE STATUS:Full Diet recommendation: Heart Healthy / Carb Modified / Regular / Dysphagia   Brief/Interim Summary: 65yo F w PMH as listed below, notable hx of MICU admission in 07/2020 with septic shock COVID19 needing vasopressor support, noted at that time to have numerous open skin wounds of torso, pelvis , intertriginous areas,complicated by devt of AFrVR was placed on xarelto on outpatient. Grandson who accompanied patient in ED during my evaluation shares patient is very pale, and patient states Courtney Anderson noted over past 1-2wk BP has been decreasing so Courtney Anderson did not need to take her antihypertensives. Courtney Anderson denied seeing blood in stool.  Courtney Anderson was found to be in hemorrhagic shock with Hb 2.7. FOBT + stool. MICU admission requested for management.   10/28/20-patient clinically improved. Plan for GI scoping. Cardiology consult placed per GI request for clearance to have EGD.   10/29/20-patient s/p EGD no abnormalities noted per GI Dr Marius Ditch.   12/27: Patient seen and examined.  Hemoglobin stable.  Seen by cardiology this morning.  Okay for discharge at this time.  Will discontinue Xarelto at time of discharge.  Follow-up with GI in 1 month.  Follow-up with cardiology in 1 week.   Discharge Diagnoses:  Active Problems:   Hemorrhagic shock (HCC)  Hemorrhagic shock Acute blood loss anemia Hemoglobin 2.7 on admission Transfused with good result Hemoglobin 8.2 on time of discharge Likely driven by chronic anticoagulation Status post EGD, no bleeding noted Okay for discharge per GI Plan: Discharge home Protonix 40 mg daily x6 weeks Follow-up with GI and in 1 month Hold Xarelto  Hypertension Patient was apparently taken off her blood pressure medications by her primary We will discontinue at time of discharge Follow-up PCP 1 week  Atrial fibrillation Noted on previous prolonged admission Patient was diagnosed with Covid at that time Discharged on amiodarone and Xarelto Plan: Hold Xarelto at discharge Continue amiodarone for now Follow-up with cardiology to determine appropriateness of this medication going forward"  Hospital/Facility: [facility] D/C Physician: Dr. Priscella Mann D/C Date: 10/30/20  Records Requested: 12/01/20 Records Received: 12/01/20 Records  Reviewed: 12/01/20  Diagnoses on Discharge: Hemorrhagic shock  Date of interactive Contact within 48 hours of discharge:  Contact was through: phone  Date of 7 day or 14 day face-to-face visit:    within 14 days  Outpatient Encounter Medications as of 12/01/2020  Medication Sig  . Cholecalciferol (VITAMIN D) 50 MCG (2000 UT) tablet Take 2,000 Units  by mouth daily.  . Multiple Vitamin (MULTIVITAMIN) tablet Take 1 tablet by mouth daily.  . pantoprazole (PROTONIX) 40 MG tablet Take 1 tablet (40 mg total) by mouth daily.  Marland Kitchen amiodarone (PACERONE) 200 MG tablet Take 1 tablet (200 mg total) by mouth daily.  . [DISCONTINUED] aspirin 81 MG tablet Take 81 mg by mouth daily. (Patient not taking: Reported on 12/01/2020)   No facility-administered encounter medications on file as of 12/01/2020.    Diagnostic Tests Reviewed/Disposition: multiple imaging reviewed in chart -- D/C CBC continued to show low H/H  Consults: GI  Discharge Instructions: Follow-up with PCP, cardiology, and GI provider  Disease/illness Education: Discussed with patient today at bedside  Home Health/Community Services Discussions/Referrals: continue with current PT and nursing home care  Establishment or re-establishment of referral orders for community resources: none  Discussion with other health care providers: reviewed all recent notes  Assessment and Support of treatment regimen adherence: Reviewed with patient  Appointments Coordinated with: Reviewed with patient  Education for self-management, independent living, and ADLs: Reviewed with patient  HYPERTHYROIDISM Previously saw Dr. Honor Junes 04/30/19 -- no medications at this time. Fatigue: no Cold intolerance: no Heat intolerance: no Weight gain: no Weight loss: no Constipation: no Diarrhea/loose stools: no Palpitations: no Lower extremity edema: no Anxiety/depressed mood: no   HYPERTENSION / HYPERLIPIDEMIA No blood pressure medications at this time -- taken off of these due to low readings.  At home with nursing has consistently been <130/80.   No current statin therapy. Satisfied with current treatment? yes Duration of hypertension: chronic BP monitoring frequency: a few times a week BP range: 128/70 to 140/80 -- averaging <130/80 per report BP medication side effects: no Duration of  hyperlipidemia: chronic Recent stressors: no Recurrent headaches: no Visual changes: no Palpitations: no Dyspnea: no Chest pain: no Lower extremity edema: no Dizzy/lightheaded: no  Relevant past medical, surgical, family and social history reviewed and updated as indicated. Interim medical history since our last visit reviewed. Allergies and medications reviewed and updated.  Review of Systems  Constitutional: Negative for activity change, appetite change, diaphoresis, fatigue and fever.  Respiratory: Negative for cough (has improved), chest tightness, shortness of breath and wheezing.   Cardiovascular: Negative for chest pain, palpitations and leg swelling.  Gastrointestinal: Negative.   Endocrine: Negative for cold intolerance and heat intolerance.  Neurological: Negative.   Psychiatric/Behavioral: Negative.     Per HPI unless specifically indicated above     Objective:    BP 136/80 (BP Location: Left Arm)   Pulse 65   Temp 98.3 F (36.8 C) (Oral)   SpO2 96%   Wt Readings from Last 3 Encounters:  10/30/20 205 lb 0.4 oz (93 kg)  08/28/20 218 lb 3.2 oz (99 kg)  10/22/19 242 lb (109.8 kg)    Physical Exam Vitals and nursing note reviewed.  Constitutional:      General: Courtney Anderson is awake. Courtney Anderson is not in acute distress.    Appearance: Courtney Anderson is well-developed and well-groomed. Courtney Anderson is not ill-appearing.  HENT:     Head: Normocephalic.     Right Ear: Hearing normal.  Left Ear: Hearing normal.     Mouth/Throat:     Mouth: Mucous membranes are moist.     Comments: Slightly pale mucus membranes Eyes:     General: Lids are normal.        Right eye: No discharge.        Left eye: No discharge.     Conjunctiva/sclera: Conjunctivae normal.     Pupils: Pupils are equal, round, and reactive to light.  Neck:     Thyroid: No thyromegaly.     Vascular: No carotid bruit.  Cardiovascular:     Rate and Rhythm: Normal rate and regular rhythm.     Heart sounds: Normal heart sounds.  No murmur heard. No gallop.   Pulmonary:     Effort: Pulmonary effort is normal. No accessory muscle usage or respiratory distress.     Breath sounds: Normal breath sounds.  Abdominal:     General: Bowel sounds are normal.     Palpations: Abdomen is soft.  Musculoskeletal:     Cervical back: Normal range of motion and neck supple.     Right lower leg: No edema.     Left lower leg: No edema.  Lymphadenopathy:     Cervical: No cervical adenopathy.  Skin:    General: Skin is warm and dry.  Neurological:     Mental Status: Courtney Anderson is alert and oriented to person, place, and time.  Psychiatric:        Attention and Perception: Attention normal.        Mood and Affect: Mood normal.        Speech: Speech normal.        Behavior: Behavior normal. Behavior is cooperative.        Thought Content: Thought content normal.     Results for orders placed or performed in visit on 10/31/20  CBC and differential  Result Value Ref Range   Hemoglobin 2.6 (A) 12.0 - 16.0   HCT 10 (A) 36 - 46   Neutrophils Absolute 5.80    Platelets 358 150 - 399   WBC 9.2   CBC  Result Value Ref Range   RBC 1.23 (A) 3.87 - 9.62  Basic metabolic panel  Result Value Ref Range   Glucose 108    BUN 11 4 - 21   CO2 20 13 - 22   Creatinine 1.1 0.5 - 1.1   Potassium 4.1 3.4 - 5.3   Sodium 136 (A) 137 - 147   Chloride 104 99 - 108  Comprehensive metabolic panel  Result Value Ref Range   Globulin 2.6    GFR calc Af Amer 61    GFR calc non Af Amer 53    Calcium 9.3 8.7 - 10.7   Albumin 3.2 (A) 3.5 - 5.0  Lipid panel  Result Value Ref Range   Triglycerides 68 40 - 160   Cholesterol 127 0 - 200   HDL 33 (A) 35 - 70   LDL Cholesterol 80   Hepatic function panel  Result Value Ref Range   Alkaline Phosphatase 48 25 - 125   ALT 5 (A) 7 - 35   AST 12 (A) 13 - 35   Bilirubin, Total 0.2   TSH  Result Value Ref Range   TSH 38.80 (A) 0.41 - 5.90      Assessment & Plan:   Problem List Items Addressed This  Visit      Cardiovascular and Mediastinum   Hypertension  Chronic, ongoing no current medications.  Initial BP elevated, but repeat closer to goal. Home BP average at goal.  If medication needed would restart slowly, consider Losartan for kidney protection.  Recommend checking BP at home daily and documenting for provider + focus on DASH diet.  CMP and TSH today.  Return in 6 weeks.      Relevant Orders   Comprehensive metabolic panel   Atrial fibrillation with RVR ([place]) - Primary    Ongoing, followed by cardiology.   At this time will continue Amiodarone, no DOAC or ASA due to bleed.  Continue to collaborate with cardiology, appreciate their input.  Plan for home health RN to continue.  Labs today: CBC and CMP.          Endocrine   Hyperthyroidism    Chronic, ongoing, previously followed by endo.  Check TSH and Free T4 today.  Return to endo as needed based on labs and symptoms.      Relevant Orders   TSH   T4, free     Other   Morbid obesity ([place])    Recommend continued focus on healthy diet choices and regular physical activity (30 minutes 5 days a week).  Focus on small goals at a time.      Hyperlipidemia    Chronic, ongoing.  No current statin.  Lipid panel today.      Relevant Orders   Lipid Panel w/o Chol/HDL Ratio   Vitamin D deficiency    Recheck Vit D level today and continue current supplement.  Continue collaboration with endocrinology as needed.      Relevant Orders   VITAMIN D 25 Hydroxy (Vit-D Deficiency, Fractures)   History of 2019 novel coronavirus disease (COVID-19)    Admitted to hospital 07/24/20, currently reports overall feeling better.  Continue to monitor for any long haul symptoms and address as needed.      Anemia    Related to recent GI bleed, with D/C labs continuing to note low HGB.  Recommend continue iron supplement at home.  Check CBC, B12, iron, ferritin today.  Continue to collaborate with GI.  Consider referral to hematology if  ongoing.  Return in 6 weeks to met new PCP and for follow-up.      Relevant Orders   CBC with Differential/Platelet   Vitamin B12   Iron, TIBC and Ferritin Panel    Other Visit Diagnoses    B12 deficiency       History of low levels reported, check B12 level today and supplement as needed.   Relevant Orders   Vitamin B12      Time: > 25 minutes, >50% spent counseling/or care coordination   Follow up plan: Return in about 6 weeks (around 01/12/2021) for Chronic disease visit and meet new provider.

## 2020-12-01 NOTE — Patient Instructions (Signed)
[employer] (25th ed., pp. [phone]). [place], [place]: Elsevier.">  Anemia  Anemia is a condition in which there is not enough red blood cells or hemoglobin in the blood. Hemoglobin is a substance in red blood cells that carries oxygen. When you do not have enough red blood cells or hemoglobin (are anemic), your body cannot get enough oxygen and your organs may not work properly. As a result, you may feel very tired or have other problems. What are the causes? Common causes of anemia include:  Excessive bleeding. Anemia can be caused by excessive bleeding inside or outside the body, including bleeding from the intestines or from heavy menstrual periods in females.  Poor nutrition.  Long-lasting (chronic) kidney, thyroid, and liver disease.  Bone marrow disorders, spleen problems, and blood disorders.  Cancer and treatments for cancer.  HIV (human immunodeficiency virus) and AIDS (acquired immunodeficiency syndrome).  Infections, medicines, and autoimmune disorders that destroy red blood cells. What are the signs or symptoms? Symptoms of this condition include:  Minor weakness.  Dizziness.  Headache, or difficulties concentrating and sleeping.  Heartbeats that feel irregular or faster than normal (palpitations).  Shortness of breath, especially with exercise.  Pale skin, lips, and nails, or cold hands and feet.  Indigestion and nausea. Symptoms may occur suddenly or develop slowly. If your anemia is mild, you may not have symptoms. How is this diagnosed? This condition is diagnosed based on blood tests, your medical history, and a physical exam. In some cases, a test may be needed in which cells are removed from the soft tissue inside of a bone and looked at under a microscope (bone marrow biopsy). Your health care provider may also check your stool (feces) for blood and may do additional testing to look for the cause of your bleeding. Other tests may  include:  Imaging tests, such as a CT scan or MRI.  A procedure to see inside your esophagus and stomach (endoscopy).  A procedure to see inside your colon and rectum (colonoscopy). How is this treated? Treatment for this condition depends on the cause. If you continue to lose a lot of blood, you may need to be treated at a hospital. Treatment may include:  Taking supplements of iron, vitamin Q68, or folic acid.  Taking a hormone medicine (erythropoietin) that can help to stimulate red blood cell growth.  Having a blood transfusion. This may be needed if you lose a lot of blood.  Making changes to your diet.  Having surgery to remove your spleen. Follow these instructions at home:  Take over-the-counter and prescription medicines only as told by your health care provider.  Take supplements only as told by your health care provider.  Follow any diet instructions that you were given by your health care provider.  Keep all follow-up visits as told by your health care provider. This is important. Contact a health care provider if:  You develop new bleeding anywhere in the body. Get help right away if:  You are very weak.  You are short of breath.  You have pain in your abdomen or chest.  You are dizzy or feel faint.  You have trouble concentrating.  You have bloody stools, black stools, or tarry stools.  You vomit repeatedly or you vomit up blood. These symptoms may represent a serious problem that is an emergency. Do not wait to see if the symptoms will go away. Get medical help right away. Call your local emergency services (911 in the [place] Do not  drive yourself to the hospital. Summary  Anemia is a condition in which you do not have enough red blood cells or enough of a substance in your red blood cells that carries oxygen (hemoglobin).  Symptoms may occur suddenly or develop slowly.  If your anemia is mild, you may not have symptoms.  This condition is  diagnosed with blood tests, a medical history, and a physical exam. Other tests may be needed.  Treatment for this condition depends on the cause of the anemia. This information is not intended to replace advice given to you by your health care provider. Make sure you discuss any questions you have with your health care provider. Document Revised: 09/28/2019 Document Reviewed: 09/28/2019 Elsevier Patient Education  2021 Elsevier Inc.  

## 2020-12-02 ENCOUNTER — Encounter: Payer: Self-pay | Admitting: Nurse Practitioner

## 2020-12-02 ENCOUNTER — Other Ambulatory Visit: Payer: Self-pay | Admitting: Nurse Practitioner

## 2020-12-02 DIAGNOSIS — E210 Primary hyperparathyroidism: Secondary | ICD-10-CM | POA: Insufficient documentation

## 2020-12-02 DIAGNOSIS — E039 Hypothyroidism, unspecified: Secondary | ICD-10-CM | POA: Insufficient documentation

## 2020-12-02 LAB — CBC WITH DIFFERENTIAL/PLATELET
Basophils Absolute: 0.1 10*3/uL (ref 0.0–0.2)
Basos: 1 %
EOS (ABSOLUTE): 0.3 10*3/uL (ref 0.0–0.4)
Eos: 4 %
Hematocrit: 28.7 % — ABNORMAL LOW (ref 34.0–46.6)
Hemoglobin: 8.6 g/dL — ABNORMAL LOW (ref 11.1–15.9)
Immature Grans (Abs): 0 10*3/uL (ref 0.0–0.1)
Immature Granulocytes: 0 %
Lymphocytes Absolute: 2.1 10*3/uL (ref 0.7–3.1)
Lymphs: 27 %
MCH: 22.7 pg — ABNORMAL LOW (ref 26.6–33.0)
MCHC: 30 g/dL — ABNORMAL LOW (ref 31.5–35.7)
MCV: 76 fL — ABNORMAL LOW (ref 79–97)
Monocytes Absolute: 0.5 10*3/uL (ref 0.1–0.9)
Monocytes: 7 %
Neutrophils Absolute: 4.7 10*3/uL (ref 1.4–7.0)
Neutrophils: 61 %
Platelets: 436 10*3/uL (ref 150–450)
RBC: 3.79 x10E6/uL (ref 3.77–5.28)
RDW: 18.9 % — ABNORMAL HIGH (ref 11.7–15.4)
WBC: 7.7 10*3/uL (ref 3.4–10.8)

## 2020-12-02 LAB — LIPID PANEL W/O CHOL/HDL RATIO
Cholesterol, Total: 202 mg/dL — ABNORMAL HIGH (ref 100–199)
HDL: 52 mg/dL (ref >39)
LDL Chol Calc (NIH): 137 mg/dL — ABNORMAL HIGH (ref 0–99)
Triglycerides: 71 mg/dL (ref 0–149)
VLDL Cholesterol Cal: 13 mg/dL (ref 5–40)

## 2020-12-02 LAB — TSH: TSH: 71.2 u[IU]/mL — ABNORMAL HIGH (ref 0.450–4.500)

## 2020-12-02 LAB — COMPREHENSIVE METABOLIC PANEL
ALT: 8 IU/L (ref 0–32)
AST: 21 IU/L (ref 0–40)
Albumin/Globulin Ratio: 1.1 — ABNORMAL LOW (ref 1.2–2.2)
Albumin: 3.8 g/dL (ref 3.8–4.8)
Alkaline Phosphatase: 74 IU/L (ref 44–121)
BUN/Creatinine Ratio: 11 — ABNORMAL LOW (ref 12–28)
BUN: 12 mg/dL (ref 8–27)
Bilirubin Total: 0.3 mg/dL (ref 0.0–1.2)
CO2: 24 mmol/L (ref 20–29)
Calcium: 10.4 mg/dL — ABNORMAL HIGH (ref 8.7–10.3)
Chloride: 102 mmol/L (ref 96–106)
Creatinine, Ser: 1.12 mg/dL — ABNORMAL HIGH (ref 0.57–1.00)
GFR calc Af Amer: 60 mL/min/{1.73_m2} (ref >59)
GFR calc non Af Amer: 52 mL/min/{1.73_m2} — ABNORMAL LOW (ref >59)
Globulin, Total: 3.6 g/dL (ref 1.5–4.5)
Glucose: 85 mg/dL (ref 65–99)
Potassium: 4 mmol/L (ref 3.5–5.2)
Sodium: 139 mmol/L (ref 134–144)
Total Protein: 7.4 g/dL (ref 6.0–8.5)

## 2020-12-02 LAB — VITAMIN B12: Vitamin B-12: 530 pg/mL (ref 232–1245)

## 2020-12-02 LAB — IRON,TIBC AND FERRITIN PANEL
Ferritin: 37 ng/mL (ref 15–150)
Iron Saturation: 5 % — CL (ref 15–55)
Iron: 18 ug/dL — ABNORMAL LOW (ref 27–139)
Total Iron Binding Capacity: 382 ug/dL (ref 250–450)
UIBC: 364 ug/dL (ref 118–369)

## 2020-12-02 LAB — VITAMIN D 25 HYDROXY (VIT D DEFICIENCY, FRACTURES): Vit D, 25-Hydroxy: 33.9 ng/mL (ref 30.0–100.0)

## 2020-12-02 LAB — T4, FREE: Free T4: 0.66 ng/dL — ABNORMAL LOW (ref 0.82–1.77)

## 2020-12-02 MED ORDER — LEVOTHYROXINE SODIUM 25 MCG PO TABS: 25.0000 ug | ORAL_TABLET | Freq: Every day | ORAL | 3 refills | Status: DC

## 2020-12-04 ENCOUNTER — Other Ambulatory Visit: Payer: Self-pay | Admitting: Nurse Practitioner

## 2020-12-04 ENCOUNTER — Telehealth: Payer: Self-pay | Admitting: Nurse Practitioner

## 2020-12-04 MED ORDER — IRON-VITAMIN C 65-125 MG PO TABS: 1.0000 | ORAL_TABLET | Freq: Two times a day (BID) | ORAL | 4 refills | Status: DC

## 2020-12-04 NOTE — Telephone Encounter (Signed)
Spoke to patient via telephone, discussed labs.  She continues to show anemia being present on labs with low iron level and lower side H/H, although mild trend up from her hospital discharge.  She is taking multivitamin, but no iron.  Will send in Vitron C as supplement to take twice daily.  Plan on repeat labs at next visit.  Her thyroid labs show elevation in TSH and low side T4, she is on Amiodarone and has history of hyperthyroid with previous treatment, on review notes it was suspected she would need Levo in future.  At this time will start Levothyroxine at low dose 25 MCG and work way up as needed, discussed with patient and provided education on this.  If ongoing elevation with treatment consider return to endo and if ongoing anemia consider hematology referral.  She is scheduled to return in 6 weeks for visit and labs.  Suspect in future will also need statin added on for cholesterol levels, discussed + has ongoing mild kidney disease we will monitor.

## 2020-12-07 ENCOUNTER — Other Ambulatory Visit: Payer: Self-pay | Admitting: Gastroenterology

## 2020-12-07 MED ORDER — PANTOPRAZOLE SODIUM 40 MG PO TBEC
40.0000 mg | DELAYED_RELEASE_TABLET | Freq: Every day | ORAL | 0 refills | Status: DC
Start: 2020-12-07 — End: 2021-03-23

## 2020-12-07 NOTE — Telephone Encounter (Signed)
Patient has appointment 02/20/21.  Patient needs  Pantoprazole 40 mg  RX before then as she only has 3 tabs left.

## 2020-12-07 NOTE — Telephone Encounter (Signed)
Patient was seen in the hospital on 10/17/20 and you preformed a EGD on her on 10/29/21. You prescribed pantoprazole can this be refilled

## 2020-12-12 ENCOUNTER — Ambulatory Visit: Payer: Medicare Other | Admitting: Gastroenterology

## 2020-12-18 ENCOUNTER — Telehealth: Payer: Self-pay | Admitting: Pharmacist

## 2020-12-19 NOTE — Progress Notes (Addendum)
    Chronic Care Management Pharmacy Assistant   Name: Courtney Anderson  MRN: [id number] DOB: 14-Dec-1955  Reason for Encounter: General Follow Up Call    PCP : Charlynne Cousins, MD  Allergies:   Allergies  Allergen Reactions   Codeine     Medications: Outpatient Encounter Medications as of 12/18/2020  Medication Sig   amiodarone (PACERONE) 200 MG tablet Take 1 tablet (200 mg total) by mouth daily.   Cholecalciferol (VITAMIN D) 50 MCG (2000 UT) tablet Take 2,000 Units by mouth daily.   Iron-Vitamin C 65-125 MG TABS Take 1 tablet by mouth in the morning and at bedtime. Will recheck iron level next visit.   levothyroxine (SYNTHROID) 25 MCG tablet Take 1 tablet (25 mcg total) by mouth daily before breakfast.   Multiple Vitamin (MULTIVITAMIN) tablet Take 1 tablet by mouth daily.   pantoprazole (PROTONIX) 40 MG tablet Take 1 tablet (40 mg total) by mouth daily.   No facility-administered encounter medications on file as of 12/18/2020.    Current Diagnosis: Patient Active Problem List   Diagnosis Date Noted   Primary hyperparathyroidism ([place]) 12/02/2020   Hypothyroidism 12/02/2020   Anemia 12/01/2020   History of 2019 novel coronavirus disease (COVID-19) 11/24/2020   Atrial fibrillation with RVR ([place]) 07/24/2020   Vitamin D deficiency 10/17/2019   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   History of hyperthyroidism 08/20/2018   Multinodular goiter 08/20/2018   Hypertension 05/31/2015   Morbid obesity ([place]) 05/31/2015   Elevated transaminase level 05/31/2015   Hyperlipidemia 05/31/2015    12-18-2020: The patient was cooperative and pleasant throughout our conversation. She reported she is doing and feeling better. She is not using her portable oxygen and continuously improves due to her home health physical therapy. She explained she was discharged by her nurse "last week" sometime. She reported her PCP added a new medication in January 2022. Per chart review, Synthroid 25 mcg was  started on 12-02-2020. Per the patient, she picked up the new medications and has been taking them daily as directed. She reported no side effects. Provided the patient with some education related to Synthroid such as possible side effects, the importance of taking it as directed, and why the medication was ordered. She verbalized understanding of all information given.   She continued to explain she does not check her blood pressure daily due to "physical therapy checks my blood pressure when they come." The patient was unable to give me a recent reading but stated "all of my blood pressures have been good.  She did not report any urgent needs related to any of her medications.   Reminded the patient about her follow-up appointment with her PCP on 01-12-2021 at 9:00 am. The patient verbalized understanding of the information given.   [name], LPN Clinical Pharmacist Assistant  [phone]      Follow-Up:  Pharmacist Review  I have reviewed the care management and care coordination activities outlined in this encounter and I am certifying that I agree with the content of this note.  [name]. Kenton Kingfisher PharmD, [place] [facility] [phone]

## 2021-01-12 ENCOUNTER — Ambulatory Visit (INDEPENDENT_AMBULATORY_CARE_PROVIDER_SITE_OTHER): Payer: Medicare Other | Admitting: Internal Medicine

## 2021-01-12 ENCOUNTER — Other Ambulatory Visit: Payer: Self-pay

## 2021-01-12 ENCOUNTER — Encounter: Payer: Self-pay | Admitting: Internal Medicine

## 2021-01-12 VITALS — BP 167/90 | HR 62 | Temp 98.2°F | Wt 222.0 lb

## 2021-01-12 DIAGNOSIS — E039 Hypothyroidism, unspecified: Secondary | ICD-10-CM | POA: Diagnosis not present

## 2021-01-12 DIAGNOSIS — I10 Essential (primary) hypertension: Secondary | ICD-10-CM | POA: Diagnosis not present

## 2021-01-12 LAB — MICROSCOPIC EXAMINATION

## 2021-01-12 LAB — URINALYSIS, ROUTINE W REFLEX MICROSCOPIC
Bilirubin, UA: NEGATIVE
Glucose, UA: NEGATIVE
Ketones, UA: NEGATIVE
Leukocytes,UA: NEGATIVE
Nitrite, UA: NEGATIVE
RBC, UA: NEGATIVE
Specific Gravity, UA: 1.025 (ref 1.005–1.030)
Urobilinogen, Ur: 0.2 mg/dL (ref 0.2–1.0)
pH, UA: 6.5 (ref 5.0–7.5)

## 2021-01-12 MED ORDER — METOPROLOL SUCCINATE ER 25 MG PO TB24: 25.0000 mg | ORAL_TABLET | Freq: Every day | ORAL | 1 refills | Status: DC

## 2021-01-12 NOTE — Progress Notes (Signed)
BP (!) 167/90   Pulse 62   Temp 98.2 F (36.8 C) (Oral)   Wt 222 lb (100.7 kg)   SpO2 97%   BMI 36.94 kg/m    Subjective:    Patient ID: Courtney Anderson, female    DOB: 08-17-1956, 65 y.o.   MRN: [id number]  HPI: MALESHA SULIMAN is a 65 y.o. female  Is a 65 year old African-American female with a past medical history of A. fib, hypertension, hyper thyroidism He has nonvalvular atrial fibrillation which was paroxysmal and symptomatic with recent Covid in September.  Patient followed up with cardiology and per their notes she had a episode of A. fib with A. fib when she had Covid was not on anticoagulation because of recent GI bleed that prompted cardiology to stop antiplatelet and anticoagulants and risk versus benefit of such were discussed with patient. Blood pressure continues to be up patient was on medications for such however has been DC'd.  Patient says she is in physical therapy and has been working up to a walker.  She was in a wheelchair and was bound to this for a while before she had physical therapy and is progressing very well. She feels her blood pressure is up because she had to walk to the appointment.   Hypertension This is a chronic (taken off of covid in sept was admitted 2 months then ) problem. The current episode started more than 1 year ago. The problem has been gradually worsening since onset. The problem is uncontrolled. Associated symptoms include headaches. Pertinent negatives include no anxiety, blurred vision, chest pain, malaise/fatigue, neck pain, orthopnea, palpitations, peripheral edema, PND or shortness of breath. (Slight headaches occasionally )  Hyperlipidemia This is a chronic problem. The problem is controlled. Pertinent negatives include no chest pain or shortness of breath.  Atrial Fibrillation Presents for follow-up (sees [facility] - is on amiodarone for such ) visit. Symptoms include hypertension. Symptoms are negative for  chest pain, palpitations and shortness of breath. (Ho bleeding on anticoagulants) Past medical history includes atrial fibrillation and hyperlipidemia.  Anemia Presents for follow-up visit. There has been no malaise/fatigue or palpitations.    Chief Complaint  Patient presents with  . Hypertension  . Hyperlipidemia  . Atrial Fibrillation  . Hypothyroidism  . Anemia    Relevant past medical, surgical, family and social history reviewed and updated as indicated. Interim medical history since our last visit reviewed. Allergies and medications reviewed and updated.  Review of Systems  Constitutional: Negative for malaise/fatigue.  Eyes: Negative for blurred vision.  Respiratory: Negative for shortness of breath.   Cardiovascular: Negative for chest pain, palpitations, orthopnea and PND.  Musculoskeletal: Negative for neck pain.  Neurological: Positive for headaches.    Per HPI unless specifically indicated above     Objective:    BP (!) 167/90   Pulse 62   Temp 98.2 F (36.8 C) (Oral)   Wt 222 lb (100.7 kg)   SpO2 97%   BMI 36.94 kg/m   Wt Readings from Last 3 Encounters:  01/12/21 222 lb (100.7 kg)  10/30/20 205 lb 0.4 oz (93 kg)  08/28/20 218 lb 3.2 oz (99 kg)    Physical Exam Vitals and nursing note reviewed.  Constitutional:      General: She is not in acute distress.    Appearance: Normal appearance. She is not ill-appearing or diaphoretic.  HENT:     Head: Normocephalic and atraumatic.     Right Ear:  Tympanic membrane and external ear normal. There is no impacted cerumen.     Left Ear: External ear normal.     Nose: No congestion or rhinorrhea.     Mouth/Throat:     Pharynx: No oropharyngeal exudate or posterior oropharyngeal erythema.  Eyes:     Conjunctiva/sclera: Conjunctivae normal.     Pupils: Pupils are equal, round, and reactive to light.  Cardiovascular:     Rate and Rhythm: Normal rate and regular rhythm.     Heart sounds: No murmur heard. No  friction rub. No gallop.   Pulmonary:     Effort: No respiratory distress.     Breath sounds: No stridor. No wheezing or rhonchi.  Chest:     Chest wall: No tenderness.  Abdominal:     General: Abdomen is flat. Bowel sounds are normal. There is no distension.     Palpations: Abdomen is soft. There is no mass.     Tenderness: There is no abdominal tenderness. There is no guarding.  Musculoskeletal:        General: No swelling or deformity.     Cervical back: Normal range of motion and neck supple. No rigidity or tenderness.     Right lower leg: No edema.     Left lower leg: No edema.  Skin:    General: Skin is warm and dry.     Coloration: Skin is not jaundiced.     Findings: No erythema.  Neurological:     Mental Status: She is alert. Mental status is at baseline.  Psychiatric:        Mood and Affect: Mood normal.        Thought Content: Thought content normal.        Judgment: Judgment normal.     Results for orders placed or performed in visit on 12/01/20  CBC with Differential/Platelet  Result Value Ref Range   WBC 7.7 3.4 - 10.8 x10E3/uL   RBC 3.79 3.77 - 5.28 x10E6/uL   Hemoglobin 8.6 (L) 11.1 - 15.9 g/dL   Hematocrit 28.7 (L) 34.0 - 46.6 %   MCV 76 (L) 79 - 97 fL   MCH 22.7 (L) 26.6 - 33.0 pg   MCHC 30.0 (L) 31.5 - 35.7 g/dL   RDW 18.9 (H) 11.7 - 15.4 %   Platelets 436 150 - 450 x10E3/uL   Neutrophils 61 Not Estab. %   Lymphs 27 Not Estab. %   Monocytes 7 Not Estab. %   Eos 4 Not Estab. %   Basos 1 Not Estab. %   Neutrophils Absolute 4.7 1.4 - 7.0 x10E3/uL   Lymphocytes Absolute 2.1 0.7 - 3.1 x10E3/uL   Monocytes Absolute 0.5 0.1 - 0.9 x10E3/uL   EOS (ABSOLUTE) 0.3 0.0 - 0.4 x10E3/uL   Basophils Absolute 0.1 0.0 - 0.2 x10E3/uL   Immature Granulocytes 0 Not Estab. %   Immature Grans (Abs) 0.0 0.0 - 0.1 x10E3/uL  Comprehensive metabolic panel  Result Value Ref Range   Glucose 85 65 - 99 mg/dL   BUN 12 8 - 27 mg/dL   Creatinine, Ser 1.12 (H) 0.57 - 1.00  mg/dL   GFR calc non Af Amer 52 (L) >59 mL/min/1.73   GFR calc Af Amer 60 >59 mL/min/1.73   BUN/Creatinine Ratio 11 (L) 12 - 28   Sodium 139 134 - 144 mmol/L   Potassium 4.0 3.5 - 5.2 mmol/L   Chloride 102 96 - 106 mmol/L   CO2 24 20 - 29 mmol/L  Calcium 10.4 (H) 8.7 - 10.3 mg/dL   Total Protein 7.4 6.0 - 8.5 g/dL   Albumin 3.8 3.8 - 4.8 g/dL   Globulin, Total 3.6 1.5 - 4.5 g/dL   Albumin/Globulin Ratio 1.1 (L) 1.2 - 2.2   Bilirubin Total 0.3 0.0 - 1.2 mg/dL   Alkaline Phosphatase 74 44 - 121 IU/L   AST 21 0 - 40 IU/L   ALT 8 0 - 32 IU/L  Lipid Panel w/o Chol/HDL Ratio  Result Value Ref Range   Cholesterol, Total 202 (H) 100 - 199 mg/dL   Triglycerides 71 0 - 149 mg/dL   HDL 52 >39 mg/dL   VLDL Cholesterol Cal 13 5 - 40 mg/dL   LDL Chol Calc (NIH) 137 (H) 0 - 99 mg/dL  TSH  Result Value Ref Range   TSH 71.200 (H) 0.450 - 4.500 uIU/mL  Vitamin B12  Result Value Ref Range   Vitamin B-12 530 232 - 1,245 pg/mL  Iron, TIBC and Ferritin Panel  Result Value Ref Range   Total Iron Binding Capacity 382 250 - 450 ug/dL   UIBC 364 118 - 369 ug/dL   Iron 18 (L) 27 - 139 ug/dL   Iron Saturation 5 (LL) 15 - 55 %   Ferritin 37 15 - 150 ng/mL  T4, free  Result Value Ref Range   Free T4 0.66 (L) 0.82 - 1.77 ng/dL  VITAMIN D 25 Hydroxy (Vit-D Deficiency, Fractures)  Result Value Ref Range   Vit D, 25-Hydroxy 33.9 30.0 - 100.0 ng/mL      Assessment & Plan:  1. Afib " Ron will need to follow-up with cardiology ASAP patient has not been back to see them since her hospital discharge however will see them next Friday. No anticoagulation at this point will need to discuss this with cardiology given that she recently had A. fib and discuss risk/ benefits of treatment according to their advice. Pain management per cardiology  2. Hypothyroidism? HyperSec to check labs Recheck TSH was very high at last visit probably secondary to this acutely.   3. HTN : will restart metoprolol bp high  today with Diastolic in the 683'F   Will recheck in 3 weeks.  Medication compliance emphasised. pt advised to keep Bp logs. Pt verbalised understanding of the same. Pt to have a low salt diet . Exercise to reach a goal of at least 150 mins a week.  lifestyle modifications explained and pt understands importance of the above.  4. hypercalcemia will need to follow-up with endocrinology ASAP has not seen them in a while either  Problem List Items Addressed This Visit   None      Follow up plan: No follow-ups on file.  Health Maintenance : Mammogram/10/2008 paps smear 2015 DEXA: next visit Cscope : 10/2020 Pneumonia vaccine next visit.

## 2021-01-13 LAB — CBC WITH DIFFERENTIAL/PLATELET
Basophils Absolute: 0.1 10*3/uL (ref 0.0–0.2)
Basos: 2 %
EOS (ABSOLUTE): 0.1 10*3/uL (ref 0.0–0.4)
Eos: 2 %
Hematocrit: 36.8 % (ref 34.0–46.6)
Hemoglobin: 11 g/dL — ABNORMAL LOW (ref 11.1–15.9)
Immature Grans (Abs): 0 10*3/uL (ref 0.0–0.1)
Immature Granulocytes: 0 %
Lymphocytes Absolute: 2.1 10*3/uL (ref 0.7–3.1)
Lymphs: 33 %
MCH: 22.7 pg — ABNORMAL LOW (ref 26.6–33.0)
MCHC: 29.9 g/dL — ABNORMAL LOW (ref 31.5–35.7)
MCV: 76 fL — ABNORMAL LOW (ref 79–97)
Monocytes Absolute: 0.5 10*3/uL (ref 0.1–0.9)
Monocytes: 7 %
Neutrophils Absolute: 3.4 10*3/uL (ref 1.4–7.0)
Neutrophils: 56 %
Platelets: 358 10*3/uL (ref 150–450)
RBC: 4.85 x10E6/uL (ref 3.77–5.28)
RDW: 21.4 % — ABNORMAL HIGH (ref 11.7–15.4)
WBC: 6.1 10*3/uL (ref 3.4–10.8)

## 2021-01-13 LAB — COMPREHENSIVE METABOLIC PANEL
ALT: 14 IU/L (ref 0–32)
AST: 23 IU/L (ref 0–40)
Albumin/Globulin Ratio: 1 — ABNORMAL LOW (ref 1.2–2.2)
Albumin: 4.1 g/dL (ref 3.8–4.8)
Alkaline Phosphatase: 96 IU/L (ref 44–121)
BUN/Creatinine Ratio: 13 (ref 12–28)
BUN: 13 mg/dL (ref 8–27)
Bilirubin Total: 0.3 mg/dL (ref 0.0–1.2)
CO2: 22 mmol/L (ref 20–29)
Calcium: 10 mg/dL (ref 8.7–10.3)
Chloride: 102 mmol/L (ref 96–106)
Creatinine, Ser: 1.02 mg/dL — ABNORMAL HIGH (ref 0.57–1.00)
Globulin, Total: 4 g/dL (ref 1.5–4.5)
Glucose: 88 mg/dL (ref 65–99)
Potassium: 3.4 mmol/L — ABNORMAL LOW (ref 3.5–5.2)
Sodium: 141 mmol/L (ref 134–144)
Total Protein: 8.1 g/dL (ref 6.0–8.5)
eGFR: 61 mL/min/{1.73_m2} (ref >59)

## 2021-01-13 LAB — TSH: TSH: 49.3 u[IU]/mL — ABNORMAL HIGH (ref 0.450–4.500)

## 2021-01-15 ENCOUNTER — Ambulatory Visit (INDEPENDENT_AMBULATORY_CARE_PROVIDER_SITE_OTHER): Payer: Medicare Other | Admitting: Pharmacist

## 2021-01-15 DIAGNOSIS — I4891 Unspecified atrial fibrillation: Secondary | ICD-10-CM | POA: Diagnosis not present

## 2021-01-15 DIAGNOSIS — I10 Essential (primary) hypertension: Secondary | ICD-10-CM

## 2021-01-15 DIAGNOSIS — E039 Hypothyroidism, unspecified: Secondary | ICD-10-CM

## 2021-01-15 NOTE — Progress Notes (Signed)
Chronic Care Management Pharmacy Note  01/22/2021 Name:  Courtney Anderson MRN:  [id number] DOB:  05/23/1956  Subjective: Courtney Anderson is an 65 y.o. year old female who is a primary patient of Vigg, Avanti, MD.  The CCM team was consulted for assistance with disease management and care coordination needs.    Engaged with patient by telephone for initial visit in response to provider referral for pharmacy case management and/or care coordination services.   Consent to Services:  The patient was given the following information about Chronic Care Management services today, agreed to services, and gave verbal consent: 1. CCM service includes personalized support from designated clinical staff supervised by the primary care provider, including individualized plan of care and coordination with other care providers 2. 24/7 contact phone numbers for assistance for urgent and routine care needs. 3. Service will only be billed when office clinical staff spend 20 minutes or more in a month to coordinate care. 4. Only one practitioner may furnish and bill the service in a calendar month. 5.The patient may stop CCM services at any time (effective at the end of the month) by phone call to the office staff. 6. The patient will be responsible for cost sharing (co-pay) of up to 20% of the service fee (after annual deductible is met). Patient agreed to services and consent obtained.  Patient Care Team: Charlynne Cousins, MD as PCP - General Greg Cutter, LCSW as Social Worker (Licensed Clinical Social Worker) Vladimir Faster, [facility] (Pharmacist)  Recent office visits: 01/12/21- Vigg(pcp)- blood work- restart metoprolol  Recent consult visits: 11/06/20- Kowalski(cards)- hold asa/ anticoag f/u 3/18  Hospital visits: 10/27/20-10/30/20- ED , admitted to [facility] with GIB, hemorrhagic shock with Hb 2.7, FOBT + stool- D.c Xarelto   Objective:  Lab Results  Component Value Date   CREATININE 1.02 (H) 01/12/2021   BUN  13 01/12/2021   GFRNONAA 52 (L) 12/01/2020   GFRAA 60 12/01/2020   NA 141 01/12/2021   K 3.4 (L) 01/12/2021   CALCIUM 10.0 01/12/2021   CO2 22 01/12/2021    Lab Results  Component Value Date/Time   HGBA1C 7.1 (H) 08/03/2020 04:30 AM   HGBA1C 6.0 06/02/2015 11:07 AM   MICROALBUR 10 06/02/2015 11:07 AM    Last diabetic Eye exam: No results found for: HMDIABEYEEXA  Last diabetic Foot exam: No results found for: HMDIABFOOTEX   Lab Results  Component Value Date   CHOL 202 (H) 12/01/2020   HDL 52 12/01/2020   LDLCALC 137 (H) 12/01/2020   TRIG 71 12/01/2020   CHOLHDL 3.9 05/13/2018    Hepatic Function Latest Ref Rng & Units 01/12/2021 12/01/2020 10/27/2020  Total Protein 6.0 - 8.5 g/dL 8.1 7.4 6.0(L)  Albumin 3.8 - 4.8 g/dL 4.1 3.8 2.9(L)  AST 0 - 40 IU/L _0 ALT 0 - 32 IU/L _1 Alk Phosphatase 44 - 121 IU/L 96 74 42  Total Bilirubin 0.0 - 1.2 mg/dL 0.3 0.3 0.6  Bilirubin, Direct 0.0 - 0.2 mg/dL - - -    Lab Results  Component Value Date/Time   TSH 49.300 (H) 01/12/2021 09:33 AM   TSH 71.200 (H) 12/01/2020 11:40 AM   FREET4 0.66 (L) 12/01/2020 11:40 AM   FREET4 0.94 07/28/2020 05:01 AM    CBC Latest Ref Rng & Units 01/12/2021 12/01/2020 10/30/2020  WBC 3.4 - 10.8 x10E3/uL 6.1 7.7 9.2  Hemoglobin 11.1 - 15.9 g/dL 11.0(L) 8.6(L) 8.3(L)  Hematocrit 34.0 - 46.6 %  36.8 28.7(L) 25.5(L)  Platelets 150 - 450 x10E3/uL 358 436 297    Lab Results  Component Value Date/Time   VD25OH 33.9 12/01/2020 11:40 AM   VD25OH 26.4 (L) 10/22/2019 11:00 AM    Clinical ASCVD: No  The 10-year ASCVD risk score Mikey Bussing DC Jr., et al., 2013) is: 20.2%   Values used to calculate the score:     Age: 34 years     Sex: Female     Is Non-Hispanic African American: Yes     Diabetic: No     Tobacco smoker: No     Systolic Blood Pressure: 308 mmHg     Is BP treated: Yes     HDL Cholesterol: 52 mg/dL     Total Cholesterol: 202 mg/dL    Depression screen [facility] 2/9 01/12/2021 12/01/2020  10/25/2020  Decreased Interest 0 0 0  Down, Depressed, Hopeless 0 0 0  PHQ - 2 Score 0 0 0     CHA2DS2/VAS Stroke Risk Points  Current as of yesterday     3 >= 2 Points: High Risk  1 - 1.99 Points: Medium Risk  0 Points: Low Risk    No Change      Details    This score determines the patient's risk of having a stroke if the  patient has atrial fibrillation.       Points Metrics  0 Has Congestive Heart Failure:  No    Current as of yesterday  0 Has Vascular Disease:  No    Current as of yesterday  1 Has Hypertension:  Yes    Current as of yesterday  1 Age:  68    Current as of yesterday  0 Has Diabetes:  No    Current as of yesterday  0 Had Stroke:  No  Had TIA:  No  Had Thromboembolism:  No    Current as of yesterday  1 Female:  Yes    Current as of yesterday            Social History   Tobacco Use  Smoking Status Former Smoker  Smokeless Tobacco Never Used   BP Readings from Last 3 Encounters:  01/12/21 (!) 167/90  12/01/20 136/80  10/30/20 135/62   Pulse Readings from Last 3 Encounters:  01/12/21 62  12/01/20 65  10/30/20 62   Wt Readings from Last 3 Encounters:  01/12/21 222 lb (100.7 kg)  10/30/20 205 lb 0.4 oz (93 kg)  08/28/20 218 lb 3.2 oz (99 kg)    Assessment/Interventions: Review of patient past medical history, allergies, medications, health status, including review of consultants reports, laboratory and other test data, was performed as part of comprehensive evaluation and provision of chronic care management services.   SDOH:  (Social Determinants of Health) assessments and interventions performed: Yes SDOH Interventions   Flowsheet Row Most Recent Value  SDOH Interventions   Financial Strain Interventions Other (Comment)  [LIS application completed, Community care guide referral placed]        Immunization History  Administered Date(s) Administered  . Influenza,inj,Quad PF,6+ Mos 12/19/2016, 08/29/2017, 10/22/2019  .  PFIZER(Purple Top)SARS-COV-2 Vaccination 12/15/2020, 01/05/2021  . Td 08/24/2007    Conditions to be addressed/monitored:  Hypertension, Hyperlipidemia, Atrial Fibrillation and Hypothyroidism (h/o GIB on Xarelto)  Care Plan : CCM Pharmacy Care Plan  Updates made by Vladimir Faster, RPH since 01/22/2021 12:00 AM    Problem: Afib, h/o GIB on anticoagulation, HTN, HLD,hypothyroidism   Priority: High  Long-Range Goal: disease Management   Start Date: 01/15/2021  This Visit's Progress: Not on track  Priority: High  Note:   Current Barriers:  . Unable to independently afford treatment regimen . Unable to independently monitor therapeutic efficacy . Unable to self administer medications as prescribed . Does not contact provider office for questions/concerns   Pharmacist Clinical Goal(s):  . patient will achieve adherence to monitoring guidelines and medication adherence to achieve therapeutic efficacy . achieve control of hypertension as evidenced by BP readings . achieve ability to self administer medications as prescribed through use of pill box as evidenced by patient report . contact provider office for questions/concerns as evidenced notation of same in electronic health record through collaboration with PharmD and provider.    Interventions: . 1:1 collaboration with Charlynne Cousins, MD regarding development and update of comprehensive plan of care as evidenced by provider attestation and co-signature . Inter-disciplinary care team collaboration (see longitudinal plan of care) . Comprehensive medication review performed; medication list updated in electronic medical record .  BP Readings from Last 3 Encounters:  01/12/21 (!) 167/90  12/01/20 136/80  10/30/20 135/62    Hypertension (BP goal <130/80) -Uncontrolled -Current treatment: . Metoprolol succinate 25 mg qd -Medications previously tried: NA -Current home readings: unknown -Current dietary habits: reducing  sodium -Current exercise habits: walking -Denies hypotensive/hypertensive symptoms -Educated on BP goals and benefits of medications for prevention of heart attack, stroke and kidney damage; Daily salt intake goal < 2300 mg; Exercise goal of 150 minutes per week; Importance of home blood pressure monitoring; -Counseled to monitor BP at home bid, document, and provide log at future appointments -Counseled on diet and exercise extensively Recommended patient monitor BP even if she doesn't feely symptomatict Educated on restart Metoprolol per Dr. Levada Dy notes on 3/11 v  The 10-year ASCVD risk score Mikey Bussing DC Jr., et al., 2013) is: 20.2% Lab Results  Component Value Date   LDLCALC 137 (H) 12/01/2020    Hyperlipidemia: (LDL goal < 100) -Uncontrolled -Current treatment: . NONE -Medications previously tried: NA  - -Educated on Cholesterol goals;  Benefits of statin for ASCVD risk reduction; Importance of limiting foods high in cholesterol; Exercise goal of 150 minutes per week; -Recommended moderate intensity rosuvastatin given high ASCVD risk   Atrial Fibrillation (Goal: prevent stroke and major bleeding) -Not ideally controlled -CHADSVASC: 3  -Current treatment:   Rhythm control: amiodarone 224m qd . Rate control: Toprol xl 25 mg qd (patient will restart today) . Anticoagulation: on hold d/t GIB wit hemorrhagic shock in December -Medications previously tried: NA -Home BP and HR readings: unknown  -Counseled on importance of regular laboratory monitoring; -Counseled on diet and exercise extensively Counseled on restarting metoprolol per PCP note Assessed patient finances. LIS application completed during visit. Community care guide referral placed for assistance with utility and food  Patient to folllow up with cardiology on 3/18   Hypothyroidism (Goal: minimze symptoms, avoid complications) -Uncontrolled -Current treatment  . Levothyroxine 25 mcg qd -Medications  previously tried: NA  -Recommended to continue current medication Recommended patient follow up with endocrinology Educated on signs and symptoms or thyroid disorder including fatigue, weakness and cold intolerance. Amiodarone may diminish the effectiveness of .levothyroxine and patient should have regular monitoring and contact prescriber if symptoms devwelop.   Patient Goals/Self-Care Activities . patient will:  - take medications as prescribed check blood pressure twice daily, document, and provide at future appointments collaborate with provider on medication access solutions target a minimum of 150 minutes  of moderate intensity exercise weekly engage in dietary modifications by reducing sodium, heart healthy diet  Follow Up Plan: Telephone follow up appointment with care management team member scheduled for: 2 months PharmD, 2-4 weeks CPA         Medication Assistance: Completed Medicare LIS application during appointment  Patient's preferred pharmacy is:  [facility] [address]), [place]) [place] [address] Phone: [phone] Fax: [phone]  Uses pill box? No - uses vials Pt endorses 95% compliance  We discussed: Benefits of medication synchronization, packaging and delivery as well as enhanced pharmacist oversight with [employer]. Patient decided to: Continue current medication management strategy  Care Plan and Follow Up Patient Decision:  Patient agrees to Care Plan and Follow-up.  Plan: Telephone follow up appointment with care management team member scheduled for:  CPA- one month, PharmD 2 months  [name]. Kenton Kingfisher PharmD, [facility] [phone]

## 2021-01-16 ENCOUNTER — Telehealth: Payer: Self-pay | Admitting: Internal Medicine

## 2021-01-16 ENCOUNTER — Other Ambulatory Visit: Payer: Self-pay | Admitting: Internal Medicine

## 2021-01-16 NOTE — Telephone Encounter (Signed)
Pt stated she feels good and declined apt offered for her to come in pt declined multiple apts Pt stated she did not want to come in per provider this apt can not be virtual apt she would have to come in, Per provider pt was advised to go to er if she feels worse or bp continues to be high Pt was advised to keep log of BP pressure and if continues to be high go to ER or urgent care and call our office to schedule apt. PT verbalized understanding.

## 2021-01-16 NOTE — Telephone Encounter (Signed)
Called to inform the doctor that the patient is seen today and her BP is high at 190/88, HR 40-60 fluctuating.  She also stated that the patient was started back on her BP medication yesterday.  If there are any other questions, please call [phone]

## 2021-01-17 ENCOUNTER — Telehealth: Payer: Self-pay | Admitting: Internal Medicine

## 2021-01-17 ENCOUNTER — Other Ambulatory Visit: Payer: Self-pay | Admitting: Internal Medicine

## 2021-01-17 MED ORDER — LISINOPRIL 20 MG PO TABS: 20.0000 mg | ORAL_TABLET | Freq: Every day | ORAL | 3 refills | Status: DC

## 2021-01-17 NOTE — Progress Notes (Signed)
Patient on lisinopril 20 mg daily patient has not been seen by cardiology for a while now per my last note she was here a week ago for a follow-up on her blood pressure and her chronic medical problems. Patient will have to be seen by cardiology ASAP reiterated this at patient's last visit this needs to happen this week and patient will need to keep blood pressure logs and follow-up with me as scheduled

## 2021-01-17 NOTE — Telephone Encounter (Signed)
Called patient to advise that Lisinopril 10mg  was added to her medications and to make appt with cardiology asap. Patient stated that she has an appt on Friday with Dr. [name] (Cardiology) on Friday and that until she gets an explanation she will not be taking Lisinopril and will be asking Dr. [name] about why she should take new medication.

## 2021-01-17 NOTE — Telephone Encounter (Signed)
Chris, from [facility], called stating that the pts BP was 179/89 with a resting heart rate of 42. He states that the pt is brand new to metoprolol which he believes may be causing the issues. Please advise.     [phone]

## 2021-01-19 DIAGNOSIS — R001 Bradycardia, unspecified: Secondary | ICD-10-CM | POA: Diagnosis not present

## 2021-01-19 DIAGNOSIS — I4891 Unspecified atrial fibrillation: Secondary | ICD-10-CM | POA: Diagnosis not present

## 2021-01-19 DIAGNOSIS — I10 Essential (primary) hypertension: Secondary | ICD-10-CM | POA: Diagnosis not present

## 2021-01-19 DIAGNOSIS — R7989 Other specified abnormal findings of blood chemistry: Secondary | ICD-10-CM | POA: Diagnosis not present

## 2021-01-19 DIAGNOSIS — E782 Mixed hyperlipidemia: Secondary | ICD-10-CM | POA: Diagnosis not present

## 2021-01-19 DIAGNOSIS — R578 Other shock: Secondary | ICD-10-CM | POA: Diagnosis not present

## 2021-01-22 NOTE — Patient Instructions (Addendum)
Visit Information  It was a pleasure speaking with you today! Thank you for letting me be a part of your care team. Please call with any questions or concerns.  Goals Addressed            This Visit's Progress   . Track and Manage Heart Rate and Rhythm-Atrial Fibrillation       Timeframe:  Short-Term Goal Priority:  High Start Date:                             Expected End Date:                       Follow Up Date 2 month follow up   - check pulse (heart) rate once a day - cut down alcohol use - make a plan to exercise regularly - make a plan to eat healthy - keep all lab appointments - take medicine as prescribed - wear medical alert identification    Why is this important?    Atrial fibrillation may have no symptoms. Sometimes the symptoms get worse or happen more often.   It is important to keep track of what your symptoms are and when they happen.   A change in symptoms is important to discuss with your doctor or nurse.   Being active and healthy eating will also help you manage your heart condition.     Notes:     . Track and Manage My Blood Pressure-Hypertension       Timeframe:  Short-Term Goal Priority:  High Start Date:                             Expected End Date:                       Follow Up Date one month CPA, 2 month PharmD    - check blood pressure daily - write blood pressure results in a log or diary    Why is this important?    You won't feel high blood pressure, but it can still hurt your blood vessels.   High blood pressure can cause heart or kidney problems. It can also cause a stroke.   Making lifestyle changes like losing a little weight or eating less salt will help.   Checking your blood pressure at home and at different times of the day can help to control blood pressure.   If the doctor prescribes medicine remember to take it the way the doctor ordered.   Call the office if you cannot afford the medicine or if there are  questions about it.     Notes:        Courtney Anderson was given information about Chronic Care Management services today including:  1. CCM service includes personalized support from designated clinical staff supervised by her physician, including individualized plan of care and coordination with other care providers 2. 24/7 contact phone numbers for assistance for urgent and routine care needs. 3. Standard insurance, coinsurance, copays and deductibles apply for chronic care management only during months in which we provide at least 20 minutes of these services. Most insurances cover these services at 100%, however patients may be responsible for any copay, coinsurance and/or deductible if applicable. This service may help you avoid the need for more expensive face-to-face services. 4. Only one practitioner may  furnish and bill the service in a calendar month. 5. The patient may stop CCM services at any time (effective at the end of the month) by phone call to the office staff.  Patient agreed to services and verbal consent obtained.   The patient verbalized understanding of instructions, educational materials, and care plan provided today and agreed to receive a mailed copy of patient instructions, educational materials, and care plan.  Telephone follow up appointment with pharmacy team member scheduled for: 2- 4 weeks  [name]. [place] PharmD, BCPS Clinical Pharmacist [phone]  How to Take Your Blood Pressure Blood pressure measures how strongly your blood is pressing against the walls of your arteries. Arteries are blood vessels that carry blood from your heart throughout your body. You can take your blood pressure at home with a machine. You may need to check your blood pressure at home:  To check if you have high blood pressure (hypertension).  To check your blood pressure over time.  To make sure your blood pressure medicine is working. Supplies needed:  Blood pressure machine,  or monitor.  Dining room chair to sit in.  Table or desk.  Small notebook.  Pencil or pen. How to prepare Avoid these things for 30 minutes before checking your blood pressure:  Having drinks with caffeine in them, such as coffee or tea.  Drinking alcohol.  Eating.  Smoking.  Exercising. Do these things five minutes before checking your blood pressure:  Go to the bathroom and pee (urinate).  Sit in a dining chair. Do not sit in a soft couch or an armchair.  Be quiet. Do not talk. How to take your blood pressure Follow the instructions that came with your machine. If you have a digital blood pressure monitor, these may be the instructions: 1. Sit up straight. 2. Place your feet on the floor. Do not cross your ankles or legs. 3. Rest your left arm at the level of your heart. You may rest it on a table, desk, or chair. 4. Pull up your shirt sleeve. 5. Wrap the blood pressure cuff around the upper part of your left arm. The cuff should be 1 inch (2.5 cm) above your elbow. It is best to wrap the cuff around bare skin. 6. Fit the cuff snugly around your arm. You should be able to place only one finger between the cuff and your arm. 7. Place the cord so that it rests in the bend of your elbow. 8. Press the power button. 9. Sit quietly while the cuff fills with air and loses air. 10. Write down the numbers on the screen. 11. Wait 2-3 minutes and then repeat steps 1-10.   What do the numbers mean? Two numbers make up your blood pressure. The first number is called systolic pressure. The second is called diastolic pressure. An example of a blood pressure reading is "120 over 80" (or 120/80). If you are an adult and do not have a medical condition, use this guide to find out if your blood pressure is normal: Normal  First number: below 120.  Second number: below 80. Elevated  First number: 120-129.  Second number: below 80. Hypertension stage 1  First number:  130-139.  Second number: 80-89. Hypertension stage 2  First number: 140 or above.  Second number: 53 or above. Your blood pressure is above normal even if only the top or bottom number is above normal. Follow these instructions at home:  Check your blood pressure as often  as your doctor tells you to.  Check your blood pressure at the same time every day.  Take your monitor to your next doctor's appointment. Your doctor will: ? Make sure you are using it correctly. ? Make sure it is working right.  Make sure you understand what your blood pressure numbers should be.  Tell your doctor if your medicine is causing side effects.  Keep all follow-up visits as told by your doctor. This is important. General tips:  You will need a blood pressure machine, or monitor. Your doctor can suggest a monitor. You can buy one at a drugstore or online. When choosing one: ? Choose one with an arm cuff. ? Choose one that wraps around your upper arm. Only one finger should fit between your arm and the cuff. ? Do not choose one that measures your blood pressure from your wrist or finger. Where to find more information American Heart Association: www.heart.org Contact a doctor if:  Your blood pressure keeps being high. Get help right away if:  Your first blood pressure number is higher than 180.  Your second blood pressure number is higher than 120. Summary  Check your blood pressure at the same time every day.  Avoid caffeine, alcohol, smoking, and exercise for 30 minutes before checking your blood pressure.  Make sure you understand what your blood pressure numbers should be. This information is not intended to replace advice given to you by your health care provider. Make sure you discuss any questions you have with your health care provider. Document Revised: 10/15/2019 Document Reviewed: 10/15/2019 Elsevier Patient Education  2021 [employer].

## 2021-01-26 DIAGNOSIS — R531 Weakness: Secondary | ICD-10-CM | POA: Diagnosis not present

## 2021-01-28 DIAGNOSIS — R531 Weakness: Secondary | ICD-10-CM | POA: Diagnosis not present

## 2021-01-31 ENCOUNTER — Telehealth: Payer: Self-pay | Admitting: Pharmacist

## 2021-01-31 NOTE — Chronic Care Management (AMB) (Signed)
    Chronic Care Management Pharmacy Assistant   Name: Courtney Anderson  MRN: [id number] DOB: 1956/02/24  Reason for Encounter: Chart Review  Medications: Outpatient Encounter Medications as of 01/31/2021  Medication Sig  . amiodarone (PACERONE) 200 MG tablet Take 1 tablet (200 mg total) by mouth daily.  . Cholecalciferol (VITAMIN D) 50 MCG (2000 UT) tablet Take 2,000 Units by mouth daily.  . Iron-Vitamin C 65-125 MG TABS Take 1 tablet by mouth in the morning and at bedtime. Will recheck iron level next visit.  Marland Kitchen levothyroxine (SYNTHROID) 25 MCG tablet Take 1 tablet (25 mcg total) by mouth daily before breakfast.  . lisinopril (ZESTRIL) 20 MG tablet Take 1 tablet (20 mg total) by mouth daily.  . metoprolol succinate (TOPROL-XL) 25 MG 24 hr tablet Take 1 tablet (25 mg total) by mouth daily.  . Multiple Vitamin (MULTIVITAMIN) tablet Take 1 tablet by mouth daily.  . pantoprazole (PROTONIX) 40 MG tablet Take 1 tablet (40 mg total) by mouth daily.   No facility-administered encounter medications on file as of 01/31/2021.    Reviewed chart for medication changes since last care coordination call/Pharmacist visit.  01/19/2021 OV (cardiology) Hassell Done, NP; Recently began lisinopril 3 days ago. Today we will also initiate amlodipine for better blood pressure control and start her at 5 mg daily  Future Appointments  Date Time Provider [place]  02/02/2021  9:40 AM Charlynne Cousins, MD [facility]  02/20/2021  3:00 PM Lin Landsman, MD [facility] None  03/12/2021 11:00 AM CFP CCM PHARMACY [facility]    April D Calhoun, [place] Pharmacist Assistant [phone]

## 2021-02-02 ENCOUNTER — Ambulatory Visit (INDEPENDENT_AMBULATORY_CARE_PROVIDER_SITE_OTHER): Payer: Medicare Other | Admitting: Internal Medicine

## 2021-02-02 ENCOUNTER — Other Ambulatory Visit: Payer: Self-pay

## 2021-02-02 ENCOUNTER — Encounter: Payer: Self-pay | Admitting: Internal Medicine

## 2021-02-02 VITALS — BP 164/74 | HR 51 | Temp 98.7°F | Wt 223.4 lb

## 2021-02-02 DIAGNOSIS — E785 Hyperlipidemia, unspecified: Secondary | ICD-10-CM | POA: Diagnosis not present

## 2021-02-02 DIAGNOSIS — I10 Essential (primary) hypertension: Secondary | ICD-10-CM

## 2021-02-02 MED ORDER — LEVOTHYROXINE SODIUM 50 MCG PO TABS: 50.0000 ug | ORAL_TABLET | Freq: Every day | ORAL | 3 refills | Status: DC

## 2021-02-02 MED ORDER — LISINOPRIL 40 MG PO TABS: 40.0000 mg | ORAL_TABLET | Freq: Every day | ORAL | 6 refills | Status: DC

## 2021-02-02 NOTE — Progress Notes (Signed)
BP (!) 163/79   Pulse (!) 51   Temp 98.7 F (37.1 C) (Oral)   Wt 223 lb 6.4 oz (101.3 kg)   SpO2 98%   BMI 37.18 kg/m    Subjective:    Patient ID: Courtney Anderson, female    DOB: 24-Jan-1956, 65 y.o.   MRN: [id number]  HPI: Courtney Anderson is a 65 y.o. female  Hypertension This is a chronic (bp is hypertensive 163/74 mm hg. is on norvasc and lisinopril 20 mg. ) problem. The current episode started more than 1 year ago. The problem is uncontrolled. Pertinent negatives include no anxiety, blurred vision, chest pain, headaches, malaise/fatigue, neck pain, orthopnea, palpitations, peripheral edema, PND or shortness of breath.    Chief Complaint  Patient presents with  . Hypertension    Patient states she her blood pressure goes up and down from time to time for the last month, due to her being off her medication. Patient states she is now back on her medication, but her reading has still been elevated.     Relevant past medical, surgical, family and social history reviewed and updated as indicated. Interim medical history since our last visit reviewed. Allergies and medications reviewed and updated.  Review of Systems  Constitutional: Negative for malaise/fatigue.  Eyes: Negative for blurred vision.  Respiratory: Negative for shortness of breath.   Cardiovascular: Negative for chest pain, palpitations, orthopnea and PND.  Musculoskeletal: Negative for neck pain.  Neurological: Negative for headaches.    Per HPI unless specifically indicated above     Objective:    BP (!) 163/79   Pulse (!) 51   Temp 98.7 F (37.1 C) (Oral)   Wt 223 lb 6.4 oz (101.3 kg)   SpO2 98%   BMI 37.18 kg/m   Wt Readings from Last 3 Encounters:  02/02/21 223 lb 6.4 oz (101.3 kg)  01/12/21 222 lb (100.7 kg)  10/30/20 205 lb 0.4 oz (93 kg)    Physical Exam Vitals and nursing note reviewed.  Constitutional:      General: She is not in acute distress.    Appearance: Normal appearance. She is  not ill-appearing or diaphoretic.  HENT:     Head: Normocephalic and atraumatic.     Right Ear: Tympanic membrane and external ear normal. There is no impacted cerumen.     Left Ear: External ear normal.     Nose: No congestion or rhinorrhea.     Mouth/Throat:     Pharynx: No oropharyngeal exudate or posterior oropharyngeal erythema.  Eyes:     Conjunctiva/sclera: Conjunctivae normal.     Pupils: Pupils are equal, round, and reactive to light.  Cardiovascular:     Rate and Rhythm: Normal rate and regular rhythm.     Heart sounds: No murmur heard. No friction rub. No gallop.   Pulmonary:     Effort: No respiratory distress.     Breath sounds: No stridor. No wheezing or rhonchi.  Chest:     Chest wall: No tenderness.  Abdominal:     General: Abdomen is flat. Bowel sounds are normal. There is no distension.     Palpations: Abdomen is soft. There is no mass.     Tenderness: There is no abdominal tenderness. There is no guarding.  Musculoskeletal:        General: No swelling or deformity.     Cervical back: Normal range of motion and neck supple. No rigidity or tenderness.     Right lower  leg: No edema.     Left lower leg: No edema.  Skin:    General: Skin is warm and dry.     Coloration: Skin is not jaundiced.     Findings: No erythema.  Neurological:     Mental Status: She is alert and oriented to person, place, and time. Mental status is at baseline.  Psychiatric:        Mood and Affect: Mood normal.        Behavior: Behavior normal.        Thought Content: Thought content normal.        Judgment: Judgment normal.     Results for orders placed or performed in visit on 01/12/21  Microscopic Examination   Urine  Result Value Ref Range   WBC, UA 0-5 0 - 5 /hpf   RBC 0-2 0 - 2 /hpf   Epithelial Cells (non renal) 0-10 0 - 10 /hpf   Mucus, UA Present (A) Not Estab.   Bacteria, UA Few (A) None seen/Few  Comprehensive metabolic panel  Result Value Ref Range   Glucose 88 65  - 99 mg/dL   BUN 13 8 - 27 mg/dL   Creatinine, Ser 1.02 (H) 0.57 - 1.00 mg/dL   eGFR 61 >59 mL/min/1.73   BUN/Creatinine Ratio 13 12 - 28   Sodium 141 134 - 144 mmol/L   Potassium 3.4 (L) 3.5 - 5.2 mmol/L   Chloride 102 96 - 106 mmol/L   CO2 22 20 - 29 mmol/L   Calcium 10.0 8.7 - 10.3 mg/dL   Total Protein 8.1 6.0 - 8.5 g/dL   Albumin 4.1 3.8 - 4.8 g/dL   Globulin, Total 4.0 1.5 - 4.5 g/dL   Albumin/Globulin Ratio 1.0 (L) 1.2 - 2.2   Bilirubin Total 0.3 0.0 - 1.2 mg/dL   Alkaline Phosphatase 96 44 - 121 IU/L   AST 23 0 - 40 IU/L   ALT 14 0 - 32 IU/L  Urinalysis, Routine w reflex microscopic  Result Value Ref Range   Specific Gravity, UA 1.025 1.005 - 1.030   pH, UA 6.5 5.0 - 7.5   Color, UA Yellow Yellow   Appearance Ur Cloudy (A) Clear   Leukocytes,UA Negative Negative   Protein,UA 2+ (A) Negative/Trace   Glucose, UA Negative Negative   Ketones, UA Negative Negative   RBC, UA Negative Negative   Bilirubin, UA Negative Negative   Urobilinogen, Ur 0.2 0.2 - 1.0 mg/dL   Nitrite, UA Negative Negative   Microscopic Examination See below:   CBC with Differential/Platelet  Result Value Ref Range   WBC 6.1 3.4 - 10.8 x10E3/uL   RBC 4.85 3.77 - 5.28 x10E6/uL   Hemoglobin 11.0 (L) 11.1 - 15.9 g/dL   Hematocrit 36.8 34.0 - 46.6 %   MCV 76 (L) 79 - 97 fL   MCH 22.7 (L) 26.6 - 33.0 pg   MCHC 29.9 (L) 31.5 - 35.7 g/dL   RDW 21.4 (H) 11.7 - 15.4 %   Platelets 358 150 - 450 x10E3/uL   Neutrophils 56 Not Estab. %   Lymphs 33 Not Estab. %   Monocytes 7 Not Estab. %   Eos 2 Not Estab. %   Basos 2 Not Estab. %   Neutrophils Absolute 3.4 1.4 - 7.0 x10E3/uL   Lymphocytes Absolute 2.1 0.7 - 3.1 x10E3/uL   Monocytes Absolute 0.5 0.1 - 0.9 x10E3/uL   EOS (ABSOLUTE) 0.1 0.0 - 0.4 x10E3/uL   Basophils Absolute 0.1 0.0 - 0.2  x10E3/uL   Immature Granulocytes 0 Not Estab. %   Immature Grans (Abs) 0.0 0.0 - 0.1 x10E3/uL  TSH  Result Value Ref Range   TSH 49.300 (H) 0.450 - 4.500 uIU/mL         Current Outpatient Medications:  .  amLODipine (NORVASC) 5 MG tablet, Take 5 mg by mouth daily., Disp: , Rfl:  .  Cholecalciferol (VITAMIN D) 50 MCG (2000 UT) tablet, Take 2,000 Units by mouth daily., Disp: , Rfl:  .  Iron-Vitamin C 65-125 MG TABS, Take 1 tablet by mouth in the morning and at bedtime. Will recheck iron level next visit., Disp: 60 tablet, Rfl: 4 .  levothyroxine (SYNTHROID) 25 MCG tablet, Take 1 tablet (25 mcg total) by mouth daily before breakfast., Disp: 90 tablet, Rfl: 3 .  lisinopril (ZESTRIL) 20 MG tablet, Take 1 tablet (20 mg total) by mouth daily., Disp: 90 tablet, Rfl: 3 .  metoprolol succinate (TOPROL-XL) 25 MG 24 hr tablet, Take 1 tablet (25 mg total) by mouth daily., Disp: 30 tablet, Rfl: 1 .  Multiple Vitamin (MULTIVITAMIN) tablet, Take 1 tablet by mouth daily., Disp: , Rfl:  .  amiodarone (PACERONE) 200 MG tablet, Take 1 tablet (200 mg total) by mouth daily. (Patient not taking: Reported on 02/02/2021), Disp: 90 tablet, Rfl: 4 .  pantoprazole (PROTONIX) 40 MG tablet, Take 1 tablet (40 mg total) by mouth daily., Disp: 42 tablet, Rfl: 0    Assessment & Plan:  1. HTN increase lisinopril 40 mg ,  Continue current meds.  Medication compliance emphasised. pt advised to keep Bp logs. Pt verbalised understanding of the same. Pt to have a low salt diet . Exercise to reach a goal of at least 150 mins a week.  lifestyle modifications explained and pt understands importance of the above.   2/ Afib  -Stop amiodarone  is also on metoprolol   -to hold off anticoagulation for now because of recent bleeding issues precipitating hemorrhagic shock/and no apparent GI follow-up as of yet to help solidify etiology of bleed.  10/27/2020 where she was found to be in hemorrhagic shock with hemoglobin of 2.7. FOBT plus stool. MICU admission for management. On 10/29/2020 patient status post EGD with no abnormalities noted per GI Dr. Ferman Hamming patient was seen and examined and  hemoglobin was stable. Xarelto was discontinued. GI follow-up was planned. Hemoglobin was 8.2 on time of discharge. Low hemoglobin was likely driven by chronic anticoagulation. She was prescribed Protonix 40 mg daily x6 weeks. She was discharged home on amiodarone without anticoagulation.    3. Hypothyroidism is on 25 mcg of levothyroxine for such  Sees endo @ [facility].  Thyroid: Most recent TSH was 49.300 elevated/abnormal Increase to 50 mcg for such fu with endocriinologist asap.    Problem List Items Addressed This Visit   None      Follow up plan: No follow-ups on file.

## 2021-02-09 ENCOUNTER — Telehealth: Payer: Self-pay

## 2021-02-09 NOTE — Progress Notes (Signed)
Chronic Care Management Pharmacy Assistant   Name: DARLENE BROZOWSKI  MRN: [id number] DOB: 1956/05/04  Reason for Encounter: Hypertension Disease State Call   Recent office visits:   02/05/21- Charlynne Cousins, MD - hyperlipidemia, increased levothyroxine from 61mcg daily before breakfast to 55mcg daily, increased lisinopril from 20mg  to 40mg  daily, stopped amiodarone    Recent consult visits:  No recent visits  Hospital visits:  None in previous 6 months  Medications: Outpatient Encounter Medications as of 02/09/2021  Medication Sig  . amLODipine (NORVASC) 5 MG tablet Take 5 mg by mouth daily.  . Cholecalciferol (VITAMIN D) 50 MCG (2000 UT) tablet Take 2,000 Units by mouth daily.  . Iron-Vitamin C 65-125 MG TABS Take 1 tablet by mouth in the morning and at bedtime. Will recheck iron level next visit.  Marland Kitchen levothyroxine (SYNTHROID) 50 MCG tablet Take 1 tablet (50 mcg total) by mouth daily before breakfast.  . lisinopril (ZESTRIL) 40 MG tablet Take 1 tablet (40 mg total) by mouth daily.  . metoprolol succinate (TOPROL-XL) 25 MG 24 hr tablet Take 1 tablet (25 mg total) by mouth daily.  . Multiple Vitamin (MULTIVITAMIN) tablet Take 1 tablet by mouth daily.  . pantoprazole (PROTONIX) 40 MG tablet Take 1 tablet (40 mg total) by mouth daily.   No facility-administered encounter medications on file as of 02/09/2021.   Reviewed chart prior to disease state call. Spoke with patient regarding BP  Recent Office Vitals: BP Readings from Last 3 Encounters:  02/02/21 (!) 164/74  01/12/21 (!) 167/90  12/01/20 136/80   Pulse Readings from Last 3 Encounters:  02/02/21 (!) 51  01/12/21 62  12/01/20 65    Wt Readings from Last 3 Encounters:  02/02/21 223 lb 6.4 oz (101.3 kg)  01/12/21 222 lb (100.7 kg)  10/30/20 205 lb 0.4 oz (93 kg)     Kidney Function Lab Results  Component Value Date/Time   CREATININE 1.02 (H) 01/12/2021 09:33 AM   CREATININE 1.12 (H) 12/01/2020 11:40 AM   GFRNONAA 52 (L)  12/01/2020 11:40 AM   GFRNONAA 57 (L) 10/30/2020 04:30 AM   GFRAA 60 12/01/2020 11:40 AM    BMP Latest Ref Rng & Units 01/12/2021 12/01/2020 10/30/2020  Glucose 65 - 99 mg/dL 88 85 85  BUN 8 - 27 mg/dL 13 12 8   Creatinine 0.57 - 1.00 mg/dL 1.02(H) 1.12(H) 1.08(H)  BUN/Creat Ratio 12 - 28 13 11(L) -  Sodium 134 - 144 mmol/L 141 139 134(L)  Potassium 3.5 - 5.2 mmol/L 3.4(L) 4.0 3.4(L)  Chloride 96 - 106 mmol/L 102 102 104  CO2 20 - 29 mmol/L 22 24 24   Calcium 8.7 - 10.3 mg/dL 10.0 10.4(H) 8.9    Current antihypertensive regimen:  Metoprolol succinate 25 mg- take one tab daily Lisinopril 40mg - take one tab daily  How often are you checking your Blood Pressure?  Patient stated she checks bp daily   Current home BP readings:  Patient did not offer any readings when requested.   What recent interventions/DTPs have been made by any provider to improve Blood Pressure control since last CPP Visit:  02/05/21- Charlynne Cousins, MD -  increased lisinopril from 20mg  to 40mg  daily  Any recent hospitalizations or ED visits since last visit with CPP? No   What diet changes have been made to improve Blood Pressure Control?  Patient stated no changes have been made.  What exercise is being done to improve your Blood Pressure Control?  Patient stated she just finished  therapy last month and has now been trying to stay active with her sons.  Adherence Review: Is the patient currently on ACE/ARB medication? Yes  Lisinopril 40mg -90DS last filled 02/02/21  Does the patient have >5 day gap between last estimated fill dates? No  Star Rating Drugs: Lisinopril 40mg -90DS last filled 02/02/21  Wilford Sports CPA,CMA

## 2021-02-20 ENCOUNTER — Ambulatory Visit: Payer: Medicare Other | Admitting: Gastroenterology

## 2021-03-09 ENCOUNTER — Other Ambulatory Visit: Payer: Medicare Other

## 2021-03-09 DIAGNOSIS — E042 Nontoxic multinodular goiter: Secondary | ICD-10-CM | POA: Diagnosis not present

## 2021-03-09 DIAGNOSIS — E559 Vitamin D deficiency, unspecified: Secondary | ICD-10-CM | POA: Diagnosis not present

## 2021-03-09 DIAGNOSIS — E890 Postprocedural hypothyroidism: Secondary | ICD-10-CM | POA: Diagnosis not present

## 2021-03-09 DIAGNOSIS — E213 Hyperparathyroidism, unspecified: Secondary | ICD-10-CM | POA: Diagnosis not present

## 2021-03-12 ENCOUNTER — Ambulatory Visit (INDEPENDENT_AMBULATORY_CARE_PROVIDER_SITE_OTHER): Payer: Medicare Other | Admitting: Pharmacist

## 2021-03-12 DIAGNOSIS — E782 Mixed hyperlipidemia: Secondary | ICD-10-CM | POA: Diagnosis not present

## 2021-03-12 DIAGNOSIS — I10 Essential (primary) hypertension: Secondary | ICD-10-CM

## 2021-03-12 DIAGNOSIS — M159 Polyosteoarthritis, unspecified: Secondary | ICD-10-CM | POA: Diagnosis not present

## 2021-03-12 NOTE — Progress Notes (Signed)
Chronic Care Management Pharmacy Note  03/15/2021 Name:  Courtney Anderson MRN:  [id number] DOB:  1955/11/15  Subjective: Courtney Anderson is an 65 y.o. year old female who is a primary patient of Vigg, Avanti, MD.  The CCM team was consulted for assistance with disease management and care coordination needs.    Engaged with patient by telephone for initial visit in response to provider referral for pharmacy case management and/or care coordination services.   Consent to Services:  The patient was given information about Chronic Care Management services, agreed to services, and gave verbal consent prior to initiation of services.  Please see initial visit note for detailed documentation.   Patient Care Team: Charlynne Cousins, MD as PCP - General Greg Cutter, LCSW as Social Worker (Licensed Clinical Social Worker) Vladimir Faster, [facility] (Pharmacist)  Recent office visits: 02/02/21- Vigg (PCP)-increase lisinopril to 40 mg qd,  increase levo to 50 mcg 01/12/21- Vigg(pcp)- blood work- restart metoprolol  Recent consult visits: 03/09/21-O'Connell (endo)-bloodwork, postablative hypothyroidism, levo inc to 88 mcg/qd, increase D to 2000 iu qd 01/19/21- Zenia Resides (cards)- amlodipine 5 mg qd, ecg = sinus brady, stop amio, follow up in one month 11/06/20- Kowalski(cards)- hold asa/ anticoag f/u 3/18  Hospital visits: 10/27/20-10/30/20- ED , admitted to [facility] with GIB, hemorrhagic shock with Hb 2.7, FOBT + stool- D.c Xarelto   Objective:  Lab Results  Component Value Date   CREATININE 1.02 (H) 01/12/2021   BUN 13 01/12/2021   GFRNONAA 52 (L) 12/01/2020   GFRAA 60 12/01/2020   NA 141 01/12/2021   K 3.4 (L) 01/12/2021   CALCIUM 10.0 01/12/2021   CO2 22 01/12/2021    Lab Results  Component Value Date/Time   HGBA1C 7.1 (H) 08/03/2020 04:30 AM   HGBA1C 6.0 06/02/2015 11:07 AM   MICROALBUR 10 06/02/2015 11:07 AM    Last diabetic Eye exam: No results found for: HMDIABEYEEXA  Last diabetic Foot  exam: No results found for: HMDIABFOOTEX   Lab Results  Component Value Date   CHOL 202 (H) 12/01/2020   HDL 52 12/01/2020   LDLCALC 137 (H) 12/01/2020   TRIG 71 12/01/2020   CHOLHDL 3.9 05/13/2018    Hepatic Function Latest Ref Rng & Units 01/12/2021 12/01/2020 10/27/2020  Total Protein 6.0 - 8.5 g/dL 8.1 7.4 6.0(L)  Albumin 3.8 - 4.8 g/dL 4.1 3.8 2.9(L)  AST 0 - 40 IU/L _0 ALT 0 - 32 IU/L _1 Alk Phosphatase 44 - 121 IU/L 96 74 42  Total Bilirubin 0.0 - 1.2 mg/dL 0.3 0.3 0.6  Bilirubin, Direct 0.0 - 0.2 mg/dL - - -    Lab Results  Component Value Date/Time   TSH 49.300 (H) 01/12/2021 09:33 AM   TSH 71.200 (H) 12/01/2020 11:40 AM   FREET4 0.66 (L) 12/01/2020 11:40 AM   FREET4 0.94 07/28/2020 05:01 AM    CBC Latest Ref Rng & Units 01/12/2021 12/01/2020 10/30/2020  WBC 3.4 - 10.8 x10E3/uL 6.1 7.7 9.2  Hemoglobin 11.1 - 15.9 g/dL 11.0(L) 8.6(L) 8.3(L)  Hematocrit 34.0 - 46.6 % 36.8 28.7(L) 25.5(L)  Platelets 150 - 450 x10E3/uL 358 436 297    Lab Results  Component Value Date/Time   VD25OH 33.9 12/01/2020 11:40 AM   VD25OH 26.4 (L) 10/22/2019 11:00 AM    Clinical ASCVD: No  The 10-year ASCVD risk score Mikey Bussing DC Jr., et al., 2013) is: 9.7%   Values used to calculate the score:     Age:  88 years     Sex: Female     Is Non-Hispanic African American: Yes     Diabetic: No     Tobacco smoker: No     Systolic Blood Pressure: 540 mmHg     Is BP treated: Yes     HDL Cholesterol: 52 mg/dL     Total Cholesterol: 202 mg/dL    Depression screen [facility] 2/9 01/12/2021 12/01/2020 10/25/2020  Decreased Interest 0 0 0  Down, Depressed, Hopeless 0 0 0  PHQ - 2 Score 0 0 0     CHA2DS2/VAS Stroke Risk Points  Current as of yesterday     3 >= 2 Points: High Risk  1 - 1.99 Points: Medium Risk  0 Points: Low Risk    No Change      Details    This score determines the patient's risk of having a stroke if the  patient has atrial fibrillation.       Points Metrics  0 Has  Congestive Heart Failure:  No    Current as of yesterday  0 Has Vascular Disease:  No    Current as of yesterday  1 Has Hypertension:  Yes    Current as of yesterday  1 Age:  34    Current as of yesterday  0 Has Diabetes:  No    Current as of yesterday  0 Had Stroke:  No  Had TIA:  No  Had Thromboembolism:  No    Current as of yesterday  1 Female:  Yes    Current as of yesterday            Social History   Tobacco Use  Smoking Status Former Smoker  Smokeless Tobacco Never Used   BP Readings from Last 3 Encounters:  02/02/21 (!) 164/74  01/12/21 (!) 167/90  12/01/20 136/80   Pulse Readings from Last 3 Encounters:  02/02/21 (!) 51  01/12/21 62  12/01/20 65   Wt Readings from Last 3 Encounters:  02/02/21 223 lb 6.4 oz (101.3 kg)  01/12/21 222 lb (100.7 kg)  10/30/20 205 lb 0.4 oz (93 kg)    Assessment/Interventions: Review of patient past medical history, allergies, medications, health status, including review of consultants reports, laboratory and other test data, was performed as part of comprehensive evaluation and provision of chronic care management services.   SDOH:  (Social Determinants of Health) assessments and interventions performed: No     Immunization History  Administered Date(s) Administered  . Influenza,inj,Quad PF,6+ Mos 12/19/2016, 08/29/2017, 10/22/2019  . PFIZER(Purple Top)SARS-COV-2 Vaccination 12/15/2020, 01/05/2021  . Td 08/24/2007    Conditions to be addressed/monitored:  Hypertension, Hyperlipidemia, Atrial Fibrillation and Hypothyroidism (h/o GIB on Xarelto)  Care Plan : CCM Pharmacy Care Plan  Updates made by Vladimir Faster, [place] since 03/15/2021 12:00 AM    Problem: Afib, h/o GIB on anticoagulation, HTN, HLD,hypothyroidism   Priority: High    Long-Range Goal: disease Management   Start Date: 01/15/2021  Recent Progress: Not on track  Priority: High  Note:   Current Barriers:   . Unable to independently monitor therapeutic  efficacy . Unable to self administer medications as prescribed . Does not contact provider office for questions/concerns   Pharmacist Clinical Goal(s):  . patient will achieve adherence to monitoring guidelines and medication adherence to achieve therapeutic efficacy . achieve control of hypertension as evidenced by BP readings . achieve ability to self administer medications as prescribed through use of pill box as evidenced by patient  report . contact provider office for questions/concerns as evidenced notation of same in electronic health record through collaboration with PharmD and provider.    Interventions: . 1:1 collaboration with Charlynne Cousins, MD regarding development and update of comprehensive plan of care as evidenced by provider attestation and co-signature . Inter-disciplinary care team collaboration (see longitudinal plan of care) . Comprehensive medication review performed; medication list updated in electronic medical record .  BP Readings from Last 3 Encounters:  01/12/21 (!) 167/90  12/01/20 136/80  10/30/20 135/62    Hypertension (BP goal <130/80) -Uncontrolled -Current treatment: . Metoprolol succinate 25 mg qd . Lisinopril 40 mg qd . Amlodipine 5 mg qd -Medications previously tried: NA -Current home readings: 135-140/60-70s checking qod  -Current dietary habits: reducing sodium, trying to eat healthy -Current exercise habits: walking in her house everyday, reports PT no longer coming  -Denies hypotensive/hypertensive symptoms -Educated on BP goals and benefits of medications for prevention of heart attack, stroke and kidney damage; Daily salt intake goal < 2300 mg; Exercise goal of 150 minutes per week; Importance of home blood pressure monitoring; -Counseled to monitor BP at home bid, document, and provide log at future appointments -Counseled on diet and exercise extensively Recommended patient monitor BP even if she doesn't feely symptomatic Educated  on restart Metoprolol per Dr. Levada Dy notes on 3/11   The 10-year ASCVD risk score Mikey Bussing DC Jr., et al., 2013) is: 20.2% Lab Results  Component Value Date   LDLCALC 137 (H) 12/01/2020    Hyperlipidemia: (LDL goal < 100) -Uncontrolled -Current treatment: . NONE -Medications previously tried: NA  -Educated on Cholesterol goals;  Benefits of statin for ASCVD risk reduction; Importance of limiting foods high in cholesterol; Exercise goal of 150 minutes per week; -Recommended moderate intensity rosuvastatin given high ASCVD risk   Atrial Fibrillation (Goal: prevent stroke and major bleeding) -Not ideally controlled -CHADSVASC: 3  -Current treatment:   Rhythm control: none . Rate control: Toprol xl 25 mg qd . Anticoagulation: on hold d/t GIB wit hemorrhagic shock in December -Medications previously tried: NA -Home BP and HR readings: 135-140/60-70s -Counseled on importance of regular laboratory monitoring; -Counseled on diet and exercise extensively Counseled on importance of keeping appointments. She has follow up with cardiology on Friday. Assessed patient finances. LIS application completed during visit. 03/12/21 Patient reports she was denied. She will find her paperwork and bring a copy to her next appointment. Will collaborate with LCSW for outreach and connection of community resources.  Community care guide referral placed for assistance with utility and food      Hypothyroidism (Goal: minimze symptoms, avoid complications) -Uncontrolled -Current treatment  . Levothyroxine 25 mcg qd -Medications previously tried: NA  -Recommended to continue current medication Recommended patient follow up with endocrinology Educated on signs and symptoms or thyroid disorder including fatigue, weakness and cold intolerance. Amiodarone may diminish the effectiveness of .levothyroxine and patient should have regular monitoring and contact prescriber if symptoms devwelop.   Patient  Goals/Self-Care Activities . patient will:  - take medications as prescribed check blood pressure twice daily, document, and provide at future appointments collaborate with provider on medication access solutions target a minimum of 150 minutes of moderate intensity exercise weekly engage in dietary modifications by reducing sodium, heart healthy diet  Follow Up Plan: Telephone follow up appointment with care management team member scheduled for: 2 months PharmD, 2-4 weeks CPA         Medication Assistance: Completed Medicare LIS application during appointment 03/12/21- patient  reports she was denied.  Patient's preferred pharmacy is:  [facility] [address] ([address]), [place] [address] [place] ([place]) [place] [address] Phone: [phone] Fax: [phone]  Uses pill box? No - uses vials Pt endorses 95% compliance  We discussed: Benefits of medication synchronization, packaging and delivery as well as enhanced pharmacist oversight with [employer]. Patient decided to: Continue current medication management strategy  Care Plan and Follow Up Patient Decision:  Patient agrees to Care Plan and Follow-up.  Plan: Telephone follow up appointment with care management team member scheduled for:  CPA- one month, PharmD 2 months  [name]. Kenton Kingfisher PharmD, [place] [facility] [phone]

## 2021-03-13 ENCOUNTER — Other Ambulatory Visit: Payer: Self-pay | Admitting: Internal Medicine

## 2021-03-15 NOTE — Patient Instructions (Addendum)
Visit Information  It was a pleasure speaking with you today. Thank you for letting me be part of your clinical team. Please call with any questions or concerns.   Goals Addressed            This Visit's Progress   . Track and Manage Heart Rate and Rhythm-Atrial Fibrillation   Not on track    Timeframe:  Short-Term Goal Priority:  High Start Date:                             Expected End Date:                       Follow Up Date 2 month follow up   - check pulse (heart) rate once a day - cut down alcohol use - make a plan to exercise regularly - make a plan to eat healthy - keep all lab appointments - take medicine as prescribed - wear medical alert identification    Why is this important?    Atrial fibrillation may have no symptoms. Sometimes the symptoms get worse or happen more often.   It is important to keep track of what your symptoms are and when they happen.   A change in symptoms is important to discuss with your doctor or nurse.   Being active and healthy eating will also help you manage your heart condition.     Notes:     . Track and Manage My Blood Pressure-Hypertension   On track    Timeframe:  Short-Term Goal Priority:  High Start Date:                             Expected End Date:                       Follow Up Date one month CPA, 2 month PharmD    - check blood pressure daily - write blood pressure results in a log or diary    Why is this important?    You won't feel high blood pressure, but it can still hurt your blood vessels.   High blood pressure can cause heart or kidney problems. It can also cause a stroke.   Making lifestyle changes like losing a little weight or eating less salt will help.   Checking your blood pressure at home and at different times of the day can help to control blood pressure.   If the doctor prescribes medicine remember to take it the way the doctor ordered.   Call the office if you cannot afford the  medicine or if there are questions about it.     Notes:        The patient verbalized understanding of instructions, educational materials, and care plan provided today and declined offer to receive copy of patient instructions, educational materials, and care plan.   Telephone follow up appointment with pharmacy team member scheduled for: 2-4 weeks CPA, 2 Months PharmD  [name]. Dalonte Hardage PharmD, BCPS Clinical Pharmacist [phone]  Managing Your Hypertension Hypertension, also called high blood pressure, is when the force of the blood pressing against the walls of the arteries is too strong. Arteries are blood vessels that carry blood from your heart throughout your body. Hypertension forces the heart to work harder to pump blood and may cause the arteries to become narrow  or stiff. Understanding blood pressure readings Your personal target blood pressure may vary depending on your medical conditions, your age, and other factors. A blood pressure reading includes a higher number over a lower number. Ideally, your blood pressure should be below 120/80. You should know that:  The first, or top, number is called the systolic pressure. It is a measure of the pressure in your arteries as your heart beats.  The second, or bottom number, is called the diastolic pressure. It is a measure of the pressure in your arteries as the heart relaxes. Blood pressure is classified into four stages. Based on your blood pressure reading, your health care provider may use the following stages to determine what type of treatment you need, if any. Systolic pressure and diastolic pressure are measured in a unit called mmHg. Normal  Systolic pressure: below 607.  Diastolic pressure: below 80. Elevated  Systolic pressure: 371-062.  Diastolic pressure: below 80. Hypertension stage 1  Systolic pressure: 694-854.  Diastolic pressure: 62-70. Hypertension stage 2  Systolic pressure: 350 or  above.  Diastolic pressure: 90 or above. How can this condition affect me? Managing your hypertension is an important responsibility. Over time, hypertension can damage the arteries and decrease blood flow to important parts of the body, including the brain, heart, and kidneys. Having untreated or uncontrolled hypertension can lead to:  A heart attack.  A stroke.  A weakened blood vessel (aneurysm).  Heart failure.  Kidney damage.  Eye damage.  Metabolic syndrome.  Memory and concentration problems.  Vascular dementia. What actions can I take to manage this condition? Hypertension can be managed by making lifestyle changes and possibly by taking medicines. Your health care provider will help you make a plan to bring your blood pressure within a normal range. Nutrition  Eat a diet that is high in fiber and potassium, and low in salt (sodium), added sugar, and fat. An example eating plan is called the Dietary Approaches to Stop Hypertension (DASH) diet. To eat this way: ? Eat plenty of fresh fruits and vegetables. Try to fill one-half of your plate at each meal with fruits and vegetables. ? Eat whole grains, such as whole-wheat pasta, brown rice, or whole-grain bread. Fill about one-fourth of your plate with whole grains. ? Eat low-fat dairy products. ? Avoid fatty cuts of meat, processed or cured meats, and poultry with skin. Fill about one-fourth of your plate with lean proteins such as fish, chicken without skin, beans, eggs, and tofu. ? Avoid pre-made and processed foods. These tend to be higher in sodium, added sugar, and fat.  Reduce your daily sodium intake. Most people with hypertension should eat less than 1,500 mg of sodium a day.   Lifestyle  Work with your health care provider to maintain a healthy body weight or to lose weight. Ask what an ideal weight is for you.  Get at least 30 minutes of exercise that causes your heart to beat faster (aerobic exercise) most days  of the week. Activities may include walking, swimming, or biking.  Include exercise to strengthen your muscles (resistance exercise), such as weight lifting, as part of your weekly exercise routine. Try to do these types of exercises for 30 minutes at least 3 days a week.  Do not use any products that contain nicotine or tobacco, such as cigarettes, e-cigarettes, and chewing tobacco. If you need help quitting, ask your health care provider.  Control any long-term (chronic) conditions you have, such as high cholesterol or  diabetes.  Identify your sources of stress and find ways to manage stress. This may include meditation, deep breathing, or making time for fun activities.   Alcohol use  Do not drink alcohol if: ? Your health care provider tells you not to drink. ? You are pregnant, may be pregnant, or are planning to become pregnant.  If you drink alcohol: ? Limit how much you use to:  0-1 drink a day for women.  0-2 drinks a day for men. ? Be aware of how much alcohol is in your drink. In the [place] one drink equals one 12 oz bottle of beer (355 mL), one 5 oz glass of wine (148 mL), or one 1 oz glass of hard liquor (44 mL). Medicines Your health care provider may prescribe medicine if lifestyle changes are not enough to get your blood pressure under control and if:  Your systolic blood pressure is 130 or higher.  Your diastolic blood pressure is 80 or higher. Take medicines only as told by your health care provider. Follow the directions carefully. Blood pressure medicines must be taken as told by your health care provider. The medicine does not work as well when you skip doses. Skipping doses also puts you at risk for problems. Monitoring Before you monitor your blood pressure:  Do not smoke, drink caffeinated beverages, or exercise within 30 minutes before taking a measurement.  Use the bathroom and empty your bladder (urinate).  Sit quietly for at least 5 minutes before  taking measurements. Monitor your blood pressure at home as told by your health care provider. To do this:  Sit with your back straight and supported.  Place your feet flat on the floor. Do not cross your legs.  Support your arm on a flat surface, such as a table. Make sure your upper arm is at heart level.  Each time you measure, take two or three readings one minute apart and record the results. You may also need to have your blood pressure checked regularly by your health care provider.   General information  Talk with your health care provider about your diet, exercise habits, and other lifestyle factors that may be contributing to hypertension.  Review all the medicines you take with your health care provider because there may be side effects or interactions.  Keep all visits as told by your health care provider. Your health care provider can help you create and adjust your plan for managing your high blood pressure. Where to find more information  National Heart, Lung, and Blood Institute: https://wilson-eaton.com/  American Heart Association: www.heart.org Contact a health care provider if:  You think you are having a reaction to medicines you have taken.  You have repeated (recurrent) headaches.  You feel dizzy.  You have swelling in your ankles.  You have trouble with your vision. Get help right away if:  You develop a severe headache or confusion.  You have unusual weakness or numbness, or you feel faint.  You have severe pain in your chest or abdomen.  You vomit repeatedly.  You have trouble breathing. These symptoms may represent a serious problem that is an emergency. Do not wait to see if the symptoms will go away. Get medical help right away. Call your local emergency services (911 in the [place] Do not drive yourself to the hospital. Summary  Hypertension is when the force of blood pumping through your arteries is too strong. If this condition is not controlled,  it may put you at  risk for serious complications.  Your personal target blood pressure may vary depending on your medical conditions, your age, and other factors. For most people, a normal blood pressure is less than 120/80.  Hypertension is managed by lifestyle changes, medicines, or both.  Lifestyle changes to help manage hypertension include losing weight, eating a healthy, low-sodium diet, exercising more, stopping smoking, and limiting alcohol. This information is not intended to replace advice given to you by your health care provider. Make sure you discuss any questions you have with your health care provider. Document Revised: 11/26/2019 Document Reviewed: 09/21/2019 Elsevier Patient Education  2021 [employer].

## 2021-03-16 ENCOUNTER — Ambulatory Visit: Payer: Medicare Other | Admitting: Internal Medicine

## 2021-03-16 DIAGNOSIS — I10 Essential (primary) hypertension: Secondary | ICD-10-CM | POA: Diagnosis not present

## 2021-03-16 DIAGNOSIS — E782 Mixed hyperlipidemia: Secondary | ICD-10-CM | POA: Diagnosis not present

## 2021-03-16 DIAGNOSIS — R001 Bradycardia, unspecified: Secondary | ICD-10-CM | POA: Diagnosis not present

## 2021-03-16 DIAGNOSIS — I4891 Unspecified atrial fibrillation: Secondary | ICD-10-CM | POA: Diagnosis not present

## 2021-03-23 ENCOUNTER — Encounter: Payer: Self-pay | Admitting: Internal Medicine

## 2021-03-23 ENCOUNTER — Other Ambulatory Visit: Payer: Self-pay

## 2021-03-23 ENCOUNTER — Ambulatory Visit (INDEPENDENT_AMBULATORY_CARE_PROVIDER_SITE_OTHER): Payer: Medicare Other | Admitting: Internal Medicine

## 2021-03-23 VITALS — BP 131/70 | HR 54 | Temp 98.5°F | Ht 65.0 in | Wt 229.0 lb

## 2021-03-23 DIAGNOSIS — Z13820 Encounter for screening for osteoporosis: Secondary | ICD-10-CM

## 2021-03-23 DIAGNOSIS — Z1239 Encounter for other screening for malignant neoplasm of breast: Secondary | ICD-10-CM | POA: Diagnosis not present

## 2021-03-23 DIAGNOSIS — Z23 Encounter for immunization: Secondary | ICD-10-CM

## 2021-03-23 DIAGNOSIS — Z124 Encounter for screening for malignant neoplasm of cervix: Secondary | ICD-10-CM | POA: Diagnosis not present

## 2021-03-23 DIAGNOSIS — E039 Hypothyroidism, unspecified: Secondary | ICD-10-CM

## 2021-03-23 NOTE — Progress Notes (Signed)
BP 131/70   Pulse (!) 54   Temp 98.5 F (36.9 C) (Oral)   Ht 5' 5" (1.651 m)   Wt 229 lb (103.9 kg)   SpO2 98%   BMI 38.11 kg/m    Subjective:    Patient ID: Courtney Anderson, female    DOB: 1956-04-21, 65 y.o.   MRN: [id number]  HPI: Courtney Anderson is a 65 y.o. female  Atrial Fibrillation Presents for follow-up visit. Symptoms include hypertension. Symptoms are negative for chest pain and shortness of breath. Past medical history includes atrial fibrillation and hyperlipidemia.  Hypertension This is a chronic problem. Pertinent negatives include no chest pain or shortness of breath.  Hyperlipidemia This is a chronic problem. The problem is controlled. Pertinent negatives include no chest pain, focal sensory loss, focal weakness, myalgias or shortness of breath.    Chief Complaint  Patient presents with  . Atrial Fibrillation  . Hypertension  . Hyperlipidemia  . Hypothyroidism  . Hypoparathyroid    Relevant past medical, surgical, family and social history reviewed and updated as indicated. Interim medical history since our last visit reviewed. Allergies and medications reviewed and updated.  Review of Systems  Respiratory: Negative for shortness of breath.   Cardiovascular: Negative for chest pain.  Musculoskeletal: Negative for myalgias.  Neurological: Negative for focal weakness.    Per HPI unless specifically indicated above     Objective:    BP 131/70   Pulse (!) 54   Temp 98.5 F (36.9 C) (Oral)   Ht 5' 5" (1.651 m)   Wt 229 lb (103.9 kg)   SpO2 98%   BMI 38.11 kg/m   Wt Readings from Last 3 Encounters:  03/23/21 229 lb (103.9 kg)  02/02/21 223 lb 6.4 oz (101.3 kg)  01/12/21 222 lb (100.7 kg)    Physical Exam Vitals and nursing note reviewed.  Constitutional:      General: She is not in acute distress.    Appearance: Normal appearance. She is not ill-appearing or diaphoretic.  HENT:     Head: Normocephalic and atraumatic.     Right Ear:  Tympanic membrane and external ear normal. There is no impacted cerumen.     Left Ear: External ear normal.     Nose: No congestion or rhinorrhea.     Mouth/Throat:     Pharynx: No oropharyngeal exudate or posterior oropharyngeal erythema.  Eyes:     Conjunctiva/sclera: Conjunctivae normal.     Pupils: Pupils are equal, round, and reactive to light.  Cardiovascular:     Rate and Rhythm: Normal rate and regular rhythm.     Heart sounds: No murmur heard. No friction rub. No gallop.   Pulmonary:     Effort: No respiratory distress.     Breath sounds: No stridor. No wheezing or rhonchi.  Chest:     Chest wall: No tenderness.  Abdominal:     General: Abdomen is flat. Bowel sounds are normal. There is no distension.     Palpations: Abdomen is soft. There is no mass.     Tenderness: There is no abdominal tenderness. There is no guarding.  Musculoskeletal:        General: No swelling or deformity.     Cervical back: Normal range of motion and neck supple. No rigidity or tenderness.     Right lower leg: No edema.     Left lower leg: No edema.  Skin:    General: Skin is warm and dry.  Coloration: Skin is not jaundiced.     Findings: No erythema.  Neurological:     Mental Status: She is alert and oriented to person, place, and time. Mental status is at baseline.  Psychiatric:        Mood and Affect: Mood normal.        Behavior: Behavior normal.        Thought Content: Thought content normal.        Judgment: Judgment normal.     Results for orders placed or performed in visit on 01/12/21  Microscopic Examination   Urine  Result Value Ref Range   WBC, UA 0-5 0 - 5 /hpf   RBC 0-2 0 - 2 /hpf   Epithelial Cells (non renal) 0-10 0 - 10 /hpf   Mucus, UA Present (A) Not Estab.   Bacteria, UA Few (A) None seen/Few  Comprehensive metabolic panel  Result Value Ref Range   Glucose 88 65 - 99 mg/dL   BUN 13 8 - 27 mg/dL   Creatinine, Ser 1.02 (H) 0.57 - 1.00 mg/dL   eGFR 61 >59  mL/min/1.73   BUN/Creatinine Ratio 13 12 - 28   Sodium 141 134 - 144 mmol/L   Potassium 3.4 (L) 3.5 - 5.2 mmol/L   Chloride 102 96 - 106 mmol/L   CO2 22 20 - 29 mmol/L   Calcium 10.0 8.7 - 10.3 mg/dL   Total Protein 8.1 6.0 - 8.5 g/dL   Albumin 4.1 3.8 - 4.8 g/dL   Globulin, Total 4.0 1.5 - 4.5 g/dL   Albumin/Globulin Ratio 1.0 (L) 1.2 - 2.2   Bilirubin Total 0.3 0.0 - 1.2 mg/dL   Alkaline Phosphatase 96 44 - 121 IU/L   AST 23 0 - 40 IU/L   ALT 14 0 - 32 IU/L  Urinalysis, Routine w reflex microscopic  Result Value Ref Range   Specific Gravity, UA 1.025 1.005 - 1.030   pH, UA 6.5 5.0 - 7.5   Color, UA Yellow Yellow   Appearance Ur Cloudy (A) Clear   Leukocytes,UA Negative Negative   Protein,UA 2+ (A) Negative/Trace   Glucose, UA Negative Negative   Ketones, UA Negative Negative   RBC, UA Negative Negative   Bilirubin, UA Negative Negative   Urobilinogen, Ur 0.2 0.2 - 1.0 mg/dL   Nitrite, UA Negative Negative   Microscopic Examination See below:   CBC with Differential/Platelet  Result Value Ref Range   WBC 6.1 3.4 - 10.8 x10E3/uL   RBC 4.85 3.77 - 5.28 x10E6/uL   Hemoglobin 11.0 (L) 11.1 - 15.9 g/dL   Hematocrit 36.8 34.0 - 46.6 %   MCV 76 (L) 79 - 97 fL   MCH 22.7 (L) 26.6 - 33.0 pg   MCHC 29.9 (L) 31.5 - 35.7 g/dL   RDW 21.4 (H) 11.7 - 15.4 %   Platelets 358 150 - 450 x10E3/uL   Neutrophils 56 Not Estab. %   Lymphs 33 Not Estab. %   Monocytes 7 Not Estab. %   Eos 2 Not Estab. %   Basos 2 Not Estab. %   Neutrophils Absolute 3.4 1.4 - 7.0 x10E3/uL   Lymphocytes Absolute 2.1 0.7 - 3.1 x10E3/uL   Monocytes Absolute 0.5 0.1 - 0.9 x10E3/uL   EOS (ABSOLUTE) 0.1 0.0 - 0.4 x10E3/uL   Basophils Absolute 0.1 0.0 - 0.2 x10E3/uL   Immature Granulocytes 0 Not Estab. %   Immature Grans (Abs) 0.0 0.0 - 0.1 x10E3/uL  TSH  Result Value Ref Range  TSH 49.300 (H) 0.450 - 4.500 uIU/mL        Current Outpatient Medications:  .  amLODipine (NORVASC) 5 MG tablet, Take 5 mg by  mouth daily., Disp: , Rfl:  .  Cholecalciferol (VITAMIN D) 50 MCG (2000 UT) tablet, Take 2,000 Units by mouth daily., Disp: , Rfl:  .  EUTHYROX 88 MCG tablet, Take 88 mcg by mouth every morning., Disp: , Rfl:  .  Iron-Vitamin C 65-125 MG TABS, Take 1 tablet by mouth in the morning and at bedtime. Will recheck iron level next visit., Disp: 60 tablet, Rfl: 4 .  lisinopril (ZESTRIL) 40 MG tablet, Take 1 tablet (40 mg total) by mouth daily., Disp: 30 tablet, Rfl: 6 .  metoprolol succinate (TOPROL-XL) 25 MG 24 hr tablet, Take 1 tablet by mouth once daily, Disp: 30 tablet, Rfl: 0 .  Multiple Vitamin (MULTIVITAMIN) tablet, Take 1 tablet by mouth daily., Disp: , Rfl:  .  Multiple Vitamins-Minerals (ONE-A-DAY WOMENS 50+ ADVANTAGE) TABS, Take 1 tablet by mouth daily., Disp: , Rfl:     Assessment & Plan:  1. HTN increase lisinopril 40 mg ,  Continue current meds.  Medication compliance emphasised. pt advised to keep Bp logs. Pt verbalised understanding of the same. Pt to have a low salt diet . Exercise to reach a goal of at least 150 mins a week.  lifestyle modifications explained and pt understands importance of the above.   2/ Afib   is also on metoprolol seen cards 3 weeks ago  -to hold off anticoagulation for now because of recent bleeding issues precipitating hemorrhagic shock/and no apparent GI follow-up as of yet to help solidify etiology of bleed.  10/27/2020 where she was found to be in hemorrhagic shock with hemoglobin of 2.7. FOBT plus stool. MICU admission for management. On 10/29/2020 patient status post EGD with no abnormalities noted per GI Dr. Ferman Hamming patient was seen and examined and hemoglobin was stable. Xarelto was discontinued. GI follow-up was planned. Hemoglobin was 8.2 on time of discharge. Low hemoglobin was likely driven by chronic anticoagulation. She was prescribed Protonix 40 mg daily x6 weeks. She was discharged home on amiodarone without anticoagulation.    3.  Hypothyroidism is on 25 mcg of levothyroxine for such  Sees endo @ [facility].  Is on 50 mcg for such sec to elevated TSH  Thyroid: Most recent TSH was 49.300 elevated/abnormal   Ref. Range 12/01/2020 11:40 01/12/2021 09:23 01/12/2021 09:33  TSH Latest Ref Range: 0.450 - 4.500 uIU/mL 71.200 (H)  49.300 (H)   4. Obesity : Lifestyle modifications advised to pt. A1c at   Portion control and avoiding high carb low fat diet advised.  Diet plan given to pt   exercise plan given and encouraged.  To increase exercise to 150 mins a week ie 21/2 hours a week. Pt verbalises understanding of the above.   Problem List Items Addressed This Visit   None      Follow up plan: No follow-ups on file.  Health Maintenance : Mammogram/ Paps smear:ordered DEXA: ordered  Cscope : 2021 Pneumonia vaccine : prevanar, today pneumovax in the past.

## 2021-03-23 NOTE — Patient Instructions (Signed)
Calorie Counting for Weight Loss Calories are units of energy. Your body needs a certain number of calories from food to keep going throughout the day. When you eat or drink more calories than your body needs, your body stores the extra calories mostly as fat. When you eat or drink fewer calories than your body needs, your body burns fat to get the energy it needs. Calorie counting means keeping track of how many calories you eat and drink each day. Calorie counting can be helpful if you need to lose weight. If you eat fewer calories than your body needs, you should lose weight. Ask your health care provider what a healthy weight is for you. For calorie counting to work, you will need to eat the right number of calories each day to lose a healthy amount of weight per week. A dietitian can help you figure out how many calories you need in a day and will suggest ways to reach your calorie goal.  A healthy amount of weight to lose each week is usually 1-2 lb (0.5-0.9 kg). This usually means that your daily calorie intake should be reduced by 500-750 calories.  Eating 1,200-1,500 calories a day can help most women lose weight.  Eating 1,500-1,800 calories a day can help most men lose weight. What do I need to know about calorie counting? Work with your health care provider or dietitian to determine how many calories you should get each day. To meet your daily calorie goal, you will need to:  Find out how many calories are in each food that you would like to eat. Try to do this before you eat.  Decide how much of the food you plan to eat.  Keep a food log. Do this by writing down what you ate and how many calories it had. To successfully lose weight, it is important to balance calorie counting with a healthy lifestyle that includes regular activity. Where do I find calorie information? The number of calories in a food can be found on a Nutrition Facts label. If a food does not have a Nutrition Facts  label, try to look up the calories online or ask your dietitian for help. Remember that calories are listed per serving. If you choose to have more than one serving of a food, you will have to multiply the calories per serving by the number of servings you plan to eat. For example, the label on a package of bread might say that a serving size is 1 slice and that there are 90 calories in a serving. If you eat 1 slice, you will have eaten 90 calories. If you eat 2 slices, you will have eaten 180 calories.   How do I keep a food log? After each time that you eat, record the following in your food log as soon as possible:  What you ate. Be sure to include toppings, sauces, and other extras on the food.  How much you ate. This can be measured in cups, ounces, or number of items.  How many calories were in each food and drink.  The total number of calories in the food you ate. Keep your food log near you, such as in a pocket-sized notebook or on an app or website on your mobile phone. Some programs will calculate calories for you and show you how many calories you have left to meet your daily goal. What are some portion-control tips?  Know how many calories are in a serving. This will   help you know how many servings you can have of a certain food.  Use a measuring cup to measure serving sizes. You could also try weighing out portions on a kitchen scale. With time, you will be able to estimate serving sizes for some foods.  Take time to put servings of different foods on your favorite plates or in your favorite bowls and cups so you know what a serving looks like.  Try not to eat straight from a food's packaging, such as from a bag or box. Eating straight from the package makes it hard to see how much you are eating and can lead to overeating. Put the amount you would like to eat in a cup or on a plate to make sure you are eating the right portion.  Use smaller plates, glasses, and bowls for smaller  portions and to prevent overeating.  Try not to multitask. For example, avoid watching TV or using your computer while eating. If it is time to eat, sit down at a table and enjoy your food. This will help you recognize when you are full. It will also help you be more mindful of what and how much you are eating. What are tips for following this plan? Reading food labels  Check the calorie count compared with the serving size. The serving size may be smaller than what you are used to eating.  Check the source of the calories. Try to choose foods that are high in protein, fiber, and vitamins, and low in saturated fat, trans fat, and sodium. Shopping  Read nutrition labels while you shop. This will help you make healthy decisions about which foods to buy.  Pay attention to nutrition labels for low-fat or fat-free foods. These foods sometimes have the same number of calories or more calories than the full-fat versions. They also often have added sugar, starch, or salt to make up for flavor that was removed with the fat.  Make a grocery list of lower-calorie foods and stick to it. Cooking  Try to cook your favorite foods in a healthier way. For example, try baking instead of frying.  Use low-fat dairy products. Meal planning  Use more fruits and vegetables. One-half of your plate should be fruits and vegetables.  Include lean proteins, such as chicken, turkey, and fish. Lifestyle Each week, aim to do one of the following:  150 minutes of moderate exercise, such as walking.  75 minutes of vigorous exercise, such as running. General information  Know how many calories are in the foods you eat most often. This will help you calculate calorie counts faster.  Find a way of tracking calories that works for you. Get creative. Try different apps or programs if writing down calories does not work for you. What foods should I eat?  Eat nutritious foods. It is better to have a nutritious,  high-calorie food, such as an avocado, than a food with few nutrients, such as a bag of potato chips.  Use your calories on foods and drinks that will fill you up and will not leave you hungry soon after eating. ? Examples of foods that fill you up are nuts and nut butters, vegetables, lean proteins, and high-fiber foods such as whole grains. High-fiber foods are foods with more than 5 g of fiber per serving.  Pay attention to calories in drinks. Low-calorie drinks include water and unsweetened drinks. The items listed above may not be a complete list of foods and beverages you can eat.   Contact a dietitian for more information.   What foods should I limit? Limit foods or drinks that are not good sources of vitamins, minerals, or protein or that are high in unhealthy fats. These include:  Candy.  Other sweets.  Sodas, specialty coffee drinks, alcohol, and juice. The items listed above may not be a complete list of foods and beverages you should avoid. Contact a dietitian for more information. How do I count calories when eating out?  Pay attention to portions. Often, portions are much larger when eating out. Try these tips to keep portions smaller: ? Consider sharing a meal instead of getting your own. ? If you get your own meal, eat only half of it. Before you start eating, ask for a container and put half of your meal into it. ? When available, consider ordering smaller portions from the menu instead of full portions.  Pay attention to your food and drink choices. Knowing the way food is cooked and what is included with the meal can help you eat fewer calories. ? If calories are listed on the menu, choose the lower-calorie options. ? Choose dishes that include vegetables, fruits, whole grains, low-fat dairy products, and lean proteins. ? Choose items that are boiled, broiled, grilled, or steamed. Avoid items that are buttered, battered, fried, or served with cream sauce. Items labeled as  crispy are usually fried, unless stated otherwise. ? Choose water, low-fat milk, unsweetened iced tea, or other drinks without added sugar. If you want an alcoholic beverage, choose a lower-calorie option, such as a glass of wine or light beer. ? Ask for dressings, sauces, and syrups on the side. These are usually high in calories, so you should limit the amount you eat. ? If you want a salad, choose a garden salad and ask for grilled meats. Avoid extra toppings such as bacon, cheese, or fried items. Ask for the dressing on the side, or ask for olive oil and vinegar or lemon to use as dressing.  Estimate how many servings of a food you are given. Knowing serving sizes will help you be aware of how much food you are eating at restaurants. Where to find more information  Centers for Disease Control and Prevention: www.cdc.gov  [place]. Department of Agriculture: myplate.gov Summary  Calorie counting means keeping track of how many calories you eat and drink each day. If you eat fewer calories than your body needs, you should lose weight.  A healthy amount of weight to lose per week is usually 1-2 lb (0.5-0.9 kg). This usually means reducing your daily calorie intake by 500-750 calories.  The number of calories in a food can be found on a Nutrition Facts label. If a food does not have a Nutrition Facts label, try to look up the calories online or ask your dietitian for help.  Use smaller plates, glasses, and bowls for smaller portions and to prevent overeating.  Use your calories on foods and drinks that will fill you up and not leave you hungry shortly after a meal. This information is not intended to replace advice given to you by your health care provider. Make sure you discuss any questions you have with your health care provider. Document Revised: 12/02/2019 Document Reviewed: 12/02/2019 Elsevier Patient Education  2021 Elsevier Inc.  

## 2021-03-24 LAB — TSH: TSH: 11.6 u[IU]/mL — ABNORMAL HIGH (ref 0.450–4.500)

## 2021-03-24 LAB — COMPREHENSIVE METABOLIC PANEL
ALT: 14 IU/L (ref 0–32)
AST: 19 IU/L (ref 0–40)
Albumin/Globulin Ratio: 1.5 (ref 1.2–2.2)
Albumin: 4.5 g/dL (ref 3.8–4.8)
Alkaline Phosphatase: 113 IU/L (ref 44–121)
BUN/Creatinine Ratio: 16 (ref 12–28)
BUN: 15 mg/dL (ref 8–27)
Bilirubin Total: 0.2 mg/dL (ref 0.0–1.2)
CO2: 23 mmol/L (ref 20–29)
Calcium: 10.1 mg/dL (ref 8.7–10.3)
Chloride: 99 mmol/L (ref 96–106)
Creatinine, Ser: 0.93 mg/dL (ref 0.57–1.00)
Globulin, Total: 3.1 g/dL (ref 1.5–4.5)
Glucose: 89 mg/dL (ref 65–99)
Potassium: 3.7 mmol/L (ref 3.5–5.2)
Sodium: 140 mmol/L (ref 134–144)
Total Protein: 7.6 g/dL (ref 6.0–8.5)
eGFR: 68 mL/min/{1.73_m2} (ref >59)

## 2021-03-24 LAB — LIPID PANEL
Chol/HDL Ratio: 4.1 ratio (ref 0.0–4.4)
Cholesterol, Total: 207 mg/dL — ABNORMAL HIGH (ref 100–199)
HDL: 51 mg/dL (ref >39)
LDL Chol Calc (NIH): 140 mg/dL — ABNORMAL HIGH (ref 0–99)
Triglycerides: 90 mg/dL (ref 0–149)
VLDL Cholesterol Cal: 16 mg/dL (ref 5–40)

## 2021-03-26 NOTE — Progress Notes (Signed)
Please let pt know this was better from last visit but still high will need to adjust dose - she is to see endocrine for such soon - please have her d/w them regarding these numbers thnx.

## 2021-03-28 ENCOUNTER — Ambulatory Visit: Payer: Self-pay | Admitting: Licensed Clinical Social Worker

## 2021-03-28 DIAGNOSIS — M159 Polyosteoarthritis, unspecified: Secondary | ICD-10-CM | POA: Diagnosis not present

## 2021-03-28 DIAGNOSIS — E782 Mixed hyperlipidemia: Secondary | ICD-10-CM | POA: Diagnosis not present

## 2021-03-28 DIAGNOSIS — I10 Essential (primary) hypertension: Secondary | ICD-10-CM

## 2021-03-28 DIAGNOSIS — Z789 Other specified health status: Secondary | ICD-10-CM

## 2021-03-28 NOTE — Patient Instructions (Signed)
Visit Information  Goals Addressed            This Visit's Progress   . Find Help in My Community   On track    Timeframe:  Short-Term Goal Priority:  High Start Date:    03/28/21                         Expected End Date: 05/03/21                     Follow Up Date 05/03/21   Patient Goals/Self-Care Activities: Over the next 45 days . Schedule appointment with [facility] Counselors . Attend all scheduled appointments with Providers . I have place a referral to the care guide for community resources, they will follow up with you         Patient verbalizes understanding of instructions provided today.   Telephone follow up appointment with care management team member scheduled for:05/03/21  Christa See, MSW, [email or web address] Phone [phone] 2:22 PM

## 2021-03-28 NOTE — Chronic Care Management (AMB) (Signed)
Chronic Care Management    Clinical Social Work Note  03/28/2021 Name: Courtney Anderson MRN: [id number] DOB: May 09, 1956  [name] is a 65 y.o. year old female who is a primary care patient of Vigg, Avanti, MD. The CCM team was consulted to assist the patient with chronic disease management and/or care coordination needs related to: [employer] .   Engaged with patient by telephone for follow up visit in response to provider referral for social work chronic care management and care coordination services.   Consent to Services:  The patient was given information about Chronic Care Management services, agreed to services, and gave verbal consent prior to initiation of services.  Please see initial visit note for detailed documentation.   Patient agreed to services and consent obtained.   Assessment: Review of patient past medical history, allergies, medications, and health status, including review of relevant consultants reports was performed today as part of a comprehensive evaluation and provision of chronic care management and care coordination services.     SDOH (Social Determinants of Health) assessments and interventions performed:    Advanced Directives Status: Not addressed in this encounter.  CCM Care Plan  Allergies  Allergen Reactions  . Codeine     Outpatient Encounter Medications as of 03/28/2021  Medication Sig  . amLODipine (NORVASC) 5 MG tablet Take 5 mg by mouth daily.  . Cholecalciferol (VITAMIN D) 50 MCG (2000 UT) tablet Take 2,000 Units by mouth daily.  . EUTHYROX 88 MCG tablet Take 88 mcg by mouth every morning.  . Iron-Vitamin C 65-125 MG TABS Take 1 tablet by mouth in the morning and at bedtime. Will recheck iron level next visit.  Marland Kitchen lisinopril (ZESTRIL) 40 MG tablet Take 1 tablet (40 mg total) by mouth daily.  . metoprolol succinate (TOPROL-XL) 25 MG 24 hr tablet Take 1 tablet by mouth once daily  . Multiple Vitamin (MULTIVITAMIN) tablet Take 1  tablet by mouth daily.  . Multiple Vitamins-Minerals (ONE-A-DAY WOMENS 50+ ADVANTAGE) TABS Take 1 tablet by mouth daily.   No facility-administered encounter medications on file as of 03/28/2021.    Patient Active Problem List   Diagnosis Date Noted  . Bradycardia 01/19/2021  . Primary hyperparathyroidism ([place]) 12/02/2020  . Hypothyroidism 12/02/2020  . Anemia 12/01/2020  . History of 2019 novel coronavirus disease (COVID-19) 11/24/2020  . Atrial fibrillation with RVR ([place]) 07/24/2020  . Vitamin D deficiency 10/17/2019  . 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 History of hyperthyroidism 08/20/2018  . Multinodular goiter 08/20/2018  . Hypertension 05/31/2015  . Morbid obesity ([place]) 05/31/2015  . Elevated transaminase level 05/31/2015  . Hyperlipidemia 05/31/2015    Conditions to be addressed/monitored: ; Financial constraints related to rental and utility assistance Healthcare premiums  Care Plan : General Social Work (Adult)  Updates made by Rebekah Chesterfield, LCSW since 03/28/2021 12:00 AM    Problem: Barriers to Treatment   Priority: Medium  Onset Date: 03/28/2021  Note:   Current barriers:   .  Financial constraints related to rental assistance, utility assistance, and healthcare premiums Clinical Goals: Patient will work with CCM LCSW to address needs related to financial strain Clinical Interventions:  . Assessment of needs and barriers to care were conducted . CCM LCSW contacted [place] [phone] with the [facility] to inquire about available appointments for [facility]. Patient was informed that she can schedule next available appt for June 9, 22 at 1 PM, 2 PM, or 3 PM Contact information was  provided and patient agreed to schedule to discuss programs that may offer lower insurance premiums . CCM LCSW discussed various local resources that can assist with food insecurity, rental, and utility assistance. Patient is not interested in [employer] or  [employer]. She agreed to allow CCM LCSW to complete care guide referral for rental and utility assistance . Referral to care guide for community support and other options   . Discussed plans with patient for ongoing care management follow up and provided patient with direct contact information for care management team . Other interventions provided:Solution-Focused Strategies, Active listening / Reflection utilized , Emotional Supportive Provided, and Verbalization of feelings encouraged  . Collaboration with Charlynne Cousins, MD regarding development and update of comprehensive plan of care as evidenced by provider attestation and co-signature . Inter-disciplinary care team collaboration (see longitudinal plan of care) Patient Goals/Self-Care Activities: Over the next 45 days . Schedule appointment with [facility] Counselors . Attend all scheduled appointments with Providers . I have place a referral to the care guide for community resources, they will follow up with you         Christa See, MSW, [email or web address] Phone [phone] 2:20 PM

## 2021-03-29 ENCOUNTER — Telehealth: Payer: Self-pay | Admitting: *Deleted

## 2021-03-29 NOTE — Telephone Encounter (Signed)
   Telephone encounter was:  Successful.  03/29/2021 Name: Courtney Anderson MRN: [id number] DOB: 30-Jul-1956  Courtney Anderson is a 65 y.o. year old female who is a primary care patient of Vigg, Avanti, MD . The community resource team was consulted for assistance with Financial Difficulties related to rent  and utilities  Care guide performed the following interventions: Patient provided with information about care guide support team and interviewed to confirm resource needs.patient was sick with covid . was was turned down provided information on how to gain access to that information patient was grateful and did not need anything else  Follow Up Plan:  No further follow up planned at this time. The patient has been provided with needed resources.  [place], Care Management  [phone] [address] , [place] [address] Email : [email or web address]

## 2021-04-12 ENCOUNTER — Other Ambulatory Visit: Payer: Self-pay | Admitting: Internal Medicine

## 2021-05-03 ENCOUNTER — Ambulatory Visit (INDEPENDENT_AMBULATORY_CARE_PROVIDER_SITE_OTHER): Payer: Medicare Other | Admitting: Licensed Clinical Social Worker

## 2021-05-03 DIAGNOSIS — I10 Essential (primary) hypertension: Secondary | ICD-10-CM | POA: Diagnosis not present

## 2021-05-03 DIAGNOSIS — M159 Polyosteoarthritis, unspecified: Secondary | ICD-10-CM

## 2021-05-03 DIAGNOSIS — Z789 Other specified health status: Secondary | ICD-10-CM

## 2021-05-03 NOTE — Patient Instructions (Signed)
Visit Information   Goals Addressed             This Visit's Progress    COMPLETED: Find Help in My Community       Timeframe:  Short-Term Goal Priority:  High Start Date:    03/28/21                         Expected End Date: 05/03/21                     Follow Up Date 05/03/21   Patient Goals/Self-Care Activities: Over the next 45 days Schedule appointment with [facility] Counselors Attend all scheduled appointments with Providers I have place a referral to the care guide for community resources, they will follow up with you           Patient verbalizes understanding of instructions provided today and agrees to view in [place].   No further follow up required: Patient prefers to discontinue services with CCM LCSW. Agreed to continue follow up with PCP and CCM Pharmacist  Courtney Anderson, MSW, [email or web address] Phone [phone] 11:08 AM

## 2021-05-03 NOTE — Chronic Care Management (AMB) (Signed)
Chronic Care Management    Clinical Social Work Note  05/03/2021 Name: Courtney Anderson MRN: [id number] DOB: 1955/11/20  Courtney Anderson is a 65 y.o. year old female who is a primary care patient of Courtney Anderson, Avanti, MD. The CCM team was consulted to assist the patient with chronic disease management and/or care coordination needs related to: [employer] .   Engaged with patient by telephone for follow up visit in response to provider referral for social work chronic care management and care coordination services.   Consent to Services:  The patient was given information about Chronic Care Management services, agreed to services, and gave verbal consent prior to initiation of services.  Please see initial visit note for detailed documentation.   Patient agreed to services and consent obtained.   Assessment: Patient reports ongoing frustration with limited financial supports provided by DSS. CCM LCSW provided supportive resources to assist; however, patient reports that she is not interested in following up. States that she continues to receive support from son. See Care Plan below for interventions and patient self-care actives. Recommendation: Patient may benefit from, and is in agreement to continue following up with provider and CCM Pharmacist.  Follow up Plan: Patient does not require or desire continued follow-up. Will contact the office if needed  SDOH (Social Determinants of Health) assessments and interventions performed:    Advanced Directives Status: Not addressed in this encounter.  CCM Care Plan  Allergies  Allergen Reactions   Codeine     Outpatient Encounter Medications as of 05/03/2021  Medication Sig   amLODipine (NORVASC) 5 MG tablet Take 5 mg by mouth daily.   Cholecalciferol (VITAMIN D) 50 MCG (2000 UT) tablet Take 2,000 Units by mouth daily.   EUTHYROX 88 MCG tablet Take 88 mcg by mouth every morning.   Iron-Vitamin C 65-125 MG TABS Take 1 tablet by mouth in the  morning and at bedtime. Will recheck iron level next visit.   lisinopril (ZESTRIL) 40 MG tablet Take 1 tablet (40 mg total) by mouth daily.   metoprolol succinate (TOPROL-XL) 25 MG 24 hr tablet Take 1 tablet by mouth once daily   Multiple Vitamin (MULTIVITAMIN) tablet Take 1 tablet by mouth daily.   Multiple Vitamins-Minerals (ONE-A-DAY WOMENS 50+ ADVANTAGE) TABS Take 1 tablet by mouth daily.   No facility-administered encounter medications on file as of 05/03/2021.    Patient Active Problem List   Diagnosis Date Noted   Bradycardia 01/19/2021   Primary hyperparathyroidism (Courtney Anderson) 12/02/2020   Hypothyroidism 12/02/2020   Anemia 12/01/2020   History of 2019 novel coronavirus disease (COVID-19) 11/24/2020   Atrial fibrillation with RVR (Courtney Anderson) 07/24/2020   Vitamin D deficiency 10/17/2019   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   History of hyperthyroidism 08/20/2018   Multinodular goiter 08/20/2018   Hypertension 05/31/2015   Morbid obesity (Courtney Anderson) 05/31/2015   Elevated transaminase level 05/31/2015   Hyperlipidemia 05/31/2015    Conditions to be addressed/monitored:  Lacks knowledge of community resources  Care Plan : General Social Work (Adult)  Updates made by Christa See D, LCSW since 05/03/2021 12:00 AM     Problem: Barriers to Treatment Resolved 05/03/2021  Priority: Medium  Onset Date: 03/28/2021  Note:   Current barriers:    Financial constraints related to rental assistance, utility assistance, and healthcare premiums Clinical Goals: Patient will work with CCM LCSW to address needs related to financial strain Clinical Interventions:  Assessment of needs and barriers to care were conducted CCM LCSW contacted Courtney Anderson [phone] with the Courtney Anderson to inquire about available appointments for Courtney Anderson Anderson. Patient was informed that she can schedule next available appt for June 9, 22 at 1 PM, 2 PM, or 3 PM Contact information was provided and patient  agreed to schedule to discuss programs that may offer lower insurance premiums CCM LCSW discussed various local resources that can assist with food insecurity, rental, and utility assistance. Patient is not interested in MOW or [employer]. She agreed to allow CCM LCSW to complete care guide referral for rental and utility assistance Referral to care guide for community support and other options   Discussed plans with patient for ongoing care management follow up and provided patient with direct contact information for care management team Other interventions provided:Solution-Focused Strategies, Active listening / Reflection utilized , Emotional Supportive Provided, and Verbalization of feelings encouraged  Collaboration with Courtney Anderson, Avanti, MD regarding development and update of comprehensive plan of care as evidenced by provider attestation and co-signature Inter-disciplinary care team collaboration (see longitudinal plan of care) Patient Goals/Self-Care Activities: Over the next 45 days Schedule appointment with Courtney Anderson Attend all scheduled appointments with Providers I have place a referral to the care guide for community resources, they will follow up with you         Christa See, MSW, [email or web address] Phone [phone] 11:04 AM

## 2021-05-04 ENCOUNTER — Encounter: Payer: Medicare Other | Admitting: Obstetrics and Gynecology

## 2021-05-14 ENCOUNTER — Other Ambulatory Visit: Payer: Self-pay | Admitting: Internal Medicine

## 2021-05-24 ENCOUNTER — Encounter: Payer: Medicare Other | Admitting: Obstetrics and Gynecology

## 2021-05-24 ENCOUNTER — Encounter: Payer: Self-pay | Admitting: Obstetrics and Gynecology

## 2021-05-25 ENCOUNTER — Encounter: Payer: Medicare Other | Admitting: Obstetrics and Gynecology

## 2021-06-12 ENCOUNTER — Other Ambulatory Visit: Payer: Self-pay | Admitting: Internal Medicine

## 2021-06-12 MED ORDER — METOPROLOL SUCCINATE ER 25 MG PO TB24: 25.0000 mg | ORAL_TABLET | Freq: Every day | ORAL | 0 refills | Status: DC

## 2021-06-12 NOTE — Telephone Encounter (Signed)
Medication Refill - Medication: metoprolol succinate (TOPROL-XL) 25 MG 24 hr tablet   Has the patient contacted their pharmacy? Yes.   (Agent: If no, request that the patient contact the pharmacy for the refill.) (Agent: If yes, when and what did the pharmacy advise?)no refills   Preferred Pharmacy (with phone number or street name): [facility] ([facility] [place] [address] [place] ([place]) [place] [address]  Phone:  [phone]  Fax:  [phone]   Agent: Please be advised that RX refills may take up to 3 business days. We ask that you follow-up with your pharmacy.

## 2021-06-15 ENCOUNTER — Ambulatory Visit: Payer: Medicare Other | Admitting: Internal Medicine

## 2021-07-05 DIAGNOSIS — E213 Hyperparathyroidism, unspecified: Secondary | ICD-10-CM | POA: Diagnosis not present

## 2021-07-05 DIAGNOSIS — E042 Nontoxic multinodular goiter: Secondary | ICD-10-CM | POA: Diagnosis not present

## 2021-07-05 DIAGNOSIS — E559 Vitamin D deficiency, unspecified: Secondary | ICD-10-CM | POA: Diagnosis not present

## 2021-07-05 DIAGNOSIS — E890 Postprocedural hypothyroidism: Secondary | ICD-10-CM | POA: Diagnosis not present

## 2021-07-11 ENCOUNTER — Other Ambulatory Visit: Payer: Self-pay | Admitting: Internal Medicine

## 2021-07-11 NOTE — Telephone Encounter (Signed)
Medication already requested this am at 9:44 am.

## 2021-07-11 NOTE — Telephone Encounter (Signed)
Next OV 07/20/21  Approved per protocol  Requested Prescriptions  Pending Prescriptions Disp Refills  . metoprolol succinate (TOPROL-XL) 25 MG 24 hr tablet [Pharmacy Med Name: Metoprolol Succinate ER 25 MG Oral Tablet Extended Release 24 Hour] 30 tablet 0    Sig: Take 1 tablet by mouth once daily     Cardiovascular:  Beta Blockers Passed - 07/11/2021  7:58 AM      Passed - Last BP in normal range    BP Readings from Last 1 Encounters:  03/23/21 131/70         Passed - Last Heart Rate in normal range    Pulse Readings from Last 1 Encounters:  03/23/21 (!) 13         Passed - Valid encounter within last 6 months    Recent Outpatient Visits          3 months ago Hypothyroidism, unspecified type   [facility] Vigg, Avanti, MD   5 months ago Hyperlipidemia, unspecified hyperlipidemia type   [facility] Vigg, Avanti, MD   6 months ago Acquired hypothyroidism   [facility] Vigg, Avanti, MD   7 months ago Atrial fibrillation with RVR ([place])   [place], Jolene T, NP   8 months ago Acute hypoxemic respiratory failure due to COVID-19 [facility])   [place], Barbaraann Faster, NP      Future Appointments            In 1 week Vigg, Avanti, MD [facility]

## 2021-07-11 NOTE — Telephone Encounter (Signed)
Medication Refill - Medication:  metoprolol succinate (TOPROL-XL) 25 MG 24 hr tablet  Has the patient contacted their pharmacy? No.  Preferred Pharmacy (with phone number or street name):  [facility] [place] [facility] [place] Phone:  [phone]  Fax:  [phone]      Agent: Please be advised that RX refills may take up to 3 business days. We ask that you follow-up with your pharmacy.

## 2021-07-13 DIAGNOSIS — E890 Postprocedural hypothyroidism: Secondary | ICD-10-CM | POA: Diagnosis not present

## 2021-07-13 DIAGNOSIS — E042 Nontoxic multinodular goiter: Secondary | ICD-10-CM | POA: Diagnosis not present

## 2021-07-13 DIAGNOSIS — E213 Hyperparathyroidism, unspecified: Secondary | ICD-10-CM | POA: Diagnosis not present

## 2021-07-13 DIAGNOSIS — E559 Vitamin D deficiency, unspecified: Secondary | ICD-10-CM | POA: Diagnosis not present

## 2021-07-20 ENCOUNTER — Other Ambulatory Visit: Payer: Self-pay

## 2021-07-20 ENCOUNTER — Ambulatory Visit (INDEPENDENT_AMBULATORY_CARE_PROVIDER_SITE_OTHER): Payer: Medicare Other | Admitting: Internal Medicine

## 2021-07-20 ENCOUNTER — Encounter: Payer: Self-pay | Admitting: Internal Medicine

## 2021-07-20 VITALS — BP 149/66 | HR 50 | Temp 98.2°F | Ht 64.8 in | Wt 228.8 lb

## 2021-07-20 DIAGNOSIS — E039 Hypothyroidism, unspecified: Secondary | ICD-10-CM

## 2021-07-20 DIAGNOSIS — Z23 Encounter for immunization: Secondary | ICD-10-CM

## 2021-07-20 DIAGNOSIS — I10 Essential (primary) hypertension: Secondary | ICD-10-CM

## 2021-07-20 DIAGNOSIS — E042 Nontoxic multinodular goiter: Secondary | ICD-10-CM

## 2021-07-20 NOTE — Progress Notes (Signed)
BP (!) 149/66   Pulse (!) 50   Temp 98.2 F (36.8 C) (Oral)   Ht 5' 4.8" (1.646 m)   Wt 228 lb 12.8 oz (103.8 kg)   SpO2 97%   BMI 38.31 kg/m    Subjective:    Patient ID: Courtney Anderson, female    DOB: 01/30/1956, 65 y.o.   MRN: [id number]  Chief Complaint  Patient presents with  . Hyperlipidemia  . Hypertension  . Hypothyroidism    HPI: Courtney Anderson is a 65 y.o. female  Hyperlipidemia  Hypertension This is a chronic problem. The current episode started more than 1 year ago. The problem is controlled. Pertinent negatives include no palpitations. Identifiable causes of hypertension include a thyroid problem.  Thyroid Problem Presents for follow-up (sees [facility]ocr[facility]  Dr. Honor Junes.) visit. Patient reports no anxiety, cold intolerance, constipation, depressed mood, hoarse voice, leg swelling, menstrual problem, nail problem or palpitations. Her past medical history is significant for hyperlipidemia.   Chief Complaint  Patient presents with  . Hyperlipidemia  . Hypertension  . Hypothyroidism    Relevant past medical, surgical, family and social history reviewed and updated as indicated. Interim medical history since our last visit reviewed. Allergies and medications reviewed and updated.  Review of Systems  HENT:  Negative for hoarse voice.   Cardiovascular:  Negative for palpitations.  Gastrointestinal:  Negative for constipation.  Endocrine: Negative for cold intolerance.  Genitourinary:  Negative for menstrual problem.  Psychiatric/Behavioral:  The patient is not nervous/anxious.    Per HPI unless specifically indicated above     Objective:    BP (!) 149/66   Pulse (!) 50   Temp 98.2 F (36.8 C) (Oral)   Ht 5' 4.8" (1.646 m)   Wt 228 lb 12.8 oz (103.8 kg)   SpO2 97%   BMI 38.31 kg/m   Wt Readings from Last 3 Encounters:  07/20/21 228 lb 12.8 oz (103.8 kg)  03/23/21 229 lb (103.9 kg)  02/02/21 223 lb 6.4 oz (101.3 kg)     Physical Exam Vitals and nursing note reviewed.  Constitutional:      General: She is not in acute distress.    Appearance: Normal appearance. She is not ill-appearing or diaphoretic.  Eyes:     Conjunctiva/sclera: Conjunctivae normal.  Pulmonary:     Breath sounds: No rhonchi.  Abdominal:     General: Abdomen is flat. Bowel sounds are normal. There is no distension.     Palpations: Abdomen is soft. There is no mass.     Tenderness: There is no abdominal tenderness. There is no guarding.  Musculoskeletal:        General: No swelling, tenderness or signs of injury.     Right lower leg: No edema.     Left lower leg: No edema.  Skin:    General: Skin is warm and dry.     Coloration: Skin is not jaundiced.     Findings: No erythema.  Neurological:     General: No focal deficit present.     Mental Status: She is alert.  Psychiatric:        Mood and Affect: Mood normal.    Results for orders placed or performed in visit on 03/23/21  TSH  Result Value Ref Range   TSH 11.600 (H) 0.450 - 4.500 uIU/mL  Comprehensive metabolic panel  Result Value Ref Range   Glucose 89 65 - 99 mg/dL   BUN 15 8 - 27 mg/dL  Creatinine, Ser 0.93 0.57 - 1.00 mg/dL   eGFR 68 >59 mL/min/1.73   BUN/Creatinine Ratio 16 12 - 28   Sodium 140 134 - 144 mmol/L   Potassium 3.7 3.5 - 5.2 mmol/L   Chloride 99 96 - 106 mmol/L   CO2 23 20 - 29 mmol/L   Calcium 10.1 8.7 - 10.3 mg/dL   Total Protein 7.6 6.0 - 8.5 g/dL   Albumin 4.5 3.8 - 4.8 g/dL   Globulin, Total 3.1 1.5 - 4.5 g/dL   Albumin/Globulin Ratio 1.5 1.2 - 2.2   Bilirubin Total 0.2 0.0 - 1.2 mg/dL   Alkaline Phosphatase 113 44 - 121 IU/L   AST 19 0 - 40 IU/L   ALT 14 0 - 32 IU/L  Lipid panel  Result Value Ref Range   Cholesterol, Total 207 (H) 100 - 199 mg/dL   Triglycerides 90 0 - 149 mg/dL   HDL 51 >39 mg/dL   VLDL Cholesterol Cal 16 5 - 40 mg/dL   LDL Chol Calc (NIH) 140 (H) 0 - 99 mg/dL   Chol/HDL Ratio 4.1 0.0 - 4.4 ratio         Current Outpatient Medications:  .  amLODipine (NORVASC) 5 MG tablet, Take 5 mg by mouth daily., Disp: , Rfl:  .  Cholecalciferol (VITAMIN D) 50 MCG (2000 UT) tablet, Take 2,000 Units by mouth daily., Disp: , Rfl:  .  EUTHYROX 88 MCG tablet, Take 88 mcg by mouth every morning., Disp: , Rfl:  .  Iron-Vitamin C 65-125 MG TABS, Take 1 tablet by mouth in the morning and at bedtime. Will recheck iron level next visit., Disp: 60 tablet, Rfl: 4 .  lisinopril (ZESTRIL) 40 MG tablet, Take 1 tablet (40 mg total) by mouth daily., Disp: 30 tablet, Rfl: 6 .  metoprolol succinate (TOPROL-XL) 25 MG 24 hr tablet, Take 1 tablet by mouth once daily, Disp: 30 tablet, Rfl: 0 .  Multiple Vitamins-Minerals (ONE-A-DAY WOMENS 50+ ADVANTAGE) TABS, Take 1 tablet by mouth daily., Disp: , Rfl:     Assessment & Plan:  Hypothyroidism s/p radioactive ablation x  2019  Is on 88 mcg of euthyroxine.   Ref. Range 01/12/2021 09:33 03/23/2021 09:53  TSH Latest Ref Range: 0.450 - 4.500 uIU/mL 49.300 (H) 11.600 (H)    2. Multinodular goiter : to have an FNAC x 2-3 weeks.  07/05/21: Neck ultrasound showed left upper nodule to measure 2.3x1.4x1.8 cm (1.4 x 1.1 x 1.5 cm in 11/19). 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 Lower: 2.4x1.4x2.2 cm (3.5 x 1.8 x 2.8 cm in 11/19).  New sub cm nodules seen in left isthmus and right lower lobe.   3. HTN is on norvasc and lisinopril and toprol xl $RemoveB'25mg'[id number]$    Continue current meds.  Medication compliance emphasised. pt advised to keep Bp logs. Pt verbalised understanding of the same. Pt to have a low salt diet . Exercise to reach a goal of at least 150 mins a week.  lifestyle modifications explained and pt understands importance of the above.   Problem List Items Addressed This Visit   None Visit Diagnoses     Need for influenza vaccination    -  Primary   Relevant Orders   Flu Vaccine QUAD  High Dose(Fluad) (Completed)        Orders Placed This Encounter  Procedures  . Flu Vaccine QUAD High  Dose(Fluad)     No orders of the defined types were placed in this encounter.    Follow up plan: No follow-ups on file.  CBC, CMP, FLP,TSH Labs 1 week prior to next visit.

## 2021-07-21 ENCOUNTER — Other Ambulatory Visit: Payer: Self-pay

## 2021-07-21 DIAGNOSIS — E782 Mixed hyperlipidemia: Secondary | ICD-10-CM

## 2021-07-21 DIAGNOSIS — I10 Essential (primary) hypertension: Secondary | ICD-10-CM

## 2021-07-21 DIAGNOSIS — E039 Hypothyroidism, unspecified: Secondary | ICD-10-CM

## 2021-07-27 ENCOUNTER — Telehealth: Payer: Self-pay

## 2021-07-27 NOTE — Chronic Care Management (AMB) (Signed)
    Chronic Care Management Pharmacy Assistant   Name: Courtney Anderson  MRN: [id number] DOB: August 02, 1956   Reason for Encounter: Disease State General assessment call    Recent office visits:  07/20/21 Charlynne Cousins MD - Seen for influenza vaccination - No medication changes noted - Follow up in 3 months  03/23/21 Charlynne Cousins MD - Seen for Hypothyroidism - Labs ordered - increase lisinopril 40 mg - No follow up noted   Recent consult visits:  07/13/21 Toni Arthurs - Endocrinology - Seen for postablative hypothyroidism - Per provider notes: Increase Vitamin D (patient taking OTC, unsure of dosage bu thinks it's 2,000 units) - Follow up in 6 months  03/16/21 Flossie Dibble MD - Cardiology - Seen for bradycardia - Discontinued pantoprazole (PROTONIX) 40 MG DR tablet - Discontinued metoprolol succinate (TOPROL-XL) 25 MG XL tablet - Follow up in 6 months   Hospital visits:  None in previous 6 months  Medications: Outpatient Encounter Medications as of 07/27/2021  Medication Sig   amLODipine (NORVASC) 5 MG tablet Take 5 mg by mouth daily.   Cholecalciferol (VITAMIN D) 50 MCG (2000 UT) tablet Take 2,000 Units by mouth daily.   EUTHYROX 88 MCG tablet Take 88 mcg by mouth every morning.   Iron-Vitamin C 65-125 MG TABS Take 1 tablet by mouth in the morning and at bedtime. Will recheck iron level next visit.   lisinopril (ZESTRIL) 40 MG tablet Take 1 tablet (40 mg total) by mouth daily.   metoprolol succinate (TOPROL-XL) 25 MG 24 hr tablet Take 1 tablet by mouth once daily   Multiple Vitamins-Minerals (ONE-A-DAY WOMENS 50+ ADVANTAGE) TABS Take 1 tablet by mouth daily.   No facility-administered encounter medications on file as of 07/27/2021.    Care Gaps: None   Have you had any problems recently with your health? Patient states that she has no concerns at this time.   Have you had any problems with your pharmacy? Patient has no concerns with pharmacy.   What issues or side effects  are you having with your medications? Patient states there are no side effects at this time   What would you like me to pass along to [facility] for them to help you with?  Patient has nothing to report at this time.   What can we do to take care of you better?  N/a  Misc. Comment: Courtney Anderson is a very pleasant woman. She states that she is still taking metoprolol medication, she is doing well at the moment, and that she appreciates the call. Scheduled an appointment with [name] on November 12, 2021 @ 9:30 am.    Star Rating Drugs: Lisinopril 40 mg last fill 02/02/21 30 DS   Andee Poles, CMA

## 2021-08-06 ENCOUNTER — Ambulatory Visit (INDEPENDENT_AMBULATORY_CARE_PROVIDER_SITE_OTHER): Payer: Medicare Other

## 2021-08-06 VITALS — Ht 65.0 in | Wt 200.0 lb

## 2021-08-06 DIAGNOSIS — Z0000 Encounter for general adult medical examination without abnormal findings: Secondary | ICD-10-CM | POA: Diagnosis not present

## 2021-08-06 NOTE — Progress Notes (Signed)
I connected with Courtney Anderson today by telephone and verified that I am speaking with the correct person using two identifiers. Location patient: home Location provider: work Persons participating in the virtual visit: Margert, Edsall LPN.   I discussed the limitations, risks, security and privacy concerns of performing an evaluation and management service by telephone and the availability of in person appointments. I also discussed with the patient that there may be a patient responsible charge related to this service. The patient expressed understanding and verbally consented to this telephonic visit.    Interactive audio and video telecommunications were attempted between this provider and patient, however failed, due to patient having technical difficulties OR patient did not have access to video capability.  We continued and completed visit with audio only.     Vital signs may be patient reported or missing.  Subjective:   Courtney Anderson is a 65 y.o. female who presents for Medicare Annual (Subsequent) preventive examination.  Review of Systems     Cardiac Risk Factors include: advanced age (>48men, >71 women);hypertension;obesity (BMI >30kg/m2)     Objective:    Today's Vitals   08/06/21 0811  Weight: 200 lb (90.7 kg)  Height: 5\' 5"  (1.651 m)   Body mass index is 33.28 kg/m.  Advanced Directives 08/06/2021 10/29/2020 10/27/2020 10/27/2020 07/24/2020  Does Patient Have a Medical Advance Directive? No No - Yes No  Type of Advance Directive - - Healthcare Power of [place] in Chart? - - No - copy requested - -  Would patient like information on creating a medical advance directive? - No - Patient declined - - No - Patient declined    Current Medications (verified) Outpatient Encounter Medications as of 08/06/2021  Medication Sig   amLODipine (NORVASC) 5 MG tablet Take 5 mg by mouth daily.    Cholecalciferol (VITAMIN D) 50 MCG (2000 UT) tablet Take 2,000 Units by mouth daily.   EUTHYROX 88 MCG tablet Take 88 mcg by mouth every morning.   Iron-Vitamin C 65-125 MG TABS Take 1 tablet by mouth in the morning and at bedtime. Will recheck iron level next visit.   lisinopril (ZESTRIL) 40 MG tablet Take 1 tablet (40 mg total) by mouth daily.   metoprolol succinate (TOPROL-XL) 25 MG 24 hr tablet Take 1 tablet by mouth once daily   Multiple Vitamins-Minerals (ONE-A-DAY WOMENS 50+ ADVANTAGE) TABS Take 1 tablet by mouth daily.   No facility-administered encounter medications on file as of 08/06/2021.    Allergies (verified) Codeine   History: Past Medical History:  Diagnosis Date   Allergy    COVID-19    Elevated transaminase level    Hyperlipidemia    Hypertension    Obesity    Past Surgical History:  Procedure Laterality Date   BREAST SURGERY     COLONOSCOPY WITH PROPOFOL N/A 10/29/2020   Procedure: COLONOSCOPY WITH PROPOFOL;  Surgeon: Lin Landsman, MD;  Location: [facility] ENDOSCOPY;  Service: Gastroenterology;  Laterality: N/A;   ECTOPIC PREGNANCY SURGERY     ESOPHAGOGASTRODUODENOSCOPY (EGD) WITH PROPOFOL N/A 10/29/2020   Procedure: ESOPHAGOGASTRODUODENOSCOPY (EGD) WITH PROPOFOL;  Surgeon: Lin Landsman, MD;  Location: [facility];  Service: Gastroenterology;  Laterality: N/A;   Family History  Problem Relation Age of Onset   Hypertension Mother    Arthritis Mother    Hypertension Father    Stroke Father    Aneurysm Sister    Hypertension Sister  Hypertension Sister    Hypertension Brother    Hypertension Brother    Social History   Socioeconomic History   Marital status: Single    Spouse name: Not on file   Number of children: Not on file   Years of education: Not on file   Highest education level: Not on file  Occupational History   Not on file  Tobacco Use   Smoking status: Former   Smokeless tobacco: Never  Vaping Use   Vaping Use: Never  used  Substance and Sexual Activity   Alcohol use: Not Currently   Drug use: No   Sexual activity: Not Currently  Other Topics Concern   Not on file  Social History Narrative   Not on file   Social Determinants of Health   Financial Resource Strain: Medium Risk   Difficulty of Paying Living Expenses: Somewhat hard  Food Insecurity: No Food Insecurity   Worried About Charity fundraiser in the Last Year: Never true   Ran Out of Food in the Last Year: Never true  Transportation Needs: No Transportation Needs   Lack of Transportation (Medical): No   Lack of Transportation (Non-Medical): No  Physical Activity: Sufficiently Active   Days of Exercise per Week: 5 days   Minutes of Exercise per Session: 60 min  Stress: No Stress Concern Present   Feeling of Stress : Not at all  Social Connections: Not on file    Tobacco Counseling Counseling given: Not Answered   Clinical Intake:  Pre-visit preparation completed: Yes  Pain : No/denies pain     Nutritional Status: BMI > 30  Obese Nutritional Risks: None Diabetes: No  How often do you need to have someone help you when you read instructions, pamphlets, or other written materials from your doctor or pharmacy?: 1 - Never What is the last grade level you completed in school?: 12th grade  Diabetic? no  Interpreter Needed?: No  Information entered by :: NAllen LPN   Activities of Daily Living In your present state of health, do you have any difficulty performing the following activities: 08/06/2021 10/30/2020  Hearing? N -  Vision? N -  Comment - -  Difficulty concentrating or making decisions? N -  Walking or climbing stairs? N -  Dressing or bathing? N -  Doing errands, shopping? N Y  Conservation officer, nature and eating ? N -  Using the Toilet? N -  In the past six months, have you accidently leaked urine? N -  Do you have problems with loss of bowel control? N -  Managing your Medications? N -  Managing your Finances? N -   Housekeeping or managing your Housekeeping? N -  Some recent data might be hidden    Patient Care Team: Charlynne Cousins, MD as PCP - General Vladimir Faster, [facility] (Pharmacist)  Indicate any recent Medical Services you may have received from other than [facility] providers in the past year (date may be approximate).     Assessment:   This is a routine wellness examination for Noemi.  Hearing/Vision screen Vision Screening - Comments:: Regular eye exams, [facility]  Dietary issues and exercise activities discussed: Current Exercise Habits: Home exercise routine, Type of exercise: treadmill;strength training/weights, Time (Minutes): 60   Goals Addressed             This Visit's Progress    Patient Stated       08/06/2021, continue to get better       Depression  Screen PHQ 2/9 Scores 08/06/2021 03/23/2021 01/12/2021 12/01/2020 10/25/2020 10/22/2019 02/02/2019  PHQ - 2 Score 0 0 0 0 0 0 0  PHQ- 9 Score - 0 - - - - -    Fall Risk Fall Risk  08/06/2021 03/23/2021 12/01/2020 10/25/2020 10/22/2019  Falls in the past year? 0 0 0 0 0  Number falls in past yr: - 0 - 0 0  Injury with Fall? - 0 - 0 0  Risk for fall due to : Medication side effect;Impaired balance/gait - - - -  Follow up Falls evaluation completed;Education provided;Falls prevention discussed Falls evaluation completed - - Falls evaluation completed    FALL RISK PREVENTION PERTAINING TO THE HOME:  Any stairs in or around the home? Yes  If so, are there any without handrails? No  Home free of loose throw rugs in walkways, pet beds, electrical cords, etc? Yes  Adequate lighting in your home to reduce risk of falls? Yes   ASSISTIVE DEVICES UTILIZED TO PREVENT FALLS:  Life alert? No  Use of a cane, walker or w/c? No  Grab bars in the bathroom? No  Shower chair or bench in shower? Yes  Elevated toilet seat or a handicapped toilet? No   TIMED UP AND GO:  Was the test performed? No .      Cognitive Function:      6CIT Screen 08/06/2021  What Year? 0 points  What month? 0 points  What time? 0 points  Count back from 20 0 points  Months in reverse 0 points  Repeat phrase 4 points  Total Score 4    Immunizations Immunization History  Administered Date(s) Administered   Fluad Quad(high Dose 65+) 07/20/2021   Influenza,inj,Quad PF,6+ Mos 12/19/2016, 08/29/2017, 10/22/2019   PFIZER(Purple Top)SARS-COV-2 Vaccination 12/15/2020, 01/05/2021   Pneumococcal Conjugate-13 03/23/2021   Td 08/24/2007    TDAP status: Due, Education has been provided regarding the importance of this vaccine. Advised may receive this vaccine at local pharmacy or Health Dept. Aware to provide a copy of the vaccination record if obtained from local pharmacy or Health Dept. Verbalized acceptance and understanding.  Flu Vaccine status: Up to date  Pneumococcal vaccine status: Up to date  Covid-19 vaccine status: Completed vaccines  Qualifies for Shingles Vaccine? Yes   Zostavax completed No   Shingrix Completed?: No.    Education has been provided regarding the importance of this vaccine. Patient has been advised to call insurance company to determine out of pocket expense if they have not yet received this vaccine. Advised may also receive vaccine at local pharmacy or Health Dept. Verbalized acceptance and understanding.  Screening Tests Health Maintenance  Topic Date Due   COVID-19 Vaccine (3 - Booster for Pfizer series) 06/07/2021   Zoster Vaccines- Shingrix (1 of 2) 10/19/2021 (Originally 07/24/2005)   MAMMOGRAM  10/25/2021 (Originally 10/27/2010)   DEXA SCAN  10/25/2021 (Originally 07/24/2020)   TETANUS/TDAP  12/01/2021 (Originally 08/23/2017)   COLONOSCOPY (Pts 45-24yrs Insurance coverage will need to be confirmed)  10/29/2030   INFLUENZA VACCINE  Completed   Hepatitis C Screening  Completed   HPV VACCINES  Aged Out    Health Maintenance  Health Maintenance Due  Topic Date Due   COVID-19 Vaccine (3 - Booster  for [place] series) 06/07/2021    Colorectal cancer screening: Type of screening: Colonoscopy. Completed 10/29/2020. Repeat every 10 years  Mammogram status: patient to schedule  Bone Density status: patient to schedule  Lung Cancer Screening: (Low Dose CT Chest recommended  if Age 44-80 years, 41 pack-year currently smoking OR have quit w/in 15years.) does not qualify.   Lung Cancer Screening Referral: no  Additional Screening:  Hepatitis C Screening: does qualify; Completed 12/19/2016  Vision Screening: Recommended annual ophthalmology exams for early detection of glaucoma and other disorders of the eye. Is the patient up to date with their annual eye exam?  Yes  Who is the provider or what is the name of the office in which the patient attends annual eye exams? [facility] If pt is not established with a provider, would they like to be referred to a provider to establish care? No .   Dental Screening: Recommended annual dental exams for proper oral hygiene  Community Resource Referral / Chronic Care Management: CRR required this visit?  No   CCM required this visit?  No      Plan:     I have personally reviewed and noted the following in the patient's chart:   Medical and social history Use of alcohol, tobacco or illicit drugs  Current medications and supplements including opioid prescriptions.  Functional ability and status Nutritional status Physical activity Advanced directives List of other physicians Hospitalizations, surgeries, and ER visits in previous 12 months Vitals Screenings to include cognitive, depression, and falls Referrals and appointments  In addition, I have reviewed and discussed with patient certain preventive protocols, quality metrics, and best practice recommendations. A written personalized care plan for preventive services as well as general preventive health recommendations were provided to patient.     Kellie Simmering,  LPN   90/01/91   Nurse Notes:

## 2021-08-06 NOTE — Patient Instructions (Signed)
[name] , Thank you for taking time to come for your Medicare Wellness Visit. I appreciate your ongoing commitment to your health goals. Please review the following plan we discussed and let me know if I can assist you in the future.   Screening recommendations/referrals: Colonoscopy: completed 10/29/2020 Mammogram: patient to schedule Bone Density: patient to schedule Recommended yearly ophthalmology/optometry visit for glaucoma screening and checkup Recommended yearly dental visit for hygiene and checkup  Vaccinations: Influenza vaccine: completed 07/20/2021 Pneumococcal vaccine: completed 03/23/2021 Tdap vaccine: due Shingles vaccine: decline   Covid-19: 01/05/2021, 12/15/2020  Advanced directives: Advance directive discussed with you today.   Conditions/risks identified: none  Next appointment: Follow up in one year for your annual wellness visit    Preventive Care 66 Years and Older, Female Preventive care refers to lifestyle choices and visits with your health care provider that can promote health and wellness. What does preventive care include? A yearly physical exam. This is also called an annual well check. Dental exams once or twice a year. Routine eye exams. Ask your health care provider how often you should have your eyes checked. Personal lifestyle choices, including: Daily care of your teeth and gums. Regular physical activity. Eating a healthy diet. Avoiding tobacco and drug use. Limiting alcohol use. Practicing safe sex. Taking low-dose aspirin every day. Taking vitamin and mineral supplements as recommended by your health care provider. What happens during an annual well check? The services and screenings done by your health care provider during your annual well check will depend on your age, overall health, lifestyle risk factors, and family history of disease. Counseling  Your health care provider may ask you questions about your: Alcohol use. Tobacco  use. Drug use. Emotional well-being. Home and relationship well-being. Sexual activity. Eating habits. History of falls. Memory and ability to understand (cognition). Work and work Statistician. Reproductive health. Screening  You may have the following tests or measurements: Height, weight, and BMI. Blood pressure. Lipid and cholesterol levels. These may be checked every 5 years, or more frequently if you are over 24 years old. Skin check. Lung cancer screening. You may have this screening every year starting at age 82 if you have a 30-pack-year history of smoking and currently smoke or have quit within the past 15 years. Fecal occult blood test (FOBT) of the stool. You may have this test every year starting at age 59. Flexible sigmoidoscopy or colonoscopy. You may have a sigmoidoscopy every 5 years or a colonoscopy every 10 years starting at age 27. Hepatitis C blood test. Hepatitis B blood test. Sexually transmitted disease (STD) testing. Diabetes screening. This is done by checking your blood sugar (glucose) after you have not eaten for a while (fasting). You may have this done every 1-3 years. Bone density scan. This is done to screen for osteoporosis. You may have this done starting at age 49. Mammogram. This may be done every 1-2 years. Talk to your health care provider about how often you should have regular mammograms. Talk with your health care provider about your test results, treatment options, and if necessary, the need for more tests. Vaccines  Your health care provider may recommend certain vaccines, such as: Influenza vaccine. This is recommended every year. Tetanus, diphtheria, and acellular pertussis (Tdap, Td) vaccine. You may need a Td booster every 10 years. Zoster vaccine. You may need this after age 40. Pneumococcal 13-valent conjugate (PCV13) vaccine. One dose is recommended after age 25. Pneumococcal polysaccharide (PPSV23) vaccine. One dose is recommended  after age 80. Talk to your health care provider about which screenings and vaccines you need and how often you need them. This information is not intended to replace advice given to you by your health care provider. Make sure you discuss any questions you have with your health care provider. Document Released: 11/17/2015 Document Revised: 07/10/2016 Document Reviewed: 08/22/2015 Elsevier Interactive Patient Education  2017 [employer] Prevention in the Home Falls can cause injuries. They can happen to people of all ages. There are many things you can do to make your home safe and to help prevent falls. What can I do on the outside of my home? Regularly fix the edges of walkways and driveways and fix any cracks. Remove anything that might make you trip as you walk through a door, such as a raised step or threshold. Trim any bushes or trees on the path to your home. Use bright outdoor lighting. Clear any walking paths of anything that might make someone trip, such as rocks or tools. Regularly check to see if handrails are loose or broken. Make sure that both sides of any steps have handrails. Any raised decks and porches should have guardrails on the edges. Have any leaves, snow, or ice cleared regularly. Use sand or salt on walking paths during winter. Clean up any spills in your garage right away. This includes oil or grease spills. What can I do in the bathroom? Use night lights. Install grab bars by the toilet and in the tub and shower. Do not use towel bars as grab bars. Use non-skid mats or decals in the tub or shower. If you need to sit down in the shower, use a plastic, non-slip stool. Keep the floor dry. Clean up any water that spills on the floor as soon as it happens. Remove soap buildup in the tub or shower regularly. Attach bath mats securely with double-sided non-slip rug tape. Do not have throw rugs and other things on the floor that can make you trip. What can I do  in the bedroom? Use night lights. Make sure that you have a light by your bed that is easy to reach. Do not use any sheets or blankets that are too big for your bed. They should not hang down onto the floor. Have a firm chair that has side arms. You can use this for support while you get dressed. Do not have throw rugs and other things on the floor that can make you trip. What can I do in the kitchen? Clean up any spills right away. Avoid walking on wet floors. Keep items that you use a lot in easy-to-reach places. If you need to reach something above you, use a strong step stool that has a grab bar. Keep electrical cords out of the way. Do not use floor polish or wax that makes floors slippery. If you must use wax, use non-skid floor wax. Do not have throw rugs and other things on the floor that can make you trip. What can I do with my stairs? Do not leave any items on the stairs. Make sure that there are handrails on both sides of the stairs and use them. Fix handrails that are broken or loose. Make sure that handrails are as long as the stairways. Check any carpeting to make sure that it is firmly attached to the stairs. Fix any carpet that is loose or worn. Avoid having throw rugs at the top or bottom of the stairs. If you do  have throw rugs, attach them to the floor with carpet tape. Make sure that you have a light switch at the top of the stairs and the bottom of the stairs. If you do not have them, ask someone to add them for you. What else can I do to help prevent falls? Wear shoes that: Do not have high heels. Have rubber bottoms. Are comfortable and fit you well. Are closed at the toe. Do not wear sandals. If you use a stepladder: Make sure that it is fully opened. Do not climb a closed stepladder. Make sure that both sides of the stepladder are locked into place. Ask someone to hold it for you, if possible. Clearly mark and make sure that you can see: Any grab bars or  handrails. First and last steps. Where the edge of each step is. Use tools that help you move around (mobility aids) if they are needed. These include: Canes. Walkers. Scooters. Crutches. Turn on the lights when you go into a dark area. Replace any light bulbs as soon as they burn out. Set up your furniture so you have a clear path. Avoid moving your furniture around. If any of your floors are uneven, fix them. If there are any pets around you, be aware of where they are. Review your medicines with your doctor. Some medicines can make you feel dizzy. This can increase your chance of falling. Ask your doctor what other things that you can do to help prevent falls. This information is not intended to replace advice given to you by your health care provider. Make sure you discuss any questions you have with your health care provider. Document Released: 08/17/2009 Document Revised: 03/28/2016 Document Reviewed: 11/25/2014 Elsevier Interactive Patient Education  2017 [employer].

## 2021-08-09 ENCOUNTER — Telehealth: Payer: Self-pay

## 2021-08-09 NOTE — Chronic Care Management (AMB) (Signed)
    Chronic Care Management Pharmacy Assistant   Name: [name]  MRN: [id number] DOB: 23-Jul-1956  Courtney Anderson is an 65 y.o. year old female who presents for his follow-up CCM visit with the clinical pharmacist.  Reason for Encounter: Disease State General   Recent office visits:  07/20/21 Charlynne Cousins MD- pt was seen to receive flu shot. Pt received high dose influenza vaccine. No follow up noted.  Recent consult visits:  07/05/21 Toni Arthurs- pt was seen for Ancillary procedure of the Multinodular goiter.  Hospital visits:  None in previous 6 months  Medications: Outpatient Encounter Medications as of 08/09/2021  Medication Sig   amLODipine (NORVASC) 5 MG tablet Take 5 mg by mouth daily.   Cholecalciferol (VITAMIN D) 50 MCG (2000 UT) tablet Take 2,000 Units by mouth daily.   EUTHYROX 88 MCG tablet Take 88 mcg by mouth every morning.   Iron-Vitamin C 65-125 MG TABS Take 1 tablet by mouth in the morning and at bedtime. Will recheck iron level next visit.   lisinopril (ZESTRIL) 40 MG tablet Take 1 tablet (40 mg total) by mouth daily.   metoprolol succinate (TOPROL-XL) 25 MG 24 hr tablet Take 1 tablet by mouth once daily   Multiple Vitamins-Minerals (ONE-A-DAY WOMENS 50+ ADVANTAGE) TABS Take 1 tablet by mouth daily.   No facility-administered encounter medications on file as of 08/09/2021.    Have you had any problems recently with your health? Pt stated she is not having any problems with her health at the moment.  Have you had any problems with your pharmacy? Pt stated she is not having any trouble with her pharmacy.  What issues or side effects are you having with your medications? Pt stated she is unaware of any side effects.  What would you like me to pass along to Edison Nasuti Potts,CPP for them to help you with?  Pt is wondering if CPP can get a 90 DS of her BP meds because she says going once a month to pick them up is a "Headache"  What can we do to take care  of you better? Pt is wanting a 90 DS of her BP meds because she says going once a month to pick them up is a "Headache"  Star Rating Drugs:  lisinopril (ZESTRIL) 20 MG tablet last fill 08/02/2021 30 DS  [place] Clinical Pharmacist Assistant [phone]

## 2021-08-12 ENCOUNTER — Other Ambulatory Visit: Payer: Self-pay | Admitting: Internal Medicine

## 2021-08-12 NOTE — Telephone Encounter (Signed)
Requested Prescriptions  Pending Prescriptions Disp Refills  . metoprolol succinate (TOPROL-XL) 25 MG 24 hr tablet [Pharmacy Med Name: Metoprolol Succinate ER 25 MG Oral Tablet Extended Release 24 Hour] 30 tablet 0    Sig: Take 1 tablet by mouth once daily     Cardiovascular:  Beta Blockers Failed - 08/12/2021  1:02 PM      Failed - Last BP in normal range    BP Readings from Last 1 Encounters:  07/20/21 (!) 149/66         Passed - Last Heart Rate in normal range    Pulse Readings from Last 1 Encounters:  07/20/21 (!) 50         Passed - Valid encounter within last 6 months    Recent Outpatient Visits          3 weeks ago Need for influenza vaccination   [facility] Vigg, Avanti, MD   4 months ago Hypothyroidism, unspecified type   [facility] Vigg, Avanti, MD   6 months ago Hyperlipidemia, unspecified hyperlipidemia type   [facility] Vigg, Avanti, MD   7 months ago Acquired hypothyroidism   [place] Vigg, Avanti, MD   8 months ago Atrial fibrillation with RVR ([place])   [place], Barbaraann Faster, NP      Future Appointments            In 3 months Vigg, Avanti, MD [facility], [facility]   In 80 months  [facility], [place]

## 2021-08-13 NOTE — Addendum Note (Signed)
Addended by: [name] on: 08/13/2021 03:20 PM   Modules accepted: Orders

## 2021-09-01 ENCOUNTER — Other Ambulatory Visit: Payer: Self-pay | Admitting: Internal Medicine

## 2021-09-01 NOTE — Telephone Encounter (Signed)
Requested Prescriptions  Pending Prescriptions Disp Refills  . lisinopril (ZESTRIL) 40 MG tablet [Pharmacy Med Name: Lisinopril 40 MG Oral Tablet] 90 tablet 0    Sig: Take 1 tablet by mouth once daily     Cardiovascular:  ACE Inhibitors Failed - 09/01/2021  2:00 PM      Failed - Last BP in normal range    BP Readings from Last 1 Encounters:  07/20/21 (!) 149/66         Passed - Cr in normal range and within 180 days    Creatinine, Ser  Date Value Ref Range Status  03/23/2021 0.93 0.57 - 1.00 mg/dL Final         Passed - K in normal range and within 180 days    Potassium  Date Value Ref Range Status  03/23/2021 3.7 3.5 - 5.2 mmol/L Final         Passed - Patient is not pregnant      Passed - Valid encounter within last 6 months    Recent Outpatient Visits          1 month ago Need for influenza vaccination   [facility] Vigg, Avanti, MD   5 months ago Hypothyroidism, unspecified type   [facility] Vigg, Avanti, MD   7 months ago Hyperlipidemia, unspecified hyperlipidemia type   [facility] Vigg, Avanti, MD   7 months ago Acquired hypothyroidism   [facility] Vigg, Avanti, MD   9 months ago Atrial fibrillation with RVR ([place])   [place], Barbaraann Faster, NP      Future Appointments            In 2 months Vigg, Avanti, MD [facility], [facility]   In 68 months  [facility], [facility]

## 2021-09-03 ENCOUNTER — Other Ambulatory Visit: Payer: Self-pay | Admitting: Internal Medicine

## 2021-09-03 MED ORDER — METOPROLOL SUCCINATE ER 25 MG PO TB24: 25.0000 mg | ORAL_TABLET | Freq: Every day | ORAL | 0 refills | Status: DC

## 2021-09-03 NOTE — Telephone Encounter (Signed)
Copied from [place] [id number]. Topic: Quick Communication - Rx Refill/Question >> Sep 03, 2021  8:06 AM Leward Quan A wrote: Medication: metoprolol succinate (TOPROL-XL) 25 MG 24 hr tablet Would like 90 day plus extra refills please Has the patient contacted their pharmacy? Yes.  Rx was not yet sent in  (Agent: If no, request that the patient contact the pharmacy for the refill. If patient does not wish to contact the pharmacy document the reason why and proceed with request.) (Agent: If yes, when and what did the pharmacy advise?)  Preferred Pharmacy (with phone number or street name): [facility]), [place] - [address]  Phone:  [phone] Fax:  [phone]    Has the patient been seen for an appointment in the last year OR does the patient have an upcoming appointment? Yes.    Agent: Please be advised that RX refills may take up to 3 business days. We ask that you follow-up with your pharmacy.

## 2021-09-03 NOTE — Telephone Encounter (Signed)
Patient is up to date and not overdue with appointments 90 day granted Requested Prescriptions  Pending Prescriptions Disp Refills  . metoprolol succinate (TOPROL-XL) 25 MG 24 hr tablet 90 tablet 0    Sig: Take 1 tablet (25 mg total) by mouth daily.     Cardiovascular:  Beta Blockers Failed - 09/03/2021 10:17 AM      Failed - Last BP in normal range    BP Readings from Last 1 Encounters:  07/20/21 (!) 149/66         Passed - Last Heart Rate in normal range    Pulse Readings from Last 1 Encounters:  07/20/21 (!) 50         Passed - Valid encounter within last 6 months    Recent Outpatient Visits          1 month ago Need for influenza vaccination   [facility] Vigg, Avanti, MD   5 months ago Hypothyroidism, unspecified type   [facility] Vigg, Avanti, MD   7 months ago Hyperlipidemia, unspecified hyperlipidemia type   [facility] Vigg, Avanti, MD   7 months ago Acquired hypothyroidism   [place] Vigg, Avanti, MD   9 months ago Atrial fibrillation with RVR ([place])   [place], Barbaraann Faster, NP      Future Appointments            In 2 months Vigg, Avanti, MD [facility], [facility]   In 70 months  [facility], [place]

## 2021-09-03 NOTE — Telephone Encounter (Signed)
[place] called and spoke to [place], [facility] about the refill(s) Lisinopril requested. Advised it was sent on 09/01/21 #90/0 refill(s). Pharmacist states prescription is on file and has been picked up. Will refuse this request.  Requested Prescriptions  Pending Prescriptions Disp Refills   lisinopril (ZESTRIL) 40 MG tablet [Pharmacy Med Name: Lisinopril 40 MG Oral Tablet] 90 tablet 0    Sig: Take 1 tablet by mouth once daily     Cardiovascular:  ACE Inhibitors Failed - 09/03/2021  8:08 AM      Failed - Last BP in normal range    BP Readings from Last 1 Encounters:  07/20/21 (!) 149/66          Passed - Cr in normal range and within 180 days    Creatinine, Ser  Date Value Ref Range Status  03/23/2021 0.93 0.57 - 1.00 mg/dL Final          Passed - K in normal range and within 180 days    Potassium  Date Value Ref Range Status  03/23/2021 3.7 3.5 - 5.2 mmol/L Final          Passed - Patient is not pregnant      Passed - Valid encounter within last 6 months    Recent Outpatient Visits           1 month ago Need for influenza vaccination   [facility] Vigg, Avanti, MD   5 months ago Hypothyroidism, unspecified type   [facility] Vigg, Avanti, MD   7 months ago Hyperlipidemia, unspecified hyperlipidemia type   [facility] Vigg, Avanti, MD   7 months ago Acquired hypothyroidism   [facility] Vigg, Avanti, MD   9 months ago Atrial fibrillation with RVR ([place])   [place], Barbaraann Faster, NP       Future Appointments             In 2 months Vigg, Avanti, MD [facility], [facility]   In 13 months  [facility], [facility]

## 2021-09-14 DIAGNOSIS — I4891 Unspecified atrial fibrillation: Secondary | ICD-10-CM | POA: Diagnosis not present

## 2021-09-14 DIAGNOSIS — I10 Essential (primary) hypertension: Secondary | ICD-10-CM | POA: Diagnosis not present

## 2021-09-14 DIAGNOSIS — E782 Mixed hyperlipidemia: Secondary | ICD-10-CM | POA: Diagnosis not present

## 2021-09-19 ENCOUNTER — Telehealth: Payer: Self-pay

## 2021-09-19 NOTE — Chronic Care Management (AMB) (Signed)
    Chronic Care Management Pharmacy Assistant   Name: Courtney Anderson  MRN: [id number] DOB: [birth year]  Reason for Encounter: Disease State General   Recent office visits:  07/20/21-[name], MD (PCP) Seen for general follow up visit. Flu vaccine given. Follow up in 3 months. 03/23/21-[name], MD (PCP) General follow up visit. Increase lisinopril 40 mg. Xarelto was discontinued.  Protonix 40 mg daily x6 weeks. Follow up in 3 months.  Recent consult visits:  07/13/21-[name] (Endocrinology) Diabetic follow up visit. Follow up in 6 months.  Hospital visits:  None in previous 6 months  Medications: Outpatient Encounter Medications as of 09/19/2021  Medication Sig   amLODipine (NORVASC) 5 MG tablet Take 5 mg by mouth daily.   Cholecalciferol (VITAMIN D) 50 MCG (2000 UT) tablet Take 2,000 Units by mouth daily.   EUTHYROX 88 MCG tablet Take 88 mcg by mouth every morning.   Iron-Vitamin C 65-125 MG TABS Take 1 tablet by mouth in the morning and at bedtime. Will recheck iron level next visit.   lisinopril (ZESTRIL) 40 MG tablet Take 1 tablet by mouth once daily   metoprolol succinate (TOPROL-XL) 25 MG 24 hr tablet Take 1 tablet (25 mg total) by mouth daily.   Multiple Vitamins-Minerals (ONE-A-DAY WOMENS 50+ ADVANTAGE) TABS Take 1 tablet by mouth daily.   No facility-administered encounter medications on file as of 09/19/2021.   Have you had any problems recently with your health? Patient states she has been doing well.  Have you had any problems with your pharmacy? Patient states she has no issues with her pharmacy.  What issues or side effects are you having with your medications? Patient states she has no issues or side effects to her medications.  What would you like me to pass along to [name],CPP for them to help you with?  Patient states she was taking off the metoprolol by Dr. [name].  What can we do to take care of you better? Patient states  she was taking off the metoprolol by Dr. [name].  Care Gaps: [id number] Vaccine:Overdue since 03/02/2021:  Star Rating Drugs: Lisinopril 40 mg last filled:09/03/21 90 DS  Myriam Elta [place], [name]

## 2021-10-04 DIAGNOSIS — E042 Nontoxic multinodular goiter: Secondary | ICD-10-CM | POA: Diagnosis not present

## 2021-10-05 DIAGNOSIS — E042 Nontoxic multinodular goiter: Secondary | ICD-10-CM | POA: Diagnosis not present

## 2021-10-15 ENCOUNTER — Telehealth: Payer: Self-pay

## 2021-10-15 NOTE — Chronic Care Management (AMB) (Signed)
    Chronic Care Management Pharmacy Assistant   Name: Courtney Anderson  MRN: [id number] DOB: November 18, 1955  Reason for Encounter: Disease State General  Recent office visits:  07/20/21-Avanti Vidd, MD (PCP) Seen for general follow up visit. Flu vaccine given. Follow up in 3 months.  Recent consult visits:  09/09/2-[name] (Endocrinology) Seen for general follow up visit. Follow up in 6 months.  Hospital visits:  None in previous 6 months  Medications: Outpatient Encounter Medications as of 10/15/2021  Medication Sig   amLODipine (NORVASC) 5 MG tablet Take 5 mg by mouth daily.   Cholecalciferol (VITAMIN D) 50 MCG (2000 UT) tablet Take 2,000 Units by mouth daily.   EUTHYROX 88 MCG tablet Take 88 mcg by mouth every morning.   Iron-Vitamin C 65-125 MG TABS Take 1 tablet by mouth in the morning and at bedtime. Will recheck iron level next visit.   lisinopril (ZESTRIL) 40 MG tablet Take 1 tablet by mouth once daily   metoprolol succinate (TOPROL-XL) 25 MG 24 hr tablet Take 1 tablet (25 mg total) by mouth daily.   Multiple Vitamins-Minerals (ONE-A-DAY WOMENS 50+ ADVANTAGE) TABS Take 1 tablet by mouth daily.   No facility-administered encounter medications on file as of 10/15/2021.   Have you had any problems recently with your health? Patient states she has no problems with her health.  Have you had any problems with your pharmacy? Patient states she has no problems with her pharmacy.  What issues or side effects are you having with your medications? Patient states she has no issues or side effects to her medications.  What would you like me to pass along to Edison Nasuti Potts,CPP for them to help you with?  Patient states she was taken off the Metoprolol by Dr. [name].  What can we do to take care of you better? Patient states there is nothing at this time.  Reminded patient about upcoming CPP appointment ton 11/12/21 @ 9:30. Patient was aware.  Care Gaps: [id number]  Vaccine:Overdue since 03/02/2021  Star Rating Drugs: Lisinopril 40 mg Last filled:09/03/21 90 DS  Myriam Elta [place], [place]

## 2021-11-12 ENCOUNTER — Ambulatory Visit (INDEPENDENT_AMBULATORY_CARE_PROVIDER_SITE_OTHER): Payer: Medicare Other

## 2021-11-12 DIAGNOSIS — I10 Essential (primary) hypertension: Secondary | ICD-10-CM

## 2021-11-12 DIAGNOSIS — I4891 Unspecified atrial fibrillation: Secondary | ICD-10-CM

## 2021-11-12 DIAGNOSIS — E039 Hypothyroidism, unspecified: Secondary | ICD-10-CM

## 2021-11-12 NOTE — Progress Notes (Signed)
Chronic Care Management Pharmacy Note  11/12/2021 Name:  Courtney Anderson MRN:  [id number] DOB:  [birth year]  Summary: -Update: Reports that cardio has stopped metoprolol succinate  Subjective: Courtney Anderson is an 66 y.o. year old female who is a primary patient of Vigg, Avanti, MD.  The CCM team was consulted for assistance with disease management and care coordination needs.    Engaged with patient by telephone for follow up visit in response to provider referral for pharmacy case management and/or care coordination services.   Consent to Services:  The patient was given information about Chronic Care Management services, agreed to services, and gave verbal consent prior to initiation of services.  Please see initial visit note for detailed documentation.   Patient Care Team: Charlynne Cousins, MD as PCP - General [name], [facility] (Pharmacist) Hospital visits: None in previous 6 months  Objective:  Lab Results  Component Value Date   CREATININE 0.93 03/23/2021   CREATININE 1.02 (H) 01/12/2021   CREATININE 1.12 (H) 12/01/2020    Lab Results  Component Value Date   HGBA1C 7.1 (H) 08/03/2020   Last diabetic Eye exam: No results found for: HMDIABEYEEXA  Last diabetic Foot exam: No results found for: HMDIABFOOTEX      Component Value Date/Time   CHOL 207 (H) 03/23/2021 0953   CHOL 182 06/02/2015 1107   TRIG 90 03/23/2021 0953   TRIG 93 06/02/2015 1107   HDL 51 03/23/2021 0953   CHOLHDL 4.1 03/23/2021 0953   VLDL 19 06/02/2015 1107   LDLCALC 140 (H) 03/23/2021 0953    Hepatic Function Latest Ref Rng & Units 03/23/2021 01/12/2021 12/01/2020  Total Protein 6.0 - 8.5 g/dL 7.6 8.1 7.4  Albumin 3.8 - 4.8 g/dL 4.5 4.1 3.8  AST 0 - 40 IU/L '19 23 21  ' ALT 0 - 32 IU/L '14 14 8  ' Alk Phosphatase 44 - 121 IU/L 113 96 74  Total Bilirubin 0.0 - 1.2 mg/dL 0.2 0.3 0.3  Bilirubin, Direct 0.0 - 0.2 mg/dL - - -    Lab Results  Component Value Date/Time   TSH 11.600 (H) 03/23/2021 09:53  AM   TSH 49.300 (H) 01/12/2021 09:33 AM   FREET4 0.66 (L) 12/01/2020 11:40 AM   FREET4 0.94 07/28/2020 05:01 AM    CBC Latest Ref Rng & Units 01/12/2021 12/01/2020 10/30/2020  WBC 3.4 - 10.8 x10E3/uL 6.1 7.7 9.2  Hemoglobin 11.1 - 15.9 g/dL 11.0(L) 8.6(L) 8.3(L)  Hematocrit 34.0 - 46.6 % 36.8 28.7(L) 25.5(L)  Platelets 150 - 450 x10E3/uL 358 436 297    Lab Results  Component Value Date/Time   VD25OH 33.9 12/01/2020 11:40 AM   VD25OH 26.4 (L) 10/22/2019 11:00 AM    Clinical ASCVD: No  The 10-year ASCVD risk score (Arnett DK, et al., 2019) is: 20.6%   Values used to calculate the score:     Age: 52 years     Sex: Female     Is Non-Hispanic African American: Yes     Diabetic: No     Tobacco smoker: No     Systolic Blood Pressure: 027 mmHg     Is BP treated: Yes     HDL Cholesterol: 51 mg/dL     Total Cholesterol: 207 mg/dL    Other: (CHADS2VASc if Afib, PHQ9 if depression, MMRC or CAT for COPD, ACT, DEXA)  Social History   Tobacco Use  Smoking Status Former  Smokeless Tobacco Never   BP Readings from Last 3 Encounters:  07/20/21 (!) 149/66  03/23/21 131/70  02/02/21 (!) 164/74   Pulse Readings from Last 3 Encounters:  07/20/21 (!) 50  03/23/21 (!) 54  02/02/21 (!) 51   Wt Readings from Last 3 Encounters:  08/06/21 200 lb (90.7 kg)  07/20/21 228 lb 12.8 oz (103.8 kg)  03/23/21 229 lb (103.9 kg)    Assessment: Review of patient past medical history, allergies, medications, health status, including review of consultants reports, laboratory and other test data, was performed as part of comprehensive evaluation and provision of chronic care management services.   SDOH:  (Social Determinants of Health) assessments and interventions performed:    CCM Care Plan  Allergies  Allergen Reactions   Codeine     Medications Reviewed Today     Reviewed by [name], [facility] (Pharmacist) on 11/12/21 at 1033  Med List Status: <None>   Medication Order Taking? Sig  Documenting Provider Last Dose Status Informant  amLODipine (NORVASC) 5 MG tablet [id number] Yes Take 5 mg by mouth daily. [provider] Taking Active   Cholecalciferol (VITAMIN D) 50 MCG (2000 UT) tablet [id number]  Take 2,000 Units by mouth daily. [provider]  Active Self  EUTHYROX 88 MCG tablet [id number] Yes Take 88 mcg by mouth every morning. [provider] Taking Active   Iron-Vitamin C 65-125 MG TABS [id number]  Take 1 tablet by mouth in the morning and at bedtime. Will recheck iron level next visit. Marnee Guarneri T, NP  Active   lisinopril (ZESTRIL) 40 MG tablet [id number] Yes Take 1 tablet by mouth once daily Vigg, Avanti, MD Taking Active   metoprolol succinate (TOPROL-XL) 25 MG 24 hr tablet [id number] No Take 1 tablet (25 mg total) by mouth daily.  Patient not taking: Reported on 11/12/2021   Charlynne Cousins, MD Not Taking Active   Multiple Vitamins-Minerals (ONE-A-DAY WOMENS 50+ ADVANTAGE) TABS [id number] Yes Take 1 tablet by mouth daily. [provider] Taking Active             Patient Active Problem List   Diagnosis Date Noted   Need for influenza vaccination 07/20/2021   Bradycardia 01/19/2021   Primary hyperparathyroidism ([place]) 12/02/2020   Hypothyroidism 12/02/2020   Anemia 12/01/2020   History of 2019 novel coronavirus disease (COVID-19) 11/24/2020   Atrial fibrillation with RVR ([place]) 07/24/2020   Vitamin D deficiency 10/17/2019   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   History of hyperthyroidism 08/20/2018   Multinodular goiter 08/20/2018   Hypertension 05/31/2015   Morbid obesity ([place]) 05/31/2015   Elevated transaminase level 05/31/2015   Hyperlipidemia 05/31/2015    Immunization History  Administered Date(s) Administered   Fluad Quad(high Dose 65+) 07/20/2021   Influenza,inj,Quad PF,6+ Mos 12/19/2016, 08/29/2017, 10/22/2019   PFIZER(Purple Top)SARS-COV-2 Vaccination 12/15/2020, 01/05/2021   Pneumococcal  Conjugate-13 03/23/2021   Td 08/24/2007    Conditions to be addressed/monitored: Atrial Fibrillation, HTN, HLD, and Hypothyroidism  Care Plan : [place]  Updates made by [name], [facility] since 11/12/2021 12:00 AM     Problem: Afib, h/o GIB on anticoagulation, HTN, HLD,hypothyroidism   Priority: High     Long-Range Goal: disease Management   Start Date: 11/12/2021  This Visit's Progress: On track  Recent Progress: Not on track  Priority: High  Note:   Current Barriers:  Unable to maintain control of HTN  Pharmacist Clinical Goal(s):  Patient will verbalize ability to afford treatment regimen through collaboration with PharmD and provider.   Interventions: 1:1 collaboration with Charlynne Cousins, MD  regarding development and update of comprehensive plan of care as evidenced by provider attestation and co-signature Inter-disciplinary care team collaboration (see longitudinal plan of care) Comprehensive medication review performed; medication list updated in electronic medical record  Hypertension (BP goal <140/90) -Not ideally controlled -Current treatment: Amlodipine 5 mg  Lisinopril 40 mg  -Medications previously tried: metoprolol succinate stopped 09/2021 -Current home readings: <140/90 -Current exercise habits: reports to be going to the gym daily, using weights, walking -Denies hypotensive/hypertensive symptoms -Educated on BP goals and benefits of medications for prevention of heart attack, stroke and kidney damage; Importance of home blood pressure monitoring; -Counseled to monitor BP at home 1-2x/wk, document, and provide log at future appointments -Counseled on diet and exercise extensively Recommended to continue current medication Patient planning to make dietary changes: will eat less grits and cereal, reduce consumption of bread.   Hypothyroidism (Goal: minimize symptoms) -Not ideally controlled -Saw endo 12/1 (Dr Honor Junes) - pt reports good news  from biopsy  -Current treatment  Euthyroxine 88 mcg once daily: Appropriate, Query effective -Medications previously tried: n/a  -Counseled on diet and exercise extensively Recommended to continue current medication Counseled on appropriate administration of medication.   Atrial Fibrillation (Goal: prevent stroke and major bleeding) -Controlled -Cardio visit planned for EOM 11/2021 per pt   -Previous GI bleed on Xarelto  -CHADSVASC: 3 -Current treatment: Rate control: no tx, previously on metoprolol succinate Anticoagulation: no tx, previously on Xarelto (GI bleed)  -Home BP and HR readings: at goal   -Counseled on Afib action plan reviewed -Assessed patient finances. Not applicable at this time.  Patient Goals/Self-Care Activities Patient will:  - take medications as prescribed as evidenced by patient report and record review  Follow Up Plan: PharmD telephone f/u 6 months. BP HC call 3 months  Medication Assistance: None required.  Patient affirms current coverage meets needs.  Patient's preferred pharmacy is:  [facility] [address] (N), [place] - [address] [place] ([place]) [place] [address] Phone: [phone] Fax: [phone]  Pt endorses 100% compliance  Follow Up:  Patient agrees to Care Plan and Follow-up.      Future Appointments  Date Time Provider [place]  11/23/2021  9:00 AM Charlynne Cousins, MD [facility]  05/20/2022  9:30 AM CFP CCM PHARMACY [facility]  08/09/2022  8:15 AM CFP NURSE HEALTH ADVISOR [facility] [place], PharmD, [facility] Clinical Pharmacist  [facility] [phone]

## 2021-11-23 ENCOUNTER — Ambulatory Visit: Payer: Medicare Other | Admitting: Internal Medicine

## 2021-11-30 ENCOUNTER — Other Ambulatory Visit: Payer: Self-pay | Admitting: Internal Medicine

## 2021-11-30 MED ORDER — LISINOPRIL 40 MG PO TABS: 40.0000 mg | ORAL_TABLET | Freq: Every day | ORAL | 0 refills | Status: DC

## 2021-11-30 NOTE — Telephone Encounter (Signed)
Requested Prescriptions  Pending Prescriptions Disp Refills   lisinopril (ZESTRIL) 40 MG tablet 90 tablet 0    Sig: Take 1 tablet (40 mg total) by mouth daily.     Cardiovascular:  ACE Inhibitors Failed - 11/30/2021  9:10 AM      Failed - Cr in normal range and within 180 days    Creatinine, Ser  Date Value Ref Range Status  03/23/2021 0.93 0.57 - 1.00 mg/dL Final         Failed - K in normal range and within 180 days    Potassium  Date Value Ref Range Status  03/23/2021 3.7 3.5 - 5.2 mmol/L Final         Failed - Last BP in normal range    BP Readings from Last 1 Encounters:  07/20/21 (!) 149/66         Passed - Patient is not pregnant      Passed - Valid encounter within last 6 months    Recent Outpatient Visits          4 months ago Need for influenza vaccination   [facility] Vigg, Avanti, MD   8 months ago Hypothyroidism, unspecified type   [facility] Vigg, Avanti, MD   10 months ago Hyperlipidemia, unspecified hyperlipidemia type   [facility] Vigg, Avanti, MD   10 months ago Acquired hypothyroidism   [place] Vigg, Avanti, MD   12 months ago Atrial fibrillation with RVR ([place])   [place], Barbaraann Faster, NP      Future Appointments            In 1 week Vigg, Avanti, MD [facility], [facility]   In 8 months  [facility] [employer], [facility]

## 2021-11-30 NOTE — Telephone Encounter (Signed)
Medication: lisinopril (ZESTRIL) 40 MG tablet [[id number]]   Has the patient contacted their pharmacy? YES (Agent: If no, request that the patient contact the pharmacy for the refill. If patient does not wish to contact the pharmacy document the reason why and proceed with request.) (Agent: If yes, when and what did the pharmacy advise?)  Preferred Pharmacy (with phone number or street name): [facility] (N), [place] - [address] [place] ([place]) [place] [address] Phone: [phone] Fax: [phone] Hours: Not open 24 hours   Has the patient been seen for an appointment in the last year OR does the patient have an upcoming appointment? YES   Agent: Please be advised that RX refills may take up to 3 business days. We ask that you follow-up with your pharmacy.

## 2021-12-03 DIAGNOSIS — I4891 Unspecified atrial fibrillation: Secondary | ICD-10-CM | POA: Diagnosis not present

## 2021-12-04 DIAGNOSIS — I10 Essential (primary) hypertension: Secondary | ICD-10-CM | POA: Diagnosis not present

## 2021-12-04 DIAGNOSIS — I4891 Unspecified atrial fibrillation: Secondary | ICD-10-CM | POA: Diagnosis not present

## 2021-12-04 DIAGNOSIS — E039 Hypothyroidism, unspecified: Secondary | ICD-10-CM

## 2021-12-07 ENCOUNTER — Ambulatory Visit (INDEPENDENT_AMBULATORY_CARE_PROVIDER_SITE_OTHER): Payer: Medicare Other | Admitting: Internal Medicine

## 2021-12-07 ENCOUNTER — Encounter: Payer: Self-pay | Admitting: Internal Medicine

## 2021-12-07 ENCOUNTER — Other Ambulatory Visit: Payer: Self-pay

## 2021-12-07 VITALS — BP 160/82 | HR 76 | Temp 98.1°F | Ht 67.52 in | Wt 230.6 lb

## 2021-12-07 DIAGNOSIS — E039 Hypothyroidism, unspecified: Secondary | ICD-10-CM | POA: Diagnosis not present

## 2021-12-07 LAB — URINALYSIS, ROUTINE W REFLEX MICROSCOPIC
Bilirubin, UA: NEGATIVE
Glucose, UA: NEGATIVE
Ketones, UA: NEGATIVE
Nitrite, UA: NEGATIVE
Protein,UA: NEGATIVE
Specific Gravity, UA: 1.02 (ref 1.005–1.030)
Urobilinogen, Ur: 0.2 mg/dL (ref 0.2–1.0)
pH, UA: 7 (ref 5.0–7.5)

## 2021-12-07 LAB — MICROSCOPIC EXAMINATION

## 2021-12-07 MED ORDER — AMLODIPINE BESYLATE 10 MG PO TABS: 10.0000 mg | ORAL_TABLET | Freq: Every day | ORAL | 4 refills | Status: DC

## 2021-12-07 NOTE — Progress Notes (Signed)
BP (!) 160/82    Pulse 76    Temp 98.1 F (36.7 C) (Oral)    Ht 5' 7.52" (1.715 m)    Wt 230 lb 9.6 oz (104.6 kg)    SpO2 98%    BMI 35.56 kg/m    Subjective:    Patient ID: Courtney Anderson, female    DOB: 04-01-1956, 66 y.o.   MRN: [id number]  Chief Complaint  Patient presents with   Hypertension    Labs were not done last week    Hyperlipidemia    HPI: Courtney Anderson is a 66 y.o. female  Hypertension This is a chronic problem. The current episode started more than 1 month ago. The problem is controlled. Pertinent negatives include no anxiety, blurred vision, headaches, malaise/fatigue, neck pain, orthopnea or palpitations.  Hyperlipidemia This is a chronic problem. The problem is controlled.   Chief Complaint  Patient presents with   Hypertension    Labs were not done last week    Hyperlipidemia    Relevant past medical, surgical, family and social history reviewed and updated as indicated. Interim medical history since our last visit reviewed. Allergies and medications reviewed and updated.  Review of Systems  Constitutional:  Negative for malaise/fatigue.  Eyes:  Negative for blurred vision.  Cardiovascular:  Negative for palpitations and orthopnea.  Musculoskeletal:  Negative for neck pain.  Neurological:  Negative for headaches.   Per HPI unless specifically indicated above     Objective:    BP (!) 160/82    Pulse 76    Temp 98.1 F (36.7 C) (Oral)    Ht 5' 7.52" (1.715 m)    Wt 230 lb 9.6 oz (104.6 kg)    SpO2 98%    BMI 35.56 kg/m   Wt Readings from Last 3 Encounters:  12/07/21 230 lb 9.6 oz (104.6 kg)  08/06/21 200 lb (90.7 kg)  07/20/21 228 lb 12.8 oz (103.8 kg)    Physical Exam Vitals and nursing note reviewed.  Constitutional:      General: She is not in acute distress.    Appearance: Normal appearance. She is not ill-appearing or diaphoretic.  Eyes:     Conjunctiva/sclera: Conjunctivae normal.  Pulmonary:     Breath sounds: No rhonchi.   Abdominal:     General: Abdomen is flat. Bowel sounds are normal. There is no distension.     Palpations: Abdomen is soft. There is no mass.     Tenderness: There is no abdominal tenderness. There is no guarding.  Skin:    General: Skin is warm and dry.     Coloration: Skin is not jaundiced.     Findings: No erythema.  Neurological:     Mental Status: She is alert.    Results for orders placed or performed in visit on 12/07/21  Microscopic Examination   Urine  Result Value Ref Range   WBC, UA 0-5 0 - 5 /hpf   RBC 0-2 0 - 2 /hpf   Epithelial Cells (non renal) 0-10 0 - 10 /hpf   Bacteria, UA Moderate (A) None seen/Few  Urinalysis, Routine w reflex microscopic  Result Value Ref Range   Specific Gravity, UA 1.020 1.005 - 1.030   pH, UA 7.0 5.0 - 7.5   Color, UA Yellow Yellow   Appearance Ur Cloudy (A) Clear   Leukocytes,UA 1+ (A) Negative   Protein,UA Negative Negative/Trace   Glucose, UA Negative Negative   Ketones, UA Negative Negative   RBC,  UA Trace (A) Negative   Bilirubin, UA Negative Negative   Urobilinogen, Ur 0.2 0.2 - 1.0 mg/dL   Nitrite, UA Negative Negative   Microscopic Examination See below:         Current Outpatient Medications:    Cholecalciferol (VITAMIN D) 50 MCG (2000 UT) tablet, Take 2,000 Units by mouth daily., Disp: , Rfl:    EUTHYROX 88 MCG tablet, Take 88 mcg by mouth every morning., Disp: , Rfl:    Iron-Vitamin C 65-125 MG TABS, Take 1 tablet by mouth in the morning and at bedtime. Will recheck iron level next visit., Disp: 60 tablet, Rfl: 4   lisinopril (ZESTRIL) 40 MG tablet, Take 1 tablet (40 mg total) by mouth daily., Disp: 90 tablet, Rfl: 0   Multiple Vitamins-Minerals (ONE-A-DAY WOMENS 50+ ADVANTAGE) TABS, Take 1 tablet by mouth daily., Disp: , Rfl:    amLODipine (NORVASC) 10 MG tablet, Take 1 tablet (10 mg total) by mouth daily., Disp: 30 tablet, Rfl: 4    Assessment & Plan:  Hypothyroidism  With a ho multinodular goiter s/p bx was wnl  per endo notes fu and mx per them No labs since sept Recheck per pt endo has increased her synthorid recently.  2. HTN : Is on norvas and lisinorpil for such  Continue current meds.  Medication compliance emphasised. pt advised to keep Bp logs. Pt verbalised understanding of the same. Pt to have a low salt diet . Exercise to reach a goal of at least 150 mins a week.  lifestyle modifications explained and pt understands importance of the above. Under good control on current regimen. Continue current regimen. Continue to monitor. Call with any concerns. Refills given. Labs drawn today.  Problem List Items Addressed This Visit       Endocrine   Hypothyroidism - Primary   Relevant Orders   CBC with Differential/Platelet   Comprehensive metabolic panel   TSH   Lipid panel   Urinalysis, Routine w reflex microscopic (Completed)   T4, free     Orders Placed This Encounter  Procedures   Microscopic Examination   CBC with Differential/Platelet   Comprehensive metabolic panel   TSH   Lipid panel   Urinalysis, Routine w reflex microscopic   T4, free     Meds ordered this encounter  Medications   amLODipine (NORVASC) 10 MG tablet    Sig: Take 1 tablet (10 mg total) by mouth daily.    Dispense:  30 tablet    Refill:  4     Follow up plan: Return in about 3 months (around 03/06/2022).

## 2021-12-07 NOTE — Patient Instructions (Addendum)
[employer]  Calorie Counting for Weight Loss Calories are units of energy. Your body needs a certain number of calories from food to keep going throughout the day. When you eat or drink more calories than your body needs, your body stores the extra calories mostly as fat. When you eat or drink fewer calories than your body needs, your body burns fat to get the energy it needs. Calorie counting means keeping track of how many calories you eat and drink each day. Calorie counting can be helpful if you need to lose weight. If you eat fewer calories than your body needs, you should lose weight. Ask your health care provider what a healthy weight is for you. For calorie counting to work, you will need to eat the right number of calories each day to lose a healthy amount of weight per week. A dietitian can help you figure out how many calories you need in a day and will suggest ways to reach your calorie goal. A healthy amount of weight to lose each week is usually 1-2 lb (0.5-0.9 kg). This usually means that your daily calorie intake should be reduced by 500-750 calories. Eating 1,200-1,500 calories a day can help most women lose weight. Eating 1,500-1,800 calories a day can help most men lose weight. What do I need to know about calorie counting? Work with your health care provider or dietitian to determine how many calories you should get each day. To meet your daily calorie goal, you will need to: Find out how many calories are in each food that you would like to eat. Try to do this before you eat. Decide how much of the food you plan to eat. Keep a food log. Do this by writing down what you ate and how many calories it had. To successfully lose weight, it is important to balance calorie counting with a healthy lifestyle that includes regular activity. Where do I find calorie information? The number of calories in a food can be found on a Nutrition Facts label. If a food does not have a Nutrition  Facts label, try to look up the calories online or ask your dietitian for help. Remember that calories are listed per serving. If you choose to have more than one serving of a food, you will have to multiply the calories per serving by the number of servings you plan to eat. For example, the label on a package of bread might say that a serving size is 1 slice and that there are 90 calories in a serving. If you eat 1 slice, you will have eaten 90 calories. If you eat 2 slices, you will have eaten 180 calories. How do I keep a food log? After each time that you eat, record the following in your food log as soon as possible: What you ate. Be sure to include toppings, sauces, and other extras on the food. How much you ate. This can be measured in cups, ounces, or number of items. How many calories were in each food and drink. The total number of calories in the food you ate. Keep your food log near you, such as in a pocket-sized notebook or on an app or website on your mobile phone. Some programs will calculate calories for you and show you how many calories you have left to meet your daily goal. What are some portion-control tips? Know how many calories are in a serving. This will help you know how many servings you can have  of a certain food. Use a measuring cup to measure serving sizes. You could also try weighing out portions on a kitchen scale. With time, you will be able to estimate serving sizes for some foods. Take time to put servings of different foods on your favorite plates or in your favorite bowls and cups so you know what a serving looks like. Try not to eat straight from a food's packaging, such as from a bag or box. Eating straight from the package makes it hard to see how much you are eating and can lead to overeating. Put the amount you would like to eat in a cup or on a plate to make sure you are eating the right portion. Use smaller plates, glasses, and bowls for smaller portions and  to prevent overeating. Try not to multitask. For example, avoid watching TV or using your computer while eating. If it is time to eat, sit down at a table and enjoy your food. This will help you recognize when you are full. It will also help you be more mindful of what and how much you are eating. What are tips for following this plan? Reading food labels Check the calorie count compared with the serving size. The serving size may be smaller than what you are used to eating. Check the source of the calories. Try to choose foods that are high in protein, fiber, and vitamins, and low in saturated fat, trans fat, and sodium. Shopping Read nutrition labels while you shop. This will help you make healthy decisions about which foods to buy. Pay attention to nutrition labels for low-fat or fat-free foods. These foods sometimes have the same number of calories or more calories than the full-fat versions. They also often have added sugar, starch, or salt to make up for flavor that was removed with the fat. Make a grocery list of lower-calorie foods and stick to it. Cooking Try to cook your favorite foods in a healthier way. For example, try baking instead of frying. Use low-fat dairy products. Meal planning Use more fruits and vegetables. One-half of your plate should be fruits and vegetables. Include lean proteins, such as chicken, [place], and fish. Lifestyle Each week, aim to do one of the following: 150 minutes of moderate exercise, such as walking. 75 minutes of vigorous exercise, such as running. General information Know how many calories are in the foods you eat most often. This will help you calculate calorie counts faster. Find a way of tracking calories that works for you. Get creative. Try different apps or programs if writing down calories does not work for you. What foods should I eat?  Eat nutritious foods. It is better to have a nutritious, high-calorie food, such as an avocado, than a  food with few nutrients, such as a bag of potato chips. Use your calories on foods and drinks that will fill you up and will not leave you hungry soon after eating. Examples of foods that fill you up are nuts and nut butters, vegetables, lean proteins, and high-fiber foods such as whole grains. High-fiber foods are foods with more than 5 g of fiber per serving. Pay attention to calories in drinks. Low-calorie drinks include water and unsweetened drinks. The items listed above may not be a complete list of foods and beverages you can eat. Contact a dietitian for more information. What foods should I limit? Limit foods or drinks that are not good sources of vitamins, minerals, or protein or that are high in  unhealthy fats. These include: Candy. Other sweets. Sodas, specialty coffee drinks, alcohol, and juice. The items listed above may not be a complete list of foods and beverages you should avoid. Contact a dietitian for more information. How do I count calories when eating out? Pay attention to portions. Often, portions are much larger when eating out. Try these tips to keep portions smaller: Consider sharing a meal instead of getting your own. If you get your own meal, eat only half of it. Before you start eating, ask for a container and put half of your meal into it. When available, consider ordering smaller portions from the menu instead of full portions. Pay attention to your food and drink choices. Knowing the way food is cooked and what is included with the meal can help you eat fewer calories. If calories are listed on the menu, choose the lower-calorie options. Choose dishes that include vegetables, fruits, whole grains, low-fat dairy products, and lean proteins. Choose items that are boiled, broiled, grilled, or steamed. Avoid items that are buttered, battered, fried, or served with cream sauce. Items labeled as crispy are usually fried, unless stated otherwise. Choose water, low-fat  milk, unsweetened iced tea, or other drinks without added sugar. If you want an alcoholic beverage, choose a lower-calorie option, such as a glass of wine or light beer. Ask for dressings, sauces, and syrups on the side. These are usually high in calories, so you should limit the amount you eat. If you want a salad, choose a garden salad and ask for grilled meats. Avoid extra toppings such as bacon, cheese, or fried items. Ask for the dressing on the side, or ask for olive oil and vinegar or lemon to use as dressing. Estimate how many servings of a food you are given. Knowing serving sizes will help you be aware of how much food you are eating at restaurants. Where to find more information Centers for Disease Control and Prevention: http://www.wolf.info/ U.[place]. Department of Agriculture: http://www.wilson-mendoza.org/ Summary Calorie counting means keeping track of how many calories you eat and drink each day. If you eat fewer calories than your body needs, you should lose weight. A healthy amount of weight to lose per week is usually 1-2 lb (0.5-0.9 kg). This usually means reducing your daily calorie intake by 500-750 calories. The number of calories in a food can be found on a Nutrition Facts label. If a food does not have a Nutrition Facts label, try to look up the calories online or ask your dietitian for help. Use smaller plates, glasses, and bowls for smaller portions and to prevent overeating. Use your calories on foods and drinks that will fill you up and not leave you hungry shortly after a meal. This information is not intended to replace advice given to you by your health care provider. Make sure you discuss any questions you have with your health care provider. Document Revised: 12/02/2019 Document Reviewed: 12/02/2019 Elsevier Patient Education  2022 [employer].

## 2021-12-08 LAB — CBC WITH DIFFERENTIAL/PLATELET
Basophils Absolute: 0.1 10*3/uL (ref 0.0–0.2)
Basos: 1 %
EOS (ABSOLUTE): 0.1 10*3/uL (ref 0.0–0.4)
Eos: 1 %
Hematocrit: 46.4 % (ref 34.0–46.6)
Hemoglobin: 15.2 g/dL (ref 11.1–15.9)
Immature Grans (Abs): 0 10*3/uL (ref 0.0–0.1)
Immature Granulocytes: 0 %
Lymphocytes Absolute: 2.7 10*3/uL (ref 0.7–3.1)
Lymphs: 32 %
MCH: 27.1 pg (ref 26.6–33.0)
MCHC: 32.8 g/dL (ref 31.5–35.7)
MCV: 83 fL (ref 79–97)
Monocytes Absolute: 0.5 10*3/uL (ref 0.1–0.9)
Monocytes: 5 %
Neutrophils Absolute: 5.2 10*3/uL (ref 1.4–7.0)
Neutrophils: 61 %
Platelets: 236 10*3/uL (ref 150–450)
RBC: 5.6 x10E6/uL — ABNORMAL HIGH (ref 3.77–5.28)
RDW: 13.1 % (ref 11.7–15.4)
WBC: 8.6 10*3/uL (ref 3.4–10.8)

## 2021-12-08 LAB — COMPREHENSIVE METABOLIC PANEL
ALT: 6 IU/L (ref 0–32)
AST: 23 IU/L (ref 0–40)
Albumin/Globulin Ratio: 1.3 (ref 1.2–2.2)
Albumin: 4.4 g/dL (ref 3.8–4.8)
Alkaline Phosphatase: 132 IU/L — ABNORMAL HIGH (ref 44–121)
BUN/Creatinine Ratio: 13 (ref 12–28)
BUN: 12 mg/dL (ref 8–27)
Bilirubin Total: 0.4 mg/dL (ref 0.0–1.2)
CO2: 27 mmol/L (ref 20–29)
Calcium: 9.8 mg/dL (ref 8.7–10.3)
Chloride: 101 mmol/L (ref 96–106)
Creatinine, Ser: 0.94 mg/dL (ref 0.57–1.00)
Globulin, Total: 3.4 g/dL (ref 1.5–4.5)
Glucose: 90 mg/dL (ref 70–99)
Potassium: 3.9 mmol/L (ref 3.5–5.2)
Sodium: 140 mmol/L (ref 134–144)
Total Protein: 7.8 g/dL (ref 6.0–8.5)
eGFR: 67 mL/min/{1.73_m2} (ref >59)

## 2021-12-08 LAB — LIPID PANEL
Chol/HDL Ratio: 4.5 ratio — ABNORMAL HIGH (ref 0.0–4.4)
Cholesterol, Total: 212 mg/dL — ABNORMAL HIGH (ref 100–199)
HDL: 47 mg/dL (ref >39)
LDL Chol Calc (NIH): 147 mg/dL — ABNORMAL HIGH (ref 0–99)
Triglycerides: 98 mg/dL (ref 0–149)
VLDL Cholesterol Cal: 18 mg/dL (ref 5–40)

## 2021-12-08 LAB — T4, FREE: Free T4: 1.55 ng/dL (ref 0.82–1.77)

## 2021-12-08 LAB — TSH: TSH: 0.329 u[IU]/mL — ABNORMAL LOW (ref 0.450–4.500)

## 2021-12-10 ENCOUNTER — Other Ambulatory Visit: Payer: Self-pay | Admitting: Internal Medicine

## 2021-12-10 MED ORDER — LEVOTHYROXINE SODIUM 75 MCG PO TABS: 75.0000 ug | ORAL_TABLET | Freq: Every day | ORAL | 3 refills | Status: DC

## 2021-12-10 NOTE — Progress Notes (Signed)
Pl let her know I am reducing dose of thyroid meds. Labs abnl Thnx.

## 2022-01-02 DIAGNOSIS — I4891 Unspecified atrial fibrillation: Secondary | ICD-10-CM | POA: Diagnosis not present

## 2022-02-05 IMAGING — DX DG CHEST 1V
1 series · 1 of 1 positions shown · non-contrast
Comparison: Prior CTA from 07/28/2020 and radiograph from
07/24/2020.

CLINICAL DATA: Initial evaluation for acute tachycardia. History of
COVID pneumonia.

EXAM:
CHEST  1 VIEW

[chest ap]
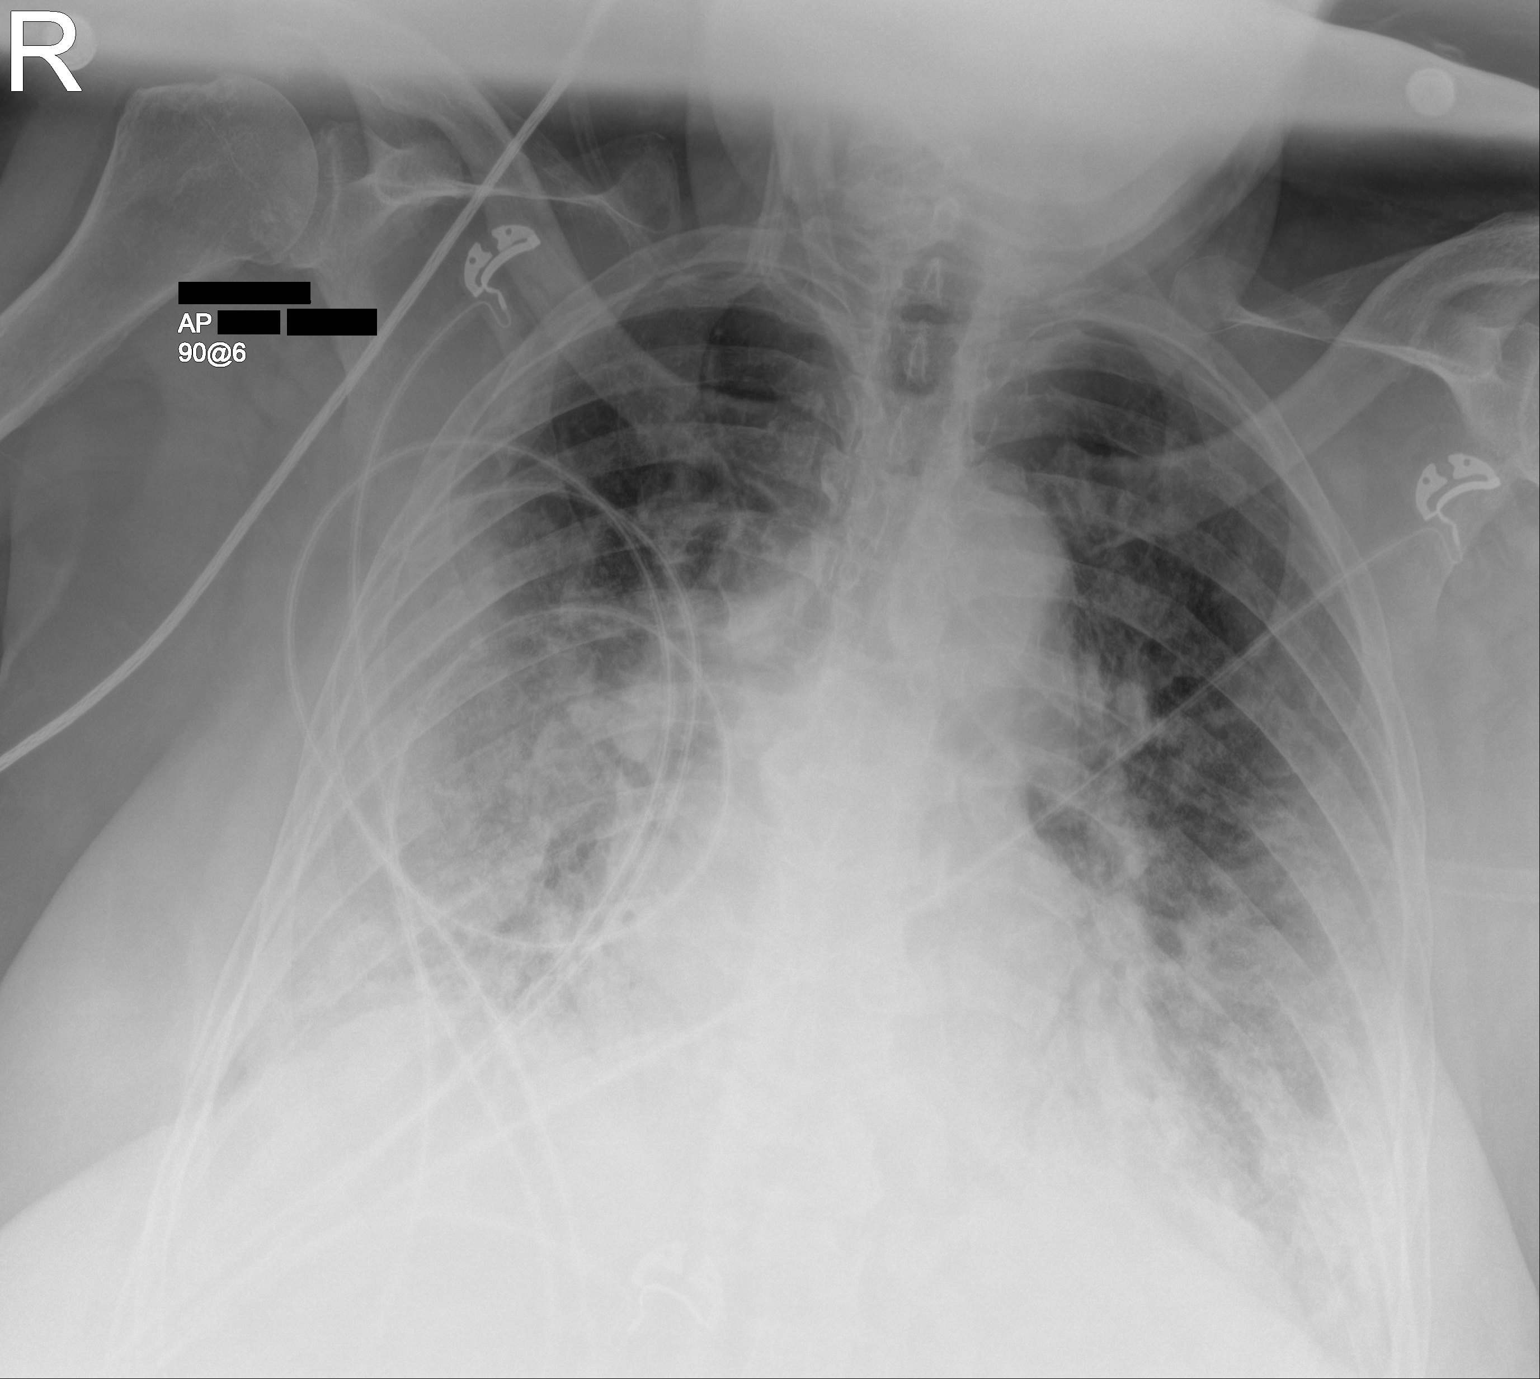

[1 of 1 positions shown; findings below may reference images not displayed]

FINDINGS: Transverse heart size stable. Mediastinal silhouette within normal
limits.

Lungs mildly hypoinflated. Severe diffuse bilateral airspace
disease, right greater than left, progressed and worsened as
compared to prior radiograph from 07/24/2020. Scattered air
bronchograms noted at the lung bases bilaterally. Underlying
pulmonary vascular congestion without overt pulmonary edema. No
visible pleural effusion. No pneumothorax.

Osseous structures unchanged.
IMPRESSION: Severe diffuse bilateral airspace disease, right greater than left,
progressed and worsened as compared to previous, concerning for
worsened multifocal pneumonia.

## 2022-02-06 ENCOUNTER — Ambulatory Visit: Payer: Self-pay | Admitting: *Deleted

## 2022-02-06 NOTE — Telephone Encounter (Signed)
Summary: Pt stated she has a question for the nurse but no further details given  ? Pt stated she has a question for the nurse. Pt declined to give more details and requested that a nurse return her call. Cb# [phone]   ?  ?Pt called to ask if she should still take iron supplement now that her blood level has improved. Pt has an upcoming appt. She was advised to discuss with Dr. Neomia Dear. ? ?Reason for Disposition ? General information question, no triage required and triager able to answer question ? ?Answer Assessment - Initial Assessment Questions ?1. REASON FOR CALL or QUESTION: "What is your reason for calling today?" or "How can I best help you?" or "What question do you have that I can help answer?" ?    Lab results, her hgb has improved and she wants to know if she should stop taking her iron supplement. ? ?Protocols used: Information Only Call - No Triage-A-AH ? ?

## 2022-02-07 IMAGING — DX DG CHEST 1V PORT
1 series · 1 of 1 positions shown · non-contrast
Comparison: July 30, 2020.

CLINICAL DATA: Orogastric tube placement

EXAM:
PORTABLE CHEST 1 VIEW

[chest ap]
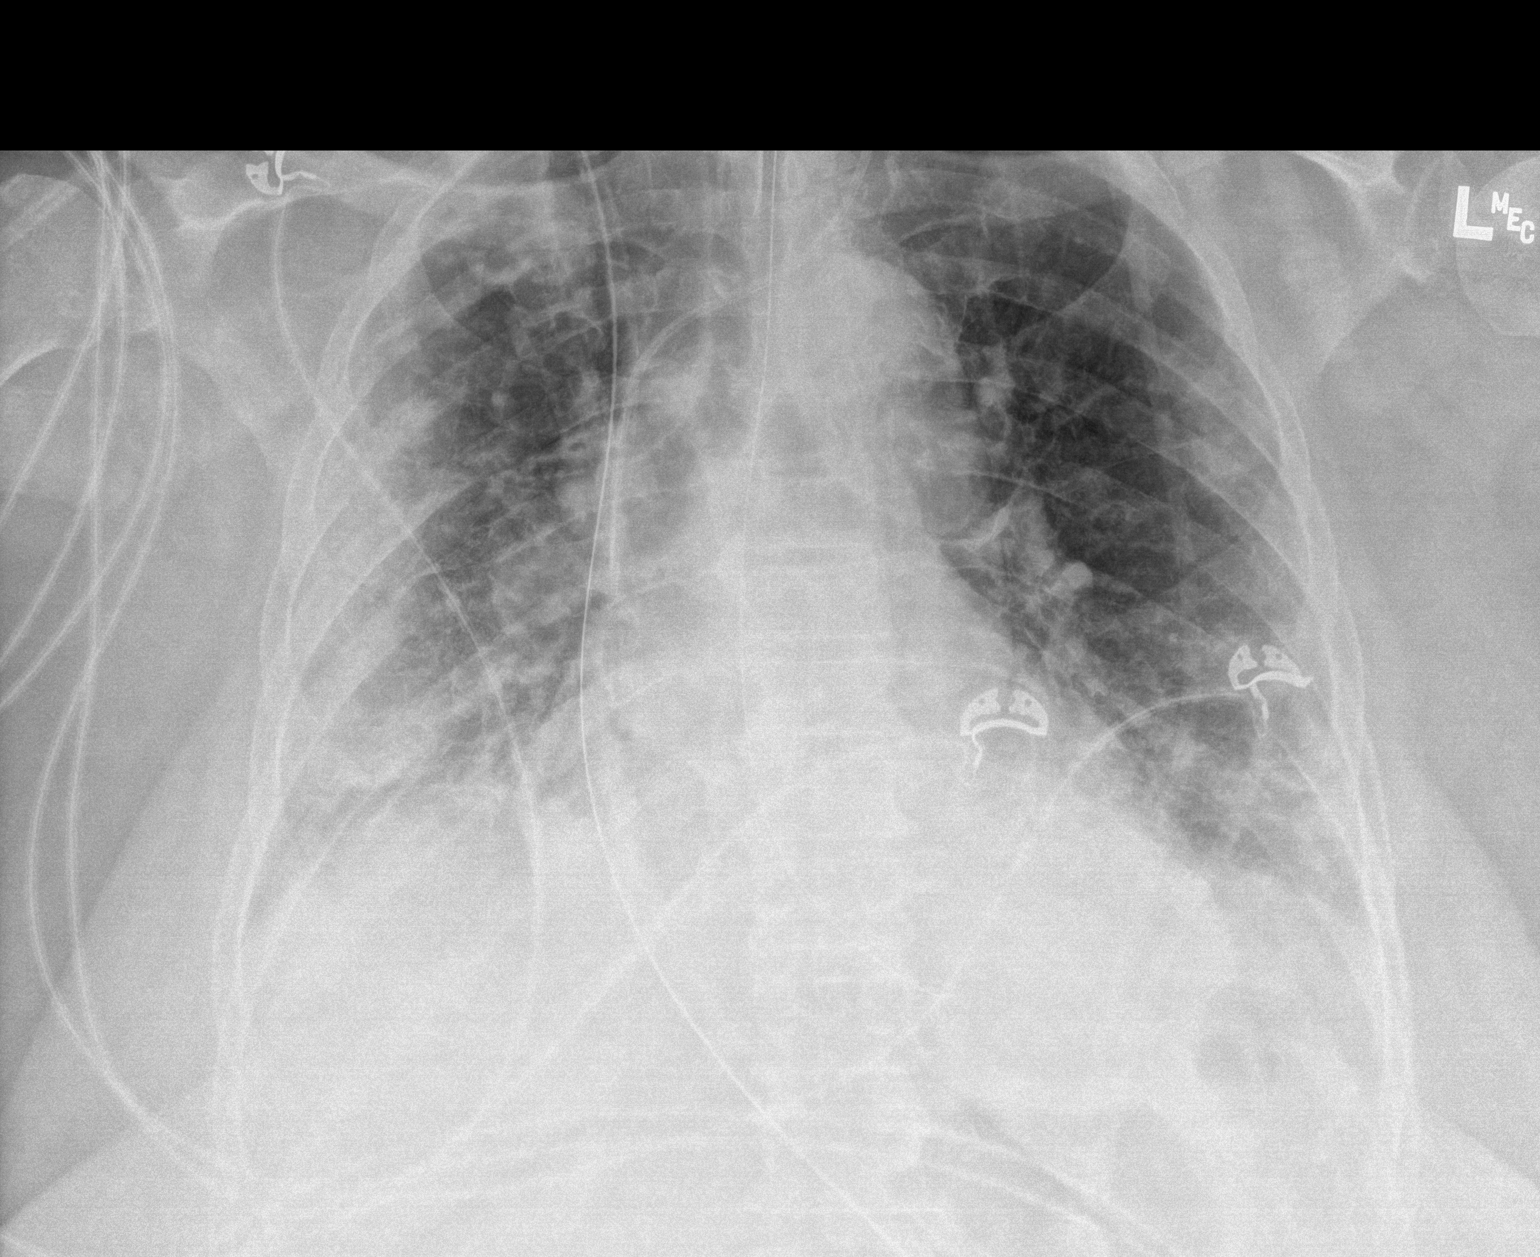

[1 of 1 positions shown; findings below may reference images not displayed]

FINDINGS: Endotracheal tube tip is 6.0 cm above carina. Orogastric tube tip is
at the gastroesophageal junction. Left jugular catheter tip is in
the superior vena cava. No pneumothorax. Patchy airspace opacity is
present bilaterally, primarily in the periphery of the upper and
lower lung regions, essentially stable. Heart is upper normal in
size with pulmonary vascularity normal. No adenopathy. No bone
lesions.
IMPRESSION: Tube and catheter positions as described without pneumothorax. Note
that the orogastric tube tip is at the gastroesophageal junction.
Advise advancing orogastric tube 8-10 cm. Patchy airspace opacity
bilaterally, stable. Stable cardiac silhouette.

## 2022-02-08 IMAGING — DX DG ABDOMEN 1V
2 series · 2 of 2 positions shown · non-contrast
Comparison: Earlier today

CLINICAL DATA: NG placement.

EXAM:
ABDOMEN - 1 VIEW

[abdomen supine (1 of 2)]
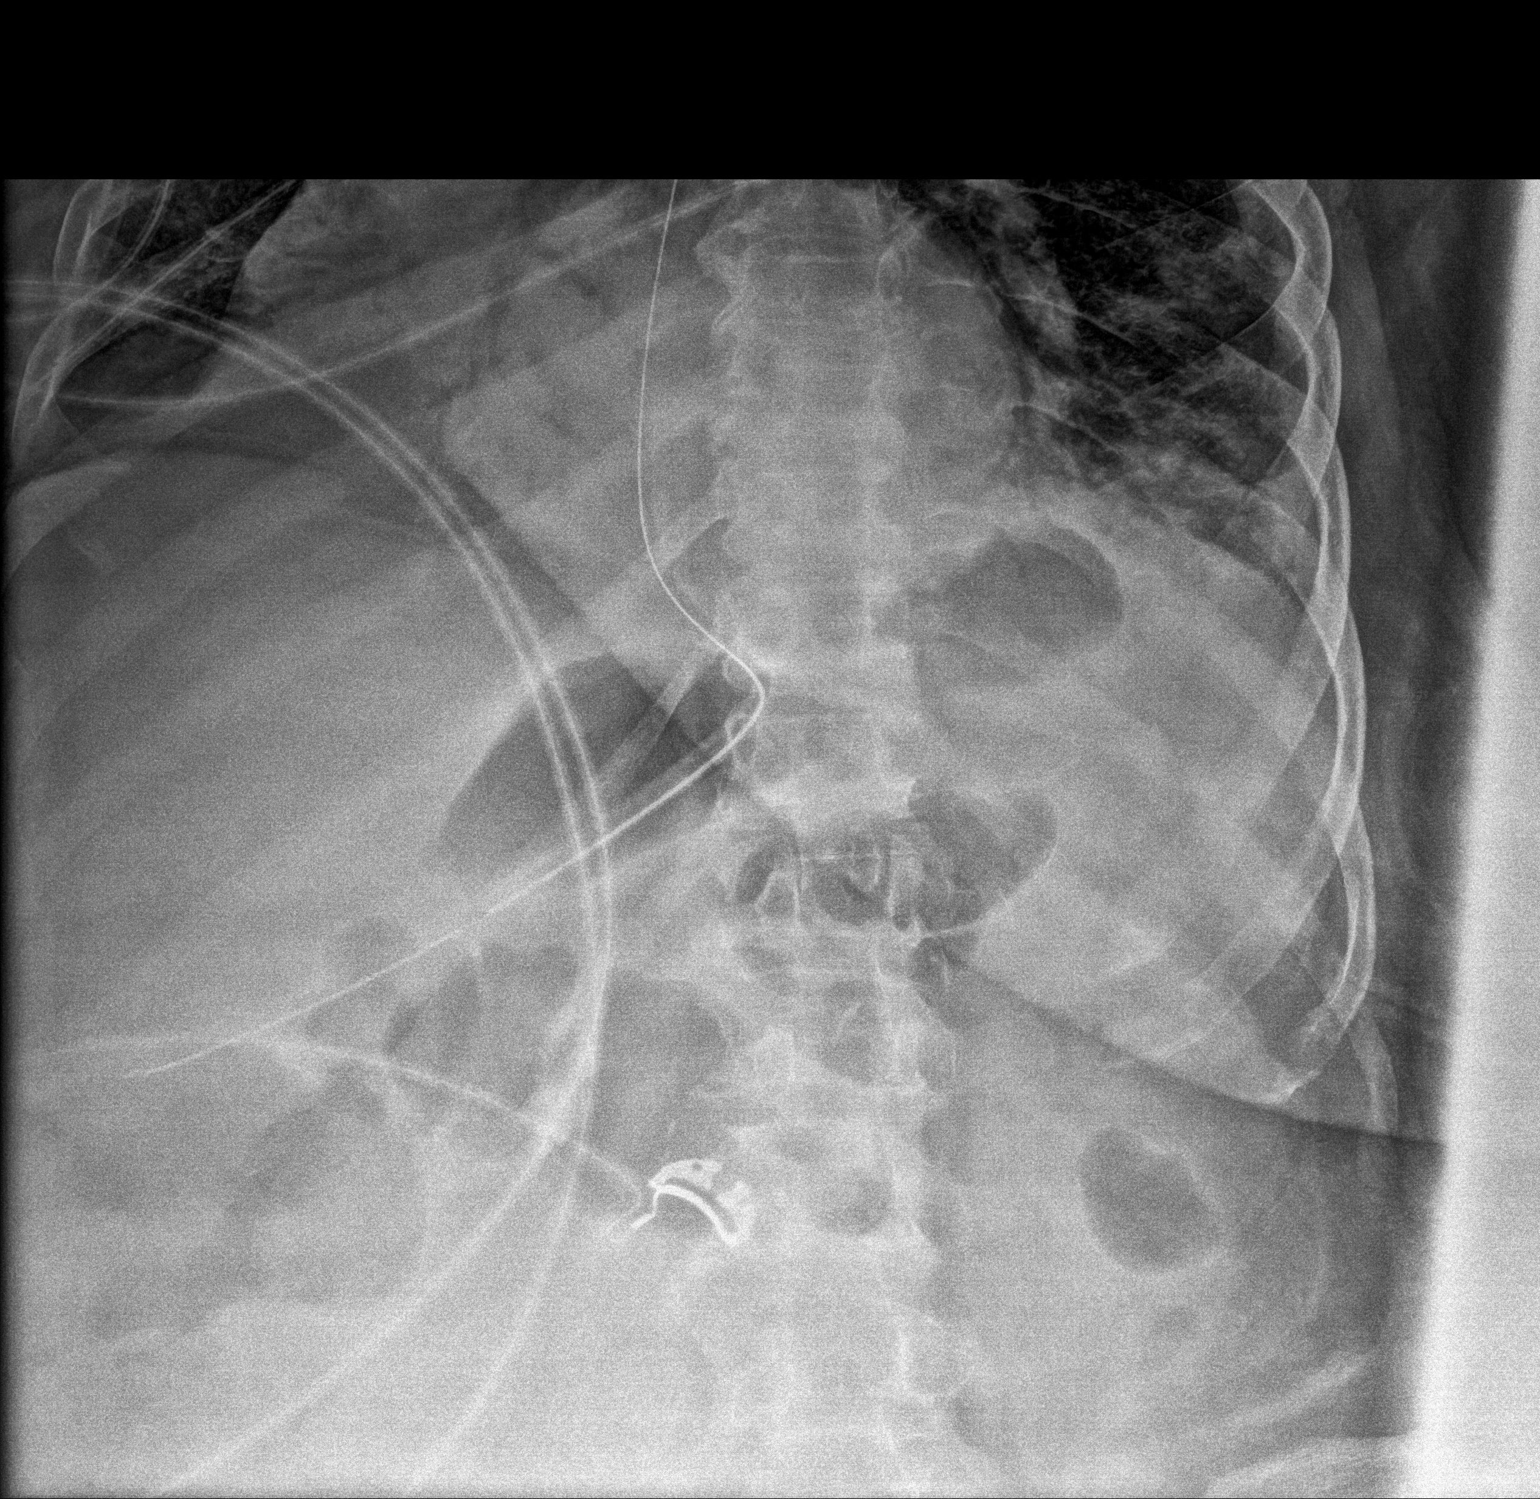

[abdomen supine (2 of 2)]
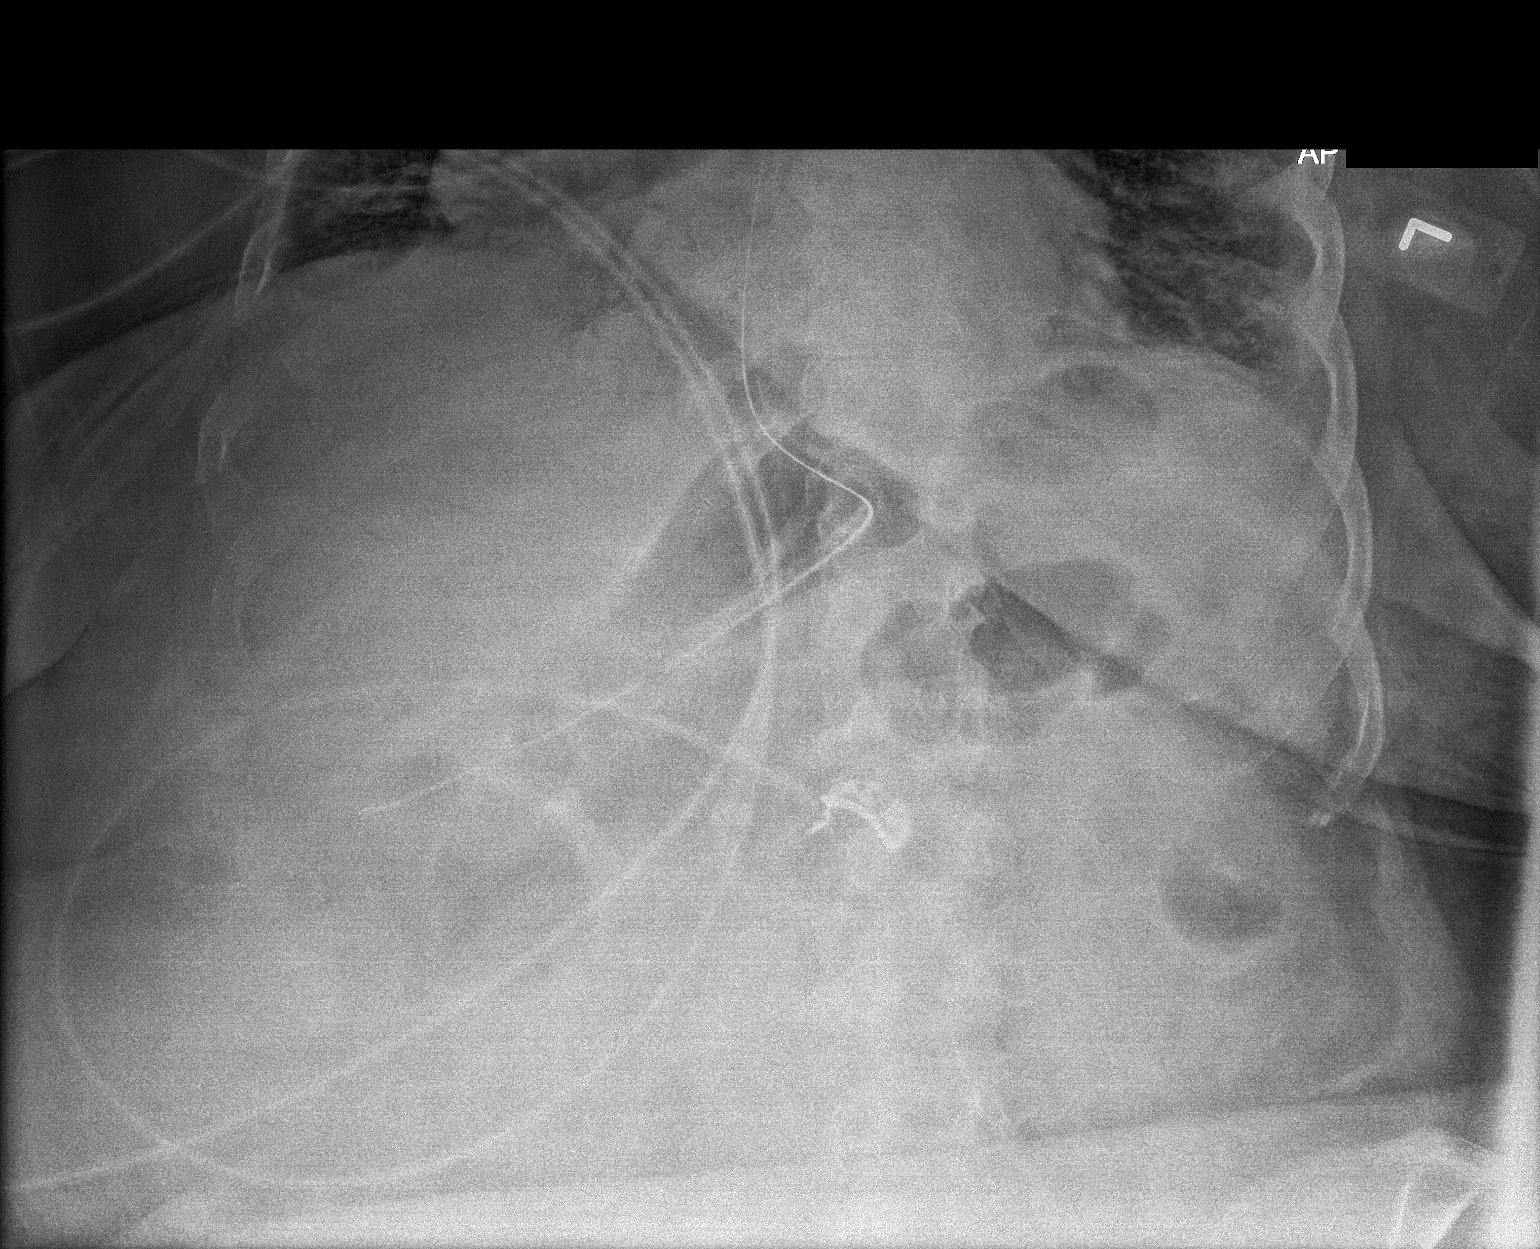

[2 of 2 positions shown; findings below may reference images not displayed]

FINDINGS: Tip and side port of the enteric tube are now located below the
diaphragm in the stomach. Similar bowel-gas pattern tear earlier
today.
IMPRESSION: Tip and side port of the enteric tube below the diaphragm in the
stomach.

## 2022-02-14 IMAGING — DX DG CHEST 1V PORT
1 series · 1 of 1 positions shown · non-contrast
Comparison: Abdominal x-ray dated August 01, 2020. Chest x-ray
dated July 31, 2020.

CLINICAL DATA: HHKIH-7S pneumonia.

EXAM:
PORTABLE CHEST 1 VIEW

[chest ap]
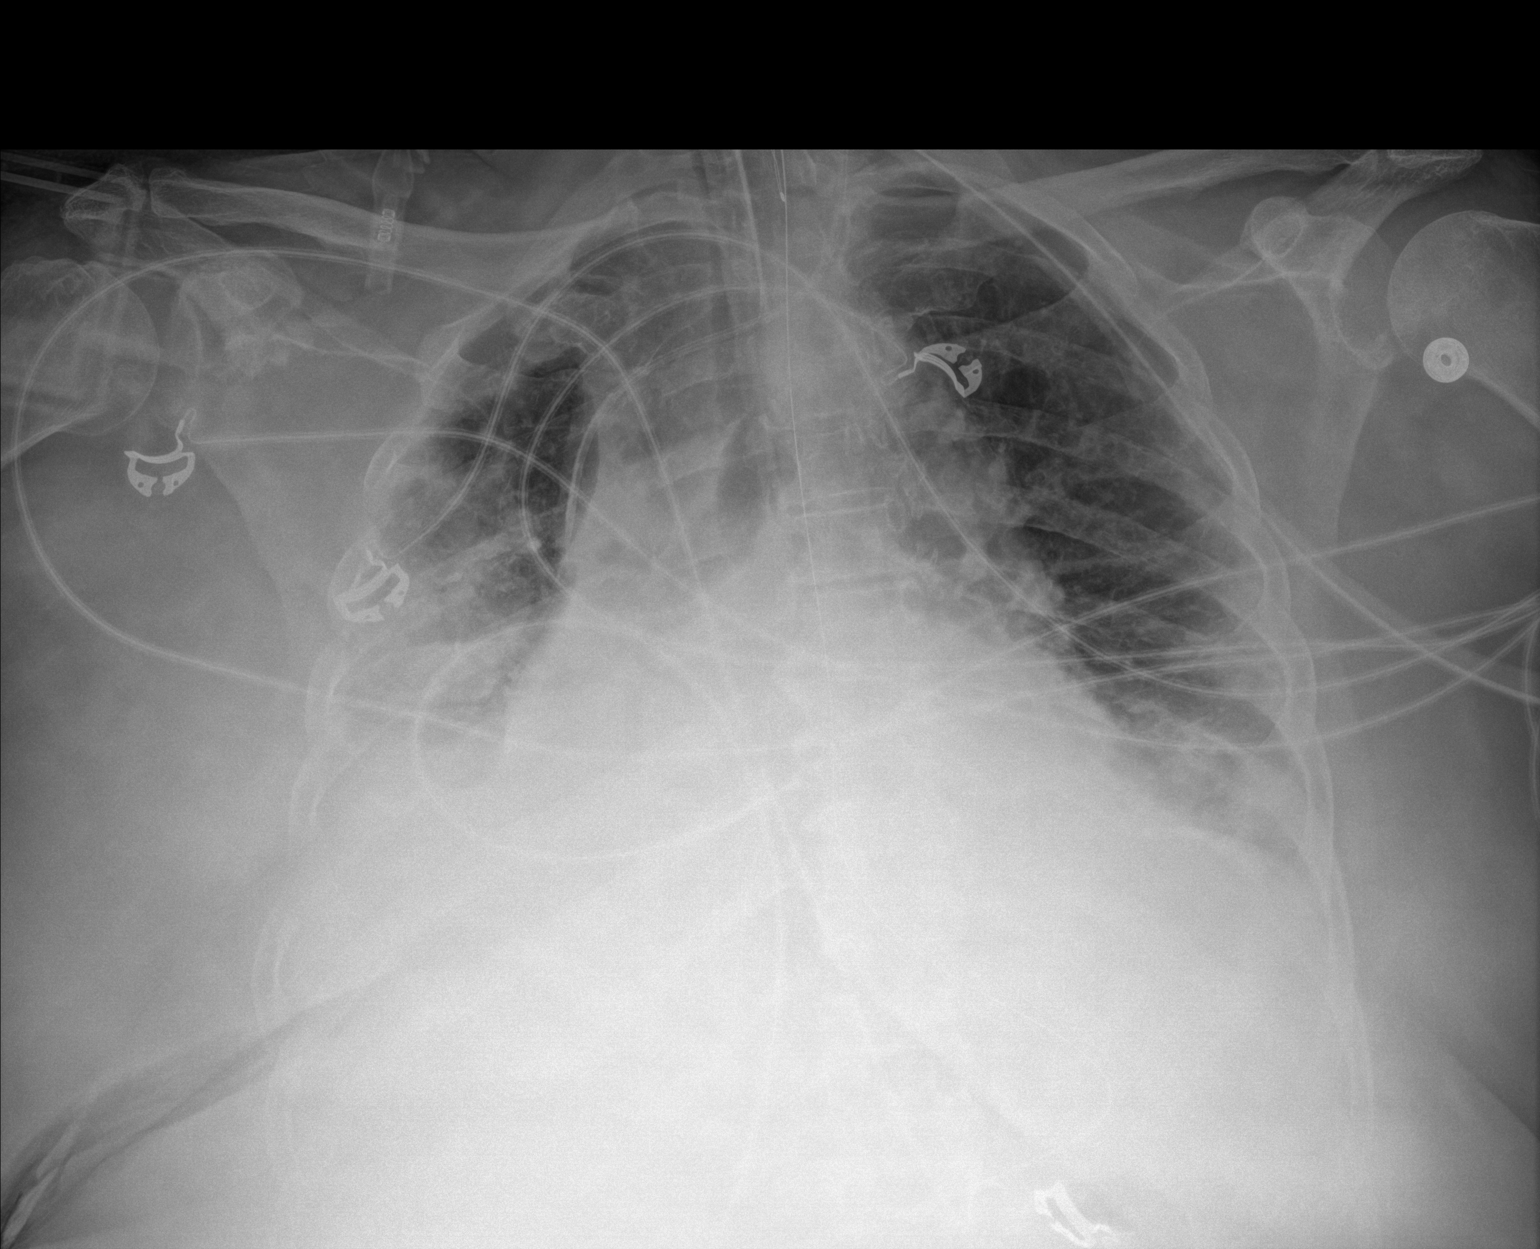

[1 of 1 positions shown; findings below may reference images not displayed]

FINDINGS: Unchanged endotracheal and enteric tubes. Unchanged left internal
jugular central venous catheter. Stable cardiomediastinal
silhouette. Poor inspiratory effort compared to the prior study.
Peripheral and basilar predominant patchy airspace disease in the
right greater than left lungs is not significantly changed. No
pneumothorax or pleural effusion. No acute osseous abnormality.
IMPRESSION: 1. Unchanged multifocal pneumonia.

## 2022-02-18 IMAGING — DX DG ABDOMEN 1V
1 series · 1 of 1 positions shown · non-contrast
Comparison: Radiograph 08/01/2020

CLINICAL DATA: OG tube placement

EXAM:
ABDOMEN - 1 VIEW

[abdomen supine]
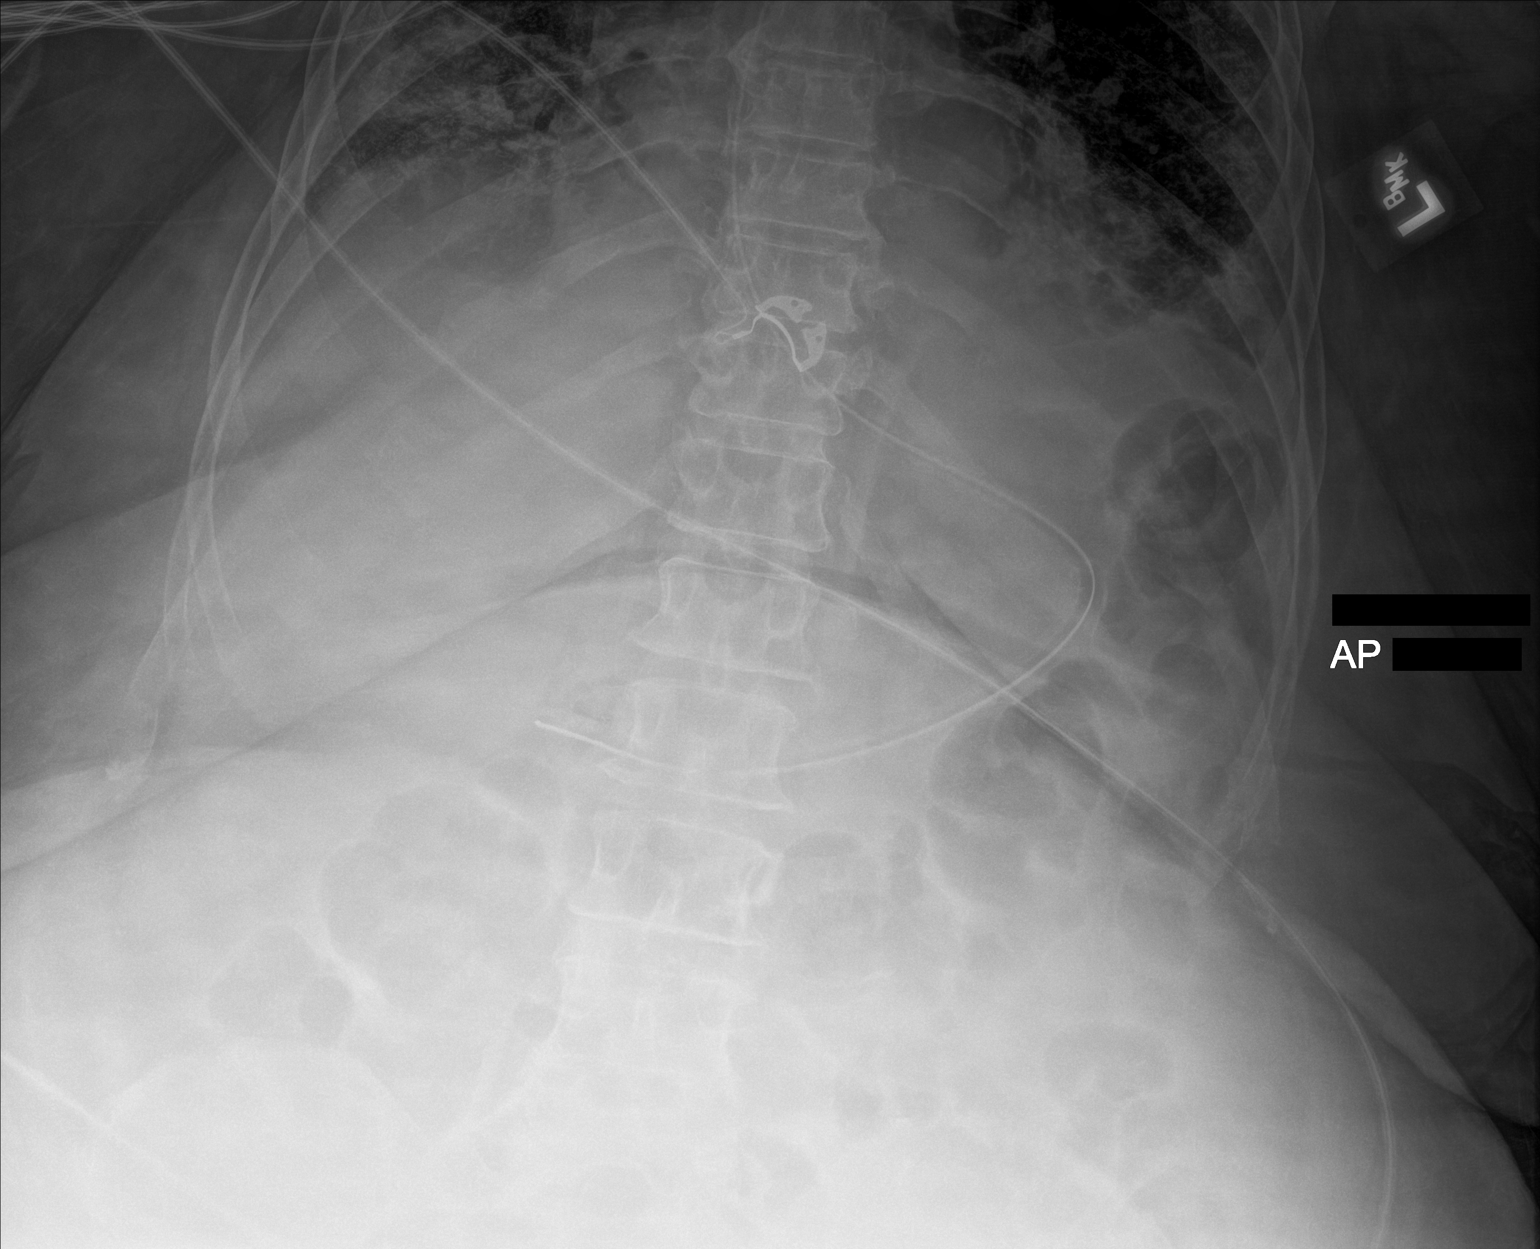

[1 of 1 positions shown; findings below may reference images not displayed]

FINDINGS: Transesophageal tube tip and side port distal to the GE junction,
terminating at the level of the gastric antrum/proximal duodenum
bulb. Persistent heterogeneous airspace opacities present in the
lung bases likely with associated atelectasis and trace effusions.
Multiple air-filled loops of small bowel and colon without frank
dilatation or high-grade obstructive pattern. No suspicious
calcifications. Likely edematous changes in the soft tissues. No
acute osseous abnormality.
IMPRESSION: 1. Transesophageal tube tip and side port distal to the GE junction,
terminating at the level of the gastric antrum/proximal duodenum
bulb.
2. Persistent bibasilar heterogeneous airspace opacities likely with
atelectasis and trace effusions.

## 2022-02-27 ENCOUNTER — Other Ambulatory Visit: Payer: Self-pay | Admitting: Internal Medicine

## 2022-02-27 NOTE — Telephone Encounter (Signed)
Medication Refill - Medication: lisinopril (ZESTRIL) 40 MG / has 1 pill left and appt next Friday  ? ?Has the patient contacted their pharmacy? Yes.   ?(Agent: If no, request that the patient contact the pharmacy for the refill. If patient does not wish to contact the pharmacy document the reason why and proceed with request.) ?(Agent: If yes, when and what did the pharmacy advise?) ?Not able to get in contact with pharmacy after calling  ?Preferred Pharmacy (with phone number or street name): [facility] (N), [place] [address]  ?[place], [place] ([place]) [place] [address]  ?Phone:  [phone]  Fax:  [phone]  ?Has the patient been seen for an appointment in the last year OR does the patient have an upcoming appointment? Yes.   ? ?Agent: Please be advised that RX refills may take up to 3 business days. We ask that you follow-up with your pharmacy. ?

## 2022-02-28 ENCOUNTER — Other Ambulatory Visit: Payer: Self-pay | Admitting: Internal Medicine

## 2022-02-28 MED ORDER — LISINOPRIL 40 MG PO TABS: 40.0000 mg | ORAL_TABLET | Freq: Every day | ORAL | 0 refills | Status: DC

## 2022-02-28 NOTE — Telephone Encounter (Signed)
Requested Prescriptions  ?Pending Prescriptions Disp Refills  ?? lisinopril (ZESTRIL) 40 MG tablet 90 tablet 0  ?  Sig: Take 1 tablet (40 mg total) by mouth daily.  ?  ? Cardiovascular:  ACE Inhibitors Failed - 02/27/2022  9:43 AM  ?  ?  Failed - Last BP in normal range  ?  BP Readings from Last 1 Encounters:  ?12/07/21 (!) 160/82  ?   ?  ?  Passed - Cr in normal range and within 180 days  ?  Creatinine, Ser  ?Date Value Ref Range Status  ?12/07/2021 0.94 0.57 - 1.00 mg/dL Final  ?   ?  ?  Passed - K in normal range and within 180 days  ?  Potassium  ?Date Value Ref Range Status  ?12/07/2021 3.9 3.5 - 5.2 mmol/L Final  ?   ?  ?  Passed - Patient is not pregnant  ?  ?  Passed - Valid encounter within last 6 months  ?  Recent Outpatient Visits   ?      ? 2 months ago Hypothyroidism, unspecified type  ? [facility] Vigg, Avanti, MD  ? 7 months ago Need for influenza vaccination  ? [facility] Vigg, Avanti, MD  ? 11 months ago Hypothyroidism, unspecified type  ? [facility] Vigg, Avanti, MD  ? 1 year ago Hyperlipidemia, unspecified hyperlipidemia type  ? [facility] Vigg, Avanti, MD  ? 1 year ago Acquired hypothyroidism  ? [facility] Vigg, Avanti, MD  ?  ?  ?Future Appointments   ?        ? In 1 week Vigg, Avanti, MD [facility]  ? In 5 months  [place]  ?  ? ?  ?  ?  ? ?

## 2022-03-01 NOTE — Telephone Encounter (Signed)
Refused duplicate refill request for lisinopril 40 mg because it was approved on 02/28/2022 at the correct pharmacy at 11:17 AM for #90, 0 refills. ?

## 2022-03-04 ENCOUNTER — Telehealth: Payer: Self-pay

## 2022-03-04 NOTE — Chronic Care Management (AMB) (Signed)
? ? ?Chronic Care Management ?Pharmacy Assistant  ? ?Name: Courtney Anderson  MRN: [id number] DOB: 1956-08-24 ? ?Reason for Encounter:Assessment call for General call ? ?Recent office visits:  ?12/07/21-Courtney Vigg, MD (PCP) Seen for hypertension and Hyperlipidemia. Labs ordered. Follow up in 3 months.  ? ?Recent consult visits:  ?None noted ? ?Hospital visits:  ?None in previous 6 months ? ?Medications: ?Outpatient Encounter Medications as of 03/04/2022  ?Medication Sig  ? levothyroxine (EUTHYROX) 75 MCG tablet Take 1 tablet (75 mcg total) by mouth daily before breakfast.  ? amLODipine (NORVASC) 10 MG tablet Take 1 tablet (10 mg total) by mouth daily.  ? Cholecalciferol (VITAMIN D) 50 MCG (2000 UT) tablet Take 2,000 Units by mouth daily.  ? Iron-Vitamin C 65-125 MG TABS Take 1 tablet by mouth in the morning and at bedtime. Will recheck iron level next visit.  ? lisinopril (ZESTRIL) 40 MG tablet Take 1 tablet (40 mg total) by mouth daily.  ? Multiple Vitamins-Minerals (ONE-A-DAY WOMENS 50+ ADVANTAGE) TABS Take 1 tablet by mouth daily.  ? ?No facility-administered encounter medications on file as of 03/04/2022.  ? ?Contacted Courtney Anderson for General Review Call ? ? ?Chart Review: ? ?Have there been any documented new, changed, or discontinued medications since last visit? No (If yes, include name, dose, frequency, date) ? ?Has there been any documented recent hospitalizations or ED visits since last visit with Clinical Pharmacist? No ? ? ?Adherence Review: ? ?Does the Clinical Pharmacist Assistant have access to adherence rates? Yes ? ?Does the patient have >5 day gap between last estimated fill dates for any of the above medications or other medication gaps? No ? ?Reason for medication gaps. N/a ? ? ?Disease State Questions: ? ?Able to connect with Patient? Yes ? ?Did patient have any problems with their health recently? No ? ?Have you had any admissions or emergency room visits or worsening of your condition(s) since  last visit? No ?    Details of ED visit, hospital visit and/or worsening condition(s):N/a ? ?Have you had any visits with new specialists or providers since your last visit? No ?Explain:N/a ? ?Have you had any new health care problem(s) since your last visit? No ?   New problem(s) reported:N/a ? ?Have you run out of any of your medications since you last spoke with clinical pharmacist? No ?What caused you to run out of your medications?N/a ? ?Are there any medications you are not taking as prescribed? No ?What kept you from taking your medications as prescribed?N/a ? ?Are you having any issues or side effects with your medications? No ?Note of issues or side effects:N/a ? ?Do you have any other health concerns or questions you want to discuss with your Clinical Pharmacist before your next visit? No ? ?Are there any health concerns that you feel we can do a better job addressing? No ?     Note Patient's response. Patient states there is nothing at this time. ? ?Are you having any problems with any of the following since the last visit:None ? ?12. Any falls since last visit? No ?  ?13. Any increased or uncontrolled pain since last visit? No ?  ?14. Next visit Type: Office visit ?      Visit with:Courtney Vigg, MD ?       Date:03/08/22 ?       Time:10:00 am ? ?15. Additional Details? No  ? ? ?Care Gaps: ?Zoster Vaccines:Never done ?MAMMOGRAM:Last completed: Oct 27, 2008 ?TETANUS/TDAP:Last completed: Aug 24, 2007 ?  DEXA SCAN:Never done ? ?Star Rating Drugs: ?Lisinopril 40 mg Last filled:11/30/21 90 DS ? ?Courtney Anderson, RMA ?Health Concierge ? ?

## 2022-03-07 ENCOUNTER — Other Ambulatory Visit: Payer: Self-pay | Admitting: Internal Medicine

## 2022-03-07 DIAGNOSIS — E039 Hypothyroidism, unspecified: Secondary | ICD-10-CM

## 2022-03-07 NOTE — Telephone Encounter (Signed)
Medication Refill - Medication:  ?amLODipine (NORVASC) 10 MG tablet ?levothyroxine (EUTHYROX) 75 MCG tablet  ? ?Has the patient contacted their pharmacy? Yes.   ?Contact PCP ? ?Preferred Pharmacy (with phone number or street name):  ?[facility] ([facility]), [place] [facility] [place]  ?[place], [place] ([place]) [place] [address]  ?Phone:  [phone]  Fax:  [phone]  ? ?Has the patient been seen for an appointment in the last year OR does the patient have an upcoming appointment? Yes.   ? ?Agent: Please be advised that RX refills may take up to 3 business days. We ask that you follow-up with your pharmacy. ?

## 2022-03-07 NOTE — Telephone Encounter (Signed)
Amlodipine refilled 12/07/2021 #30 4 refills - should last until 05/2022. ?Levothyroxine refilled 12/10/2021 #30 3 refills  -should last until 04/2022 ?Requested Prescriptions  ?Pending Prescriptions Disp Refills  ?? amLODipine (NORVASC) 10 MG tablet 30 tablet 4  ?  Sig: Take 1 tablet (10 mg total) by mouth daily.  ?  ? Cardiovascular: Calcium Channel Blockers 2 Failed - 03/07/2022 10:21 AM  ?  ?  Failed - Last BP in normal range  ?  BP Readings from Last 1 Encounters:  ?12/07/21 (!) 160/82  ?   ?  ?  Passed - Last Heart Rate in normal range  ?  Pulse Readings from Last 1 Encounters:  ?12/07/21 76  ?   ?  ?  Passed - Valid encounter within last 6 months  ?  Recent Outpatient Visits   ?      ? 3 months ago Hypothyroidism, unspecified type  ? [facility] Vigg, Avanti, MD  ? 7 months ago Need for influenza vaccination  ? [facility] Vigg, Avanti, MD  ? 11 months ago Hypothyroidism, unspecified type  ? [facility] Vigg, Avanti, MD  ? 1 year ago Hyperlipidemia, unspecified hyperlipidemia type  ? [facility] Vigg, Avanti, MD  ? 1 year ago Acquired hypothyroidism  ? [facility] Vigg, Avanti, MD  ?  ?  ?Future Appointments   ?        ? In 1 month Wynetta Emery, Barb Merino, DO [employer], [facility]  ? In 5 months  [place], [facility]  ?  ? ?  ?  ?  ?? levothyroxine (EUTHYROX) 75 MCG tablet 30 tablet 3  ?  Sig: Take 1 tablet (75 mcg total) by mouth daily before breakfast.  ?  ? Endocrinology:  Hypothyroid Agents Failed - 03/07/2022 10:21 AM  ?  ?  Failed - TSH in normal range and within 360 days  ?  TSH  ?Date Value Ref Range Status  ?12/07/2021 0.329 (L) 0.450 - 4.500 uIU/mL Final  ?   ?  ?  Passed - Valid encounter within last 12 months  ?  Recent Outpatient Visits   ?      ? 3 months ago Hypothyroidism, unspecified type  ? [facility] Vigg, Avanti, MD  ? 7 months ago Need for influenza vaccination  ? [facility] Vigg, Avanti, MD  ? 11  months ago Hypothyroidism, unspecified type  ? [facility] Vigg, Avanti, MD  ? 1 year ago Hyperlipidemia, unspecified hyperlipidemia type  ? [facility] Vigg, Avanti, MD  ? 1 year ago Acquired hypothyroidism  ? [facility] Vigg, Avanti, MD  ?  ?  ?Future Appointments   ?        ? In 1 month Wynetta Emery, Barb Merino, DO [employer], [facility]  ? In 5 months  [place], [facility]  ?  ? ?  ?  ?  ? ?

## 2022-03-08 ENCOUNTER — Ambulatory Visit: Payer: Medicare Other | Admitting: Internal Medicine

## 2022-03-12 ENCOUNTER — Other Ambulatory Visit: Payer: Self-pay | Admitting: Internal Medicine

## 2022-03-12 DIAGNOSIS — E039 Hypothyroidism, unspecified: Secondary | ICD-10-CM

## 2022-03-13 NOTE — Telephone Encounter (Signed)
Requested Prescriptions  ?Pending Prescriptions Disp Refills  ?? levothyroxine (SYNTHROID) 75 MCG tablet [Pharmacy Med Name: Levothyroxine Sodium 75 MCG Oral Tablet] 90 tablet 0  ?  Sig: TAKE 1 TABLET BY MOUTH ONCE DAILY BEFORE BREAKFAST  ?  ? Endocrinology:  Hypothyroid Agents Failed - 03/12/2022 11:43 AM  ?  ?  Failed - TSH in normal range and within 360 days  ?  TSH  ?Date Value Ref Range Status  ?12/07/2021 0.329 (L) 0.450 - 4.500 uIU/mL Final  ?   ?  ?  Passed - Valid encounter within last 12 months  ?  Recent Outpatient Visits   ?      ? 3 months ago Hypothyroidism, unspecified type  ? [facility] Vigg, Avanti, MD  ? 7 months ago Need for influenza vaccination  ? [facility] Vigg, Avanti, MD  ? 11 months ago Hypothyroidism, unspecified type  ? [facility] Vigg, Avanti, MD  ? 1 year ago Hyperlipidemia, unspecified hyperlipidemia type  ? [facility] Vigg, Avanti, MD  ? 1 year ago Acquired hypothyroidism  ? [facility] Vigg, Avanti, MD  ?  ?  ?Future Appointments   ?        ? In 1 month Wynetta Emery, Barb Merino, DO [employer], [facility]  ? In 4 months  [place], [facility]  ?  ? ?  ?  ?  ?? amLODipine (NORVASC) 10 MG tablet [Pharmacy Med Name: amLODIPine Besylate 10 MG Oral Tablet] 90 tablet 0  ?  Sig: Take 1 tablet by mouth once daily  ?  ? Cardiovascular: Calcium Channel Blockers 2 Failed - 03/12/2022 11:43 AM  ?  ?  Failed - Last BP in normal range  ?  BP Readings from Last 1 Encounters:  ?12/07/21 (!) 160/82  ?   ?  ?  Passed - Last Heart Rate in normal range  ?  Pulse Readings from Last 1 Encounters:  ?12/07/21 76  ?   ?  ?  Passed - Valid encounter within last 6 months  ?  Recent Outpatient Visits   ?      ? 3 months ago Hypothyroidism, unspecified type  ? [facility] Vigg, Avanti, MD  ? 7 months ago Need for influenza vaccination  ? [facility] Vigg, Avanti, MD  ? 11 months ago Hypothyroidism, unspecified type   ? [facility] Vigg, Avanti, MD  ? 1 year ago Hyperlipidemia, unspecified hyperlipidemia type  ? [facility] Vigg, Avanti, MD  ? 1 year ago Acquired hypothyroidism  ? [facility] Vigg, Avanti, MD  ?  ?  ?Future Appointments   ?        ? In 1 month Wynetta Emery, Barb Merino, DO [employer], [facility]  ? In 4 months  [place], [facility]  ?  ? ?  ?  ?  ? ?

## 2022-04-05 DIAGNOSIS — E559 Vitamin D deficiency, unspecified: Secondary | ICD-10-CM | POA: Diagnosis not present

## 2022-04-05 DIAGNOSIS — E042 Nontoxic multinodular goiter: Secondary | ICD-10-CM | POA: Diagnosis not present

## 2022-04-05 DIAGNOSIS — E213 Hyperparathyroidism, unspecified: Secondary | ICD-10-CM | POA: Diagnosis not present

## 2022-04-05 DIAGNOSIS — E890 Postprocedural hypothyroidism: Secondary | ICD-10-CM | POA: Diagnosis not present

## 2022-04-26 ENCOUNTER — Ambulatory Visit: Payer: Medicare Other | Admitting: Family Medicine

## 2022-05-20 ENCOUNTER — Ambulatory Visit (INDEPENDENT_AMBULATORY_CARE_PROVIDER_SITE_OTHER): Payer: Medicare Other

## 2022-05-20 DIAGNOSIS — E782 Mixed hyperlipidemia: Secondary | ICD-10-CM

## 2022-05-20 DIAGNOSIS — E039 Hypothyroidism, unspecified: Secondary | ICD-10-CM

## 2022-05-20 DIAGNOSIS — I10 Essential (primary) hypertension: Secondary | ICD-10-CM

## 2022-05-20 NOTE — Progress Notes (Signed)
Chronic Care Management Pharmacy Note  05/20/2022 Name:  Courtney Anderson MRN:  [id number] DOB:  [birth year]  Summary: -Onboard to [facility].  -Patient has no f/u scheduled with PCP. Asked her to schedule one ASAP  Subjective: Courtney Anderson is an 66 y.o. year old female who is a primary patient of Vigg, Avanti, MD.  The CCM team was consulted for assistance with disease management and care coordination needs.    Engaged with patient by telephone for follow up visit in response to provider referral for pharmacy case management and/or care coordination services.   Consent to Services:  The patient was given information about Chronic Care Management services, agreed to services, and gave verbal consent prior to initiation of services.  Please see initial visit note for detailed documentation.   Patient Care Team: Charlynne Cousins, MD as PCP - General Lane Hacker, [facility] (Pharmacist) Hospital visits: None in previous 6 months  Objective:  Lab Results  Component Value Date   CREATININE 0.94 12/07/2021   CREATININE 0.93 03/23/2021   CREATININE 1.02 (H) 01/12/2021    Lab Results  Component Value Date   HGBA1C 7.1 (H) 08/03/2020   Last diabetic Eye exam: No results found for: "HMDIABEYEEXA"  Last diabetic Foot exam: No results found for: "HMDIABFOOTEX"      Component Value Date/Time   CHOL 212 (H) 12/07/2021 1355   CHOL 182 06/02/2015 1107   TRIG 98 12/07/2021 1355   TRIG 93 06/02/2015 1107   HDL 47 12/07/2021 1355   CHOLHDL 4.5 (H) 12/07/2021 1355   VLDL 19 06/02/2015 1107   LDLCALC 147 (H) 12/07/2021 1355       Latest Ref Rng & Units 12/07/2021    1:55 PM 03/23/2021    9:53 AM 01/12/2021    9:33 AM  Hepatic Function  Total Protein 6.0 - 8.5 g/dL 7.8  7.6  8.1   Albumin 3.8 - 4.8 g/dL 4.4  4.5  4.1   AST 0 - 40 IU/L _0 ALT 0 - 32 IU/L _1 Alk Phosphatase 44 - 121 IU/L 132  113  96   Total Bilirubin 0.0 - 1.2 mg/dL 0.4  0.2  0.3     Lab Results   Component Value Date/Time   TSH 0.329 (L) 12/07/2021 01:55 PM   TSH 11.600 (H) 03/23/2021 09:53 AM   FREET4 1.55 12/07/2021 01:55 PM   FREET4 0.66 (L) 12/01/2020 11:40 AM       Latest Ref Rng & Units 12/07/2021    1:55 PM 01/12/2021    9:33 AM 12/01/2020   11:40 AM  CBC  WBC 3.4 - 10.8 x10E3/uL 8.6  6.1  7.7   Hemoglobin 11.1 - 15.9 g/dL 15.2  11.0  8.6   Hematocrit 34.0 - 46.6 % 46.4  36.8  28.7   Platelets 150 - 450 x10E3/uL 236  358  436     Lab Results  Component Value Date/Time   VD25OH 33.9 12/01/2020 11:40 AM   VD25OH 26.4 (L) 10/22/2019 11:00 AM    Clinical ASCVD: No  The 10-year ASCVD risk score (Arnett DK, et al., 2019) is: 11.2%   Values used to calculate the score:     Age: 9 years     Sex: Female     Is Non-Hispanic African American: Yes     Diabetic: No     Tobacco smoker: No     Systolic Blood Pressure:  132 mmHg     Is BP treated: Yes     HDL Cholesterol: 47 mg/dL     Total Cholesterol: 212 mg/dL    Other: (CHADS2VASc if Afib, PHQ9 if depression, MMRC or CAT for COPD, ACT, DEXA)  Social History   Tobacco Use  Smoking Status Former  Smokeless Tobacco Never   BP Readings from Last 3 Encounters:  12/07/21 (!) 160/82  07/20/21 (!) 149/66  03/23/21 131/70   Pulse Readings from Last 3 Encounters:  12/07/21 76  07/20/21 (!) 50  03/23/21 (!) 54   Wt Readings from Last 3 Encounters:  12/07/21 230 lb 9.6 oz (104.6 kg)  08/06/21 200 lb (90.7 kg)  07/20/21 228 lb 12.8 oz (103.8 kg)    Assessment: Review of patient past medical history, allergies, medications, health status, including review of consultants reports, laboratory and other test data, was performed as part of comprehensive evaluation and provision of chronic care management services.   SDOH:  (Social Determinants of Health) assessments and interventions performed:  SDOH Interventions    Flowsheet Row Most Recent Value  SDOH Interventions   Physical Activity Interventions Intervention  Not Indicated  Transportation Interventions Intervention Not Indicated, Other (Comment)  [Patient says son has started taking her to visits]       CCM Care Plan  Allergies  Allergen Reactions   Codeine     Medications Reviewed Today     Reviewed by Lane Hacker, [facility] (Pharmacist) on 05/20/22 at [place] List Status: <None>   Medication Order Taking? Sig Documenting Provider Last Dose Status Informant  amLODipine (NORVASC) 10 MG tablet [id number]  Take 1 tablet by mouth once daily Vigg, Avanti, MD  Active   Cholecalciferol (VITAMIN D) 50 MCG (2000 UT) tablet [id number] No Take 2,000 Units by mouth daily. [provider] Taking Active Self  Iron-Vitamin C 65-125 MG TABS [id number] No Take 1 tablet by mouth in the morning and at bedtime. Will recheck iron level next visit. Marnee Guarneri T, NP Taking Active   levothyroxine (SYNTHROID) 75 MCG tablet [id number]  TAKE 1 TABLET BY MOUTH ONCE DAILY BEFORE BREAKFAST Vigg, Avanti, MD  Active   lisinopril (ZESTRIL) 40 MG tablet [id number]  Take 1 tablet (40 mg total) by mouth daily. Charlynne Cousins, MD  Active   Multiple Vitamins-Minerals (ONE-A-DAY WOMENS 50+ ADVANTAGE) TABS [id number] No Take 1 tablet by mouth daily. [provider] Taking Active             Patient Active Problem List   Diagnosis Date Noted   Need for influenza vaccination 07/20/2021   Bradycardia 01/19/2021   Primary hyperparathyroidism ([place]) 12/02/2020   Hypothyroidism 12/02/2020   Anemia 12/01/2020   History of 2019 novel coronavirus disease (COVID-19) 11/24/2020   Atrial fibrillation with RVR ([place]) 07/24/2020   Vitamin D deficiency 10/17/2019   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   History of hyperthyroidism 08/20/2018   Multinodular goiter 08/20/2018   Hypertension 05/31/2015   Morbid obesity ([place]) 05/31/2015   Elevated transaminase level 05/31/2015   Hyperlipidemia 05/31/2015    Immunization History  Administered Date(s)  Administered   Fluad Quad(high Dose 65+) 07/20/2021   Influenza,inj,Quad PF,6+ Mos 12/19/2016, 08/29/2017, 10/22/2019   PFIZER(Purple Top)SARS-COV-2 Vaccination 12/15/2020, 01/05/2021   Pneumococcal Conjugate-13 03/23/2021   Td 08/24/2007    Conditions to be addressed/monitored: Atrial Fibrillation, HTN, HLD, and Hypothyroidism  Care Plan : [place]  Updates made by Lane Hacker, [place] since 05/20/2022 12:00 AM  Problem: Afib, h/o GIB on anticoagulation, HTN, HLD,hypothyroidism   Priority: High     Long-Range Goal: disease Management   Start Date: 11/12/2021  Recent Progress: On track  Priority: High  Note:   Current Barriers:  Unable to maintain control of HTN  Pharmacist Clinical Goal(s):  Patient will verbalize ability to afford treatment regimen through collaboration with PharmD and provider.   Interventions: 1:1 collaboration with Charlynne Cousins, MD regarding development and update of comprehensive plan of care as evidenced by provider attestation and co-signature Inter-disciplinary care team collaboration (see longitudinal plan of care) Comprehensive medication review performed; medication list updated in electronic medical record  Hypertension (BP goal <140/90) -Not ideally controlled -Current treatment: Amlodipine 10 mg Appropriate, Effective, Safe, Accessible Lisinopril 40 mg Appropriate, Effective, Safe, Accessible -Medications previously tried: metoprolol succinate stopped 09/2021 -Current home readings: <140/90 -Current exercise habits: reports to be going to the gym daily, using weights, walking -Denies hypotensive/hypertensive symptoms -Educated on BP goals and benefits of medications for prevention of heart attack, stroke and kidney damage; Importance of home blood pressure monitoring; -Counseled to monitor BP at home 1-2x/wk, document, and provide log at future appointments -Counseled on diet and exercise extensively Recommended to  continue current medication Patient planning to make dietary changes: will eat less grits and cereal, reduce consumption of bread.   Hypothyroidism (Goal: minimize symptoms) Lab Results  Component Value Date   TSH 0.329 (L) 12/07/2021   -Not ideally controlled -Saw endo 12/1 (Dr Honor Junes) - pt reports good news from biopsy  -Current treatment  Synthroid 75 mcg Appropriate, Query effective,  -Medications previously tried: n/a  -Counseled on diet and exercise extensively Recommended to continue current medication July 2023: Patient needs f/u, will let team know  Atrial Fibrillation (Goal: prevent stroke and major bleeding) -Controlled -Cardio visit planned for EOM 11/2021 per pt   -Previous GI bleed on Xarelto  -CHADSVASC: 3 -Current treatment: Rate control: no tx, previously on metoprolol succinate Anticoagulation: no tx, previously on Xarelto (GI bleed)  -Home BP and HR readings: at goal   -Counseled on Afib action plan reviewed July 2023: Patient needs f/u, counseled to get scheduled ASAP  Patient Goals/Self-Care Activities Patient will:  - take medications as prescribed as evidenced by patient report and record review  Follow Up Plan: PharmD telephone f/u 6 months. BP HC call 3 months  Medication Assistance: None required.  Patient affirms current coverage meets needs.  Patient's preferred pharmacy is:  [facility] [address]), [place] [address] [place]  [address] Phone: [phone] Fax: [phone]  Pt endorses 100% compliance  Follow Up:  Patient agrees to Care Plan and Follow-up.      Future Appointments  Date Time Provider [place]  08/09/2022  8:15 AM CFP NURSE HEALTH ADVISOR [facility]   Verbal consent obtained for [facility] enhanced pharmacy services (medication synchronization, adherence packaging, delivery coordination). A medication sync plan was created to allow  patient to get all medications delivered once every 30 to 90 days per patient preference. Patient understands they have freedom to choose pharmacy and clinical pharmacist will coordinate care between all prescribers and [facility].   [name], PharmD, [place] Clinical Pharmacist  [facility] [phone]

## 2022-05-20 NOTE — Patient Instructions (Signed)
Visit Information   Goals Addressed   None    Patient Care Plan: CCM Pharmacy Care Plan     Problem Identified: Afib, h/o GIB on anticoagulation, HTN, HLD,hypothyroidism   Priority: High     Long-Range Goal: disease Management   Start Date: 11/12/2021  Recent Progress: On track  Priority: High  Note:   Current Barriers:  Unable to maintain control of HTN  Pharmacist Clinical Goal(s):  Patient will verbalize ability to afford treatment regimen through collaboration with PharmD and provider.   Interventions: 1:1 collaboration with Charlynne Cousins, MD regarding development and update of comprehensive plan of care as evidenced by provider attestation and co-signature Inter-disciplinary care team collaboration (see longitudinal plan of care) Comprehensive medication review performed; medication list updated in electronic medical record  Hypertension (BP goal <140/90) -Not ideally controlled -Current treatment: Amlodipine 10 mg Appropriate, Effective, Safe, Accessible Lisinopril 40 mg Appropriate, Effective, Safe, Accessible -Medications previously tried: metoprolol succinate stopped 09/2021 -Current home readings: <140/90 -Current exercise habits: reports to be going to the gym daily, using weights, walking -Denies hypotensive/hypertensive symptoms -Educated on BP goals and benefits of medications for prevention of heart attack, stroke and kidney damage; Importance of home blood pressure monitoring; -Counseled to monitor BP at home 1-2x/wk, document, and provide log at future appointments -Counseled on diet and exercise extensively Recommended to continue current medication Patient planning to make dietary changes: will eat less grits and cereal, reduce consumption of bread.   Hypothyroidism (Goal: minimize symptoms) Lab Results  Component Value Date   TSH 0.329 (L) 12/07/2021   -Not ideally controlled -Saw endo 12/1 (Dr Honor Junes) - pt reports good news from biopsy   -Current treatment  Synthroid 75 mcg Appropriate, Query effective,  -Medications previously tried: n/a  -Counseled on diet and exercise extensively Recommended to continue current medication July 2023: Patient needs f/u, will let team know  Atrial Fibrillation (Goal: prevent stroke and major bleeding) -Controlled -Cardio visit planned for EOM 11/2021 per pt   -Previous GI bleed on Xarelto  -CHADSVASC: 3 -Current treatment: Rate control: no tx, previously on metoprolol succinate Anticoagulation: no tx, previously on Xarelto (GI bleed)  -Home BP and HR readings: at goal   -Counseled on Afib action plan reviewed July 2023: Patient needs f/u, counseled to get scheduled ASAP  Patient Goals/Self-Care Activities Patient will:  - take medications as prescribed as evidenced by patient report and record review  Follow Up Plan: PharmD telephone f/u 6 months. BP HC call 3 months  Medication Assistance: None required.  Patient affirms current coverage meets needs.  Patient's preferred pharmacy is:  [facility] [address]), [place] [address] [place] [address] Phone: [phone] Fax: [phone]  Pt endorses 100% compliance  Follow Up:  Patient agrees to Care Plan and Follow-up.    Patient Care Plan: General Social Work (Adult)     Problem Identified: Barriers to Treatment Resolved 05/03/2021  Priority: Medium  Onset Date: 03/28/2021  Note:   Current barriers:    Financial constraints related to rental assistance, utility assistance, and healthcare premiums Clinical Goals: Patient will work with CCM LCSW to address needs related to financial strain Clinical Interventions:  Assessment of needs and barriers to care were conducted CCM LCSW contacted [place] [phone] with the [facility] to inquire about available appointments for [facility]. Patient was informed that she can schedule next  available appt for June 9, 22 at 1 PM, 2 PM,  or 3 PM Contact information was provided and patient agreed to schedule to discuss programs that may offer lower insurance premiums CCM LCSW discussed various local resources that can assist with food insecurity, rental, and utility assistance. Patient is not interested in [employer] or [employer]. She agreed to allow CCM LCSW to complete care guide referral for rental and utility assistance Referral to care guide for community support and other options   Discussed plans with patient for ongoing care management follow up and provided patient with direct contact information for care management team Other interventions provided:Solution-Focused Strategies, Active listening / Reflection utilized , Emotional Supportive Provided, and Verbalization of feelings encouraged  Collaboration with Vigg, Avanti, MD regarding development and update of comprehensive plan of care as evidenced by provider attestation and co-signature Inter-disciplinary care team collaboration (see longitudinal plan of care) Patient Goals/Self-Care Activities: Over the next 45 days Schedule appointment with [facility] Counselors Attend all scheduled appointments with Providers I have place a referral to the care guide for community resources, they will follow up with you        Ms. Bolt was given information about Chronic Care Management services today including:  CCM service includes personalized support from designated clinical staff supervised by her physician, including individualized plan of care and coordination with other care providers 24/7 contact phone numbers for assistance for urgent and routine care needs. Standard insurance, coinsurance, copays and deductibles apply for chronic care management only during months in which we provide at least 20 minutes of these services. Most insurances cover these services at 100%, however patients may be responsible for any copay, coinsurance  and/or deductible if applicable. This service may help you avoid the need for more expensive face-to-face services. Only one practitioner may furnish and bill the service in a calendar month. The patient may stop CCM services at any time (effective at the end of the month) by phone call to the office staff.  Patient agreed to services and verbal consent obtained.   The patient verbalized understanding of instructions, educational materials, and care plan provided today and DECLINED offer to receive copy of patient instructions, educational materials, and care plan.  CCM Team will f/u PRN, moved to [place], [place]

## 2022-05-23 ENCOUNTER — Telehealth: Payer: Self-pay | Admitting: Family Medicine

## 2022-05-23 MED ORDER — LISINOPRIL 40 MG PO TABS
40.0000 mg | ORAL_TABLET | Freq: Every day | ORAL | 0 refills | Status: DC
Start: 2022-05-23 — End: 2022-06-07

## 2022-05-23 NOTE — Telephone Encounter (Signed)
Overdue for follow up. Please get her scheduled with Junie Panning or Malachy Mood ASAP. Thanks.

## 2022-05-23 NOTE — Telephone Encounter (Signed)
Patient is scheduled 8/4

## 2022-05-23 NOTE — Telephone Encounter (Signed)
-----   Message from [facility] sent at 05/22/2022  3:43 PM EDT ----- Regarding: Medication refills Do you mind sending a refill of patients Lisinopril 40 mg and Synthroid 75 mcg to [facility] since her medications will need to be delivered out really soon. Thank you!

## 2022-06-03 DIAGNOSIS — E782 Mixed hyperlipidemia: Secondary | ICD-10-CM

## 2022-06-03 DIAGNOSIS — I10 Essential (primary) hypertension: Secondary | ICD-10-CM | POA: Diagnosis not present

## 2022-06-03 DIAGNOSIS — E039 Hypothyroidism, unspecified: Secondary | ICD-10-CM

## 2022-06-07 ENCOUNTER — Encounter: Payer: Self-pay | Admitting: Physician Assistant

## 2022-06-07 ENCOUNTER — Ambulatory Visit (INDEPENDENT_AMBULATORY_CARE_PROVIDER_SITE_OTHER): Payer: Medicare Other | Admitting: Physician Assistant

## 2022-06-07 VITALS — BP 108/76 | HR 118 | Temp 99.1°F | Ht 67.52 in | Wt 226.6 lb

## 2022-06-07 DIAGNOSIS — I10 Essential (primary) hypertension: Secondary | ICD-10-CM | POA: Diagnosis not present

## 2022-06-07 DIAGNOSIS — E785 Hyperlipidemia, unspecified: Secondary | ICD-10-CM | POA: Diagnosis not present

## 2022-06-07 DIAGNOSIS — E559 Vitamin D deficiency, unspecified: Secondary | ICD-10-CM | POA: Diagnosis not present

## 2022-06-07 DIAGNOSIS — I4891 Unspecified atrial fibrillation: Secondary | ICD-10-CM

## 2022-06-07 DIAGNOSIS — E039 Hypothyroidism, unspecified: Secondary | ICD-10-CM

## 2022-06-07 MED ORDER — LEVOTHYROXINE SODIUM 75 MCG PO TABS: 75.0000 ug | ORAL_TABLET | Freq: Every day | ORAL | 6 refills | Status: DC

## 2022-06-07 MED ORDER — AMLODIPINE BESYLATE 10 MG PO TABS: 10.0000 mg | ORAL_TABLET | Freq: Every day | ORAL | 6 refills | Status: DC

## 2022-06-07 MED ORDER — LISINOPRIL 40 MG PO TABS: 40.0000 mg | ORAL_TABLET | Freq: Every day | ORAL | 6 refills | Status: DC

## 2022-06-07 NOTE — Assessment & Plan Note (Signed)
Chronic, historic condition  Currently taking Amlodipine 10 mg PO QD and Lisinopril 40 mg PO QD States she is taking daily and denies side effects  Will recheck labs today  Continue current medications and collaboration with Cardiology  Follow up in 6 months unless concerns arise

## 2022-06-07 NOTE — Assessment & Plan Note (Signed)
Chronic, ongoing  Currently seems to be in a fib today- unsure of duration at this time  She denies chest pain, palpitations, syncope, fatigue  She is not currently taking anticoagulants due to GI bleed and anemia following use Discussed disease process with patient and reviewed signs to be mindful of  Recommend regular Cardiology follow up  Follow up for concerning symptoms

## 2022-06-07 NOTE — Progress Notes (Signed)
Established Patient Office Visit  Name: Courtney Anderson   MRN: [id number]    DOB: 03-15-1956   Date:06/10/2022  Today's Provider: Talitha Givens, MHS, PA-C Introduced myself to the patient as a PA-C and provided education on APPs in clinical practice.         Subjective  Chief Complaint  Chief Complaint  Patient presents with   Chronic disease    Here for Chronic Disease F/U    HPI  Hypertension: - Medications: Amlodipine 10 mg and Lisinopril 40 mg - Compliance: Excellent  - Checking BP at home: Yes, once per week and reports BP usually 120/70 on avg - Denies any SOB, CP, vision changes, LE edema, medication SEs, or symptoms of hypotension - Diet: States she tries to reduce her salt intake and has cut out pork. Mostly eats chicken and fish for protein - Exercise: She is exercising. She has joined the [employer]. She states she is riding her bike, lifting weights. Rides bike for 15-20 minutes per session, tries to do so everyday. Lifts weights for about 15-20 minutes twice per day everyday.      HYPOTHYROIDISM TSH at Endo = 1.016 in 04/05/22 Thyroid control status:stable Satisfied with current treatment? Reports concerns for thyroid condition contributing to weight  Medication side effects: no Medication compliance: excellent compliance Etiology of hypothyroidism: postablative  Recent dose adjustment:Was adjusted in Feb to 75 mcg  Fatigue: no Cold intolerance: no Heat intolerance: yes Weight gain: no Weight loss: no Constipation: no Diarrhea/loose stools: no Palpitations: no Lower extremity edema: no Anxiety/depressed mood: no   Morbid Obesity Diet  Exercise : tries to exercise and stay active everyday   HYPERLIPIDEMIA Hyperlipidemia status:  Unmanaged with medications Satisfied with current treatment?   Not currently taking medications Side effects:  no Medication compliance:  Not taking medications  Past cholesterol meds: none Supplements: none Aspirin:   no The 10-year ASCVD risk score (Arnett DK, et al., 2019) is: 6.5%   Values used to calculate the score:     Age: 66 years     Sex: Female     Is Non-Hispanic African American: Yes     Diabetic: No     Tobacco smoker: No     Systolic Blood Pressure: 224 mmHg     Is BP treated: Yes     HDL Cholesterol: 44 mg/dL     Total Cholesterol: 189 mg/dL Chest pain:  no Coronary artery disease:  no Family history CAD:  no Family history early CAD:  no   Atrial Fibrillation  Reports she has not had any concerns or signs that have caused worry She has an apt with Cardiology in Dec    Patient Active Problem List   Diagnosis Date Noted   Need for influenza vaccination 07/20/2021   Bradycardia 01/19/2021   Primary hyperparathyroidism ([place]) 12/02/2020   Hypothyroidism 12/02/2020   Anemia 12/01/2020   History of 2019 novel coronavirus disease (COVID-19) 11/24/2020   Atrial fibrillation with RVR ([place]) 07/24/2020   Vitamin D deficiency 10/17/2019   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   History of hyperthyroidism 08/20/2018   Multinodular goiter 08/20/2018   Hypertension 05/31/2015   Morbid obesity ([place]) 05/31/2015   Elevated transaminase level 05/31/2015   Hyperlipidemia 05/31/2015    Past Surgical History:  Procedure Laterality Date   BREAST SURGERY     COLONOSCOPY WITH PROPOFOL N/A 10/29/2020   Procedure: COLONOSCOPY WITH PROPOFOL;  Surgeon: Lin Landsman, MD;  Location: [facility];  Service: Gastroenterology;  Laterality: N/A;   ECTOPIC PREGNANCY SURGERY     ESOPHAGOGASTRODUODENOSCOPY (EGD) WITH PROPOFOL N/A 10/29/2020   Procedure: ESOPHAGOGASTRODUODENOSCOPY (EGD) WITH PROPOFOL;  Surgeon: Lin Landsman, MD;  Location: [place];  Service: Gastroenterology;  Laterality: N/A;    Family History  Problem Relation Age of Onset   Hypertension Mother    Arthritis Mother    Hypertension Father    Stroke Father    Aneurysm Sister    Hypertension Sister     Hypertension Sister    Hypertension Brother    Hypertension Brother     Social History   Tobacco Use   Smoking status: Former   Smokeless tobacco: Never  Substance Use Topics   Alcohol use: Not Currently     Current Outpatient Medications:    Cholecalciferol (VITAMIN D) 50 MCG (2000 UT) tablet, Take 2,000 Units by mouth daily., Disp: , Rfl:    Multiple Vitamins-Minerals (ONE-A-DAY WOMENS 50+ ADVANTAGE) TABS, Take 1 tablet by mouth daily., Disp: , Rfl:    amLODipine (NORVASC) 10 MG tablet, Take 1 tablet (10 mg total) by mouth daily., Disp: 30 tablet, Rfl: 6   levothyroxine (SYNTHROID) 75 MCG tablet, Take 1 tablet (75 mcg total) by mouth daily before breakfast., Disp: 30 tablet, Rfl: 6   lisinopril (ZESTRIL) 40 MG tablet, Take 1 tablet (40 mg total) by mouth daily., Disp: 30 tablet, Rfl: 6  Allergies  Allergen Reactions   Codeine     I personally reviewed active problem list, medication list, allergies, health maintenance, notes from last encounter, lab results with the patient/caregiver today.   Review of Systems  Constitutional:  Negative for chills and malaise/fatigue.  Eyes:  Negative for blurred vision, double vision, pain and redness.  Respiratory:  Negative for cough, shortness of breath and wheezing.   Cardiovascular:  Negative for chest pain, palpitations and leg swelling.  Gastrointestinal:  Negative for constipation, diarrhea and nausea.  Neurological:  Negative for dizziness and headaches.  Psychiatric/Behavioral:  Negative for depression. The patient is not nervous/anxious.       Objective  Vitals:   06/07/22 1301  BP: 108/76  Pulse: (!) 118  Temp: 99.1 F (37.3 C)  TempSrc: Oral  SpO2: 98%  Weight: 226 lb 9.6 oz (102.8 kg)  Height: 5' 7.52" (1.715 m)    Body mass index is 34.95 kg/m.  Physical Exam Vitals reviewed.  Constitutional:      General: She is awake.     Appearance: Normal appearance. She is well-developed and well-groomed. She is  obese.  HENT:     Head: Normocephalic and atraumatic.  Cardiovascular:     Rate and Rhythm: Tachycardia present. Rhythm irregularly irregular.     Pulses:          Radial pulses are 2+ on the right side and 2+ on the left side.     Heart sounds: No murmur heard.    No friction rub. No gallop.  Pulmonary:     Effort: Pulmonary effort is normal.     Breath sounds: Normal breath sounds. No decreased air movement. No decreased breath sounds, wheezing, rhonchi or rales.  Neurological:     Mental Status: She is alert.  Psychiatric:        Attention and Perception: Attention and perception normal.        Mood and Affect: Mood and affect normal.        Speech: Speech normal.  Behavior: Behavior normal. Behavior is cooperative.      Recent Results (from the past 2160 hour(s))  Comp Met (CMET)     Status: Abnormal   Collection Time: 06/07/22  1:50 PM  Result Value Ref Range   Glucose 82 70 - 99 mg/dL   BUN 11 8 - 27 mg/dL   Creatinine, Ser 0.98 0.57 - 1.00 mg/dL   eGFR 64 >59 mL/min/1.73   BUN/Creatinine Ratio 11 (L) 12 - 28   Sodium 142 134 - 144 mmol/L   Potassium 4.2 3.5 - 5.2 mmol/L   Chloride 102 96 - 106 mmol/L   CO2 23 20 - 29 mmol/L   Calcium 9.8 8.7 - 10.3 mg/dL   Total Protein 7.3 6.0 - 8.5 g/dL   Albumin 4.3 3.9 - 4.9 g/dL   Globulin, Total 3.0 1.5 - 4.5 g/dL   Albumin/Globulin Ratio 1.4 1.2 - 2.2   Bilirubin Total 0.4 0.0 - 1.2 mg/dL   Alkaline Phosphatase 118 44 - 121 IU/L   AST 17 0 - 40 IU/L   ALT 13 0 - 32 IU/L  CBC w/Diff     Status: Abnormal   Collection Time: 06/07/22  1:50 PM  Result Value Ref Range   WBC 9.2 3.4 - 10.8 x10E3/uL   RBC 5.47 (H) 3.77 - 5.28 x10E6/uL   Hemoglobin 14.2 11.1 - 15.9 g/dL   Hematocrit 44.7 34.0 - 46.6 %   MCV 82 79 - 97 fL   MCH 26.0 (L) 26.6 - 33.0 pg   MCHC 31.8 31.5 - 35.7 g/dL   RDW 13.0 11.7 - 15.4 %   Platelets 281 150 - 450 x10E3/uL   Neutrophils 53 Not Estab. %   Lymphs 37 Not Estab. %   Monocytes 7 Not  Estab. %   Eos 2 Not Estab. %   Basos 1 Not Estab. %   Neutrophils Absolute 4.9 1.4 - 7.0 x10E3/uL   Lymphocytes Absolute 3.4 (H) 0.7 - 3.1 x10E3/uL   Monocytes Absolute 0.6 0.1 - 0.9 x10E3/uL   EOS (ABSOLUTE) 0.2 0.0 - 0.4 x10E3/uL   Basophils Absolute 0.1 0.0 - 0.2 x10E3/uL   Immature Granulocytes 0 Not Estab. %   Immature Grans (Abs) 0.0 0.0 - 0.1 x10E3/uL  Lipid Profile     Status: Abnormal   Collection Time: 06/07/22  1:50 PM  Result Value Ref Range   Cholesterol, Total 189 100 - 199 mg/dL   Triglycerides 72 0 - 149 mg/dL   HDL 44 >39 mg/dL   VLDL Cholesterol Cal 13 5 - 40 mg/dL   LDL Chol Calc (NIH) 132 (H) 0 - 99 mg/dL   Chol/HDL Ratio 4.3 0.0 - 4.4 ratio    Comment:                                   T. Chol/HDL Ratio                                             Men  Women                               1/2 Avg.Risk  3.4    3.3  Avg.Risk  5.0    4.4                                2X Avg.Risk  9.6    7.1                                3X Avg.Risk 23.4   11.0   Vitamin D (25 hydroxy)     Status: None   Collection Time: 06/07/22  1:50 PM  Result Value Ref Range   Vit D, 25-Hydroxy 33.8 30.0 - 100.0 ng/mL    Comment: Vitamin D deficiency has been defined by the [place] practice guideline as a level of serum 25-OH vitamin D less than 20 ng/mL (1,2). The Endocrine Society went on to further define vitamin D insufficiency as a level between 21 and 29 ng/mL (2). 1. IOM (Institute of Medicine). 2010. Dietary reference    intakes for calcium and D. [place]: The    [employer]. 2. Holick MF, Binkley Hatton, Bischoff-Ferrari HA, et al.    Evaluation, treatment, and prevention of vitamin D    deficiency: an Endocrine Society clinical practice    guideline. JCEM. 2011 Jul; 96(7):1911-30.      PHQ2/9:    08/06/2021    8:21 AM 03/23/2021    9:14 AM 01/12/2021    8:51 AM 12/01/2020   10:17 AM  10/25/2020    3:32 PM  Depression screen PHQ 2/9  Decreased Interest 0 0 0 0 0  Down, Depressed, Hopeless 0 0 0 0 0  PHQ - 2 Score 0 0 0 0 0  Altered sleeping  0     Tired, decreased energy  0     Change in appetite  0     Feeling bad or failure about yourself   0     Trouble concentrating  0     Moving slowly or fidgety/restless  0     Suicidal thoughts  0     PHQ-9 Score  0     Difficult doing work/chores  Not difficult at all         Fall Risk:    06/07/2022    1:07 PM 08/06/2021    8:20 AM 03/23/2021    9:14 AM 12/01/2020   10:17 AM 10/25/2020    3:31 PM  Fall Risk   Falls in the past year? 0 0 0 0 0  Number falls in past yr: 0  0  0  Injury with Fall? 0  0  0  Risk for fall due to : No Fall Risks Medication side effect;Impaired balance/gait     Follow up Falls evaluation completed Falls evaluation completed;Education provided;Falls prevention discussed Falls evaluation completed        Functional Status Survey:      Assessment & Plan Problem List Items Addressed This Visit       Cardiovascular and Mediastinum   Hypertension - Primary    Chronic, historic condition  Currently taking Amlodipine 10 mg PO QD and Lisinopril 40 mg PO QD States she is taking daily and denies side effects  Will recheck labs today  Continue current medications and collaboration with Cardiology  Follow up in 6 months unless concerns arise       Relevant Medications   lisinopril (ZESTRIL) 40 MG tablet  amLODipine (NORVASC) 10 MG tablet   Other Relevant Orders   Comp Met (CMET) (Completed)   CBC w/Diff (Completed)   Atrial fibrillation with RVR ([facility])    Chronic, ongoing  Currently seems to be in a fib today- unsure of duration at this time  She denies chest pain, palpitations, syncope, fatigue  She is not currently taking anticoagulants due to GI bleed and anemia following use Discussed disease process with patient and reviewed signs to be mindful of  Recommend regular  Cardiology follow up  Follow up for concerning symptoms         Relevant Medications   lisinopril (ZESTRIL) 40 MG tablet   amLODipine (NORVASC) 10 MG tablet     Endocrine   Hypothyroidism    Chronic, ongoing condition  Currently taking Levothyroxine 75 mcg and appears to be tolerating well Continue current medications Will recheck TSH- results to dictate further management Follow up in 6 months unless concerns arise       Relevant Medications   levothyroxine (SYNTHROID) 75 MCG tablet     Other   Morbid obesity ([facility])    Chronic, ongoing condition Reports she is trying to stay active - has joined the [facility] and is trying to ride a bike and lift weights every day She reports she has tried to modify her diet by reducing salt and eating more lean meats for protein Encouraged her to continue these efforts  Follow up in 6 months      Hyperlipidemia    Chronic, historic condition Not currently taking statin  She is exercising and has implemented dietary changes Recommend she continue these Recheck lipid panel - results to dictate further management Follow up in 6 months for monitoring       Relevant Medications   lisinopril (ZESTRIL) 40 MG tablet   amLODipine (NORVASC) 10 MG tablet   Other Relevant Orders   Lipid Profile (Completed)   Vitamin D deficiency   Relevant Orders   Vitamin D (25 hydroxy) (Completed)     No follow-ups on file.   I, Malissie Musgrave E Steffani Dionisio, PA-C, have reviewed all documentation for this visit. The documentation on 06/10/22 for the exam, diagnosis, procedures, and orders are all accurate and complete.   Talitha Givens, MHS, PA-C [facility]

## 2022-06-08 LAB — CBC WITH DIFFERENTIAL/PLATELET
Basophils Absolute: 0.1 10*3/uL (ref 0.0–0.2)
Basos: 1 %
EOS (ABSOLUTE): 0.2 10*3/uL (ref 0.0–0.4)
Eos: 2 %
Hematocrit: 44.7 % (ref 34.0–46.6)
Hemoglobin: 14.2 g/dL (ref 11.1–15.9)
Immature Grans (Abs): 0 10*3/uL (ref 0.0–0.1)
Immature Granulocytes: 0 %
Lymphocytes Absolute: 3.4 10*3/uL — ABNORMAL HIGH (ref 0.7–3.1)
Lymphs: 37 %
MCH: 26 pg — ABNORMAL LOW (ref 26.6–33.0)
MCHC: 31.8 g/dL (ref 31.5–35.7)
MCV: 82 fL (ref 79–97)
Monocytes Absolute: 0.6 10*3/uL (ref 0.1–0.9)
Monocytes: 7 %
Neutrophils Absolute: 4.9 10*3/uL (ref 1.4–7.0)
Neutrophils: 53 %
Platelets: 281 10*3/uL (ref 150–450)
RBC: 5.47 x10E6/uL — ABNORMAL HIGH (ref 3.77–5.28)
RDW: 13 % (ref 11.7–15.4)
WBC: 9.2 10*3/uL (ref 3.4–10.8)

## 2022-06-08 LAB — COMPREHENSIVE METABOLIC PANEL
ALT: 13 IU/L (ref 0–32)
AST: 17 IU/L (ref 0–40)
Albumin/Globulin Ratio: 1.4 (ref 1.2–2.2)
Albumin: 4.3 g/dL (ref 3.9–4.9)
Alkaline Phosphatase: 118 IU/L (ref 44–121)
BUN/Creatinine Ratio: 11 — ABNORMAL LOW (ref 12–28)
BUN: 11 mg/dL (ref 8–27)
Bilirubin Total: 0.4 mg/dL (ref 0.0–1.2)
CO2: 23 mmol/L (ref 20–29)
Calcium: 9.8 mg/dL (ref 8.7–10.3)
Chloride: 102 mmol/L (ref 96–106)
Creatinine, Ser: 0.98 mg/dL (ref 0.57–1.00)
Globulin, Total: 3 g/dL (ref 1.5–4.5)
Glucose: 82 mg/dL (ref 70–99)
Potassium: 4.2 mmol/L (ref 3.5–5.2)
Sodium: 142 mmol/L (ref 134–144)
Total Protein: 7.3 g/dL (ref 6.0–8.5)
eGFR: 64 mL/min/{1.73_m2} (ref >59)

## 2022-06-08 LAB — LIPID PANEL
Chol/HDL Ratio: 4.3 ratio (ref 0.0–4.4)
Cholesterol, Total: 189 mg/dL (ref 100–199)
HDL: 44 mg/dL (ref >39)
LDL Chol Calc (NIH): 132 mg/dL — ABNORMAL HIGH (ref 0–99)
Triglycerides: 72 mg/dL (ref 0–149)
VLDL Cholesterol Cal: 13 mg/dL (ref 5–40)

## 2022-06-08 LAB — VITAMIN D 25 HYDROXY (VIT D DEFICIENCY, FRACTURES): Vit D, 25-Hydroxy: 33.8 ng/mL (ref 30.0–100.0)

## 2022-06-10 NOTE — Assessment & Plan Note (Signed)
Chronic, historic condition Not currently taking statin  She is exercising and has implemented dietary changes Recommend she continue these Recheck lipid panel - results to dictate further management Follow up in 6 months for monitoring

## 2022-06-10 NOTE — Assessment & Plan Note (Signed)
Chronic, ongoing condition  Currently taking Levothyroxine 75 mcg and appears to be tolerating well Continue current medications Will recheck TSH- results to dictate further management Follow up in 6 months unless concerns arise

## 2022-06-10 NOTE — Assessment & Plan Note (Addendum)
Chronic, ongoing condition Reports she is trying to stay active - has joined the [employer] and is trying to ride a bike and lift weights every day She reports she has tried to modify her diet by reducing salt and eating more lean meats for protein Encouraged her to continue these efforts  Follow up in 6 months

## 2022-06-24 ENCOUNTER — Encounter: Payer: Medicare Other | Admitting: Family Medicine

## 2022-06-28 ENCOUNTER — Other Ambulatory Visit: Payer: Self-pay | Admitting: Family Medicine

## 2022-06-28 DIAGNOSIS — I10 Essential (primary) hypertension: Secondary | ICD-10-CM

## 2022-06-28 NOTE — Telephone Encounter (Signed)
Refilled 06/07/2022 #30 6 refills. Requested Prescriptions  Pending Prescriptions Disp Refills  . lisinopril (ZESTRIL) 40 MG tablet [Pharmacy Med Name: lisinopril 40 mg tablet] 30 tablet 0    Sig: TAKE ONE TABLET BY MOUTH ONCE DAILY Needs appointment for further refills     Cardiovascular:  ACE Inhibitors Passed - 06/28/2022 11:42 AM      Passed - Cr in normal range and within 180 days    Creatinine, Ser  Date Value Ref Range Status  06/07/2022 0.98 0.57 - 1.00 mg/dL Final         Passed - K in normal range and within 180 days    Potassium  Date Value Ref Range Status  06/07/2022 4.2 3.5 - 5.2 mmol/L Final         Passed - Patient is not pregnant      Passed - Last BP in normal range    BP Readings from Last 1 Encounters:  06/07/22 108/76         Passed - Valid encounter within last 6 months    Recent Outpatient Visits          3 weeks ago Hypertension, unspecified type   [facility] Mecum, Erin E, PA-C   6 months ago Hypothyroidism, unspecified type   [facility] Vigg, Avanti, MD   11 months ago Need for influenza vaccination   [place] Vigg, Avanti, MD   1 year ago Hypothyroidism, unspecified type   [facility] Vigg, Avanti, MD   1 year ago Hyperlipidemia, unspecified hyperlipidemia type   [facility] Vigg, Avanti, MD      Future Appointments            In 1 month [place], [place]

## 2022-07-01 ENCOUNTER — Telehealth: Payer: Self-pay

## 2022-07-01 NOTE — Progress Notes (Signed)
    Chronic Care Management Pharmacy Assistant   Name: Courtney Anderson  MRN: [id number] DOB: 1956/05/04  Medications: Outpatient Encounter Medications as of 07/01/2022  Medication Sig   amLODipine (NORVASC) 10 MG tablet Take 1 tablet (10 mg total) by mouth daily.   Cholecalciferol (VITAMIN D) 50 MCG (2000 UT) tablet Take 2,000 Units by mouth daily.   levothyroxine (SYNTHROID) 75 MCG tablet Take 1 tablet (75 mcg total) by mouth daily before breakfast.   lisinopril (ZESTRIL) 40 MG tablet Take 1 tablet (40 mg total) by mouth daily.   Multiple Vitamins-Minerals (ONE-A-DAY WOMENS 50+ ADVANTAGE) TABS Take 1 tablet by mouth daily.   No facility-administered encounter medications on file as of 07/01/2022.   Reviewed chart for medication changes ahead of medication coordination call.  No OVs, Consults, or hospital visits since last care coordination call/Pharmacist visit. (If appropriate, list visit date, provider name)  No medication changes indicated OR if recent visit, treatment plan here.  BP Readings from Last 3 Encounters:  06/07/22 108/76  12/07/21 (!) 160/82  07/20/21 (!) 149/66    Lab Results  Component Value Date   HGBA1C 7.1 (H) 08/03/2020     Patient obtains medications through Vials  90 Days   Last adherence delivery included:  Amlodipine 10 mg 1 tab Breakfast Synthroid 75 mcg 1 tab Breakfast Lisinopril 40 mg 1 tab Breakfast   Patient is due for next adherence delivery on: 07/11/22. Called patient and reviewed medications and coordinated delivery.  This delivery to include: Amlodipine 10 mg 1 tab Breakfast Synthroid 75 mcg 1 tab Breakfast Lisinopril 40 mg 1 tab Breakfast  Confirmed delivery date of 07/11/22, advised patient that pharmacy will contact them the morning of delivery.   Care Gaps: Colonoscopy-10/29/20 Diabetic Foot Exam-NA Mammogram-10/27/10 Ophthalmology-NA Dexa Scan - 07/24/20 Annual Well Visit - NA Micro albumin-NA Hemoglobin A1c- NA  Star Rating  Drugs: Lisinopril 40 mg-last fill 06/07/22 90 ds   Ethelene Hal Clinical Pharmacist Assistant [phone]

## 2022-07-08 ENCOUNTER — Other Ambulatory Visit: Payer: Self-pay | Admitting: Family Medicine

## 2022-07-08 DIAGNOSIS — I10 Essential (primary) hypertension: Secondary | ICD-10-CM

## 2022-07-10 ENCOUNTER — Other Ambulatory Visit: Payer: Self-pay | Admitting: Physician Assistant

## 2022-07-10 DIAGNOSIS — I10 Essential (primary) hypertension: Secondary | ICD-10-CM

## 2022-07-10 DIAGNOSIS — E039 Hypothyroidism, unspecified: Secondary | ICD-10-CM

## 2022-07-10 MED ORDER — AMLODIPINE BESYLATE 10 MG PO TABS: 10.0000 mg | ORAL_TABLET | Freq: Every day | ORAL | 6 refills | Status: DC

## 2022-07-10 MED ORDER — LEVOTHYROXINE SODIUM 75 MCG PO TABS: 75.0000 ug | ORAL_TABLET | Freq: Every day | ORAL | 6 refills | Status: DC

## 2022-07-10 NOTE — Telephone Encounter (Signed)
[place], [place] from [facility] calling to check on status of Linisopril that was requested on 07/08/22. I advised her that it was sent to [facility] on 06/07/22 #30/6. She states they received one back on 05/23/22 for 30 DS but pt just transitioned to them early July. I advised her that I would send Lisinopril over so they can send out pt's medications. It had OV on 06/07/22 as well. Confirmed that they did receive the 2 rxs that were sent today. She confirmed. Updated pharmacy prefernce in pt chart too.

## 2022-07-10 NOTE — Progress Notes (Deleted)
Established Patient Office Visit  Name: Courtney Anderson   MRN: [id number]    DOB: 1956/04/06   Date:07/10/2022  Today's Provider: Talitha Givens, MHS, PA-C Introduced myself to the patient as a PA-C and provided education on APPs in clinical practice.         Subjective  Chief Complaint  No chief complaint on file.   HPI   Patient Active Problem List   Diagnosis Date Noted   Need for influenza vaccination 07/20/2021   Bradycardia 01/19/2021   Primary hyperparathyroidism ([place]) 12/02/2020   Hypothyroidism 12/02/2020   Anemia 12/01/2020   History of 2019 novel coronavirus disease (COVID-19) 11/24/2020   Atrial fibrillation with RVR ([place]) 07/24/2020   Vitamin D deficiency 10/17/2019   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   History of hyperthyroidism 08/20/2018   Multinodular goiter 08/20/2018   Hypertension 05/31/2015   Morbid obesity ([place]) 05/31/2015   Elevated transaminase level 05/31/2015   Hyperlipidemia 05/31/2015    Past Surgical History:  Procedure Laterality Date   BREAST SURGERY     COLONOSCOPY WITH PROPOFOL N/A 10/29/2020   Procedure: COLONOSCOPY WITH PROPOFOL;  Surgeon: Lin Landsman, MD;  Location: [facility] ENDOSCOPY;  Service: Gastroenterology;  Laterality: N/A;   ECTOPIC PREGNANCY SURGERY     ESOPHAGOGASTRODUODENOSCOPY (EGD) WITH PROPOFOL N/A 10/29/2020   Procedure: ESOPHAGOGASTRODUODENOSCOPY (EGD) WITH PROPOFOL;  Surgeon: Lin Landsman, MD;  Location: [facility];  Service: Gastroenterology;  Laterality: N/A;    Family History  Problem Relation Age of Onset   Hypertension Mother    Arthritis Mother    Hypertension Father    Stroke Father    Aneurysm Sister    Hypertension Sister    Hypertension Sister    Hypertension Brother    Hypertension Brother     Social History   Tobacco Use   Smoking status: Former   Smokeless tobacco: Never  Substance Use Topics   Alcohol use: Not Currently     Current Outpatient  Medications:    amLODipine (NORVASC) 10 MG tablet, Take 1 tablet (10 mg total) by mouth daily., Disp: 30 tablet, Rfl: 6   Cholecalciferol (VITAMIN D) 50 MCG (2000 UT) tablet, Take 2,000 Units by mouth daily., Disp: , Rfl:    levothyroxine (SYNTHROID) 75 MCG tablet, Take 1 tablet (75 mcg total) by mouth daily before breakfast., Disp: 30 tablet, Rfl: 6   lisinopril (ZESTRIL) 40 MG tablet, Take 1 tablet (40 mg total) by mouth daily., Disp: 30 tablet, Rfl: 6   Multiple Vitamins-Minerals (ONE-A-DAY WOMENS 50+ ADVANTAGE) TABS, Take 1 tablet by mouth daily., Disp: , Rfl:   Allergies  Allergen Reactions   Codeine     I personally reviewed {Reviewed:14835} with the patient/caregiver today.   ROS    Objective  There were no vitals filed for this visit.  There is no height or weight on file to calculate BMI.  Physical Exam   Recent Results (from the past 2160 hour(s))  Comp Met (CMET)     Status: Abnormal   Collection Time: 06/07/22  1:50 PM  Result Value Ref Range   Glucose 82 70 - 99 mg/dL   BUN 11 8 - 27 mg/dL   Creatinine, Ser 0.98 0.57 - 1.00 mg/dL   eGFR 64 >59 mL/min/1.73   BUN/Creatinine Ratio 11 (L) 12 - 28   Sodium 142 134 - 144 mmol/L   Potassium 4.2 3.5 - 5.2 mmol/L   Chloride 102 96 - 106 mmol/L  CO2 23 20 - 29 mmol/L   Calcium 9.8 8.7 - 10.3 mg/dL   Total Protein 7.3 6.0 - 8.5 g/dL   Albumin 4.3 3.9 - 4.9 g/dL   Globulin, Total 3.0 1.5 - 4.5 g/dL   Albumin/Globulin Ratio 1.4 1.2 - 2.2   Bilirubin Total 0.4 0.0 - 1.2 mg/dL   Alkaline Phosphatase 118 44 - 121 IU/L   AST 17 0 - 40 IU/L   ALT 13 0 - 32 IU/L  CBC w/Diff     Status: Abnormal   Collection Time: 06/07/22  1:50 PM  Result Value Ref Range   WBC 9.2 3.4 - 10.8 x10E3/uL   RBC 5.47 (H) 3.77 - 5.28 x10E6/uL   Hemoglobin 14.2 11.1 - 15.9 g/dL   Hematocrit 44.7 34.0 - 46.6 %   MCV 82 79 - 97 fL   MCH 26.0 (L) 26.6 - 33.0 pg   MCHC 31.8 31.5 - 35.7 g/dL   RDW 13.0 11.7 - 15.4 %   Platelets 281 150 -  450 x10E3/uL   Neutrophils 53 Not Estab. %   Lymphs 37 Not Estab. %   Monocytes 7 Not Estab. %   Eos 2 Not Estab. %   Basos 1 Not Estab. %   Neutrophils Absolute 4.9 1.4 - 7.0 x10E3/uL   Lymphocytes Absolute 3.4 (H) 0.7 - 3.1 x10E3/uL   Monocytes Absolute 0.6 0.1 - 0.9 x10E3/uL   EOS (ABSOLUTE) 0.2 0.0 - 0.4 x10E3/uL   Basophils Absolute 0.1 0.0 - 0.2 x10E3/uL   Immature Granulocytes 0 Not Estab. %   Immature Grans (Abs) 0.0 0.0 - 0.1 x10E3/uL  Lipid Profile     Status: Abnormal   Collection Time: 06/07/22  1:50 PM  Result Value Ref Range   Cholesterol, Total 189 100 - 199 mg/dL   Triglycerides 72 0 - 149 mg/dL   HDL 44 >39 mg/dL   VLDL Cholesterol Cal 13 5 - 40 mg/dL   LDL Chol Calc (NIH) 132 (H) 0 - 99 mg/dL   Chol/HDL Ratio 4.3 0.0 - 4.4 ratio    Comment:                                   T. Chol/HDL Ratio                                             Men  Women                               1/2 Avg.Risk  3.4    3.3                                   Avg.Risk  5.0    4.4                                2X Avg.Risk  9.6    7.1                                3X Avg.Risk 23.4   11.0   Vitamin  D (25 hydroxy)     Status: None   Collection Time: 06/07/22  1:50 PM  Result Value Ref Range   Vit D, 25-Hydroxy 33.8 30.0 - 100.0 ng/mL    Comment: Vitamin D deficiency has been defined by the [place] practice guideline as a level of serum 25-OH vitamin D less than 20 ng/mL (1,2). The Endocrine Society went on to further define vitamin D insufficiency as a level between 21 and 29 ng/mL (2). 1. IOM (Institute of Medicine). 2010. Dietary reference    intakes for calcium and D. [place]: The    [employer]. 2. Holick MF, Binkley [place], Bischoff-Ferrari HA, et al.    Evaluation, treatment, and prevention of vitamin D    deficiency: an Endocrine Society clinical practice    guideline. JCEM. 2011 Jul; 96(7):1911-30.      PHQ2/9:     08/06/2021    8:21 AM 03/23/2021    9:14 AM 01/12/2021    8:51 AM 12/01/2020   10:17 AM 10/25/2020    3:32 PM  Depression screen PHQ 2/9  Decreased Interest 0 0 0 0 0  Down, Depressed, Hopeless 0 0 0 0 0  PHQ - 2 Score 0 0 0 0 0  Altered sleeping  0     Tired, decreased energy  0     Change in appetite  0     Feeling bad or failure about yourself   0     Trouble concentrating  0     Moving slowly or fidgety/restless  0     Suicidal thoughts  0     PHQ-9 Score  0     Difficult doing work/chores  Not difficult at all         Fall Risk:    06/07/2022    1:07 PM 08/06/2021    8:20 AM 03/23/2021    9:14 AM 12/01/2020   10:17 AM 10/25/2020    3:31 PM  Fall Risk   Falls in the past year? 0 0 0 0 0  Number falls in past yr: 0  0  0  Injury with Fall? 0  0  0  Risk for fall due to : No Fall Risks Medication side effect;Impaired balance/gait     Follow up Falls evaluation completed Falls evaluation completed;Education provided;Falls prevention discussed Falls evaluation completed        Functional Status Survey:      Assessment & Plan

## 2022-08-08 ENCOUNTER — Ambulatory Visit (INDEPENDENT_AMBULATORY_CARE_PROVIDER_SITE_OTHER): Payer: Medicare Other | Admitting: *Deleted

## 2022-08-08 DIAGNOSIS — Z0000 Encounter for general adult medical examination without abnormal findings: Secondary | ICD-10-CM | POA: Diagnosis not present

## 2022-08-08 NOTE — Patient Instructions (Signed)
Courtney Anderson , Thank you for taking time to come for your Medicare Wellness Visit. I appreciate your ongoing commitment to your health goals. Please review the following plan we discussed and let me know if I can assist you in the future.   Screening recommendations/referrals: Colonoscopy: up to date Mammogram: Education provided Bone Density: Education provided Recommended yearly ophthalmology/optometry visit for glaucoma screening and checkup Recommended yearly dental visit for hygiene and checkup  Vaccinations: Influenza vaccine: Education provided Pneumococcal vaccine: Education provided Tdap vaccine: Education provided Shingles vaccine: Education provided    Advanced directives: Education provided  Conditions/risks identified:      Preventive Care 80 Years and Older, Female Preventive care refers to lifestyle choices and visits with your health care provider that can promote health and wellness. What does preventive care include? A yearly physical exam. This is also called an annual well check. Dental exams once or twice a year. Routine eye exams. Ask your health care provider how often you should have your eyes checked. Personal lifestyle choices, including: Daily care of your teeth and gums. Regular physical activity. Eating a healthy diet. Avoiding tobacco and drug use. Limiting alcohol use. Practicing safe sex. Taking low-dose aspirin every day. Taking vitamin and mineral supplements as recommended by your health care provider. What happens during an annual well check? The services and screenings done by your health care provider during your annual well check will depend on your age, overall health, lifestyle risk factors, and family history of disease. Counseling  Your health care provider may ask you questions about your: Alcohol use. Tobacco use. Drug use. Emotional well-being. Home and relationship well-being. Sexual activity. Eating habits. History of  falls. Memory and ability to understand (cognition). Work and work Statistician. Reproductive health. Screening  You may have the following tests or measurements: Height, weight, and BMI. Blood pressure. Lipid and cholesterol levels. These may be checked every 5 years, or more frequently if you are over 39 years old. Skin check. Lung cancer screening. You may have this screening every year starting at age 47 if you have a 30-pack-year history of smoking and currently smoke or have quit within the past 15 years. Fecal occult blood test (FOBT) of the stool. You may have this test every year starting at age 50. Flexible sigmoidoscopy or colonoscopy. You may have a sigmoidoscopy every 5 years or a colonoscopy every 10 years starting at age 32. Hepatitis C blood test. Hepatitis B blood test. Sexually transmitted disease (STD) testing. Diabetes screening. This is done by checking your blood sugar (glucose) after you have not eaten for a while (fasting). You may have this done every 1-3 years. Bone density scan. This is done to screen for osteoporosis. You may have this done starting at age 66. Mammogram. This may be done every 1-2 years. Talk to your health care provider about how often you should have regular mammograms. Talk with your health care provider about your test results, treatment options, and if necessary, the need for more tests. Vaccines  Your health care provider may recommend certain vaccines, such as: Influenza vaccine. This is recommended every year. Tetanus, diphtheria, and acellular pertussis (Tdap, Td) vaccine. You may need a Td booster every 10 years. Zoster vaccine. You may need this after age 68. Pneumococcal 13-valent conjugate (PCV13) vaccine. One dose is recommended after age 43. Pneumococcal polysaccharide (PPSV23) vaccine. One dose is recommended after age 76. Talk to your health care provider about which screenings and vaccines you need and how often  you need  them. This information is not intended to replace advice given to you by your health care provider. Make sure you discuss any questions you have with your health care provider. Document Released: 11/17/2015 Document Revised: 07/10/2016 Document Reviewed: 08/22/2015 Elsevier Interactive Patient Education  2017 [place] Prevention in the Home Falls can cause injuries. They can happen to people of all ages. There are many things you can do to make your home safe and to help prevent falls. What can I do on the outside of my home? Regularly fix the edges of walkways and driveways and fix any cracks. Remove anything that might make you trip as you walk through a door, such as a raised step or threshold. Trim any bushes or trees on the path to your home. Use bright outdoor lighting. Clear any walking paths of anything that might make someone trip, such as rocks or tools. Regularly check to see if handrails are loose or broken. Make sure that both sides of any steps have handrails. Any raised decks and porches should have guardrails on the edges. Have any leaves, snow, or ice cleared regularly. Use sand or salt on walking paths during winter. Clean up any spills in your garage right away. This includes oil or grease spills. What can I do in the bathroom? Use night lights. Install grab bars by the toilet and in the tub and shower. Do not use towel bars as grab bars. Use non-skid mats or decals in the tub or shower. If you need to sit down in the shower, use a plastic, non-slip stool. Keep the floor dry. Clean up any water that spills on the floor as soon as it happens. Remove soap buildup in the tub or shower regularly. Attach bath mats securely with double-sided non-slip rug tape. Do not have throw rugs and other things on the floor that can make you trip. What can I do in the bedroom? Use night lights. Make sure that you have a light by your bed that is easy to reach. Do not use  any sheets or blankets that are too big for your bed. They should not hang down onto the floor. Have a firm chair that has side arms. You can use this for support while you get dressed. Do not have throw rugs and other things on the floor that can make you trip. What can I do in the kitchen? Clean up any spills right away. Avoid walking on wet floors. Keep items that you use a lot in easy-to-reach places. If you need to reach something above you, use a strong step stool that has a grab bar. Keep electrical cords out of the way. Do not use floor polish or wax that makes floors slippery. If you must use wax, use non-skid floor wax. Do not have throw rugs and other things on the floor that can make you trip. What can I do with my stairs? Do not leave any items on the stairs. Make sure that there are handrails on both sides of the stairs and use them. Fix handrails that are broken or loose. Make sure that handrails are as long as the stairways. Check any carpeting to make sure that it is firmly attached to the stairs. Fix any carpet that is loose or worn. Avoid having throw rugs at the top or bottom of the stairs. If you do have throw rugs, attach them to the floor with carpet tape. Make sure that you have a light  switch at the top of the stairs and the bottom of the stairs. If you do not have them, ask someone to add them for you. What else can I do to help prevent falls? Wear shoes that: Do not have high heels. Have rubber bottoms. Are comfortable and fit you well. Are closed at the toe. Do not wear sandals. If you use a stepladder: Make sure that it is fully opened. Do not climb a closed stepladder. Make sure that both sides of the stepladder are locked into place. Ask someone to hold it for you, if possible. Clearly mark and make sure that you can see: Any grab bars or handrails. First and last steps. Where the edge of each step is. Use tools that help you move around (mobility aids)  if they are needed. These include: Canes. Walkers. Scooters. Crutches. Turn on the lights when you go into a dark area. Replace any light bulbs as soon as they burn out. Set up your furniture so you have a clear path. Avoid moving your furniture around. If any of your floors are uneven, fix them. If there are any pets around you, be aware of where they are. Review your medicines with your doctor. Some medicines can make you feel dizzy. This can increase your chance of falling. Ask your doctor what other things that you can do to help prevent falls. This information is not intended to replace advice given to you by your health care provider. Make sure you discuss any questions you have with your health care provider. Document Released: 08/17/2009 Document Revised: 03/28/2016 Document Reviewed: 11/25/2014 Elsevier Interactive Patient Education  2017 [employer].

## 2022-08-08 NOTE — Progress Notes (Signed)
Subjective:   Courtney Anderson is a 66 y.o. female who presents for Medicare Annual (Subsequent) preventive examination.  I connected with  Courtney Anderson on 08/08/22 by a video enabled telemedicine application and verified that I am speaking with the correct person using two identifiers.   I discussed the limitations of evaluation and management by telemedicine. The patient expressed understanding and agreed to proceed.  Patient location: home  Provider location:  Tele-health-home   Review of Systems     Cardiac Risk Factors include: advanced age (>57mn, >>28women);hypertension;obesity (BMI >30kg/m2)     Objective:    Today's Vitals   There is no height or weight on file to calculate BMI.     08/08/2022    8:37 AM 08/06/2021    8:19 AM 10/29/2020    7:21 AM 10/27/2020    4:00 PM 10/27/2020   12:23 PM 07/24/2020    9:04 AM  Advanced Directives  Does Patient Have a Medical Advance Directive? No No No  Yes No  Type of Advance Directive    Healthcare Power of [place] Chart?    No - copy requested    Would patient like information on creating a medical advance directive? No - Patient declined  No - Patient declined   No - Patient declined    Current Medications (verified) Outpatient Encounter Medications as of 08/08/2022  Medication Sig   amLODipine (NORVASC) 10 MG tablet Take 1 tablet (10 mg total) by mouth daily.   Cholecalciferol (VITAMIN D) 50 MCG (2000 UT) tablet Take 2,000 Units by mouth daily.   levothyroxine (SYNTHROID) 75 MCG tablet Take 1 tablet (75 mcg total) by mouth daily before breakfast.   lisinopril (ZESTRIL) 40 MG tablet TAKE ONE TABLET BY MOUTH ONCE DAILY Needs appointment for further refills   Multiple Vitamins-Minerals (ONE-A-DAY WOMENS 50+ ADVANTAGE) TABS Take 1 tablet by mouth daily.   No facility-administered encounter medications on file as of 08/08/2022.    Allergies  (verified) Codeine   History: Past Medical History:  Diagnosis Date   Allergy    COVID-19    Elevated transaminase level    Hyperlipidemia    Hypertension    Obesity    Past Surgical History:  Procedure Laterality Date   BREAST SURGERY     COLONOSCOPY WITH PROPOFOL N/A 10/29/2020   Procedure: COLONOSCOPY WITH PROPOFOL;  Surgeon: VLin Landsman MD;  [facility]  Service: Gastroenterology;  Laterality: N/A;   ECTOPIC PREGNANCY SURGERY     ESOPHAGOGASTRODUODENOSCOPY (EGD) WITH PROPOFOL N/A 10/29/2020   Procedure: ESOPHAGOGASTRODUODENOSCOPY (EGD) WITH PROPOFOL;  Surgeon: VLin Landsman MD;  [facility]  Service: Gastroenterology;  Laterality: N/A;   Family History  Problem Relation Age of Onset   Hypertension Mother    Arthritis Mother    Hypertension Father    Stroke Father    Aneurysm Sister    Hypertension Sister    Hypertension Sister    Hypertension Brother    Hypertension Brother    Social History   Socioeconomic History   Marital status: Single    Spouse name: Not on file   Number of children: Not on file   Years of education: Not on file   Highest education level: Not on file  Occupational History   Not on file  Tobacco Use   Smoking status: Former   Smokeless tobacco: Never  Vaping Use   Vaping Use:  Never used  Substance and Sexual Activity   Alcohol use: Not Currently   Drug use: No   Sexual activity: Not Currently  Other Topics Concern   Not on file  Social History Narrative   Not on file   Social Determinants of Health   Financial Resource Strain: Low Risk  (08/08/2022)   Overall Financial Resource Strain (CARDIA)    Difficulty of Paying Living Expenses: Not very hard  Food Insecurity: No Food Insecurity (08/08/2022)   Hunger Vital Sign    Worried About Running Out of Food in the Last Year: Never true    Ran Out of Food in the Last Year: Never true  Transportation Needs: Unmet Transportation Needs  (08/08/2022)   PRAPARE - Hydrologist (Medical): Yes    Lack of Transportation (Non-Medical): No  Physical Activity: Sufficiently Active (08/08/2022)   Exercise Vital Sign    Days of Exercise per Week: 4 days    Minutes of Exercise per Session: 40 min  Recent Concern: Physical Activity - Insufficiently Active (08/08/2022)   Exercise Vital Sign    Days of Exercise per Week: 3 days    Minutes of Exercise per Session: 40 min  Stress: No Stress Concern Present (08/08/2022)   [place]    Feeling of Stress : Only a little  Social Connections: Unknown (08/08/2022)   Social Connection and Isolation Panel [NHANES]    Frequency of Communication with Friends and Family: Three times a week    Frequency of Social Gatherings with Friends and Family: Once a week    Attends Religious Services: More than 4 times per year    Active Member of [employer] or Organizations: Yes    Attends Archivist Meetings: Never    Marital Status: Not on file    Tobacco Counseling Counseling given: Not Answered   Clinical Intake:  Pre-visit preparation completed: Yes  Pain : No/denies pain     Diabetes: No  How often do you need to have someone help you when you read instructions, pamphlets, or other written materials from your doctor or pharmacy?: 1 - Never  Diabetic?  no  Interpreter Needed?: No  Information entered by :: Leroy Kennedy LPN   Activities of Daily Living    08/08/2022    8:56 AM  In your present state of health, do you have any difficulty performing the following activities:  Hearing? 0  Vision? 0  Difficulty concentrating or making decisions? 0  Walking or climbing stairs? 0  Dressing or bathing? 0  Doing errands, shopping? 0  Preparing Food and eating ? N  Using the Toilet? N  In the past six months, have you accidently leaked urine? N  Do you have problems with loss of bowel  control? N  Managing your Medications? N  Managing your Finances? N  Housekeeping or managing your Housekeeping? N    Patient Care Team: Venita Lick, NP as PCP - General (Nurse Practitioner) Lane Hacker, [facility] (Pharmacist)  Indicate any recent Medical Services you may have received from other than [facility] providers in the past year (date may be approximate).     Assessment:   This is a routine wellness examination for Courtney Anderson.  Hearing/Vision screen Hearing Screening - Comments:: No trouble hearing Vision Screening - Comments:: [facility] Not up to date  Dietary issues and exercise activities discussed: Current Exercise Habits: Home exercise routine;Structured exercise class, Type of  exercise: walking;strength training/weights, Time (Minutes): 40, Frequency (Times/Week): 4, Weekly Exercise (Minutes/Week): 160, Intensity: Moderate   Goals Addressed             This Visit's Progress    Patient Stated   On track    08/06/2021, continue to get better     Patient Stated       Get better financially        Depression Screen    08/08/2022    8:43 AM 06/07/2022    1:07 PM 08/06/2021    8:21 AM 03/23/2021    9:14 AM 01/12/2021    8:51 AM 12/01/2020   10:17 AM 10/25/2020    3:32 PM  PHQ 2/9 Scores  PHQ - 2 Score 0  0 0 0 0 0  PHQ- 9 Score 0   0     Exception Documentation  Patient refusal         Fall Risk    08/08/2022    8:34 AM 06/07/2022    1:07 PM 08/06/2021    8:20 AM 03/23/2021    9:14 AM 12/01/2020   10:17 AM  Fall Risk   Falls in the past year? 0 0 0 0 0  Number falls in past yr: 0 0  0   Injury with Fall? 0 0  0   Risk for fall due to :  No Fall Risks Medication side effect;Impaired balance/gait    Follow up Falls evaluation completed;Education provided;Falls prevention discussed Falls evaluation completed Falls evaluation completed;Education provided;Falls prevention discussed Falls evaluation completed     FALL RISK PREVENTION PERTAINING TO THE  HOME:  Any stairs in or around the home? No  If so, are there any without handrails? No  Home free of loose throw rugs in walkways, pet beds, electrical cords, etc? Yes  Adequate lighting in your home to reduce risk of falls? Yes   ASSISTIVE DEVICES UTILIZED TO PREVENT FALLS:  Life alert? No  Use of a cane, walker or w/c? No  Grab bars in the bathroom? No  Shower chair or bench in shower? No  Elevated toilet seat or a handicapped toilet? No   TIMED UP AND GO:  Was the test performed? No .    Cognitive Function:        08/08/2022    8:36 AM 08/06/2021    8:22 AM  6CIT Screen  What Year? 0 points 0 points  What month? 0 points 0 points  What time? 0 points 0 points  Count back from 20 0 points 0 points  Months in reverse 0 points 0 points  Repeat phrase 0 points 4 points  Total Score 0 points 4 points    Immunizations Immunization History  Administered Date(s) Administered   Fluad Quad(high Dose 65+) 07/20/2021   Influenza,inj,Quad PF,6+ Mos 12/19/2016, 08/29/2017, 10/22/2019   PFIZER(Purple Top)SARS-COV-2 Vaccination 12/15/2020, 01/05/2021   Pneumococcal Conjugate-13 03/23/2021   Td 08/24/2007    TDAP status: Due, Education has been provided regarding the importance of this vaccine. Advised may receive this vaccine at local pharmacy or Health Dept. Aware to provide a copy of the vaccination record if obtained from local pharmacy or Health Dept. Verbalized acceptance and understanding.  Flu Vaccine status: Due, Education has been provided regarding the importance of this vaccine. Advised may receive this vaccine at local pharmacy or Health Dept. Aware to provide a copy of the vaccination record if obtained from local pharmacy or Health Dept. Verbalized acceptance and understanding.  Pneumococcal vaccine  status: Due, Education has been provided regarding the importance of this vaccine. Advised may receive this vaccine at local pharmacy or Health Dept. Aware to provide a  copy of the vaccination record if obtained from local pharmacy or Health Dept. Verbalized acceptance and understanding.  Covid-19 vaccine status: Information provided on how to obtain vaccines.   Qualifies for Shingles Vaccine? Yes   Zostavax completed No   Shingrix Completed?: No.    Education has been provided regarding the importance of this vaccine. Patient has been advised to call insurance company to determine out of pocket expense if they have not yet received this vaccine. Advised may also receive vaccine at local pharmacy or Health Dept. Verbalized acceptance and understanding.  Screening Tests Health Maintenance  Topic Date Due   Pneumonia Vaccine 44+ Years old (2 - PPSV23 or PCV20) 03/23/2022   INFLUENZA VACCINE  06/04/2022   COVID-19 Vaccine (3 - Pfizer series) 08/24/2022 (Originally 03/02/2021)   Zoster Vaccines- Shingrix (1 of 2) 11/08/2022 (Originally 07/24/2005)   MAMMOGRAM  08/09/2023 (Originally 10/27/2010)   DEXA SCAN  08/09/2023 (Originally 07/24/2020)   TETANUS/TDAP  08/09/2023 (Originally 08/23/2017)   COLONOSCOPY (Pts 45-56yr Insurance coverage will need to be confirmed)  10/29/2030   Hepatitis C Screening  Completed   HPV VACCINES  Aged Out    Health Maintenance  Health Maintenance Due  Topic Date Due   Pneumonia Vaccine 67 Years old (2 - PPSV23 or PCV20) 03/23/2022   INFLUENZA VACCINE  06/04/2022    Colorectal cancer screening: Type of screening: Colonoscopy. Completed 2021. Repeat every 10 years  Mammogram  declined  Bone Density declined  Lung Cancer Screening: (Low Dose CT Chest recommended if Age 67-80years, 30 pack-year currently smoking OR have quit w/in 15years.) does not qualify.   Lung Cancer Screening Referral:   Additional Screening:  Hepatitis C Screening: does not qualify; Completed 2018  Vision Screening: Recommended annual ophthalmology exams for early detection of glaucoma and other disorders of the eye. Is the patient up to date  with their annual eye exam?  No  Who is the provider or what is the name of the office in which the patient attends annual eye exams? [facility] If pt is not established with a provider, would they like to be referred to a provider to establish care? No .   Dental Screening: Recommended annual dental exams for proper oral hygiene  Community Resource Referral / Chronic Care Management: CRR required this visit?  No   CCM required this visit?  No      Plan:     I have personally reviewed and noted the following in the patient's chart:   Medical and social history Use of alcohol, tobacco or illicit drugs  Current medications and supplements including opioid prescriptions. Patient is not currently taking opioid prescriptions. Functional ability and status Nutritional status Physical activity Advanced directives List of other physicians Hospitalizations, surgeries, and ER visits in previous 12 months Vitals Screenings to include cognitive, depression, and falls Referrals and appointments  In addition, I have reviewed and discussed with patient certain preventive protocols, quality metrics, and best practice recommendations. A written personalized care plan for preventive services as well as general preventive health recommendations were provided to patient.     JLeroy Kennedy LPN   [id number]  Nurse Notes:

## 2022-08-09 ENCOUNTER — Ambulatory Visit: Payer: Medicare Other

## 2022-09-11 ENCOUNTER — Telehealth: Payer: Self-pay

## 2022-09-11 NOTE — Progress Notes (Signed)
    Chronic Care Management Pharmacy Assistant   Name: Courtney Anderson  MRN: [id number] DOB: Jun 24, 1956   Medications: Outpatient Encounter Medications as of 09/11/2022  Medication Sig   amLODipine (NORVASC) 10 MG tablet Take 1 tablet (10 mg total) by mouth daily.   Cholecalciferol (VITAMIN D) 50 MCG (2000 UT) tablet Take 2,000 Units by mouth daily.   levothyroxine (SYNTHROID) 75 MCG tablet Take 1 tablet (75 mcg total) by mouth daily before breakfast.   lisinopril (ZESTRIL) 40 MG tablet TAKE ONE TABLET BY MOUTH ONCE DAILY Needs appointment for further refills   Multiple Vitamins-Minerals (ONE-A-DAY WOMENS 50+ ADVANTAGE) TABS Take 1 tablet by mouth daily.   No facility-administered encounter medications on file as of 09/11/2022.     BP Readings from Last 3 Encounters:  06/07/22 108/76  12/07/21 (!) 160/82  07/20/21 (!) 149/66    Lab Results  Component Value Date   HGBA1C 7.1 (H) 08/03/2020    Reviewed chart for medication changes ahead of medication coordination call.  No OVs, Consults, or hospital visits since last care coordination call/Pharmacist visit.   No medication changes indicated   Patient obtains medications through Vials  90 Days   Last adherence delivery included: Amlodipine 10 mg 1 tab at breakfast Synthroid 75 mcg 1 tab at breakfast Lisinopril 40 mg 1 tab at breakfast   Patient is due for next adherence delivery on: 09/24/22.  Called patient and reviewed medications and coordinated delivery. This delivery to include: Amlodipine 10 mg 1 tab at breakfast Synthroid 75 mcg 1 tab at breakfast Lisinopril 40 mg 1 tab at breakfast   Confirmed delivery date of 09/24/22, advised patient that pharmacy will contact them the morning of delivery.    [name] [phone]

## 2022-10-11 DIAGNOSIS — E559 Vitamin D deficiency, unspecified: Secondary | ICD-10-CM | POA: Diagnosis not present

## 2022-10-11 DIAGNOSIS — E213 Hyperparathyroidism, unspecified: Secondary | ICD-10-CM | POA: Diagnosis not present

## 2022-10-11 DIAGNOSIS — E042 Nontoxic multinodular goiter: Secondary | ICD-10-CM | POA: Diagnosis not present

## 2022-10-11 DIAGNOSIS — E890 Postprocedural hypothyroidism: Secondary | ICD-10-CM | POA: Diagnosis not present

## 2022-10-11 DIAGNOSIS — I10 Essential (primary) hypertension: Secondary | ICD-10-CM | POA: Diagnosis not present

## 2022-10-11 DIAGNOSIS — I4891 Unspecified atrial fibrillation: Secondary | ICD-10-CM | POA: Diagnosis not present

## 2023-03-07 ENCOUNTER — Other Ambulatory Visit: Payer: Self-pay | Admitting: Physician Assistant

## 2023-03-07 DIAGNOSIS — E039 Hypothyroidism, unspecified: Secondary | ICD-10-CM

## 2023-03-07 DIAGNOSIS — I10 Essential (primary) hypertension: Secondary | ICD-10-CM

## 2023-03-07 NOTE — Telephone Encounter (Signed)
Requested Prescriptions  Pending Prescriptions Disp Refills   levothyroxine (SYNTHROID) 75 MCG tablet [Pharmacy Med Name: levothyroxine 75 mcg tablet] 90 tablet 0    Sig: TAKE ONE TABLET BY MOUTH BEFORE BREAKFAST     Endocrinology:  Hypothyroid Agents Failed - 03/07/2023  8:04 AM      Failed - TSH in normal range and within 360 days    TSH  Date Value Ref Range Status  12/07/2021 0.329 (L) 0.450 - 4.500 uIU/mL Final         Passed - Valid encounter within last 12 months    Recent Outpatient Visits           9 months ago Hypertension, unspecified type   [place] [facility] Mecum, Oswaldo Conroy, PA-C   1 year ago Hypothyroidism, unspecified type   [place] [facility] Vigg, Avanti, MD   1 year ago Need for influenza vaccination   [place] [facility] Vigg, Avanti, MD   1 year ago Hypothyroidism, unspecified type   [place] [facility] Vigg, Avanti, MD   2 years ago Hyperlipidemia, unspecified hyperlipidemia type   [place] [facility] Vigg, Avanti, MD               amLODipine (NORVASC) 10 MG tablet [Pharmacy Med Name: amlodipine 10 mg tablet] 90 tablet 0    Sig: TAKE ONE TABLET BY MOUTH EVERY MORNING     Cardiovascular: Calcium Channel Blockers 2 Failed - 03/07/2023  8:04 AM      Failed - Last Heart Rate in normal range    Pulse Readings from Last 1 Encounters:  06/07/22 (!) 118         Failed - Valid encounter within last 6 months    Recent Outpatient Visits           9 months ago Hypertension, unspecified type   [place] [facility] Mecum, Oswaldo Conroy, PA-C   1 year ago Hypothyroidism, unspecified type   [place] [facility] Vigg, Avanti, MD   1 year ago Need for influenza vaccination   [place] [facility] Vigg, Avanti, MD   1 year ago Hypothyroidism, unspecified type   [place] [facility] Vigg, Avanti, MD   2 years ago  Hyperlipidemia, unspecified hyperlipidemia type    [place] [facility] Vigg, Avanti, MD              Passed - Last BP in normal range    BP Readings from Last 1 Encounters:  06/07/22 108/76          lisinopril (ZESTRIL) 40 MG tablet [Pharmacy Med Name: lisinopril 40 mg tablet] 90 tablet 0    Sig: TAKE ONE TABLET BY MOUTH ONCE DAILY     Cardiovascular:  ACE Inhibitors Failed - 03/07/2023  8:04 AM      Failed - Cr in normal range and within 180 days    Creatinine, Ser  Date Value Ref Range Status  06/07/2022 0.98 0.57 - 1.00 mg/dL Final         Failed - K in normal range and within 180 days    Potassium  Date Value Ref Range Status  06/07/2022 4.2 3.5 - 5.2 mmol/L Final         Failed - Valid encounter within last 6 months    Recent Outpatient Visits           9 months ago Hypertension, unspecified type  [place] [facility] Mecum, Oswaldo Conroy, PA-C   1 year ago Hypothyroidism, unspecified type   [place] [facility] Vigg, Avanti, MD   1 year ago Need for influenza vaccination   [place] [facility] Vigg, Avanti, MD   1 year ago Hypothyroidism, unspecified type   [place] [facility] Vigg, Avanti, MD   2 years ago Hyperlipidemia, unspecified hyperlipidemia type   [place] [facility] Vigg, Avanti, MD              Passed - Patient is not pregnant      Passed - Last BP in normal range    BP Readings from Last 1 Encounters:  06/07/22 108/76

## 2023-05-22 DIAGNOSIS — E042 Nontoxic multinodular goiter: Secondary | ICD-10-CM | POA: Diagnosis not present

## 2023-05-30 DIAGNOSIS — E559 Vitamin D deficiency, unspecified: Secondary | ICD-10-CM | POA: Diagnosis not present

## 2023-05-30 DIAGNOSIS — E890 Postprocedural hypothyroidism: Secondary | ICD-10-CM | POA: Diagnosis not present

## 2023-05-30 DIAGNOSIS — E042 Nontoxic multinodular goiter: Secondary | ICD-10-CM | POA: Diagnosis not present

## 2023-05-30 DIAGNOSIS — E213 Hyperparathyroidism, unspecified: Secondary | ICD-10-CM | POA: Diagnosis not present

## 2023-06-16 ENCOUNTER — Other Ambulatory Visit: Payer: Self-pay | Admitting: Nurse Practitioner

## 2023-06-16 DIAGNOSIS — I10 Essential (primary) hypertension: Secondary | ICD-10-CM

## 2023-06-16 DIAGNOSIS — E039 Hypothyroidism, unspecified: Secondary | ICD-10-CM

## 2023-06-18 NOTE — Telephone Encounter (Signed)
OV needed for additional refills, 30 day supply given until OV can be made.  Requested Prescriptions  Pending Prescriptions Disp Refills   levothyroxine (SYNTHROID) 75 MCG tablet [Pharmacy Med Name: levothyroxine 75 mcg tablet] 30 tablet 0    Sig: TAKE ONE TABLET BY MOUTH BEFORE BREAKFAST     Endocrinology:  Hypothyroid Agents Failed - 06/16/2023 11:23 AM      Failed - TSH in normal range and within 360 days    TSH  Date Value Ref Range Status  12/07/2021 0.329 (L) 0.450 - 4.500 uIU/mL Final         Passed - Valid encounter within last 12 months    Recent Outpatient Visits           1 year ago Hypertension, unspecified type   [place] [facility] Mecum, Oswaldo Conroy, PA-C   1 year ago Hypothyroidism, unspecified type   [place] [facility] Vigg, Avanti, MD   1 year ago Need for influenza vaccination   [place] [facility] Vigg, Avanti, MD   2 years ago Hypothyroidism, unspecified type   [place] [facility] Vigg, Avanti, MD   2 years ago Hyperlipidemia, unspecified hyperlipidemia type   [place] [facility] Vigg, Avanti, MD               amLODipine (NORVASC) 10 MG tablet [Pharmacy Med Name: amlodipine 10 mg tablet] 30 tablet 0    Sig: TAKE ONE TABLET BY MOUTH EVERY MORNING     Cardiovascular: Calcium Channel Blockers 2 Failed - 06/16/2023 11:23 AM      Failed - Last Heart Rate in normal range    Pulse Readings from Last 1 Encounters:  06/07/22 (!) 118         Failed - Valid encounter within last 6 months    Recent Outpatient Visits           1 year ago Hypertension, unspecified type   [place] [facility] Mecum, Oswaldo Conroy, PA-C   1 year ago Hypothyroidism, unspecified type   [place] [facility] Vigg, Avanti, MD   1 year ago Need for influenza vaccination   [place] [facility] Vigg, Avanti, MD   2 years ago Hypothyroidism, unspecified type    [place] [facility] Vigg, Avanti, MD   2 years ago Hyperlipidemia, unspecified hyperlipidemia type   [place] [facility] Vigg, Avanti, MD              Passed - Last BP in normal range    BP Readings from Last 1 Encounters:  06/07/22 108/76          lisinopril (ZESTRIL) 40 MG tablet [Pharmacy Med Name: lisinopril 40 mg tablet] 30 tablet 0    Sig: TAKE ONE TABLET BY MOUTH ONCE DAILY     Cardiovascular:  ACE Inhibitors Failed - 06/16/2023 11:23 AM      Failed - Cr in normal range and within 180 days    Creatinine, Ser  Date Value Ref Range Status  06/07/2022 0.98 0.57 - 1.00 mg/dL Final         Failed - K in normal range and within 180 days    Potassium  Date Value Ref Range Status  06/07/2022 4.2 3.5 - 5.2 mmol/L Final         Failed - Valid encounter within last 6 months    Recent Outpatient Visits  1 year ago Hypertension, unspecified type   [place] [facility] Mecum, Oswaldo Conroy, PA-C   1 year ago Hypothyroidism, unspecified type   [place] [facility] Vigg, Avanti, MD   1 year ago Need for influenza vaccination   [place] [facility] Vigg, Avanti, MD   2 years ago Hypothyroidism, unspecified type   [place] [facility] Vigg, Avanti, MD   2 years ago Hyperlipidemia, unspecified hyperlipidemia type    [facility] Vigg, Avanti, MD              Passed - Patient is not pregnant      Passed - Last BP in normal range    BP Readings from Last 1 Encounters:  06/07/22 108/76

## 2023-09-29 ENCOUNTER — Telehealth: Payer: Self-pay | Admitting: Nurse Practitioner

## 2023-09-29 NOTE — Telephone Encounter (Signed)
Copied from CRM [id number]. Topic: Medicare AWV >> Sep 29, 2023  2:18 PM Payton Doughty wrote: Reason for CRM: Called LVM 09/29/2023 to schedule Annual Wellness Visit  Verlee Rossetti; Care Guide Ambulatory Clinical Support [facility] l [facility] Direct Dial: [phone]

## 2023-10-30 ENCOUNTER — Other Ambulatory Visit: Payer: Self-pay | Admitting: Nurse Practitioner

## 2023-10-30 DIAGNOSIS — I10 Essential (primary) hypertension: Secondary | ICD-10-CM

## 2023-10-30 NOTE — Telephone Encounter (Signed)
Medication Refill -  Most Recent Primary Care Visit:  Provider: Laurey Arrow  Department: [facility]  Visit Type: MEDICARE WELL VISIT  Date: 08/08/2022  Medication: amLODipine (NORVASC) 10 MG tablet , lisinopril (ZESTRIL) 40 MG tablet   Has the patient contacted their pharmacy? No (Agent: If yes, when and what did the pharmacy advise?)  Is this the correct pharmacy for this prescription? Yes If no, delete pharmacy and type the correct one.  This is the patient's preferred pharmacy:   [facility] [address]), [place] - [address]  [address]) [place] [address]  Phone: [phone] Fax: [phone]  Hours: Not open 24 hours    Has the prescription been filled recently? Yes  Is the patient out of the medication? Yes  Has the patient been seen for an appointment in the last year OR does the patient have an upcoming appointment? Yes  Can we respond through MyChart? No  Agent: Please be advised that Rx refills may take up to 3 business days. We ask that you follow-up with your pharmacy.

## 2023-10-31 NOTE — Telephone Encounter (Signed)
Requested medication (s) are due for refill today: yes   Requested medication (s) are on the active medication list: yes   Last refill:  06/18/23 #30 0 refills   Future visit scheduled: yes in 2 weeks   Notes to clinic:   protocol failed last labs 06/07/22 do you want to give another courtesy refill?     Requested Prescriptions  Pending Prescriptions Disp Refills   amLODipine (NORVASC) 10 MG tablet 30 tablet 0    Sig: Take 1 tablet (10 mg total) by mouth every morning.     Cardiovascular: Calcium Channel Blockers 2 Failed - 10/31/2023  1:51 PM      Failed - Last Heart Rate in normal range    Pulse Readings from Last 1 Encounters:  06/07/22 (!) 118         Failed - Valid encounter within last 6 months    Recent Outpatient Visits           1 year ago Hypertension, unspecified type   [place] [facility] Mecum, Oswaldo Conroy, PA-C   1 year ago Hypothyroidism, unspecified type   [place] [facility] Vigg, Avanti, MD   2 years ago Need for influenza vaccination   [place] [facility] Vigg, Avanti, MD   2 years ago Hypothyroidism, unspecified type   [place] [facility] Vigg, Avanti, MD   2 years ago Hyperlipidemia, unspecified hyperlipidemia type   [place] [facility] Vigg, Avanti, MD       Future Appointments             In 2 weeks Cannady, Dorie Rank, NP [place] [facility]            Passed - Last BP in normal range    BP Readings from Last 1 Encounters:  06/07/22 108/76          lisinopril (ZESTRIL) 40 MG tablet 30 tablet 0    Sig: Take 1 tablet (40 mg total) by mouth daily.     Cardiovascular:  ACE Inhibitors Failed - 10/31/2023  1:51 PM      Failed - Cr in normal range and within 180 days    Creatinine, Ser  Date Value Ref Range Status  06/07/2022 0.98 0.57 - 1.00 mg/dL Final         Failed - K in normal range and within 180 days    Potassium  Date Value Ref  Range Status  06/07/2022 4.2 3.5 - 5.2 mmol/L Final         Failed - Valid encounter within last 6 months    Recent Outpatient Visits           1 year ago Hypertension, unspecified type   [place] [facility] Mecum, Oswaldo Conroy, PA-C   1 year ago Hypothyroidism, unspecified type   [place] [facility] Vigg, Avanti, MD   2 years ago Need for influenza vaccination   [place] [facility] Vigg, Avanti, MD   2 years ago Hypothyroidism, unspecified type   [place] [facility] Vigg, Avanti, MD   2 years ago Hyperlipidemia, unspecified hyperlipidemia type   [place] [facility] Vigg, Avanti, MD       Future Appointments             In 2 weeks Cannady, Dorie Rank, NP [place] [facility]  Passed - Patient is not pregnant      Passed - Last BP in normal range    BP Readings from Last 1 Encounters:  06/07/22 108/76

## 2023-11-01 ENCOUNTER — Other Ambulatory Visit: Payer: Self-pay | Admitting: Physician Assistant

## 2023-11-01 DIAGNOSIS — I10 Essential (primary) hypertension: Secondary | ICD-10-CM

## 2023-11-03 ENCOUNTER — Other Ambulatory Visit: Payer: Self-pay | Admitting: Nurse Practitioner

## 2023-11-03 DIAGNOSIS — I10 Essential (primary) hypertension: Secondary | ICD-10-CM

## 2023-11-03 MED ORDER — AMLODIPINE BESYLATE 10 MG PO TABS
10.0000 mg | ORAL_TABLET | Freq: Every morning | ORAL | 3 refills | Status: DC
Start: 2023-11-03 — End: 2023-11-17

## 2023-11-03 MED ORDER — LISINOPRIL 40 MG PO TABS
40.0000 mg | ORAL_TABLET | Freq: Every day | ORAL | 0 refills | Status: DC
Start: 2023-11-03 — End: 2023-11-17

## 2023-11-03 NOTE — Telephone Encounter (Signed)
LMOM for patient. Request was sent electronically to a provider no longer in office. That being said her original script was a 30 day back in August so she should have requested a refill much sooner then on 11/01/2023 which being a Saturday the office is closed. Request has been sent to her PCP and likely will be completed today but she should be reminded of the 24-72 hours for requests to be addressed.   Ok for [facility] to review.

## 2023-11-03 NOTE — Telephone Encounter (Signed)
Patient called stated she is out of amLODipine (NORVASC) 10 MG tablet and is requesting provider call her immediately as it should not take this long for a refill. She was advised of the 48-72 hrs turn around time frame for refills and her med was requested on 12/28

## 2023-11-03 NOTE — Telephone Encounter (Signed)
Duplicate message. 

## 2023-11-03 NOTE — Telephone Encounter (Signed)
Pt called back to let you know she does need the lisinopril (ZESTRIL) 40 MG tablet . Pt states she takes the 2 bp meds together and would appreciate asap. Last written by Denny Peon.  [id number] - [place] ([address]), [place] - [address]

## 2023-11-06 ENCOUNTER — Ambulatory Visit: Payer: Medicare Other | Admitting: Emergency Medicine

## 2023-11-06 VITALS — Ht 65.0 in | Wt 226.0 lb

## 2023-11-06 DIAGNOSIS — Z1231 Encounter for screening mammogram for malignant neoplasm of breast: Secondary | ICD-10-CM

## 2023-11-06 DIAGNOSIS — Z780 Asymptomatic menopausal state: Secondary | ICD-10-CM | POA: Diagnosis not present

## 2023-11-06 DIAGNOSIS — Z0000 Encounter for general adult medical examination without abnormal findings: Secondary | ICD-10-CM | POA: Diagnosis not present

## 2023-11-06 NOTE — Patient Instructions (Addendum)
 Courtney Anderson , Thank you for taking time to come for your Medicare Wellness Visit. I appreciate your ongoing commitment to your health goals. Please review the following plan we discussed and let me know if I can assist you in the future.   Referrals/Orders/Follow-Ups/Clinician Recommendations: I have placed orders for you to have a mammogram and bone density test. Call [facility] @ [phone] to schedule at your earliest convenience. Ask to have them scheduled at the same visit. Get the flu vaccine at your OV on 11/17/23. Get the tetanus and pneumonia vaccines at your convenience.  This is a list of the screening recommended for you and due dates:  Health Maintenance  Topic Date Due   Zoster (Shingles) Vaccine (1 of 2) Never done   Mammogram  10/27/2010   DTaP/Tdap/Td vaccine (2 - Tdap) 08/23/2017   DEXA scan (bone density measurement)  Never done   Pneumonia Vaccine (2 of 2 - PPSV23 or PCV20) 03/23/2022   COVID-19 Vaccine (3 - 2024-25 season) 07/06/2023   Flu Shot  02/02/2024*   Medicare Annual Wellness Visit  11/05/2024   Colon Cancer Screening  10/29/2030   Hepatitis C Screening  Completed   HPV Vaccine  Aged Out  *Topic was postponed. The date shown is not the original due date.    Advanced directives: (Copy Requested) Please bring a copy of your health care power of attorney and living will to the office to be added to your chart at your convenience.  Next Medicare Annual Wellness Visit scheduled for next year: Yes, 11/09/24 @ 3:50pm

## 2023-11-06 NOTE — Progress Notes (Signed)
 Subjective:   Courtney Anderson is a 68 y.o. female who presents for Medicare Annual (Subsequent) preventive examination.  Interactive audio and video telecommunications were attempted between this provider and patient, however failed, due to patient having technical difficulties OR patient did not have access to video capability.  We continued and completed visit with audio only.   Visit Complete: Virtual I connected with  Courtney Anderson on 11/06/23 by a audio enabled telemedicine application and verified that I am speaking with the correct person using two identifiers.  Patient Location: Home  Provider Location: Home Office  I discussed the limitations of evaluation and management by telemedicine. The patient expressed understanding and agreed to proceed.  Vital Signs: Because this visit was a virtual/telehealth visit, some criteria may be missing or patient reported. Any vitals not documented were not able to be obtained and vitals that have been documented are patient reported.   Cardiac Risk Factors include: advanced age (>69men, >53 women);hypertension;dyslipidemia;obesity (BMI >30kg/m2)     Objective:    Today's Vitals   11/06/23 1554  Weight: 226 lb (102.5 kg)  Height: 5' 5 (1.651 m)   Body mass index is 37.61 kg/m.     11/06/2023    4:12 PM 08/08/2022    8:37 AM 08/06/2021    8:19 AM 10/29/2020    7:21 AM 10/27/2020    4:00 PM 10/27/2020   12:23 PM 07/24/2020    9:04 AM  Advanced Directives  Does Patient Have a Medical Advance Directive? No;Yes No No No  Yes No  Type of Diplomatic Services Operational Officer;Living will    Healthcare Power of [employer] Power of Attorney   Does patient want to make changes to medical advance directive? No - Patient declined        Copy of Healthcare Power of Attorney in Chart? No - copy requested    No - copy requested    Would patient like information on creating a medical advance directive?  No - Patient declined   No - Patient declined   No - Patient declined    Current Medications (verified) Outpatient Encounter Medications as of 11/06/2023  Medication Sig   amLODipine  (NORVASC ) 10 MG tablet Take 1 tablet (10 mg total) by mouth every morning.   levothyroxine  (SYNTHROID ) 75 MCG tablet TAKE ONE TABLET BY MOUTH BEFORE BREAKFAST   lisinopril  (ZESTRIL ) 40 MG tablet Take 1 tablet (40 mg total) by mouth daily.   Multiple Vitamins-Minerals (ONE-A-DAY WOMENS 50+ ADVANTAGE) TABS Take 1 tablet by mouth daily.   amLODipine  (NORVASC ) 10 MG tablet Take 1 tablet by mouth once daily (Patient not taking: Reported on 11/06/2023)   Cholecalciferol  (VITAMIN D ) 50 MCG (2000 UT) tablet Take 2,000 Units by mouth daily. (Patient not taking: Reported on 11/06/2023)   No facility-administered encounter medications on file as of 11/06/2023.    Allergies (verified) Codeine   History: Past Medical History:  Diagnosis Date   Allergy    COVID-19    Elevated transaminase level    Hyperlipidemia    Hypertension    Obesity    Past Surgical History:  Procedure Laterality Date   BREAST SURGERY     COLONOSCOPY WITH PROPOFOL  N/A 10/29/2020   Procedure: COLONOSCOPY WITH PROPOFOL ;  Surgeon: Unk Corinn Skiff, MD;  Location: [facility] END[facility];  Service: Gastroenterology;  Laterality: N/A;   ECTOPIC PREGNANCY SURGERY     ESOPHAGOGASTRODUODENOSCOPY (EGD) WITH PROPOFOL  N/A 10/29/2020   Procedure: ESOPHAGOGASTRODUODENOSCOPY (EGD) WITH PROPOFOL ;  Surgeon: Unk,  Corinn Skiff, MD;  Location: [facility];  Service: Gastroenterology;  Laterality: N/A;   Family History  Problem Relation Age of Onset   Hypertension Mother    Arthritis Mother    Hypertension Father    Stroke Father    Aneurysm Sister    Hypertension Sister    Hypertension Sister    Hypertension Brother    Hypertension Brother    Social History   Socioeconomic History   Marital status: Divorced    Spouse name: Not on file   Number of children: 2   Years of  education: Not on file   Highest education level: Not on file  Occupational History   Occupation: retired  Tobacco Use   Smoking status: Former    Current packs/day: 0.00    Average packs/day: 0.5 packs/day for 25.0 years (12.5 ttl pk-yrs)    Types: Cigarettes    Start date: 71    Quit date: 2005    Years since quitting: 20.0   Smokeless tobacco: Never  Vaping Use   Vaping status: Never Used  Substance and Sexual Activity   Alcohol use: Not Currently   Drug use: No   Sexual activity: Not Currently  Other Topics Concern   Not on file  Social History Narrative   Not on file   Social Drivers of Health   Financial Resource Strain: Medium Risk (11/06/2023)   Overall Financial Resource Strain (CARDIA)    Difficulty of Paying Living Expenses: Somewhat hard  Food Insecurity: No Food Insecurity (11/06/2023)   Hunger Vital Sign    Worried About Running Out of Food in the Last Year: Never true    Ran Out of Food in the Last Year: Never true  Transportation Needs: No Transportation Needs (11/06/2023)   PRAPARE - Administrator, Civil Service (Medical): No    Lack of Transportation (Non-Medical): No  Physical Activity: Sufficiently Active (11/06/2023)   Exercise Vital Sign    Days of Exercise per Week: 7 days    Minutes of Exercise per Session: 30 min  Stress: No Stress Concern Present (11/06/2023)   [employer] of Occupational Health - Occupational Stress Questionnaire    Feeling of Stress : Not at all  Social Connections: Moderately Isolated (11/06/2023)   Social Connection and Isolation Panel [NHANES]    Frequency of Communication with Friends and Family: More than three times a week    Frequency of Social Gatherings with Friends and Family: More than three times a week    Attends Religious Services: 1 to 4 times per year    Active Member of [employer] or Organizations: No    Attends Engineer, Structural: Never    Marital Status: Divorced    Tobacco  Counseling Counseling given: Not Answered   Clinical Intake:  Pre-visit preparation completed: Yes  Pain : No/denies pain     BMI - recorded: 37.61 Nutritional Status: BMI > 30  Obese Nutritional Risks: None Diabetes: No  How often do you need to have someone help you when you read instructions, pamphlets, or other written materials from your doctor or pharmacy?: 1 - Never  Interpreter Needed?: No  Information entered by :: Vina Ned, CMA   Activities of Daily Living    11/06/2023    3:57 PM  In your present state of health, do you have any difficulty performing the following activities:  Hearing? 0  Vision? 0  Difficulty concentrating or making decisions? 0  Walking or climbing stairs? 0  Dressing or bathing? 0  Doing errands, shopping? 0  Preparing Food and eating ? N  Using the Toilet? N  In the past six months, have you accidently leaked urine? N  Do you have problems with loss of bowel control? N  Managing your Medications? N  Managing your Finances? N  Housekeeping or managing your Housekeeping? N    Patient Care Team: Cannady, Jolene T, NP as PCP - General (Nurse Practitioner) Nyle Rankin [facility], [facility] (Inactive) (Pharmacist)  Indicate any recent Medical Services you may have received from other than [facility] providers in the past year (date may be approximate).     Assessment:   This is a routine wellness examination for Kysa.  Hearing/Vision screen Hearing Screening - Comments:: Denies hearing loss Vision Screening - Comments:: Gets eye exams   Goals Addressed             This Visit's Progress    Weight (lb) < 200 lb (90.7 kg)   226 lb (102.5 kg)     Depression Screen    11/06/2023    4:04 PM 08/08/2022    8:43 AM 06/07/2022    1:07 PM 08/06/2021    8:21 AM 03/23/2021    9:14 AM 01/12/2021    8:51 AM 12/01/2020   10:17 AM  PHQ 2/9 Scores  PHQ - 2 Score 0 0  0 0 0 0  PHQ- 9 Score  0   0    Exception Documentation   Patient refusal         Fall Risk    11/06/2023    4:14 PM 08/08/2022    8:34 AM 06/07/2022    1:07 PM 08/06/2021    8:20 AM 03/23/2021    9:14 AM  Fall Risk   Falls in the past year? 0 0 0 0 0  Number falls in past yr: 0 0 0  0  Injury with Fall? 0 0 0  0  Risk for fall due to : No Fall Risks  No Fall Risks Medication side effect;Impaired balance/gait   Follow up Falls prevention discussed Falls evaluation completed;Education provided;Falls prevention discussed Falls evaluation completed Falls evaluation completed;Education provided;Falls prevention discussed Falls evaluation completed    MEDICARE RISK AT HOME: Medicare Risk at Home Any stairs in or around the home?: Yes If so, are there any without handrails?: No Home free of loose throw rugs in walkways, pet beds, electrical cords, etc?: Yes Adequate lighting in your home to reduce risk of falls?: Yes Life alert?: No Use of a cane, walker or w/c?: No Grab bars in the bathroom?: No Shower chair or bench in shower?: No Elevated toilet seat or a handicapped toilet?: No  TIMED UP AND GO:  Was the test performed?  No    Cognitive Function:        11/06/2023    4:15 PM 08/08/2022    8:36 AM 08/06/2021    8:22 AM  6CIT Screen  What Year? 0 points 0 points 0 points  What month? 0 points 0 points 0 points  What time? 0 points 0 points 0 points  Count back from 20 0 points 0 points 0 points  Months in reverse 0 points 0 points 0 points  Repeat phrase 0 points 0 points 4 points  Total Score 0 points 0 points 4 points    Immunizations Immunization History  Administered Date(s) Administered   Fluad Quad(high Dose 65+) 07/20/2021   Influenza,inj,Quad PF,6+ Mos 12/19/2016, 08/29/2017, 10/22/2019  PFIZER(Purple Top)SARS-COV-2 Vaccination 12/15/2020, 01/05/2021   Pneumococcal Conjugate-13 03/23/2021   Td 08/24/2007    TDAP status: Due, Education has been provided regarding the importance of this vaccine. Advised may receive this vaccine at local  pharmacy or Health Dept. Aware to provide a copy of the vaccination record if obtained from local pharmacy or Health Dept. Verbalized acceptance and understanding.  Flu Vaccine status: Due, Education has been provided regarding the importance of this vaccine. Advised may receive this vaccine at local pharmacy or Health Dept. Aware to provide a copy of the vaccination record if obtained from local pharmacy or Health Dept. Verbalized acceptance and understanding.  Pneumococcal vaccine status: Due, Education has been provided regarding the importance of this vaccine. Advised may receive this vaccine at local pharmacy or Health Dept. Aware to provide a copy of the vaccination record if obtained from local pharmacy or Health Dept. Verbalized acceptance and understanding.  Covid-19 vaccine status: Information provided on how to obtain vaccines.   Qualifies for Shingles Vaccine? Yes   Zostavax completed No   Shingrix Completed?: No.    Education has been provided regarding the importance of this vaccine. Patient has been advised to call insurance company to determine out of pocket expense if they have not yet received this vaccine. Advised may also receive vaccine at local pharmacy or Health Dept. Verbalized acceptance and understanding. Patient declined.  Screening Tests Health Maintenance  Topic Date Due   Zoster Vaccines- Shingrix (1 of 2) Never done   MAMMOGRAM  10/27/2010   DTaP/Tdap/Td (2 - Tdap) 08/23/2017   DEXA SCAN  Never done   Pneumonia Vaccine 45+ Years old (2 of 2 - PPSV23 or PCV20) 03/23/2022   INFLUENZA VACCINE  06/05/2023   COVID-19 Vaccine (3 - 2024-25 season) 07/06/2023   Medicare Annual Wellness (AWV)  11/05/2024   Colonoscopy  10/29/2030   Hepatitis C Screening  Completed   HPV VACCINES  Aged Out    Health Maintenance  Health Maintenance Due  Topic Date Due   Zoster Vaccines- Shingrix (1 of 2) Never done   MAMMOGRAM  10/27/2010   DTaP/Tdap/Td (2 - Tdap) 08/23/2017    DEXA SCAN  Never done   Pneumonia Vaccine 94+ Years old (2 of 2 - PPSV23 or PCV20) 03/23/2022   INFLUENZA VACCINE  06/05/2023   COVID-19 Vaccine (3 - 2024-25 season) 07/06/2023    Colorectal cancer screening: Type of screening: Colonoscopy. Completed 10/29/20. Repeat every 10 years  Mammogram status: Ordered 11/06/23. Pt provided with contact info and advised to call to schedule appt.   Bone Density status: Ordered 11/06/23. Pt provided with contact info and advised to call to schedule appt.  Lung Cancer Screening: (Low Dose CT Chest recommended if Age 12-80 years, 20 pack-year currently smoking OR have quit w/in 15years.) does not qualify.   Lung Cancer Screening Referral: n/a  Additional Screening:  Hepatitis C Screening: does not qualify; Completed 12/19/16  Vision Screening: Recommended annual ophthalmology exams for early detection of glaucoma and other disorders of the eye. Dental Screening: Recommended annual dental exams for proper oral hygiene   Community Resource Referral / Chronic Care Management: CRR required this visit?  No   CCM required this visit?  No     Plan:     I have personally reviewed and noted the following in the patient's chart:   Medical and social history Use of alcohol, tobacco or illicit drugs  Current medications and supplements including opioid prescriptions. Patient is not currently taking opioid  prescriptions. Functional ability and status Nutritional status Physical activity Advanced directives List of other physicians Hospitalizations, surgeries, and ER visits in previous 12 months Vitals Screenings to include cognitive, depression, and falls Referrals and appointments  In addition, I have reviewed and discussed with patient certain preventive protocols, quality metrics, and best practice recommendations. A written personalized care plan for preventive services as well as general preventive health recommendations were provided to  patient.     Vina Ned, CMA   11/06/2023   After Visit Summary: (Mail) Due to this being a telephonic visit, the after visit summary with patients personalized plan was offered to patient via mail   Nurse Notes:  Wants flu shot at OV 11/17/23 Needs Tdap and pneumonia vaccines. Declined shingles vaccines. Placed orders for MMG and DEXA scan

## 2023-11-15 NOTE — Patient Instructions (Signed)
Please call to schedule your mammogram and/or bone density: [facility] at [facility]  Address: [address], [place], [place] [address] Phone: [phone]  [facility] [place] [address] [place],  [place]  [address] Phone: [phone]    DASH Eating Plan DASH stands for Dietary Approaches to Stop Hypertension. The DASH eating plan is a healthy eating plan that has been shown to: Lower high blood pressure (hypertension). Reduce your risk for type 2 diabetes, heart disease, and stroke. Help with weight loss. What are tips for following this plan? Reading food labels Check food labels for the amount of salt (sodium) per serving. Choose foods with less than 5 percent of the Daily Value (DV) of sodium. In general, foods with less than 300 milligrams (mg) of sodium per serving fit into this eating plan. To find whole grains, look for the word "whole" as the first word in the ingredient list. Shopping Buy products labeled as "low-sodium" or "no salt added." Buy fresh foods. Avoid canned foods and pre-made or frozen meals. Cooking Try not to add salt when you cook. Use salt-free seasonings or herbs instead of table salt or sea salt. Check with your health care provider or pharmacist before using salt substitutes. Do not fry foods. Cook foods in healthy ways, such as baking, boiling, grilling, roasting, or broiling. Cook using oils that are good for your heart. These include olive, canola, avocado, soybean, and sunflower oil. Meal planning  Eat a balanced diet. This should include: 4 or more servings of fruits and 4 or more servings of vegetables each day. Try to fill half of your plate with fruits and vegetables. 6-8 servings of whole grains each day. 6 or less servings of lean meat, poultry, or fish each day. 1 oz is 1 serving. A 3 oz (85 g) serving of meat is about the same size as the palm of your hand. One egg is 1 oz (28  g). 2-3 servings of low-fat dairy each day. One serving is 1 cup (237 mL). 1 serving of nuts, seeds, or beans 5 times each week. 2-3 servings of heart-healthy fats. Healthy fats called omega-3 fatty acids are found in foods such as walnuts, flaxseeds, fortified milks, and eggs. These fats are also found in cold-water fish, such as sardines, salmon, and mackerel. Limit how much you eat of: Canned or prepackaged foods. Food that is high in trans fat, such as fried foods. Food that is high in saturated fat, such as fatty meat. Desserts and other sweets, sugary drinks, and other foods with added sugar. Full-fat dairy products. Do not salt foods before eating. Do not eat more than 4 egg yolks a week. Try to eat at least 2 vegetarian meals a week. Eat more home-cooked food and less restaurant, buffet, and fast food. Lifestyle When eating at a restaurant, ask if your food can be made with less salt or no salt. If you drink alcohol: Limit how much you have to: 0-1 drink a day if you are female. 0-2 drinks a day if you are female. Know how much alcohol is in your drink. In the [place] one drink is one 12 oz bottle of beer (355 mL), one 5 oz glass of wine (148 mL), or one 1 oz glass of hard liquor (44 mL). General information Avoid eating more than 2,300 mg of salt a day. If you have hypertension, you may need to reduce your sodium intake to 1,500 mg   a day. Work with your provider to stay at a healthy body weight or lose weight. Ask what the best weight range is for you. On most days of the week, get at least 30 minutes of exercise that causes your heart to beat faster. This may include walking, swimming, or biking. Work with your provider or dietitian to adjust your eating plan to meet your specific calorie needs. What foods should I eat? Fruits All fresh, dried, or frozen fruit. Canned fruits that are in their natural juice and do not have sugar added to them. Vegetables Fresh or frozen vegetables  that are raw, steamed, roasted, or grilled. Low-sodium or reduced-sodium tomato and vegetable juice. Low-sodium or reduced-sodium tomato sauce and tomato paste. Low-sodium or reduced-sodium canned vegetables. Grains Whole-grain or whole-wheat bread. Whole-grain or whole-wheat pasta. Brown rice. Oatmeal. Quinoa. Bulgur. Whole-grain and low-sodium cereals. Pita bread. Low-fat, low-sodium crackers. Whole-wheat flour tortillas. Meats and other proteins Skinless chicken or turkey. Ground chicken or turkey. Pork with fat trimmed off. Fish and seafood. Egg whites. Dried beans, peas, or lentils. Unsalted nuts, nut butters, and seeds. Unsalted canned beans. Lean cuts of beef with fat trimmed off. Low-sodium, lean precooked or cured meat, such as sausages or meat loaves. Dairy Low-fat (1%) or fat-free (skim) milk. Reduced-fat, low-fat, or fat-free cheeses. Nonfat, low-sodium ricotta or cottage cheese. Low-fat or nonfat yogurt. Low-fat, low-sodium cheese. Fats and oils Soft margarine without trans fats. Vegetable oil. Reduced-fat, low-fat, or light mayonnaise and salad dressings (reduced-sodium). Canola, safflower, olive, avocado, soybean, and sunflower oils. Avocado. Seasonings and condiments Herbs. Spices. Seasoning mixes without salt. Other foods Unsalted popcorn and pretzels. Fat-free sweets. The items listed above may not be all the foods and drinks you can have. Talk to a dietitian to learn more. What foods should I avoid? Fruits Canned fruit in a light or heavy syrup. Fried fruit. Fruit in cream or butter sauce. Vegetables Creamed or fried vegetables. Vegetables in a cheese sauce. Regular canned vegetables that are not marked as low-sodium or reduced-sodium. Regular canned tomato sauce and paste that are not marked as low-sodium or reduced-sodium. Regular tomato and vegetable juices that are not marked as low-sodium or reduced-sodium. Pickles. Olives. Grains Baked goods made with fat, such as  croissants, muffins, or some breads. Dry pasta or rice meal packs. Meats and other proteins Fatty cuts of meat. Ribs. Fried meat. Bacon. Bologna, salami, and other precooked or cured meats, such as sausages or meat loaves, that are not lean and low in sodium. Fat from the back of a pig (fatback). Bratwurst. Salted nuts and seeds. Canned beans with added salt. Canned or smoked fish. Whole eggs or egg yolks. Chicken or turkey with skin. Dairy Whole or 2% milk, cream, and half-and-half. Whole or full-fat cream cheese. Whole-fat or sweetened yogurt. Full-fat cheese. Nondairy creamers. Whipped toppings. Processed cheese and cheese spreads. Fats and oils Butter. Stick margarine. Lard. Shortening. Ghee. Bacon fat. Tropical oils, such as coconut, palm kernel, or palm oil. Seasonings and condiments Onion salt, garlic salt, seasoned salt, table salt, and sea salt. Worcestershire sauce. Tartar sauce. Barbecue sauce. Teriyaki sauce. Soy sauce, including reduced-sodium soy sauce. Steak sauce. Canned and packaged gravies. Fish sauce. Oyster sauce. Cocktail sauce. Store-bought horseradish. Ketchup. Mustard. Meat flavorings and tenderizers. Bouillon cubes. Hot sauces. Pre-made or packaged marinades. Pre-made or packaged taco seasonings. Relishes. Regular salad dressings. Other foods Salted popcorn and pretzels. The items listed above may not be all the foods and drinks you should avoid. Talk to a   dietitian to learn more. Where to find more information National Heart, Lung, and Blood Institute (NHLBI): nhlbi.nih.gov American Heart Association (AHA): heart.org Academy of Nutrition and Dietetics: eatright.org National Kidney Foundation (NKF): kidney.org This information is not intended to replace advice given to you by your health care provider. Make sure you discuss any questions you have with your health care provider. Document Revised: 11/07/2022 Document Reviewed: 11/07/2022 Elsevier Patient Education  2024  Elsevier Inc.  

## 2023-11-17 ENCOUNTER — Encounter: Payer: Self-pay | Admitting: Nurse Practitioner

## 2023-11-17 ENCOUNTER — Ambulatory Visit (INDEPENDENT_AMBULATORY_CARE_PROVIDER_SITE_OTHER): Payer: Medicare Other | Admitting: Nurse Practitioner

## 2023-11-17 VITALS — BP 122/76 | HR 64 | Temp 97.9°F | Ht 65.0 in | Wt 228.4 lb

## 2023-11-17 DIAGNOSIS — Z1231 Encounter for screening mammogram for malignant neoplasm of breast: Secondary | ICD-10-CM

## 2023-11-17 DIAGNOSIS — Z23 Encounter for immunization: Secondary | ICD-10-CM

## 2023-11-17 DIAGNOSIS — E042 Nontoxic multinodular goiter: Secondary | ICD-10-CM

## 2023-11-17 DIAGNOSIS — E782 Mixed hyperlipidemia: Secondary | ICD-10-CM

## 2023-11-17 DIAGNOSIS — E559 Vitamin D deficiency, unspecified: Secondary | ICD-10-CM | POA: Diagnosis not present

## 2023-11-17 DIAGNOSIS — I10 Essential (primary) hypertension: Secondary | ICD-10-CM

## 2023-11-17 DIAGNOSIS — E210 Primary hyperparathyroidism: Secondary | ICD-10-CM | POA: Diagnosis not present

## 2023-11-17 DIAGNOSIS — E039 Hypothyroidism, unspecified: Secondary | ICD-10-CM | POA: Diagnosis not present

## 2023-11-17 DIAGNOSIS — Z780 Asymptomatic menopausal state: Secondary | ICD-10-CM | POA: Diagnosis not present

## 2023-11-17 DIAGNOSIS — I4891 Unspecified atrial fibrillation: Secondary | ICD-10-CM | POA: Diagnosis not present

## 2023-11-17 LAB — MICROALBUMIN, URINE WAIVED
Creatinine, Urine Waived: 200 mg/dL (ref 10–300)
Microalb, Ur Waived: 150 mg/L — ABNORMAL HIGH (ref 0–19)

## 2023-11-17 MED ORDER — LISINOPRIL 40 MG PO TABS: 40.0000 mg | ORAL_TABLET | Freq: Every day | ORAL | 4 refills | Status: DC

## 2023-11-17 MED ORDER — LEVOTHYROXINE SODIUM 75 MCG PO TABS: 75.0000 ug | ORAL_TABLET | Freq: Every day | ORAL | 4 refills | Status: DC

## 2023-11-17 MED ORDER — AMLODIPINE BESYLATE 10 MG PO TABS: 10.0000 mg | ORAL_TABLET | Freq: Every morning | ORAL | 4 refills | Status: DC

## 2023-11-17 NOTE — Assessment & Plan Note (Signed)
 BMI 38.01, with HTN, A-FIB, HLD.  Recommended eating smaller high protein, low fat meals more frequently and exercising 30 mins a day 5 times a week with a goal of 10-15lb weight loss in the next 3 months. Patient voiced their understanding and motivation to adhere to these recommendations.

## 2023-11-17 NOTE — Assessment & Plan Note (Signed)
 Chronic, ongoing.  BP at goal in office today.  Recommend she monitor BP at least a few mornings a week at home and document.  DASH diet at home.  Continue current medication regimen and adjust as needed.  Labs today: CBC, CMP, Lipid, urine ALB.  Urine ALB 150 January 2025, continue ACE.  Return in 6 months.

## 2023-11-17 NOTE — Assessment & Plan Note (Signed)
 Chronic, stable.  Followed by endocrinology.  Continue this collaboration, recent note and labs reviewed.

## 2023-11-17 NOTE — Assessment & Plan Note (Signed)
 Chronic, stable.   No DOAC or ASA due to past GI bleed.  Continue to collaborate with cardiology, appreciate their input, she is to return as needed based on last note, however may benefit annual visits.  Labs today: CBC, Lipid, and CMP.

## 2023-11-17 NOTE — Assessment & Plan Note (Signed)
 Chronic, ongoing.  No current statin.  Discussed with her she may benefit medication therapy, which she would prefer not to do.  Recheck labs today.

## 2023-11-17 NOTE — Assessment & Plan Note (Signed)
 Chronic, stable.  Followed by endocrinology, recent note and labs reviewed.  Continue this collaboration Check CMP and PTH today.

## 2023-11-17 NOTE — Progress Notes (Signed)
 BP 122/76   Pulse 64   Temp 97.9 F (36.6 C) (Oral)   Ht 5' 5 (1.651 m)   Wt 228 lb 6.4 oz (103.6 kg)   SpO2 96%   BMI 38.01 kg/m    Subjective:    Patient ID: Lenward LITTIE Epp, female    DOB: 05-23-1956, 68 y.o.   MRN: [id number]  HPI: BASSY FETTERLY is a 68 y.o. female  Chief Complaint  Patient presents with   Hyperlipidemia   Hypertension   Hypothyroidism   HYPERTENSION / HYPERLIPIDEMIA Continues on Amlodipine  and Lisinopril , no statin therapy. Satisfied with current treatment? yes Duration of hypertension: chronic BP monitoring frequency: daily BP range: <130/80 on average BP medication side effects: no Duration of hyperlipidemia: chronic Cholesterol supplements: none Aspirin : no Recent stressors: no Recurrent headaches: no Visual changes: no Palpitations: no Dyspnea: no Chest pain: no Lower extremity edema: no Dizzy/lightheaded: no   ATRIAL FIBRILLATION Diagnosed with this due to Covid, 10/27/20. She had a GI blood with blood thinners in past. Atrial fibrillation status: stable Satisfied with current treatment: yes  Medication side effects:  no Medication compliance: good compliance Etiology of atrial fibrillation: Covid Palpitations:  no Chest pain:  no Dyspnea on exertion:  no Orthopnea:  no Syncope:  no Edema:  no Ventricular rate control: Not indicated Anti-coagulation: not indicated can not take due to past GI bleed  HYPOTHYROIDISM Taking Levothyroxine  75 MCG daily, follows with endocrinology due to past history and last visit was 05/30/23.  Ultrasound performed and remains stable. Thyroid  control status:stable Satisfied with current treatment? yes Medication side effects: no Medication compliance: good compliance Etiology of hypothyroidism:  Recent dose adjustment:no Fatigue: no Cold intolerance: no Heat intolerance: no Weight gain: no Weight loss: no Constipation: yes occasional Diarrhea/loose stools: no Palpitations: no Lower  extremity edema: no Anxiety/depressed mood: no     11/17/2023   11:19 AM 11/06/2023    4:04 PM 08/08/2022    8:43 AM 08/06/2021    8:21 AM 03/23/2021    9:14 AM  Depression screen PHQ 2/9  Decreased Interest 0 0 0 0 0  Down, Depressed, Hopeless 0 0 0 0 0  PHQ - 2 Score 0 0 0 0 0  Altered sleeping 0  0  0  Tired, decreased energy 0  0  0  Change in appetite 0  0  0  Feeling bad or failure about yourself  0  0  0  Trouble concentrating 0  0  0  Moving slowly or fidgety/restless 0  0  0  Suicidal thoughts 0  0  0  PHQ-9 Score 0  0  0  Difficult doing work/chores Not difficult at all  Not difficult at all  Not difficult at all       11/17/2023   11:18 AM  GAD 7 : Generalized Anxiety Score  Nervous, Anxious, on Edge 0  Control/stop worrying 0  Worry too much - different things 0  Trouble relaxing 0  Restless 0  Easily annoyed or irritable 0  Afraid - awful might happen 0  Total GAD 7 Score 0  Anxiety Difficulty Not difficult at all     Relevant past medical, surgical, family and social history reviewed and updated as indicated. Interim medical history since our last visit reviewed. Allergies and medications reviewed and updated.  Review of Systems  Constitutional:  Negative for activity change, appetite change, diaphoresis, fatigue and fever.  Respiratory:  Negative for cough, chest tightness and shortness  of breath.   Cardiovascular:  Negative for chest pain, palpitations and leg swelling.  Gastrointestinal:  Positive for constipation. Negative for abdominal distention, abdominal pain, diarrhea, nausea and vomiting.  Endocrine: Negative for cold intolerance and heat intolerance.  Neurological: Negative.   Psychiatric/Behavioral: Negative.      Per HPI unless specifically indicated above     Objective:    BP 122/76   Pulse 64   Temp 97.9 F (36.6 C) (Oral)   Ht 5' 5 (1.651 m)   Wt 228 lb 6.4 oz (103.6 kg)   SpO2 96%   BMI 38.01 kg/m   Wt Readings from Last 3  Encounters:  11/17/23 228 lb 6.4 oz (103.6 kg)  11/06/23 226 lb (102.5 kg)  06/07/22 226 lb 9.6 oz (102.8 kg)    Physical Exam Vitals and nursing note reviewed.  Constitutional:      General: She is awake. She is not in acute distress.    Appearance: She is well-developed and well-groomed. She is obese. She is not ill-appearing or toxic-appearing.  HENT:     Head: Normocephalic.     Right Ear: Hearing and external ear normal.     Left Ear: Hearing and external ear normal.  Eyes:     General: Lids are normal.        Right eye: No discharge.        Left eye: No discharge.     Conjunctiva/sclera: Conjunctivae normal.     Pupils: Pupils are equal, round, and reactive to light.  Neck:     Thyroid : No thyromegaly.     Vascular: No carotid bruit.  Cardiovascular:     Rate and Rhythm: Normal rate. Rhythm irregularly irregular.     Heart sounds: Normal heart sounds. No murmur heard.    No gallop.  Pulmonary:     Effort: Pulmonary effort is normal. No accessory muscle usage or respiratory distress.     Breath sounds: Normal breath sounds.  Abdominal:     General: Bowel sounds are normal. There is no distension.     Palpations: Abdomen is soft.     Tenderness: There is no abdominal tenderness.  Musculoskeletal:     Cervical back: Normal range of motion and neck supple.     Right lower leg: No edema.     Left lower leg: No edema.  Lymphadenopathy:     Cervical: No cervical adenopathy.  Skin:    General: Skin is warm and dry.  Neurological:     Mental Status: She is alert and oriented to person, place, and time.     Deep Tendon Reflexes: Reflexes are normal and symmetric.     Reflex Scores:      Brachioradialis reflexes are 2+ on the right side and 2+ on the left side.      Patellar reflexes are 2+ on the right side and 2+ on the left side. Psychiatric:        Attention and Perception: Attention normal.        Mood and Affect: Mood normal.        Speech: Speech normal.         Behavior: Behavior normal. Behavior is cooperative.        Thought Content: Thought content normal.    Results for orders placed or performed in visit on 06/07/22  Comp Met (CMET)   Collection Time: 06/07/22  1:50 PM  Result Value Ref Range   Glucose 82 70 - 99 mg/dL   BUN  11 8 - 27 mg/dL   Creatinine, Ser 9.01 0.57 - 1.00 mg/dL   eGFR 64 >40 fO/fpw/8.26   BUN/Creatinine Ratio 11 (L) 12 - 28   Sodium 142 134 - 144 mmol/L   Potassium 4.2 3.5 - 5.2 mmol/L   Chloride 102 96 - 106 mmol/L   CO2 23 20 - 29 mmol/L   Calcium 9.8 8.7 - 10.3 mg/dL   Total Protein 7.3 6.0 - 8.5 g/dL   Albumin  4.3 3.9 - 4.9 g/dL   Globulin, Total 3.0 1.5 - 4.5 g/dL   Albumin /Globulin Ratio 1.4 1.2 - 2.2   Bilirubin Total 0.4 0.0 - 1.2 mg/dL   Alkaline Phosphatase 118 44 - 121 IU/L   AST 17 0 - 40 IU/L   ALT 13 0 - 32 IU/L  CBC w/Diff   Collection Time: 06/07/22  1:50 PM  Result Value Ref Range   WBC 9.2 3.4 - 10.8 x10E3/uL   RBC 5.47 (H) 3.77 - 5.28 x10E6/uL   Hemoglobin 14.2 11.1 - 15.9 g/dL   Hematocrit 55.2 65.9 - 46.6 %   MCV 82 79 - 97 fL   MCH 26.0 (L) 26.6 - 33.0 pg   MCHC 31.8 31.5 - 35.7 g/dL   RDW 86.9 88.2 - 84.5 %   Platelets 281 150 - 450 x10E3/uL   Neutrophils 53 Not Estab. %   Lymphs 37 Not Estab. %   Monocytes 7 Not Estab. %   Eos 2 Not Estab. %   Basos 1 Not Estab. %   Neutrophils Absolute 4.9 1.4 - 7.0 x10E3/uL   Lymphocytes Absolute 3.4 (H) 0.7 - 3.1 x10E3/uL   Monocytes Absolute 0.6 0.1 - 0.9 x10E3/uL   EOS (ABSOLUTE) 0.2 0.0 - 0.4 x10E3/uL   Basophils Absolute 0.1 0.0 - 0.2 x10E3/uL   Immature Granulocytes 0 Not Estab. %   Immature Grans (Abs) 0.0 0.0 - 0.1 x10E3/uL  Lipid Profile   Collection Time: 06/07/22  1:50 PM  Result Value Ref Range   Cholesterol, Total 189 100 - 199 mg/dL   Triglycerides 72 0 - 149 mg/dL   HDL 44 >60 mg/dL   VLDL Cholesterol Cal 13 5 - 40 mg/dL   LDL Chol Calc (NIH) 867 (H) 0 - 99 mg/dL   Chol/HDL Ratio 4.3 0.0 - 4.4 ratio  Vitamin D  (25  hydroxy)   Collection Time: 06/07/22  1:50 PM  Result Value Ref Range   Vit D, 25-Hydroxy 33.8 30.0 - 100.0 ng/mL      Assessment & Plan:   Problem List Items Addressed This Visit       Cardiovascular and Mediastinum   Atrial fibrillation with RVR (HCC) - Primary   Chronic, stable.   No DOAC or ASA due to past GI bleed.  Continue to collaborate with cardiology, appreciate their input, she is to return as needed based on last note, however may benefit annual visits.  Labs today: CBC, Lipid, and CMP.        Relevant Medications   amLODipine  (NORVASC ) 10 MG tablet   lisinopril  (ZESTRIL ) 40 MG tablet   Hypertension   Chronic, ongoing.  BP at goal in office today.  Recommend she monitor BP at least a few mornings a week at home and document.  DASH diet at home.  Continue current medication regimen and adjust as needed.  Labs today: CBC, CMP, Lipid, urine ALB.  Urine ALB 150 January 2025, continue ACE.  Return in 6 months.       Relevant  Medications   amLODipine  (NORVASC ) 10 MG tablet   lisinopril  (ZESTRIL ) 40 MG tablet   Other Relevant Orders   Microalbumin, Urine Waived   CBC with Differential/Platelet   Comprehensive metabolic panel     Endocrine   Hypothyroidism   Chronic, stable.  Followed by endocrinology.  Continue this collaboration, recent note and labs reviewed.      Relevant Medications   levothyroxine  (SYNTHROID ) 75 MCG tablet   Multinodular goiter   Continue collaboration with endocrinology, Dr. Cherilyn.      Relevant Medications   levothyroxine  (SYNTHROID ) 75 MCG tablet   Primary hyperparathyroidism (HCC)   Chronic, stable.  Followed by endocrinology, recent note and labs reviewed.  Continue this collaboration Check CMP and PTH today.      Relevant Orders   Comprehensive metabolic panel   Parathyroid  hormone, intact (no Ca)     Other   Hyperlipidemia   Chronic, ongoing.  No current statin.  Discussed with her she may benefit medication therapy, which  she would prefer not to do.  Recheck labs today.      Relevant Medications   amLODipine  (NORVASC ) 10 MG tablet   lisinopril  (ZESTRIL ) 40 MG tablet   Other Relevant Orders   Comprehensive metabolic panel   Lipid Panel w/o Chol/HDL Ratio   Morbid obesity (HCC)   BMI 38.01, with HTN, A-FIB, HLD.  Recommended eating smaller high protein, low fat meals more frequently and exercising 30 mins a day 5 times a week with a goal of 10-15lb weight loss in the next 3 months. Patient voiced their understanding and motivation to adhere to these recommendations.       Vitamin D  deficiency   Chronic.  Recheck Vit D level today and continue current supplement.  Continue collaboration with endocrinology, recent notes and labs reviewed.      Relevant Orders   VITAMIN D  25 Hydroxy (Vit-D Deficiency, Fractures)   Other Visit Diagnoses       Postmenopausal estrogen deficiency       DEXA ordered and instructed on how to schedule.   Relevant Orders   DG Bone Density     Flu vaccine need       Flu vaccine today, educated on this.   Relevant Orders   Flu Vaccine Trivalent High Dose (Fluad) (Completed)     Encounter for screening mammogram for malignant neoplasm of breast       Mammogram ordered and instructed on how to schedule.   Relevant Orders   MM 3D SCREENING MAMMOGRAM BILATERAL BREAST        Follow up plan: Return in about 6 months (around 05/16/2024) for HTN/HLD, HYPOTHYROID.

## 2023-11-17 NOTE — Assessment & Plan Note (Signed)
 Chronic.  Recheck Vit D level today and continue current supplement.  Continue collaboration with endocrinology, recent notes and labs reviewed.

## 2023-11-17 NOTE — Assessment & Plan Note (Signed)
Continue collaboration with endocrinology, Dr. Honor Junes.

## 2023-11-18 LAB — COMPREHENSIVE METABOLIC PANEL
ALT: 12 [IU]/L (ref 0–32)
AST: 16 [IU]/L (ref 0–40)
Albumin: 4.3 g/dL (ref 3.9–4.9)
Alkaline Phosphatase: 121 [IU]/L (ref 44–121)
BUN/Creatinine Ratio: 15 (ref 12–28)
BUN: 14 mg/dL (ref 8–27)
Bilirubin Total: 0.4 mg/dL (ref 0.0–1.2)
CO2: 20 mmol/L (ref 20–29)
Calcium: 9.8 mg/dL (ref 8.7–10.3)
Chloride: 103 mmol/L (ref 96–106)
Creatinine, Ser: 0.92 mg/dL (ref 0.57–1.00)
Globulin, Total: 3.4 g/dL (ref 1.5–4.5)
Glucose: 91 mg/dL (ref 70–99)
Potassium: 3.6 mmol/L (ref 3.5–5.2)
Sodium: 141 mmol/L (ref 134–144)
Total Protein: 7.7 g/dL (ref 6.0–8.5)
eGFR: 68 mL/min/{1.73_m2} (ref >59)

## 2023-11-18 LAB — CBC WITH DIFFERENTIAL/PLATELET
Basophils Absolute: 0.1 10*3/uL (ref 0.0–0.2)
Basos: 1 %
EOS (ABSOLUTE): 0.1 10*3/uL (ref 0.0–0.4)
Eos: 2 %
Hematocrit: 48.4 % — ABNORMAL HIGH (ref 34.0–46.6)
Hemoglobin: 15.5 g/dL (ref 11.1–15.9)
Immature Grans (Abs): 0 10*3/uL (ref 0.0–0.1)
Immature Granulocytes: 0 %
Lymphocytes Absolute: 3.3 10*3/uL — ABNORMAL HIGH (ref 0.7–3.1)
Lymphs: 36 %
MCH: 27.2 pg (ref 26.6–33.0)
MCHC: 32 g/dL (ref 31.5–35.7)
MCV: 85 fL (ref 79–97)
Monocytes Absolute: 0.6 10*3/uL (ref 0.1–0.9)
Monocytes: 6 %
Neutrophils Absolute: 5.1 10*3/uL (ref 1.4–7.0)
Neutrophils: 55 %
Platelets: 259 10*3/uL (ref 150–450)
RBC: 5.7 x10E6/uL — ABNORMAL HIGH (ref 3.77–5.28)
RDW: 12.8 % (ref 11.7–15.4)
WBC: 9.2 10*3/uL (ref 3.4–10.8)

## 2023-11-18 LAB — VITAMIN D 25 HYDROXY (VIT D DEFICIENCY, FRACTURES): Vit D, 25-Hydroxy: 28.2 ng/mL — ABNORMAL LOW (ref 30.0–100.0)

## 2023-11-18 LAB — LIPID PANEL W/O CHOL/HDL RATIO
Cholesterol, Total: 214 mg/dL — ABNORMAL HIGH (ref 100–199)
HDL: 47 mg/dL (ref >39)
LDL Chol Calc (NIH): 149 mg/dL — ABNORMAL HIGH (ref 0–99)
Triglycerides: 99 mg/dL (ref 0–149)
VLDL Cholesterol Cal: 18 mg/dL (ref 5–40)

## 2023-11-18 LAB — PARATHYROID HORMONE, INTACT (NO CA): PTH: 47 pg/mL (ref 15–65)

## 2023-11-18 NOTE — Progress Notes (Signed)
 Good morning, please let Melynda know labs have returned: - CBC shows no anemia or infection.  Very mild elevation in lymphocytes, a type of white blood cell, which we can monitor. - Lipid panel shows ongoing elevation in LDL, bad cholesterol, and total cholesterol.  This places you at higher risk for stroke or heart event.  I do recommend starting a low dose statin daily to help lower levels.  Are you okay with trying this?   - Vitamin D  is a little low, ensure you are taking Vitamin D3 2000 units daily. - Remainder of labs stable.  Any questions? Keep being amazing!!  Thank you for allowing me to participate in your care.  I appreciate you. Kindest regards, Dori Devino

## 2024-05-31 NOTE — Patient Instructions (Incomplete)
 Be Involved in Caring For Your Health:  Taking Medications When medications are taken as directed, they can greatly improve your health. But if they are not taken as prescribed, they may not work. In some cases, not taking them correctly can be harmful. To help ensure your treatment remains effective and safe, understand your medications and how to take them. Bring your medications to each visit for review by your provider.  Your lab results, notes, and after visit summary will be available on My Chart. We strongly encourage you to use this feature. If lab results are abnormal the clinic will contact you with the appropriate steps. If the clinic does not contact you assume the results are satisfactory. You can always view your results on My Chart. If you have questions regarding your health or results, please contact the clinic during office hours. You can also ask questions on My Chart.  We at [facility] are grateful that you chose [place] to provide your care. We strive to provide evidence-based and compassionate care and are always looking for feedback. If you get a survey from the clinic please complete this so we can hear your opinions.  Heart-Healthy Eating Plan Many factors influence your heart health, including eating and exercise habits. Heart health is also called coronary health. Coronary risk increases with abnormal blood fat (lipid) levels. A heart-healthy eating plan includes limiting unhealthy fats, increasing healthy fats, limiting salt (sodium) intake, and making other diet and lifestyle changes. What is my plan? Your health care provider may recommend that: You limit your fat intake to _________% or less of your total calories each day. You limit your saturated fat intake to _________% or less of your total calories each day. You limit the amount of cholesterol in your diet to less than _________ mg per day. You limit the amount of sodium in your diet to less than _________  mg per day. What are tips for following this plan? Cooking Cook foods using methods other than frying. Baking, boiling, grilling, and broiling are all good options. Other ways to reduce fat include: Removing the skin from poultry. Removing all visible fats from meats. Steaming vegetables in water or broth. Meal planning  At meals, imagine dividing your plate into fourths: Fill one-half of your plate with vegetables and green salads. Fill one-fourth of your plate with whole grains. Fill one-fourth of your plate with lean protein foods. Eat 2-4 cups of vegetables per day. One cup of vegetables equals 1 cup (91 g) broccoli or cauliflower florets, 2 medium carrots, 1 large bell pepper, 1 large sweet potato, 1 large tomato, 1 medium white potato, 2 cups (150 g) raw leafy greens. Eat 1-2 cups of fruit per day. One cup of fruit equals 1 small apple, 1 large banana, 1 cup (237 g) mixed fruit, 1 large orange,  cup (82 g) dried fruit, 1 cup (240 mL) 100% fruit juice. Eat more foods that contain soluble fiber. Examples include apples, broccoli, carrots, beans, peas, and barley. Aim to get 25-30 g of fiber per day. Increase your consumption of legumes, nuts, and seeds to 4-5 servings per week. One serving of dried beans or legumes equals  cup (90 g) cooked, 1 serving of nuts is  oz (12 almonds, 24 pistachios, or 7 walnut halves), and 1 serving of seeds equals  oz (8 g). Fats Choose healthy fats more often. Choose monounsaturated and polyunsaturated fats, such as olive and canola oils, avocado oil, flaxseeds, walnuts, almonds, and seeds. Eat  more omega-3 fats. Choose salmon, mackerel, sardines, tuna, flaxseed oil, and ground flaxseeds. Aim to eat fish at least 2 times each week. Check food labels carefully to identify foods with trans fats or high amounts of saturated fat. Limit saturated fats. These are found in animal products, such as meats, butter, and cream. Plant sources of saturated fats  include palm oil, palm kernel oil, and coconut oil. Avoid foods with partially hydrogenated oils in them. These contain trans fats. Examples are stick margarine, some tub margarines, cookies, crackers, and other baked goods. Avoid fried foods. General information Eat more home-cooked food and less restaurant, buffet, and fast food. Limit or avoid alcohol. Limit foods that are high in added sugar and simple starches such as foods made using white refined flour (white breads, pastries, sweets). Lose weight if you are overweight. Losing just 5-10% of your body weight can help your overall health and prevent diseases such as diabetes and heart disease. Monitor your sodium intake, especially if you have high blood pressure. Talk with your health care provider about your sodium intake. Try to incorporate more vegetarian meals weekly. What foods should I eat? Fruits All fresh, canned (in natural juice), or frozen fruits. Vegetables Fresh or frozen vegetables (raw, steamed, roasted, or grilled). Green salads. Grains Most grains. Choose whole wheat and whole grains most of the time. Rice and pasta, including brown rice and pastas made with whole wheat. Meats and other proteins Lean, well-trimmed beef, veal, pork, and lamb. Chicken and [place] without skin. All fish and shellfish. Wild duck, rabbit, pheasant, and venison. Egg whites or low-cholesterol egg substitutes. Dried beans, peas, lentils, and tofu. Seeds and most nuts. Dairy Low-fat or nonfat cheeses, including ricotta and mozzarella. Skim or 1% milk (liquid, powdered, or evaporated). Buttermilk made with low-fat milk. Nonfat or low-fat yogurt. Fats and oils Non-hydrogenated (trans-free) margarines. Vegetable oils, including soybean, sesame, sunflower, olive, avocado, peanut, safflower, corn, canola, and cottonseed. Salad dressings or mayonnaise made with a vegetable oil. Beverages Water (mineral or sparkling). Coffee and tea. Unsweetened ice  tea. Diet beverages. Sweets and desserts Sherbet, gelatin, and fruit ice. Small amounts of dark chocolate. Limit all sweets and desserts. Seasonings and condiments All seasonings and condiments. The items listed above may not be a complete list of foods and beverages you can eat. Contact a dietitian for more options. What foods should I avoid? Fruits Canned fruit in heavy syrup. Fruit in cream or butter sauce. Fried fruit. Limit coconut. Vegetables Vegetables cooked in cheese, cream, or butter sauce. Fried vegetables. Grains Breads made with saturated or trans fats, oils, or whole milk. Croissants. Sweet rolls. Donuts. High-fat crackers, such as cheese crackers and chips. Meats and other proteins Fatty meats, such as hot dogs, ribs, sausage, bacon, rib-eye roast or steak. High-fat deli meats, such as salami and bologna. Caviar. Domestic duck and goose. Organ meats, such as liver. Dairy Cream, sour cream, cream cheese, and creamed cottage cheese. Whole-milk cheeses. Whole or 2% milk (liquid, evaporated, or condensed). Whole buttermilk. Cream sauce or high-fat cheese sauce. Whole-milk yogurt. Fats and oils Meat fat, or shortening. Cocoa butter, hydrogenated oils, palm oil, coconut oil, palm kernel oil. Solid fats and shortenings, including bacon fat, salt pork, lard, and butter. Nondairy cream substitutes. Salad dressings with cheese or sour cream. Beverages Regular sodas and any drinks with added sugar. Sweets and desserts Frosting. Pudding. Cookies. Cakes. Pies. Milk chocolate or white chocolate. Buttered syrups. Full-fat ice cream or ice cream drinks. The items listed above may  not be a complete list of foods and beverages to avoid. Contact a dietitian for more information. Summary Heart-healthy meal planning includes limiting unhealthy fats, increasing healthy fats, limiting salt (sodium) intake and making other diet and lifestyle changes. Lose weight if you are overweight. Losing just  5-10% of your body weight can help your overall health and prevent diseases such as diabetes and heart disease. Focus on eating a balance of foods, including fruits and vegetables, low-fat or nonfat dairy, lean protein, nuts and legumes, whole grains, and heart-healthy oils and fats. This information is not intended to replace advice given to you by your health care provider. Make sure you discuss any questions you have with your health care provider. Document Revised: 11/26/2021 Document Reviewed: 11/26/2021 Elsevier Patient Education  2024 [employer].

## 2024-06-04 ENCOUNTER — Ambulatory Visit: Payer: Self-pay | Admitting: Nurse Practitioner

## 2024-06-04 DIAGNOSIS — E210 Primary hyperparathyroidism: Secondary | ICD-10-CM

## 2024-06-04 DIAGNOSIS — E039 Hypothyroidism, unspecified: Secondary | ICD-10-CM

## 2024-06-04 DIAGNOSIS — E782 Mixed hyperlipidemia: Secondary | ICD-10-CM

## 2024-06-04 DIAGNOSIS — I10 Essential (primary) hypertension: Secondary | ICD-10-CM

## 2024-06-04 DIAGNOSIS — I4891 Unspecified atrial fibrillation: Secondary | ICD-10-CM

## 2024-06-15 DIAGNOSIS — E042 Nontoxic multinodular goiter: Secondary | ICD-10-CM | POA: Diagnosis not present

## 2024-07-18 NOTE — Patient Instructions (Addendum)
 Omega 3 supplement  Please call to schedule your mammogram and/or bone density: [facility] at [facility]  Address: [address], [place], [place] [address] Phone: [phone]  [facility] [address] [place],  [place]  [address] Phone: [phone]    Heart-Healthy Eating Plan Many factors influence your heart health, including eating and exercise habits. Heart health is also called coronary health. Coronary risk increases with abnormal blood fat (lipid) levels. A heart-healthy eating plan includes limiting unhealthy fats, increasing healthy fats, limiting salt (sodium) intake, and making other diet and lifestyle changes. What is my plan? Your health care provider may recommend that: You limit your fat intake to _________% or less of your total calories each day. You limit your saturated fat intake to _________% or less of your total calories each day. You limit the amount of cholesterol in your diet to less than _________ mg per day. You limit the amount of sodium in your diet to less than _________ mg per day. What are tips for following this plan? Cooking Cook foods using methods other than frying. Baking, boiling, grilling, and broiling are all good options. Other ways to reduce fat include: Removing the skin from poultry. Removing all visible fats from meats. Steaming vegetables in water  or broth. Meal planning  At meals, imagine dividing your plate into fourths: Fill one-half of your plate with vegetables and green salads. Fill one-fourth of your plate with whole grains. Fill one-fourth of your plate with lean protein foods. Eat 2-4 cups of vegetables per day. One cup of vegetables equals 1 cup (91 g) broccoli or cauliflower florets, 2 medium carrots, 1 large bell pepper, 1 large sweet potato, 1 large tomato, 1 medium white potato, 2 cups (150 g) raw leafy greens. Eat 1-2 cups of fruit per day. One cup of  fruit equals 1 small apple, 1 large banana, 1 cup (237 g) mixed fruit, 1 large orange,  cup (82 g) dried fruit, 1 cup (240 mL) 100% fruit juice. Eat more foods that contain soluble fiber. Examples include apples, broccoli, carrots, beans, peas, and barley. Aim to get 25-30 g of fiber per day. Increase your consumption of legumes, nuts, and seeds to 4-5 servings per week. One serving of dried beans or legumes equals  cup (90 g) cooked, 1 serving of nuts is  oz (12 almonds, 24 pistachios, or 7 walnut halves), and 1 serving of seeds equals  oz (8 g). Fats Choose healthy fats more often. Choose monounsaturated and polyunsaturated fats, such as olive and canola oils, avocado oil, flaxseeds, walnuts, almonds, and seeds. Eat more omega-3 fats. Choose salmon, mackerel, sardines, tuna, flaxseed oil, and ground flaxseeds. Aim to eat fish at least 2 times each week. Check food labels carefully to identify foods with trans fats or high amounts of saturated fat. Limit saturated fats. These are found in animal products, such as meats, butter, and cream. Plant sources of saturated fats include palm oil, palm kernel oil, and coconut oil. Avoid foods with partially hydrogenated oils in them. These contain trans fats. Examples are stick margarine, some tub margarines, cookies, crackers, and other baked goods. Avoid fried foods. General information Eat more home-cooked food and less restaurant, buffet, and fast food. Limit or avoid alcohol. Limit foods that are high in added sugar and simple starches such as foods made using white refined flour (white breads, pastries, sweets). Lose weight if you are overweight. Losing just 5-10% of your  body weight can help your overall health and prevent diseases such as diabetes and heart disease. Monitor your sodium intake, especially if you have high blood pressure. Talk with your health care provider about your sodium intake. Try to incorporate more vegetarian meals  weekly. What foods should I eat? Fruits All fresh, canned (in natural juice), or frozen fruits. Vegetables Fresh or frozen vegetables (raw, steamed, roasted, or grilled). Green salads. Grains Most grains. Choose whole wheat and whole grains most of the time. Rice and pasta, including brown rice and pastas made with whole wheat. Meats and other proteins Lean, well-trimmed beef, veal, pork, and lamb. Chicken and [place] without skin. All fish and shellfish. Wild duck, rabbit, pheasant, and venison. Egg whites or low-cholesterol egg substitutes. Dried beans, peas, lentils, and tofu. Seeds and most nuts. Dairy Low-fat or nonfat cheeses, including ricotta and mozzarella. Skim or 1% milk (liquid, powdered, or evaporated). Buttermilk made with low-fat milk. Nonfat or low-fat yogurt. Fats and oils Non-hydrogenated (trans-free) margarines. Vegetable oils, including soybean, sesame, sunflower, olive, avocado, peanut, safflower, corn, canola, and cottonseed. Salad dressings or mayonnaise made with a vegetable oil. Beverages Water  (mineral or sparkling). Coffee and tea. Unsweetened ice tea. Diet beverages. Sweets and desserts Sherbet, gelatin, and fruit ice. Small amounts of dark chocolate. Limit all sweets and desserts. Seasonings and condiments All seasonings and condiments. The items listed above may not be a complete list of foods and beverages you can eat. Contact a dietitian for more options. What foods should I avoid? Fruits Canned fruit in heavy syrup. Fruit in cream or butter sauce. Fried fruit. Limit coconut. Vegetables Vegetables cooked in cheese, cream, or butter sauce. Fried vegetables. Grains Breads made with saturated or trans fats, oils, or whole milk. Croissants. Sweet rolls. Donuts. High-fat crackers, such as cheese crackers and chips. Meats and other proteins Fatty meats, such as hot dogs, ribs, sausage, bacon, rib-eye roast or steak. High-fat deli meats, such as salami and  bologna. Caviar. Domestic duck and goose. Organ meats, such as liver. Dairy Cream, sour cream, cream cheese, and creamed cottage cheese. Whole-milk cheeses. Whole or 2% milk (liquid, evaporated, or condensed). Whole buttermilk. Cream sauce or high-fat cheese sauce. Whole-milk yogurt. Fats and oils Meat fat, or shortening. Cocoa butter, hydrogenated oils, palm oil, coconut oil, palm kernel oil. Solid fats and shortenings, including bacon fat, salt pork, lard, and butter. Nondairy cream substitutes. Salad dressings with cheese or sour cream. Beverages Regular sodas and any drinks with added sugar. Sweets and desserts Frosting. Pudding. Cookies. Cakes. Pies. Milk chocolate or white chocolate. Buttered syrups. Full-fat ice cream or ice cream drinks. The items listed above may not be a complete list of foods and beverages to avoid. Contact a dietitian for more information. Summary Heart-healthy meal planning includes limiting unhealthy fats, increasing healthy fats, limiting salt (sodium) intake and making other diet and lifestyle changes. Lose weight if you are overweight. Losing just 5-10% of your body weight can help your overall health and prevent diseases such as diabetes and heart disease. Focus on eating a balance of foods, including fruits and vegetables, low-fat or nonfat dairy, lean protein, nuts and legumes, whole grains, and heart-healthy oils and fats. This information is not intended to replace advice given to you by your health care provider. Make sure you discuss any questions you have with your health care provider. Document Revised: 11/26/2021 Document Reviewed: 11/26/2021 Elsevier Patient Education  2024 [employer].

## 2024-07-23 ENCOUNTER — Encounter: Payer: Self-pay | Admitting: Nurse Practitioner

## 2024-07-23 ENCOUNTER — Ambulatory Visit (INDEPENDENT_AMBULATORY_CARE_PROVIDER_SITE_OTHER): Admitting: Nurse Practitioner

## 2024-07-23 VITALS — BP 129/77 | HR 60 | Ht 65.0 in | Wt 215.0 lb

## 2024-07-23 DIAGNOSIS — E039 Hypothyroidism, unspecified: Secondary | ICD-10-CM

## 2024-07-23 DIAGNOSIS — Z780 Asymptomatic menopausal state: Secondary | ICD-10-CM | POA: Diagnosis not present

## 2024-07-23 DIAGNOSIS — E782 Mixed hyperlipidemia: Secondary | ICD-10-CM

## 2024-07-23 DIAGNOSIS — Z23 Encounter for immunization: Secondary | ICD-10-CM | POA: Diagnosis not present

## 2024-07-23 DIAGNOSIS — E210 Primary hyperparathyroidism: Secondary | ICD-10-CM | POA: Diagnosis not present

## 2024-07-23 DIAGNOSIS — Z1231 Encounter for screening mammogram for malignant neoplasm of breast: Secondary | ICD-10-CM | POA: Diagnosis not present

## 2024-07-23 DIAGNOSIS — I10 Essential (primary) hypertension: Secondary | ICD-10-CM

## 2024-07-23 DIAGNOSIS — I4891 Unspecified atrial fibrillation: Secondary | ICD-10-CM | POA: Diagnosis not present

## 2024-07-23 DIAGNOSIS — E042 Nontoxic multinodular goiter: Secondary | ICD-10-CM

## 2024-07-23 MED ORDER — LISINOPRIL 40 MG PO TABS: 40.0000 mg | ORAL_TABLET | Freq: Every day | ORAL | 4 refills | Status: DC

## 2024-07-23 MED ORDER — LEVOTHYROXINE SODIUM 75 MCG PO TABS: 75.0000 ug | ORAL_TABLET | Freq: Every day | ORAL | 4 refills | Status: AC

## 2024-07-23 MED ORDER — AMLODIPINE BESYLATE 10 MG PO TABS: 10.0000 mg | ORAL_TABLET | Freq: Every morning | ORAL | 4 refills | Status: AC

## 2024-07-23 NOTE — Assessment & Plan Note (Signed)
 BMI 35.78, with HTN, A-FIB, HLD.  Praised for 13 lbs weight loss.  Recommended eating smaller high protein, low fat meals more frequently and exercising 30 mins a day 5 times a week with a goal of 10-15lb weight loss in the next 3 months. Patient voiced their understanding and motivation to adhere to these recommendations.

## 2024-07-23 NOTE — Progress Notes (Signed)
 BP 129/77 (BP Location: Left Arm, Patient Position: Sitting, Cuff Size: Large)   Pulse 60   Ht 5' 5 (1.651 m)   Wt 215 lb (97.5 kg)   SpO2 97%   BMI 35.78 kg/m    Subjective:    Patient ID: Courtney Anderson, female    DOB: [birth year], 68 y.o.   MRN: [id number]  HPI: Courtney Anderson is a 68 y.o. female  Chief Complaint  Patient presents with   Hypothyroidism   Hypertension   Hyperlipidemia   HYPERTENSION / HYPERLIPIDEMIA Taking Amlodipine  and Lisinopril , no statin therapy. Satisfied with current treatment? yes Duration of hypertension: chronic BP monitoring frequency: daily BP range: 120/80 range on average BP medication side effects: no Duration of hyperlipidemia: chronic Cholesterol supplements: none Aspirin : no Recent stressors: no Recurrent headaches: no Visual changes: no Palpitations: no Dyspnea: no Chest pain: no Lower extremity edema: no Dizzy/lightheaded: no  The 10-year ASCVD risk score (Arnett DK, et al., 2019) is: 11.7%   Values used to calculate the score:     Age: 68 years     Clincally relevant sex: Female     Is Non-Hispanic African American: Yes     Diabetic: No     Tobacco smoker: No     Systolic Blood Pressure: 129 mmHg     Is BP treated: Yes     HDL Cholesterol: 47 mg/dL     Total Cholesterol: 214 mg/dL  ATRIAL FIBRILLATION Diagnosed with this due to Covid, 10/27/20. Had a GI blood with blood thinners in past. Was released by cardiology on 10/11/22, to return as needed only Atrial fibrillation status: stable Satisfied with current treatment: yes  Medication side effects:  no Medication compliance: good compliance Etiology of atrial fibrillation: Covid Palpitations:  no Chest pain:  no Dyspnea on exertion:  no Orthopnea:  no Syncope:  no Edema:  no Ventricular rate control: Not indicated Anti-coagulation: can not take due to past GI bleed  HYPOTHYROIDISM Continues Levothyroxine  75 MCG daily.  Last saw Dr. Cherilyn on 05/30/23, was  to see this August but she had not had ultrasound yet. Ultrasound performed 06/15/24, repeat in one year. Thyroid  control status:stable Satisfied with current treatment? yes Medication side effects: no Medication compliance: good compliance Etiology of hypothyroidism:  Recent dose adjustment:no Fatigue: no Cold intolerance: no Heat intolerance: no Weight gain: no Weight loss: no Constipation: no Diarrhea/loose stools: no Palpitations: no Lower extremity edema: no Anxiety/depressed mood: no     11/17/2023   11:19 AM 11/06/2023    4:04 PM 08/08/2022    8:43 AM 08/06/2021    8:21 AM 03/23/2021    9:14 AM  Depression screen PHQ 2/9  Decreased Interest 0 0 0 0 0  Down, Depressed, Hopeless 0 0 0 0 0  PHQ - 2 Score 0 0 0 0 0  Altered sleeping 0  0  0  Tired, decreased energy 0  0  0  Change in appetite 0  0  0  Feeling bad or failure about yourself  0  0  0  Trouble concentrating 0  0  0  Moving slowly or fidgety/restless 0  0  0  Suicidal thoughts 0  0  0  PHQ-9 Score 0  0  0  Difficult doing work/chores Not difficult at all  Not difficult at all  Not difficult at all       11/17/2023   11:18 AM  GAD 7 : Generalized Anxiety Score  Nervous, Anxious, on  Edge 0  Control/stop worrying 0  Worry too much - different things 0  Trouble relaxing 0  Restless 0  Easily annoyed or irritable 0  Afraid - awful might happen 0  Total GAD 7 Score 0  Anxiety Difficulty Not difficult at all   Relevant past medical, surgical, family and social history reviewed and updated as indicated. Interim medical history since our last visit reviewed. Allergies and medications reviewed and updated.  Review of Systems  Constitutional:  Negative for activity change, appetite change, diaphoresis, fatigue and fever.  Respiratory:  Negative for cough, chest tightness and shortness of breath.   Cardiovascular:  Negative for chest pain, palpitations and leg swelling.  Gastrointestinal:  Negative for abdominal  distention, abdominal pain, diarrhea, nausea and vomiting.  Endocrine: Negative for cold intolerance and heat intolerance.  Neurological: Negative.   Psychiatric/Behavioral: Negative.      Per HPI unless specifically indicated above     Objective:    BP 129/77 (BP Location: Left Arm, Patient Position: Sitting, Cuff Size: Large)   Pulse 60   Ht 5' 5 (1.651 m)   Wt 215 lb (97.5 kg)   SpO2 97%   BMI 35.78 kg/m   Wt Readings from Last 3 Encounters:  07/23/24 215 lb (97.5 kg)  11/17/23 228 lb 6.4 oz (103.6 kg)  11/06/23 226 lb (102.5 kg)    Physical Exam Vitals and nursing note reviewed.  Constitutional:      General: She is awake. She is not in acute distress.    Appearance: She is well-developed and well-groomed. She is obese. She is not ill-appearing or toxic-appearing.  HENT:     Head: Normocephalic.     Right Ear: Hearing and external ear normal.     Left Ear: Hearing and external ear normal.  Eyes:     General: Lids are normal.        Right eye: No discharge.        Left eye: No discharge.     Conjunctiva/sclera: Conjunctivae normal.     Pupils: Pupils are equal, round, and reactive to light.  Neck:     Thyroid : No thyromegaly.     Vascular: No carotid bruit.  Cardiovascular:     Rate and Rhythm: Normal rate and regular rhythm.     Heart sounds: Normal heart sounds. No murmur heard.    No gallop.  Pulmonary:     Effort: Pulmonary effort is normal. No accessory muscle usage or respiratory distress.     Breath sounds: Normal breath sounds.  Abdominal:     General: Bowel sounds are normal. There is no distension.     Palpations: Abdomen is soft.     Tenderness: There is no abdominal tenderness.  Musculoskeletal:     Cervical back: Normal range of motion and neck supple.     Right lower leg: No edema.     Left lower leg: No edema.  Lymphadenopathy:     Cervical: No cervical adenopathy.  Skin:    General: Skin is warm and dry.  Neurological:     Mental  Status: She is alert and oriented to person, place, and time.     Deep Tendon Reflexes: Reflexes are normal and symmetric.     Reflex Scores:      Brachioradialis reflexes are 2+ on the right side and 2+ on the left side.      Patellar reflexes are 2+ on the right side and 2+ on the left side. Psychiatric:  Attention and Perception: Attention normal.        Mood and Affect: Mood normal.        Speech: Speech normal.        Behavior: Behavior normal. Behavior is cooperative.        Thought Content: Thought content normal.    Results for orders placed or performed in visit on 11/17/23  Microalbumin, Urine Waived   Collection Time: 11/17/23 11:24 AM  Result Value Ref Range   Microalb, Ur Waived 150 (H) 0 - 19 mg/L   Creatinine, Urine Waived 200 10 - 300 mg/dL   Microalb/Creat Ratio 30-300 (H) <30 mg/g  CBC with Differential/Platelet   Collection Time: 11/17/23 11:26 AM  Result Value Ref Range   WBC 9.2 3.4 - 10.8 x10E3/uL   RBC 5.70 (H) 3.77 - 5.28 x10E6/uL   Hemoglobin 15.5 11.1 - 15.9 g/dL   Hematocrit 51.5 (H) 65.9 - 46.6 %   MCV 85 79 - 97 fL   MCH 27.2 26.6 - 33.0 pg   MCHC 32.0 31.5 - 35.7 g/dL   RDW 87.1 88.2 - 84.5 %   Platelets 259 150 - 450 x10E3/uL   Neutrophils 55 Not Estab. %   Lymphs 36 Not Estab. %   Monocytes 6 Not Estab. %   Eos 2 Not Estab. %   Basos 1 Not Estab. %   Neutrophils Absolute 5.1 1.4 - 7.0 x10E3/uL   Lymphocytes Absolute 3.3 (H) 0.7 - 3.1 x10E3/uL   Monocytes Absolute 0.6 0.1 - 0.9 x10E3/uL   EOS (ABSOLUTE) 0.1 0.0 - 0.4 x10E3/uL   Basophils Absolute 0.1 0.0 - 0.2 x10E3/uL   Immature Granulocytes 0 Not Estab. %   Immature Grans (Abs) 0.0 0.0 - 0.1 x10E3/uL  Comprehensive metabolic panel   Collection Time: 11/17/23 11:26 AM  Result Value Ref Range   Glucose 91 70 - 99 mg/dL   BUN 14 8 - 27 mg/dL   Creatinine, Ser 9.07 0.57 - 1.00 mg/dL   eGFR 68 >40 fO/fpw/8.26   BUN/Creatinine Ratio 15 12 - 28   Sodium 141 134 - 144 mmol/L    Potassium 3.6 3.5 - 5.2 mmol/L   Chloride 103 96 - 106 mmol/L   CO2 20 20 - 29 mmol/L   Calcium 9.8 8.7 - 10.3 mg/dL   Total Protein 7.7 6.0 - 8.5 g/dL   Albumin  4.3 3.9 - 4.9 g/dL   Globulin, Total 3.4 1.5 - 4.5 g/dL   Bilirubin Total 0.4 0.0 - 1.2 mg/dL   Alkaline Phosphatase 121 44 - 121 IU/L   AST 16 0 - 40 IU/L   ALT 12 0 - 32 IU/L  Lipid Panel w/o Chol/HDL Ratio   Collection Time: 11/17/23 11:26 AM  Result Value Ref Range   Cholesterol, Total 214 (H) 100 - 199 mg/dL   Triglycerides 99 0 - 149 mg/dL   HDL 47 >60 mg/dL   VLDL Cholesterol Cal 18 5 - 40 mg/dL   LDL Chol Calc (NIH) 850 (H) 0 - 99 mg/dL  VITAMIN D  25 Hydroxy (Vit-D Deficiency, Fractures)   Collection Time: 11/17/23 11:26 AM  Result Value Ref Range   Vit D, 25-Hydroxy 28.2 (L) 30.0 - 100.0 ng/mL  Parathyroid  hormone, intact (no Ca)   Collection Time: 11/17/23 11:26 AM  Result Value Ref Range   PTH 47 15 - 65 pg/mL      Assessment & Plan:   Problem List Items Addressed This Visit       Cardiovascular and  Mediastinum   Hypertension   Chronic, ongoing.  BP at goal in office today.  Recommend she monitor BP at least a few mornings a week at home and document.  DASH diet at home.  Continue current medication regimen and adjust as needed.  Labs today: CMP.  Urine ALB 150 January 2025, continue ACE.  Return in 6 months.       Relevant Medications   amLODipine  (NORVASC ) 10 MG tablet   lisinopril  (ZESTRIL ) 40 MG tablet   Atrial fibrillation with RVR (HCC) - Primary   Chronic, stable.   No DOAC or ASA due to past GI bleed.  Continue to collaborate with cardiology, appreciate their input, she is to return as needed based on last note, however may benefit annual visits.  Labs today: CMP      Relevant Medications   amLODipine  (NORVASC ) 10 MG tablet   lisinopril  (ZESTRIL ) 40 MG tablet     Endocrine   Primary hyperparathyroidism (HCC)   Chronic, stable.  Followed by endocrinology, recent note and labs reviewed.   Continue this collaboration Check CMP today.      Hypothyroidism   Chronic, stable.  Followed by endocrinology.  Continue this collaboration, recent note and labs reviewed. Will check labs today as missed recent endo visit.      Relevant Medications   levothyroxine  (SYNTHROID ) 75 MCG tablet   Other Relevant Orders   TSH   T4, free     Other   Morbid obesity (HCC)   BMI 35.78, with HTN, A-FIB, HLD.  Praised for 13 lbs weight loss.  Recommended eating smaller high protein, low fat meals more frequently and exercising 30 mins a day 5 times a week with a goal of 10-15lb weight loss in the next 3 months. Patient voiced their understanding and motivation to adhere to these recommendations.       Hyperlipidemia   Chronic, ongoing.  No current statin.  Discussed with her she would benefit medication therapy, which sshe continues to refuse.  Recheck labs today. Focus heavily on diet and exercise.      Relevant Medications   amLODipine  (NORVASC ) 10 MG tablet   lisinopril  (ZESTRIL ) 40 MG tablet   Other Relevant Orders   Comprehensive metabolic panel with GFR   Lipid Panel w/o Chol/HDL Ratio   Other Visit Diagnoses       Postmenopausal estrogen deficiency       DEXA ordered and instructed how to schedule.   Relevant Orders   DG Bone Density     Encounter for screening mammogram for malignant neoplasm of breast       Mammogram ordered and instructed how to schedule.   Relevant Orders   MM 3D SCREENING MAMMOGRAM BILATERAL BREAST     Flu vaccine need       Relevant Orders   Flu vaccine HIGH DOSE PF(Fluzone Trivalent) (Completed)        Follow up plan: Return in about 6 months (around 01/20/2025) for Annual Physical.

## 2024-07-23 NOTE — Assessment & Plan Note (Signed)
 Chronic, ongoing.  BP at goal in office today.  Recommend she monitor BP at least a few mornings a week at home and document.  DASH diet at home.  Continue current medication regimen and adjust as needed.  Labs today: CMP.  Urine ALB 150 January 2025, continue ACE.  Return in 6 months.

## 2024-07-23 NOTE — Assessment & Plan Note (Signed)
 Chronic, ongoing.  No current statin.  Discussed with her she would benefit medication therapy, which sshe continues to refuse.  Recheck labs today. Focus heavily on diet and exercise.

## 2024-07-23 NOTE — Assessment & Plan Note (Signed)
 Chronic, stable.   No DOAC or ASA due to past GI bleed.  Continue to collaborate with cardiology, appreciate their input, she is to return as needed based on last note, however may benefit annual visits.  Labs today: CMP

## 2024-07-23 NOTE — Assessment & Plan Note (Addendum)
 Chronic, stable.  Followed by endocrinology, recent note and labs reviewed.  Continue this collaboration Check CMP today.

## 2024-07-23 NOTE — Assessment & Plan Note (Signed)
 Chronic, stable.  Followed by endocrinology.  Continue this collaboration, recent note and labs reviewed. Will check labs today as missed recent endo visit.

## 2024-07-24 ENCOUNTER — Ambulatory Visit: Payer: Self-pay | Admitting: Nurse Practitioner

## 2024-07-24 LAB — COMPREHENSIVE METABOLIC PANEL WITH GFR
ALT: 13 IU/L (ref 0–32)
AST: 20 IU/L (ref 0–40)
Albumin: 4.1 g/dL (ref 3.9–4.9)
Alkaline Phosphatase: 116 IU/L (ref 49–135)
BUN/Creatinine Ratio: 13 (ref 12–28)
BUN: 13 mg/dL (ref 8–27)
Bilirubin Total: 0.3 mg/dL (ref 0.0–1.2)
CO2: 24 mmol/L (ref 20–29)
Calcium: 9.7 mg/dL (ref 8.7–10.3)
Chloride: 101 mmol/L (ref 96–106)
Creatinine, Ser: 0.97 mg/dL (ref 0.57–1.00)
Globulin, Total: 3.1 g/dL (ref 1.5–4.5)
Glucose: 91 mg/dL (ref 70–99)
Potassium: 3.8 mmol/L (ref 3.5–5.2)
Sodium: 140 mmol/L (ref 134–144)
Total Protein: 7.2 g/dL (ref 6.0–8.5)
eGFR: 64 mL/min/1.73 (ref >59)

## 2024-07-24 LAB — LIPID PANEL W/O CHOL/HDL RATIO
Cholesterol, Total: 190 mg/dL (ref 100–199)
HDL: 50 mg/dL (ref >39)
LDL Chol Calc (NIH): 128 mg/dL — ABNORMAL HIGH (ref 0–99)
Triglycerides: 66 mg/dL (ref 0–149)
VLDL Cholesterol Cal: 12 mg/dL (ref 5–40)

## 2024-07-24 LAB — T4, FREE: Free T4: 1.3 ng/dL (ref 0.82–1.77)

## 2024-07-24 LAB — TSH: TSH: 1.07 u[IU]/mL (ref 0.450–4.500)

## 2024-07-24 NOTE — Progress Notes (Signed)
 Good morning, please let Courtney Anderson know her labs have returned: - Kidney and liver function are normal. - LDL, bad cholesterol, remains elevated and does place risk higher for stroke or heart event.  I do recommend starting statin therapy, but know you prefer not to start this.  Take Omega 3 as we discussed and focus heavily on diet. - Thyroid  levels remain stable, no medication changes needed.  Any questions? Keep being stellar!!  Thank you for allowing me to participate in your care.  I appreciate you. Kindest regards, Guillaume Weninger

## 2024-07-30 NOTE — Progress Notes (Signed)
 Courtney Anderson                                          MRN: [id number]   07/30/2024   The [facility] Quality Team Specialist reviewed this patient medical record for the purposes of chart review for care gap closure. The following were reviewed: abstraction for care gap closure-colorectal cancer screening.    [facility] Quality Team

## 2024-11-03 ENCOUNTER — Other Ambulatory Visit: Payer: Self-pay | Admitting: Nurse Practitioner

## 2024-11-03 DIAGNOSIS — I10 Essential (primary) hypertension: Secondary | ICD-10-CM

## 2024-11-03 DIAGNOSIS — E039 Hypothyroidism, unspecified: Secondary | ICD-10-CM

## 2024-11-03 NOTE — Telephone Encounter (Unsigned)
 Copied from CRM #[id number]. Topic: Clinical - Medication Refill >> Nov 03, 2024 11:32 AM Tiffini S wrote: Medication:  levothyroxine  (SYNTHROID ) 75 MCG tablet, amLODipine  (NORVASC ) 10 MG tablet. and lisinopril  (ZESTRIL ) 40 MG tablet   Has the patient contacted their pharmacy? Yes (Agent: If no, request that the patient contact the pharmacy for the refill. If patient does not wish to contact the pharmacy document the reason why and proceed with request.) (Agent: If yes, when and what did the pharmacy advise?)  This is the patient's preferred pharmacy:  [facility] [address] ([address]), [place] - [address] [place]) [place] [address] Phone: [phone] Fax: [phone]  Is this the correct pharmacy for this prescription? Yes If no, delete pharmacy and type the correct one.   Has the prescription been filled recently? Yes  Is the patient out of the medication? Yes  Has the patient been seen for an appointment in the last year OR does the patient have an upcoming appointment? Yes  Can we respond through MyChart? No please call [phone]  Agent: Please be advised that Rx refills may take up to 3 business days. We ask that you follow-up with your pharmacy.

## 2024-11-04 NOTE — Telephone Encounter (Signed)
 Need to call pharmacy - pt should have refills

## 2024-11-05 NOTE — Telephone Encounter (Signed)
 Called pharmacy to verify current refills. No answer from pharmacy.

## 2024-11-05 NOTE — Telephone Encounter (Signed)
 Requested medication (s) are due for refill today: no  Requested medication (s) are on the active medication list: yes  Last refill:  07/23/24, 90/1 RF  Future visit scheduled: {Yes  Notes to clinic:  too soon for refill, called pharmacy to verify refill but no answer. Routing for review.     Requested Prescriptions  Pending Prescriptions Disp Refills   levothyroxine  (SYNTHROID ) 75 MCG tablet 90 tablet 4    Sig: Take 1 tablet (75 mcg total) by mouth daily before breakfast.     Endocrinology:  Hypothyroid Agents Passed - 11/05/2024 10:09 AM      Passed - TSH in normal range and within 360 days    TSH  Date Value Ref Range Status  07/23/2024 1.070 0.450 - 4.500 uIU/mL Final         Passed - Valid encounter within last 12 months    Recent Outpatient Visits           3 months ago Atrial fibrillation with RVR (HCC)   [place] [facility] [place], Jolene T, NP               lisinopril  (ZESTRIL ) 40 MG tablet 90 tablet 4    Sig: Take 1 tablet (40 mg total) by mouth daily.     Cardiovascular:  ACE Inhibitors Passed - 11/05/2024 10:09 AM      Passed - Cr in normal range and within 180 days    Creatinine, Ser  Date Value Ref Range Status  07/23/2024 0.97 0.57 - 1.00 mg/dL Final         Passed - K in normal range and within 180 days    Potassium  Date Value Ref Range Status  07/23/2024 3.8 3.5 - 5.2 mmol/L Final         Passed - Patient is not pregnant      Passed - Last BP in normal range    BP Readings from Last 1 Encounters:  07/23/24 129/77         Passed - Valid encounter within last 6 months    Recent Outpatient Visits           3 months ago Atrial fibrillation with RVR (HCC)   [place] [place], Jolene T, NP               amLODipine  (NORVASC ) 10 MG tablet 90 tablet 4    Sig: Take 1 tablet (10 mg total) by mouth every morning.     Cardiovascular: Calcium Channel Blockers 2 Passed - 11/05/2024 10:09 AM       Passed - Last BP in normal range    BP Readings from Last 1 Encounters:  07/23/24 129/77         Passed - Last Heart Rate in normal range    Pulse Readings from Last 1 Encounters:  07/23/24 60         Passed - Valid encounter within last 6 months    Recent Outpatient Visits           3 months ago Atrial fibrillation with RVR (HCC)   [place] [place] [facility] [place], Melanie DASEN, NP

## 2024-11-09 ENCOUNTER — Ambulatory Visit: Payer: Self-pay

## 2024-12-02 ENCOUNTER — Other Ambulatory Visit: Payer: Self-pay | Admitting: Nurse Practitioner

## 2024-12-02 DIAGNOSIS — I10 Essential (primary) hypertension: Secondary | ICD-10-CM

## 2024-12-02 NOTE — Telephone Encounter (Signed)
 Copied from CRM #[id number]. Topic: Clinical - Medication Refill >> Dec 02, 2024 11:17 AM Antwanette L wrote: Medication: lisinopril  (ZESTRIL ) 40 MG tablet  Has the patient contacted their pharmacy? Yes. The pharmacy automated system told the pt the script has expired and a new one is needed.   This is the patient's preferred pharmacy:  [facility] [address] ([address]), [place] - [address] [place]) [place] [address] Phone: [phone] Fax: [phone]  Is this the correct pharmacy for this prescription? Yes    Has the prescription been filled recently? Yes. Last refill was 07/23/24  Is the patient out of the medication? No. Patient has enough   Has the patient been seen for an appointment in the last year OR does the patient have an upcoming appointment? Yes. Last office visit with Jolene Cannady was 07/23/24. Next appt is 02/04/25  Can we respond through MyChart? No. The patient can be reached at [phone]  Agent: Please be advised that Rx refills may take up to 3 business days. We ask that you follow-up with your pharmacy. >> Dec 02, 2024 11:23 AM Antwanette L wrote: The patient would like to pick up the medicine as soon as possible. There is a possible snow storm this weekend.

## 2024-12-03 MED ORDER — LISINOPRIL 40 MG PO TABS: 40.0000 mg | ORAL_TABLET | Freq: Every day | ORAL | 0 refills | Status: AC

## 2025-02-04 ENCOUNTER — Encounter: Admitting: Nurse Practitioner
